# Patient Record
Sex: Male | Born: 1965 | Race: Black or African American | Hispanic: No | Marital: Married | Smoking: Never smoker
Health system: Southern US, Community
[De-identification: ages and names within clinical notes are randomized; demographics above are authoritative.]

## PROBLEM LIST (undated history)

## (undated) VITALS — BP 123/91 | HR 86 | Temp 98.5°F | Resp 14 | Ht 67.0 in | Wt 218.0 lb

## (undated) DIAGNOSIS — L0390 Cellulitis, unspecified: Secondary | ICD-10-CM

## (undated) DIAGNOSIS — F1020 Alcohol dependence, uncomplicated: Secondary | ICD-10-CM

## (undated) DIAGNOSIS — F32A Depression, unspecified: Secondary | ICD-10-CM

## (undated) DIAGNOSIS — R000 Tachycardia, unspecified: Secondary | ICD-10-CM

## (undated) DIAGNOSIS — Z973 Presence of spectacles and contact lenses: Secondary | ICD-10-CM

## (undated) DIAGNOSIS — G4730 Sleep apnea, unspecified: Secondary | ICD-10-CM

## (undated) DIAGNOSIS — F1410 Cocaine abuse, uncomplicated: Secondary | ICD-10-CM

## (undated) DIAGNOSIS — E785 Hyperlipidemia, unspecified: Secondary | ICD-10-CM

## (undated) DIAGNOSIS — E119 Type 2 diabetes mellitus without complications: Secondary | ICD-10-CM

## (undated) DIAGNOSIS — R45851 Suicidal ideations: Secondary | ICD-10-CM

## (undated) DIAGNOSIS — I10 Essential (primary) hypertension: Secondary | ICD-10-CM

## (undated) DIAGNOSIS — N471 Phimosis: Secondary | ICD-10-CM

## (undated) DIAGNOSIS — G4733 Obstructive sleep apnea (adult) (pediatric): Secondary | ICD-10-CM

## (undated) DIAGNOSIS — N289 Disorder of kidney and ureter, unspecified: Secondary | ICD-10-CM

## (undated) DIAGNOSIS — Z8659 Personal history of other mental and behavioral disorders: Secondary | ICD-10-CM

## (undated) DIAGNOSIS — H6993 Unspecified Eustachian tube disorder, bilateral: Secondary | ICD-10-CM

## (undated) DIAGNOSIS — K219 Gastro-esophageal reflux disease without esophagitis: Secondary | ICD-10-CM

## (undated) DIAGNOSIS — I700 Atherosclerosis of aorta: Secondary | ICD-10-CM

## (undated) DIAGNOSIS — N401 Enlarged prostate with lower urinary tract symptoms: Secondary | ICD-10-CM

## (undated) DIAGNOSIS — C61 Malignant neoplasm of prostate: Secondary | ICD-10-CM

## (undated) DIAGNOSIS — N492 Inflammatory disorders of scrotum: Secondary | ICD-10-CM

## (undated) DIAGNOSIS — N3946 Mixed incontinence: Secondary | ICD-10-CM

## (undated) DIAGNOSIS — F1414 Cocaine abuse with cocaine-induced mood disorder: Secondary | ICD-10-CM

## (undated) DIAGNOSIS — E7800 Pure hypercholesterolemia, unspecified: Secondary | ICD-10-CM

## (undated) DIAGNOSIS — F329 Major depressive disorder, single episode, unspecified: Secondary | ICD-10-CM

## (undated) HISTORY — PX: TONSILLECTOMY: SUR1361

## (undated) HISTORY — PX: PROSTATE BIOPSY: SHX241

---

## 2011-08-19 ENCOUNTER — Encounter (HOSPITAL_COMMUNITY): Payer: Self-pay | Admitting: *Deleted

## 2011-08-19 ENCOUNTER — Observation Stay (HOSPITAL_COMMUNITY)
Admission: EM | Admit: 2011-08-19 | Discharge: 2011-08-20 | Disposition: A | Discharge status: Home Health | Payer: Self-pay | Attending: Emergency Medicine | Admitting: Emergency Medicine

## 2011-08-19 DIAGNOSIS — G4730 Sleep apnea, unspecified: Secondary | ICD-10-CM | POA: Insufficient documentation

## 2011-08-19 DIAGNOSIS — Z8669 Personal history of other diseases of the nervous system and sense organs: Secondary | ICD-10-CM

## 2011-08-19 DIAGNOSIS — E119 Type 2 diabetes mellitus without complications: Principal | ICD-10-CM | POA: Insufficient documentation

## 2011-08-19 DIAGNOSIS — R739 Hyperglycemia, unspecified: Secondary | ICD-10-CM

## 2011-08-19 HISTORY — DX: Sleep apnea, unspecified: G47.30

## 2011-08-19 LAB — POCT I-STAT, CHEM 8
BUN: 10 mg/dL (ref 6–23)
Calcium, Ion: 1.12 mmol/L (ref 1.12–1.23)
Chloride: 94 mEq/L — ABNORMAL LOW (ref 96–112)
Glucose, Bld: 355 mg/dL — ABNORMAL HIGH (ref 70–99)
Potassium: 4.3 mEq/L (ref 3.5–5.1)

## 2011-08-19 LAB — CBC WITH DIFFERENTIAL/PLATELET
Basophils Relative: 0 % (ref 0–1)
HCT: 43.6 % (ref 39.0–52.0)
Hemoglobin: 15.3 g/dL (ref 13.0–17.0)
Lymphocytes Relative: 34 % (ref 12–46)
Lymphs Abs: 2.6 10*3/uL (ref 0.7–4.0)
MCHC: 35.1 g/dL (ref 30.0–36.0)
Monocytes Absolute: 0.8 10*3/uL (ref 0.1–1.0)
Monocytes Relative: 10 % (ref 3–12)
Neutro Abs: 4.1 10*3/uL (ref 1.7–7.7)
RBC: 5.1 MIL/uL (ref 4.22–5.81)

## 2011-08-19 LAB — BASIC METABOLIC PANEL
BUN: 9 mg/dL (ref 6–23)
CO2: 29 mEq/L (ref 19–32)
Chloride: 92 mEq/L — ABNORMAL LOW (ref 96–112)
Creatinine, Ser: 1.02 mg/dL (ref 0.50–1.35)
Glucose, Bld: 378 mg/dL — ABNORMAL HIGH (ref 70–99)

## 2011-08-19 LAB — GLUCOSE, CAPILLARY

## 2011-08-19 NOTE — ED Notes (Signed)
Patient updated on status.  Apology for wait.

## 2011-08-19 NOTE — ED Notes (Addendum)
Pt states he has been out of his Novolog medications for the past 2 and a half weeks. Pt states that he was fighting a cold since about that long too. Pt states that he was unable to check CBG's at home and does not have a PCP to refill meds.

## 2011-08-20 LAB — URINALYSIS, ROUTINE W REFLEX MICROSCOPIC
Bilirubin Urine: NEGATIVE
Glucose, UA: >1000 mg/dL — AB
Ketones, ur: 40 mg/dL — AB
Specific Gravity, Urine: 1.041 — ABNORMAL HIGH (ref 1.005–1.030)
pH: 6 (ref 5.0–8.0)

## 2011-08-20 LAB — GLUCOSE, CAPILLARY
Glucose-Capillary: 337 mg/dL — ABNORMAL HIGH (ref 70–99)
Glucose-Capillary: 185 mg/dL — ABNORMAL HIGH (ref 70–99)

## 2011-08-20 LAB — URINE MICROSCOPIC-ADD ON

## 2011-08-20 MED ORDER — SODIUM CHLORIDE 0.9 % IV SOLN
1000.0000 mL | Freq: Once | INTRAVENOUS | Status: AC
  Administered 2011-08-20: 1000 mL via INTRAVENOUS

## 2011-08-20 MED ORDER — DEXTROSE-NACL 5-0.45 % IV SOLN: INTRAVENOUS | Status: DC

## 2011-08-20 MED ORDER — SODIUM CHLORIDE 0.9 % IV SOLN
1000.0000 mL | INTRAVENOUS | Status: DC
  Administered 2011-08-20: 1000 mL via INTRAVENOUS

## 2011-08-20 MED ORDER — SODIUM CHLORIDE 0.9 % IV SOLN
INTRAVENOUS | Status: DC
  Administered 2011-08-20: 6 [IU]/h via INTRAVENOUS
  Filled 2011-08-20: qty 1

## 2011-08-20 MED ORDER — SODIUM CHLORIDE 0.9 % IV BOLUS (SEPSIS)
1000.0000 mL | Freq: Once | INTRAVENOUS | Status: AC
  Administered 2011-08-20: 1000 mL via INTRAVENOUS

## 2011-08-20 MED ORDER — INSULIN REGULAR BOLUS VIA INFUSION
0.0000 [IU] | Freq: Three times a day (TID) | INTRAVENOUS | Status: DC
  Filled 2011-08-20: qty 10

## 2011-08-20 MED ORDER — DEXTROSE 50 % IV SOLN: 25.0000 mL | INTRAVENOUS | Status: DC | PRN

## 2011-08-20 MED ORDER — INSULIN ASPART 100 UNIT/ML ~~LOC~~ SOLN: SUBCUTANEOUS | Status: DC

## 2011-08-20 MED ORDER — POTASSIUM CHLORIDE CRYS ER 20 MEQ PO TBCR
20.0000 meq | EXTENDED_RELEASE_TABLET | Freq: Two times a day (BID) | ORAL | Status: DC
  Administered 2011-08-20 (×2): 20 meq via ORAL
  Filled 2011-08-20 (×2): qty 1

## 2011-08-20 MED ORDER — SODIUM CHLORIDE 0.9 % IV SOLN: INTRAVENOUS | Status: DC

## 2011-08-20 MED ORDER — SODIUM CHLORIDE 0.9 % IV SOLN
INTRAVENOUS | Status: DC
  Filled 2011-08-20: qty 1

## 2011-08-20 MED ORDER — IBUPROFEN 800 MG PO TABS
800.0000 mg | ORAL_TABLET | Freq: Once | ORAL | Status: AC
  Administered 2011-08-20: 800 mg via ORAL

## 2011-08-20 MED ORDER — IBUPROFEN 800 MG PO TABS
ORAL_TABLET | ORAL | Status: AC
  Filled 2011-08-20: qty 1

## 2011-08-20 NOTE — ED Notes (Signed)
CBG at 204.  Case worker at bedside

## 2011-08-20 NOTE — ED Notes (Signed)
Case worker submitting RX to pharmacy for 3 day supply of insulin for pt

## 2011-08-20 NOTE — ED Notes (Signed)
Pt moved to CDU # 10.  Family member at bedside.  Pt denies any pain. Awaiting case management to assist with RX. Pt drinking water without difficulty

## 2011-08-20 NOTE — ED Notes (Signed)
Pt unable to void 

## 2011-08-20 NOTE — ED Notes (Signed)
Family at bedside. 

## 2011-08-20 NOTE — ED Notes (Signed)
Pt amb to BR to wash face and clean up.  States he feels good. Ate 60% of breakfast

## 2011-08-20 NOTE — ED Notes (Signed)
Patient here for elevated blood sugar.  Patient states that he also has some pain around the head of his penis.  Patient has swelling to area.

## 2011-08-20 NOTE — ED Notes (Signed)
Meal given to patient and family member while awaiting consult

## 2011-08-20 NOTE — Progress Notes (Signed)
   CARE MANAGEMENT NOTE 08/20/2011  Patient:  Jonathan Austin, Jonathan Austin   Account Number:  [id number]  Date Initiated:  08/20/2011  Documentation initiated by:  [facility]  Subjective/Objective Assessment:   DM     Action/Plan:   Anticipated DC Date:  08/20/2011   Anticipated DC Plan:  HOME/SELF CARE      DC Planning Services  CM consult  [facility]  Medication Assistance      Choice offered to / List presented to:             Status of service:  Completed, signed off Medicare Important Message given?   (If response is "NO", the following Medicare IM given date fields will be blank) Date Medicare IM given:   Date Additional Medicare IM given:    Discharge Disposition:  HOME/SELF CARE  Per UR Regulation:    If discussed at Long Length of Stay Meetings, dates discussed:    Comments:  08/20/2011 0930 Spoke to pt and states he is self-employed. Was released from prison in 2011 and currently does not have any insurance coverage. 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 He has glucometer at home that was provided to him while incarcerated. He does not have a primary care physician at this time. Provided pt with community resources such as [facility], [employer], and [employer]. Gave him a community discount card that may assist with meds he pays for out of pocket. Pt states he had CPAP while in prison for OSA, but it belong to the state so he had to leave device. Explained that once he is established with PCP, they will make appropriate referrals to help. Encouraged him to schedule appt with [facility] as soon as possible to prevent in relapses in care and compliance with meds. Pt verbalized understanding and stated he will follow up. Isidoro Donning RN CCM Case Mgmt phone [phone]

## 2011-08-20 NOTE — ED Provider Notes (Signed)
History     CSN: [id number]  Arrival date & time 08/19/11  1900   First MD Initiated Contact with Patient 08/20/11 0009      Chief Complaint  Patient presents with  . Hyperglycemia    (Consider location/radiation/quality/duration/timing/severity/associated sxs/prior treatment) HPI History provided by patient. Has history of insulin-dependent diabetes. Was incarcerated and since that time has not had a primary care physician or access to refills on his medications. He is ran out of NovoLog about 2 weeks ago and presents requesting a prescription. He is unable to afford medications and is asking for any assistance that he can receive. No fevers or chills. No nausea vomiting or diarrhea. Had some cough and cold last week but feels like that is getting better. No difficulty breathing. No chest pain. No abdominal pain. Blood sugars have become elevated. Moderate severity. Has not tried to contact any clinics to establish primary care. Past Medical History  Diagnosis Date  . Diabetes mellitus   . Sleep apnea     History reviewed. No pertinent past surgical history.  History reviewed. No pertinent family history.  History  Substance Use Topics  . Smoking status: Never Smoker   . Smokeless tobacco: Not on file  . Alcohol Use: Yes      Review of Systems  Constitutional: Negative for fever and chills.  HENT: Negative for neck pain and neck stiffness.   Eyes: Negative for pain.  Respiratory: Negative for shortness of breath and wheezing.   Cardiovascular: Negative for chest pain.  Gastrointestinal: Negative for abdominal pain.  Genitourinary: Negative for dysuria.  Musculoskeletal: Negative for back pain.  Skin: Negative for rash.  Neurological: Negative for headaches.  All other systems reviewed and are negative.    Allergies  Review of patient's allergies indicates no known allergies.  Home Medications   Current Outpatient Rx  Name Route Sig Dispense Refill  . INSULIN  ASPART 100 UNIT/ML [place] SOLN  Use sliding scale as directed 1 vial 12    BP 131/75  Pulse 101  Temp 97 F (36.1 C) (Oral)  Resp 18  SpO2 95%  Physical Exam  Constitutional: He is oriented to person, place, and time. He appears well-developed and well-nourished.  HENT:  Head: Normocephalic and atraumatic.  Eyes: Conjunctivae and EOM are normal. Pupils are equal, round, and reactive to light.  Neck: Trachea normal. Neck supple. No thyromegaly present.  Cardiovascular: Normal rate, regular rhythm, S1 normal, S2 normal and normal pulses.     No systolic murmur is present   No diastolic murmur is present  Pulses:      Radial pulses are 2+ on the right side, and 2+ on the left side.  Pulmonary/Chest: Effort normal and breath sounds normal. He has no wheezes. He has no rhonchi. He has no rales. He exhibits no tenderness.  Abdominal: Soft. Normal appearance and bowel sounds are normal. There is no tenderness. There is no CVA tenderness and negative Murphy's sign.  Musculoskeletal:       BLE:s Calves nontender, no cords or erythema, negative Homans sign  Neurological: He is alert and oriented to person, place, and time. He has normal strength. No cranial nerve deficit or sensory deficit. GCS eye subscore is 4. GCS verbal subscore is 5. GCS motor subscore is 6.  Skin: Skin is warm and dry. No rash noted. He is not diaphoretic.  Psychiatric: His speech is normal.       Cooperative and appropriate    ED Course  Procedures (  including critical care time)  Labs Reviewed  GLUCOSE, CAPILLARY - Abnormal; Notable for the following:    Glucose-Capillary 369 (*)     All other components within normal limits  BASIC METABOLIC PANEL - Abnormal; Notable for the following:    Sodium 132 (*)     Chloride 92 (*)     Glucose, Bld 378 (*)     GFR calc non Af Amer 86 (*)     All other components within normal limits  URINALYSIS, ROUTINE W REFLEX MICROSCOPIC - Abnormal; Notable for the following:     Specific Gravity, Urine 1.041 (*)     Glucose, UA >1000 (*)     Ketones, ur 40 (*)     All other components within normal limits  POCT I-STAT, CHEM 8 - Abnormal; Notable for the following:    Sodium 133 (*)     Chloride 94 (*)     Glucose, Bld 355 (*)     All other components within normal limits  GLUCOSE, CAPILLARY - Abnormal; Notable for the following:    Glucose-Capillary 337 (*)     All other components within normal limits  GLUCOSE, CAPILLARY - Abnormal; Notable for the following:    Glucose-Capillary 268 (*)     All other components within normal limits  GLUCOSE, CAPILLARY - Abnormal; Notable for the following:    Glucose-Capillary 219 (*)     All other components within normal limits  GLUCOSE, CAPILLARY - Abnormal; Notable for the following:    Glucose-Capillary 185 (*)     All other components within normal limits  URINE MICROSCOPIC-ADD ON - Abnormal; Notable for the following:    Squamous Epithelial / LPF FEW (*)     All other components within normal limits  CBC WITH DIFFERENTIAL   IV insulin and IV fluids provided to normalize blood sugar. Labs obtained and reviewed as above.  Plan social work consult at 8 AM for referrals and Rx assistance. Patient given resource guide and states understanding that he needs to call clinics to establish primary care. MDM   Hyperglycemia due to ran out of insulin. Rx provided and social work consult for medication assist. No indication for admission or further workup in the emergency department at this time. Nursing notes reviewed. Vital signs reviewed with normalizing heart rate.        Sunnie Nielsen, MD 08/20/11 [phone]

## 2011-08-20 NOTE — ED Notes (Signed)
Pt ride home has arrived and is sitting with patient.  Awaiting RX from [employer]

## 2011-08-20 NOTE — ED Notes (Signed)
Case worker called to say that she is on her way to see patient.

## 2011-08-20 NOTE — ED Notes (Signed)
Report received from Diona Foley, RN

## 2011-08-23 ENCOUNTER — Emergency Department (HOSPITAL_COMMUNITY)
Admission: EM | Admit: 2011-08-23 | Discharge: 2011-08-24 | Disposition: A | Discharge status: Home / Self Care | Payer: Self-pay | Attending: Emergency Medicine | Admitting: Emergency Medicine

## 2011-08-23 ENCOUNTER — Encounter (HOSPITAL_COMMUNITY): Payer: Self-pay | Admitting: Emergency Medicine

## 2011-08-23 DIAGNOSIS — N476 Balanoposthitis: Secondary | ICD-10-CM | POA: Insufficient documentation

## 2011-08-23 DIAGNOSIS — E119 Type 2 diabetes mellitus without complications: Secondary | ICD-10-CM | POA: Insufficient documentation

## 2011-08-23 DIAGNOSIS — G4730 Sleep apnea, unspecified: Secondary | ICD-10-CM | POA: Insufficient documentation

## 2011-08-23 DIAGNOSIS — N390 Urinary tract infection, site not specified: Secondary | ICD-10-CM

## 2011-08-23 DIAGNOSIS — R739 Hyperglycemia, unspecified: Secondary | ICD-10-CM

## 2011-08-23 DIAGNOSIS — Z794 Long term (current) use of insulin: Secondary | ICD-10-CM | POA: Insufficient documentation

## 2011-08-23 LAB — CBC WITH DIFFERENTIAL/PLATELET
Basophils Absolute: 0.1 10*3/uL (ref 0.0–0.1)
Basophils Relative: 1 % (ref 0–1)
Eosinophils Absolute: 0.1 10*3/uL (ref 0.0–0.7)
HCT: 41.9 % (ref 39.0–52.0)
Hemoglobin: 14.4 g/dL (ref 13.0–17.0)
Lymphocytes Relative: 25 % (ref 12–46)
MCHC: 34.4 g/dL (ref 30.0–36.0)
Monocytes Relative: 9 % (ref 3–12)
Neutro Abs: 5.1 10*3/uL (ref 1.7–7.7)
Neutrophils Relative %: 64 % (ref 43–77)
RDW: 13.6 % (ref 11.5–15.5)
WBC: 8 10*3/uL (ref 4.0–10.5)

## 2011-08-23 LAB — POCT I-STAT, CHEM 8
Chloride: 101 mEq/L (ref 96–112)
Glucose, Bld: 340 mg/dL — ABNORMAL HIGH (ref 70–99)
HCT: 45 % (ref 39.0–52.0)
Hemoglobin: 15.3 g/dL (ref 13.0–17.0)
Potassium: 4.5 mEq/L (ref 3.5–5.1)
Sodium: 138 mEq/L (ref 135–145)

## 2011-08-23 LAB — URINE MICROSCOPIC-ADD ON

## 2011-08-23 LAB — URINALYSIS, ROUTINE W REFLEX MICROSCOPIC
Glucose, UA: >1000 mg/dL — AB
Nitrite: NEGATIVE
pH: 6 (ref 5.0–8.0)

## 2011-08-23 LAB — GLUCOSE, CAPILLARY: Glucose-Capillary: 285 mg/dL — ABNORMAL HIGH (ref 70–99)

## 2011-08-23 MED ORDER — INSULIN ASPART 100 UNIT/ML ~~LOC~~ SOLN: 5.0000 [IU] | Freq: Once | SUBCUTANEOUS | Status: DC

## 2011-08-23 MED ORDER — INSULIN ASPART 100 UNIT/ML ~~LOC~~ SOLN
4.0000 [IU] | Freq: Once | SUBCUTANEOUS | Status: AC
  Administered 2011-08-24: 4 [IU] via INTRAVENOUS

## 2011-08-23 MED ORDER — SODIUM CHLORIDE 0.9 % IV BOLUS (SEPSIS)
1000.0000 mL | Freq: Once | INTRAVENOUS | Status: AC
  Administered 2011-08-24: 1000 mL via INTRAVENOUS

## 2011-08-23 MED ORDER — DEXTROSE 5 % IV SOLN
1.0000 g | Freq: Once | INTRAVENOUS | Status: AC
  Administered 2011-08-24: 1 g via INTRAVENOUS
  Filled 2011-08-23: qty 10

## 2011-08-23 MED ORDER — ACYCLOVIR 200 MG PO CAPS
800.0000 mg | ORAL_CAPSULE | Freq: Once | ORAL | Status: AC
  Administered 2011-08-24: 800 mg via ORAL
  Filled 2011-08-23: qty 4

## 2011-08-23 NOTE — ED Provider Notes (Addendum)
History     CSN: [id number]  Arrival date & time 08/23/11  2156   First MD Initiated Contact with Patient 08/23/11 2317      Chief Complaint  Patient presents with  . Dysuria    (Consider location/radiation/quality/duration/timing/severity/associated sxs/prior treatment) Patient is a 46 y.o. male presenting with dysuria. The history is provided by the patient.  Dysuria  This is a new problem. The current episode started yesterday. The problem occurs every urination. The problem has not changed since onset.The quality of the pain is described as burning. The pain is at a severity of 6/10. The pain is mild. There has been no fever. Past medical history comments: diabetes.    Past Medical History  Diagnosis Date  . Diabetes mellitus   . Sleep apnea     Past Surgical History  Procedure Date  . Tonsillectomy     History reviewed. No pertinent family history.  History  Substance Use Topics  . Smoking status: Never Smoker   . Smokeless tobacco: Not on file  . Alcohol Use: Yes      Review of Systems  Genitourinary: Positive for dysuria.  All other systems reviewed and are negative.    Allergies  Review of patient's allergies indicates no known allergies.  Home Medications   Current Outpatient Rx  Name Route Sig Dispense Refill  . IBUPROFEN 200 MG PO TABS Oral Take 400 mg by mouth every 8 (eight) hours as needed. For pain.    . INSULIN ASPART 100 UNIT/ML [place] SOLN Subcutaneous Inject 4-12 Units into the skin 2 (two) times daily before a meal.      BP 153/88  Pulse 115  Temp 97.8 F (36.6 C) (Oral)  Resp 20  SpO2 96%  Physical Exam  Constitutional: He is oriented to person, place, and time. He appears well-developed and well-nourished.  HENT:  Head: Normocephalic and atraumatic.  Eyes: Conjunctivae are normal. Pupils are equal, round, and reactive to light.  Neck: Normal range of motion. Neck supple.  Cardiovascular: Normal rate, regular rhythm, normal  heart sounds and intact distal pulses.   Pulmonary/Chest: Effort normal and breath sounds normal.  Abdominal: Soft. Bowel sounds are normal.  Genitourinary:          Pt with mild balanitis,  Able to retract foreskin. Also ulcerations,  vesicular in nature noted.  No swelling,  No crepitus,  No scrotal involvement  Neurological: He is alert and oriented to person, place, and time.  Skin: Skin is warm and dry.  Psychiatric: He has a normal mood and affect. His behavior is normal. Judgment and thought content normal.    ED Course  Procedures (including critical care time)  Labs Reviewed  GLUCOSE, CAPILLARY - Abnormal; Notable for the following:    Glucose-Capillary 285 (*)     All other components within normal limits  URINALYSIS, ROUTINE W REFLEX MICROSCOPIC - Abnormal; Notable for the following:    APPearance CLOUDY (*)     Glucose, UA >1000 (*)     Hgb urine dipstick SMALL (*)     Leukocytes, UA MODERATE (*)     All other components within normal limits  POCT I-STAT, CHEM 8 - Abnormal; Notable for the following:    Glucose, Bld 340 (*)     All other components within normal limits  CBC WITH DIFFERENTIAL  URINE MICROSCOPIC-ADD ON   No results found.   No diagnosis found.    MDM  PT with balanitis.  Also ulcerations.  Not  fourniers.  Possible uti with erosion vs herpes outbreak.  No dka.  Will treat sugar.  Empiric abx, urine culture, antiviral.  Established with case management outpt fu.  Urology for possible circumcision as outpt        Stepen Prins Lytle Michaels, MD 08/23/11 2354  Chanele Douglas Lytle Michaels, MD 08/24/11 1610

## 2011-08-23 NOTE — ED Notes (Addendum)
Reports that when blood sugar, he gets real dehydrated and when that happens- he is not circumcised and the top of his penis gets tight and blisters and has pus under foreskin; pt reports normally goes away , but this time has not; reports pain with urination as well

## 2011-08-23 NOTE — ED Notes (Signed)
CBG checked 285

## 2011-08-24 LAB — GLUCOSE, CAPILLARY: Glucose-Capillary: 264 mg/dL — ABNORMAL HIGH (ref 70–99)

## 2011-08-24 MED ORDER — INSULIN ASPART 100 UNIT/ML ~~LOC~~ SOLN
SUBCUTANEOUS | Status: AC
  Filled 2011-08-24: qty 1

## 2011-08-24 MED ORDER — MORPHINE SULFATE 4 MG/ML IJ SOLN
INTRAMUSCULAR | Status: AC
  Filled 2011-08-24: qty 1

## 2011-08-24 MED ORDER — ONDANSETRON HCL 4 MG/2ML IJ SOLN
INTRAMUSCULAR | Status: AC
  Filled 2011-08-24: qty 2

## 2011-08-24 MED ORDER — MORPHINE SULFATE 4 MG/ML IJ SOLN
4.0000 mg | Freq: Once | INTRAMUSCULAR | Status: AC
  Administered 2011-08-24: 4 mg via INTRAVENOUS

## 2011-08-24 MED ORDER — ONDANSETRON HCL 4 MG/2ML IJ SOLN
4.0000 mg | Freq: Once | INTRAMUSCULAR | Status: AC
  Administered 2011-08-24: 4 mg via INTRAVENOUS

## 2011-08-24 MED ORDER — CIPROFLOXACIN HCL 500 MG PO TABS: 500.0000 mg | ORAL_TABLET | Freq: Two times a day (BID) | ORAL | Status: AC

## 2011-08-24 MED ORDER — HYDROCODONE-ACETAMINOPHEN 5-500 MG PO TABS: 1.0000 | ORAL_TABLET | Freq: Four times a day (QID) | ORAL | Status: AC | PRN

## 2011-08-24 MED ORDER — ACYCLOVIR 400 MG PO TABS: 400.0000 mg | ORAL_TABLET | Freq: Four times a day (QID) | ORAL | Status: AC

## 2011-08-24 MED FILL — Insulin Aspart Inj 100 Unit/ML: SUBCUTANEOUS | Qty: 0.04 | Status: AC

## 2011-08-24 NOTE — ED Notes (Signed)
CBG completed 

## 2012-08-02 ENCOUNTER — Emergency Department (HOSPITAL_COMMUNITY): Payer: 59

## 2012-08-02 ENCOUNTER — Encounter (HOSPITAL_COMMUNITY): Payer: Self-pay | Admitting: *Deleted

## 2012-08-02 ENCOUNTER — Emergency Department (HOSPITAL_COMMUNITY)
Admission: EM | Admit: 2012-08-02 | Discharge: 2012-08-02 | Disposition: A | Discharge status: Home / Self Care | Payer: 59 | Attending: Emergency Medicine | Admitting: Emergency Medicine

## 2012-08-02 DIAGNOSIS — R5383 Other fatigue: Secondary | ICD-10-CM | POA: Insufficient documentation

## 2012-08-02 DIAGNOSIS — R3589 Other polyuria: Secondary | ICD-10-CM | POA: Insufficient documentation

## 2012-08-02 DIAGNOSIS — E1169 Type 2 diabetes mellitus with other specified complication: Secondary | ICD-10-CM | POA: Insufficient documentation

## 2012-08-02 DIAGNOSIS — R5381 Other malaise: Secondary | ICD-10-CM | POA: Insufficient documentation

## 2012-08-02 DIAGNOSIS — R358 Other polyuria: Secondary | ICD-10-CM | POA: Insufficient documentation

## 2012-08-02 DIAGNOSIS — Z79899 Other long term (current) drug therapy: Secondary | ICD-10-CM | POA: Insufficient documentation

## 2012-08-02 DIAGNOSIS — F1410 Cocaine abuse, uncomplicated: Secondary | ICD-10-CM

## 2012-08-02 DIAGNOSIS — J40 Bronchitis, not specified as acute or chronic: Secondary | ICD-10-CM

## 2012-08-02 DIAGNOSIS — R05 Cough: Secondary | ICD-10-CM | POA: Insufficient documentation

## 2012-08-02 DIAGNOSIS — R739 Hyperglycemia, unspecified: Secondary | ICD-10-CM

## 2012-08-02 DIAGNOSIS — R631 Polydipsia: Secondary | ICD-10-CM | POA: Insufficient documentation

## 2012-08-02 DIAGNOSIS — Z794 Long term (current) use of insulin: Secondary | ICD-10-CM | POA: Insufficient documentation

## 2012-08-02 DIAGNOSIS — R059 Cough, unspecified: Secondary | ICD-10-CM | POA: Insufficient documentation

## 2012-08-02 LAB — URINE MICROSCOPIC-ADD ON

## 2012-08-02 LAB — COMPREHENSIVE METABOLIC PANEL
ALT: 19 U/L (ref 0–53)
Albumin: 3.6 g/dL (ref 3.5–5.2)
Alkaline Phosphatase: 65 U/L (ref 39–117)
Potassium: 4.8 mEq/L (ref 3.5–5.1)
Sodium: 135 mEq/L (ref 135–145)
Total Protein: 7.6 g/dL (ref 6.0–8.3)

## 2012-08-02 LAB — CBC
MCHC: 35.5 g/dL (ref 30.0–36.0)
Platelets: 328 10*3/uL (ref 150–400)
RDW: 12.8 % (ref 11.5–15.5)

## 2012-08-02 LAB — URINALYSIS, ROUTINE W REFLEX MICROSCOPIC
Bilirubin Urine: NEGATIVE
Glucose, UA: >1000 mg/dL — AB
Ketones, ur: 15 mg/dL — AB
Leukocytes, UA: NEGATIVE
pH: 6 (ref 5.0–8.0)

## 2012-08-02 LAB — GLUCOSE, CAPILLARY: Glucose-Capillary: 251 mg/dL — ABNORMAL HIGH (ref 70–99)

## 2012-08-02 MED ORDER — METFORMIN HCL 500 MG PO TABS: 500.0000 mg | ORAL_TABLET | Freq: Two times a day (BID) | ORAL | Status: DC

## 2012-08-02 MED ORDER — SODIUM CHLORIDE 0.9 % IV BOLUS (SEPSIS)
1000.0000 mL | Freq: Once | INTRAVENOUS | Status: AC
  Administered 2012-08-02: 1000 mL via INTRAVENOUS

## 2012-08-02 MED ORDER — ALBUTEROL SULFATE HFA 108 (90 BASE) MCG/ACT IN AERS: 2.0000 | INHALATION_SPRAY | RESPIRATORY_TRACT | Status: DC | PRN

## 2012-08-02 MED ORDER — AZITHROMYCIN 250 MG PO TABS: ORAL_TABLET | ORAL | Status: DC

## 2012-08-02 MED ORDER — INSULIN ASPART 100 UNIT/ML ~~LOC~~ SOLN
5.0000 [IU] | Freq: Once | SUBCUTANEOUS | Status: AC
  Administered 2012-08-02: 5 [IU] via SUBCUTANEOUS

## 2012-08-02 MED ORDER — GLIPIZIDE 10 MG PO TABS: 10.0000 mg | ORAL_TABLET | Freq: Two times a day (BID) | ORAL | Status: DC

## 2012-08-02 NOTE — ED Provider Notes (Signed)
History    CSN: [id number] Arrival date & time 08/02/12  1713  First MD Initiated Contact with Patient 08/02/12 1810     Chief Complaint  Patient presents with  . Hyperglycemia   (Consider location/radiation/quality/duration/timing/severity/associated sxs/prior Treatment) HPI Comments: Patient presents to ER for evaluation of elevated blood sugar. Patient reports that his sugar has been running high for a while. He has been weak, experiencing increased urination and thirst. Patient has not had any recent illness other than a cough that was productive of dark sputum at times. He has not had fever. No vomiting or diarrhea. Patient reports that he has not had any recent medication changes or missed any doses. He has, however, been smoking crack regularly. He would like help with this.  Patient is a 47 y.o. male presenting with hyperglycemia.  Hyperglycemia Associated symptoms: fatigue, increased thirst and polyuria   Associated symptoms: no chest pain    Past Medical History  Diagnosis Date  . Diabetes mellitus   . Sleep apnea    Past Surgical History  Procedure Laterality Date  . Tonsillectomy     History reviewed. No pertinent family history. History  Substance Use Topics  . Smoking status: Never Smoker   . Smokeless tobacco: Not on file  . Alcohol Use: Yes    Review of Systems  Constitutional: Positive for fatigue.  Respiratory: Positive for cough.   Cardiovascular: Negative for chest pain.  Gastrointestinal: Negative.   Endocrine: Positive for polydipsia and polyuria.  All other systems reviewed and are negative.    Allergies  Review of patient's allergies indicates no known allergies.  Home Medications   Current Outpatient Rx  Name  Route  Sig  Dispense  Refill  . glipiZIDE (GLUCOTROL) 10 MG tablet   Oral   Take 10 mg by mouth 2 (two) times daily before a meal.         . insulin aspart (NOVOLOG) 100 UNIT/ML injection   Subcutaneous   Inject 4-12 Units  into the skin 2 (two) times daily before a meal. Sliding scale         . metFORMIN (GLUCOPHAGE) 500 MG tablet   Oral   Take 500 mg by mouth 2 (two) times daily with a meal.          BP 156/111  Pulse 122  Temp(Src) 98.3 F (36.8 C) (Oral)  Resp 20  SpO2 95% Physical Exam  Constitutional: He is oriented to person, place, and time. He appears well-developed and well-nourished. No distress.  HENT:  Head: Normocephalic and atraumatic.  Right Ear: Hearing normal.  Left Ear: Hearing normal.  Nose: Nose normal.  Mouth/Throat: Oropharynx is clear and moist and mucous membranes are normal.  Eyes: Conjunctivae and EOM are normal. Pupils are equal, round, and reactive to light.  Neck: Normal range of motion. Neck supple.  Cardiovascular: Regular rhythm, S1 normal and S2 normal.  Exam reveals no gallop and no friction rub.   No murmur heard. Pulmonary/Chest: Effort normal and breath sounds normal. No respiratory distress. He exhibits no tenderness.  Abdominal: Soft. Normal appearance and bowel sounds are normal. There is no hepatosplenomegaly. There is no tenderness. There is no rebound, no guarding, no tenderness at McBurney's point and negative Murphy's sign. No hernia.  Musculoskeletal: Normal range of motion.  Neurological: He is alert and oriented to person, place, and time. He has normal strength. No cranial nerve deficit or sensory deficit. Coordination normal. GCS eye subscore is 4. GCS verbal subscore is  5. GCS motor subscore is 6.  Skin: Skin is warm, dry and intact. No rash noted. No cyanosis.  Psychiatric: He has a normal mood and affect. His speech is normal and behavior is normal. Thought content normal.    ED Course  Procedures (including critical care time) Labs Reviewed  COMPREHENSIVE METABOLIC PANEL - Abnormal; Notable for the following:    Glucose, Bld 281 (*)    All other components within normal limits  URINALYSIS, ROUTINE W REFLEX MICROSCOPIC - Abnormal; Notable  for the following:    Specific Gravity, Urine 1.039 (*)    Glucose, UA >1000 (*)    Ketones, ur 15 (*)    All other components within normal limits  GLUCOSE, CAPILLARY - Abnormal; Notable for the following:    Glucose-Capillary 314 (*)    All other components within normal limits  URINE MICROSCOPIC-ADD ON - Abnormal; Notable for the following:    Squamous Epithelial / LPF FEW (*)    All other components within normal limits  GLUCOSE, CAPILLARY - Abnormal; Notable for the following:    Glucose-Capillary 251 (*)    All other components within normal limits  CBC   Dg Chest 2 View  08/02/2012   *RADIOLOGY REPORT*  Clinical Data: Cough and congestion for 1 week, history diabetes, smoking  CHEST - 2 VIEW  Comparison: None  Findings: Normal heart size, mediastinal contours, and pulmonary vascularity. Minimal peribronchial thickening and slight hyperinflation. No pulmonary infiltrate, pleural effusion or pneumothorax. Bones unremarkable.  IMPRESSION: Minimal bronchitic changes. No acute infiltrate.   Original Report Authenticated By: Ulyses Southward, M.D.   Diagnosis: 1. Hyperglycemia 2. Bronchitis 3. Cocaine abuse  MDM  Patient presents to the ER for evaluation of elevated pressure. Patient reports that his sugars have been running high for a while. Patient admits that he has been using crack cocaine and this is likely because of his uncontrolled diabetes. He has, however, been experiencing cough with dark sputum production. Chest x-ray does not show any evidence of pneumonia. Patient will require treatment for bronchitis, continued vigilant in sugar checking and treatment with his medications. He is requesting help with his cocaine abuse. Patient seen by ACT team and given options.  Gilda Crease, MD 08/02/12 [phone]

## 2012-08-02 NOTE — BH Assessment (Signed)
Assessment Note   Patient is a 47 year old AA male requesting referrals for outpatient substance abuse therapy.  Patient reports that he is addicted to crack cocaine. Patient reports that his last use was today at  3:00 p.m. when he used 2 grams of cocaine.  Patient reports that he smokes the drug on a daily basis.  Patient reports a past history of inpatient substance abuse detox and treatment in 1991 at [facility] and in 2013 at [facility].  Patient reports sobriety from 2005 to 2011.  Patient denies SI/HI.  Patient reports a past history of SI in 2011.  Patient denies psychosis.  Patient denies any withdrawal symptoms.    Axis I: Cocaine Dependence and Major Depressive Disorder  Axis II: Deferred Axis III:  Past Medical History  Diagnosis Date  . Diabetes mellitus   . Sleep apnea    Axis IV: other psychosocial or environmental problems, problems related to legal system/crime, problems related to social environment and problems with access to health care services Axis V: 41-50 serious symptoms  Past Medical History:  Past Medical History  Diagnosis Date  . Diabetes mellitus   . Sleep apnea     Past Surgical History  Procedure Laterality Date  . Tonsillectomy      Family History: History reviewed. No pertinent family history.  Social History:  reports that he has never smoked. He does not have any smokeless tobacco history on file. He reports that  drinks alcohol. He reports that he uses illicit drugs (Marijuana).  Additional Social History:  Alcohol / Drug Use History of alcohol / drug use?: Yes Longest period of sobriety (when/how long): 2005 to 2011 Negative Consequences of Use: Financial;Legal;Personal relationships Withdrawal Symptoms:  (None Reported ) Substance #1 Name of Substance 1: Crack/Cocaine  1 - Age of First Use: Patient reports that he began using in 1991 1 - Amount (size/oz): 1-2 grams  1 - Frequency: Every other day  1 - Duration: Since 2011 1 - Last  Use / Amount: Today at 3pm  CIWA: CIWA-Ar BP: 141/95 mmHg Pulse Rate: 94 COWS:    Allergies: No Known Allergies  Home Medications:  (Not in a hospital admission)  OB/GYN Status:  No LMP for male patient.  General Assessment Data Location of Assessment: [facility] ED ACT Assessment: Yes Living Arrangements: Alone Can pt return to current living arrangement?: Yes Admission Status: Voluntary Is patient capable of signing voluntary admission?: Yes Transfer from: Acute Hospital Referral Source: Self/Family/Friend  Education Status Is patient currently in school?: No  Risk to self Suicidal Ideation: No Suicidal Intent: No Is patient at risk for suicide?: No Suicidal Plan?: No Access to Means: No What has been your use of drugs/alcohol within the last 12 months?: Crack Cocaine Previous Attempts/Gestures: Yes How many times?: 1 Other Self Harm Risks: None  Triggers for Past Attempts: Family contact;Spouse contact;Unpredictable Intentional Self Injurious Behavior: None Family Suicide History: No Recent stressful life event(s): Conflict (Comment);Financial Problems;Other (Comment) Persecutory voices/beliefs?: No Depression: Yes Depression Symptoms: Tearfulness;Isolating;Fatigue;Loss of interest in usual pleasures;Feeling worthless/self pity Substance abuse history and/or treatment for substance abuse?: Yes Suicide prevention information given to non-admitted patients: Not applicable  Risk to Others Homicidal Ideation: No Thoughts of Harm to Others: No Current Homicidal Intent: No Current Homicidal Plan: No Access to Homicidal Means: No Identified Victim: None History of harm to others?: No Assessment of Violence: None Noted Violent Behavior Description: calm  Does patient have access to weapons?: No Criminal Charges Pending?: Yes Describe  Pending Criminal Charges: conspiracy with intent to deliver drugs. Does patient have a court date: No  Psychosis Hallucinations: None  noted Delusions: None noted  Mental Status Report Appear/Hygiene: Disheveled Eye Contact: Poor Motor Activity: Freedom of movement Speech: Logical/coherent Level of Consciousness: Alert Mood: Depressed Affect: Blunted;Sad;Sullen Anxiety Level: None Thought Processes: Coherent;Relevant Judgement: Unimpaired Orientation: Person;Place;Time;Situation Obsessive Compulsive Thoughts/Behaviors: None  Cognitive Functioning Concentration: Decreased Memory: Recent Impaired;Remote Impaired IQ: Average Insight: Poor Impulse Control: Poor Appetite: Fair Weight Loss: 0 Weight Gain: 0 Sleep: Decreased Total Hours of Sleep: 3 Vegetative Symptoms: None  ADLScreening [facility]) Patient's cognitive ability adequate to safely complete daily activities?: Yes Patient able to express need for assistance with ADLs?: Yes Independently performs ADLs?: Yes (appropriate for developmental age)  Abuse/Neglect [facility]) Physical Abuse: Denies Verbal Abuse: Denies Sexual Abuse: Denies  Prior Inpatient Therapy Prior Inpatient Therapy: No Prior Therapy Dates: na Prior Therapy Facilty/Provider(s): na Reason for Treatment: na  Prior Outpatient Therapy Prior Outpatient Therapy: Yes Prior Therapy Dates: 1991 , 2012, 2013 Prior Therapy Facilty/Provider(s): [facility]  Reason for Treatment: substance abuse and SI  ADL Screening (condition at time of admission) Patient's cognitive ability adequate to safely complete daily activities?: Yes Patient able to express need for assistance with ADLs?: Yes Independently performs ADLs?: Yes (appropriate for developmental age)       Abuse/Neglect Assessment (Assessment to be complete while patient is alone) Physical Abuse: Denies Verbal Abuse: Denies Sexual Abuse: Denies Values / Beliefs Cultural Requests During Hospitalization: None Spiritual Requests During Hospitalization: None        Additional  Information 1:1 In Past 12 Months?: No CIRT Risk: No Elopement Risk: No Does patient have medical clearance?: Yes     Disposition: Discharge with outpatient referrals  Disposition Initial Assessment Completed for this Encounter: Yes Disposition of Patient: Referred to Patient referred to: Other (Comment)  On Site Evaluation by:   Reviewed with Physician:     Phillip Heal LaVerne 08/02/2012 10:35 PM

## 2012-08-02 NOTE — ED Notes (Signed)
Pt reports cbg being >300, pt feels like he is dehydrated. Denies n/v, but reports fatigue and frequent urination. Reports smoking crack and thinks this is also causing productive cough with black sputum. HR 122 at triage.

## 2012-10-02 ENCOUNTER — Ambulatory Visit (HOSPITAL_BASED_OUTPATIENT_CLINIC_OR_DEPARTMENT_OTHER): Payer: 59 | Attending: Family Medicine | Admitting: Radiology

## 2012-10-02 VITALS — Ht 67.0 in | Wt 218.0 lb

## 2012-10-02 DIAGNOSIS — G4733 Obstructive sleep apnea (adult) (pediatric): Secondary | ICD-10-CM | POA: Insufficient documentation

## 2012-10-06 DIAGNOSIS — G4730 Sleep apnea, unspecified: Secondary | ICD-10-CM

## 2012-10-06 DIAGNOSIS — G4710 Hypersomnia, unspecified: Secondary | ICD-10-CM

## 2012-10-07 NOTE — Procedures (Signed)
NAMECRISPIN, Jonathan Austin NO.:  [id number]  MEDICAL RECORD NO.:  [id number]          PATIENT TYPE:  OUT  LOCATION:  SLEEP CENTER                 FACILITY:  [facility]  PHYSICIAN:  Geraldine Tesar D. Maple Hudson, MD, FCCP, FACPDATE OF BIRTH:  Jan 15, 1966  DATE OF STUDY:  10/02/2012                           NOCTURNAL POLYSOMNOGRAM  REFERRING PHYSICIAN:  Joycelyn Austin  INDICATION FOR STUDY:  Hypersomnia with sleep apnea.  EPWORTH SLEEPINESS SCORE:  22/24.  BMI 34.1, weight 218 pounds, height 67 inches, neck 15.5 inches.  MEDICATIONS:  Home medications are charted for review.  SLEEP ARCHITECTURE:  Split-study protocol.  During the diagnostic phase, total sleep time 128.5 minutes with sleep efficiency 89.2%.  Stage I was 16%, stage II 58%, stage III absent.  REM 26.1% of total sleep time. Sleep latency 1 minute, REM latency 73.5 minutes, awake after sleep onset 12.5 minutes.  Arousal index of 68.2.  Bedtime medication:  None.  RESPIRATORY DATA:  Split-study protocol.  Apnea/hypopnea index (AHI) 67.7 per hour.  A total of 145 events was scored including 80 obstructive apneas, 1 central apnea, 62 mixed apneas, 2 hypopneas. Events were mainly associated with supine sleep position.  REM AHI 62.7 per hour.  CPAP was titrated to 15 CWP with residually events and an AHI of 68.6 per hour.  The technician then changed to bilevel within a final inspiratory pressure of 20, and expiratory pressure of 16.  This left a residual AHI of 44.7 per hour, all events being central apneas.  He wore a medium Barista P10 nasal pillows mask with heated humidifier and a chin strap.  OXYGEN DATA:  Severe snoring before CPAP with oxygen desaturation to a nadir of 56% on room air.  With bilevel control, snoring was prevented and mean oxygen saturation held 95.1% on room air.  CARDIAC DATA:  Sinus rhythm with PACs and PVCs and an average heart rate of 98 per minute.  MOVEMENT-PARASOMNIA:  No significant  movement disturbance.  Bathroom x1.  IMPRESSION-RECOMMENDATION: 1. Severe obstructive sleep apnea/hypopnea syndrome, AHI at 67.7 per     hour with mainly supine events.  Loud snoring with oxygen     desaturation to a nadir of 56% on room air. 2. Inadequate control with CPAP titrated to 15 CWP and residual AHI of     17.6 per hour reflecting central apneas.  Bilevel titration to a     final inspiratory pressure of 20, and expiratory pressure of 16,     left residual central apneas, and an AHI of 44.7 per hour, but all     obstructive events and snoring were prevented.  He wore a medium     ResMed AirFit P10 nasal pillows mask with heated humidifier and     chin strap.  Snoring was prevented and mean oxygen saturation held     95.1% on room air.  The clinical significance of untreated central apneas is not always clear.  Often they are thought to reflect some impairment of cerebrovascular perfusion and a delayed feedback response loop.  A clinical trial of bilevel PAP at an inspiratory pressure of 20 and expiratory pressure of 16 can be tried.  If  there is remaining clinical concern then, the patient could return for a dedicated assisted ventilation PAP trial (ASV) study to see if assisted nasal ventilation can suppress the central apneas.     Jonathan Austin D. Maple Hudson, MD, [name], FACP Diplomate, American Board of Sleep Medicine    CDY/MEDQ  D:  10/06/2012 18:48:29  T:  10/07/2012 05:06:17  Job:  [id number]

## 2012-11-26 ENCOUNTER — Telehealth: Payer: Self-pay | Admitting: Radiology

## 2012-11-26 NOTE — Telephone Encounter (Signed)
Phone call from [facility], they need Dr Milus Glazier note and labs, have provided number to his other clinic, patient has not been seen here.

## 2012-11-29 ENCOUNTER — Encounter (INDEPENDENT_AMBULATORY_CARE_PROVIDER_SITE_OTHER): Payer: Self-pay | Admitting: Ophthalmology

## 2012-12-09 ENCOUNTER — Inpatient Hospital Stay (HOSPITAL_COMMUNITY)
Admission: EM | Admit: 2012-12-09 | Discharge: 2012-12-11 | DRG: 195 | Disposition: A | Discharge status: Home / Self Care | Payer: 59 | Attending: Internal Medicine | Admitting: Internal Medicine

## 2012-12-09 ENCOUNTER — Other Ambulatory Visit: Payer: Self-pay

## 2012-12-09 ENCOUNTER — Encounter (HOSPITAL_COMMUNITY): Payer: Self-pay | Admitting: Emergency Medicine

## 2012-12-09 ENCOUNTER — Emergency Department (HOSPITAL_COMMUNITY): Payer: 59

## 2012-12-09 DIAGNOSIS — J189 Pneumonia, unspecified organism: Principal | ICD-10-CM

## 2012-12-09 DIAGNOSIS — E1142 Type 2 diabetes mellitus with diabetic polyneuropathy: Secondary | ICD-10-CM | POA: Diagnosis present

## 2012-12-09 DIAGNOSIS — Z79899 Other long term (current) drug therapy: Secondary | ICD-10-CM

## 2012-12-09 DIAGNOSIS — E119 Type 2 diabetes mellitus without complications: Secondary | ICD-10-CM

## 2012-12-09 DIAGNOSIS — Z23 Encounter for immunization: Secondary | ICD-10-CM

## 2012-12-09 DIAGNOSIS — E1165 Type 2 diabetes mellitus with hyperglycemia: Secondary | ICD-10-CM | POA: Diagnosis present

## 2012-12-09 DIAGNOSIS — Z794 Long term (current) use of insulin: Secondary | ICD-10-CM

## 2012-12-09 DIAGNOSIS — G4730 Sleep apnea, unspecified: Secondary | ICD-10-CM

## 2012-12-09 DIAGNOSIS — F17200 Nicotine dependence, unspecified, uncomplicated: Secondary | ICD-10-CM | POA: Diagnosis present

## 2012-12-09 DIAGNOSIS — G4733 Obstructive sleep apnea (adult) (pediatric): Secondary | ICD-10-CM | POA: Diagnosis present

## 2012-12-09 DIAGNOSIS — E860 Dehydration: Secondary | ICD-10-CM | POA: Diagnosis present

## 2012-12-09 LAB — CBC WITH DIFFERENTIAL/PLATELET
Basophils Absolute: 0 10*3/uL (ref 0.0–0.1)
Basophils Relative: 0 % (ref 0–1)
Eosinophils Absolute: 0.1 10*3/uL (ref 0.0–0.7)
Eosinophils Relative: 1 % (ref 0–5)
HCT: 42.6 % (ref 39.0–52.0)
Lymphocytes Relative: 16 % (ref 12–46)
Lymphs Abs: 1.6 10*3/uL (ref 0.7–4.0)
MCH: 31.1 pg (ref 26.0–34.0)
MCHC: 35.4 g/dL (ref 30.0–36.0)
Monocytes Absolute: 1.2 10*3/uL — ABNORMAL HIGH (ref 0.1–1.0)
Neutro Abs: 6.8 10*3/uL (ref 1.7–7.7)
Neutrophils Relative %: 71 % (ref 43–77)
RBC: 4.85 MIL/uL (ref 4.22–5.81)
RDW: 12.6 % (ref 11.5–15.5)

## 2012-12-09 LAB — CBC
Hemoglobin: 15.4 g/dL (ref 13.0–17.0)
MCH: 30.6 pg (ref 26.0–34.0)
MCH: 30.7 pg (ref 26.0–34.0)
MCHC: 34.5 g/dL (ref 30.0–36.0)
MCHC: 35 g/dL (ref 30.0–36.0)
MCV: 87.7 fL (ref 78.0–100.0)
Platelets: 345 10*3/uL (ref 150–400)
Platelets: 352 10*3/uL (ref 150–400)
RBC: 4.89 MIL/uL (ref 4.22–5.81)
RDW: 12.6 % (ref 11.5–15.5)

## 2012-12-09 LAB — GLUCOSE, CAPILLARY
Glucose-Capillary: 329 mg/dL — ABNORMAL HIGH (ref 70–99)
Glucose-Capillary: 347 mg/dL — ABNORMAL HIGH (ref 70–99)
Glucose-Capillary: 383 mg/dL — ABNORMAL HIGH (ref 70–99)
Glucose-Capillary: 274 mg/dL — ABNORMAL HIGH (ref 70–99)

## 2012-12-09 LAB — COMPREHENSIVE METABOLIC PANEL
ALT: 15 U/L (ref 0–53)
AST: 13 U/L (ref 0–37)
Albumin: 3.5 g/dL (ref 3.5–5.2)
Alkaline Phosphatase: 90 U/L (ref 39–117)
Calcium: 9.5 mg/dL (ref 8.4–10.5)
GFR calc Af Amer: 84 mL/min — ABNORMAL LOW (ref >90)
GFR calc non Af Amer: 73 mL/min — ABNORMAL LOW (ref >90)
Glucose, Bld: 379 mg/dL — ABNORMAL HIGH (ref 70–99)
Potassium: 4.1 mEq/L (ref 3.5–5.1)
Sodium: 130 mEq/L — ABNORMAL LOW (ref 135–145)
Total Protein: 7.7 g/dL (ref 6.0–8.3)

## 2012-12-09 LAB — POCT I-STAT TROPONIN I: Troponin i, poc: 0 ng/mL (ref 0.00–0.08)

## 2012-12-09 LAB — CREATININE, SERUM
Creatinine, Ser: 1.1 mg/dL (ref 0.50–1.35)
GFR calc non Af Amer: 78 mL/min — ABNORMAL LOW (ref >90)

## 2012-12-09 LAB — TSH: TSH: 1.566 u[IU]/mL (ref 0.350–4.500)

## 2012-12-09 LAB — D-DIMER, QUANTITATIVE: D-Dimer, Quant: 0.62 ug/mL-FEU — ABNORMAL HIGH (ref 0.00–0.48)

## 2012-12-09 LAB — BASIC METABOLIC PANEL
BUN: 13 mg/dL (ref 6–23)
CO2: 26 mEq/L (ref 19–32)
Calcium: 9.2 mg/dL (ref 8.4–10.5)
Chloride: 94 mEq/L — ABNORMAL LOW (ref 96–112)
Creatinine, Ser: 1.08 mg/dL (ref 0.50–1.35)
GFR calc Af Amer: >90 mL/min (ref >90)
GFR calc non Af Amer: 80 mL/min — ABNORMAL LOW (ref >90)

## 2012-12-09 MED ORDER — ONDANSETRON HCL 4 MG PO TABS: 4.0000 mg | ORAL_TABLET | Freq: Four times a day (QID) | ORAL | Status: DC | PRN

## 2012-12-09 MED ORDER — HYDROCODONE-ACETAMINOPHEN 5-325 MG PO TABS: 1.0000 | ORAL_TABLET | ORAL | Status: DC | PRN

## 2012-12-09 MED ORDER — PNEUMOCOCCAL VAC POLYVALENT 25 MCG/0.5ML IJ INJ
0.5000 mL | INJECTION | INTRAMUSCULAR | Status: AC
  Administered 2012-12-10: 0.5 mL via INTRAMUSCULAR
  Filled 2012-12-09: qty 0.5

## 2012-12-09 MED ORDER — DEXTROSE 5 % IV SOLN
500.0000 mg | INTRAVENOUS | Status: DC
  Administered 2012-12-10: 500 mg via INTRAVENOUS
  Filled 2012-12-09: qty 500

## 2012-12-09 MED ORDER — ACETAMINOPHEN 650 MG RE SUPP: 650.0000 mg | Freq: Four times a day (QID) | RECTAL | Status: DC | PRN

## 2012-12-09 MED ORDER — ASPIRIN 81 MG PO CHEW
324.0000 mg | CHEWABLE_TABLET | Freq: Once | ORAL | Status: AC
  Administered 2012-12-09: 324 mg via ORAL
  Filled 2012-12-09: qty 4

## 2012-12-09 MED ORDER — SODIUM CHLORIDE 0.9 % IV SOLN
INTRAVENOUS | Status: DC
  Administered 2012-12-09 – 2012-12-10 (×3): via INTRAVENOUS

## 2012-12-09 MED ORDER — DEXTROSE 5 % IV SOLN
1.0000 g | INTRAVENOUS | Status: DC
  Administered 2012-12-10: 1 g via INTRAVENOUS
  Filled 2012-12-09: qty 10

## 2012-12-09 MED ORDER — INSULIN ASPART 100 UNIT/ML ~~LOC~~ SOLN
0.0000 [IU] | Freq: Three times a day (TID) | SUBCUTANEOUS | Status: DC
  Administered 2012-12-09: 15 [IU] via SUBCUTANEOUS
  Administered 2012-12-09: 8 [IU] via SUBCUTANEOUS
  Administered 2012-12-10: 2 [IU] via SUBCUTANEOUS
  Administered 2012-12-10: 8 [IU] via SUBCUTANEOUS
  Administered 2012-12-10: 5 [IU] via SUBCUTANEOUS
  Administered 2012-12-11: 8 [IU] via SUBCUTANEOUS
  Administered 2012-12-11: 3 [IU] via SUBCUTANEOUS

## 2012-12-09 MED ORDER — DEXTROSE 5 % IV SOLN
1.0000 g | Freq: Once | INTRAVENOUS | Status: AC
  Administered 2012-12-09: 1 g via INTRAVENOUS
  Filled 2012-12-09: qty 10

## 2012-12-09 MED ORDER — DEXTROSE 5 % IV SOLN
500.0000 mg | Freq: Once | INTRAVENOUS | Status: DC
  Administered 2012-12-09: 500 mg via INTRAVENOUS

## 2012-12-09 MED ORDER — INSULIN ASPART 100 UNIT/ML ~~LOC~~ SOLN
0.0000 [IU] | Freq: Every day | SUBCUTANEOUS | Status: DC
  Administered 2012-12-09: 4 [IU] via SUBCUTANEOUS
  Administered 2012-12-10: 5 [IU] via SUBCUTANEOUS

## 2012-12-09 MED ORDER — DEXTROSE 5 % IV SOLN
1.0000 g | INTRAVENOUS | Status: DC
  Filled 2012-12-09: qty 10

## 2012-12-09 MED ORDER — INSULIN DETEMIR 100 UNIT/ML ~~LOC~~ SOLN
5.0000 [IU] | Freq: Every day | SUBCUTANEOUS | Status: DC
  Administered 2012-12-09: 5 [IU] via SUBCUTANEOUS
  Filled 2012-12-09 (×2): qty 0.05

## 2012-12-09 MED ORDER — SODIUM CHLORIDE 0.9 % IV BOLUS (SEPSIS)
500.0000 mL | Freq: Once | INTRAVENOUS | Status: AC
  Administered 2012-12-09: 500 mL via INTRAVENOUS

## 2012-12-09 MED ORDER — POLYETHYLENE GLYCOL 3350 17 G PO PACK
17.0000 g | PACK | Freq: Every day | ORAL | Status: DC | PRN
  Filled 2012-12-09: qty 1

## 2012-12-09 MED ORDER — INSULIN ASPART 100 UNIT/ML ~~LOC~~ SOLN
4.0000 [IU] | Freq: Three times a day (TID) | SUBCUTANEOUS | Status: DC
  Administered 2012-12-09 – 2012-12-11 (×7): 4 [IU] via SUBCUTANEOUS

## 2012-12-09 MED ORDER — IOHEXOL 350 MG/ML SOLN
100.0000 mL | Freq: Once | INTRAVENOUS | Status: AC | PRN
  Administered 2012-12-09: 100 mL via INTRAVENOUS

## 2012-12-09 MED ORDER — HEPARIN SODIUM (PORCINE) 5000 UNIT/ML IJ SOLN
5000.0000 [IU] | Freq: Three times a day (TID) | INTRAMUSCULAR | Status: DC
  Administered 2012-12-09 – 2012-12-11 (×7): 5000 [IU] via SUBCUTANEOUS
  Filled 2012-12-09 (×10): qty 1

## 2012-12-09 MED ORDER — ACETAMINOPHEN 325 MG PO TABS
650.0000 mg | ORAL_TABLET | Freq: Four times a day (QID) | ORAL | Status: DC | PRN
  Administered 2012-12-09 – 2012-12-11 (×3): 650 mg via ORAL
  Filled 2012-12-09 (×3): qty 2

## 2012-12-09 MED ORDER — ONDANSETRON HCL 4 MG/2ML IJ SOLN: 4.0000 mg | Freq: Four times a day (QID) | INTRAMUSCULAR | Status: DC | PRN

## 2012-12-09 NOTE — ED Provider Notes (Signed)
CSN: [id number]     Arrival date & time 12/09/12  0131 History   First MD Initiated Contact with Patient 12/09/12 0131     Chief complaint: Chest pain  (Consider location/radiation/quality/duration/timing/severity/associated sxs/prior Treatment) Patient is a 47 y.o. male presenting with chest pain. The history is provided by the patient and the spouse.  Chest Pain He has been complaining of pain in his chest for the last 24 hours. Pain started in the right side in his mid to left side. It is sharp with some and worse with lying down. Is no associated dyspnea, nausea, diaphoresis. He has not had fever or chills. He does have a cough productive of some whitish sputum. He took aspirin at about 5 PM with no relief. Past history is significant for diabetes and sleep apnea. There is no history of hypertension or hyperlipidemia he is a nonsmoker.  Past Medical History  Diagnosis Date  . Diabetes mellitus   . Sleep apnea    Past Surgical History  Procedure Laterality Date  . Tonsillectomy     No family history on file. History  Substance Use Topics  . Smoking status: Never Smoker   . Smokeless tobacco: Not on file  . Alcohol Use: Yes    Review of Systems  Cardiovascular: Positive for chest pain.  All other systems reviewed and are negative.    Allergies  Review of patient's allergies indicates no known allergies.  Home Medications   Current Outpatient Rx  Name  Route  Sig  Dispense  Refill  . albuterol (PROVENTIL HFA;VENTOLIN HFA) 108 (90 BASE) MCG/ACT inhaler   Inhalation   Inhale 2 puffs into the lungs every 4 (four) hours as needed for wheezing.   1 Inhaler   0   . azithromycin (ZITHROMAX Z-PAK) 250 MG tablet      2 po day one, then 1 daily x 4 days   6 tablet   0   . glipiZIDE (GLUCOTROL) 10 MG tablet   Oral   Take 10 mg by mouth 2 (two) times daily before a meal.         . glipiZIDE (GLUCOTROL) 10 MG tablet   Oral   Take 1 tablet (10 mg total) by mouth 2 (two)  times daily before a meal.   60 tablet   0   . insulin aspart (NOVOLOG) 100 UNIT/ML injection   Subcutaneous   Inject 4-12 Units into the skin 2 (two) times daily before a meal. Sliding scale         . metFORMIN (GLUCOPHAGE) 500 MG tablet   Oral   Take 500 mg by mouth 2 (two) times daily with a meal.         . metFORMIN (GLUCOPHAGE) 500 MG tablet   Oral   Take 1 tablet (500 mg total) by mouth 2 (two) times daily with a meal.   60 tablet   0    BP 147/101  Pulse 116  Temp(Src) 97.8 F (36.6 C) (Oral)  Resp 21  Wt 204 lb 1.6 oz (92.579 kg)  SpO2 96% Physical Exam  Nursing note and vitals reviewed.  47 year old male, resting comfortably and in no acute distress. Vital signs are significant for hypertension with blood pressure 147/101, tachypnea with respiratory rate of 21, and tachycardia heart rate 116. Oxygen saturation is 96%, which is normal. Head is normocephalic and atraumatic. PERRLA, EOMI. Oropharynx is clear. Neck is nontender and supple without adenopathy or JVD. Back is nontender and there  is no CVA tenderness. Lungs are clear without rales, wheezes, or rhonchi. Chest is mildly tender in the left anterior chest wall. Heart has regular rate and rhythm without murmur. Abdomen is soft, flat, nontender without masses or hepatosplenomegaly and peristalsis is normoactive. Extremities have no cyanosis or edema, full range of motion is present. Skin is warm and dry without rash. Neurologic: Mental status is normal, cranial nerves are intact, there are no motor or sensory deficits.  ED Course  Procedures (including critical care time) Labs Review Results for orders placed during the hospital encounter of 12/09/12  CBC WITH DIFFERENTIAL      Result Value Range   WBC 9.7  4.0 - 10.5 K/uL   RBC 4.85  4.22 - 5.81 MIL/uL   Hemoglobin 15.1  13.0 - 17.0 g/dL   HCT 16.1  09.6 - 04.5 %   MCV 87.8  78.0 - 100.0 fL   MCH 31.1  26.0 - 34.0 pg   MCHC 35.4  30.0 - 36.0 g/dL    RDW 40.9  81.1 - 91.4 %   Platelets 337  150 - 400 K/uL   Neutrophils Relative % 71  43 - 77 %   Neutro Abs 6.8  1.7 - 7.7 K/uL   Lymphocytes Relative 16  12 - 46 %   Lymphs Abs 1.6  0.7 - 4.0 K/uL   Monocytes Relative 12  3 - 12 %   Monocytes Absolute 1.2 (*) 0.1 - 1.0 K/uL   Eosinophils Relative 1  0 - 5 %   Eosinophils Absolute 0.1  0.0 - 0.7 K/uL   Basophils Relative 0  0 - 1 %   Basophils Absolute 0.0  0.0 - 0.1 K/uL  BASIC METABOLIC PANEL      Result Value Range   Sodium 131 (*) 135 - 145 mEq/L   Potassium 4.1  3.5 - 5.1 mEq/L   Chloride 94 (*) 96 - 112 mEq/L   CO2 26  19 - 32 mEq/L   Glucose, Bld 346 (*) 70 - 99 mg/dL   BUN 13  6 - 23 mg/dL   Creatinine, Ser 7.82  0.50 - 1.35 mg/dL   Calcium 9.2  8.4 - 95.6 mg/dL   GFR calc non Af Amer 80 (*) >90 mL/min   GFR calc Af Amer >90  >90 mL/min  D-DIMER, QUANTITATIVE      Result Value Range   D-Dimer, Quant 0.62 (*) 0.00 - 0.48 ug/mL-FEU  CBC      Result Value Range   WBC 8.3  4.0 - 10.5 K/uL   RBC 5.03  4.22 - 5.81 MIL/uL   Hemoglobin 15.4  13.0 - 17.0 g/dL   HCT 21.3  08.6 - 57.8 %   MCV 88.7  78.0 - 100.0 fL   MCH 30.6  26.0 - 34.0 pg   MCHC 34.5  30.0 - 36.0 g/dL   RDW 46.9  62.9 - 52.8 %   Platelets 345  150 - 400 K/uL  COMPREHENSIVE METABOLIC PANEL      Result Value Range   Sodium 130 (*) 135 - 145 mEq/L   Potassium 4.1  3.5 - 5.1 mEq/L   Chloride 91 (*) 96 - 112 mEq/L   CO2 30  19 - 32 mEq/L   Glucose, Bld 379 (*) 70 - 99 mg/dL   BUN 15  6 - 23 mg/dL   Creatinine, Ser 4.13  0.50 - 1.35 mg/dL   Calcium 9.5  8.4 - 24.4 mg/dL  Total Protein 7.7  6.0 - 8.3 g/dL   Albumin 3.5  3.5 - 5.2 g/dL   AST 13  0 - 37 U/L   ALT 15  0 - 53 U/L   Alkaline Phosphatase 90  39 - 117 U/L   Total Bilirubin 0.4  0.3 - 1.2 mg/dL   GFR calc non Af Amer 73 (*) >90 mL/min   GFR calc Af Amer 84 (*) >90 mL/min   Imaging Review Dg Chest 2 View  12/09/2012   CLINICAL DATA:  Chest pain and cough.  EXAM: CHEST  2 VIEW   COMPARISON:  08/02/2012  FINDINGS: The left apex is relatively lucent, but no pleural line is seen to suggest pneumothorax. [place]ark sign would be unusual in the apical lung. No effusion or pneumothorax. Normal heart size.  IMPRESSION: No edema or consolidation.   Electronically Signed   By: Tiburcio Pea M.D.   On: 12/09/2012 02:24   Ct Angio Chest Pe W/cm &/or Wo Cm  12/09/2012   CLINICAL DATA:  Shortness of breath.  EXAM: CT ANGIOGRAPHY CHEST WITH CONTRAST  TECHNIQUE: Multidetector CT imaging of the chest was performed using the standard protocol during bolus administration of intravenous contrast. Multiplanar CT image reconstructions including MIPs were obtained to evaluate the vascular anatomy.  CONTRAST:  OMNIPAQUE IOHEXOL 350 MG/ML SOLN  COMPARISON:  None.  FINDINGS: THORACIC INLET/BODY WALL:  No acute abnormality.  MEDIASTINUM:  Normal heart size. No pericardial effusion. Coronary artery atherosclerosis. No acute vascular abnormality. No adenopathy.  LUNG WINDOWS:  There is a relatively small (5 cm diameter) area of subpleural opacification/consolidation in the superior segment right lower lobe. The portions of the nearest pulmonary artery that are large enough to evaluate are patent. Calcified pulmonary nodule in the right lower lobe, compatible with previous granulomatous infection. No pleural effusion or cavitation.  UPPER ABDOMEN:  2 cm low dense lesion that appears to arise from the adrenal gland, density measurements compatible with adenoma. A hepatic cyst, contacting the adrenal gland, could also have this appearance, but is not favored due to the epicenter of the mass.  OSSEOUS:  No acute fracture.  No suspicious lytic or blastic lesions.  Review of the MIP images confirms the above findings.  IMPRESSION: 1. Consolidation/pneumonia in the right lower lobe, pleural based. 2. No evidence of pulmonary embolism. 3. 2 cm right adrenal adenoma.   Electronically Signed   By: Tiburcio Pea  M.D.   On: 12/09/2012 06:08    Date: 12/09/2012  Rate: 120  Rhythm: sinus tachycardia  QRS Axis: normal  Intervals: normal  ST/T Wave abnormalities: normal  Conduction Disutrbances:none  Narrative Interpretation: Atrial hypertrophy, sinus tachycardia. When compared with ECG of 08/02/2012, no significant changes are seen.  Old EKG Reviewed: none available   MDM   1. Community acquired pneumonia   2. Observed sleep apnea    Chest pain which seems to be related to his cough. Chest x-ray or be obtained to rule out pneumonia. Because of pleuritic nature pain, he will be screened with d-dimer for possible pulmonary embolism. During exam, he is noted to be falling asleep and has periods of apnea in a pattern that seems fairly typical for obstructive sleep apnea. Old records are reviewed and he actually had a sleep study in August showing severe sleep apnea. He is supposed to followup with [facility] to get a machine to help him with his sleep apnea.  Chest x-ray did not show evidence of pneumonia but d-dimer  is elevated. He is sent for CT angiogram which did show evidence of pneumonia and he is started on antibiotics. He is given ceftriaxone and azithromycin in the ED. he has persistent tachycardia and episodes of oxygen desaturation so plans will be made to admit him. Case is discussed with Dr. Lovell Sheehan of [facility] who agrees to admit the patient.  Dione Booze, MD 12/09/12 [phone]

## 2012-12-09 NOTE — Progress Notes (Addendum)
Pt admitted to the unit at 0945. Pt mental status is alert and oriented x 4. Pt oriented to room, staff, and call bell. Skin is intact. Full assessment charted in [facility]. Call bell within reach. Visitor guidelines reviewed w/ pt and/or family.  Peri Maris, MBA, BS, RN

## 2012-12-09 NOTE — ED Notes (Signed)
Pt complaining of right sided chest pain that radiates down the right ribcage, and across to the left chest. Pt reports SOB, nausea and vomiting. Pt reports taking Tylenol yesterday evening, but has not taken anything within the last 4 hours for pain.

## 2012-12-09 NOTE — H&P (Signed)
[facility] History and Physical  Jonathan Austin GNF:[id number] DOB: April 12, 1965 DOA: 12/09/2012  Referring physician: Dr. Preston Fleeting PCP: Pcp Not In System  Specialists: none  Chief Complaint: cough and SOB  HPI: Jonathan Austin is a 47 y.o. male  Past medical history of diabetes mellitus on metformin and sleep apnea that comes in for cough, shortness of breath and fever that started one day prior to admission. As per patient yesterday started feeling weak and he couldn't get warm, his cough has gotten progressively worse to the point where it bothers him to cough.    In the ED: CT angiography chest to rule out PE it was negative but showed a right sided posterior pneumonia A CBC was done that showed no increase in white count, a basic metabolic panel was done that was unremarkable. Review of Systems: The patient denies anorexia, weight loss,, vision loss, decreased hearing, hoarseness,  syncope,  peripheral edema, balance deficits, hemoptysis, abdominal pain, melena, hematochezia, severe indigestion/heartburn, hematuria, incontinence, genital sores, muscle weakness, suspicious skin lesions, transient blindness, difficulty walking, depression, unusual weight change, abnormal bleeding, enlarged lymph nodes, angioedema, and breast masses.    Past Medical History  Diagnosis Date  . Diabetes mellitus   . Sleep apnea    Past Surgical History  Procedure Laterality Date  . Tonsillectomy     Social History:  reports that he has been smoking Cigars.  He does not have any smokeless tobacco history on file. He reports that he drinks about 1.8 ounces of alcohol per week. He reports that he uses illicit drugs (Marijuana and Cocaine).  lives at home with wife   No Known Allergies  History reviewed. No pertinent family history.  mother and father are alive they have any pertinent past medical history   Prior to Admission medications   Medication Sig Start Date End Date Taking? Authorizing  Provider  glipiZIDE (GLUCOTROL) 10 MG tablet Take 10 mg by mouth daily.    Yes Historical Provider, MD  insulin aspart (NOVOLOG) 100 UNIT/ML injection Inject 4-12 Units into the skin 2 (two) times daily before a meal. Sliding scale   Yes Historical Provider, MD  lisinopril (PRINIVIL,ZESTRIL) 10 MG tablet Take 10 mg by mouth daily.   Yes Historical Provider, MD  metFORMIN (GLUCOPHAGE) 500 MG tablet Take 1 tablet (500 mg total) by mouth 2 (two) times daily with a meal. 08/02/12  Yes Gilda Crease, MD  terbinafine (LAMISIL) 250 MG tablet  11/22/12  Yes Historical Provider, MD   Physical Exam: Filed Vitals:   12/09/12 0700  BP: 140/94  Pulse: 91  Temp:   Resp: 16    BP 140/94  Pulse 91  Temp(Src) 97.8 F (36.6 C) (Oral)  Resp 16  Wt 92.579 kg (204 lb 1.6 oz)  SpO2 89%  General Appearance:    Alert, cooperative, no distress, appears stated age, sleeping   Head:    Normocephalic, without obvious abnormality, atraumatic           Throat:   Lips, mucosa, and tongue dry  Neck:   Supple, symmetrical, trachea midline, no adenopathy;       thyroid:  No JVD  Back:     Symmetric, no curvature, ROM normal, no CVA tenderness  Lungs:     Clear to auscultation bilaterally, respirations unlabored     Heart:    Regular rate and rhythm, S1 and S2 normal, no murmur, rub   or gallop  Abdomen:     Soft, non-tender, bowel sounds  active all four quadrants,    no masses, no organomegaly           Pulses:   2+ and symmetric all extremities  Skin:   Skin color, texture, turgor normal, no rashes or lesions  Lymph nodes:   Cervical, supraclavicular, and axillary nodes normal  Neurologic:   CNII-XII intact. Normal strength, sensation and reflexes      throughout    Labs on Admission:  Basic Metabolic Panel:  Recent Labs Lab 12/09/12 0145 12/09/12 0203  NA 130* 131*  K 4.1 4.1  CL 91* 94*  CO2 30 26  GLUCOSE 379* 346*  BUN 15 13  CREATININE 1.17 1.08  CALCIUM 9.5 9.2   Liver  Function Tests:  Recent Labs Lab 12/09/12 0145  AST 13  ALT 15  ALKPHOS 90  BILITOT 0.4  PROT 7.7  ALBUMIN 3.5   No results found for this basename: LIPASE, AMYLASE,  in the last 168 hours No results found for this basename: AMMONIA,  in the last 168 hours CBC:  Recent Labs Lab 12/09/12 0145 12/09/12 0203  WBC 8.3 9.7  NEUTROABS  --  6.8  HGB 15.4 15.1  HCT 44.6 42.6  MCV 88.7 87.8  PLT 345 337   Cardiac Enzymes: No results found for this basename: CKTOTAL, CKMB, CKMBINDEX, TROPONINI,  in the last 168 hours  BNP (last 3 results) No results found for this basename: PROBNP,  in the last 8760 hours CBG: No results found for this basename: GLUCAP,  in the last 168 hours  Radiological Exams on Admission: Dg Chest 2 View  12/09/2012   CLINICAL DATA:  Chest pain and cough.  EXAM: CHEST  2 VIEW  COMPARISON:  08/02/2012  FINDINGS: The left apex is relatively lucent, but no pleural line is seen to suggest pneumothorax. Westermark sign would be unusual in the apical lung. No effusion or pneumothorax. Normal heart size.  IMPRESSION: No edema or consolidation.   Electronically Signed   By: Tiburcio Pea M.D.   On: 12/09/2012 02:24   Ct Angio Chest Pe W/cm &/or Wo Cm  12/09/2012   ADDENDUM REPORT: 12/09/2012 06:24  ADDENDUM: Impression #4.  Coronary artery atherosclerosis.   Electronically Signed   By: Tiburcio Pea M.D.   On: 12/09/2012 06:24   12/09/2012   CLINICAL DATA:  Shortness of breath.  EXAM: CT ANGIOGRAPHY CHEST WITH CONTRAST  TECHNIQUE: Multidetector CT imaging of the chest was performed using the standard protocol during bolus administration of intravenous contrast. Multiplanar CT image reconstructions including MIPs were obtained to evaluate the vascular anatomy.  CONTRAST:  OMNIPAQUE IOHEXOL 350 MG/ML SOLN  COMPARISON:  None.  FINDINGS: THORACIC INLET/BODY WALL:  No acute abnormality.  MEDIASTINUM:  Normal heart size. No pericardial effusion. Coronary artery  atherosclerosis. No acute vascular abnormality. No adenopathy.  LUNG WINDOWS:  There is a relatively small (5 cm diameter) area of subpleural opacification/consolidation in the superior segment right lower lobe. The portions of the nearest pulmonary artery that are large enough to evaluate are patent. Calcified pulmonary nodule in the right lower lobe, compatible with previous granulomatous infection. No pleural effusion or cavitation.  UPPER ABDOMEN:  2 cm low dense lesion that appears to arise from the adrenal gland, density measurements compatible with adenoma. A hepatic cyst, contacting the adrenal gland, could also have this appearance, but is not favored due to the epicenter of the mass.  OSSEOUS:  No acute fracture.  No suspicious lytic or blastic lesions.  Review of the MIP images confirms the above findings.  IMPRESSION: 1. Consolidation/pneumonia in the right lower lobe, pleural based. 2. No evidence of pulmonary embolism. 3. 2 cm right adrenal adenoma.  Electronically Signed: By: Tiburcio Pea M.D. On: 12/09/2012 06:08    EKG: Independently reviewed. Sinus tachycardia possible right atrial enlargement  Assessment/Plan CAP (community acquired pneumonia): - I agree with starting Rocephin and azithromycin, get sputum cultures, use Tylenol for fever mild narcotics for when necessary in case Tylenol is not working for pain. - He seems to have severe sleep apnea question of this right lower posterior lobe pneumonia as a consequence of aspiration. During our interview he fell asleep multiple times.  Controlled diabetes mellitus type II without complication - DC metformin and glipizide start him on low dose Levemir and sliding scale insulin. Started on IV fluids as he seems to be dehydrated by physical exam. He just had a CT and she'll followup metformin continue to monitor creatinine.    Code Status: full Family Communication: wife Disposition Plan: inpatient  Time spent: 30  Marinda Elk [facility] Pager [phone]  If 7PM-7AM, please contact night-coverage [email or web address] Password [facility] 12/09/2012, 7:49 AM

## 2012-12-09 NOTE — Progress Notes (Signed)
RT set patient up using an Auto CPAP and nasal mask.  Patient was unsure of settings but stated that he used nasal pillows outside of hospital.  Patient tolerating well at this time.  RT will continue to monitor.

## 2012-12-09 NOTE — ED Notes (Signed)
Pt transported to radiology.

## 2012-12-09 NOTE — Progress Notes (Signed)
Discussed patient's substance abuse history.  Patient stated that his last drug use was on 12-07-2012, which he smoked marijuana laced with crack cocaine.  Provided emotional support to patient.  Provided list of Narcotics Anonymous meeting that are at most 10 miles from patient's home.  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  Peri Maris, MBA, BS, RN

## 2012-12-10 LAB — GLUCOSE, CAPILLARY
Glucose-Capillary: 246 mg/dL — ABNORMAL HIGH (ref 70–99)
Glucose-Capillary: 264 mg/dL — ABNORMAL HIGH (ref 70–99)
Glucose-Capillary: 132 mg/dL — ABNORMAL HIGH (ref 70–99)
Glucose-Capillary: 313 mg/dL — ABNORMAL HIGH (ref 70–99)

## 2012-12-10 LAB — COMPREHENSIVE METABOLIC PANEL
ALT: 13 U/L (ref 0–53)
AST: 13 U/L (ref 0–37)
Albumin: 2.9 g/dL — ABNORMAL LOW (ref 3.5–5.2)
CO2: 28 mEq/L (ref 19–32)
Calcium: 8.9 mg/dL (ref 8.4–10.5)
Sodium: 135 mEq/L (ref 135–145)
Total Protein: 6.8 g/dL (ref 6.0–8.3)

## 2012-12-10 MED ORDER — SODIUM CHLORIDE 0.9 % IV SOLN
INTRAVENOUS | Status: AC
  Administered 2012-12-10 – 2012-12-11 (×2): via INTRAVENOUS

## 2012-12-10 MED ORDER — LEVOFLOXACIN 750 MG PO TABS
750.0000 mg | ORAL_TABLET | Freq: Every day | ORAL | Status: DC
  Administered 2012-12-10 – 2012-12-11 (×2): 750 mg via ORAL
  Filled 2012-12-10 (×2): qty 1

## 2012-12-10 MED ORDER — INSULIN DETEMIR 100 UNIT/ML ~~LOC~~ SOLN
20.0000 [IU] | Freq: Two times a day (BID) | SUBCUTANEOUS | Status: DC
  Administered 2012-12-10 – 2012-12-11 (×3): 20 [IU] via SUBCUTANEOUS
  Filled 2012-12-10 (×4): qty 0.2

## 2012-12-10 MED ORDER — INSULIN DETEMIR 100 UNIT/ML ~~LOC~~ SOLN
10.0000 [IU] | Freq: Two times a day (BID) | SUBCUTANEOUS | Status: DC
  Administered 2012-12-10: 10 [IU] via SUBCUTANEOUS
  Filled 2012-12-10 (×2): qty 0.1

## 2012-12-10 NOTE — Progress Notes (Signed)
Pt. Called [facility], to get appointment setup for 2nd sleep study.  Appointment was set for 12-14-2012.  Patient eager to get CPAP.  Peri Maris, MBA, BS, RN

## 2012-12-10 NOTE — Progress Notes (Signed)
[facility] PROGRESS NOTE Assessment/Plan: CAP (community acquired pneumonia) - change antibiotics to levaquin for 5 days.  Controlled diabetes mellitus type II without complication - cont to hold metformin, cont IV fluids, check a b-met in am. - cr stable.  - increase lantus.    Code Status: full Family Communication: none  Disposition Plan: inpatient   Consultants:  none  Procedures:  CT chest  Antibiotics:  levaquin  HPI/Subjective: Feels better no complains  Objective: Filed Vitals:   12/09/12 1814 12/09/12 2128 12/10/12 0529 12/10/12 1103  BP: 126/93 121/77 118/72 133/84  Pulse: 116 108 107 103  Temp: 100.1 F (37.8 C) 98.3 F (36.8 C) 98.5 F (36.9 C) 98.2 F (36.8 C)  TempSrc:  Oral Oral Oral  Resp: 20 20 20 18   Height:  5\' 7"  (1.702 m)    Weight:  94.711 kg (208 lb 12.8 oz)    SpO2: 98% 99% 94% 96%    Intake/Output Summary (Last 24 hours) at 12/10/12 1121 Last data filed at 12/10/12 1100  Gross per 24 hour  Intake 2731.67 ml  Output   1750 ml  Net 981.67 ml   Filed Weights   12/09/12 0131 12/09/12 0943 12/09/12 2128  Weight: 92.579 kg (204 lb 1.6 oz) 92.4 kg (203 lb 11.3 oz) 94.711 kg (208 lb 12.8 oz)    Exam:  General: Alert, awake, oriented x3, in no acute distress.  HEENT: No bruits, no goiter.  Heart: Regular rate and rhythm, without murmurs, rubs, gallops.  Lungs: Good air movement, clear to auscultation Abdomen: Soft, nontender, nondistended, positive bowel sounds.  Neuro: Grossly intact, nonfocal.   Data Reviewed: Basic Metabolic Panel:  Recent Labs Lab 12/09/12 0145 12/09/12 0203 12/09/12 1000 12/10/12 0619  NA 130* 131*  --  135  K 4.1 4.1  --  4.1  CL 91* 94*  --  98  CO2 30 26  --  28  GLUCOSE 379* 346*  --  255*  BUN 15 13  --  9  CREATININE 1.17 1.08 1.10 1.04  CALCIUM 9.5 9.2  --  8.9   Liver Function Tests:  Recent Labs Lab 12/09/12 0145 12/10/12 0619  AST 13 13  ALT 15 13  ALKPHOS 90 63   BILITOT 0.4 0.3  PROT 7.7 6.8  ALBUMIN 3.5 2.9*   No results found for this basename: LIPASE, AMYLASE,  in the last 168 hours No results found for this basename: AMMONIA,  in the last 168 hours CBC:  Recent Labs Lab 12/09/12 0145 12/09/12 0203 12/09/12 1000  WBC 8.3 9.7 8.0  NEUTROABS  --  6.8  --   HGB 15.4 15.1 15.0  HCT 44.6 42.6 42.9  MCV 88.7 87.8 87.7  PLT 345 337 352   Cardiac Enzymes: No results found for this basename: CKTOTAL, CKMB, CKMBINDEX, TROPONINI,  in the last 168 hours BNP (last 3 results) No results found for this basename: PROBNP,  in the last 8760 hours CBG:  Recent Labs Lab 12/09/12 0854 12/09/12 1146 12/09/12 1632 12/09/12 2124 12/10/12 0740  GLUCAP 347* 383* 274* 310* 246*    No results found for this or any previous visit (from the past 240 hour(s)).   Studies: Dg Chest 2 View  12/09/2012   CLINICAL DATA:  Chest pain and cough.  EXAM: CHEST  2 VIEW  COMPARISON:  08/02/2012  FINDINGS: The left apex is relatively lucent, but no pleural line is seen to suggest pneumothorax. Westermark sign would be unusual in the apical  lung. No effusion or pneumothorax. Normal heart size.  IMPRESSION: No edema or consolidation.   Electronically Signed   By: Tiburcio Pea M.D.   On: 12/09/2012 02:24   Ct Angio Chest Pe W/cm &/or Wo Cm  12/09/2012   ADDENDUM REPORT: 12/09/2012 06:24  ADDENDUM: Impression #4.  Coronary artery atherosclerosis.   Electronically Signed   By: Tiburcio Pea M.D.   On: 12/09/2012 06:24   12/09/2012   CLINICAL DATA:  Shortness of breath.  EXAM: CT ANGIOGRAPHY CHEST WITH CONTRAST  TECHNIQUE: Multidetector CT imaging of the chest was performed using the standard protocol during bolus administration of intravenous contrast. Multiplanar CT image reconstructions including MIPs were obtained to evaluate the vascular anatomy.  CONTRAST:  OMNIPAQUE IOHEXOL 350 MG/ML SOLN  COMPARISON:  None.  FINDINGS: THORACIC INLET/BODY WALL:  No acute  abnormality.  MEDIASTINUM:  Normal heart size. No pericardial effusion. Coronary artery atherosclerosis. No acute vascular abnormality. No adenopathy.  LUNG WINDOWS:  There is a relatively small (5 cm diameter) area of subpleural opacification/consolidation in the superior segment right lower lobe. The portions of the nearest pulmonary artery that are large enough to evaluate are patent. Calcified pulmonary nodule in the right lower lobe, compatible with previous granulomatous infection. No pleural effusion or cavitation.  UPPER ABDOMEN:  2 cm low dense lesion that appears to arise from the adrenal gland, density measurements compatible with adenoma. A hepatic cyst, contacting the adrenal gland, could also have this appearance, but is not favored due to the epicenter of the mass.  OSSEOUS:  No acute fracture.  No suspicious lytic or blastic lesions.  Review of the MIP images confirms the above findings.  IMPRESSION: 1. Consolidation/pneumonia in the right lower lobe, pleural based. 2. No evidence of pulmonary embolism. 3. 2 cm right adrenal adenoma.  Electronically Signed: By: Tiburcio Pea M.D. On: 12/09/2012 06:08    Scheduled Meds: . heparin  5,000 Units Subcutaneous Q8H  . insulin aspart  0-15 Units Subcutaneous TID WC  . insulin aspart  0-5 Units Subcutaneous QHS  . insulin aspart  4 Units Subcutaneous TID WC  . insulin detemir  20 Units Subcutaneous BID  . levofloxacin  750 mg Oral Daily   Continuous Infusions: . sodium chloride 100 mL/hr at 12/10/12 0704     Marinda Elk  [facility] Pager [phone]. If 8PM-8AM, please contact night-coverage at [email or web address], password [facility] 12/10/2012, 11:21 AM  LOS: 1 day

## 2012-12-10 NOTE — Progress Notes (Signed)
Inpatient Diabetes Program Recommendations  AACE/ADA: New Consensus Statement on Inpatient Glycemic Control (2013)  Target Ranges:  Prepandial:   less than 140 mg/dL      Peak postprandial:   less than 180 mg/dL (1-2 hours)      Critically ill patients:  140 - 180 mg/dL   Reason for Visit: Results for KAINE, MCQUILLEN (MRN [id number]) as of 12/10/2012 15:24  Ref. Range 12/09/2012 01:45 12/09/2012 02:03 12/10/2012 06:19  Glucose Latest Range: 70-99 mg/dL 865 (H) 784 (H) 696 (H)  Results for JAELIN, FACKLER (MRN [id number]) as of 12/10/2012 15:24  Ref. Range 12/09/2012 10:00  Hemoglobin A1C Latest Range: <5.7 % 11.2 (H)   Agree with the addition of basal insulin.  Spoke to patient regarding his home diabetes regimen.  He currently only takes Novolog sliding scale plus Metformin/Glipizide at home. He does have a glucose meter and states that CBG's run 140-240's at home.  States he see's MD at the [facility]. Discussed goal A1C and the importance of glycemic control.   He is agreeable to the addition of basal insulin after discharge.  Also would likely benefit from follow-up with CDE for diabetes education as an outpatient.  Will order per protocol.  Beryl Meager, RN, BC-ADM Inpatient Diabetes Coordinator Pager [phone]

## 2012-12-10 NOTE — Progress Notes (Signed)
Patient on CPAP at this time and tolerating well. RT will monitor.

## 2012-12-11 LAB — BASIC METABOLIC PANEL
Calcium: 8.7 mg/dL (ref 8.4–10.5)
Chloride: 99 mEq/L (ref 96–112)
GFR calc non Af Amer: >90 mL/min (ref >90)
Glucose, Bld: 239 mg/dL — ABNORMAL HIGH (ref 70–99)
Potassium: 4 mEq/L (ref 3.5–5.1)
Sodium: 134 mEq/L — ABNORMAL LOW (ref 135–145)

## 2012-12-11 LAB — GLUCOSE, CAPILLARY
Glucose-Capillary: 195 mg/dL — ABNORMAL HIGH (ref 70–99)
Glucose-Capillary: 253 mg/dL — ABNORMAL HIGH (ref 70–99)

## 2012-12-11 MED ORDER — LEVOFLOXACIN 750 MG PO TABS: 750.0000 mg | ORAL_TABLET | Freq: Every day | ORAL | Status: DC

## 2012-12-11 MED ORDER — INSULIN DETEMIR 100 UNIT/ML FLEXPEN: 20.0000 [IU] | PEN_INJECTOR | Freq: Every day | SUBCUTANEOUS | Status: DC

## 2012-12-11 MED ORDER — INSULIN DETEMIR 100 UNIT/ML ~~LOC~~ SOLN: 20.0000 [IU] | Freq: Two times a day (BID) | SUBCUTANEOUS | Status: DC

## 2012-12-11 NOTE — Discharge Summary (Signed)
Physician Discharge Summary  Jonathan Austin ZOX:[id number] DOB: May 16, 1965 DOA: 12/09/2012  PCP: Pcp Not In System  Admit date: 12/09/2012 Discharge date: 12/11/2012  Time spent: 40 minutes  Recommendations for Outpatient Follow-up:  1. Follow up with PCP  Discharge Diagnoses:  Active Problems:   CAP (community acquired pneumonia)   Controlled diabetes mellitus type II without complication   Discharge Condition: stable  Diet recommendation: heart healthy diet  Filed Weights   12/09/12 0131 12/09/12 0943 12/09/12 2128  Weight: 92.579 kg (204 lb 1.6 oz) 92.4 kg (203 lb 11.3 oz) 94.711 kg (208 lb 12.8 oz)    History of present illness:  47 y.o. male  Past medical history of diabetes mellitus on metformin and sleep apnea that comes in for cough, shortness of breath and fever that started one day prior to admission. As per patient yesterday started feeling weak and he couldn't get warm, his cough has gotten progressively worse to the point where it bothers him to cough.    Hospital Course:  CAP (community acquired pneumonia) : - started empirically on azithro and rocephin. - change antibiotics to levaquin for 5 days.   Controlled diabetes mellitus type II without complication  - Held metformin on admission. - cr stable. Resume metformin - Added levemir will cont glipizide and metformin.   Procedures:  CT chest  Consultations:  none  Discharge Exam: Filed Vitals:   12/11/12 0512  BP: 130/89  Pulse: 99  Temp: 98.1 F (36.7 C)  Resp: 18    General: A&o x3 Cardiovascular: RRR Respiratory: good air movement CTA B/L  Discharge Instructions      Discharge Orders   Future Appointments Provider Department Dept Phone   01/08/2013 11:30 AM Oretha Milch, MD [facility] [phone]   Future Orders Complete By Expires   Ambulatory referral to Nutrition and Diabetic Education  As directed    Scheduling Instructions:     A1C=11.2%.  New to basal insulin.    Diet - low sodium heart healthy  As directed    Increase activity slowly  As directed        Medication List         glipiZIDE 10 MG tablet  Commonly known as:  GLUCOTROL  Take 10 mg by mouth daily.     insulin aspart 100 UNIT/ML injection  Commonly known as:  novoLOG  Inject 4-12 Units into the skin 2 (two) times daily before a meal. Sliding scale     insulin detemir 100 UNIT/ML injection  Commonly known as:  LEVEMIR  Inject 0.2 mLs (20 Units total) into the skin 2 (two) times daily.     Insulin Detemir 100 UNIT/ML Sopn  Commonly known as:  LEVEMIR FLEXPEN  Inject 20 Units into the skin daily.     levofloxacin 750 MG tablet  Commonly known as:  LEVAQUIN  Take 1 tablet (750 mg total) by mouth daily.     lisinopril 10 MG tablet  Commonly known as:  PRINIVIL,ZESTRIL  Take 10 mg by mouth daily.     metFORMIN 500 MG tablet  Commonly known as:  GLUCOPHAGE  Take 1 tablet (500 mg total) by mouth 2 (two) times daily with a meal.     terbinafine 250 MG tablet  Commonly known as:  LAMISIL       No Known Allergies Follow-up Information   Follow up with Pcp Not In System.       The results of significant diagnostics from this hospitalization (including  imaging, microbiology, ancillary and laboratory) are listed below for reference.    Significant Diagnostic Studies: Dg Chest 2 View  12/09/2012   CLINICAL DATA:  Chest pain and cough.  EXAM: CHEST  2 VIEW  COMPARISON:  08/02/2012  FINDINGS: The left apex is relatively lucent, but no pleural line is seen to suggest pneumothorax. Westermark sign would be unusual in the apical lung. No effusion or pneumothorax. Normal heart size.  IMPRESSION: No edema or consolidation.   Electronically Signed   By: Tiburcio Pea M.D.   On: 12/09/2012 02:24   Ct Angio Chest Pe W/cm &/or Wo Cm  12/09/2012   ADDENDUM REPORT: 12/09/2012 06:24  ADDENDUM: Impression #4.  Coronary artery atherosclerosis.   Electronically Signed   By: Tiburcio Pea M.D.   On: 12/09/2012 06:24   12/09/2012   CLINICAL DATA:  Shortness of breath.  EXAM: CT ANGIOGRAPHY CHEST WITH CONTRAST  TECHNIQUE: Multidetector CT imaging of the chest was performed using the standard protocol during bolus administration of intravenous contrast. Multiplanar CT image reconstructions including MIPs were obtained to evaluate the vascular anatomy.  CONTRAST:  OMNIPAQUE IOHEXOL 350 MG/ML SOLN  COMPARISON:  None.  FINDINGS: THORACIC INLET/BODY WALL:  No acute abnormality.  MEDIASTINUM:  Normal heart size. No pericardial effusion. Coronary artery atherosclerosis. No acute vascular abnormality. No adenopathy.  LUNG WINDOWS:  There is a relatively small (5 cm diameter) area of subpleural opacification/consolidation in the superior segment right lower lobe. The portions of the nearest pulmonary artery that are large enough to evaluate are patent. Calcified pulmonary nodule in the right lower lobe, compatible with previous granulomatous infection. No pleural effusion or cavitation.  UPPER ABDOMEN:  2 cm low dense lesion that appears to arise from the adrenal gland, density measurements compatible with adenoma. A hepatic cyst, contacting the adrenal gland, could also have this appearance, but is not favored due to the epicenter of the mass.  OSSEOUS:  No acute fracture.  No suspicious lytic or blastic lesions.  Review of the MIP images confirms the above findings.  IMPRESSION: 1. Consolidation/pneumonia in the right lower lobe, pleural based. 2. No evidence of pulmonary embolism. 3. 2 cm right adrenal adenoma.  Electronically Signed: By: Tiburcio Pea M.D. On: 12/09/2012 06:08    Microbiology: No results found for this or any previous visit (from the past 240 hour(s)).   Labs: Basic Metabolic Panel:  Recent Labs Lab 12/09/12 0145 12/09/12 0203 12/09/12 1000 12/10/12 0619 12/11/12 0450  NA 130* 131*  --  135 134*  K 4.1 4.1  --  4.1 4.0  CL 91* 94*  --  98 99  CO2 30 26  --   28 26  GLUCOSE 379* 346*  --  255* 239*  BUN 15 13  --  9 14  CREATININE 1.17 1.08 1.10 1.04 0.96  CALCIUM 9.5 9.2  --  8.9 8.7   Liver Function Tests:  Recent Labs Lab 12/09/12 0145 12/10/12 0619  AST 13 13  ALT 15 13  ALKPHOS 90 63  BILITOT 0.4 0.3  PROT 7.7 6.8  ALBUMIN 3.5 2.9*   No results found for this basename: LIPASE, AMYLASE,  in the last 168 hours No results found for this basename: AMMONIA,  in the last 168 hours CBC:  Recent Labs Lab 12/09/12 0145 12/09/12 0203 12/09/12 1000  WBC 8.3 9.7 8.0  NEUTROABS  --  6.8  --   HGB 15.4 15.1 15.0  HCT 44.6 42.6 42.9  MCV  88.7 87.8 87.7  PLT 345 337 352   Cardiac Enzymes: No results found for this basename: CKTOTAL, CKMB, CKMBINDEX, TROPONINI,  in the last 168 hours BNP: BNP (last 3 results) No results found for this basename: PROBNP,  in the last 8760 hours CBG:  Recent Labs Lab 12/10/12 0740 12/10/12 1155 12/10/12 1628 12/10/12 2108 12/11/12 0807  GLUCAP 246* 264* 132* 313* 195*       Signed:  FELIZ ORTIZ, ABRAHAM  [facility] 12/11/2012, 10:03 AM

## 2013-01-08 ENCOUNTER — Institutional Professional Consult (permissible substitution): Payer: 59 | Admitting: Pulmonary Disease

## 2013-02-05 ENCOUNTER — Encounter: Payer: 59 | Attending: Internal Medicine

## 2013-02-08 ENCOUNTER — Ambulatory Visit: Payer: 59 | Admitting: *Deleted

## 2013-02-12 ENCOUNTER — Ambulatory Visit: Payer: 59

## 2013-02-19 ENCOUNTER — Ambulatory Visit: Payer: 59

## 2013-06-28 ENCOUNTER — Emergency Department (HOSPITAL_COMMUNITY): Payer: 59

## 2013-06-28 ENCOUNTER — Encounter (HOSPITAL_COMMUNITY): Payer: Self-pay | Admitting: Emergency Medicine

## 2013-06-28 ENCOUNTER — Inpatient Hospital Stay (HOSPITAL_COMMUNITY)
Admission: EM | Admit: 2013-06-28 | Discharge: 2013-07-01 | DRG: 728 | Disposition: A | Discharge status: Home / Self Care | Payer: 59 | Attending: Internal Medicine | Admitting: Internal Medicine

## 2013-06-28 ENCOUNTER — Emergency Department (HOSPITAL_COMMUNITY)
Admission: EM | Admit: 2013-06-28 | Discharge: 2013-06-28 | Disposition: A | Discharge status: Home / Self Care | Payer: 59 | Source: Home / Self Care | Attending: Family Medicine | Admitting: Family Medicine

## 2013-06-28 DIAGNOSIS — N509 Disorder of male genital organs, unspecified: Secondary | ICD-10-CM

## 2013-06-28 DIAGNOSIS — Z833 Family history of diabetes mellitus: Secondary | ICD-10-CM

## 2013-06-28 DIAGNOSIS — R6510 Systemic inflammatory response syndrome (SIRS) of non-infectious origin without acute organ dysfunction: Secondary | ICD-10-CM

## 2013-06-28 DIAGNOSIS — N433 Hydrocele, unspecified: Secondary | ICD-10-CM | POA: Diagnosis present

## 2013-06-28 DIAGNOSIS — J189 Pneumonia, unspecified organism: Secondary | ICD-10-CM

## 2013-06-28 DIAGNOSIS — F17200 Nicotine dependence, unspecified, uncomplicated: Secondary | ICD-10-CM | POA: Diagnosis present

## 2013-06-28 DIAGNOSIS — E871 Hypo-osmolality and hyponatremia: Secondary | ICD-10-CM | POA: Diagnosis present

## 2013-06-28 DIAGNOSIS — L0390 Cellulitis, unspecified: Secondary | ICD-10-CM

## 2013-06-28 DIAGNOSIS — N492 Inflammatory disorders of scrotum: Secondary | ICD-10-CM | POA: Diagnosis present

## 2013-06-28 DIAGNOSIS — N5089 Other specified disorders of the male genital organs: Secondary | ICD-10-CM

## 2013-06-28 DIAGNOSIS — N498 Inflammatory disorders of other specified male genital organs: Principal | ICD-10-CM | POA: Diagnosis present

## 2013-06-28 DIAGNOSIS — F1410 Cocaine abuse, uncomplicated: Secondary | ICD-10-CM

## 2013-06-28 DIAGNOSIS — E1165 Type 2 diabetes mellitus with hyperglycemia: Secondary | ICD-10-CM | POA: Diagnosis present

## 2013-06-28 DIAGNOSIS — R599 Enlarged lymph nodes, unspecified: Secondary | ICD-10-CM | POA: Diagnosis present

## 2013-06-28 DIAGNOSIS — I10 Essential (primary) hypertension: Secondary | ICD-10-CM | POA: Diagnosis present

## 2013-06-28 DIAGNOSIS — N5082 Scrotal pain: Secondary | ICD-10-CM

## 2013-06-28 DIAGNOSIS — F1210 Cannabis abuse, uncomplicated: Secondary | ICD-10-CM | POA: Diagnosis present

## 2013-06-28 DIAGNOSIS — Z8249 Family history of ischemic heart disease and other diseases of the circulatory system: Secondary | ICD-10-CM

## 2013-06-28 DIAGNOSIS — D72829 Elevated white blood cell count, unspecified: Secondary | ICD-10-CM | POA: Diagnosis present

## 2013-06-28 DIAGNOSIS — L0291 Cutaneous abscess, unspecified: Secondary | ICD-10-CM

## 2013-06-28 DIAGNOSIS — E119 Type 2 diabetes mellitus without complications: Secondary | ICD-10-CM | POA: Diagnosis present

## 2013-06-28 HISTORY — DX: Inflammatory disorders of scrotum: N49.2

## 2013-06-28 HISTORY — DX: Cellulitis, unspecified: L03.90

## 2013-06-28 HISTORY — DX: Cocaine abuse, uncomplicated: F14.10

## 2013-06-28 LAB — CBC WITH DIFFERENTIAL/PLATELET
Basophils Absolute: 0 10*3/uL (ref 0.0–0.1)
Basophils Relative: 0 % (ref 0–1)
Eosinophils Absolute: 0 10*3/uL (ref 0.0–0.7)
Eosinophils Relative: 0 % (ref 0–5)
HEMATOCRIT: 43.5 % (ref 39.0–52.0)
Hemoglobin: 14.9 g/dL (ref 13.0–17.0)
LYMPHS PCT: 11 % — AB (ref 12–46)
Lymphs Abs: 1.6 10*3/uL (ref 0.7–4.0)
MCH: 30.8 pg (ref 26.0–34.0)
MCHC: 34.3 g/dL (ref 30.0–36.0)
MCV: 90.1 fL (ref 78.0–100.0)
MONO ABS: 1 10*3/uL (ref 0.1–1.0)
Monocytes Relative: 6 % (ref 3–12)
Neutro Abs: 12.7 10*3/uL — ABNORMAL HIGH (ref 1.7–7.7)
Neutrophils Relative %: 83 % — ABNORMAL HIGH (ref 43–77)
Platelets: 324 10*3/uL (ref 150–400)
RBC: 4.83 MIL/uL (ref 4.22–5.81)
RDW: 12.8 % (ref 11.5–15.5)
WBC: 15.3 10*3/uL — AB (ref 4.0–10.5)

## 2013-06-28 LAB — BASIC METABOLIC PANEL
BUN: 15 mg/dL (ref 6–23)
CALCIUM: 9.6 mg/dL (ref 8.4–10.5)
CO2: 25 mEq/L (ref 19–32)
Chloride: 91 mEq/L — ABNORMAL LOW (ref 96–112)
Creatinine, Ser: 1.07 mg/dL (ref 0.50–1.35)
GFR calc Af Amer: >90 mL/min (ref >90)
GFR calc non Af Amer: 81 mL/min — ABNORMAL LOW (ref >90)
Glucose, Bld: 314 mg/dL — ABNORMAL HIGH (ref 70–99)
Potassium: 4.5 mEq/L (ref 3.7–5.3)
Sodium: 129 mEq/L — ABNORMAL LOW (ref 137–147)

## 2013-06-28 LAB — GLUCOSE, CAPILLARY: Glucose-Capillary: 288 mg/dL — ABNORMAL HIGH (ref 70–99)

## 2013-06-28 LAB — URINE MICROSCOPIC-ADD ON

## 2013-06-28 LAB — URINALYSIS, ROUTINE W REFLEX MICROSCOPIC
Bilirubin Urine: NEGATIVE
Glucose, UA: >1000 mg/dL — AB
HGB URINE DIPSTICK: NEGATIVE
Ketones, ur: 15 mg/dL — AB
Leukocytes, UA: NEGATIVE
Nitrite: NEGATIVE
PROTEIN: 30 mg/dL — AB
Specific Gravity, Urine: 1.042 — ABNORMAL HIGH (ref 1.005–1.030)
Urobilinogen, UA: 0.2 mg/dL (ref 0.0–1.0)
pH: 6.5 (ref 5.0–8.0)

## 2013-06-28 LAB — CBG MONITORING, ED
GLUCOSE-CAPILLARY: 302 mg/dL — AB (ref 70–99)
Glucose-Capillary: 217 mg/dL — ABNORMAL HIGH (ref 70–99)

## 2013-06-28 MED ORDER — HYDROMORPHONE HCL PF 1 MG/ML IJ SOLN
0.5000 mg | INTRAMUSCULAR | Status: DC | PRN
  Administered 2013-06-28 – 2013-07-01 (×8): 1 mg via INTRAVENOUS
  Filled 2013-06-28 (×8): qty 1

## 2013-06-28 MED ORDER — INSULIN DETEMIR 100 UNIT/ML ~~LOC~~ SOLN
20.0000 [IU] | Freq: Two times a day (BID) | SUBCUTANEOUS | Status: DC
  Administered 2013-06-28 – 2013-06-30 (×4): 20 [IU] via SUBCUTANEOUS
  Filled 2013-06-28 (×5): qty 0.2

## 2013-06-28 MED ORDER — IBUPROFEN 800 MG PO TABS
ORAL_TABLET | ORAL | Status: AC
  Filled 2013-06-28: qty 1

## 2013-06-28 MED ORDER — VANCOMYCIN HCL 10 G IV SOLR
1250.0000 mg | Freq: Two times a day (BID) | INTRAVENOUS | Status: DC
  Administered 2013-06-29 – 2013-07-01 (×5): 1250 mg via INTRAVENOUS
  Filled 2013-06-28 (×7): qty 1250

## 2013-06-28 MED ORDER — ONDANSETRON HCL 4 MG/2ML IJ SOLN: 4.0000 mg | Freq: Four times a day (QID) | INTRAMUSCULAR | Status: DC | PRN

## 2013-06-28 MED ORDER — ONDANSETRON HCL 4 MG/2ML IJ SOLN
4.0000 mg | Freq: Once | INTRAMUSCULAR | Status: AC
  Administered 2013-06-28: 4 mg via INTRAVENOUS
  Filled 2013-06-28: qty 2

## 2013-06-28 MED ORDER — OXYCODONE HCL 5 MG PO TABS
5.0000 mg | ORAL_TABLET | ORAL | Status: DC | PRN
  Administered 2013-06-29 – 2013-07-01 (×4): 5 mg via ORAL
  Filled 2013-06-28 (×4): qty 1

## 2013-06-28 MED ORDER — IOHEXOL 300 MG/ML  SOLN
75.0000 mL | Freq: Once | INTRAMUSCULAR | Status: AC | PRN
  Administered 2013-06-28: 75 mL via INTRAVENOUS

## 2013-06-28 MED ORDER — SODIUM CHLORIDE 0.9 % IV BOLUS (SEPSIS)
1000.0000 mL | Freq: Once | INTRAVENOUS | Status: AC
  Administered 2013-06-28: 1000 mL via INTRAVENOUS

## 2013-06-28 MED ORDER — VANCOMYCIN HCL IN DEXTROSE 1-5 GM/200ML-% IV SOLN
1000.0000 mg | Freq: Once | INTRAVENOUS | Status: AC
  Administered 2013-06-28: 1000 mg via INTRAVENOUS
  Filled 2013-06-28: qty 200

## 2013-06-28 MED ORDER — ACETAMINOPHEN 325 MG PO TABS: 650.0000 mg | ORAL_TABLET | Freq: Four times a day (QID) | ORAL | Status: DC | PRN

## 2013-06-28 MED ORDER — HYDROCODONE-ACETAMINOPHEN 5-325 MG PO TABS
ORAL_TABLET | ORAL | Status: AC
  Filled 2013-06-28: qty 2

## 2013-06-28 MED ORDER — HYDROCODONE-ACETAMINOPHEN 5-325 MG PO TABS
2.0000 | ORAL_TABLET | Freq: Once | ORAL | Status: AC
  Administered 2013-06-28: 2 via ORAL

## 2013-06-28 MED ORDER — SODIUM CHLORIDE 0.9 % IV SOLN
INTRAVENOUS | Status: DC
  Administered 2013-06-28 – 2013-06-30 (×3): via INTRAVENOUS

## 2013-06-28 MED ORDER — ONDANSETRON HCL 4 MG PO TABS: 4.0000 mg | ORAL_TABLET | Freq: Four times a day (QID) | ORAL | Status: DC | PRN

## 2013-06-28 MED ORDER — ACETAMINOPHEN 650 MG RE SUPP
650.0000 mg | Freq: Four times a day (QID) | RECTAL | Status: DC | PRN
Start: 2013-06-28 — End: 2013-07-01

## 2013-06-28 MED ORDER — IOHEXOL 300 MG/ML  SOLN
100.0000 mL | Freq: Once | INTRAMUSCULAR | Status: AC | PRN
  Administered 2013-06-28: 100 mL via INTRAVENOUS

## 2013-06-28 MED ORDER — ALUM & MAG HYDROXIDE-SIMETH 200-200-20 MG/5ML PO SUSP: 30.0000 mL | Freq: Four times a day (QID) | ORAL | Status: DC | PRN

## 2013-06-28 MED ORDER — IBUPROFEN 800 MG PO TABS
800.0000 mg | ORAL_TABLET | Freq: Once | ORAL | Status: AC
  Administered 2013-06-28: 800 mg via ORAL

## 2013-06-28 MED ORDER — IOHEXOL 300 MG/ML  SOLN
25.0000 mL | INTRAMUSCULAR | Status: AC
  Administered 2013-06-28: 25 mL via ORAL

## 2013-06-28 MED ORDER — PIPERACILLIN-TAZOBACTAM 3.375 G IVPB
3.3750 g | Freq: Three times a day (TID) | INTRAVENOUS | Status: DC
  Administered 2013-06-28 – 2013-07-01 (×8): 3.375 g via INTRAVENOUS
  Filled 2013-06-28 (×11): qty 50

## 2013-06-28 MED ORDER — PIPERACILLIN-TAZOBACTAM 3.375 G IVPB
3.3750 g | Freq: Once | INTRAVENOUS | Status: AC
  Administered 2013-06-28: 3.375 g via INTRAVENOUS
  Filled 2013-06-28: qty 50

## 2013-06-28 MED ORDER — LISINOPRIL 10 MG PO TABS
10.0000 mg | ORAL_TABLET | Freq: Every day | ORAL | Status: DC
  Administered 2013-06-29 – 2013-07-01 (×3): 10 mg via ORAL
  Filled 2013-06-28 (×3): qty 1

## 2013-06-28 MED ORDER — INSULIN ASPART 100 UNIT/ML ~~LOC~~ SOLN
0.0000 [IU] | SUBCUTANEOUS | Status: DC
  Administered 2013-06-28: 5 [IU] via SUBCUTANEOUS
  Administered 2013-06-29: 3 [IU] via SUBCUTANEOUS
  Administered 2013-06-29: 5 [IU] via SUBCUTANEOUS
  Administered 2013-06-29: 3 [IU] via SUBCUTANEOUS
  Administered 2013-06-29: 1 [IU] via SUBCUTANEOUS
  Administered 2013-06-30 (×2): 2 [IU] via SUBCUTANEOUS
  Administered 2013-06-30: 3 [IU] via SUBCUTANEOUS
  Administered 2013-06-30: 1 [IU] via SUBCUTANEOUS
  Administered 2013-06-30 – 2013-07-01 (×2): 2 [IU] via SUBCUTANEOUS
  Administered 2013-07-01: 3 [IU] via SUBCUTANEOUS
  Administered 2013-07-01: 1 [IU] via SUBCUTANEOUS

## 2013-06-28 MED ORDER — LORAZEPAM 2 MG/ML IJ SOLN: 0.5000 mg | INTRAMUSCULAR | Status: DC | PRN

## 2013-06-28 MED ORDER — HYDROMORPHONE HCL PF 1 MG/ML IJ SOLN
0.5000 mg | Freq: Once | INTRAMUSCULAR | Status: AC
  Administered 2013-06-28: 0.5 mg via INTRAVENOUS
  Filled 2013-06-28: qty 1

## 2013-06-28 NOTE — ED Notes (Signed)
Pt sent here from [facility] with mass in testicle and has been getting more painful and swollen over the past few days.

## 2013-06-28 NOTE — ED Notes (Signed)
Patient reports pain and a knot in groin area.  Noticed this 2 days ago.  Denies any history of the same.  Patient has great difficulty walking.  Knot is described to be in scrotum and getting larger and more painful.

## 2013-06-28 NOTE — ED Notes (Signed)
CT aware patient is finished with constrast

## 2013-06-28 NOTE — ED Notes (Signed)
Hooked pt back up to phillips monitor after returning from radiology. Pt currently being monitored on blood pressure and pulse ox.

## 2013-06-28 NOTE — ED Notes (Signed)
Provided pt with turkey sandwich and sprite 

## 2013-06-28 NOTE — ED Provider Notes (Signed)
CSN: [id number]     Arrival date & time 06/28/13  1221 History   First MD Initiated Contact with Patient 06/28/13 1228     Chief Complaint  Patient presents with  . Groin Pain     (Consider location/radiation/quality/duration/timing/severity/associated sxs/prior Treatment) HPI  Patient presents to the ED with complaints of left inguinal pain that started a few days ago and has significantly been getting worse. He feels like it is quickly getting worse and hurts to his left testicle.  The patient is a diabetic and diaphoretic and appears ill. He has not had any dysuria, hematuria, normal bowel movements, no penile discharge.  Patient is tachycardic at 125 and has as BP of 132/80.    Past Medical History  Diagnosis Date  . Diabetes mellitus   . Sleep apnea    Past Surgical History  Procedure Laterality Date  . Tonsillectomy     History reviewed. No pertinent family history. History  Substance Use Topics  . Smoking status: Current Some Day Smoker    Types: Cigars  . Smokeless tobacco: Not on file  . Alcohol Use: 1.8 oz/week    1 Glasses of wine, 1 Cans of beer, 1 Shots of liquor per week     Comment: Occasionally    Review of Systems   Review of Systems  Gen: no weight loss, fevers, chills, night sweats  Eyes: no discharge or drainage, no occular pain or visual changes  Nose: no epistaxis or rhinorrhea  Mouth: no dental pain, no sore throat  Neck: no neck pain  Lungs:No wheezing, coughing or hemoptysis CV: no chest pain, palpitations, dependent edema or orthopnea  Abd: no abdominal pain, nausea, vomiting, diarrhea GU: + left inguinal pain MSK:  No muscle weakness or pain Neuro: no headache, no focal neurologic deficits  Skin: no rash or wounds Psyche: no complaints    Allergies  Review of patient's allergies indicates no known allergies.  Home Medications   Prior to Admission medications   Medication Sig Start Date End Date Taking? Authorizing Provider   acetaminophen (TYLENOL) 500 MG tablet Take 1,000 mg by mouth daily as needed for mild pain.   Yes Historical Provider, MD  glipiZIDE (GLUCOTROL XL) 5 MG 24 hr tablet Take 5 mg by mouth daily with breakfast.   Yes Historical Provider, MD  insulin aspart (NOVOLOG) 100 UNIT/ML injection Inject 4-12 Units into the skin 2 (two) times daily before a meal. Sliding scale   Yes Historical Provider, MD  insulin detemir (LEVEMIR) 100 UNIT/ML injection Inject 20 Units into the skin 2 (two) times daily.   Yes Historical Provider, MD  lisinopril (PRINIVIL,ZESTRIL) 10 MG tablet Take 10 mg by mouth daily.   Yes Historical Provider, MD  metFORMIN (GLUCOPHAGE) 500 MG tablet Take 500 mg by mouth 2 (two) times daily with a meal.   Yes Historical Provider, MD  Multiple Vitamins-Minerals (MULTIVITAMIN PO) Take 1 tablet by mouth daily.   Yes Historical Provider, MD   BP 104/72  Pulse 106  Temp(Src) 98.4 F (36.9 C) (Oral)  Resp 18  Ht 5\' 7"  (1.702 m)  Wt 200 lb (90.719 kg)  BMI 31.32 kg/m2  SpO2 94% Physical Exam Nursing note and vitals reviewed.   Constitutional: He is oriented to person, place, and time. He appears well-developed and well-nourished. No distress.  HENT:  Head: Normocephalic.  Pulmonary/Chest: Effort normal. No respiratory distress.  Genitourinary: Left testis shows mass (extremely tender mass, likely protruding from inguinal ring, with tenderness superior to this  up to the mons pubis along left inguinal canal ) and swelling.  Neurological: He is alert and oriented to person, place, and time. Coordination normal.  Skin: Skin is warm and dry. No rash noted. He is not diaphoretic.  Psychiatric: He has a normal mood and affect. Judgment normal.   ED Course  Procedures (including critical care time) Labs Review Labs Reviewed  CBC WITH DIFFERENTIAL - Abnormal; Notable for the following:    WBC 15.3 (*)    Neutrophils Relative % 83 (*)    Neutro Abs 12.7 (*)    Lymphocytes Relative 11 (*)     All other components within normal limits  BASIC METABOLIC PANEL - Abnormal; Notable for the following:    Sodium 129 (*)    Chloride 91 (*)    Glucose, Bld 314 (*)    GFR calc non Af Amer 81 (*)    All other components within normal limits  URINALYSIS, ROUTINE W REFLEX MICROSCOPIC - Abnormal; Notable for the following:    Specific Gravity, Urine 1.042 (*)    Glucose, UA >1000 (*)    Ketones, ur 15 (*)    Protein, ur 30 (*)    All other components within normal limits  CBG MONITORING, ED - Abnormal; Notable for the following:    Glucose-Capillary 302 (*)    All other components within normal limits  CBG MONITORING, ED - Abnormal; Notable for the following:    Glucose-Capillary 217 (*)    All other components within normal limits  CULTURE, BLOOD (ROUTINE X 2)  CULTURE, BLOOD (ROUTINE X 2)  URINE MICROSCOPIC-ADD ON    Imaging Review US Scrotum  06/28/2013   CLINICAL DATA:  Pain and swelling in scrotum on LEFT, began 3 days ago, progressively more swollen and painful  EXAM: SCROTAL ULTRASOUND  DOPPLER ULTRASOUND OF THE TESTICLES  TECHNIQUE: Complete ultrasound examination of the testicles, epididymis, and other scrotal structures was performed. Color and spectral Doppler ultrasound were also utilized to evaluate blood flow to the testicles.  COMPARISON:  None.  None  FINDINGS: Right testicle  Measurements: 3.7 x 2.1 x 3.1 cm. Normal morphology without mass or calcification. Internal blood flow present on color Doppler imaging.  Left testicle  Measurements: 3.0 x 2.1 x 2.8 cm. Normal morphology without mass or calcification. Internal blood flow present on color Doppler imaging.  Right epididymis:  Normal in size and appearance.  Left epididymis:  Normal in size and appearance.  Hydrocele:  Small RIGHT hydrocele.  Varicocele:  Absent bilaterally  Pulsed Doppler interrogation of both testes demonstrates low resistance arterial and venous waveforms bilaterally.  Incidentally noted are multiple  normal-sized lymph nodes at LEFT inguinal canal at general region of pain. In addition, marked wall thickening of the wall of the upper LEFT scrotum is identified, up to 1.7 cm 6 over an area of 7.4 cm greatest diameter suggesting focal inflammatory process.  IMPRESSION: Unremarkable testes.  Small RIGHT hydrocele.  No evidence of testicular torsion or mass.  Significant wall thickening of the upper LEFT scrotum suggesting an inflammatory process/cellulitis with few likely reactive LEFT inguinal lymph nodes noted.   Electronically Signed   By: Lavonia Dana M.D.   On: 06/28/2013 13:52   [place] Art/ven Flow Abd Pelv Doppler  06/28/2013   CLINICAL DATA:  Pain and swelling in scrotum on LEFT, began 3 days ago, progressively more swollen and painful  EXAM: SCROTAL ULTRASOUND  DOPPLER ULTRASOUND OF THE TESTICLES  TECHNIQUE: Complete ultrasound examination of the  testicles, epididymis, and other scrotal structures was performed. Color and spectral Doppler ultrasound were also utilized to evaluate blood flow to the testicles.  COMPARISON:  None.  None  FINDINGS: Right testicle  Measurements: 3.7 x 2.1 x 3.1 cm. Normal morphology without mass or calcification. Internal blood flow present on color Doppler imaging.  Left testicle  Measurements: 3.0 x 2.1 x 2.8 cm. Normal morphology without mass or calcification. Internal blood flow present on color Doppler imaging.  Right epididymis:  Normal in size and appearance.  Left epididymis:  Normal in size and appearance.  Hydrocele:  Small RIGHT hydrocele.  Varicocele:  Absent bilaterally  Pulsed Doppler interrogation of both testes demonstrates low resistance arterial and venous waveforms bilaterally.  Incidentally noted are multiple normal-sized lymph nodes at LEFT inguinal canal at general region of pain. In addition, marked wall thickening of the wall of the upper LEFT scrotum is identified, up to 1.7 cm 6 over an area of 7.4 cm greatest diameter suggesting focal inflammatory  process.  IMPRESSION: Unremarkable testes.  Small RIGHT hydrocele.  No evidence of testicular torsion or mass.  Significant wall thickening of the upper LEFT scrotum suggesting an inflammatory process/cellulitis with few likely reactive LEFT inguinal lymph nodes noted.   Electronically Signed   By: Lavonia Dana M.D.   On: 06/28/2013 13:52     EKG Interpretation None      MDM   Final diagnoses:  None   3:08 pm Patients imaging shows that he has possible abscess vs cellulitis to left inguinal region. Dr. Betsey Holiday has seen patient as well. Concerned because he looks ill, will take blood cultures and give IV Vanc and Zosyn. Abd/pelv CT w contrast  Pending for further evaluation. 3:15pm At end of shift, patient has been handed off to oncoming ER resident, Dr. [name].    Linus Mako, PA-C 06/28/13 1540

## 2013-06-28 NOTE — H&P (Signed)
[facility] History and Physical  Kase Mueth [id number] DOB: Sep 23, 1965 DOA: 06/28/2013  Referring physician:  EDP PCP: Pcp Not In System  Specialists:   Chief Complaint:   HPI: Malak Tremel is a 48 y.o. male with a history of DM2 who presents to the ED with complaints of increased pain and swelling of his scrotum worsening over the past 3 days.   He denies dysuria, and fevers and chills.   He has  An Ultrasound done as outpatient and a Ct scan performed in the ED and was diagnosed with  Left Scrotal cellulitis and reactive lymph nodes; of  Note he was also found to have and small right hydrocele.   He was placed on IV Vancomycin and Zosyn and referred for medical admission.       Review of Systems:  Constitutional: No Weight Loss, No Weight Gain, Night Sweats, Fevers, Chills, Fatigue, or Generalized Weakness HEENT: No Headaches, Difficulty Swallowing,Tooth/Dental Problems,Sore Throat,  No Sneezing, Rhinitis, Ear Ache, Nasal Congestion, or Post Nasal Drip,  Cardio-vascular:  No Chest pain, Orthopnea, PND, Edema in lower extremities, Anasarca, Dizziness, Palpitations  Resp: No Dyspnea, No DOE, No Cough, No Hemoptysis, No Wheezing.    GI: No Heartburn, Indigestion, Abdominal Pain, Nausea, Vomiting, Diarrhea, Change in Bowel Habits,  Loss of Appetite  GU: +Pain and Swelling Left Scrotum, No Dysuria, Change in Color of Urine, No Urgency or Frequency.  No Flank pain.  Musculoskeletal: No Joint Pain or Swelling.  No Decreased Range of Motion. No Back Pain.  Neurologic: No Syncope, No Seizures, Muscle Weakness, Paresthesia, Vision Disturbance or Loss, No Diplopia, No Vertigo, No Difficulty Walking,  Skin: No Rash or Lesions. Psych: No Change in Mood or Affect. No Depression or Anxiety. No Memory loss. No Confusion or Hallucinations   Past Medical History  Diagnosis Date  . Diabetes mellitus   . Sleep apnea       Past Surgical History  Procedure Laterality Date  .  Tonsillectomy         Prior to Admission medications   Medication Sig Start Date End Date Taking? Authorizing Provider  acetaminophen (TYLENOL) 500 MG tablet Take 1,000 mg by mouth daily as needed for mild pain.   Yes Historical Provider, MD  glipiZIDE (GLUCOTROL XL) 5 MG 24 hr tablet Take 5 mg by mouth daily with breakfast.   Yes Historical Provider, MD  insulin aspart (NOVOLOG) 100 UNIT/ML injection Inject 4-12 Units into the skin 2 (two) times daily before a meal. Sliding scale   Yes Historical Provider, MD  insulin detemir (LEVEMIR) 100 UNIT/ML injection Inject 20 Units into the skin 2 (two) times daily.   Yes Historical Provider, MD  lisinopril (PRINIVIL,ZESTRIL) 10 MG tablet Take 10 mg by mouth daily.   Yes Historical Provider, MD  metFORMIN (GLUCOPHAGE) 500 MG tablet Take 500 mg by mouth 2 (two) times daily with a meal.   Yes Historical Provider, MD  Multiple Vitamins-Minerals (MULTIVITAMIN PO) Take 1 tablet by mouth daily.   Yes Historical Provider, MD      No Known Allergies   Social History:  reports that he has been smoking Cigars.  He does not have any smokeless tobacco history on file. He reports that he drinks about 1.8 ounces of alcohol per week. He reports that he uses illicit drugs (Marijuana and Cocaine).     Family History  Problem Relation Age of Onset  . Hypertension Mother   . Hypertension Father   . Diabetes Father  Physical Exam:  GEN:  Pleasant Obese 48 y.o. African American male examined and in no acute distress; cooperative with exam Filed Vitals:   06/28/13 1500 06/28/13 1654 06/28/13 1745 06/28/13 1800  BP: 104/72 110/72 110/59 118/65  Pulse: 106 95 96 93  Temp:      TempSrc:      Resp:  16 18   Height:      Weight:      SpO2: 94% 99% 96% 99%   Blood pressure 118/65, pulse 93, temperature 98.4 F (36.9 C), temperature source Oral, resp. rate 18, height 5\' 7"  (1.702 m), weight 90.719 kg (200 lb), SpO2 99.00%. PSYCH: He is alert and  oriented x4; does not appear anxious does not appear depressed; affect is normal HEENT: Normocephalic and Atraumatic, Mucous membranes pink; PERRLA; EOM intact; Fundi:  Benign;  No scleral icterus, Nares: Patent, Oropharynx: Clear, Fair Dentition, Neck:  FROM, no cervical lymphadenopathy nor thyromegaly or carotid bruit; no JVD; Breasts:: Not examined CHEST WALL: No tenderness CHEST: Normal respiration, clear to auscultation bilaterally HEART: Regular rate and rhythm; no murmurs rubs or gallops BACK: No kyphosis or scoliosis; no CVA tenderness ABDOMEN: Positive Bowel Sounds, Obese, soft non-tender; no masses, no organomegaly, no pannus; no intertriginous candida. Rectal Exam: Not done EXTREMITIES: No cyanosis, clubbing or edema; no ulcerations. Genitalia: not examined PULSES: 2+ and symmetric SKIN: Normal hydration no rash or ulceration CNS:  Alert and Oriented X 4,  No Focal Deficits.    Vascular: pulses palpable throughout    Labs on Admission:  Basic Metabolic Panel:  Recent Labs Lab 06/28/13 1236  NA 129*  K 4.5  CL 91*  CO2 25  GLUCOSE 314*  BUN 15  CREATININE 1.07  CALCIUM 9.6   Liver Function Tests: No results found for this basename: AST, ALT, ALKPHOS, BILITOT, PROT, ALBUMIN,  in the last 168 hours No results found for this basename: LIPASE, AMYLASE,  in the last 168 hours No results found for this basename: AMMONIA,  in the last 168 hours CBC:  Recent Labs Lab 06/28/13 1236  WBC 15.3*  NEUTROABS 12.7*  HGB 14.9  HCT 43.5  MCV 90.1  PLT 324   Cardiac Enzymes: No results found for this basename: CKTOTAL, CKMB, CKMBINDEX, TROPONINI,  in the last 168 hours  BNP (last 3 results) No results found for this basename: PROBNP,  in the last 8760 hours CBG:  Recent Labs Lab 06/28/13 1243 06/28/13 1449  GLUCAP 302* 217*    Radiological Exams on Admission: Ct Pelvis W Contrast  06/28/2013   CLINICAL DATA:  Pain and swelling in the left side of the scrotum.   EXAM: CT PELVIS AND UPPER THIGHS INCLUDING THE SCROTUM WITH CONTRAST  TECHNIQUE: Multidetector CT imaging of the pelvis and upper thighs including the scrotum was performed using the standard protocol following the bolus administration of intravenous contrast.  CONTRAST:  75mL OMNIPAQUE IOHEXOL 300 MG/ML  SOLN  COMPARISON:  CT SCAN OF THE PELVIS DATED 06/28/2013  FINDINGS: There is marked thickening of the wall of the scrotum. There is no inguinal hernia. Testicles are not enlarged. There is soft tissue stranding in the subcutaneous fat extending asymmetric to the left. There is no inguinal hernia or mass. There is reactive adenopathy in both inguinal regions. Penis appears normal. The soft tissue stranding, consistent with cellulitis, extends toward the left inguinal region.  The structures in the pelvis appear normal including the bladder. No osseous abnormality.  IMPRESSION: Cellulitis of the scrotum extending toward  the left inguinal region. The patient is at risk for developing Fournier's gangrene. However, at this time there is no involvement of the perineum.   Electronically Signed   By: Rozetta Nunnery M.D.   On: 06/28/2013 19:55   US Scrotum  06/28/2013   CLINICAL DATA:  Pain and swelling in scrotum on LEFT, began 3 days ago, progressively more swollen and painful  EXAM: SCROTAL ULTRASOUND  DOPPLER ULTRASOUND OF THE TESTICLES  TECHNIQUE: Complete ultrasound examination of the testicles, epididymis, and other scrotal structures was performed. Color and spectral Doppler ultrasound were also utilized to evaluate blood flow to the testicles.  COMPARISON:  None.  None  FINDINGS: Right testicle  Measurements: 3.7 x 2.1 x 3.1 cm. Normal morphology without mass or calcification. Internal blood flow present on color Doppler imaging.  Left testicle  Measurements: 3.0 x 2.1 x 2.8 cm. Normal morphology without mass or calcification. Internal blood flow present on color Doppler imaging.  Right epididymis:  Normal in  size and appearance.  Left epididymis:  Normal in size and appearance.  Hydrocele:  Small RIGHT hydrocele.  Varicocele:  Absent bilaterally  Pulsed Doppler interrogation of both testes demonstrates low resistance arterial and venous waveforms bilaterally.  Incidentally noted are multiple normal-sized lymph nodes at LEFT inguinal canal at general region of pain. In addition, marked wall thickening of the wall of the upper LEFT scrotum is identified, up to 1.7 cm 6 over an area of 7.4 cm greatest diameter suggesting focal inflammatory process.  IMPRESSION: Unremarkable testes.  Small RIGHT hydrocele.  No evidence of testicular torsion or mass.  Significant wall thickening of the upper LEFT scrotum suggesting an inflammatory process/cellulitis with few likely reactive LEFT inguinal lymph nodes noted.   Electronically Signed   By: Lavonia Dana M.D.   On: 06/28/2013 13:52   Ct Abdomen Pelvis W Contrast  06/28/2013   CLINICAL DATA:  Pain and swelling left scrotum  EXAM: CT ABDOMEN AND PELVIS WITH CONTRAST  TECHNIQUE: Multidetector CT imaging of the abdomen and pelvis was performed using the standard protocol following bolus administration of intravenous contrast.  CONTRAST:  164mL OMNIPAQUE IOHEXOL 300 MG/ML  SOLN  COMPARISON:  Ultrasound of the scrotum same day and CT of the chest 12/09/2012  FINDINGS: Lung bases are unremarkable. Sagittal images of the spine shows mild disc space flattening at L5-S1 level. There is disc bulge at L5-S1 level.  Liver shows no focal mass.  Mild hepatic fatty infiltration.  The pancreas, spleen and left adrenal is unremarkable. A right adrenal nodule measures 2 cm stable in size in appearance from prior exam. A right adrenal nodule measures 2 cm stable in size in appearance from prior exam. No calcified gallstones are noted within gallbladder.  No small bowel obstruction. No aortic aneurysm. No ascites or free air. Normal appendix. No pericecal inflammation. The terminal ileum is  unremarkable. The urinary bladder is unremarkable. Prostate gland and seminal vesicles are unremarkable. Kidneys are symmetrical in size and enhancement. No hydronephrosis or hydroureter.  Delayed renal images shows bilateral renal symmetrical excretion. Bilateral visualized proximal ureter is unremarkable.  No pelvic ascites or adenopathy. A right inguinal lymph node measures 1.6 by 0.9 cm. A left inguinal lymph node measures 1.7 x 1.4 cm. Second left inguinal lymph node measures 1.4 x 1 cm. A third left inguinal lymph node measures 1.7 by 1.2 cm. These are probable reactive. No evidence of inguinal hernia or inguinal mass.  IMPRESSION: 1. Mild hepatic fatty infiltration.  No focal  hepatic mass. 2. Stable nodular lesion right adrenal gland probable benign in nature. 3. Normal appendix.  No pericecal inflammation. 4. There are mild enlarged left inguinal lymph nodes probable reactive in nature. 5. Unremarkable urinary bladder. No hydronephrosis or hydroureter. Bilateral renal symmetrical excretion.   Electronically Signed   By: Lahoma Crocker M.D.   On: 06/28/2013 17:55   [place] Art/ven Flow Abd Pelv Doppler  06/28/2013   CLINICAL DATA:  Pain and swelling in scrotum on LEFT, began 3 days ago, progressively more swollen and painful  EXAM: SCROTAL ULTRASOUND  DOPPLER ULTRASOUND OF THE TESTICLES  TECHNIQUE: Complete ultrasound examination of the testicles, epididymis, and other scrotal structures was performed. Color and spectral Doppler ultrasound were also utilized to evaluate blood flow to the testicles.  COMPARISON:  None.  None  FINDINGS: Right testicle  Measurements: 3.7 x 2.1 x 3.1 cm. Normal morphology without mass or calcification. Internal blood flow present on color Doppler imaging.  Left testicle  Measurements: 3.0 x 2.1 x 2.8 cm. Normal morphology without mass or calcification. Internal blood flow present on color Doppler imaging.  Right epididymis:  Normal in size and appearance.  Left epididymis:  Normal in  size and appearance.  Hydrocele:  Small RIGHT hydrocele.  Varicocele:  Absent bilaterally  Pulsed Doppler interrogation of both testes demonstrates low resistance arterial and venous waveforms bilaterally.  Incidentally noted are multiple normal-sized lymph nodes at LEFT inguinal canal at general region of pain. In addition, marked wall thickening of the wall of the upper LEFT scrotum is identified, up to 1.7 cm 6 over an area of 7.4 cm greatest diameter suggesting focal inflammatory process.  IMPRESSION: Unremarkable testes.  Small RIGHT hydrocele.  No evidence of testicular torsion or mass.  Significant wall thickening of the upper LEFT scrotum suggesting an inflammatory process/cellulitis with few likely reactive LEFT inguinal lymph nodes noted.   Electronically Signed   By: Lavonia Dana M.D.   On: 06/28/2013 13:52       Assessment/Plan:   48 y.o. male with  Active Problems:   Scrotal infection   Cellulitis   Diabetes mellitus, type 2   Cocaine abuse   Marijuana abuse   Leukocytosis   Hyponatremia     1.    Scrotal Infection/Cellulitis-   IV Vancomycin and Zosyn, and  Pain Control PRN.   May need Urology Consult.        2.    DM2-  Continue Lantus Insulin, and SSI coverage PRN,  Check HbA1c in AM.     3.    Cocaine and Marijuana Abuse-   Counseled Re Cessation;  PRN IV Ativan  Ordered.    4.     Leukocytosis-  On IV Antibiotics,  Monitor Trend.     5.     Hyponatremia- IVFs with NSS, Monitor Na+ level.   Send Urine Na+.   6.    DVT prophylaxis with SCDs.        Code Status: FULL CODE Family Communication:  Family at Bedside Disposition Plan:   Inpatient  Time spent:  60 minutes  [facility] Pager [phone]  If 7PM-7AM, please contact night-coverage [email or web address] Password [facility] 06/28/2013, 9:29 PM

## 2013-06-28 NOTE — ED Provider Notes (Signed)
CSN: [id number]     Arrival date & time 06/28/13  1113 History   First MD Initiated Contact with Patient 06/28/13 1134     No chief complaint on file.  (Consider location/radiation/quality/duration/timing/severity/associated sxs/prior Treatment) HPI Comments: 48 year old male presents complaining pain and swelling in his scrotum on the left side. This started 3 days ago and has gotten progressively more swollen and painful. As of last night the pain is severe and is worse with any movement.  No fever, chills, NVD, abdominal pain.  No dysuria, hematuria.  Moving bowels normally.  No testicle pain.  No penile discharge.  No Hx of similar problems    Past Medical History  Diagnosis Date  . Diabetes mellitus   . Sleep apnea    Past Surgical History  Procedure Laterality Date  . Tonsillectomy     No family history on file. History  Substance Use Topics  . Smoking status: Current Some Day Smoker    Types: Cigars  . Smokeless tobacco: Not on file  . Alcohol Use: 1.8 oz/week    1 Glasses of wine, 1 Cans of beer, 1 Shots of liquor per week     Comment: Occasionally    Review of Systems  Gastrointestinal: Negative for nausea, vomiting and abdominal pain.  Genitourinary: Positive for scrotal swelling. Negative for testicular pain.  All other systems reviewed and are negative.   Allergies  Review of patient's allergies indicates no known allergies.  Home Medications   Prior to Admission medications   Medication Sig Start Date End Date Taking? Authorizing Provider  glipiZIDE (GLUCOTROL) 10 MG tablet Take 10 mg by mouth daily.     Historical Provider, MD  insulin aspart (NOVOLOG) 100 UNIT/ML injection Inject 4-12 Units into the skin 2 (two) times daily before a meal. Sliding scale    Historical Provider, MD  Insulin Detemir (LEVEMIR FLEXPEN) 100 UNIT/ML SOPN Inject 20 Units into the skin daily. 12/11/12   Charlynne Cousins, MD  insulin detemir (LEVEMIR) 100 UNIT/ML injection Inject  0.2 mLs (20 Units total) into the skin 2 (two) times daily. 12/11/12   Charlynne Cousins, MD  levofloxacin (LEVAQUIN) 750 MG tablet Take 1 tablet (750 mg total) by mouth daily. 12/11/12   Charlynne Cousins, MD  lisinopril (PRINIVIL,ZESTRIL) 10 MG tablet Take 10 mg by mouth daily.    Historical Provider, MD  metFORMIN (GLUCOPHAGE) 500 MG tablet Take 1 tablet (500 mg total) by mouth 2 (two) times daily with a meal. 08/02/12   Orpah Greek, MD  terbinafine (LAMISIL) 250 MG tablet  11/22/12   Historical Provider, MD   BP 132/80  Pulse 86  Temp(Src) 98.5 F (36.9 C) (Oral)  Resp 16  SpO2 98% Physical Exam  Nursing note and vitals reviewed. Constitutional: He is oriented to person, place, and time. He appears well-developed and well-nourished. No distress.  HENT:  Head: Normocephalic.  Pulmonary/Chest: Effort normal. No respiratory distress.  Genitourinary: Left testis shows mass (extremely tender mass, likely protruding from inguinal ring, with tenderness superior to this up to the mons pubis along left inguinal canal ) and swelling.  Neurological: He is alert and oriented to person, place, and time. Coordination normal.  Skin: Skin is warm and dry. No rash noted. He is not diaphoretic.  Psychiatric: He has a normal mood and affect. Judgment normal.    ED Course  Procedures (including critical care time) Labs Review Labs Reviewed - No data to display  Imaging Review No results found.  MDM   1. Scrotal pain   2. Scrotal swelling    48 year old male with swollen tender mass in scrotum, incarcerated indirect inguinal hernia versus epididymitis, needs imaging to further delineate, transferred via [place], PA-C 06/28/13 1202

## 2013-06-28 NOTE — ED Notes (Signed)
Pt transported to CT ?

## 2013-06-28 NOTE — ED Provider Notes (Signed)
Medical screening examination/treatment/procedure(s) were conducted as a shared visit with non-physician practitioner(s) and myself.  I personally evaluated the patient during the encounter.  Please see separate associated note for evaluation and plan.    EKG Interpretation None       Orpah Greek, MD 06/28/13 1544

## 2013-06-28 NOTE — ED Provider Notes (Signed)
Patient presented to the ER with pain and swelling in the left groin. Symptoms started 3 days ago and have progressively worsened. Patient referred to the ER from urgent care for further evaluation.  Face to face Exam: HEENT - PERRLA Lungs - CTAB Heart - RRR, no M/R/G Abd - S/NT/ND Neuro - alert, oriented x3 GU - tender mass in the left inguinal region. Mild overlying erythema and induration. Left testicle not swollen, no mass. Normal testicle on the right  Plan: Patient presents to the ER for evaluation of pain and swelling in the left groin. Patient is a diabetic, is hyperglycemic. There is no fever. Lab work revealed leukocytosis. Ultrasound performed to further evaluate for testicular mass, consistent with occlusion. No testicular abnormality noted, patient does have some swelling of the scrotum consistent with cellulitis. Examination reveals significant fullness and mass in the inguinal region above the testicle. CT scan to evaluate for possible abscess versus incarcerated hernia.   Orpah Greek, MD 06/28/13 [phone]

## 2013-06-28 NOTE — Progress Notes (Signed)
ANTIBIOTIC CONSULT NOTE - INITIAL  Pharmacy Consult for vancomycin Indication: cellulitis of scrotum  No Known Allergies  Patient Measurements: Height: 5\' 7"  (170.2 cm) Weight: 200 lb (90.719 kg) IBW/kg (Calculated) : 66.1 Adjusted Body Weight:   Vital Signs: Temp: 98.4 F (36.9 C) (05/22 1226) Temp src: Oral (05/22 1226) BP: 109/77 mmHg (05/22 2158) Pulse Rate: 116 (05/22 2158) Intake/Output from previous day:   Intake/Output from this shift:    Labs:  Recent Labs  06/28/13 1236  WBC 15.3*  HGB 14.9  PLT 324  CREATININE 1.07   Estimated Creatinine Clearance: 91.6 ml/min (by C-G formula based on Cr of 1.07). No results found for this basename: VANCOTROUGH, VANCOPEAK, VANCORANDOM, GENTTROUGH, GENTPEAK, GENTRANDOM, TOBRATROUGH, TOBRAPEAK, TOBRARND, AMIKACINPEAK, AMIKACINTROU, AMIKACIN,  in the last 72 hours   Microbiology: No results found for this or any previous visit (from the past 720 hour(s)).  Medical History: Past Medical History  Diagnosis Date  . Diabetes mellitus   . Sleep apnea     Medications:  Scheduled:  . [START ON 06/29/2013] insulin aspart  0-9 Units Subcutaneous 6 times per day  . insulin detemir  20 Units Subcutaneous BID  . [START ON 06/29/2013] lisinopril  10 mg Oral Daily  . piperacillin-tazobactam (ZOSYN)  IV  3.375 g Intravenous 3 times per day  . [START ON 06/29/2013] vancomycin  1,250 mg Intravenous Q12H   Assessment: 48 yr old male presented to the ED with pain and swelling in his scrotum that was worsening. He was admitted to receive IV antibiotics Zosyn and pharmacy to dose vancomycin.  Goal of Therapy:  Vancomycin trough level 10-15 mcg/ml  Plan:  Pt had one gram of vancomycin in the ED. Will follow with vancomycin 1250 mg IV q12 hrs. Levels when appropriate.   Ardith Dark Sophiah Rolin 06/28/2013,10:43 PM

## 2013-06-29 DIAGNOSIS — R6510 Systemic inflammatory response syndrome (SIRS) of non-infectious origin without acute organ dysfunction: Secondary | ICD-10-CM

## 2013-06-29 LAB — BASIC METABOLIC PANEL
BUN: 14 mg/dL (ref 6–23)
CO2: 27 meq/L (ref 19–32)
Calcium: 8.6 mg/dL (ref 8.4–10.5)
Chloride: 96 mEq/L (ref 96–112)
Creatinine, Ser: 1.22 mg/dL (ref 0.50–1.35)
GFR calc Af Amer: 80 mL/min — ABNORMAL LOW (ref >90)
GFR calc non Af Amer: 69 mL/min — ABNORMAL LOW (ref >90)
GLUCOSE: 222 mg/dL — AB (ref 70–99)
POTASSIUM: 4.2 meq/L (ref 3.7–5.3)
SODIUM: 135 meq/L — AB (ref 137–147)

## 2013-06-29 LAB — CBC
HCT: 40.2 % (ref 39.0–52.0)
HEMOGLOBIN: 13.1 g/dL (ref 13.0–17.0)
MCH: 30.2 pg (ref 26.0–34.0)
MCHC: 32.6 g/dL (ref 30.0–36.0)
MCV: 92.6 fL (ref 78.0–100.0)
Platelets: 320 10*3/uL (ref 150–400)
RBC: 4.34 MIL/uL (ref 4.22–5.81)
RDW: 13 % (ref 11.5–15.5)
WBC: 14.4 10*3/uL — ABNORMAL HIGH (ref 4.0–10.5)

## 2013-06-29 LAB — GLUCOSE, CAPILLARY
GLUCOSE-CAPILLARY: 117 mg/dL — AB (ref 70–99)
GLUCOSE-CAPILLARY: 143 mg/dL — AB (ref 70–99)
GLUCOSE-CAPILLARY: 268 mg/dL — AB (ref 70–99)
GLUCOSE-CAPILLARY: 161 mg/dL — AB (ref 70–99)
Glucose-Capillary: 219 mg/dL — ABNORMAL HIGH (ref 70–99)
Glucose-Capillary: 214 mg/dL — ABNORMAL HIGH (ref 70–99)

## 2013-06-29 LAB — HEMOGLOBIN A1C
Hgb A1c MFr Bld: 10.6 % — ABNORMAL HIGH (ref <5.7)
Mean Plasma Glucose: 258 mg/dL — ABNORMAL HIGH (ref <117)

## 2013-06-29 MED ORDER — HEPARIN SODIUM (PORCINE) 5000 UNIT/ML IJ SOLN
5000.0000 [IU] | Freq: Three times a day (TID) | INTRAMUSCULAR | Status: DC
  Administered 2013-06-29 – 2013-07-01 (×6): 5000 [IU] via SUBCUTANEOUS
  Filled 2013-06-29 (×11): qty 1

## 2013-06-29 NOTE — Progress Notes (Signed)
PATIENT DETAILS Name: Jonathan Austin Age: 48 y.o. Sex: male Date of Birth: 02-Jun-1965 Admit Date: 06/28/2013 Admitting Physician Theressa Millard, MD PCP:Pcp Not In System  Subjective: Still with significant left scrotal area pain. Afebrile overnight.  Assessment/Plan: Active Problems:   Left Scrotal Cellulitis -admitted and started on empiric Vanco/Zosyn-currently day 2.  -Urology consulted, no signs of needing surgery at this time -CT Pelvis confirms cellulitis, Ultrasound neg for torsion -continue with antibiotics and follow clinical course  SIR's -secondary to above -treat with IVF and Abx  Hyponatremia -likely secondary to Dehydration -resolved with IVF    Diabetes mellitus, type 2 -c/w Levemir and SSI-follow CBG's and adjust accordingly. Metformin on hold-resume on discharge  HTN -controlled with Lisinopril   Disposition: Remain inpatient  DVT Prophylaxis: Prophylactic Heparin   Code Status: Full code   Family Communication Girlfriend at bedside  Procedures:  None  CONSULTS:  urology  Time spent 40 minutes-which includes 50% of the time with face-to-face with patient/ family and coordinating care related to the above assessment and plan.    MEDICATIONS: Scheduled Meds: . insulin aspart  0-9 Units Subcutaneous 6 times per day  . insulin detemir  20 Units Subcutaneous BID  . lisinopril  10 mg Oral Daily  . piperacillin-tazobactam (ZOSYN)  IV  3.375 g Intravenous 3 times per day  . vancomycin  1,250 mg Intravenous Q12H   Continuous Infusions: . sodium chloride 100 mL/hr at 06/28/13 2255   PRN Meds:.acetaminophen, acetaminophen, alum & mag hydroxide-simeth, HYDROmorphone (DILAUDID) injection, LORazepam, ondansetron (ZOFRAN) IV, ondansetron, oxyCODONE  Antibiotics: Anti-infectives   Start     Dose/Rate Route Frequency Ordered Stop   06/29/13 0600  vancomycin (VANCOCIN) 1,250 mg in sodium chloride 0.9 % 250 mL IVPB     1,250  mg 166.7 mL/hr over 90 Minutes Intravenous Every 12 hours 06/28/13 2243     06/28/13 2230  piperacillin-tazobactam (ZOSYN) IVPB 3.375 g     3.375 g 12.5 mL/hr over 240 Minutes Intravenous 3 times per day 06/28/13 2229     06/28/13 1515  piperacillin-tazobactam (ZOSYN) IVPB 3.375 g     3.375 g 12.5 mL/hr over 240 Minutes Intravenous  Once 06/28/13 1506 06/28/13 1604   06/28/13 1515  vancomycin (VANCOCIN) IVPB 1000 mg/200 mL premix     1,000 mg 200 mL/hr over 60 Minutes Intravenous  Once 06/28/13 1506 06/28/13 1714       PHYSICAL EXAM: Vital signs in last 24 hours: Filed Vitals:   06/28/13 2308 06/29/13 0044 06/29/13 0300 06/29/13 0806  BP: 138/84  118/62 118/71  Pulse: 122 118 106 109  Temp: 99.1 F (37.3 C)  98.4 F (36.9 C) 98.3 F (36.8 C)  TempSrc: Oral  Oral Oral  Resp: 16 14 16 16   Height: 5\' 7"  (1.702 m)     Weight: 96.4 kg (212 lb 8.4 oz)  96.4 kg (212 lb 8.4 oz)   SpO2: 93% 94% 96% 97%    Weight change:  Filed Weights   06/28/13 1226 06/28/13 2308 06/29/13 0300  Weight: 90.719 kg (200 lb) 96.4 kg (212 lb 8.4 oz) 96.4 kg (212 lb 8.4 oz)   Body mass index is 33.28 kg/(m^2).   Gen Exam: Awake and alert with clear speech.   Neck: Supple, No JVD.   Chest: B/L Clear.   CVS: S1 S2 Regular, no murmurs.  Abdomen: soft, BS +, non tender, non distended. Left scrotal area indurated-mild tenderness in the groin. Extremities: no  edema, lower extremities warm to touch. Neurologic: Non Focal.   Skin: No Rash.   Wounds: N/A.    Intake/Output from previous day: No intake or output data in the 24 hours ending 06/29/13 1003   LAB RESULTS: CBC  Recent Labs Lab 06/28/13 1236 06/29/13 0420  WBC 15.3* 14.4*  HGB 14.9 13.1  HCT 43.5 40.2  PLT 324 320  MCV 90.1 92.6  MCH 30.8 30.2  MCHC 34.3 32.6  RDW 12.8 13.0  LYMPHSABS 1.6  --   MONOABS 1.0  --   EOSABS 0.0  --   BASOSABS 0.0  --     Chemistries   Recent Labs Lab 06/28/13 1236 06/29/13 0420  NA 129*  135*  K 4.5 4.2  CL 91* 96  CO2 25 27  GLUCOSE 314* 222*  BUN 15 14  CREATININE 1.07 1.22  CALCIUM 9.6 8.6    CBG:  Recent Labs Lab 06/28/13 1243 06/28/13 1449 06/28/13 2322 06/29/13 0440 06/29/13 0801  GLUCAP 302* 217* 288* 219* 214*    GFR Estimated Creatinine Clearance: 82.8 ml/min (by C-G formula based on Cr of 1.22).  Coagulation profile No results found for this basename: INR, PROTIME,  in the last 168 hours  Cardiac Enzymes No results found for this basename: CK, CKMB, TROPONINI, MYOGLOBIN,  in the last 168 hours  No components found with this basename: POCBNP,  No results found for this basename: DDIMER,  in the last 72 hours No results found for this basename: HGBA1C,  in the last 72 hours No results found for this basename: CHOL, HDL, LDLCALC, TRIG, CHOLHDL, LDLDIRECT,  in the last 72 hours No results found for this basename: TSH, T4TOTAL, FREET3, T3FREE, THYROIDAB,  in the last 72 hours No results found for this basename: VITAMINB12, FOLATE, FERRITIN, TIBC, IRON, RETICCTPCT,  in the last 72 hours No results found for this basename: LIPASE, AMYLASE,  in the last 72 hours  Urine Studies No results found for this basename: UACOL, UAPR, USPG, UPH, UTP, UGL, UKET, UBIL, UHGB, UNIT, UROB, ULEU, UEPI, UWBC, URBC, UBAC, CAST, CRYS, UCOM, BILUA,  in the last 72 hours  MICROBIOLOGY: No results found for this or any previous visit (from the past 240 hour(s)).  RADIOLOGY STUDIES/RESULTS: Ct Pelvis W Contrast  06/28/2013   CLINICAL DATA:  Pain and swelling in the left side of the scrotum.  EXAM: CT PELVIS AND UPPER THIGHS INCLUDING THE SCROTUM WITH CONTRAST  TECHNIQUE: Multidetector CT imaging of the pelvis and upper thighs including the scrotum was performed using the standard protocol following the bolus administration of intravenous contrast.  CONTRAST:  88mL OMNIPAQUE IOHEXOL 300 MG/ML  SOLN  COMPARISON:  CT SCAN OF THE PELVIS DATED 06/28/2013  FINDINGS: There is  marked thickening of the wall of the scrotum. There is no inguinal hernia. Testicles are not enlarged. There is soft tissue stranding in the subcutaneous fat extending asymmetric to the left. There is no inguinal hernia or mass. There is reactive adenopathy in both inguinal regions. Penis appears normal. The soft tissue stranding, consistent with cellulitis, extends toward the left inguinal region.  The structures in the pelvis appear normal including the bladder. No osseous abnormality.  IMPRESSION: Cellulitis of the scrotum extending toward the left inguinal region. The patient is at risk for developing Fournier's gangrene. However, at this time there is no involvement of the perineum.   Electronically Signed   By: Rozetta Nunnery M.D.   On: 06/28/2013 19:55   US Scrotum  06/28/2013   CLINICAL DATA:  Pain and swelling in scrotum on LEFT, began 3 days ago, progressively more swollen and painful  EXAM: SCROTAL ULTRASOUND  DOPPLER ULTRASOUND OF THE TESTICLES  TECHNIQUE: Complete ultrasound examination of the testicles, epididymis, and other scrotal structures was performed. Color and spectral Doppler ultrasound were also utilized to evaluate blood flow to the testicles.  COMPARISON:  None.  None  FINDINGS: Right testicle  Measurements: 3.7 x 2.1 x 3.1 cm. Normal morphology without mass or calcification. Internal blood flow present on color Doppler imaging.  Left testicle  Measurements: 3.0 x 2.1 x 2.8 cm. Normal morphology without mass or calcification. Internal blood flow present on color Doppler imaging.  Right epididymis:  Normal in size and appearance.  Left epididymis:  Normal in size and appearance.  Hydrocele:  Small RIGHT hydrocele.  Varicocele:  Absent bilaterally  Pulsed Doppler interrogation of both testes demonstrates low resistance arterial and venous waveforms bilaterally.  Incidentally noted are multiple normal-sized lymph nodes at LEFT inguinal canal at general region of pain. In addition, marked wall  thickening of the wall of the upper LEFT scrotum is identified, up to 1.7 cm 6 over an area of 7.4 cm greatest diameter suggesting focal inflammatory process.  IMPRESSION: Unremarkable testes.  Small RIGHT hydrocele.  No evidence of testicular torsion or mass.  Significant wall thickening of the upper LEFT scrotum suggesting an inflammatory process/cellulitis with few likely reactive LEFT inguinal lymph nodes noted.   Electronically Signed   By: Lavonia Dana M.D.   On: 06/28/2013 13:52   Ct Abdomen Pelvis W Contrast  06/28/2013   CLINICAL DATA:  Pain and swelling left scrotum  EXAM: CT ABDOMEN AND PELVIS WITH CONTRAST  TECHNIQUE: Multidetector CT imaging of the abdomen and pelvis was performed using the standard protocol following bolus administration of intravenous contrast.  CONTRAST:  135mL OMNIPAQUE IOHEXOL 300 MG/ML  SOLN  COMPARISON:  Ultrasound of the scrotum same day and CT of the chest 12/09/2012  FINDINGS: Lung bases are unremarkable. Sagittal images of the spine shows mild disc space flattening at L5-S1 level. There is disc bulge at L5-S1 level.  Liver shows no focal mass.  Mild hepatic fatty infiltration.  The pancreas, spleen and left adrenal is unremarkable. A right adrenal nodule measures 2 cm stable in size in appearance from prior exam. A right adrenal nodule measures 2 cm stable in size in appearance from prior exam. No calcified gallstones are noted within gallbladder.  No small bowel obstruction. No aortic aneurysm. No ascites or free air. Normal appendix. No pericecal inflammation. The terminal ileum is unremarkable. The urinary bladder is unremarkable. Prostate gland and seminal vesicles are unremarkable. Kidneys are symmetrical in size and enhancement. No hydronephrosis or hydroureter.  Delayed renal images shows bilateral renal symmetrical excretion. Bilateral visualized proximal ureter is unremarkable.  No pelvic ascites or adenopathy. A right inguinal lymph node measures 1.6 by 0.9 cm. A  left inguinal lymph node measures 1.7 x 1.4 cm. Second left inguinal lymph node measures 1.4 x 1 cm. A third left inguinal lymph node measures 1.7 by 1.2 cm. These are probable reactive. No evidence of inguinal hernia or inguinal mass.  IMPRESSION: 1. Mild hepatic fatty infiltration.  No focal hepatic mass. 2. Stable nodular lesion right adrenal gland probable benign in nature. 3. Normal appendix.  No pericecal inflammation. 4. There are mild enlarged left inguinal lymph nodes probable reactive in nature. 5. Unremarkable urinary bladder. No hydronephrosis or hydroureter. Bilateral renal symmetrical excretion.  Electronically Signed   By: Lahoma Crocker M.D.   On: 06/28/2013 17:55   [place] Art/ven Flow Abd Pelv Doppler  06/28/2013   CLINICAL DATA:  Pain and swelling in scrotum on LEFT, began 3 days ago, progressively more swollen and painful  EXAM: SCROTAL ULTRASOUND  DOPPLER ULTRASOUND OF THE TESTICLES  TECHNIQUE: Complete ultrasound examination of the testicles, epididymis, and other scrotal structures was performed. Color and spectral Doppler ultrasound were also utilized to evaluate blood flow to the testicles.  COMPARISON:  None.  None  FINDINGS: Right testicle  Measurements: 3.7 x 2.1 x 3.1 cm. Normal morphology without mass or calcification. Internal blood flow present on color Doppler imaging.  Left testicle  Measurements: 3.0 x 2.1 x 2.8 cm. Normal morphology without mass or calcification. Internal blood flow present on color Doppler imaging.  Right epididymis:  Normal in size and appearance.  Left epididymis:  Normal in size and appearance.  Hydrocele:  Small RIGHT hydrocele.  Varicocele:  Absent bilaterally  Pulsed Doppler interrogation of both testes demonstrates low resistance arterial and venous waveforms bilaterally.  Incidentally noted are multiple normal-sized lymph nodes at LEFT inguinal canal at general region of pain. In addition, marked wall thickening of the wall of the upper LEFT scrotum is  identified, up to 1.7 cm 6 over an area of 7.4 cm greatest diameter suggesting focal inflammatory process.  IMPRESSION: Unremarkable testes.  Small RIGHT hydrocele.  No evidence of testicular torsion or mass.  Significant wall thickening of the upper LEFT scrotum suggesting an inflammatory process/cellulitis with few likely reactive LEFT inguinal lymph nodes noted.   Electronically Signed   By: Lavonia Dana M.D.   On: 06/28/2013 13:52    Shanker Kristeen Mans, MD  [facility] Pager:[phone]  If 7PM-7AM, please contact night-coverage [email or web address] Password [email or web address] 06/29/2013, 10:03 AM   LOS: 1 day   **Disclaimer: This note may have been dictated with voice recognition software. Similar sounding words can inadvertently be transcribed and this note may contain transcription errors which may not have been corrected upon publication of note.**

## 2013-06-29 NOTE — Progress Notes (Signed)
Pt admitted to unit from ED. Pt A&O & VS stable. Pt has blister to right inner thigh & L scrotal cellulitis. Pt oriented to unit & call bell within reach. Pt currently resting comfortably in bed with wife at bedside. Will continue to monitor.

## 2013-06-29 NOTE — ED Provider Notes (Signed)
Medical screening examination/treatment/procedure(s) were performed by resident physician or non-physician practitioner and as supervising physician I was immediately available for consultation/collaboration.   Pauline Good MD.   Billy Fischer, MD 06/29/13 1126

## 2013-06-29 NOTE — Progress Notes (Signed)
Marked the area with marking pen Hot moist compresses will be ordered

## 2013-06-29 NOTE — Consult Note (Signed)
Urology Consult  Referring physician: Ghimire s; Laverda Sorenson Reason for referral: scrotal infection  Chief Complaint: Scrotal infection  History of Present Illness: 48 year old male admitted with scrotal infection; asked to assess today; on vancomycin and zosyn; not felt to be surgical but wanted second opinion; on metformin; WBC 15.3; now 14.4; afebrile; elevated glucose and normal serum Cr; CT pelvis demonstrated findings typical of cellulits and extends towards inguinal area; u/sound of scrotum demonstrated similar nodes as CT scan and thickening of scrotal wall- testes and epididymis normal;  Pain and know started three days ago and enlarged  No past Hx of infections/MRSA/GU surgery/UTI/stones and minimal LUTS Modifying factors: There are no other modifying factors  Associated signs and symptoms: There are no other associated signs and symptoms Aggravating and relieving factors: There are no other aggravating or relieving factors Severity: Moderate Duration: Persistent  Past Medical History  Diagnosis Date  . Diabetes mellitus   . Sleep apnea    Past Surgical History  Procedure Laterality Date  . Tonsillectomy      Medications: I have reviewed the patient's current medications. Allergies: No Known Allergies  Family History  Problem Relation Age of Onset  . Hypertension Mother   . Hypertension Father   . Diabetes Father    Social History:  reports that he has been smoking Cigars.  He does not have any smokeless tobacco history on file. He reports that he drinks about 1.8 ounces of alcohol per week. He reports that he uses illicit drugs (Marijuana and Cocaine).  ROS: All systems are reviewed and negative except as noted. Rest negative  Physical Exam:  Vital signs in last 24 hours: Temp:  [98.3 F (36.8 C)-99.1 F (37.3 C)] 98.3 F (36.8 C) (05/23 0806) Pulse Rate:  [93-122] 109 (05/23 0806) Resp:  [14-18] 16 (05/23 0806) BP: (104-138)/(59-84) 123/79 mmHg (05/23  1052) SpO2:  [82 %-99 %] 97 % (05/23 0806) Weight:  [96.4 kg (212 lb 8.4 oz)] 96.4 kg (212 lb 8.4 oz) (05/23 0300)  Cardiovascular: Skin warm; not flushed Respiratory: Breaths quiet; no shortness of breath Abdomen: No masses Neurological: Normal sensation to touch Musculoskeletal: Normal motor function arms and legs Lymphatics: No inguinal adenopathy Skin: No rashes Genitourinary:approximately a 6 x 2 cm warm somewhat firm swelling in high left hemiscrotum and near external ring on left; testes x 2 soft and palpable and mobile; no visual cellulitis and no perineal involvement; no crepitus; non-toxix  Laboratory Data:  Results for orders placed during the hospital encounter of 06/28/13 (from the past 72 hour(s))  CBC WITH DIFFERENTIAL     Status: Abnormal   Collection Time    06/28/13 12:36 PM      Result Value Ref Range   WBC 15.3 (*) 4.0 - 10.5 K/uL   RBC 4.83  4.22 - 5.81 MIL/uL   Hemoglobin 14.9  13.0 - 17.0 g/dL   HCT 43.5  39.0 - 52.0 %   MCV 90.1  78.0 - 100.0 fL   MCH 30.8  26.0 - 34.0 pg   MCHC 34.3  30.0 - 36.0 g/dL   RDW 12.8  11.5 - 15.5 %   Platelets 324  150 - 400 K/uL   Neutrophils Relative % 83 (*) 43 - 77 %   Neutro Abs 12.7 (*) 1.7 - 7.7 K/uL   Lymphocytes Relative 11 (*) 12 - 46 %   Lymphs Abs 1.6  0.7 - 4.0 K/uL   Monocytes Relative 6  3 - 12 %  Monocytes Absolute 1.0  0.1 - 1.0 K/uL   Eosinophils Relative 0  0 - 5 %   Eosinophils Absolute 0.0  0.0 - 0.7 K/uL   Basophils Relative 0  0 - 1 %   Basophils Absolute 0.0  0.0 - 0.1 K/uL  BASIC METABOLIC PANEL     Status: Abnormal   Collection Time    06/28/13 12:36 PM      Result Value Ref Range   Sodium 129 (*) 137 - 147 mEq/L   Potassium 4.5  3.7 - 5.3 mEq/L   Chloride 91 (*) 96 - 112 mEq/L   CO2 25  19 - 32 mEq/L   Glucose, Bld 314 (*) 70 - 99 mg/dL   BUN 15  6 - 23 mg/dL   Creatinine, Ser 1.07  0.50 - 1.35 mg/dL   Calcium 9.6  8.4 - 10.5 mg/dL   GFR calc non Af Amer 81 (*) >90 mL/min   GFR calc Af  Amer >90  >90 mL/min   Comment: (NOTE)     The eGFR has been calculated using the CKD EPI equation.     This calculation has not been validated in all clinical situations.     eGFR's persistently <90 mL/min signify possible Chronic Kidney     Disease.  CBG MONITORING, ED     Status: Abnormal   Collection Time    06/28/13 12:43 PM      Result Value Ref Range   Glucose-Capillary 302 (*) 70 - 99 mg/dL  URINALYSIS, ROUTINE W REFLEX MICROSCOPIC     Status: Abnormal   Collection Time    06/28/13  1:55 PM      Result Value Ref Range   Color, Urine YELLOW  YELLOW   APPearance CLEAR  CLEAR   Specific Gravity, Urine 1.042 (*) 1.005 - 1.030   pH 6.5  5.0 - 8.0   Glucose, UA >1000 (*) NEGATIVE mg/dL   Hgb urine dipstick NEGATIVE  NEGATIVE   Bilirubin Urine NEGATIVE  NEGATIVE   Ketones, ur 15 (*) NEGATIVE mg/dL   Protein, ur 30 (*) NEGATIVE mg/dL   Urobilinogen, UA 0.2  0.0 - 1.0 mg/dL   Nitrite NEGATIVE  NEGATIVE   Leukocytes, UA NEGATIVE  NEGATIVE  URINE MICROSCOPIC-ADD ON     Status: None   Collection Time    06/28/13  1:55 PM      Result Value Ref Range   Squamous Epithelial / LPF RARE  RARE   WBC, UA 0-2  <3 WBC/hpf   RBC / HPF 0-2  <3 RBC/hpf   Bacteria, UA RARE  RARE  CBG MONITORING, ED     Status: Abnormal   Collection Time    06/28/13  2:49 PM      Result Value Ref Range   Glucose-Capillary 217 (*) 70 - 99 mg/dL  GLUCOSE, CAPILLARY     Status: Abnormal   Collection Time    06/28/13 11:22 PM      Result Value Ref Range   Glucose-Capillary 288 (*) 70 - 99 mg/dL   Comment 1 Documented in Chart     Comment 2 Notify RN    BASIC METABOLIC PANEL     Status: Abnormal   Collection Time    06/29/13  4:20 AM      Result Value Ref Range   Sodium 135 (*) 137 - 147 mEq/L   Potassium 4.2  3.7 - 5.3 mEq/L   Chloride 96  96 - 112 mEq/L  CO2 27  19 - 32 mEq/L   Glucose, Bld 222 (*) 70 - 99 mg/dL   BUN 14  6 - 23 mg/dL   Creatinine, Ser 1.22  0.50 - 1.35 mg/dL   Calcium 8.6  8.4 -  10.5 mg/dL   GFR calc non Af Amer 69 (*) >90 mL/min   GFR calc Af Amer 80 (*) >90 mL/min   Comment: (NOTE)     The eGFR has been calculated using the CKD EPI equation.     This calculation has not been validated in all clinical situations.     eGFR's persistently <90 mL/min signify possible Chronic Kidney     Disease.  CBC     Status: Abnormal   Collection Time    06/29/13  4:20 AM      Result Value Ref Range   WBC 14.4 (*) 4.0 - 10.5 K/uL   RBC 4.34  4.22 - 5.81 MIL/uL   Hemoglobin 13.1  13.0 - 17.0 g/dL   HCT 40.2  39.0 - 52.0 %   MCV 92.6  78.0 - 100.0 fL   MCH 30.2  26.0 - 34.0 pg   MCHC 32.6  30.0 - 36.0 g/dL   RDW 13.0  11.5 - 15.5 %   Platelets 320  150 - 400 K/uL  GLUCOSE, CAPILLARY     Status: Abnormal   Collection Time    06/29/13  4:40 AM      Result Value Ref Range   Glucose-Capillary 219 (*) 70 - 99 mg/dL   Comment 1 Documented in Chart     Comment 2 Notify RN    GLUCOSE, CAPILLARY     Status: Abnormal   Collection Time    06/29/13  8:01 AM      Result Value Ref Range   Glucose-Capillary 214 (*) 70 - 99 mg/dL  GLUCOSE, CAPILLARY     Status: Abnormal   Collection Time    06/29/13 12:22 PM      Result Value Ref Range   Glucose-Capillary 117 (*) 70 - 99 mg/dL   No results found for this or any previous visit (from the past 240 hour(s)). Creatinine:  Recent Labs  06/28/13 1236 06/29/13 0420  CREATININE 1.07 1.22    Xrays: See report/chart As aboe  Impression/Assessment:  Left scrotal cellulitis tx with iv antibiotics i agree with medical therapy and close observation Chance of need for surgical drainage discussed   Plan:  Will mark area and follow  [place] 06/29/2013, 12:32 PM

## 2013-06-30 DIAGNOSIS — J189 Pneumonia, unspecified organism: Secondary | ICD-10-CM

## 2013-06-30 LAB — GLUCOSE, CAPILLARY
GLUCOSE-CAPILLARY: 139 mg/dL — AB (ref 70–99)
GLUCOSE-CAPILLARY: 172 mg/dL — AB (ref 70–99)
GLUCOSE-CAPILLARY: 187 mg/dL — AB (ref 70–99)
GLUCOSE-CAPILLARY: 201 mg/dL — AB (ref 70–99)
Glucose-Capillary: 127 mg/dL — ABNORMAL HIGH (ref 70–99)
Glucose-Capillary: 199 mg/dL — ABNORMAL HIGH (ref 70–99)
Glucose-Capillary: 105 mg/dL — ABNORMAL HIGH (ref 70–99)

## 2013-06-30 MED ORDER — INSULIN DETEMIR 100 UNIT/ML ~~LOC~~ SOLN
22.0000 [IU] | Freq: Two times a day (BID) | SUBCUTANEOUS | Status: DC
  Administered 2013-06-30 – 2013-07-01 (×2): 22 [IU] via SUBCUTANEOUS
  Filled 2013-06-30 (×3): qty 0.22

## 2013-06-30 MED ORDER — INSULIN ASPART 100 UNIT/ML ~~LOC~~ SOLN
6.0000 [IU] | Freq: Three times a day (TID) | SUBCUTANEOUS | Status: DC
  Administered 2013-06-30 – 2013-07-01 (×4): 6 [IU] via SUBCUTANEOUS

## 2013-06-30 NOTE — Progress Notes (Signed)
PATIENT DETAILS Name: Jonathan Austin Age: 48 y.o. Sex: male Date of Birth: 05-03-1965 Admit Date: 06/28/2013 Admitting Physician Reece Packer, MD PCP:Pcp Not In System  Subjective: Significant pain and swelling in the scrotal area. Minimal drainage, per urology I&D to be done today with local infiltration.  Assessment/Plan: Active Problems:   Left Scrotal Cellulitis -admitted and started on empiric Vanco/Zosyn-currently day 2.  -Urology consulted, no signs of needing surgery at this time -CT Pelvis confirms cellulitis, no evidence of compartment or gangrenous infection, Ultrasound neg for torsion. -Continue with antibiotics and follow clinical course  SIR's -secondary to above -treat with IVF and Abx  Hyponatremia -likely secondary to Dehydration -resolved with IVF    Diabetes mellitus, type 2 -c/w Levemir and SSI-follow CBG's and adjust accordingly. Metformin on hold-resume on discharge  HTN -controlled with Lisinopril   Disposition: Remain inpatient  DVT Prophylaxis: Prophylactic Heparin   Code Status: Full code   Family Communication Girlfriend at bedside  Procedures:  None  CONSULTS:  urology  Time spent 40 minutes-which includes 50% of the time with face-to-face with patient/ family and coordinating care related to the above assessment and plan.    MEDICATIONS: Scheduled Meds: . heparin subcutaneous  5,000 Units Subcutaneous 3 times per day  . insulin aspart  0-9 Units Subcutaneous 6 times per day  . insulin detemir  20 Units Subcutaneous BID  . lisinopril  10 mg Oral Daily  . piperacillin-tazobactam (ZOSYN)  IV  3.375 g Intravenous 3 times per day  . vancomycin  1,250 mg Intravenous Q12H   Continuous Infusions: . sodium chloride 100 mL/hr at 06/30/13 0143   PRN Meds:.acetaminophen, acetaminophen, alum & mag hydroxide-simeth, HYDROmorphone (DILAUDID) injection, LORazepam, ondansetron (ZOFRAN) IV, ondansetron,  oxyCODONE  Antibiotics: Anti-infectives   Start     Dose/Rate Route Frequency Ordered Stop   06/29/13 0600  vancomycin (VANCOCIN) 1,250 mg in sodium chloride 0.9 % 250 mL IVPB     1,250 mg 166.7 mL/hr over 90 Minutes Intravenous Every 12 hours 06/28/13 2243     06/28/13 2230  piperacillin-tazobactam (ZOSYN) IVPB 3.375 g     3.375 g 12.5 mL/hr over 240 Minutes Intravenous 3 times per day 06/28/13 2229     06/28/13 1515  piperacillin-tazobactam (ZOSYN) IVPB 3.375 g     3.375 g 12.5 mL/hr over 240 Minutes Intravenous  Once 06/28/13 1506 06/28/13 1604   06/28/13 1515  vancomycin (VANCOCIN) IVPB 1000 mg/200 mL premix     1,000 mg 200 mL/hr over 60 Minutes Intravenous  Once 06/28/13 1506 06/28/13 1714       PHYSICAL EXAM: Vital signs in last 24 hours: Filed Vitals:   06/30/13 0018 06/30/13 0429 06/30/13 0935 06/30/13 1003  BP:  131/84 142/88 119/76  Pulse: 104 97  104  Temp:  98.1 F (36.7 C)  97.4 F (36.3 C)  TempSrc:  Oral  Oral  Resp: 18 18  18   Height:      Weight:      SpO2:  97%  97%    Weight change:  Filed Weights   06/28/13 1226 06/28/13 2308 06/29/13 0300  Weight: 90.719 kg (200 lb) 96.4 kg (212 lb 8.4 oz) 96.4 kg (212 lb 8.4 oz)   Body mass index is 33.28 kg/(m^2).   Gen Exam: Awake and alert with clear speech.   Neck: Supple, No JVD.   Chest: B/L Clear.   CVS: S1 S2 Regular, no murmurs.  Abdomen: soft, BS +,  non tender, non distended. Left scrotal area indurated-mild tenderness in the groin. Extremities: no edema, lower extremities warm to touch. Neurologic: Non Focal.   Skin: No Rash.   Wounds: N/A.    Intake/Output from previous day: No intake or output data in the 24 hours ending 06/30/13 1102   LAB RESULTS: CBC  Recent Labs Lab 06/28/13 1236 06/29/13 0420  WBC 15.3* 14.4*  HGB 14.9 13.1  HCT 43.5 40.2  PLT 324 320  MCV 90.1 92.6  MCH 30.8 30.2  MCHC 34.3 32.6  RDW 12.8 13.0  LYMPHSABS 1.6  --   MONOABS 1.0  --   EOSABS 0.0  --    BASOSABS 0.0  --     Chemistries   Recent Labs Lab 06/28/13 1236 06/29/13 0420  NA 129* 135*  K 4.5 4.2  CL 91* 96  CO2 25 27  GLUCOSE 314* 222*  BUN 15 14  CREATININE 1.07 1.22  CALCIUM 9.6 8.6    CBG:  Recent Labs Lab 06/29/13 1936 06/29/13 2155 06/30/13 0016 06/30/13 0426 06/30/13 0837  GLUCAP 268* 161* 172* 127* 199*    GFR Estimated Creatinine Clearance: 82.8 ml/min (by C-G formula based on Cr of 1.22).  Coagulation profile No results found for this basename: INR, PROTIME,  in the last 168 hours  Cardiac Enzymes No results found for this basename: CK, CKMB, TROPONINI, MYOGLOBIN,  in the last 168 hours  No components found with this basename: POCBNP,  No results found for this basename: DDIMER,  in the last 72 hours  Recent Labs  06/29/13 0420  HGBA1C 10.6*   No results found for this basename: CHOL, HDL, LDLCALC, TRIG, CHOLHDL, LDLDIRECT,  in the last 72 hours No results found for this basename: TSH, T4TOTAL, FREET3, T3FREE, THYROIDAB,  in the last 72 hours No results found for this basename: VITAMINB12, FOLATE, FERRITIN, TIBC, IRON, RETICCTPCT,  in the last 72 hours No results found for this basename: LIPASE, AMYLASE,  in the last 72 hours  Urine Studies No results found for this basename: UACOL, UAPR, USPG, UPH, UTP, UGL, UKET, UBIL, UHGB, UNIT, UROB, ULEU, UEPI, UWBC, URBC, UBAC, CAST, CRYS, UCOM, BILUA,  in the last 72 hours  MICROBIOLOGY: No results found for this or any previous visit (from the past 240 hour(s)).  RADIOLOGY STUDIES/RESULTS: Ct Pelvis W Contrast  06/28/2013   CLINICAL DATA:  Pain and swelling in the left side of the scrotum.  EXAM: CT PELVIS AND UPPER THIGHS INCLUDING THE SCROTUM WITH CONTRAST  TECHNIQUE: Multidetector CT imaging of the pelvis and upper thighs including the scrotum was performed using the standard protocol following the bolus administration of intravenous contrast.  CONTRAST:  31mL OMNIPAQUE IOHEXOL 300 MG/ML   SOLN  COMPARISON:  CT SCAN OF THE PELVIS DATED 06/28/2013  FINDINGS: There is marked thickening of the wall of the scrotum. There is no inguinal hernia. Testicles are not enlarged. There is soft tissue stranding in the subcutaneous fat extending asymmetric to the left. There is no inguinal hernia or mass. There is reactive adenopathy in both inguinal regions. Penis appears normal. The soft tissue stranding, consistent with cellulitis, extends toward the left inguinal region.  The structures in the pelvis appear normal including the bladder. No osseous abnormality.  IMPRESSION: Cellulitis of the scrotum extending toward the left inguinal region. The patient is at risk for developing Fournier's gangrene. However, at this time there is no involvement of the perineum.   Electronically Signed   By: Clair Gulling  Maxwell M.D.   On: 06/28/2013 19:55   US Scrotum  06/28/2013   CLINICAL DATA:  Pain and swelling in scrotum on LEFT, began 3 days ago, progressively more swollen and painful  EXAM: SCROTAL ULTRASOUND  DOPPLER ULTRASOUND OF THE TESTICLES  TECHNIQUE: Complete ultrasound examination of the testicles, epididymis, and other scrotal structures was performed. Color and spectral Doppler ultrasound were also utilized to evaluate blood flow to the testicles.  COMPARISON:  None.  None  FINDINGS: Right testicle  Measurements: 3.7 x 2.1 x 3.1 cm. Normal morphology without mass or calcification. Internal blood flow present on color Doppler imaging.  Left testicle  Measurements: 3.0 x 2.1 x 2.8 cm. Normal morphology without mass or calcification. Internal blood flow present on color Doppler imaging.  Right epididymis:  Normal in size and appearance.  Left epididymis:  Normal in size and appearance.  Hydrocele:  Small RIGHT hydrocele.  Varicocele:  Absent bilaterally  Pulsed Doppler interrogation of both testes demonstrates low resistance arterial and venous waveforms bilaterally.  Incidentally noted are multiple normal-sized lymph  nodes at LEFT inguinal canal at general region of pain. In addition, marked wall thickening of the wall of the upper LEFT scrotum is identified, up to 1.7 cm 6 over an area of 7.4 cm greatest diameter suggesting focal inflammatory process.  IMPRESSION: Unremarkable testes.  Small RIGHT hydrocele.  No evidence of testicular torsion or mass.  Significant wall thickening of the upper LEFT scrotum suggesting an inflammatory process/cellulitis with few likely reactive LEFT inguinal lymph nodes noted.   Electronically Signed   By: Lavonia Dana M.D.   On: 06/28/2013 13:52   Ct Abdomen Pelvis W Contrast  06/28/2013   CLINICAL DATA:  Pain and swelling left scrotum  EXAM: CT ABDOMEN AND PELVIS WITH CONTRAST  TECHNIQUE: Multidetector CT imaging of the abdomen and pelvis was performed using the standard protocol following bolus administration of intravenous contrast.  CONTRAST:  121mL OMNIPAQUE IOHEXOL 300 MG/ML  SOLN  COMPARISON:  Ultrasound of the scrotum same day and CT of the chest 12/09/2012  FINDINGS: Lung bases are unremarkable. Sagittal images of the spine shows mild disc space flattening at L5-S1 level. There is disc bulge at L5-S1 level.  Liver shows no focal mass.  Mild hepatic fatty infiltration.  The pancreas, spleen and left adrenal is unremarkable. A right adrenal nodule measures 2 cm stable in size in appearance from prior exam. A right adrenal nodule measures 2 cm stable in size in appearance from prior exam. No calcified gallstones are noted within gallbladder.  No small bowel obstruction. No aortic aneurysm. No ascites or free air. Normal appendix. No pericecal inflammation. The terminal ileum is unremarkable. The urinary bladder is unremarkable. Prostate gland and seminal vesicles are unremarkable. Kidneys are symmetrical in size and enhancement. No hydronephrosis or hydroureter.  Delayed renal images shows bilateral renal symmetrical excretion. Bilateral visualized proximal ureter is unremarkable.  No  pelvic ascites or adenopathy. A right inguinal lymph node measures 1.6 by 0.9 cm. A left inguinal lymph node measures 1.7 x 1.4 cm. Second left inguinal lymph node measures 1.4 x 1 cm. A third left inguinal lymph node measures 1.7 by 1.2 cm. These are probable reactive. No evidence of inguinal hernia or inguinal mass.  IMPRESSION: 1. Mild hepatic fatty infiltration.  No focal hepatic mass. 2. Stable nodular lesion right adrenal gland probable benign in nature. 3. Normal appendix.  No pericecal inflammation. 4. There are mild enlarged left inguinal lymph nodes probable reactive in nature.  5. Unremarkable urinary bladder. No hydronephrosis or hydroureter. Bilateral renal symmetrical excretion.   Electronically Signed   By: Lahoma Crocker M.D.   On: 06/28/2013 17:55   [place] Art/ven Flow Abd Pelv Doppler  06/28/2013   CLINICAL DATA:  Pain and swelling in scrotum on LEFT, began 3 days ago, progressively more swollen and painful  EXAM: SCROTAL ULTRASOUND  DOPPLER ULTRASOUND OF THE TESTICLES  TECHNIQUE: Complete ultrasound examination of the testicles, epididymis, and other scrotal structures was performed. Color and spectral Doppler ultrasound were also utilized to evaluate blood flow to the testicles.  COMPARISON:  None.  None  FINDINGS: Right testicle  Measurements: 3.7 x 2.1 x 3.1 cm. Normal morphology without mass or calcification. Internal blood flow present on color Doppler imaging.  Left testicle  Measurements: 3.0 x 2.1 x 2.8 cm. Normal morphology without mass or calcification. Internal blood flow present on color Doppler imaging.  Right epididymis:  Normal in size and appearance.  Left epididymis:  Normal in size and appearance.  Hydrocele:  Small RIGHT hydrocele.  Varicocele:  Absent bilaterally  Pulsed Doppler interrogation of both testes demonstrates low resistance arterial and venous waveforms bilaterally.  Incidentally noted are multiple normal-sized lymph nodes at LEFT inguinal canal at general region of pain.  In addition, marked wall thickening of the wall of the upper LEFT scrotum is identified, up to 1.7 cm 6 over an area of 7.4 cm greatest diameter suggesting focal inflammatory process.  IMPRESSION: Unremarkable testes.  Small RIGHT hydrocele.  No evidence of testicular torsion or mass.  Significant wall thickening of the upper LEFT scrotum suggesting an inflammatory process/cellulitis with few likely reactive LEFT inguinal lymph nodes noted.   Electronically Signed   By: Lavonia Dana M.D.   On: 06/28/2013 13:52    Verlee Monte, MD  [facility] Pager:[phone]  If 7PM-7AM, please contact night-coverage [email or web address] Password [email or web address] 06/30/2013, 11:02 AM   LOS: 2 days   **Disclaimer: This note may have been dictated with voice recognition software. Similar sounding words can inadvertently be transcribed and this note may contain transcription errors which may not have been corrected upon publication of note.**

## 2013-06-30 NOTE — Op Note (Signed)
Jonathan Austin is a 48 y.o.   06/30/2013  Pre-op diagnosis: Scrotal abscess left side  Postop diagnosis: Same  Procedure done: I and D. scrotal abscess, culture abscess.  Surgeon: Charlene Brooke. Yasmyn Bellisario  Anesthesia: Local  Indication: Patient is a 48 years old male diabetic who was admitted yesterday for swelling left scrotum. He was seen by Dr. Matilde Sprang who did not notice any fluctuance. Patient is on IV Zosyn and vancomycin. When I saw him this morning he had he had started to drain some purulent material. There is a fluctuant area over the left upper scrotum. I explained to him that he needs and drainage of the abscess. And he agrees to have it done under local anesthesia.  Procedure: Patient was identified by his wrist band.  The scrotum was infiltrated with 2% lidocaine over the fluctuant area. A 3 cm longitudinal incision was made over the fluctuant area. About 10 cc of purulent material were drained out of the abscess cavity. Wound culture was done. The wound was then packed with iodoform gauze.  The patient tolerated the procedure well  EBL: Minimal  Needles, sponges count: Correct

## 2013-06-30 NOTE — Progress Notes (Signed)
Placed patient on CPAP for the night via auto-mode with minimum pressure set at 5cm and maximum pressure set at 20cm. Will continue to monitor patient 

## 2013-06-30 NOTE — Progress Notes (Signed)
Subjective: Patient reports: Pain and swelling left scrotum  Objective: Vital signs in last 24 hours: Temp:  [97.4 F (36.3 C)-98.3 F (36.8 C)] 97.4 F (36.3 C) (05/24 1003) Pulse Rate:  [97-119] 104 (05/24 1003) Resp:  [14-18] 18 (05/24 1003) BP: (106-142)/(70-88) 119/76 mmHg (05/24 1003) SpO2:  [90 %-97 %] 97 % (05/24 1003)  Intake/Output from previous day:   Intake/Output this shift:    Physical Exam:  General: Left scrotum fluctuant, tender.  Has started to drain some purulent material.  Lab Results:  Recent Labs  06/28/13 1236 06/29/13 0420  HGB 14.9 13.1  HCT 43.5 40.2   BMET  Recent Labs  06/28/13 1236 06/29/13 0420  NA 129* 135*  K 4.5 4.2  CL 91* 96  CO2 25 27  GLUCOSE 314* 222*  BUN 15 14  CREATININE 1.07 1.22  CALCIUM 9.6 8.6   No results found for this basename: LABPT, INR,  in the last 72 hours No results found for this basename: LABURIN,  in the last 72 hours No results found for this or any previous visit.  Studies/Results: Ct Pelvis W Contrast  06/28/2013   CLINICAL DATA:  Pain and swelling in the left side of the scrotum.  EXAM: CT PELVIS AND UPPER THIGHS INCLUDING THE SCROTUM WITH CONTRAST  TECHNIQUE: Multidetector CT imaging of the pelvis and upper thighs including the scrotum was performed using the standard protocol following the bolus administration of intravenous contrast.  CONTRAST:  87mL OMNIPAQUE IOHEXOL 300 MG/ML  SOLN  COMPARISON:  CT SCAN OF THE PELVIS DATED 06/28/2013  FINDINGS: There is marked thickening of the wall of the scrotum. There is no inguinal hernia. Testicles are not enlarged. There is soft tissue stranding in the subcutaneous fat extending asymmetric to the left. There is no inguinal hernia or mass. There is reactive adenopathy in both inguinal regions. Penis appears normal. The soft tissue stranding, consistent with cellulitis, extends toward the left inguinal region.  The structures in the pelvis appear normal  including the bladder. No osseous abnormality.  IMPRESSION: Cellulitis of the scrotum extending toward the left inguinal region. The patient is at risk for developing Fournier's gangrene. However, at this time there is no involvement of the perineum.   Electronically Signed   By: Rozetta Nunnery M.D.   On: 06/28/2013 19:55   US Scrotum  06/28/2013   CLINICAL DATA:  Pain and swelling in scrotum on LEFT, began 3 days ago, progressively more swollen and painful  EXAM: SCROTAL ULTRASOUND  DOPPLER ULTRASOUND OF THE TESTICLES  TECHNIQUE: Complete ultrasound examination of the testicles, epididymis, and other scrotal structures was performed. Color and spectral Doppler ultrasound were also utilized to evaluate blood flow to the testicles.  COMPARISON:  None.  None  FINDINGS: Right testicle  Measurements: 3.7 x 2.1 x 3.1 cm. Normal morphology without mass or calcification. Internal blood flow present on color Doppler imaging.  Left testicle  Measurements: 3.0 x 2.1 x 2.8 cm. Normal morphology without mass or calcification. Internal blood flow present on color Doppler imaging.  Right epididymis:  Normal in size and appearance.  Left epididymis:  Normal in size and appearance.  Hydrocele:  Small RIGHT hydrocele.  Varicocele:  Absent bilaterally  Pulsed Doppler interrogation of both testes demonstrates low resistance arterial and venous waveforms bilaterally.  Incidentally noted are multiple normal-sized lymph nodes at LEFT inguinal canal at general region of pain. In addition, marked wall thickening of the wall of the upper LEFT scrotum is identified, up  to 1.7 cm 6 over an area of 7.4 cm greatest diameter suggesting focal inflammatory process.  IMPRESSION: Unremarkable testes.  Small RIGHT hydrocele.  No evidence of testicular torsion or mass.  Significant wall thickening of the upper LEFT scrotum suggesting an inflammatory process/cellulitis with few likely reactive LEFT inguinal lymph nodes noted.   Electronically Signed    By: Lavonia Dana M.D.   On: 06/28/2013 13:52   Ct Abdomen Pelvis W Contrast  06/28/2013   CLINICAL DATA:  Pain and swelling left scrotum  EXAM: CT ABDOMEN AND PELVIS WITH CONTRAST  TECHNIQUE: Multidetector CT imaging of the abdomen and pelvis was performed using the standard protocol following bolus administration of intravenous contrast.  CONTRAST:  188mL OMNIPAQUE IOHEXOL 300 MG/ML  SOLN  COMPARISON:  Ultrasound of the scrotum same day and CT of the chest 12/09/2012  FINDINGS: Lung bases are unremarkable. Sagittal images of the spine shows mild disc space flattening at L5-S1 level. There is disc bulge at L5-S1 level.  Liver shows no focal mass.  Mild hepatic fatty infiltration.  The pancreas, spleen and left adrenal is unremarkable. A right adrenal nodule measures 2 cm stable in size in appearance from prior exam. A right adrenal nodule measures 2 cm stable in size in appearance from prior exam. No calcified gallstones are noted within gallbladder.  No small bowel obstruction. No aortic aneurysm. No ascites or free air. Normal appendix. No pericecal inflammation. The terminal ileum is unremarkable. The urinary bladder is unremarkable. Prostate gland and seminal vesicles are unremarkable. Kidneys are symmetrical in size and enhancement. No hydronephrosis or hydroureter.  Delayed renal images shows bilateral renal symmetrical excretion. Bilateral visualized proximal ureter is unremarkable.  No pelvic ascites or adenopathy. A right inguinal lymph node measures 1.6 by 0.9 cm. A left inguinal lymph node measures 1.7 x 1.4 cm. Second left inguinal lymph node measures 1.4 x 1 cm. A third left inguinal lymph node measures 1.7 by 1.2 cm. These are probable reactive. No evidence of inguinal hernia or inguinal mass.  IMPRESSION: 1. Mild hepatic fatty infiltration.  No focal hepatic mass. 2. Stable nodular lesion right adrenal gland probable benign in nature. 3. Normal appendix.  No pericecal inflammation. 4. There are mild  enlarged left inguinal lymph nodes probable reactive in nature. 5. Unremarkable urinary bladder. No hydronephrosis or hydroureter. Bilateral renal symmetrical excretion.   Electronically Signed   By: Lahoma Crocker M.D.   On: 06/28/2013 17:55   [place] Art/ven Flow Abd Pelv Doppler  06/28/2013   CLINICAL DATA:  Pain and swelling in scrotum on LEFT, began 3 days ago, progressively more swollen and painful  EXAM: SCROTAL ULTRASOUND  DOPPLER ULTRASOUND OF THE TESTICLES  TECHNIQUE: Complete ultrasound examination of the testicles, epididymis, and other scrotal structures was performed. Color and spectral Doppler ultrasound were also utilized to evaluate blood flow to the testicles.  COMPARISON:  None.  None  FINDINGS: Right testicle  Measurements: 3.7 x 2.1 x 3.1 cm. Normal morphology without mass or calcification. Internal blood flow present on color Doppler imaging.  Left testicle  Measurements: 3.0 x 2.1 x 2.8 cm. Normal morphology without mass or calcification. Internal blood flow present on color Doppler imaging.  Right epididymis:  Normal in size and appearance.  Left epididymis:  Normal in size and appearance.  Hydrocele:  Small RIGHT hydrocele.  Varicocele:  Absent bilaterally  Pulsed Doppler interrogation of both testes demonstrates low resistance arterial and venous waveforms bilaterally.  Incidentally noted are multiple normal-sized lymph nodes  at LEFT inguinal canal at general region of pain. In addition, marked wall thickening of the wall of the upper LEFT scrotum is identified, up to 1.7 cm 6 over an area of 7.4 cm greatest diameter suggesting focal inflammatory process.  IMPRESSION: Unremarkable testes.  Small RIGHT hydrocele.  No evidence of testicular torsion or mass.  Significant wall thickening of the upper LEFT scrotum suggesting an inflammatory process/cellulitis with few likely reactive LEFT inguinal lymph nodes noted.   Electronically Signed   By: Lavonia Dana M.D.   On: 06/28/2013 13:52     Assessment/Plan:  Scrotal abscess  Incision and drainage  Procedure, risks, benefits were explained to the patient.  The risks include but are not limited to hemorrhage, sepsis, recurrence of abscess.  He understands and agrees to have it done under local anesthesia   LOS: 2 days   Arvil Persons 06/30/2013, 10:33 AM

## 2013-07-01 LAB — BASIC METABOLIC PANEL
BUN: 12 mg/dL (ref 6–23)
CHLORIDE: 97 meq/L (ref 96–112)
CO2: 28 mEq/L (ref 19–32)
CREATININE: 1.36 mg/dL — AB (ref 0.50–1.35)
Calcium: 9 mg/dL (ref 8.4–10.5)
GFR, EST AFRICAN AMERICAN: 70 mL/min — AB (ref >90)
GFR, EST NON AFRICAN AMERICAN: 61 mL/min — AB (ref >90)
GLUCOSE: 146 mg/dL — AB (ref 70–99)
POTASSIUM: 3.9 meq/L (ref 3.7–5.3)
Sodium: 135 mEq/L — ABNORMAL LOW (ref 137–147)

## 2013-07-01 LAB — GLUCOSE, CAPILLARY
GLUCOSE-CAPILLARY: 157 mg/dL — AB (ref 70–99)
Glucose-Capillary: 119 mg/dL — ABNORMAL HIGH (ref 70–99)
Glucose-Capillary: 205 mg/dL — ABNORMAL HIGH (ref 70–99)

## 2013-07-01 LAB — CBC
HEMATOCRIT: 36.7 % — AB (ref 39.0–52.0)
HEMOGLOBIN: 12.4 g/dL — AB (ref 13.0–17.0)
MCH: 30.5 pg (ref 26.0–34.0)
MCHC: 33.8 g/dL (ref 30.0–36.0)
MCV: 90.4 fL (ref 78.0–100.0)
Platelets: 352 10*3/uL (ref 150–400)
RBC: 4.06 MIL/uL — ABNORMAL LOW (ref 4.22–5.81)
RDW: 12.6 % (ref 11.5–15.5)
WBC: 9.6 10*3/uL (ref 4.0–10.5)

## 2013-07-01 MED ORDER — OXYCODONE HCL 5 MG PO TABS: 5.0000 mg | ORAL_TABLET | Freq: Four times a day (QID) | ORAL | Status: DC | PRN

## 2013-07-01 MED ORDER — SODIUM CHLORIDE 0.9 % IV SOLN
INTRAVENOUS | Status: DC
  Administered 2013-07-01: 13:00:00 via INTRAVENOUS

## 2013-07-01 MED ORDER — DOXYCYCLINE HYCLATE 100 MG PO CAPS: 100.0000 mg | ORAL_CAPSULE | Freq: Two times a day (BID) | ORAL | Status: DC

## 2013-07-01 NOTE — Discharge Summary (Signed)
PATIENT DETAILS Name: Jonathan Austin Age: 48 y.o. Sex: male Date of Birth: 11/22/1965 MRN: [id number]. Admit Date: 06/28/2013 Admitting Physician: Reece Packer, MD PCP:Pcp Not In System  Recommendations for Outpatient Follow-up:  Follow wound culture results -pending at the time of discharge.  PRIMARY DISCHARGE DIAGNOSIS:  Active Problems:   Cellulitis   Scrotal infection   Diabetes mellitus, type 2   Cocaine abuse   Marijuana abuse   Leukocytosis   Hyponatremia      PAST MEDICAL HISTORY: Past Medical History  Diagnosis Date  . Diabetes mellitus   . Sleep apnea     DISCHARGE MEDICATIONS:   Medication List         acetaminophen 500 MG tablet  Commonly known as:  TYLENOL  Take 1,000 mg by mouth daily as needed for mild pain.     doxycycline 100 MG capsule  Commonly known as:  VIBRAMYCIN  Take 1 capsule (100 mg total) by mouth 2 (two) times daily.     glipiZIDE 5 MG 24 hr tablet  Commonly known as:  GLUCOTROL XL  Take 5 mg by mouth daily with breakfast.     insulin aspart 100 UNIT/ML injection  Commonly known as:  novoLOG  Inject 4-12 Units into the skin 2 (two) times daily before a meal. Sliding scale     insulin detemir 100 UNIT/ML injection  Commonly known as:  LEVEMIR  Inject 20 Units into the skin 2 (two) times daily.     lisinopril 10 MG tablet  Commonly known as:  PRINIVIL,ZESTRIL  Take 10 mg by mouth daily.     metFORMIN 500 MG tablet  Commonly known as:  GLUCOPHAGE  Take 500 mg by mouth 2 (two) times daily with a meal.     MULTIVITAMIN PO  Take 1 tablet by mouth daily.     oxyCODONE 5 MG immediate release tablet  Commonly known as:  Oxy IR/ROXICODONE  Take 1 tablet (5 mg total) by mouth every 6 (six) hours as needed for moderate pain.        ALLERGIES:  No Known Allergies  BRIEF HPI:  See H&P, Labs, Consult and Test reports for all details in brief,Jonathan Austin is a 48 y.o. male with a history of DM2 who presents to the ED  with complaints of increased pain and swelling of his scrotum worsening 3 days prior to his admit date. CONSULTATIONS:   urology  PERTINENT RADIOLOGIC STUDIES: Ct Pelvis W Contrast  06/28/2013   CLINICAL DATA:  Pain and swelling in the left side of the scrotum.  EXAM: CT PELVIS AND UPPER THIGHS INCLUDING THE SCROTUM WITH CONTRAST  TECHNIQUE: Multidetector CT imaging of the pelvis and upper thighs including the scrotum was performed using the standard protocol following the bolus administration of intravenous contrast.  CONTRAST:  34mL OMNIPAQUE IOHEXOL 300 MG/ML  SOLN  COMPARISON:  CT SCAN OF THE PELVIS DATED 06/28/2013  FINDINGS: There is marked thickening of the wall of the scrotum. There is no inguinal hernia. Testicles are not enlarged. There is soft tissue stranding in the subcutaneous fat extending asymmetric to the left. There is no inguinal hernia or mass. There is reactive adenopathy in both inguinal regions. Penis appears normal. The soft tissue stranding, consistent with cellulitis, extends toward the left inguinal region.  The structures in the pelvis appear normal including the bladder. No osseous abnormality.  IMPRESSION: Cellulitis of the scrotum extending toward the left inguinal region. The patient is at risk for developing Fournier's  gangrene. However, at this time there is no involvement of the perineum.   Electronically Signed   By: Rozetta Nunnery M.D.   On: 06/28/2013 19:55   US Scrotum  06/28/2013   CLINICAL DATA:  Pain and swelling in scrotum on LEFT, began 3 days ago, progressively more swollen and painful  EXAM: SCROTAL ULTRASOUND  DOPPLER ULTRASOUND OF THE TESTICLES  TECHNIQUE: Complete ultrasound examination of the testicles, epididymis, and other scrotal structures was performed. Color and spectral Doppler ultrasound were also utilized to evaluate blood flow to the testicles.  COMPARISON:  None.  None  FINDINGS: Right testicle  Measurements: 3.7 x 2.1 x 3.1 cm. Normal morphology  without mass or calcification. Internal blood flow present on color Doppler imaging.  Left testicle  Measurements: 3.0 x 2.1 x 2.8 cm. Normal morphology without mass or calcification. Internal blood flow present on color Doppler imaging.  Right epididymis:  Normal in size and appearance.  Left epididymis:  Normal in size and appearance.  Hydrocele:  Small RIGHT hydrocele.  Varicocele:  Absent bilaterally  Pulsed Doppler interrogation of both testes demonstrates low resistance arterial and venous waveforms bilaterally.  Incidentally noted are multiple normal-sized lymph nodes at LEFT inguinal canal at general region of pain. In addition, marked wall thickening of the wall of the upper LEFT scrotum is identified, up to 1.7 cm 6 over an area of 7.4 cm greatest diameter suggesting focal inflammatory process.  IMPRESSION: Unremarkable testes.  Small RIGHT hydrocele.  No evidence of testicular torsion or mass.  Significant wall thickening of the upper LEFT scrotum suggesting an inflammatory process/cellulitis with few likely reactive LEFT inguinal lymph nodes noted.   Electronically Signed   By: Lavonia Dana M.D.   On: 06/28/2013 13:52   Ct Abdomen Pelvis W Contrast  06/28/2013   CLINICAL DATA:  Pain and swelling left scrotum  EXAM: CT ABDOMEN AND PELVIS WITH CONTRAST  TECHNIQUE: Multidetector CT imaging of the abdomen and pelvis was performed using the standard protocol following bolus administration of intravenous contrast.  CONTRAST:  128mL OMNIPAQUE IOHEXOL 300 MG/ML  SOLN  COMPARISON:  Ultrasound of the scrotum same day and CT of the chest 12/09/2012  FINDINGS: Lung bases are unremarkable. Sagittal images of the spine shows mild disc space flattening at L5-S1 level. There is disc bulge at L5-S1 level.  Liver shows no focal mass.  Mild hepatic fatty infiltration.  The pancreas, spleen and left adrenal is unremarkable. A right adrenal nodule measures 2 cm stable in size in appearance from prior exam. A right adrenal  nodule measures 2 cm stable in size in appearance from prior exam. No calcified gallstones are noted within gallbladder.  No small bowel obstruction. No aortic aneurysm. No ascites or free air. Normal appendix. No pericecal inflammation. The terminal ileum is unremarkable. The urinary bladder is unremarkable. Prostate gland and seminal vesicles are unremarkable. Kidneys are symmetrical in size and enhancement. No hydronephrosis or hydroureter.  Delayed renal images shows bilateral renal symmetrical excretion. Bilateral visualized proximal ureter is unremarkable.  No pelvic ascites or adenopathy. A right inguinal lymph node measures 1.6 by 0.9 cm. A left inguinal lymph node measures 1.7 x 1.4 cm. Second left inguinal lymph node measures 1.4 x 1 cm. A third left inguinal lymph node measures 1.7 by 1.2 cm. These are probable reactive. No evidence of inguinal hernia or inguinal mass.  IMPRESSION: 1. Mild hepatic fatty infiltration.  No focal hepatic mass. 2. Stable nodular lesion right adrenal gland probable benign in  nature. 3. Normal appendix.  No pericecal inflammation. 4. There are mild enlarged left inguinal lymph nodes probable reactive in nature. 5. Unremarkable urinary bladder. No hydronephrosis or hydroureter. Bilateral renal symmetrical excretion.   Electronically Signed   By: Lahoma Crocker M.D.   On: 06/28/2013 17:55   [place] Art/ven Flow Abd Pelv Doppler  06/28/2013   CLINICAL DATA:  Pain and swelling in scrotum on LEFT, began 3 days ago, progressively more swollen and painful  EXAM: SCROTAL ULTRASOUND  DOPPLER ULTRASOUND OF THE TESTICLES  TECHNIQUE: Complete ultrasound examination of the testicles, epididymis, and other scrotal structures was performed. Color and spectral Doppler ultrasound were also utilized to evaluate blood flow to the testicles.  COMPARISON:  None.  None  FINDINGS: Right testicle  Measurements: 3.7 x 2.1 x 3.1 cm. Normal morphology without mass or calcification. Internal blood flow present  on color Doppler imaging.  Left testicle  Measurements: 3.0 x 2.1 x 2.8 cm. Normal morphology without mass or calcification. Internal blood flow present on color Doppler imaging.  Right epididymis:  Normal in size and appearance.  Left epididymis:  Normal in size and appearance.  Hydrocele:  Small RIGHT hydrocele.  Varicocele:  Absent bilaterally  Pulsed Doppler interrogation of both testes demonstrates low resistance arterial and venous waveforms bilaterally.  Incidentally noted are multiple normal-sized lymph nodes at LEFT inguinal canal at general region of pain. In addition, marked wall thickening of the wall of the upper LEFT scrotum is identified, up to 1.7 cm 6 over an area of 7.4 cm greatest diameter suggesting focal inflammatory process.  IMPRESSION: Unremarkable testes.  Small RIGHT hydrocele.  No evidence of testicular torsion or mass.  Significant wall thickening of the upper LEFT scrotum suggesting an inflammatory process/cellulitis with few likely reactive LEFT inguinal lymph nodes noted.   Electronically Signed   By: Lavonia Dana M.D.   On: 06/28/2013 13:52     PERTINENT LAB RESULTS: CBC:  Recent Labs  06/29/13 0420 07/01/13 0450  WBC 14.4* 9.6  HGB 13.1 12.4*  HCT 40.2 36.7*  PLT 320 352   CMET CMP     Component Value Date/Time   NA 135* 07/01/2013 0450   K 3.9 07/01/2013 0450   CL 97 07/01/2013 0450   CO2 28 07/01/2013 0450   GLUCOSE 146* 07/01/2013 0450   BUN 12 07/01/2013 0450   CREATININE 1.36* 07/01/2013 0450   CALCIUM 9.0 07/01/2013 0450   PROT 6.8 12/10/2012 0619   ALBUMIN 2.9* 12/10/2012 0619   AST 13 12/10/2012 0619   ALT 13 12/10/2012 0619   ALKPHOS 63 12/10/2012 0619   BILITOT 0.3 12/10/2012 0619   GFRNONAA 61* 07/01/2013 0450   GFRAA 70* 07/01/2013 0450    GFR Estimated Creatinine Clearance: 74.3 ml/min (by C-G formula based on Cr of 1.36). No results found for this basename: LIPASE, AMYLASE,  in the last 72 hours No results found for this basename: CKTOTAL, CKMB,  CKMBINDEX, TROPONINI,  in the last 72 hours No components found with this basename: POCBNP,  No results found for this basename: DDIMER,  in the last 72 hours  Recent Labs  06/29/13 0420  HGBA1C 10.6*   No results found for this basename: CHOL, HDL, LDLCALC, TRIG, CHOLHDL, LDLDIRECT,  in the last 72 hours No results found for this basename: TSH, T4TOTAL, FREET3, T3FREE, THYROIDAB,  in the last 72 hours No results found for this basename: VITAMINB12, FOLATE, FERRITIN, TIBC, IRON, RETICCTPCT,  in the last 72 hours Coags: No results  found for this basename: PT, INR,  in the last 72 hours Microbiology: Recent Results (from the past 240 hour(s))  CULTURE, BLOOD (ROUTINE X 2)     Status: None   Collection Time    06/28/13  6:05 PM      Result Value Ref Range Status   Specimen Description BLOOD LEFT ARM   Final   Special Requests BOTTLES DRAWN AEROBIC AND ANAEROBIC 10 CC   Final   Culture  Setup Time     Final   Value: 06/29/2013 00:37     Performed at [facility]   Culture     Final   Value:        BLOOD CULTURE RECEIVED NO GROWTH TO DATE CULTURE WILL BE HELD FOR 5 DAYS BEFORE ISSUING A FINAL NEGATIVE REPORT     Performed at [facility]   Report Status PENDING   Incomplete  CULTURE, BLOOD (ROUTINE X 2)     Status: None   Collection Time    06/28/13  6:10 PM      Result Value Ref Range Status   Specimen Description BLOOD RIGHT ARM   Final   Special Requests BOTTLES DRAWN AEROBIC AND ANAEROBIC 10 CC   Final   Culture  Setup Time     Final   Value: 06/29/2013 00:37     Performed at [facility]   Culture     Final   Value:        BLOOD CULTURE RECEIVED NO GROWTH TO DATE CULTURE WILL BE HELD FOR 5 DAYS BEFORE ISSUING A FINAL NEGATIVE REPORT     Performed at [facility]   Report Status PENDING   Incomplete  CULTURE, ROUTINE-ABSCESS     Status: None   Collection Time    06/30/13  2:20 PM      Result Value Ref Range Status   Specimen Description  ABSCESS SCROTUM   Final   Special Requests NONE   Final   Gram Stain     Final   Value: FEW WBC PRESENT,BOTH PMN AND MONONUCLEAR     RARE SQUAMOUS EPITHELIAL CELLS PRESENT     RARE GRAM POSITIVE COCCI     IN CLUSTERS     Performed at [facility]   Culture PENDING   Incomplete   Report Status PENDING   Incomplete     [place]:  Left Scrotal Cellulitis  -admitted and started on empiric Vanco/Zosyn-currently day 4  -Urology consulted, underwent I&D on 5/24- clinically improved with resolution of leukocytosis and significantly less pain.Wound culture 5/24 pending. Spoke with Dr Nessi-recommended discharge, he will follow up patient in his office in a few days and follow cultures. He recommended transitioning to Doxycycline on discharge. -CT Pelvis on admissionconfirms cellulitis, no evidence of compartment or gangrenous infection, Ultrasound neg for torsion.   SIR's  -secondary to above, resolved with IV Abx and I&D  -treat with IVF and Abx   Hyponatremia  -likely secondary to Dehydration  -resolved with IVF   ARF  -mild elevation in creatinine, recheck at PCP's office.  Diabetes mellitus, type 2  -c/w Levemir and SSI-follow CBG's and adjust accordingly. Metformin on hold-resume on discharge   TODAY-DAY OF DISCHARGE:  Subjective:   Jonathan Austin today has no headache,no chest abdominal pain,no new weakness tingling or numbness, feels much better wants to go home today.   Objective:   Blood pressure 143/91, pulse 95, temperature 98.5 F (36.9 C), temperature source  Oral, resp. rate 20, height 5\' 7"  (1.702 m), weight 96.4 kg (212 lb 8.4 oz), SpO2 97.00%.  Intake/Output Summary (Last 24 hours) at 07/01/13 1415 Last data filed at 06/30/13 1830  Gross per 24 hour  Intake    222 ml  Output      0 ml  Net    222 ml   Filed Weights   06/28/13 1226 06/28/13 2308 06/29/13 0300  Weight: 90.719 kg (200 lb) 96.4 kg (212 lb 8.4 oz) 96.4 kg (212 lb 8.4 oz)     Exam Awake Alert, Oriented *3, No new F.N deficits, Normal affect [place].AT,PERRAL Supple Neck,No JVD, No cervical lymphadenopathy appriciated.  Symmetrical Chest wall movement, Good air movement bilaterally, CTAB RRR,No Gallops,Rubs or new Murmurs, No Parasternal Heave +ve B.Sounds, Abd Soft, Non tender, No organomegaly appriciated, No rebound -guarding or rigidity. No Cyanosis, Clubbing or edema, No new Rash or bruise  DISCHARGE CONDITION: Stable  DISPOSITION: Home  DISCHARGE INSTRUCTIONS:    Activity:  As tolerated   Diet recommendation: Diabetic Diet Heart Healthy diet  Discharge Instructions   Call MD for:  redness, tenderness, or signs of infection (pain, swelling, redness, odor or green/yellow discharge around incision site)    Complete by:  As directed      Call MD for:  severe uncontrolled pain    Complete by:  As directed      Call MD for:  temperature >100.4    Complete by:  As directed      Diet - low sodium heart healthy    Complete by:  As directed      Diet Carb Modified    Complete by:  As directed      Increase activity slowly    Complete by:  As directed            Follow-up Information   Follow up with Arvil Persons, MD. Schedule an appointment as soon as possible for a visit in 3 days.   Specialty:  Urology   Contact information:   [place] [address] [phone]       Follow up with Elizabeth Palau, MD. Schedule an appointment as soon as possible for a visit in 1 week.   Specialty:  Family Medicine   Contact information:   [place] [address] [phone]      Total Time spent on discharge equals 45 minutes.  Signed: Henreitta Leber Ghimire 07/01/2013 2:15 PM  **Disclaimer: This note may have been dictated with voice recognition software. Similar sounding words can inadvertently be transcribed and this note may contain transcription errors which may not have been corrected upon  publication of note.**

## 2013-07-01 NOTE — Progress Notes (Signed)
Subjective: Patient reports: Less scrotal pain.  Feels better..  Voids well.  Objective: Vital signs in last 24 hours: Temp:  [98 F (36.7 C)-98.5 F (36.9 C)] 98.5 F (36.9 C) (05/25 1041) Pulse Rate:  [95-107] 95 (05/25 1041) Resp:  [18-20] 20 (05/25 1041) BP: (115-143)/(79-91) 143/91 mmHg (05/25 1041) SpO2:  [96 %-99 %] 97 % (05/25 1041)  Intake/Output from previous day: 05/24 0701 - 05/25 0700 In: 666 [P.O.:666] Out: -  Intake/Output this shift:    Physical Exam:  General: Scrotum less swollen and tender. Minimal scrotal drainage. Wound culture: pending.  Lab Results:  Recent Labs  06/29/13 0420 07/01/13 0450  HGB 13.1 12.4*  HCT 40.2 36.7*   BMET  Recent Labs  06/29/13 0420 07/01/13 0450  NA 135* 135*  K 4.2 3.9  CL 96 97  CO2 27 28  GLUCOSE 222* 146*  BUN 14 12  CREATININE 1.22 1.36*  CALCIUM 8.6 9.0   No results found for this basename: LABPT, INR,  in the last 72 hours No results found for this basename: LABURIN,  in the last 72 hours Results for orders placed during the hospital encounter of 06/28/13  CULTURE, BLOOD (ROUTINE X 2)     Status: None   Collection Time    06/28/13  6:05 PM      Result Value Ref Range Status   Specimen Description BLOOD LEFT ARM   Final   Special Requests BOTTLES DRAWN AEROBIC AND ANAEROBIC 10 CC   Final   Culture  Setup Time     Final   Value: 06/29/2013 00:37     Performed at [facility]   Culture     Final   Value:        BLOOD CULTURE RECEIVED NO GROWTH TO DATE CULTURE WILL BE HELD FOR 5 DAYS BEFORE ISSUING A FINAL NEGATIVE REPORT     Performed at [facility]   Report Status PENDING   Incomplete  CULTURE, BLOOD (ROUTINE X 2)     Status: None   Collection Time    06/28/13  6:10 PM      Result Value Ref Range Status   Specimen Description BLOOD RIGHT ARM   Final   Special Requests BOTTLES DRAWN AEROBIC AND ANAEROBIC 10 CC   Final   Culture  Setup Time     Final   Value: 06/29/2013  00:37     Performed at [facility]   Culture     Final   Value:        BLOOD CULTURE RECEIVED NO GROWTH TO DATE CULTURE WILL BE HELD FOR 5 DAYS BEFORE ISSUING A FINAL NEGATIVE REPORT     Performed at [facility]   Report Status PENDING   Incomplete  CULTURE, ROUTINE-ABSCESS     Status: None   Collection Time    06/30/13  2:20 PM      Result Value Ref Range Status   Specimen Description ABSCESS SCROTUM   Final   Special Requests NONE   Final   Gram Stain     Final   Value: FEW WBC PRESENT,BOTH PMN AND MONONUCLEAR     RARE SQUAMOUS EPITHELIAL CELLS PRESENT     RARE GRAM POSITIVE COCCI     IN CLUSTERS     Performed at [facility]   Culture PENDING   Incomplete   Report Status PENDING   Incomplete    Studies/Results: No results found.  Assessment/Plan:  Scrotal abscess  S/P incision and drainage scrotal abscess  Can be discharged urologically on doxycycline .    To be followed as outpatient.   LOS: 3 days   Arvil Persons 07/01/2013, 2:20 PM

## 2013-07-01 NOTE — Progress Notes (Signed)
Patient discharge teaching given, including activity, diet, follow-up appoints, and medications. Patient verbalized understanding of all discharge instructions. IV access was d/c'd. Vitals are stable. Skin is intact except as charted in most recent assessments. Pt to be escorted out by NT, to be driven home by family. 

## 2013-07-01 NOTE — Discharge Instructions (Signed)
Follow with Primary MD  Pcp Not In System  and other consultant as instructed your Hospitalist MD  Please get a complete blood count and chemistry panel checked by your Primary MD at your next visit, and again as instructed by your Primary MD.  Get Medicines reviewed and adjusted. Please take all your medications with you for your next visit with your Primary MD  Please request your Primary MD to go over all hospital tests and procedure/radiological results at the follow up, please ask your Primary MD to get all Hospital records sent to his/her office.  If you experience worsening of your admission symptoms, develop shortness of breath, life threatening emergency, suicidal or homicidal thoughts you must seek medical attention immediately by calling 911 or calling your MD immediately  if symptoms less severe.  You must read complete instructions/literature along with all the possible adverse reactions/side effects for all the Medicines you take and that have been prescribed to you. Take any new Medicines after you have completely understood and accpet all the possible adverse reactions/side effects.   Do not drive when taking Pain medications.   Do not take more than prescribed Pain, Sleep and Anxiety Medications  Special Instructions: If you have smoked or chewed Tobacco  in the last 2 yrs please stop smoking, stop any regular Alcohol  and or any Recreational drug use.  Wear Seat belts while driving.  Please note  You were cared for by a hospitalist during your hospital stay. Once you are discharged, your primary care physician will handle any further medical issues. Please note that NO REFILLS for any discharge medications will be authorized once you are discharged, as it is imperative that you return to your primary care physician (or establish a relationship with a primary care physician if you do not have one) for your aftercare needs so that they can reassess your need for medications and  monitor your lab values.

## 2013-07-01 NOTE — Progress Notes (Signed)
PATIENT DETAILS Name: Jonathan Austin Age: 48 y.o. Sex: male Date of Birth: [birth year] Admit Date: 06/28/2013 Admitting Physician Reece Packer, MD PCP:Pcp Not In System  Subjective: Significantly less pain, still some mild drainage from I&D site  Assessment/Plan: Left Scrotal Cellulitis  -admitted and started on empiric Vanco/Zosyn-currently day 4 -Urology consulted, underwent I&D on 5/24- clinically improved with resolution of leukocytosis and significantly less pain.Wound culture 5/24 pending -CT Pelvis on admissionconfirms cellulitis, no evidence of compartment or gangrenous infection, Ultrasound neg for torsion.  -Continue with antibiotics and follow clinical course   SIR's  -secondary to above, resolved with IV Abx and I&D -treat with IVF and Abx   Hyponatremia  -likely secondary to Dehydration  -resolved with IVF   ARF -mild elevation in creatinine, recheck in am  Diabetes mellitus, type 2  -c/w Levemir and SSI-follow CBG's and adjust accordingly. Metformin on hold-resume on discharge  HTN  -controlled with Lisinopril-cautiously continue for now-if creatinine worsens then will stop  Disposition: Remain inpatient  DVT Prophylaxis: Prophylactic Heparin   Code Status: Full code   Family Communication Spouse at bedside  Procedures:  None  CONSULTS:  urology  Time spent 40 minutes-which includes 50% of the time with face-to-face with patient/ family and coordinating care related to the above assessment and plan.    MEDICATIONS: Scheduled Meds: . heparin subcutaneous  5,000 Units Subcutaneous 3 times per day  . insulin aspart  0-9 Units Subcutaneous 6 times per day  . insulin aspart  6 Units Subcutaneous TID WC  . insulin detemir  22 Units Subcutaneous BID  . lisinopril  10 mg Oral Daily  . piperacillin-tazobactam (ZOSYN)  IV  3.375 g Intravenous 3 times per day  . vancomycin  1,250 mg Intravenous Q12H   Continuous Infusions:  PRN  Meds:.acetaminophen, acetaminophen, alum & mag hydroxide-simeth, HYDROmorphone (DILAUDID) injection, LORazepam, ondansetron (ZOFRAN) IV, ondansetron, oxyCODONE  Antibiotics: Anti-infectives   Start     Dose/Rate Route Frequency Ordered Stop   06/29/13 0600  vancomycin (VANCOCIN) 1,250 mg in sodium chloride 0.9 % 250 mL IVPB     1,250 mg 166.7 mL/hr over 90 Minutes Intravenous Every 12 hours 06/28/13 2243     06/28/13 2230  piperacillin-tazobactam (ZOSYN) IVPB 3.375 g     3.375 g 12.5 mL/hr over 240 Minutes Intravenous 3 times per day 06/28/13 2229     06/28/13 1515  piperacillin-tazobactam (ZOSYN) IVPB 3.375 g     3.375 g 12.5 mL/hr over 240 Minutes Intravenous  Once 06/28/13 1506 06/28/13 1604   06/28/13 1515  vancomycin (VANCOCIN) IVPB 1000 mg/200 mL premix     1,000 mg 200 mL/hr over 60 Minutes Intravenous  Once 06/28/13 1506 06/28/13 1714       PHYSICAL EXAM: Vital signs in last 24 hours: Filed Vitals:   06/30/13 2321 06/30/13 2349 07/01/13 0419 07/01/13 1041  BP:  115/79 141/80 143/91  Pulse: 99 100 100 95  Temp:  98 F (36.7 C) 98 F (36.7 C) 98.5 F (36.9 C)  TempSrc:  Oral Oral Oral  Resp: 20 20 20 20   Height:      Weight:      SpO2:  96% 98% 97%    Weight change:  Filed Weights   06/28/13 1226 06/28/13 2308 06/29/13 0300  Weight: 90.719 kg (200 lb) 96.4 kg (212 lb 8.4 oz) 96.4 kg (212 lb 8.4 oz)   Body mass index is 33.28 kg/(m^2).   Gen Exam: Awake  and alert with clear speech.   Neck: Supple, No JVD.   Chest: B/L Clear.   CVS: S1 S2 Regular, no murmurs. Abdomen: soft, BS +, non tender, non distended. Left scrotal area-I&D-packing in place Extremities: no edema, lower extremities warm to touch. Neurologic: Non Focal.   Skin: No Rash.   Wounds: N/A.    Intake/Output from previous day:  Intake/Output Summary (Last 24 hours) at 07/01/13 1229 Last data filed at 06/30/13 1830  Gross per 24 hour  Intake    444 ml  Output      0 ml  Net    444 ml      LAB RESULTS: CBC  Recent Labs Lab 06/28/13 1236 06/29/13 0420 07/01/13 0450  WBC 15.3* 14.4* 9.6  HGB 14.9 13.1 12.4*  HCT 43.5 40.2 36.7*  PLT 324 320 352  MCV 90.1 92.6 90.4  MCH 30.8 30.2 30.5  MCHC 34.3 32.6 33.8  RDW 12.8 13.0 12.6  LYMPHSABS 1.6  --   --   MONOABS 1.0  --   --   EOSABS 0.0  --   --   BASOSABS 0.0  --   --     Chemistries   Recent Labs Lab 06/28/13 1236 06/29/13 0420 07/01/13 0450  NA 129* 135* 135*  K 4.5 4.2 3.9  CL 91* 96 97  CO2 25 27 28   GLUCOSE 314* 222* 146*  BUN 15 14 12   CREATININE 1.07 1.22 1.36*  CALCIUM 9.6 8.6 9.0    CBG:  Recent Labs Lab 06/30/13 2041 06/30/13 2345 07/01/13 0422 07/01/13 0755 07/01/13 1203  GLUCAP 187* 139* 157* 119* 205*    GFR Estimated Creatinine Clearance: 74.3 ml/min (by C-G formula based on Cr of 1.36).  Coagulation profile No results found for this basename: INR, PROTIME,  in the last 168 hours  Cardiac Enzymes No results found for this basename: CK, CKMB, TROPONINI, MYOGLOBIN,  in the last 168 hours  No components found with this basename: POCBNP,  No results found for this basename: DDIMER,  in the last 72 hours  Recent Labs  06/29/13 0420  HGBA1C 10.6*   No results found for this basename: CHOL, HDL, LDLCALC, TRIG, CHOLHDL, LDLDIRECT,  in the last 72 hours No results found for this basename: TSH, T4TOTAL, FREET3, T3FREE, THYROIDAB,  in the last 72 hours No results found for this basename: VITAMINB12, FOLATE, FERRITIN, TIBC, IRON, RETICCTPCT,  in the last 72 hours No results found for this basename: LIPASE, AMYLASE,  in the last 72 hours  Urine Studies No results found for this basename: UACOL, UAPR, USPG, UPH, UTP, UGL, UKET, UBIL, UHGB, UNIT, UROB, ULEU, UEPI, UWBC, URBC, UBAC, CAST, CRYS, UCOM, BILUA,  in the last 72 hours  MICROBIOLOGY: Recent Results (from the past 240 hour(s))  CULTURE, BLOOD (ROUTINE X 2)     Status: None   Collection Time    06/28/13  6:05 PM       Result Value Ref Range Status   Specimen Description BLOOD LEFT ARM   Final   Special Requests BOTTLES DRAWN AEROBIC AND ANAEROBIC 10 CC   Final   Culture  Setup Time     Final   Value: 06/29/2013 00:37     Performed at [facility]   Culture     Final   Value:        BLOOD CULTURE RECEIVED NO GROWTH TO DATE CULTURE WILL BE HELD FOR 5 DAYS BEFORE ISSUING A FINAL NEGATIVE REPORT  Performed at [facility]   Report Status PENDING   Incomplete  CULTURE, BLOOD (ROUTINE X 2)     Status: None   Collection Time    06/28/13  6:10 PM      Result Value Ref Range Status   Specimen Description BLOOD RIGHT ARM   Final   Special Requests BOTTLES DRAWN AEROBIC AND ANAEROBIC 10 CC   Final   Culture  Setup Time     Final   Value: 06/29/2013 00:37     Performed at [facility]   Culture     Final   Value:        BLOOD CULTURE RECEIVED NO GROWTH TO DATE CULTURE WILL BE HELD FOR 5 DAYS BEFORE ISSUING A FINAL NEGATIVE REPORT     Performed at [facility]   Report Status PENDING   Incomplete    RADIOLOGY STUDIES/RESULTS: Ct Pelvis W Contrast  06/28/2013   CLINICAL DATA:  Pain and swelling in the left side of the scrotum.  EXAM: CT PELVIS AND UPPER THIGHS INCLUDING THE SCROTUM WITH CONTRAST  TECHNIQUE: Multidetector CT imaging of the pelvis and upper thighs including the scrotum was performed using the standard protocol following the bolus administration of intravenous contrast.  CONTRAST:  46mL OMNIPAQUE IOHEXOL 300 MG/ML  SOLN  COMPARISON:  CT SCAN OF THE PELVIS DATED 06/28/2013  FINDINGS: There is marked thickening of the wall of the scrotum. There is no inguinal hernia. Testicles are not enlarged. There is soft tissue stranding in the subcutaneous fat extending asymmetric to the left. There is no inguinal hernia or mass. There is reactive adenopathy in both inguinal regions. Penis appears normal. The soft tissue stranding, consistent with cellulitis, extends toward  the left inguinal region.  The structures in the pelvis appear normal including the bladder. No osseous abnormality.  IMPRESSION: Cellulitis of the scrotum extending toward the left inguinal region. The patient is at risk for developing Fournier's gangrene. However, at this time there is no involvement of the perineum.   Electronically Signed   By: Rozetta Nunnery M.D.   On: 06/28/2013 19:55   US Scrotum  06/28/2013   CLINICAL DATA:  Pain and swelling in scrotum on LEFT, began 3 days ago, progressively more swollen and painful  EXAM: SCROTAL ULTRASOUND  DOPPLER ULTRASOUND OF THE TESTICLES  TECHNIQUE: Complete ultrasound examination of the testicles, epididymis, and other scrotal structures was performed. Color and spectral Doppler ultrasound were also utilized to evaluate blood flow to the testicles.  COMPARISON:  None.  None  FINDINGS: Right testicle  Measurements: 3.7 x 2.1 x 3.1 cm. Normal morphology without mass or calcification. Internal blood flow present on color Doppler imaging.  Left testicle  Measurements: 3.0 x 2.1 x 2.8 cm. Normal morphology without mass or calcification. Internal blood flow present on color Doppler imaging.  Right epididymis:  Normal in size and appearance.  Left epididymis:  Normal in size and appearance.  Hydrocele:  Small RIGHT hydrocele.  Varicocele:  Absent bilaterally  Pulsed Doppler interrogation of both testes demonstrates low resistance arterial and venous waveforms bilaterally.  Incidentally noted are multiple normal-sized lymph nodes at LEFT inguinal canal at general region of pain. In addition, marked wall thickening of the wall of the upper LEFT scrotum is identified, up to 1.7 cm 6 over an area of 7.4 cm greatest diameter suggesting focal inflammatory process.  IMPRESSION: Unremarkable testes.  Small RIGHT hydrocele.  No evidence of testicular torsion or mass.  Significant wall  thickening of the upper LEFT scrotum suggesting an inflammatory process/cellulitis with few  likely reactive LEFT inguinal lymph nodes noted.   Electronically Signed   By: Lavonia Dana M.D.   On: 06/28/2013 13:52   Ct Abdomen Pelvis W Contrast  06/28/2013   CLINICAL DATA:  Pain and swelling left scrotum  EXAM: CT ABDOMEN AND PELVIS WITH CONTRAST  TECHNIQUE: Multidetector CT imaging of the abdomen and pelvis was performed using the standard protocol following bolus administration of intravenous contrast.  CONTRAST:  121mL OMNIPAQUE IOHEXOL 300 MG/ML  SOLN  COMPARISON:  Ultrasound of the scrotum same day and CT of the chest 12/09/2012  FINDINGS: Lung bases are unremarkable. Sagittal images of the spine shows mild disc space flattening at L5-S1 level. There is disc bulge at L5-S1 level.  Liver shows no focal mass.  Mild hepatic fatty infiltration.  The pancreas, spleen and left adrenal is unremarkable. A right adrenal nodule measures 2 cm stable in size in appearance from prior exam. A right adrenal nodule measures 2 cm stable in size in appearance from prior exam. No calcified gallstones are noted within gallbladder.  No small bowel obstruction. No aortic aneurysm. No ascites or free air. Normal appendix. No pericecal inflammation. The terminal ileum is unremarkable. The urinary bladder is unremarkable. Prostate gland and seminal vesicles are unremarkable. Kidneys are symmetrical in size and enhancement. No hydronephrosis or hydroureter.  Delayed renal images shows bilateral renal symmetrical excretion. Bilateral visualized proximal ureter is unremarkable.  No pelvic ascites or adenopathy. A right inguinal lymph node measures 1.6 by 0.9 cm. A left inguinal lymph node measures 1.7 x 1.4 cm. Second left inguinal lymph node measures 1.4 x 1 cm. A third left inguinal lymph node measures 1.7 by 1.2 cm. These are probable reactive. No evidence of inguinal hernia or inguinal mass.  IMPRESSION: 1. Mild hepatic fatty infiltration.  No focal hepatic mass. 2. Stable nodular lesion right adrenal gland probable benign  in nature. 3. Normal appendix.  No pericecal inflammation. 4. There are mild enlarged left inguinal lymph nodes probable reactive in nature. 5. Unremarkable urinary bladder. No hydronephrosis or hydroureter. Bilateral renal symmetrical excretion.   Electronically Signed   By: Lahoma Crocker M.D.   On: 06/28/2013 17:55   [place] Art/ven Flow Abd Pelv Doppler  06/28/2013   CLINICAL DATA:  Pain and swelling in scrotum on LEFT, began 3 days ago, progressively more swollen and painful  EXAM: SCROTAL ULTRASOUND  DOPPLER ULTRASOUND OF THE TESTICLES  TECHNIQUE: Complete ultrasound examination of the testicles, epididymis, and other scrotal structures was performed. Color and spectral Doppler ultrasound were also utilized to evaluate blood flow to the testicles.  COMPARISON:  None.  None  FINDINGS: Right testicle  Measurements: 3.7 x 2.1 x 3.1 cm. Normal morphology without mass or calcification. Internal blood flow present on color Doppler imaging.  Left testicle  Measurements: 3.0 x 2.1 x 2.8 cm. Normal morphology without mass or calcification. Internal blood flow present on color Doppler imaging.  Right epididymis:  Normal in size and appearance.  Left epididymis:  Normal in size and appearance.  Hydrocele:  Small RIGHT hydrocele.  Varicocele:  Absent bilaterally  Pulsed Doppler interrogation of both testes demonstrates low resistance arterial and venous waveforms bilaterally.  Incidentally noted are multiple normal-sized lymph nodes at LEFT inguinal canal at general region of pain. In addition, marked wall thickening of the wall of the upper LEFT scrotum is identified, up to 1.7 cm 6 over an area of 7.4 cm  greatest diameter suggesting focal inflammatory process.  IMPRESSION: Unremarkable testes.  Small RIGHT hydrocele.  No evidence of testicular torsion or mass.  Significant wall thickening of the upper LEFT scrotum suggesting an inflammatory process/cellulitis with few likely reactive LEFT inguinal lymph nodes noted.    Electronically Signed   By: Lavonia Dana M.D.   On: 06/28/2013 13:52    Henrietta Cieslewicz Kristeen Mans, MD  [facility] Pager:[phone]  If 7PM-7AM, please contact night-coverage [email or web address] Password [email or web address] 07/01/2013, 12:29 PM   LOS: 3 days   **Disclaimer: This note may have been dictated with voice recognition software. Similar sounding words can inadvertently be transcribed and this note may contain transcription errors which may not have been corrected upon publication of note.**

## 2013-07-03 LAB — CULTURE, ROUTINE-ABSCESS

## 2013-07-03 NOTE — Progress Notes (Signed)
Utilization review completed.  

## 2013-07-05 LAB — CULTURE, BLOOD (ROUTINE X 2)
CULTURE: NO GROWTH
Culture: NO GROWTH

## 2014-02-12 ENCOUNTER — Emergency Department (HOSPITAL_COMMUNITY): Payer: Self-pay

## 2014-02-12 ENCOUNTER — Inpatient Hospital Stay (HOSPITAL_COMMUNITY)
Admission: EM | Admit: 2014-02-12 | Discharge: 2014-02-14 | DRG: 189 | Disposition: A | Discharge status: Home / Self Care | Payer: Self-pay | Attending: Internal Medicine | Admitting: Internal Medicine

## 2014-02-12 ENCOUNTER — Ambulatory Visit (HOSPITAL_COMMUNITY): Payer: Self-pay

## 2014-02-12 ENCOUNTER — Encounter (HOSPITAL_COMMUNITY): Payer: Self-pay | Admitting: *Deleted

## 2014-02-12 DIAGNOSIS — R000 Tachycardia, unspecified: Secondary | ICD-10-CM

## 2014-02-12 DIAGNOSIS — E1165 Type 2 diabetes mellitus with hyperglycemia: Secondary | ICD-10-CM

## 2014-02-12 DIAGNOSIS — J9601 Acute respiratory failure with hypoxia: Principal | ICD-10-CM | POA: Diagnosis present

## 2014-02-12 DIAGNOSIS — B349 Viral infection, unspecified: Secondary | ICD-10-CM | POA: Diagnosis present

## 2014-02-12 DIAGNOSIS — R509 Fever, unspecified: Secondary | ICD-10-CM

## 2014-02-12 DIAGNOSIS — E1169 Type 2 diabetes mellitus with other specified complication: Secondary | ICD-10-CM

## 2014-02-12 DIAGNOSIS — Z794 Long term (current) use of insulin: Secondary | ICD-10-CM

## 2014-02-12 DIAGNOSIS — F1290 Cannabis use, unspecified, uncomplicated: Secondary | ICD-10-CM | POA: Diagnosis present

## 2014-02-12 DIAGNOSIS — J988 Other specified respiratory disorders: Secondary | ICD-10-CM | POA: Diagnosis present

## 2014-02-12 DIAGNOSIS — G4730 Sleep apnea, unspecified: Secondary | ICD-10-CM | POA: Diagnosis present

## 2014-02-12 DIAGNOSIS — J9600 Acute respiratory failure, unspecified whether with hypoxia or hypercapnia: Secondary | ICD-10-CM | POA: Diagnosis present

## 2014-02-12 DIAGNOSIS — F17210 Nicotine dependence, cigarettes, uncomplicated: Secondary | ICD-10-CM | POA: Diagnosis present

## 2014-02-12 DIAGNOSIS — J40 Bronchitis, not specified as acute or chronic: Secondary | ICD-10-CM | POA: Diagnosis present

## 2014-02-12 DIAGNOSIS — E669 Obesity, unspecified: Secondary | ICD-10-CM | POA: Diagnosis present

## 2014-02-12 DIAGNOSIS — I471 Supraventricular tachycardia: Secondary | ICD-10-CM

## 2014-02-12 DIAGNOSIS — I10 Essential (primary) hypertension: Secondary | ICD-10-CM | POA: Diagnosis present

## 2014-02-12 DIAGNOSIS — N289 Disorder of kidney and ureter, unspecified: Secondary | ICD-10-CM | POA: Diagnosis present

## 2014-02-12 DIAGNOSIS — R6889 Other general symptoms and signs: Secondary | ICD-10-CM

## 2014-02-12 DIAGNOSIS — Z6835 Body mass index (BMI) 35.0-35.9, adult: Secondary | ICD-10-CM

## 2014-02-12 DIAGNOSIS — E1129 Type 2 diabetes mellitus with other diabetic kidney complication: Secondary | ICD-10-CM

## 2014-02-12 DIAGNOSIS — E119 Type 2 diabetes mellitus without complications: Secondary | ICD-10-CM | POA: Diagnosis present

## 2014-02-12 LAB — COMPREHENSIVE METABOLIC PANEL
ALT: 22 U/L (ref 0–53)
ANION GAP: 11 (ref 5–15)
AST: 18 U/L (ref 0–37)
Albumin: 3.6 g/dL (ref 3.5–5.2)
Alkaline Phosphatase: 57 U/L (ref 39–117)
BUN: 8 mg/dL (ref 6–23)
CO2: 27 mmol/L (ref 19–32)
CREATININE: 1.26 mg/dL (ref 0.50–1.35)
Calcium: 9 mg/dL (ref 8.4–10.5)
Chloride: 97 mEq/L (ref 96–112)
GFR calc Af Amer: 76 mL/min — ABNORMAL LOW (ref >90)
GFR calc non Af Amer: 66 mL/min — ABNORMAL LOW (ref >90)
Glucose, Bld: 302 mg/dL — ABNORMAL HIGH (ref 70–99)
POTASSIUM: 4.2 mmol/L (ref 3.5–5.1)
SODIUM: 135 mmol/L (ref 135–145)
Total Bilirubin: 0.8 mg/dL (ref 0.3–1.2)
Total Protein: 7 g/dL (ref 6.0–8.3)

## 2014-02-12 LAB — CBC WITH DIFFERENTIAL/PLATELET
Basophils Absolute: 0 10*3/uL (ref 0.0–0.1)
Basophils Relative: 0 % (ref 0–1)
EOS ABS: 0.1 10*3/uL (ref 0.0–0.7)
EOS PCT: 1 % (ref 0–5)
HCT: 46.9 % (ref 39.0–52.0)
HEMOGLOBIN: 16 g/dL (ref 13.0–17.0)
LYMPHS PCT: 19 % (ref 12–46)
Lymphs Abs: 1.9 10*3/uL (ref 0.7–4.0)
MCH: 30 pg (ref 26.0–34.0)
MCHC: 34.1 g/dL (ref 30.0–36.0)
MCV: 87.8 fL (ref 78.0–100.0)
Monocytes Absolute: 1.1 10*3/uL — ABNORMAL HIGH (ref 0.1–1.0)
Monocytes Relative: 12 % (ref 3–12)
Neutro Abs: 6.5 10*3/uL (ref 1.7–7.7)
Neutrophils Relative %: 68 % (ref 43–77)
PLATELETS: 313 10*3/uL (ref 150–400)
RBC: 5.34 MIL/uL (ref 4.22–5.81)
RDW: 13 % (ref 11.5–15.5)
WBC: 9.6 10*3/uL (ref 4.0–10.5)

## 2014-02-12 LAB — INFLUENZA PANEL BY PCR (TYPE A & B)
H1N1 flu by pcr: NOT DETECTED
Influenza A By PCR: NEGATIVE
Influenza B By PCR: NEGATIVE

## 2014-02-12 LAB — GLUCOSE, CAPILLARY: GLUCOSE-CAPILLARY: 398 mg/dL — AB (ref 70–99)

## 2014-02-12 LAB — CBG MONITORING, ED
Glucose-Capillary: 299 mg/dL — ABNORMAL HIGH (ref 70–99)
Glucose-Capillary: 306 mg/dL — ABNORMAL HIGH (ref 70–99)
Glucose-Capillary: 296 mg/dL — ABNORMAL HIGH (ref 70–99)
Glucose-Capillary: 229 mg/dL — ABNORMAL HIGH (ref 70–99)

## 2014-02-12 LAB — I-STAT CG4 LACTIC ACID, ED: Lactic Acid, Venous: 1.15 mmol/L (ref 0.5–2.2)

## 2014-02-12 MED ORDER — ACETAMINOPHEN 325 MG PO TABS
650.0000 mg | ORAL_TABLET | Freq: Once | ORAL | Status: AC
  Administered 2014-02-12: 650 mg via ORAL
  Filled 2014-02-12: qty 2

## 2014-02-12 MED ORDER — SODIUM CHLORIDE 0.9 % IJ SOLN
3.0000 mL | Freq: Two times a day (BID) | INTRAMUSCULAR | Status: DC
  Administered 2014-02-13: 3 mL via INTRAVENOUS

## 2014-02-12 MED ORDER — ONDANSETRON HCL 4 MG PO TABS: 4.0000 mg | ORAL_TABLET | Freq: Four times a day (QID) | ORAL | Status: DC | PRN

## 2014-02-12 MED ORDER — KETOROLAC TROMETHAMINE 30 MG/ML IJ SOLN
30.0000 mg | Freq: Once | INTRAMUSCULAR | Status: AC
  Administered 2014-02-12: 30 mg via INTRAVENOUS
  Filled 2014-02-12: qty 1

## 2014-02-12 MED ORDER — ACETAMINOPHEN 650 MG RE SUPP: 650.0000 mg | Freq: Four times a day (QID) | RECTAL | Status: DC | PRN

## 2014-02-12 MED ORDER — SODIUM CHLORIDE 0.9 % IV BOLUS (SEPSIS)
1000.0000 mL | Freq: Once | INTRAVENOUS | Status: AC
  Administered 2014-02-12: 1000 mL via INTRAVENOUS

## 2014-02-12 MED ORDER — METOCLOPRAMIDE HCL 5 MG/ML IJ SOLN
10.0000 mg | Freq: Once | INTRAMUSCULAR | Status: AC
  Administered 2014-02-12: 10 mg via INTRAVENOUS
  Filled 2014-02-12: qty 2

## 2014-02-12 MED ORDER — ONDANSETRON HCL 4 MG/2ML IJ SOLN: 4.0000 mg | Freq: Four times a day (QID) | INTRAMUSCULAR | Status: DC | PRN

## 2014-02-12 MED ORDER — LISINOPRIL 10 MG PO TABS
10.0000 mg | ORAL_TABLET | Freq: Every day | ORAL | Status: DC
  Administered 2014-02-12: 10 mg via ORAL
  Filled 2014-02-12 (×2): qty 1

## 2014-02-12 MED ORDER — DIPHENHYDRAMINE HCL 50 MG/ML IJ SOLN
25.0000 mg | Freq: Once | INTRAMUSCULAR | Status: AC
  Administered 2014-02-12: 25 mg via INTRAVENOUS
  Filled 2014-02-12: qty 1

## 2014-02-12 MED ORDER — IOHEXOL 350 MG/ML SOLN
100.0000 mL | Freq: Once | INTRAVENOUS | Status: AC | PRN
  Administered 2014-02-12: 80 mL via INTRAVENOUS

## 2014-02-12 MED ORDER — SODIUM CHLORIDE 0.9 % IV SOLN
INTRAVENOUS | Status: DC
  Administered 2014-02-12 – 2014-02-13 (×2): via INTRAVENOUS

## 2014-02-12 MED ORDER — INSULIN DETEMIR 100 UNIT/ML ~~LOC~~ SOLN
20.0000 [IU] | Freq: Every day | SUBCUTANEOUS | Status: DC
Start: 2014-02-12 — End: 2014-02-14
  Administered 2014-02-12 – 2014-02-13 (×2): 20 [IU] via SUBCUTANEOUS
  Filled 2014-02-12 (×3): qty 0.2

## 2014-02-12 MED ORDER — SODIUM CHLORIDE 0.9 % IV BOLUS (SEPSIS)
1000.0000 mL | Freq: Once | INTRAVENOUS | Status: AC
Start: 2014-02-12 — End: 2014-02-12
  Administered 2014-02-12: 1000 mL via INTRAVENOUS

## 2014-02-12 MED ORDER — INSULIN ASPART 100 UNIT/ML ~~LOC~~ SOLN
0.0000 [IU] | Freq: Three times a day (TID) | SUBCUTANEOUS | Status: DC
  Administered 2014-02-13 (×2): 5 [IU] via SUBCUTANEOUS
  Administered 2014-02-13: 3 [IU] via SUBCUTANEOUS
  Administered 2014-02-14: 5 [IU] via SUBCUTANEOUS
  Administered 2014-02-14: 1 [IU] via SUBCUTANEOUS

## 2014-02-12 MED ORDER — ACETAMINOPHEN 325 MG PO TABS
650.0000 mg | ORAL_TABLET | Freq: Four times a day (QID) | ORAL | Status: DC | PRN
  Administered 2014-02-12 – 2014-02-14 (×4): 650 mg via ORAL
  Filled 2014-02-12 (×3): qty 2

## 2014-02-12 MED ORDER — ENOXAPARIN SODIUM 40 MG/0.4ML ~~LOC~~ SOLN
40.0000 mg | Freq: Every day | SUBCUTANEOUS | Status: DC
  Administered 2014-02-12 – 2014-02-13 (×2): 40 mg via SUBCUTANEOUS
  Filled 2014-02-12 (×3): qty 0.4

## 2014-02-12 MED ORDER — INSULIN ASPART 100 UNIT/ML ~~LOC~~ SOLN
0.0000 [IU] | Freq: Every day | SUBCUTANEOUS | Status: DC
  Administered 2014-02-12: 5 [IU] via SUBCUTANEOUS

## 2014-02-12 NOTE — ED Notes (Signed)
Attempted to call report to floor 

## 2014-02-12 NOTE — ED Notes (Signed)
Pulse ox 88-92% while ambulating on room air-- pt becomes short of breath when ambulating also--

## 2014-02-12 NOTE — ED Notes (Signed)
Pt in c/o cough, congestion, body aches, headache, states this feels like pneumonia when he had it in the past, no distress noted

## 2014-02-12 NOTE — ED Provider Notes (Signed)
CSN: [id number]     Arrival date & time 02/12/14  0825 History   First MD Initiated Contact with Patient 02/12/14 0840     Chief Complaint  Patient presents with  . Cough     (Consider location/radiation/quality/duration/timing/severity/associated sxs/prior Treatment) HPI  Jonathan Austin is a 49 y.o. male with PMH of DM and sleep apnea presenting with 2-3 days of cough productive of thick yellow sputum, congestion, generalized body aches, headaches, low-grade fevers. Patient states last night he took NyQuil which improved his symptoms. Patient reports taking his insulin as prescribed. He reports his blood sugars have been 120s 130s. Patient has generalized body aches but denies any chest pain, shortness of breath, nausea, vomiting, abdominal pain, back pain. Patient states his mother and not hypotensive with flulike symptoms. Patient concerned this could be pneumonia because it feels like it has in the past.   Past Medical History  Diagnosis Date  . Diabetes mellitus   . Sleep apnea    Past Surgical History  Procedure Laterality Date  . Tonsillectomy     Family History  Problem Relation Age of Onset  . Hypertension Mother   . Hypertension Father   . Diabetes Father    History  Substance Use Topics  . Smoking status: Current Some Day Smoker    Types: Cigars  . Smokeless tobacco: Not on file  . Alcohol Use: 1.8 oz/week    1 Glasses of wine, 1 Cans of beer, 1 Shots of liquor per week     Comment: Occasionally    Review of Systems  Constitutional: Positive for fever and chills.  HENT: Positive for congestion, rhinorrhea, sinus pressure and sore throat.   Eyes: Negative for visual disturbance.  Respiratory: Positive for cough. Negative for shortness of breath.   Cardiovascular: Negative for chest pain and palpitations.  Gastrointestinal: Negative for nausea, vomiting and diarrhea.  Musculoskeletal: Negative for back pain and gait problem.  Skin: Negative for rash.   Neurological: Positive for headaches. Negative for weakness.      Allergies  Review of patient's allergies indicates no known allergies.  Home Medications   Prior to Admission medications   Medication Sig Start Date End Date Taking? Authorizing Provider  acetaminophen (TYLENOL) 500 MG tablet Take 1,000 mg by mouth every 6 (six) hours as needed for mild pain.    Yes Historical Provider, MD  glipiZIDE (GLUCOTROL XL) 5 MG 24 hr tablet Take 5 mg by mouth daily with breakfast.   Yes Historical Provider, MD  insulin aspart (NOVOLOG) 100 UNIT/ML injection Inject 4-12 Units into the skin 2 (two) times daily before a meal. Sliding scale   Yes Historical Provider, MD  insulin detemir (LEVEMIR) 100 UNIT/ML injection Inject 20 Units into the skin 2 (two) times daily.   Yes Historical Provider, MD  lisinopril (PRINIVIL,ZESTRIL) 10 MG tablet Take 10 mg by mouth daily.   Yes Historical Provider, MD  metFORMIN (GLUCOPHAGE) 500 MG tablet Take 500 mg by mouth daily.    Yes Historical Provider, MD  doxycycline (VIBRAMYCIN) 100 MG capsule Take 1 capsule (100 mg total) by mouth 2 (two) times daily. Patient not taking: Reported on 02/12/2014 07/01/13   Jonetta Osgood, MD  Multiple Vitamins-Minerals (MULTIVITAMIN PO) Take 1 tablet by mouth daily.    Historical Provider, MD  oxyCODONE (OXY IR/ROXICODONE) 5 MG immediate release tablet Take 1 tablet (5 mg total) by mouth every 6 (six) hours as needed for moderate pain. Patient not taking: Reported on 02/12/2014 07/01/13  Shanker Kristeen Mans, MD   BP 142/103 mmHg  Pulse 117  Temp(Src) 99.7 F (37.6 C) (Oral)  Resp 13  SpO2 94% Physical Exam  Constitutional: He appears well-developed and well-nourished. No distress.  HENT:  Head: Normocephalic and atraumatic.  Nose: Right sinus exhibits no maxillary sinus tenderness and no frontal sinus tenderness. Left sinus exhibits no maxillary sinus tenderness and no frontal sinus tenderness.  Mouth/Throat: Mucous membranes  are normal. Posterior oropharyngeal erythema present. No oropharyngeal exudate or posterior oropharyngeal edema.  Eyes: Conjunctivae and EOM are normal. Right eye exhibits discharge. Left eye exhibits discharge.  Clear thin drainage.  Neck: Normal range of motion. Neck supple.  Cardiovascular: Normal rate, regular rhythm and normal heart sounds.   Pulmonary/Chest: Effort normal and breath sounds normal. No respiratory distress. He has no wheezes. He has no rales.  Abdominal: Soft. Bowel sounds are normal. He exhibits no distension. There is no tenderness.  Lymphadenopathy:    He has cervical adenopathy.  Neurological: He is alert.  Skin: Skin is warm and dry. He is not diaphoretic.  Nursing note and vitals reviewed.   ED Course  Procedures (including critical care time) Labs Review Labs Reviewed  COMPREHENSIVE METABOLIC PANEL - Abnormal; Notable for the following:    Glucose, Bld 302 (*)    GFR calc non Af Amer 66 (*)    GFR calc Af Amer 76 (*)    All other components within normal limits  CBC WITH DIFFERENTIAL - Abnormal; Notable for the following:    Monocytes Absolute 1.1 (*)    All other components within normal limits  CBG MONITORING, ED - Abnormal; Notable for the following:    Glucose-Capillary 299 (*)    All other components within normal limits  CBG MONITORING, ED - Abnormal; Notable for the following:    Glucose-Capillary 306 (*)    All other components within normal limits  CBG MONITORING, ED - Abnormal; Notable for the following:    Glucose-Capillary 296 (*)    All other components within normal limits  CBG MONITORING, ED - Abnormal; Notable for the following:    Glucose-Capillary 229 (*)    All other components within normal limits  CULTURE, BLOOD (ROUTINE X 2)  CULTURE, BLOOD (ROUTINE X 2)  INFLUENZA PANEL BY PCR (TYPE A & B, H1N1)  I-STAT CG4 LACTIC ACID, ED    Imaging Review Dg Chest 2 View  02/12/2014   CLINICAL DATA:  Cough and fever.  EXAM: CHEST  2  VIEW  COMPARISON:  CT 12/09/2012.  Chest x-ray 12/09/2012 peer  FINDINGS: Mediastinum and hilar structures are normal. Previously identified right base infiltrate has partially cleared. Heart size normal. No pleural effusion or pneumothorax. No acute bony abnormality .  IMPRESSION: Previously identified right base pulmonary infiltrate has partially cleared.   Electronically Signed   By: Marcello Moores  Register   On: 02/12/2014 10:22     EKG Interpretation   Date/Time:  Wednesday February 12 2014 08:44:17 EST Ventricular Rate:  131 PR Interval:  145 QRS Duration: 74 QT Interval:  294 QTC Calculation: 434 R Axis:   66 Text Interpretation:  Sinus tachycardia LAE, consider biatrial enlargement  No significant change since last tracing Confirmed by ZACKOWSKI  MD, SCOTT  ([id number]) on 02/12/2014 8:48:44 AM      MDM   Final diagnoses:  Acute respiratory failure  Diabetes mellitus type 2 in obese  Flu-like symptoms   Patient with history of diabetes presenting with flulike symptoms. Patient found to be  febrile in ED with tachycardia. His CBG was 300. He's been given fluids without significant improvement of his pulse. Patient was sleeping and his oxygen saturations went down to 85% he was placed on 3 L oxygen with improvement of his oxygen stats. Pt without leukocytosis. CXR without acute infiltrate. Concern with pt persistent tachycardia, desaturations with ambulation and while sleeping and acute respiratory distress. Symptoms likely due to viral syndrome. PCR for full intensive pending. Consult internal medicine unassigned. Spoke with Dr. Shanon Brow Tat who agrees to evaluate patient and for admission.    Discussed return precautions with patient. Discussed all results and patient verbalizes understanding and agrees with plan.  This is a shared patient. This patient was discussed with the physician, Dr. Rogene Houston who saw and evaluated the patient and agrees with the plan.   Pura Spice, PA-C 02/12/14  1636  Fredia Sorrow, MD 02/13/14 [phone]

## 2014-02-12 NOTE — ED Notes (Signed)
Attempt to call report to floor.

## 2014-02-12 NOTE — H&P (Signed)
History and Physical  Jonathan Austin WUJ:[id number] DOB: 1965/12/21 DOA: 02/12/2014   PCP: Pcp Not In System   Chief Complaint: Fever, myalgias, dyspnea on exertion  HPI:  49 year old male with a history of diabetes mellitus, hypertension, and sleep apnea presents with 3 day history of whole body myalgias and arthralgias, sore throat, some dyspnea on exertion, productive cough with yellow sputum, and generalized weakness. The patient states that his mother and aunt also had a same illness around Christmas time. The patient did not take his temperature at home, but had subjective fevers, chills, and rigors. The patient denies any chest discomfort, hemoptysis, nausea, vomiting, diarrhea, dysuria, hematuria, hematochezia, melena. The patient does have some abdominal pain particularly when he coughs that wraps around his upper abdomen. The patient had a bowel movement this morning and is passing flatus. The patient has not been on antibiotics in the outpatient setting. He denies any rashes or synovitis.  The patient denies any history of tobacco use, and he has not had any exposure to secondhand smoke.  In the emergency department, the patient was noted to have tachycardia up to 134 which improves slightly into the upper 110s and low 120s after 2 L of fluid. He was noted to have a temperature 100.74F. The patient was noted to be hypoxemic. When he was ambulated, he desaturated to 88%. While he was sleeping, he desaturated to 86%. He was placed on 3 L nasal cannula with oxygen saturation of 95-96 percent. Influenza PCR was obtained in the emergency department. Chest x-ray was negative for any consolidations. EKG shows sinus tachycardia without any ST changes. Assessment/Plan: Acute respiratory failure -Suspect due to viral respiratory illness -Viral respiratory panel including influenza PCR -Supplemental oxygen -CT angiogram of the chest given the patient's dyspnea on exertion, hypoxemia, and sinus  tachycardia -Echocardiogram -HIV antibody Fever with generalized weakness -Influenza PCR -Blood cultures 2 sets -UA and urine culture -Continue IV fluids -remain off abx for now as pt is hemodynamically stable Diabetes mellitus type 2 -Hemoglobin A1c -Start half home dose of Levemir -NovoLog sliding scale Sinus tachycardia -Likely due to the patient's infectious process, but cannot rule out underlying PE Given the patient's hypoxemia and negative chest x-ray -CT angiogram chest as discussed -Continue IV fluids -TSH -Echocardiogram Hypertension -Continue lisinopril History of cocaine use -Urine drug screen      Past Medical History  Diagnosis Date  . Diabetes mellitus   . Sleep apnea    Past Surgical History  Procedure Laterality Date  . Tonsillectomy     Social History:  reports that he has been smoking Cigars.  He does not have any smokeless tobacco history on file. He reports that he drinks about 1.8 oz of alcohol per week. He reports that he uses illicit drugs (Marijuana and Cocaine).   Family History  Problem Relation Age of Onset  . Hypertension Mother   . Hypertension Father   . Diabetes Father      No Known Allergies    Prior to Admission medications   Medication Sig Start Date End Date Taking? Authorizing Provider  acetaminophen (TYLENOL) 500 MG tablet Take 1,000 mg by mouth every 6 (six) hours as needed for mild pain.    Yes Historical Provider, MD  glipiZIDE (GLUCOTROL XL) 5 MG 24 hr tablet Take 5 mg by mouth daily with breakfast.   Yes Historical Provider, MD  insulin aspart (NOVOLOG) 100 UNIT/ML injection Inject 4-12 Units into the skin 2 (two) times daily before  a meal. Sliding scale   Yes Historical Provider, MD  insulin detemir (LEVEMIR) 100 UNIT/ML injection Inject 20 Units into the skin 2 (two) times daily.   Yes Historical Provider, MD  lisinopril (PRINIVIL,ZESTRIL) 10 MG tablet Take 10 mg by mouth daily.   Yes Historical Provider, MD    metFORMIN (GLUCOPHAGE) 500 MG tablet Take 500 mg by mouth daily.    Yes Historical Provider, MD  doxycycline (VIBRAMYCIN) 100 MG capsule Take 1 capsule (100 mg total) by mouth 2 (two) times daily. Patient not taking: Reported on 02/12/2014 07/01/13   Jonetta Osgood, MD  Multiple Vitamins-Minerals (MULTIVITAMIN PO) Take 1 tablet by mouth daily.    Historical Provider, MD  oxyCODONE (OXY IR/ROXICODONE) 5 MG immediate release tablet Take 1 tablet (5 mg total) by mouth every 6 (six) hours as needed for moderate pain. Patient not taking: Reported on 02/12/2014 07/01/13   Jonetta Osgood, MD    Review of Systems:  Constitutional:  No weight loss, night sweats Head&Eyes: No headache.  No vision loss.  No eye pain or scotoma ENT:  No Difficulty swallowing,Tooth/dental problems,Sore throat,   Cardio-vascular:  No chest pain, Orthopnea, PND, swelling in lower extremities,  dizziness, palpitations  GI:  No  abdominal pain, nausea, vomiting, diarrhea, loss of appetite, hematochezia, melena, heartburn, indigestion, Resp:  .No wheezing.No chest wall deformity  Skin:  no rash or lesions.  GU:  no dysuria, change in color of urine, no urgency or frequency. No flank pain.  Musculoskeletal:  No joint pain or swelling. No decreased range of motion. No back pain.  Psych:  No change in mood or affect. No depression or anxiety. Neurologic: No headache, no dysesthesia, no focal weakness, no vision loss. No syncope  Physical Exam: Filed Vitals:   02/12/14 1119 02/12/14 1130 02/12/14 1145 02/12/14 1230  BP: 136/102 140/96 141/97 128/87  Pulse: 119 115 115 122  Temp:    98.1 F (36.7 C)  TempSrc:    Oral  Resp:  17 19 22   SpO2: 93% 97% 93% 95%   General:  A&O x 3, NAD, nontoxic, pleasant/cooperative Head/Eye: No conjunctival hemorrhage, no icterus, [place]/AT, No nystagmus ENT:  No icterus,  No thrush, good dentition, no pharyngeal exudate; shotty anterior cervical adenopathy Neck:  No masses, no  bruits CV:  RRR, no rub, no gallop, no S3 Lung:  CTAB, good air movement, no wheeze, no rhonchi Abdomen: soft/NT, +BS, nondistended, no peritoneal signs Ext: No cyanosis, No rashes, No petechiae, No lymphangitis, No edema   Labs on Admission:  Basic Metabolic Panel:  Recent Labs Lab 02/12/14 0844  NA 135  K 4.2  CL 97  CO2 27  GLUCOSE 302*  BUN 8  CREATININE 1.26  CALCIUM 9.0   Liver Function Tests:  Recent Labs Lab 02/12/14 0844  AST 18  ALT 22  ALKPHOS 57  BILITOT 0.8  PROT 7.0  ALBUMIN 3.6   No results for input(s): LIPASE, AMYLASE in the last 168 hours. No results for input(s): AMMONIA in the last 168 hours. CBC:  Recent Labs Lab 02/12/14 0844  WBC 9.6  NEUTROABS 6.5  HGB 16.0  HCT 46.9  MCV 87.8  PLT 313   Cardiac Enzymes: No results for input(s): CKTOTAL, CKMB, CKMBINDEX, TROPONINI in the last 168 hours. BNP: Invalid input(s): POCBNP CBG:  Recent Labs Lab 02/12/14 0851 02/12/14 1032 02/12/14 1333  GLUCAP 299* 306* 296*    Radiological Exams on Admission: Dg Chest 2 View  02/12/2014  CLINICAL DATA:  Cough and fever.  EXAM: CHEST  2 VIEW  COMPARISON:  CT 12/09/2012.  Chest x-ray 12/09/2012 peer  FINDINGS: Mediastinum and hilar structures are normal. Previously identified right base infiltrate has partially cleared. Heart size normal. No pleural effusion or pneumothorax. No acute bony abnormality .  IMPRESSION: Previously identified right base pulmonary infiltrate has partially cleared.   Electronically Signed   By: Marcello Moores  Register   On: 02/12/2014 10:22    EKG: Independently reviewed. Sinus rhythm, no ST-T wave changes    Time spent:60 minutes Code Status:   FULL Family Communication:   Wife updated at bedside   Hallie Ishida, DO  [facility] Pager [phone]  If 7PM-7AM, please contact night-coverage [email or web address] Password [email or web address] 02/12/2014, 1:54 PM

## 2014-02-12 NOTE — ED Notes (Signed)
Diet tray ordered 

## 2014-02-12 NOTE — ED Notes (Addendum)
Pt has been exposed to mother and aunt who have been sick with flu like symptoms. Has been taking reg meds, and nyquil last night.

## 2014-02-12 NOTE — ED Notes (Signed)
Attempted report X3 

## 2014-02-12 NOTE — ED Notes (Signed)
Pt O2 saturation drops to 85% when sleeping, Pt placed on 3L O2 nasal cannula, O2 saturation now 94%.

## 2014-02-12 NOTE — Discharge Instructions (Signed)
Metformin and X-ray Contrast Studies °For some X-ray exams, a contrast dye is used. Contrast dye is a type of medicine used to make the X-ray image clearer. The contrast dye is given to the patient through a vein (intravenously). If you need to have this type of X-ray exam and you take a medication called metformin, your caregiver may have you stop taking metformin before the exam.  °LACTIC ACIDOSIS °In rare cases, a serious medical condition called lactic acidosis can develop in people who take metformin and receive contrast dye. The following conditions can increase the risk of this complication:  °· Kidney failure. °· Liver problems. °· Certain types of heart problems such as: °¨ Heart failure. °¨ Heart attack. °¨ Heart infection. °¨ Heart valve problems. °· Alcohol abuse. °If left untreated, lactic acidosis can lead to coma.  °SYMPTOMS OF LACTIC ACIDOSIS °Symptoms of lactic acidosis can include: °· Rapid breathing (hyperventilation). °· Neurologic symptoms such as: °¨ Headaches. °¨ Confusion. °¨ Dizziness. °· Excessive sweating. °· Feeling sick to your stomach (nauseous) or throwing up (vomiting). °AFTER THE X-RAY EXAM °· Stay well-hydrated. Drink fluids as instructed by your caregiver. °· If you have a risk of developing lactic acidosis, blood tests may be done to make sure your kidney function is okay. °· Metformin is usually stopped for 48 hours after the X-ray exam. Ask your caregiver when you can start taking metformin again. °SEEK MEDICAL CARE IF:  °· You have shortness of breath or difficulty breathing. °· You develop a headache that does not go away. °· You have nausea or vomiting. °· You urinate more than normal. °· You develop a skin rash and have: °¨ Redness. °¨ Swelling. °¨ Itching. °Document Released: 01/12/2009 Document Revised: 04/18/2011 Document Reviewed: 01/12/2009 °ExitCare® Patient Information ©2015 [employer]. This information is not intended to replace advice given to you by your health  care provider. Make sure you discuss any questions you have with your health care provider. ° °

## 2014-02-13 DIAGNOSIS — E119 Type 2 diabetes mellitus without complications: Secondary | ICD-10-CM

## 2014-02-13 DIAGNOSIS — I517 Cardiomegaly: Secondary | ICD-10-CM

## 2014-02-13 LAB — URINE MICROSCOPIC-ADD ON

## 2014-02-13 LAB — RAPID URINE DRUG SCREEN, HOSP PERFORMED
AMPHETAMINES: NOT DETECTED
Barbiturates: NOT DETECTED
Benzodiazepines: NOT DETECTED
Cocaine: NOT DETECTED
Opiates: NOT DETECTED
Tetrahydrocannabinol: NOT DETECTED

## 2014-02-13 LAB — URINALYSIS, ROUTINE W REFLEX MICROSCOPIC
Bilirubin Urine: NEGATIVE
Glucose, UA: >1000 mg/dL — AB
HGB URINE DIPSTICK: NEGATIVE
Ketones, ur: 15 mg/dL — AB
LEUKOCYTES UA: NEGATIVE
Nitrite: NEGATIVE
Protein, ur: 30 mg/dL — AB
Specific Gravity, Urine: 1.04 — ABNORMAL HIGH (ref 1.005–1.030)
UROBILINOGEN UA: 0.2 mg/dL (ref 0.0–1.0)
pH: 6 (ref 5.0–8.0)

## 2014-02-13 LAB — BASIC METABOLIC PANEL
ANION GAP: 8 (ref 5–15)
BUN: 13 mg/dL (ref 6–23)
CO2: 28 mmol/L (ref 19–32)
Calcium: 8.6 mg/dL (ref 8.4–10.5)
Chloride: 100 mEq/L (ref 96–112)
Creatinine, Ser: 1.47 mg/dL — ABNORMAL HIGH (ref 0.50–1.35)
GFR calc Af Amer: 63 mL/min — ABNORMAL LOW (ref >90)
GFR calc non Af Amer: 55 mL/min — ABNORMAL LOW (ref >90)
Glucose, Bld: 276 mg/dL — ABNORMAL HIGH (ref 70–99)
Potassium: 4.6 mmol/L (ref 3.5–5.1)
SODIUM: 136 mmol/L (ref 135–145)

## 2014-02-13 LAB — HEMOGLOBIN A1C
Hgb A1c MFr Bld: 10.4 % — ABNORMAL HIGH (ref <5.7)
Mean Plasma Glucose: 252 mg/dL — ABNORMAL HIGH (ref <117)

## 2014-02-13 LAB — CBC
HEMATOCRIT: 41.9 % (ref 39.0–52.0)
HEMOGLOBIN: 13.8 g/dL (ref 13.0–17.0)
MCH: 29.5 pg (ref 26.0–34.0)
MCHC: 32.9 g/dL (ref 30.0–36.0)
MCV: 89.5 fL (ref 78.0–100.0)
Platelets: 275 10*3/uL (ref 150–400)
RBC: 4.68 MIL/uL (ref 4.22–5.81)
RDW: 13.3 % (ref 11.5–15.5)
WBC: 6.2 10*3/uL (ref 4.0–10.5)

## 2014-02-13 LAB — GLUCOSE, CAPILLARY
GLUCOSE-CAPILLARY: 247 mg/dL — AB (ref 70–99)
Glucose-Capillary: 289 mg/dL — ABNORMAL HIGH (ref 70–99)
Glucose-Capillary: 285 mg/dL — ABNORMAL HIGH (ref 70–99)
Glucose-Capillary: 183 mg/dL — ABNORMAL HIGH (ref 70–99)

## 2014-02-13 LAB — MRSA PCR SCREENING: MRSA by PCR: NEGATIVE

## 2014-02-13 LAB — TSH: TSH: 3.238 u[IU]/mL (ref 0.350–4.500)

## 2014-02-13 NOTE — Progress Notes (Signed)
  Echocardiogram 2D Echocardiogram has been performed.  Diamond Nickel 02/13/2014, 9:41 AM

## 2014-02-13 NOTE — Progress Notes (Signed)
Inpatient Diabetes Program Recommendations  AACE/ADA: New Consensus Statement on Inpatient Glycemic Control (2013)  Target Ranges:  Prepandial:   less than 140 mg/dL      Peak postprandial:   less than 180 mg/dL (1-2 hours)      Critically ill patients:  140 - 180 mg/dL   Results for Jonathan Austin, Jonathan Austin (MRN [id number]) as of 02/13/2014 09:25  Ref. Range 02/12/2014 08:51 02/12/2014 10:32 02/12/2014 13:33 02/12/2014 16:17 02/12/2014 21:49 02/13/2014 07:36  Glucose-Capillary Latest Range: 70-99 mg/dL 299 (H) 306 (H) 296 (H) 229 (H) 398 (H) 289 (H)   Diabetes history: DM2 Outpatient Diabetes medications: Levemir 20 units BID, Novolog 4-12 units BID with meals, Glipizide 5 mg QAM, Metformin 500 mg daily Current orders for Inpatient glycemic control: Levemir 20 units QHS, Novolog 0-9 units AC, Novolog 0-5 units QHS  Inpatient Diabetes Program Recommendations Insulin - Basal: Fasting glucose 289 mg/dl this morning. Patient takes Levemir 20 units BID as an outpatient. Please consider increasing Levemir closer to outpatient dose. A1C: Last A1C was 10.6% on 06/18/2013. Please consider ordering an A1C to evaluate glycemic control over the past 2-3 months.  Thanks, Barnie Alderman, RN, MSN, CCRN, CDE Diabetes Coordinator Inpatient Diabetes Program [phone] (Team Pager) [phone] ([facility] office) [phone] [facility] office)

## 2014-02-13 NOTE — Progress Notes (Signed)
Patient stated that he would like to place himself on CPAP.

## 2014-02-13 NOTE — Progress Notes (Addendum)
[facility] PROGRESS NOTE  Jonathan Austin NOI:[id number] DOB: 1965/05/09 DOA: 02/12/2014 PCP: Pcp Not In System  Assessment/Plan: 1-Acute Hypoxic respiratory failure:  -viral panel pending, influenza: negative.  -CT angio; negative for PE, showed pleural nodule. Patient aware need follow up.  -ECHO;normal..  -HIV pending. -check oxygen on ambulation.   2-Fever; Likely related to viral illness.  Follow blood culture, urine culture.   Diabetes:  HB-A1c pending. Hold metformin.   Sinus tachycardia;resolved.   HTN;hold ACE.  Acute renal insufficiency; continue with IV fluids. Repeat labs in am.   History of cocaine use: UDS negative  Code Status: Full Code.  Family Communication: care discussed with patient.  Disposition Plan: home 1-08   Consultants:  none  Procedures:  none  Antibiotics:  none  HPI/Subjective: He is feeling better today, breathing better.   Objective: Filed Vitals:   02/13/14 0429  BP: 104/61  Pulse: 87  Temp: 97.8 F (36.6 C)  Resp: 18    Intake/Output Summary (Last 24 hours) at 02/13/14 0849 Last data filed at 02/12/14 1235  Gross per 24 hour  Intake   2000 ml  Output      0 ml  Net   2000 ml   Filed Weights   02/12/14 1851  Weight: 103.057 kg (227 lb 3.2 oz)    Exam:   General:  Alert in no distress.   Cardiovascular: S 1, S 2 RRR  Respiratory: CTA  Abdomen: BS present, soft, NT  Musculoskeletal: no edema.   Data Reviewed: Basic Metabolic Panel:  Recent Labs Lab 02/12/14 0844 02/13/14 0618  NA 135 136  K 4.2 4.6  CL 97 100  CO2 27 28  GLUCOSE 302* 276*  BUN 8 13  CREATININE 1.26 1.47*  CALCIUM 9.0 8.6   Liver Function Tests:  Recent Labs Lab 02/12/14 0844  AST 18  ALT 22  ALKPHOS 57  BILITOT 0.8  PROT 7.0  ALBUMIN 3.6   No results for input(s): LIPASE, AMYLASE in the last 168 hours. No results for input(s): AMMONIA in the last 168 hours. CBC:  Recent Labs Lab 02/12/14 0844  02/13/14 0618  WBC 9.6 6.2  NEUTROABS 6.5  --   HGB 16.0 13.8  HCT 46.9 41.9  MCV 87.8 89.5  PLT 313 275   Cardiac Enzymes: No results for input(s): CKTOTAL, CKMB, CKMBINDEX, TROPONINI in the last 168 hours. BNP (last 3 results) No results for input(s): PROBNP in the last 8760 hours. CBG:  Recent Labs Lab 02/12/14 1032 02/12/14 1333 02/12/14 1617 02/12/14 2149 02/13/14 0736  GLUCAP 306* 296* 229* 398* 289*    Recent Results (from the past 240 hour(s))  Culture, blood (routine x 2)     Status: None (Preliminary result)   Collection Time: 02/12/14  9:25 AM  Result Value Ref Range Status   Specimen Description BLOOD ARM LEFT  Final   Special Requests BOTTLES DRAWN AEROBIC AND ANAEROBIC 5CC  Final   Culture   Final           BLOOD CULTURE RECEIVED NO GROWTH TO DATE CULTURE WILL BE HELD FOR 5 DAYS BEFORE ISSUING A FINAL NEGATIVE REPORT Performed at [facility]    Report Status PENDING  Incomplete     Studies: Dg Chest 2 View  02/12/2014   CLINICAL DATA:  Cough and fever.  EXAM: CHEST  2 VIEW  COMPARISON:  CT 12/09/2012.  Chest x-ray 12/09/2012 peer  FINDINGS: Mediastinum and hilar structures are normal. Previously identified right base  infiltrate has partially cleared. Heart size normal. No pleural effusion or pneumothorax. No acute bony abnormality .  IMPRESSION: Previously identified right base pulmonary infiltrate has partially cleared.   Electronically Signed   By: Marcello Moores  Register   On: 02/12/2014 10:22   Ct Angio Chest Pe W/cm &/or Wo Cm  02/12/2014   CLINICAL DATA:  Cough and congestion  EXAM: CT ANGIOGRAPHY CHEST WITH CONTRAST  TECHNIQUE: Multidetector CT imaging of the chest was performed using the standard protocol during bolus administration of intravenous contrast. Multiplanar CT image reconstructions and MIPs were obtained to evaluate the vascular anatomy.  CONTRAST:  54mL OMNIPAQUE IOHEXOL 350 MG/ML SOLN  COMPARISON:  Recent plain film examination from the  same day.  FINDINGS: The lungs are well aerated bilaterally. No focal parenchymal infiltrate is seen. A few small subpleural nodules are identified. The largest of these is on the left in the lower lobe laterally. It measures approximately 3 mm in greatest dimension.  The thoracic inlet is within normal limits. The thoracic aorta is within normal limits. Pulmonary artery is well visualized bilaterally without evidence of filling defect to suggest pulmonary embolism. No significant hilar or mediastinal adenopathy is seen. A few small scattered mediastinal lymph nodes are noted. Mild coronary calcifications are seen.  The upper abdomen reveals hypodense lesion involving the right adrenal gland measuring approximately 3.1 cm. This likely represents an adenoma or myelolipoma. It is stable in appearance but approximately 1 cm larger than that seen on the prior exam from 12/09/2012. The bony structures are within normal limits.  Review of the MIP images confirms the above findings.  IMPRESSION: No evidence of pulmonary embolism.  Subpleural nodule measuring 3 mm in greatest dimension. It is stable from 2014 in considered to be benign.  Hypodense lesion involving the right adrenal gland. The appearance of the lesion is stable although slightly enlarged from the prior exam (2014).   Electronically Signed   By: Inez Catalina M.D.   On: 02/12/2014 16:45    Scheduled Meds: . enoxaparin (LOVENOX) injection  40 mg Subcutaneous QHS  . insulin aspart  0-5 Units Subcutaneous QHS  . insulin aspart  0-9 Units Subcutaneous TID WC  . insulin detemir  20 Units Subcutaneous QHS  . sodium chloride  3 mL Intravenous Q12H   Continuous Infusions: . sodium chloride 100 mL/hr at 02/12/14 2049    Active Problems:   Diabetes mellitus, type 2   Acute respiratory failure   Fever   Sinus tachycardia    Time spent: 35 minutes.     Niel Hummer A  [facility] Pager [phone]. If 7PM-7AM, please contact  night-coverage at [email or web address], password [facility] 02/13/2014, 8:49 AM  LOS: 1 day

## 2014-02-14 LAB — RESPIRATORY VIRUS PANEL
ADENOVIRUS: NOT DETECTED
INFLUENZA A H3: NOT DETECTED
Influenza A H1: NOT DETECTED
Influenza A: NOT DETECTED
Influenza B: NOT DETECTED
METAPNEUMOVIRUS: NOT DETECTED
PARAINFLUENZA 2 A: NOT DETECTED
PARAINFLUENZA 3 A: NOT DETECTED
Parainfluenza 1: NOT DETECTED
RESPIRATORY SYNCYTIAL VIRUS B: NOT DETECTED
Respiratory Syncytial Virus A: DETECTED — AB
Rhinovirus: NOT DETECTED

## 2014-02-14 LAB — BASIC METABOLIC PANEL
Anion gap: 3 — ABNORMAL LOW (ref 5–15)
BUN: 11 mg/dL (ref 6–23)
CO2: 30 mmol/L (ref 19–32)
Calcium: 8.4 mg/dL (ref 8.4–10.5)
Chloride: 106 mEq/L (ref 96–112)
Creatinine, Ser: 1.2 mg/dL (ref 0.50–1.35)
GFR calc non Af Amer: 70 mL/min — ABNORMAL LOW (ref >90)
GFR, EST AFRICAN AMERICAN: 81 mL/min — AB (ref >90)
Glucose, Bld: 145 mg/dL — ABNORMAL HIGH (ref 70–99)
Potassium: 3.8 mmol/L (ref 3.5–5.1)
Sodium: 139 mmol/L (ref 135–145)

## 2014-02-14 MED ORDER — INSULIN DETEMIR 100 UNIT/ML ~~LOC~~ SOLN: 20.0000 [IU] | Freq: Every day | SUBCUTANEOUS | Status: DC

## 2014-02-14 NOTE — Discharge Summary (Signed)
Physician Discharge Summary  Jonathan Austin WNU:[id number] DOB: Jul 27, 1965 DOA: 02/12/2014  PCP: Pcp Not In System  Admit date: 02/12/2014 Discharge date: 02/14/2014  Time spent: 35 minutes  Recommendations for Outpatient Follow-up:  1. Needs further adjustment on diabetes medications.  2. Needs repeat B-met to follow renal function.  3. Please follow up result HIV test, pending at time of discharge. Urine culture.   Discharge Diagnoses:     Diabetes mellitus, type 2   Acute respiratory failure   Fever   Sinus tachycardia   Discharge Condition: Stable.   Diet recommendation: Carb modified.   Filed Weights   02/12/14 1851 02/13/14 1958  Weight: 103.057 kg (227 lb 3.2 oz) 106.595 kg (235 lb)    History of present illness:  49 year old male with a history of diabetes mellitus, hypertension, and sleep apnea presents with 3 day history of whole body myalgias and arthralgias, sore throat, some dyspnea on exertion, productive cough with yellow sputum, and generalized weakness. The patient states that his mother and aunt also had a same illness around Christmas time. The patient did not take his temperature at home, but had subjective fevers, chills, and rigors. The patient denies any chest discomfort, hemoptysis, nausea, vomiting, diarrhea, dysuria, hematuria, hematochezia, melena. The patient does have some abdominal pain particularly when he coughs that wraps around his upper abdomen. The patient had a bowel movement this morning and is passing flatus. The patient has not been on antibiotics in the outpatient setting. He denies any rashes or synovitis. The patient denies any history of tobacco use, and he has not had any exposure to secondhand smoke.  In the emergency department, the patient was noted to have tachycardia up to 134 which improves slightly into the upper 110s and low 120s after 2 L of fluid. He was noted to have a temperature 100.95F. The patient was noted to be hypoxemic. When  he was ambulated, he desaturated to 88%. While he was sleeping, he desaturated to 86%. He was placed on 3 L nasal cannula with oxygen saturation of 95-96 percent. Influenza PCR was obtained in the emergency department. Chest x-ray was negative for any consolidations. EKG shows sinus tachycardia without any ST changes.  Hospital Course:  1-Acute Hypoxic respiratory failure: in setting likely viral respiratory infection, bronchitis. Resolved.  -viral panel pending, influenza: negative.  -CT angio; negative for PE, showed pleural nodule. Patient aware need follow up.  -ECHO;normal..  -HIV screening test pending. -oxygen on ambulation 96 %  2-Fever; Likely related to viral illness.  blood culture no growth to date, urine culture pending  Patient has remain afebrile.   Diabetes:  HB-A1c 10.  Advised patient to resume metformin 1-09. He was not taking levemir twice a day. Will continue with levemir 20 units daily. He will continue with glipizide and SSI. Advised to work on diet. He need close follow up with PCP>    Sinus tachycardia;resolved.   HTN;hold ACE. SBP in the 100 range.   Acute renal insufficiency; continue with IV fluids. Repeat labs in am.   History of cocaine use: UDS negative  Procedures:  none  Consultations:  none  Discharge Exam: Filed Vitals:   02/14/14 0517  BP: 116/77  Pulse: 88  Temp: 97.5 F (36.4 C)  Resp: 19    General: no distress.  Cardiovascular: S 1, S 2 RRR Respiratory: CTA  Discharge Instructions   Discharge Instructions    Diet Carb Modified    Complete by:  As directed  Increase activity slowly    Complete by:  As directed           Current Discharge Medication List    CONTINUE these medications which have CHANGED   Details  insulin detemir (LEVEMIR) 100 UNIT/ML injection Inject 0.2 mLs (20 Units total) into the skin daily. Qty: 10 mL, Refills: 0      CONTINUE these medications which have NOT CHANGED   Details   acetaminophen (TYLENOL) 500 MG tablet Take 1,000 mg by mouth every 6 (six) hours as needed for mild pain.     glipiZIDE (GLUCOTROL XL) 5 MG 24 hr tablet Take 5 mg by mouth daily with breakfast.    insulin aspart (NOVOLOG) 100 UNIT/ML injection Inject 4-12 Units into the skin 2 (two) times daily before a meal. Sliding scale    metFORMIN (GLUCOPHAGE) 500 MG tablet Take 500 mg by mouth daily.     oxyCODONE (OXY IR/ROXICODONE) 5 MG immediate release tablet Take 1 tablet (5 mg total) by mouth every 6 (six) hours as needed for moderate pain. Qty: 30 tablet, Refills: 0      STOP taking these medications     lisinopril (PRINIVIL,ZESTRIL) 10 MG tablet      doxycycline (VIBRAMYCIN) 100 MG capsule        No Known Allergies Follow-up Information    Follow up with Pcp Not In System.   Why:  Please follow with PCP in 1 week.        The results of significant diagnostics from this hospitalization (including imaging, microbiology, ancillary and laboratory) are listed below for reference.    Significant Diagnostic Studies: Dg Chest 2 View  02/12/2014   CLINICAL DATA:  Cough and fever.  EXAM: CHEST  2 VIEW  COMPARISON:  CT 12/09/2012.  Chest x-ray 12/09/2012 peer  FINDINGS: Mediastinum and hilar structures are normal. Previously identified right base infiltrate has partially cleared. Heart size normal. No pleural effusion or pneumothorax. No acute bony abnormality .  IMPRESSION: Previously identified right base pulmonary infiltrate has partially cleared.   Electronically Signed   By: Marcello Moores  Register   On: 02/12/2014 10:22   Ct Angio Chest Pe W/cm &/or Wo Cm  02/12/2014   CLINICAL DATA:  Cough and congestion  EXAM: CT ANGIOGRAPHY CHEST WITH CONTRAST  TECHNIQUE: Multidetector CT imaging of the chest was performed using the standard protocol during bolus administration of intravenous contrast. Multiplanar CT image reconstructions and MIPs were obtained to evaluate the vascular anatomy.  CONTRAST:   19m OMNIPAQUE IOHEXOL 350 MG/ML SOLN  COMPARISON:  Recent plain film examination from the same day.  FINDINGS: The lungs are well aerated bilaterally. No focal parenchymal infiltrate is seen. A few small subpleural nodules are identified. The largest of these is on the left in the lower lobe laterally. It measures approximately 3 mm in greatest dimension.  The thoracic inlet is within normal limits. The thoracic aorta is within normal limits. Pulmonary artery is well visualized bilaterally without evidence of filling defect to suggest pulmonary embolism. No significant hilar or mediastinal adenopathy is seen. A few small scattered mediastinal lymph nodes are noted. Mild coronary calcifications are seen.  The upper abdomen reveals hypodense lesion involving the right adrenal gland measuring approximately 3.1 cm. This likely represents an adenoma or myelolipoma. It is stable in appearance but approximately 1 cm larger than that seen on the prior exam from 12/09/2012. The bony structures are within normal limits.  Review of the MIP images confirms the above findings.  IMPRESSION: No evidence of pulmonary embolism.  Subpleural nodule measuring 3 mm in greatest dimension. It is stable from 2014 in considered to be benign.  Hypodense lesion involving the right adrenal gland. The appearance of the lesion is stable although slightly enlarged from the prior exam (2014).   Electronically Signed   By: Inez Catalina M.D.   On: 02/12/2014 16:45    Microbiology: Recent Results (from the past 240 hour(s))  Culture, blood (routine x 2)     Status: None (Preliminary result)   Collection Time: 02/12/14  9:25 AM  Result Value Ref Range Status   Specimen Description BLOOD ARM LEFT  Final   Special Requests BOTTLES DRAWN AEROBIC AND ANAEROBIC 5CC  Final   Culture   Final           BLOOD CULTURE RECEIVED NO GROWTH TO DATE CULTURE WILL BE HELD FOR 5 DAYS BEFORE ISSUING A FINAL NEGATIVE REPORT Performed at [facility]    Report Status PENDING  Incomplete  Culture, blood (routine x 2)     Status: None (Preliminary result)   Collection Time: 02/12/14  5:11 PM  Result Value Ref Range Status   Specimen Description BLOOD RIGHT ARM  Final   Special Requests BOTTLES DRAWN AEROBIC AND ANAEROBIC 5CC  Final   Culture   Final           BLOOD CULTURE RECEIVED NO GROWTH TO DATE CULTURE WILL BE HELD FOR 5 DAYS BEFORE ISSUING A FINAL NEGATIVE REPORT Performed at [facility]    Report Status PENDING  Incomplete  MRSA PCR Screening     Status: None   Collection Time: 02/13/14 12:45 PM  Result Value Ref Range Status   MRSA by PCR NEGATIVE NEGATIVE Final    Comment:        The GeneXpert MRSA Assay (FDA approved for NASAL specimens only), is one component of a comprehensive MRSA colonization surveillance program. It is not intended to diagnose MRSA infection nor to guide or monitor treatment for MRSA infections.      Labs: Basic Metabolic Panel:  Recent Labs Lab 02/12/14 0844 02/13/14 0618 02/14/14 0604  NA 135 136 139  K 4.2 4.6 3.8  CL 97 100 106  CO2 _0 GLUCOSE 302* 276* 145*  BUN _1 CREATININE 1.26 1.47* 1.20  CALCIUM 9.0 8.6 8.4   Liver Function Tests:  Recent Labs Lab 02/12/14 0844  AST 18  ALT 22  ALKPHOS 57  BILITOT 0.8  PROT 7.0  ALBUMIN 3.6   No results for input(s): LIPASE, AMYLASE in the last 168 hours. No results for input(s): AMMONIA in the last 168 hours. CBC:  Recent Labs Lab 02/12/14 0844 02/13/14 0618  WBC 9.6 6.2  NEUTROABS 6.5  --   HGB 16.0 13.8  HCT 46.9 41.9  MCV 87.8 89.5  PLT 313 275   Cardiac Enzymes: No results for input(s): CKTOTAL, CKMB, CKMBINDEX, TROPONINI in the last 168 hours. BNP: BNP (last 3 results) No results for input(s): PROBNP in the last 8760 hours. CBG:  Recent Labs Lab 02/12/14 2149 02/13/14 0736 02/13/14 1141 02/13/14 1653 02/13/14 2324  GLUCAP 398* 289* 285* 247* 183*        Signed:  Davell Beckstead A  [facility] 02/14/2014, 8:40 AM

## 2014-02-15 LAB — URINE CULTURE: Colony Count: 70000

## 2014-02-17 LAB — GLUCOSE, CAPILLARY
GLUCOSE-CAPILLARY: 147 mg/dL — AB (ref 70–99)
Glucose-Capillary: 247 mg/dL — ABNORMAL HIGH (ref 70–99)
Glucose-Capillary: 276 mg/dL — ABNORMAL HIGH (ref 70–99)

## 2014-02-18 LAB — CULTURE, BLOOD (ROUTINE X 2): Culture: NO GROWTH

## 2014-02-18 LAB — HIV ANTIBODY (ROUTINE TESTING W REFLEX)
HIV 1/HIV 2 AB: NONREACTIVE
HIV 1/O/2 Abs-Index Value: <1 (ref <1.00)

## 2014-02-19 LAB — CULTURE, BLOOD (ROUTINE X 2): Culture: NO GROWTH

## 2014-04-13 ENCOUNTER — Encounter (HOSPITAL_COMMUNITY): Payer: Self-pay | Admitting: Family Medicine

## 2014-04-13 ENCOUNTER — Emergency Department (HOSPITAL_COMMUNITY): Payer: 59

## 2014-04-13 ENCOUNTER — Emergency Department (HOSPITAL_COMMUNITY)
Admission: EM | Admit: 2014-04-13 | Discharge: 2014-04-15 | Disposition: A | Discharge status: Other Acute Inpatient Hospital | Payer: 59 | Attending: Emergency Medicine | Admitting: Emergency Medicine

## 2014-04-13 DIAGNOSIS — Z8669 Personal history of other diseases of the nervous system and sense organs: Secondary | ICD-10-CM | POA: Insufficient documentation

## 2014-04-13 DIAGNOSIS — R45851 Suicidal ideations: Secondary | ICD-10-CM

## 2014-04-13 DIAGNOSIS — F329 Major depressive disorder, single episode, unspecified: Secondary | ICD-10-CM | POA: Diagnosis not present

## 2014-04-13 DIAGNOSIS — R05 Cough: Secondary | ICD-10-CM

## 2014-04-13 DIAGNOSIS — Z794 Long term (current) use of insulin: Secondary | ICD-10-CM | POA: Insufficient documentation

## 2014-04-13 DIAGNOSIS — Z7951 Long term (current) use of inhaled steroids: Secondary | ICD-10-CM | POA: Diagnosis not present

## 2014-04-13 DIAGNOSIS — Z720 Tobacco use: Secondary | ICD-10-CM | POA: Diagnosis not present

## 2014-04-13 DIAGNOSIS — E119 Type 2 diabetes mellitus without complications: Secondary | ICD-10-CM | POA: Insufficient documentation

## 2014-04-13 DIAGNOSIS — F1010 Alcohol abuse, uncomplicated: Secondary | ICD-10-CM

## 2014-04-13 DIAGNOSIS — F1410 Cocaine abuse, uncomplicated: Secondary | ICD-10-CM | POA: Diagnosis not present

## 2014-04-13 DIAGNOSIS — F32A Depression, unspecified: Secondary | ICD-10-CM

## 2014-04-13 DIAGNOSIS — R059 Cough, unspecified: Secondary | ICD-10-CM

## 2014-04-13 DIAGNOSIS — Z79899 Other long term (current) drug therapy: Secondary | ICD-10-CM | POA: Diagnosis not present

## 2014-04-13 LAB — CBC
HCT: 44.5 % (ref 39.0–52.0)
Hemoglobin: 15.3 g/dL (ref 13.0–17.0)
MCH: 30.5 pg (ref 26.0–34.0)
MCHC: 34.4 g/dL (ref 30.0–36.0)
MCV: 88.8 fL (ref 78.0–100.0)
PLATELETS: 324 10*3/uL (ref 150–400)
RBC: 5.01 MIL/uL (ref 4.22–5.81)
RDW: 13.2 % (ref 11.5–15.5)
WBC: 7.2 10*3/uL (ref 4.0–10.5)

## 2014-04-13 LAB — SALICYLATE LEVEL

## 2014-04-13 LAB — COMPREHENSIVE METABOLIC PANEL
ALK PHOS: 64 U/L (ref 39–117)
ALT: 28 U/L (ref 0–53)
AST: 26 U/L (ref 0–37)
Albumin: 3.8 g/dL (ref 3.5–5.2)
Anion gap: 11 (ref 5–15)
BUN: 9 mg/dL (ref 6–23)
CALCIUM: 9 mg/dL (ref 8.4–10.5)
CO2: 28 mmol/L (ref 19–32)
Chloride: 93 mmol/L — ABNORMAL LOW (ref 96–112)
Creatinine, Ser: 1.27 mg/dL (ref 0.50–1.35)
GFR calc Af Amer: 76 mL/min — ABNORMAL LOW (ref >90)
GFR, EST NON AFRICAN AMERICAN: 65 mL/min — AB (ref >90)
GLUCOSE: 347 mg/dL — AB (ref 70–99)
POTASSIUM: 4.1 mmol/L (ref 3.5–5.1)
SODIUM: 132 mmol/L — AB (ref 135–145)
Total Bilirubin: 0.9 mg/dL (ref 0.3–1.2)
Total Protein: 7.2 g/dL (ref 6.0–8.3)

## 2014-04-13 LAB — CBG MONITORING, ED: Glucose-Capillary: 246 mg/dL — ABNORMAL HIGH (ref 70–99)

## 2014-04-13 LAB — RAPID URINE DRUG SCREEN, HOSP PERFORMED
Amphetamines: NOT DETECTED
Barbiturates: NOT DETECTED
Benzodiazepines: NOT DETECTED
Cocaine: POSITIVE — AB
Opiates: NOT DETECTED
Tetrahydrocannabinol: NOT DETECTED

## 2014-04-13 LAB — ACETAMINOPHEN LEVEL

## 2014-04-13 LAB — ETHANOL: Alcohol, Ethyl (B): <5 mg/dL (ref 0–9)

## 2014-04-13 MED ORDER — GLIPIZIDE 10 MG PO TABS
10.0000 mg | ORAL_TABLET | Freq: Every day | ORAL | Status: DC
  Administered 2014-04-14: 10 mg via ORAL
  Filled 2014-04-13 (×3): qty 1

## 2014-04-13 MED ORDER — FLUTICASONE PROPIONATE 50 MCG/ACT NA SUSP
1.0000 | Freq: Every day | NASAL | Status: DC
  Administered 2014-04-14: 1 via NASAL
  Filled 2014-04-13 (×2): qty 16

## 2014-04-13 MED ORDER — ONDANSETRON HCL 4 MG PO TABS: 4.0000 mg | ORAL_TABLET | Freq: Three times a day (TID) | ORAL | Status: DC | PRN

## 2014-04-13 MED ORDER — ACETAMINOPHEN 500 MG PO TABS: 1000.0000 mg | ORAL_TABLET | Freq: Four times a day (QID) | ORAL | Status: DC | PRN

## 2014-04-13 MED ORDER — LORATADINE 10 MG PO TABS
10.0000 mg | ORAL_TABLET | Freq: Every day | ORAL | Status: DC
  Administered 2014-04-13 – 2014-04-14 (×2): 10 mg via ORAL
  Filled 2014-04-13 (×5): qty 1

## 2014-04-13 MED ORDER — IBUPROFEN 400 MG PO TABS: 600.0000 mg | ORAL_TABLET | Freq: Three times a day (TID) | ORAL | Status: DC | PRN

## 2014-04-13 MED ORDER — LISINOPRIL 10 MG PO TABS
10.0000 mg | ORAL_TABLET | Freq: Every day | ORAL | Status: DC
  Administered 2014-04-13: 10 mg via ORAL
  Filled 2014-04-13 (×2): qty 1

## 2014-04-13 MED ORDER — ALUM & MAG HYDROXIDE-SIMETH 200-200-20 MG/5ML PO SUSP: 30.0000 mL | ORAL | Status: DC | PRN

## 2014-04-13 MED ORDER — LORAZEPAM 1 MG PO TABS: 0.0000 mg | ORAL_TABLET | Freq: Two times a day (BID) | ORAL | Status: DC

## 2014-04-13 MED ORDER — METFORMIN HCL 500 MG PO TABS
500.0000 mg | ORAL_TABLET | Freq: Every day | ORAL | Status: DC
  Administered 2014-04-13 – 2014-04-14 (×2): 500 mg via ORAL
  Filled 2014-04-13 (×2): qty 1

## 2014-04-13 MED ORDER — ALBUTEROL SULFATE HFA 108 (90 BASE) MCG/ACT IN AERS
1.0000 | INHALATION_SPRAY | Freq: Four times a day (QID) | RESPIRATORY_TRACT | Status: DC | PRN
  Administered 2014-04-14: 2 via RESPIRATORY_TRACT
  Filled 2014-04-13: qty 6.7

## 2014-04-13 MED ORDER — INSULIN DETEMIR 100 UNIT/ML ~~LOC~~ SOLN
20.0000 [IU] | Freq: Every day | SUBCUTANEOUS | Status: DC
  Administered 2014-04-13 – 2014-04-14 (×2): 20 [IU] via SUBCUTANEOUS
  Filled 2014-04-13 (×3): qty 0.2

## 2014-04-13 MED ORDER — OXYCODONE HCL 5 MG PO TABS: 5.0000 mg | ORAL_TABLET | Freq: Four times a day (QID) | ORAL | Status: DC | PRN

## 2014-04-13 MED ORDER — NICOTINE 21 MG/24HR TD PT24
21.0000 mg | MEDICATED_PATCH | Freq: Every day | TRANSDERMAL | Status: DC
  Administered 2014-04-14: 21 mg via TRANSDERMAL
  Filled 2014-04-13: qty 1

## 2014-04-13 MED ORDER — INSULIN ASPART 100 UNIT/ML ~~LOC~~ SOLN
4.0000 [IU] | Freq: Two times a day (BID) | SUBCUTANEOUS | Status: DC
  Administered 2014-04-13: 6 [IU] via SUBCUTANEOUS
  Administered 2014-04-14: 4 [IU] via SUBCUTANEOUS
  Administered 2014-04-14: 6 [IU] via SUBCUTANEOUS
  Filled 2014-04-13 (×3): qty 1

## 2014-04-13 MED ORDER — ACETAMINOPHEN 325 MG PO TABS
650.0000 mg | ORAL_TABLET | ORAL | Status: DC | PRN
  Administered 2014-04-13: 650 mg via ORAL
  Filled 2014-04-13: qty 2

## 2014-04-13 MED ORDER — THIAMINE HCL 100 MG/ML IJ SOLN
100.0000 mg | Freq: Every day | INTRAMUSCULAR | Status: DC
  Administered 2014-04-13: 100 mg via INTRAVENOUS
  Filled 2014-04-13: qty 2

## 2014-04-13 MED ORDER — ZOLPIDEM TARTRATE 5 MG PO TABS: 5.0000 mg | ORAL_TABLET | Freq: Every evening | ORAL | Status: DC | PRN

## 2014-04-13 MED ORDER — VITAMIN B-1 100 MG PO TABS
100.0000 mg | ORAL_TABLET | Freq: Every day | ORAL | Status: DC
  Administered 2014-04-13 – 2014-04-14 (×2): 100 mg via ORAL
  Filled 2014-04-13 (×3): qty 1

## 2014-04-13 MED ORDER — LORAZEPAM 1 MG PO TABS: 0.0000 mg | ORAL_TABLET | Freq: Four times a day (QID) | ORAL | Status: DC

## 2014-04-13 NOTE — ED Notes (Signed)
Pt knows that urine is needed.

## 2014-04-13 NOTE — ED Notes (Signed)
CBG=278 

## 2014-04-13 NOTE — ED Notes (Signed)
Pt requesting CPAP so he can sleep, now. Respiratory notified.

## 2014-04-13 NOTE — ED Notes (Signed)
Pt up to phone

## 2014-04-13 NOTE — ED Notes (Signed)
Provider at the bedside.  

## 2014-04-13 NOTE — Progress Notes (Signed)
CSW continued inpatient placement for patient.  CSW contacted multiple facilities who reported they are at capacity.  Referrals was Faxed For Consideration: [place]: [facility] [place],  Disposition Social Worker [phone]

## 2014-04-13 NOTE — ED Provider Notes (Signed)
CSN: [id number]     Arrival date & time 04/13/14  6440 History   First MD Initiated Contact with Patient 04/13/14 [phone]     Chief Complaint  Patient presents with  . Suicidal     (Consider location/radiation/quality/duration/timing/severity/associated sxs/prior Treatment) HPI    PCP: Pcp Not In System Blood pressure 142/93, pulse 101, temperature 97.6 F (36.4 C), temperature source Oral, resp. rate 18, height 5\' 8"  (1.727 m), weight 235 lb (106.595 kg), SpO2 97 %.  Jonathan Austin is a 49 y.o.male with a significant PMH of diabetes mellitis and sleep apnea presents to the ER with complaints of suicidal ideation, depression, alcohol abuse and crack cocaine abuse requesting detox. He reports having substance abuse since he was 49 years old, he was clean for 15 years while in prison. When he was released 5 years ago he started to drink and use drugs again. He now is having suicidal ideation and yesterday attempting to harm himself by overdosing on alcohol and cocaine. He typically uses 1 gram a day and two x 40 oz of alcohol a day. Last night he used 3 grams and drank two x 40 oz as well as 2 bottles of wine. He denies having any chest pain or syncope. He does report cough and has been using decongestant without much improvement today. He currently is not in active withdrawal. He denies having withdrawal seizures but does have withdrawal symptoms.  Negative Review of Symptoms:  Denies HI, hallucinations, confusion, chest pain, SOB, back pain, fevers, rash, myalgias, denies any other substance abuse.  Past Medical History  Diagnosis Date  . Diabetes mellitus   . Sleep apnea    Past Surgical History  Procedure Laterality Date  . Tonsillectomy     Family History  Problem Relation Age of Onset  . Hypertension Mother   . Hypertension Father   . Diabetes Father    History  Substance Use Topics  . Smoking status: Current Some Day Smoker    Types: Cigars  . Smokeless tobacco: Not on file   . Alcohol Use: 1.8 oz/week    1 Glasses of wine, 1 Cans of beer, 1 Shots of liquor per week     Comment: Occasionally    Review of Systems  10 Systems reviewed and are negative for acute change except as noted in the HPI.     Allergies  Review of patient's allergies indicates no known allergies.  Home Medications   Prior to Admission medications   Medication Sig Start Date End Date Taking? Authorizing Provider  acetaminophen (TYLENOL) 500 MG tablet Take 1,000 mg by mouth every 6 (six) hours as needed for mild pain.    Yes Historical Provider, MD  albuterol (PROVENTIL HFA;VENTOLIN HFA) 108 (90 BASE) MCG/ACT inhaler Inhale 1-2 puffs into the lungs every 6 (six) hours as needed for wheezing or shortness of breath.   Yes Historical Provider, MD  fluticasone (FLONASE) 50 MCG/ACT nasal spray Place 1 spray into both nostrils daily.   Yes Historical Provider, MD  glipiZIDE (GLUCOTROL) 10 MG tablet Take 10 mg by mouth daily before breakfast.   Yes Historical Provider, MD  insulin aspart (NOVOLOG) 100 UNIT/ML injection Inject 4-12 Units into the skin 2 (two) times daily before a meal. Sliding scale   Yes Historical Provider, MD  insulin detemir (LEVEMIR) 100 UNIT/ML injection Inject 0.2 mLs (20 Units total) into the skin daily. Patient taking differently: Inject 40 Units into the skin daily.  02/14/14  Yes Elmarie Shiley, MD  lisinopril (PRINIVIL,ZESTRIL) 10 MG tablet Take 10 mg by mouth daily.   Yes Historical Provider, MD  loratadine (CLARITIN) 10 MG tablet Take 10 mg by mouth daily.   Yes Historical Provider, MD  metFORMIN (GLUCOPHAGE) 500 MG tablet Take 500 mg by mouth daily.    Yes Historical Provider, MD  oxyCODONE (OXY IR/ROXICODONE) 5 MG immediate release tablet Take 1 tablet (5 mg total) by mouth every 6 (six) hours as needed for moderate pain. Patient not taking: Reported on 02/12/2014 07/01/13   Jonetta Osgood, MD   BP 139/101 mmHg  Pulse 97  Temp(Src) 97.6 F (36.4 C) (Oral)   Resp 18  Ht 5\' 8"  (1.727 m)  Wt 235 lb (106.595 kg)  BMI 35.74 kg/m2  SpO2 98% Physical Exam  Constitutional: He appears well-developed and well-nourished. No distress.  HENT:  Head: Normocephalic and atraumatic.  Eyes: Pupils are equal, round, and reactive to light.  Neck: Normal range of motion. Neck supple.  Cardiovascular: Normal rate and regular rhythm.   Pulmonary/Chest: Effort normal.  Abdominal: Soft.  Neurological: He is alert.  Skin: Skin is warm and dry.  Psychiatric: His speech is normal and behavior is normal. Judgment normal. Cognition and memory are normal. He exhibits a depressed mood. He expresses suicidal ideation. He expresses no homicidal ideation. He expresses suicidal plans. He expresses no homicidal plans.  +tearful  Nursing note and vitals reviewed.   ED Course  Procedures (including critical care time) Labs Review Labs Reviewed  COMPREHENSIVE METABOLIC PANEL - Abnormal; Notable for the following:    Sodium 132 (*)    Chloride 93 (*)    Glucose, Bld 347 (*)    GFR calc non Af Amer 65 (*)    GFR calc Af Amer 76 (*)    All other components within normal limits  ACETAMINOPHEN LEVEL - Abnormal; Notable for the following:    Acetaminophen (Tylenol), Serum <10.0 (*)    All other components within normal limits  URINE RAPID DRUG SCREEN (HOSP PERFORMED) - Abnormal; Notable for the following:    Cocaine POSITIVE (*)    All other components within normal limits  CBG MONITORING, ED - Abnormal; Notable for the following:    Glucose-Capillary 246 (*)    All other components within normal limits  CBC  ETHANOL  SALICYLATE LEVEL    Imaging Review Dg Chest 2 View  04/13/2014   CLINICAL DATA:  49 year old male with a history of drug abuse, suicidal ideation. Depression.  EXAM: CHEST - 2 VIEW  COMPARISON:  Chest CT 02/12/2014, chest x-ray 02/12/2014  FINDINGS: Cardiomediastinal silhouette projects within normal limits in size and contour. No confluent airspace  disease, pneumothorax, or pleural effusion.  No displaced fracture.  Unremarkable appearance of the upper abdomen.  IMPRESSION: No radiographic evidence of acute cardiopulmonary disease.  Signed,  Dulcy Fanny. Earleen Newport, DO  [facility]  [facility]   Electronically Signed   By: Corrie Mckusick D.O.   On: 04/13/2014 10:05     EKG Interpretation None      MDM   Final diagnoses:  Cough  Suicidal ideations  Depression  Alcohol abuse  Cocaine abuse    Patient is not having any chest pain today after ingesting a large amount of cocaine last night. He is tearful and suicidal. He overdose on cocaine in attempt to harm himself.  9:00am TTS consulted Diabetic protocol ordered Home meds reviewed Screening labs and chest xray are pending Psych hold orders- will hold  off for now until patient is medically cleared.  9:37am Patient meets inpatient psychiatric criteria. He is a registered sex offender for rape and spent 15 years in prison and therefore can not be placed at [facility]. They will try to find placement elsewhere.  10: 54 am Patient medically cleared at this time. Has been fed, had home meds and glucose of 254. He is resting quality- will move to Pod C, psych holding orders placed.  Filed Vitals:   04/13/14 0918  BP: 139/101  Pulse: 97  Temp:   Resp: [address], PA-C 04/13/14 Gladstone Ray, MD 04/13/14 1601

## 2014-04-13 NOTE — ED Notes (Signed)
CBG 309 

## 2014-04-13 NOTE — Progress Notes (Signed)
CSW noticed patient has 4 pending felony charges in [place] on 21 March.

## 2014-04-13 NOTE — Progress Notes (Signed)
PT placed on a cpap auto titrate

## 2014-04-13 NOTE — ED Notes (Signed)
Carb modified diet ordered for pt

## 2014-04-13 NOTE — ED Notes (Signed)
Sitter at bedside.
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
## 2014-04-13 NOTE — ED Notes (Signed)
Respiratory at bedside to place pt on CPAP

## 2014-04-13 NOTE — ED Notes (Signed)
Pt here for stress and suicidal thoughts. sts also needs help with alcohol. sts last drink last night.

## 2014-04-13 NOTE — BH Assessment (Signed)
[place] Assessment Progress Note   Called and scheduled pt's tele assessment and gathered clinical information on the pt from Delos Haring, [place] at [phone].  Pt to be seen by this clinician.  Shaune Pascal, MS, [facility] Licensed Professional Counselor Therapeutic Triage Specialist [facility] Phone: [phone] Fax: [phone]

## 2014-04-14 DIAGNOSIS — F1410 Cocaine abuse, uncomplicated: Secondary | ICD-10-CM

## 2014-04-14 DIAGNOSIS — R45851 Suicidal ideations: Secondary | ICD-10-CM

## 2014-04-14 LAB — CBG MONITORING, ED
GLUCOSE-CAPILLARY: 309 mg/dL — AB (ref 70–99)
GLUCOSE-CAPILLARY: 278 mg/dL — AB (ref 70–99)
GLUCOSE-CAPILLARY: 254 mg/dL — AB (ref 70–99)
Glucose-Capillary: 260 mg/dL — ABNORMAL HIGH (ref 70–99)
Glucose-Capillary: 247 mg/dL — ABNORMAL HIGH (ref 70–99)

## 2014-04-14 NOTE — ED Notes (Signed)
Spoke with [facility] assessment. States patient is up for discharge. States that the initial note was an earlier assessment and that the final disposition is to discharge

## 2014-04-14 NOTE — ED Notes (Signed)
Notified RN of CBG 260

## 2014-04-14 NOTE — ED Notes (Signed)
[place] called to check on pt. Pt denies SI at this time. [place] to call back for reassessment by counselor.

## 2014-04-14 NOTE — ED Notes (Signed)
Patient on the phone with spouse.

## 2014-04-14 NOTE — Progress Notes (Addendum)
Per FNP [facility] patient is for discharge as of 4pm.  Writer spoke with pt's RN Aaron Edelman at 6pm and with RN Jody at 10:20pm and asked if pt was being discharged.  Per RN Jody, there is a confusion in the notes about pt's discharge. Writer informed RN Jeral Fruit that patient is for discharge.  Writer faxed OPT Resources for [place] for patient to [place] at [phone].  Verlon Setting, [place] Disposition staff 04/14/2014 10:34 PM
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
## 2014-04-14 NOTE — ED Notes (Signed)
TTS at bedside. 

## 2014-04-14 NOTE — ED Notes (Signed)
Patient using the shower.

## 2014-04-14 NOTE — ED Notes (Signed)
Spoke with pharmacy to resend a new Loratadine tablet.

## 2014-04-14 NOTE — ED Notes (Signed)
TTS called stating since pt states he is no longer SI, NP would like to do a telepysch consult with patient at 1615. Tele pysch machine to be placed at bedside then.

## 2014-04-14 NOTE — Progress Notes (Signed)
Pt off cpap on room air at this time.

## 2014-05-06 ENCOUNTER — Encounter (HOSPITAL_COMMUNITY): Payer: Self-pay | Admitting: *Deleted

## 2014-05-06 ENCOUNTER — Emergency Department (HOSPITAL_COMMUNITY)
Admission: EM | Admit: 2014-05-06 | Discharge: 2014-05-07 | Disposition: A | Discharge status: Other Acute Inpatient Hospital | Payer: 59 | Attending: Emergency Medicine | Admitting: Emergency Medicine

## 2014-05-06 DIAGNOSIS — Z794 Long term (current) use of insulin: Secondary | ICD-10-CM | POA: Insufficient documentation

## 2014-05-06 DIAGNOSIS — Z720 Tobacco use: Secondary | ICD-10-CM | POA: Diagnosis not present

## 2014-05-06 DIAGNOSIS — Z8669 Personal history of other diseases of the nervous system and sense organs: Secondary | ICD-10-CM | POA: Insufficient documentation

## 2014-05-06 DIAGNOSIS — Z7951 Long term (current) use of inhaled steroids: Secondary | ICD-10-CM | POA: Insufficient documentation

## 2014-05-06 DIAGNOSIS — F329 Major depressive disorder, single episode, unspecified: Secondary | ICD-10-CM | POA: Diagnosis not present

## 2014-05-06 DIAGNOSIS — F1410 Cocaine abuse, uncomplicated: Secondary | ICD-10-CM | POA: Diagnosis not present

## 2014-05-06 DIAGNOSIS — E119 Type 2 diabetes mellitus without complications: Secondary | ICD-10-CM | POA: Insufficient documentation

## 2014-05-06 DIAGNOSIS — R45851 Suicidal ideations: Secondary | ICD-10-CM

## 2014-05-06 DIAGNOSIS — Z008 Encounter for other general examination: Secondary | ICD-10-CM | POA: Diagnosis present

## 2014-05-06 DIAGNOSIS — Z79899 Other long term (current) drug therapy: Secondary | ICD-10-CM | POA: Diagnosis not present

## 2014-05-06 DIAGNOSIS — F32A Depression, unspecified: Secondary | ICD-10-CM

## 2014-05-06 HISTORY — DX: Major depressive disorder, single episode, unspecified: F32.9

## 2014-05-06 HISTORY — DX: Depression, unspecified: F32.A

## 2014-05-06 LAB — CBC
HEMATOCRIT: 45.8 % (ref 39.0–52.0)
HEMOGLOBIN: 16.1 g/dL (ref 13.0–17.0)
MCH: 30.5 pg (ref 26.0–34.0)
MCHC: 35.2 g/dL (ref 30.0–36.0)
MCV: 86.7 fL (ref 78.0–100.0)
Platelets: 340 10*3/uL (ref 150–400)
RBC: 5.28 MIL/uL (ref 4.22–5.81)
RDW: 12.7 % (ref 11.5–15.5)
WBC: 8.4 10*3/uL (ref 4.0–10.5)

## 2014-05-06 LAB — RAPID URINE DRUG SCREEN, HOSP PERFORMED
Amphetamines: NOT DETECTED
Barbiturates: NOT DETECTED
Benzodiazepines: NOT DETECTED
COCAINE: POSITIVE — AB
OPIATES: NOT DETECTED
Tetrahydrocannabinol: NOT DETECTED

## 2014-05-06 LAB — COMPREHENSIVE METABOLIC PANEL
ALT: 29 U/L (ref 0–53)
AST: 24 U/L (ref 0–37)
Albumin: 4.1 g/dL (ref 3.5–5.2)
Alkaline Phosphatase: 68 U/L (ref 39–117)
Anion gap: 15 (ref 5–15)
BILIRUBIN TOTAL: 0.5 mg/dL (ref 0.3–1.2)
BUN: 17 mg/dL (ref 6–23)
CO2: 24 mmol/L (ref 19–32)
CREATININE: 1.47 mg/dL — AB (ref 0.50–1.35)
Calcium: 9.6 mg/dL (ref 8.4–10.5)
Chloride: 95 mmol/L — ABNORMAL LOW (ref 96–112)
GFR calc Af Amer: 63 mL/min — ABNORMAL LOW (ref >90)
GFR, EST NON AFRICAN AMERICAN: 55 mL/min — AB (ref >90)
Glucose, Bld: 335 mg/dL — ABNORMAL HIGH (ref 70–99)
Potassium: 4.5 mmol/L (ref 3.5–5.1)
Sodium: 134 mmol/L — ABNORMAL LOW (ref 135–145)
TOTAL PROTEIN: 7.3 g/dL (ref 6.0–8.3)

## 2014-05-06 LAB — CBG MONITORING, ED
GLUCOSE-CAPILLARY: 251 mg/dL — AB (ref 70–99)
Glucose-Capillary: 342 mg/dL — ABNORMAL HIGH (ref 70–99)
Glucose-Capillary: 268 mg/dL — ABNORMAL HIGH (ref 70–99)

## 2014-05-06 LAB — SALICYLATE LEVEL: Salicylate Lvl: <4 mg/dL (ref 2.8–20.0)

## 2014-05-06 LAB — TROPONIN I: Troponin I: <0.03 ng/mL (ref <0.031)

## 2014-05-06 LAB — I-STAT TROPONIN, ED: Troponin i, poc: 0 ng/mL (ref 0.00–0.08)

## 2014-05-06 LAB — ACETAMINOPHEN LEVEL

## 2014-05-06 LAB — ETHANOL: Alcohol, Ethyl (B): <5 mg/dL (ref 0–9)

## 2014-05-06 MED ORDER — ACETAMINOPHEN 325 MG PO TABS: 650.0000 mg | ORAL_TABLET | ORAL | Status: DC | PRN

## 2014-05-06 MED ORDER — ONDANSETRON HCL 4 MG PO TABS: 4.0000 mg | ORAL_TABLET | Freq: Three times a day (TID) | ORAL | Status: DC | PRN

## 2014-05-06 MED ORDER — SODIUM CHLORIDE 0.9 % IV BOLUS (SEPSIS)
1000.0000 mL | Freq: Once | INTRAVENOUS | Status: AC
  Administered 2014-05-06: 1000 mL via INTRAVENOUS

## 2014-05-06 MED ORDER — LISINOPRIL 10 MG PO TABS
10.0000 mg | ORAL_TABLET | Freq: Every day | ORAL | Status: DC
  Administered 2014-05-06: 10 mg via ORAL
  Filled 2014-05-06: qty 1

## 2014-05-06 MED ORDER — INSULIN ASPART 100 UNIT/ML ~~LOC~~ SOLN
0.0000 [IU] | SUBCUTANEOUS | Status: DC
  Administered 2014-05-06: 12 [IU] via SUBCUTANEOUS
  Filled 2014-05-06: qty 1

## 2014-05-06 MED ORDER — IBUPROFEN 400 MG PO TABS: 600.0000 mg | ORAL_TABLET | Freq: Three times a day (TID) | ORAL | Status: DC | PRN

## 2014-05-06 MED ORDER — LORAZEPAM 1 MG PO TABS: 1.0000 mg | ORAL_TABLET | Freq: Three times a day (TID) | ORAL | Status: DC | PRN

## 2014-05-06 MED ORDER — INSULIN DETEMIR 100 UNIT/ML ~~LOC~~ SOLN
40.0000 [IU] | Freq: Every day | SUBCUTANEOUS | Status: DC
  Filled 2014-05-06: qty 0.4

## 2014-05-06 MED ORDER — ALUM & MAG HYDROXIDE-SIMETH 200-200-20 MG/5ML PO SUSP: 30.0000 mL | ORAL | Status: DC | PRN

## 2014-05-06 MED ORDER — ALBUTEROL SULFATE HFA 108 (90 BASE) MCG/ACT IN AERS: 1.0000 | INHALATION_SPRAY | Freq: Four times a day (QID) | RESPIRATORY_TRACT | Status: DC | PRN

## 2014-05-06 NOTE — ED Notes (Signed)
Security reports patient has already been wanded.

## 2014-05-06 NOTE — ED Notes (Signed)
Pt in gown, requested a sitter, called security to wand pt.

## 2014-05-06 NOTE — ED Notes (Signed)
Patient states multiple stress factors, death of kids with one son who had a recent birthday, financial issues. Reports being here recently about 3 weeks ago, denies seeing a psychiatrist.  Current plan to cut wrists, or "run into something".

## 2014-05-06 NOTE — ED Notes (Signed)
CBG 268

## 2014-05-06 NOTE — ED Notes (Signed)
Reported to dr. Doy Mince that patient wears cpap at night. He acknowledges, no new orders at this time.

## 2014-05-06 NOTE — ED Provider Notes (Signed)
CSN: [id number]     Arrival date & time 05/06/14  1723 History   First MD Initiated Contact with Patient 05/06/14 2018     Chief Complaint  Patient presents with  . Suicidal  . Medical Clearance     (Consider location/radiation/quality/duration/timing/severity/associated sxs/prior Treatment) Patient is a 49 y.o. male presenting with mental health disorder.  Mental Health Problem Presenting symptoms: depression and suicidal thoughts   Degree of incapacity (severity):  Severe Onset quality:  Gradual Duration: A few weeks. Timing:  Constant Progression:  Worsening Chronicity:  Recurrent Context: alcohol use, drug abuse and stressful life event   Relieved by:  Nothing Associated symptoms: anhedonia and feelings of worthlessness   Associated symptoms: no chest pain     Past Medical History  Diagnosis Date  . Diabetes mellitus   . Sleep apnea    Past Surgical History  Procedure Laterality Date  . Tonsillectomy     Family History  Problem Relation Age of Onset  . Hypertension Mother   . Hypertension Father   . Diabetes Father    History  Substance Use Topics  . Smoking status: Current Some Day Smoker    Types: Cigars  . Smokeless tobacco: Not on file  . Alcohol Use: 1.8 oz/week    1 Glasses of wine, 1 Cans of beer, 1 Shots of liquor per week     Comment: Occasionally    Review of Systems  Cardiovascular: Negative for chest pain.  Psychiatric/Behavioral: Positive for suicidal ideas.  All other systems reviewed and are negative.     Allergies  Review of patient's allergies indicates no known allergies.  Home Medications   Prior to Admission medications   Medication Sig Start Date End Date Taking? Authorizing Provider  acetaminophen (TYLENOL) 500 MG tablet Take 1,000 mg by mouth every 6 (six) hours as needed for mild pain.    Yes Historical Provider, MD  albuterol (PROVENTIL HFA;VENTOLIN HFA) 108 (90 BASE) MCG/ACT inhaler Inhale 1-2 puffs into the lungs every  6 (six) hours as needed for wheezing or shortness of breath.   Yes Historical Provider, MD  fluticasone (FLONASE) 50 MCG/ACT nasal spray Place 1 spray into both nostrils daily.   Yes Historical Provider, MD  glipiZIDE (GLUCOTROL) 10 MG tablet Take 10 mg by mouth daily before breakfast.   Yes Historical Provider, MD  insulin aspart (NOVOLOG) 100 UNIT/ML injection Inject 4-12 Units into the skin 2 (two) times daily before a meal. Sliding scale   Yes Historical Provider, MD  insulin detemir (LEVEMIR) 100 UNIT/ML injection Inject 0.2 mLs (20 Units total) into the skin daily. Patient taking differently: Inject 40 Units into the skin daily.  02/14/14  Yes Belkys A Regalado, MD  lisinopril (PRINIVIL,ZESTRIL) 10 MG tablet Take 10 mg by mouth daily.   Yes Historical Provider, MD  loratadine (CLARITIN) 10 MG tablet Take 10 mg by mouth daily.   Yes Historical Provider, MD  metFORMIN (GLUCOPHAGE) 500 MG tablet Take 500 mg by mouth daily.    Yes Historical Provider, MD  oxyCODONE (OXY IR/ROXICODONE) 5 MG immediate release tablet Take 1 tablet (5 mg total) by mouth every 6 (six) hours as needed for moderate pain. 07/01/13  Yes Shanker Kristeen Mans, MD   BP 130/90 mmHg  Pulse 111  Temp(Src) 97.5 F (36.4 C) (Oral)  Resp 18  Ht 5\' 7"  (1.702 m)  Wt 230 lb (104.327 kg)  BMI 36.01 kg/m2  SpO2 98% Physical Exam  Constitutional: He is oriented to person, place, and  time. He appears well-developed and well-nourished. No distress.  HENT:  Head: Normocephalic and atraumatic.  Eyes: Conjunctivae are normal. No scleral icterus.  Neck: Neck supple.  Cardiovascular: Normal rate and intact distal pulses.   Pulmonary/Chest: Effort normal. No stridor. No respiratory distress.  Abdominal: Normal appearance. He exhibits no distension.  Neurological: He is alert and oriented to person, place, and time.  Skin: Skin is warm and dry. No rash noted.  Psychiatric: He is withdrawn. He exhibits a depressed mood.  Nursing note and  vitals reviewed.   ED Course  Procedures (including critical care time) Labs Review Labs Reviewed  ACETAMINOPHEN LEVEL - Abnormal; Notable for the following:    Acetaminophen (Tylenol), Serum <10.0 (*)    All other components within normal limits  COMPREHENSIVE METABOLIC PANEL - Abnormal; Notable for the following:    Sodium 134 (*)    Chloride 95 (*)    Glucose, Bld 335 (*)    Creatinine, Ser 1.47 (*)    GFR calc non Af Amer 55 (*)    GFR calc Af Amer 63 (*)    All other components within normal limits  URINE RAPID DRUG SCREEN (HOSP PERFORMED) - Abnormal; Notable for the following:    Cocaine POSITIVE (*)    All other components within normal limits  CBG MONITORING, ED - Abnormal; Notable for the following:    Glucose-Capillary 342 (*)    All other components within normal limits  CBG MONITORING, ED - Abnormal; Notable for the following:    Glucose-Capillary 251 (*)    All other components within normal limits  CBC  ETHANOL  SALICYLATE LEVEL  TROPONIN I  I-STAT TROPOININ, ED    Imaging Review No results found.   EKG Interpretation   Date/Time:  Tuesday May 06 2014 17:42:22 EDT Ventricular Rate:  133 PR Interval:  136 QRS Duration: 76 QT Interval:  312 QTC Calculation: 464 R Axis:   62 Text Interpretation:  Sinus tachycardia Otherwise normal ECG No  significant change was found Confirmed by [facility]  MD, TREY ([id number]) on  05/06/2014 9:36:05 PM      MDM   Final diagnoses:  Depression  Suicidal ideation    49 year old male with history of diabetes who presents with suicidal ideations with a plan to drive his car in traffic or cut his wrists. He also reports doing cocaine shortly before arrival. This likely explains his severe tachycardia, which resolved at time that he was bedded from the waiting room. Regarding this, will check belt to troponin, but no signs or symptoms of cardiac compromise.  He will also need to be treated for his diabetes. Otherwise, he  appears stable for psychiatric evaluation.  11:02 PM Delta trop negative, blood sugars responding.  Psych Hold.    Serita Grit, MD 05/06/14 [phone]

## 2014-05-06 NOTE — ED Notes (Signed)
Pt reports being suicidal, denies making any attempts. Was recently here for same. Last used crack approx 30 mins ago. Pt is diaphoretic and HR 140 at triage.

## 2014-05-06 NOTE — ED Notes (Signed)
Explained use of no cellphones and visitation hours to patient.

## 2014-05-06 NOTE — ED Notes (Signed)
cbg is 251

## 2014-05-07 ENCOUNTER — Encounter (HOSPITAL_COMMUNITY): Payer: Self-pay | Admitting: *Deleted

## 2014-05-07 ENCOUNTER — Inpatient Hospital Stay (HOSPITAL_COMMUNITY)
Admission: EM | Admit: 2014-05-07 | Discharge: 2014-05-08 | DRG: 897 | Disposition: A | Discharge status: Home / Self Care | Payer: 59 | Source: Intra-hospital | Attending: Psychiatry | Admitting: Psychiatry

## 2014-05-07 DIAGNOSIS — F1010 Alcohol abuse, uncomplicated: Secondary | ICD-10-CM | POA: Insufficient documentation

## 2014-05-07 DIAGNOSIS — F17210 Nicotine dependence, cigarettes, uncomplicated: Secondary | ICD-10-CM | POA: Diagnosis present

## 2014-05-07 DIAGNOSIS — F1414 Cocaine abuse with cocaine-induced mood disorder: Secondary | ICD-10-CM | POA: Diagnosis present

## 2014-05-07 DIAGNOSIS — R45851 Suicidal ideations: Secondary | ICD-10-CM | POA: Diagnosis present

## 2014-05-07 DIAGNOSIS — F1994 Other psychoactive substance use, unspecified with psychoactive substance-induced mood disorder: Secondary | ICD-10-CM | POA: Diagnosis present

## 2014-05-07 DIAGNOSIS — E119 Type 2 diabetes mellitus without complications: Secondary | ICD-10-CM | POA: Diagnosis present

## 2014-05-07 DIAGNOSIS — F329 Major depressive disorder, single episode, unspecified: Secondary | ICD-10-CM | POA: Diagnosis present

## 2014-05-07 LAB — LIPID PANEL
CHOL/HDL RATIO: 5.2 ratio
Cholesterol: 261 mg/dL — ABNORMAL HIGH (ref 0–200)
HDL: 50 mg/dL (ref >39)
LDL Cholesterol: 163 mg/dL — ABNORMAL HIGH (ref 0–99)
TRIGLYCERIDES: 242 mg/dL — AB (ref <150)
VLDL: 48 mg/dL — ABNORMAL HIGH (ref 0–40)

## 2014-05-07 LAB — TSH: TSH: 2.561 u[IU]/mL (ref 0.350–4.500)

## 2014-05-07 LAB — CBG MONITORING, ED: Glucose-Capillary: 202 mg/dL — ABNORMAL HIGH (ref 70–99)

## 2014-05-07 LAB — GLUCOSE, CAPILLARY
GLUCOSE-CAPILLARY: 302 mg/dL — AB (ref 70–99)
Glucose-Capillary: 277 mg/dL — ABNORMAL HIGH (ref 70–99)
Glucose-Capillary: 273 mg/dL — ABNORMAL HIGH (ref 70–99)
Glucose-Capillary: 334 mg/dL — ABNORMAL HIGH (ref 70–99)

## 2014-05-07 MED ORDER — TRAZODONE HCL 50 MG PO TABS
50.0000 mg | ORAL_TABLET | Freq: Every evening | ORAL | Status: DC | PRN
  Administered 2014-05-07: 50 mg via ORAL
  Filled 2014-05-07 (×4): qty 1

## 2014-05-07 MED ORDER — INSULIN ASPART 100 UNIT/ML ~~LOC~~ SOLN
0.0000 [IU] | Freq: Every day | SUBCUTANEOUS | Status: DC
Start: 2014-05-07 — End: 2014-05-08
  Administered 2014-05-07: 4 [IU] via SUBCUTANEOUS

## 2014-05-07 MED ORDER — INSULIN ASPART 100 UNIT/ML ~~LOC~~ SOLN: 0.0000 [IU] | Freq: Three times a day (TID) | SUBCUTANEOUS | Status: DC

## 2014-05-07 MED ORDER — ACETAMINOPHEN 500 MG PO TABS: 1000.0000 mg | ORAL_TABLET | Freq: Four times a day (QID) | ORAL | Status: DC | PRN

## 2014-05-07 MED ORDER — METFORMIN HCL 500 MG PO TABS
500.0000 mg | ORAL_TABLET | Freq: Every day | ORAL | Status: DC
  Administered 2014-05-07 – 2014-05-08 (×2): 500 mg via ORAL
  Filled 2014-05-07 (×4): qty 1

## 2014-05-07 MED ORDER — METHOCARBAMOL 500 MG PO TABS: 500.0000 mg | ORAL_TABLET | Freq: Three times a day (TID) | ORAL | Status: DC | PRN

## 2014-05-07 MED ORDER — LOPERAMIDE HCL 2 MG PO CAPS: 2.0000 mg | ORAL_CAPSULE | ORAL | Status: DC | PRN

## 2014-05-07 MED ORDER — DICYCLOMINE HCL 20 MG PO TABS: 20.0000 mg | ORAL_TABLET | Freq: Four times a day (QID) | ORAL | Status: DC | PRN

## 2014-05-07 MED ORDER — ALUM & MAG HYDROXIDE-SIMETH 200-200-20 MG/5ML PO SUSP: 30.0000 mL | ORAL | Status: DC | PRN

## 2014-05-07 MED ORDER — FLUTICASONE PROPIONATE 50 MCG/ACT NA SUSP
1.0000 | Freq: Every day | NASAL | Status: DC
  Administered 2014-05-07 – 2014-05-08 (×2): 1 via NASAL
  Filled 2014-05-07: qty 16

## 2014-05-07 MED ORDER — SERTRALINE HCL 50 MG PO TABS
50.0000 mg | ORAL_TABLET | Freq: Every day | ORAL | Status: DC
  Administered 2014-05-08: 50 mg via ORAL
  Filled 2014-05-07 (×2): qty 1
  Filled 2014-05-07: qty 4

## 2014-05-07 MED ORDER — ONDANSETRON 4 MG PO TBDP: 4.0000 mg | ORAL_TABLET | Freq: Four times a day (QID) | ORAL | Status: DC | PRN

## 2014-05-07 MED ORDER — INSULIN DETEMIR 100 UNIT/ML ~~LOC~~ SOLN
40.0000 [IU] | Freq: Every day | SUBCUTANEOUS | Status: DC
  Administered 2014-05-07 – 2014-05-08 (×2): 40 [IU] via SUBCUTANEOUS

## 2014-05-07 MED ORDER — HYDROXYZINE HCL 25 MG PO TABS
25.0000 mg | ORAL_TABLET | Freq: Four times a day (QID) | ORAL | Status: DC | PRN
  Filled 2014-05-07: qty 8

## 2014-05-07 MED ORDER — NAPROXEN 500 MG PO TABS
500.0000 mg | ORAL_TABLET | Freq: Two times a day (BID) | ORAL | Status: DC | PRN
  Administered 2014-05-07 (×2): 500 mg via ORAL
  Filled 2014-05-07 (×2): qty 1

## 2014-05-07 MED ORDER — GLIPIZIDE 10 MG PO TABS
10.0000 mg | ORAL_TABLET | Freq: Every day | ORAL | Status: DC
  Administered 2014-05-07 – 2014-05-08 (×2): 10 mg via ORAL
  Filled 2014-05-07 (×4): qty 1
  Filled 2014-05-07: qty 2

## 2014-05-07 MED ORDER — MAGNESIUM HYDROXIDE 400 MG/5ML PO SUSP: 30.0000 mL | Freq: Every day | ORAL | Status: DC | PRN

## 2014-05-07 MED ORDER — ALBUTEROL SULFATE HFA 108 (90 BASE) MCG/ACT IN AERS: 1.0000 | INHALATION_SPRAY | Freq: Four times a day (QID) | RESPIRATORY_TRACT | Status: DC | PRN

## 2014-05-07 MED ORDER — SIMVASTATIN 10 MG PO TABS
10.0000 mg | ORAL_TABLET | Freq: Every day | ORAL | Status: DC
  Administered 2014-05-07: 10 mg via ORAL
  Filled 2014-05-07: qty 1
  Filled 2014-05-07: qty 4
  Filled 2014-05-07: qty 1

## 2014-05-07 MED ORDER — LISINOPRIL 10 MG PO TABS
10.0000 mg | ORAL_TABLET | Freq: Every day | ORAL | Status: DC
  Administered 2014-05-07 – 2014-05-08 (×2): 10 mg via ORAL
  Filled 2014-05-07 (×4): qty 1

## 2014-05-07 MED ORDER — INSULIN ASPART 100 UNIT/ML ~~LOC~~ SOLN
4.0000 [IU] | Freq: Three times a day (TID) | SUBCUTANEOUS | Status: DC
  Administered 2014-05-07 – 2014-05-08 (×5): 4 [IU] via SUBCUTANEOUS

## 2014-05-07 MED ORDER — LORATADINE 10 MG PO TABS
10.0000 mg | ORAL_TABLET | Freq: Every day | ORAL | Status: DC
  Administered 2014-05-07 – 2014-05-08 (×2): 10 mg via ORAL
  Filled 2014-05-07 (×4): qty 1

## 2014-05-07 NOTE — [facility] Suicide Risk Assessment (Signed)
[facility] Admission Suicide Risk Assessment   Nursing information obtained from:    Demographic factors:   49 year old man, lives with wife, employed  Current Mental Status:   see below Loss Factors:   son  And step son died two years ago  Historical Factors:    Risk Reduction Factors:    Total Time spent with patient: 45 minutes Principal Problem: <principal problem not specified> Diagnosis:   Patient Active Problem List   Diagnosis Date Noted  . Substance induced mood disorder [F19.94] 05/07/2014  . Suicidal ideations [R45.851]   . Acute respiratory failure [J96.00] 02/12/2014  . Fever [R50.9] 02/12/2014  . Sinus tachycardia [I47.1] 02/12/2014  . Cellulitis [L03.90] 06/28/2013  . Scrotal infection [N49.2] 06/28/2013  . Diabetes mellitus, type 2 [E11.9] 06/28/2013  . Cocaine abuse [F14.10] 06/28/2013  . Marijuana abuse [F12.10] 06/28/2013  . Leukocytosis [D72.829] 06/28/2013  . Hyponatremia [E87.1] 06/28/2013  . CAP (community acquired pneumonia) [J18.9] 12/09/2012  . Controlled diabetes mellitus type II without complication [V78.4] 69/62/9528     Continued Clinical Symptoms:  Alcohol Use Disorder Identification Test Final Score (AUDIT): 21 The "Alcohol Use Disorders Identification Test", Guidelines for Use in Primary Care, Second Edition.  World Pharmacologist [facility]). Score between 0-7:  no or low risk or alcohol related problems. Score between 8-15:  moderate risk of alcohol related problems. Score between 16-19:  high risk of alcohol related problems. Score 20 or above:  warrants further diagnostic evaluation for alcohol dependence and treatment.   CLINICAL FACTORS:  49 year old man, reports that he has had a lot of stressors in his life over the last couple of years. His son passed away from a heart condition in 2014, his stepson was murdered in 2015, his business truck broke down, and he has been facing financial difficulties. Over the last year he had started drinking  and using cocaine , which he states was to deal with the stress. Last used alcohol 4-5 days ago and is not currently presenting with any WDL, last used cocaine 2 days ago.  He has been drinking about 6-8 beers 3-4 times a week, and has been using cocaine a few times a week as well. Recently started feeling more depressed and had some suicidal ideations, so that he decided to come to hospital. Dx- Depression NOS, Cocaine Abuse, Alcohol Abuse . Plan - continue inpatient treatment . Agrees to start Zoloft at 50 mgrs QDAY , side effects reviewed     Musculoskeletal: Strength & Muscle Tone: within normal limits Gait & Station: normal Patient leans: N/A  Psychiatric Specialty Exam: Physical Exam  ROS  Blood pressure 109/77, pulse 101, temperature 97.4 F (36.3 C), temperature source Oral, resp. rate 18, height 5\' 7"  (1.702 m), weight 228 lb (103.42 kg).Body mass index is 35.7 kg/(m^2).  General Appearance: Fairly Groomed  Engineer, water::  Good  Speech:  Normal Rate  Volume:  Normal  Mood:  Depressed  Affect:  Appropriate and mildly constricted   Thought Process:  Goal Directed and Linear  Orientation:  Full (Time, Place, and Person)  Thought Content:  denies hallucinations, no delusions  Suicidal Thoughts:  No at this time denies any thoughts of hurting self or anyone else   Homicidal Thoughts:  No  Memory: recent and remote grossly intact   Judgement:  Fair  Insight:  Present  Psychomotor Activity:  Decreased  Concentration:  Good  Recall:  Good  Fund of Knowledge:Good  Language: Good  Akathisia:  No  Handed:  Right  AIMS (if indicated):     Assets:  Desire for Improvement Resilience Vocational/Educational  Sleep:  Number of Hours: 2.25  Cognition: WNL  ADL's:  Impaired     COGNITIVE FEATURES THAT CONTRIBUTE TO RISK:  Closed-mindedness    SUICIDE RISK:   Moderate:  Frequent suicidal ideation with limited intensity, and duration, some specificity in terms of plans, no  associated intent, good self-control, limited dysphoria/symptomatology, some risk factors present, and identifiable protective factors, including available and accessible social support.  PLAN OF CARE:Patient will be admitted to inpatient psychiatric unit for stabilization and safety. Will provide and encourage milieu participation. Provide medication management and maked adjustments as needed.  Will follow daily.    Medical Decision Making:  Review of Psycho-Social Stressors (1), Review or order clinical lab tests (1), Established Problem, Worsening (2) and Review of Medication Regimen & Side Effects (2)  I certify that inpatient services furnished can reasonably be expected to improve the patient's condition.   Shaquille Janes, [place] 05/07/2014, 6:06 PM

## 2014-05-07 NOTE — H&P (Signed)
Psychiatric Admission Assessment Adult  Patient Identification: Jonathan Austin MRN:  [id number] Date of Evaluation:  05/07/2014 Chief Complaint:  MDD Cocaine Use Disorder Principal Diagnosis: <principal problem not specified> Diagnosis:   Patient Active Problem List   Diagnosis Date Noted  . Substance induced mood disorder [F19.94] 05/07/2014  . Suicidal ideations [R45.851]   . Acute respiratory failure [J96.00] 02/12/2014  . Fever [R50.9] 02/12/2014  . Sinus tachycardia [I47.1] 02/12/2014  . Cellulitis [L03.90] 06/28/2013  . Scrotal infection [N49.2] 06/28/2013  . Diabetes mellitus, type 2 [E11.9] 06/28/2013  . Cocaine abuse [F14.10] 06/28/2013  . Marijuana abuse [F12.10] 06/28/2013  . Leukocytosis [D72.829] 06/28/2013  . Hyponatremia [E87.1] 06/28/2013  . CAP (community acquired pneumonia) [J18.9] 12/09/2012  . 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  Use most savings to bury sons; now having to buy car parts; could find tools to fix car.  Everything just came down on me so I went to a friend to get high, weed, beer, cocaine.  Then I was saying I couldn't do this to my wife; we've been together for two years and saying I can't do this to her.  I started having the thoughts that I would just kill my self by cutting my wrist; that is why I came to the hospital to get some help.  While in the hospital coming down off my high I started feeling better and thoughts started changing.  States that he has since spoke to his wife while in the hospital and is now feeling better.  States "Even thought things are little bad with the business we still got  other things going on and it's not as bad as I'm making it." Patient states "I always have had a problem with failure and when it happens I just go off the limb; 4-5 years ago the same thing happened.  I did try to cut my wrist but I couldn't do it." States that he has only been in hospital for substance abuse.  "My problem Was I didn't to my follow ups."  Patient denies history of psychotropic medications.  Patient states that he is usually stable person and keeps busy; States that he doesn't have extreme anxiety of depression.   Patient state that he use crack cocaine and mariajuana, "alcohol I can do 2-3 times a week."   Denies withdrawal symptoms when not drinking alcohol; also denies history of seizures/DT's. At this time patient denies suicidal/homicidal ideation, psychosis, and paranoia.   Patient states that his biggest problem is his drug use and then feeling overwhelmed when everything seemed to fall apart.  After getting intoxicated started having the suicidal thoughts.  States that he feels better since talking to his wife and he feels that he would do better with outpatient services that he can continue to follow up with.   Patient states that he would like to be discharge and just set up with out patient services.    Elements:  Location:  Worsening anxiety. Quality:  Polysubstance abuse. Severity:  Substance induce mood disorder. Duration:  1 day. Associated Signs/Symptoms: Depression Symptoms:  Feeling overwhelmed with mutiple stressors  that occured all at once (Hypo) Manic Symptoms:  Denies Anxiety Symptoms:  Excessive Worry, Psychotic Symptoms:  Denies PTSD Symptoms: Denies Total Time spent with patient: 1 hour  Past Medical History:  Past Medical History  Diagnosis Date  . Diabetes mellitus   . Sleep apnea   . Depression     Past Surgical History  Procedure Laterality Date  . Tonsillectomy     Family History:  Family History  Problem Relation Age of Onset  .  Hypertension Mother   . Hypertension Father   . Diabetes Father    Social History:  History  Alcohol Use  . 1.8 oz/week  . 1 Glasses of wine, 1 Cans of beer, 1 Shots of liquor per week    Comment: Occasionally     History  Drug Use  . Yes  . Special: Cocaine, Marijuana    History   Social History  . Marital Status: Married    Spouse Name: N/A  . Number of Children: N/A  . Years of Education: N/A   Social History Main Topics  . Smoking status: Current Some Day Smoker    Types: Cigars  . Smokeless tobacco: Not on file  . Alcohol Use: 1.8 oz/week    1 Glasses of wine, 1 Cans of beer, 1 Shots of liquor per week     Comment: Occasionally  . Drug Use: Yes    Special: Cocaine, Marijuana  . Sexual Activity: Yes   Other Topics Concern  . None   Social History Narrative   Additional Social History:    Pain Medications: denies Prescriptions: see PTA list Over the Counter: tylenol History of alcohol / drug use?: Yes Longest period of sobriety (when/how long): Only when in detox  Negative Consequences of Use: Work / Youth worker, Charity fundraiser relationships, Scientist, research (physical sciences), Museum/gallery curator Withdrawal Symptoms: Irritability Name of Substance 1: Crack Cocaine  1 - Age of First Use: 24 YOM  1 - Amount (size/oz): 2 Grams  1 - Frequency: Daily  1 - Duration: On-going  1 - Last Use / Amount: 05/06/14 Name of Substance 2: Alcohol  2 - Age of First Use: 16 YOM  2 - Amount (size/oz): 3-40's  2 - Frequency: Daily  2 - Duration: On-going  2 - Last Use / Amount: 05/06/14 Name of Substance 3: THC  3 - Age of First Use: 16 YOM  3 - Amount (size/oz): 2 Joints  3 - Frequency: Daily  3 - Duration: On-going  3 - Last Use / Amount: 05/06/14    Musculoskeletal: Strength & Muscle Tone: within normal limits Gait & Station: normal Patient leans: N/A  Psychiatric Specialty Exam: Physical Exam  Constitutional: He is oriented to person, place, and time.  Neck: Normal range of motion.  Respiratory:  Effort normal.  Musculoskeletal: Normal range of motion.  Neurological: He is alert and oriented to person, place, and time.    Review of Systems  Cardiovascular:       History of HTN  Musculoskeletal: Positive for back pain and joint pain.  Endo/Heme/Allergies:       History of Type 2 diabetes  Psychiatric/Behavioral: Positive for depression (Rates 9/10 but has decrased to 2-3/10).    Blood pressure 109/77, pulse 101, temperature 97.4 F (36.3 C), temperature source Oral, resp. rate 18, height 5' 7"  (1.702 m), weight 103.42 kg (228 lb).Body mass index is 35.7 kg/(m^2).  General Appearance: Fairly Groomed  Engineer, water::  Good  Speech:  Clear and Coherent and Normal Rate  Volume:  Normal  Mood:  Anxious and Depressed  Affect:  Congruent  Thought Process:  Circumstantial and Goal Directed  Orientation:  Full (Time, Place, and Person)  Thought Content:  Rumination  Suicidal Thoughts:  No Denies at this time  Homicidal Thoughts:  No  Memory:  Immediate;   Good Recent;   Good Remote;   Good  Judgement:  Intact  Insight:  Fair  Psychomotor Activity:  Normal  Concentration:  Fair  Recall:  Good  Fund of Knowledge:Good  Language: Good  Akathisia:  No  Handed:  Right  AIMS (if indicated):     Assets:  Communication Skills Desire for Improvement Housing Social Support  ADL's:  Intact  Cognition: WNL  Sleep:  Number of Hours: 2.25   Risk to Self: Is patient at risk for suicide?: Yes What has been your use of drugs/alcohol within the last 12 months?: alcohol: 2 40oz beers daily. on weekends: 2 40oz beers and 6 pk daily on average. crack cocaine: 1-2 grams "every few days." pt reports no period of sobriety in past 3-4 years. occassional marijuana use.  Risk to Others:   Prior Inpatient Therapy:   Prior Outpatient Therapy:    Alcohol Screening: 1. How often do you have a drink containing alcohol?: 4 or more times a week 2. How many drinks containing alcohol do you have on a  typical day when you are drinking?: 7, 8, or 9 3. How often do you have six or more drinks on one occasion?: Daily or almost daily Preliminary Score: 7 4. How often during the last year have you found that you were not able to stop drinking once you had started?: Never 5. How often during the last year have you failed to do what was normally expected from you becasue of drinking?: Monthly 6. How often during the last year have you needed a first drink in the morning to get yourself going after a heavy drinking session?: Never 7. How often during the last year have you had a feeling of guilt of remorse after drinking?: Daily or almost daily 8. How often during the last year have you been unable to remember what happened the night before because you had been drinking?: Never 9. Have you or someone else been injured as a result of your drinking?: No 10. Has a relative or friend or a doctor or another health worker been concerned about your drinking or suggested you cut down?: Yes, during the last year Alcohol Use Disorder Identification Test Final Score (AUDIT): 21 Brief Intervention: MD notified of score 20 or above  Allergies:  No Known Allergies Lab Results:  Results for orders placed or performed during the hospital encounter of 05/07/14 (from the past 48 hour(s))  Glucose, capillary     Status: Abnormal   Collection Time: 05/07/14  6:13 AM  Result Value Ref Range   Glucose-Capillary 277 (H) 70 - 99 mg/dL  TSH     Status: None   Collection Time: 05/07/14  6:45 AM  Result Value Ref Range   TSH 2.561 0.350 - 4.500 uIU/mL    Comment: Performed at [facility]  Lipid panel, fasting     Status: Abnormal   Collection Time: 05/07/14  6:45 AM  Result Value Ref Range   Cholesterol 261 (H) 0 - 200 mg/dL   Triglycerides 242 (H) <150 mg/dL   HDL 50 >39 mg/dL   Total CHOL/HDL Ratio 5.2 RATIO   VLDL 48 (H) 0 - 40  mg/dL   LDL Cholesterol 163 (H) 0 - 99 mg/dL    Comment:         Total Cholesterol/HDL:CHD Risk Coronary Heart Disease Risk Table                     Men   Women  1/2 Average Risk   3.4   3.3  Average Risk       5.0   4.4  2 X Average Risk   9.6   7.1  3 X Average Risk  23.4   11.0        Use the calculated Patient Ratio above and the CHD Risk Table to determine the patient's CHD Risk.        ATP III CLASSIFICATION (LDL):  <100     mg/dL   Optimal  100-129  mg/dL   Near or Above                    Optimal  130-159  mg/dL   Borderline  160-189  mg/dL   High  >190     mg/dL   Very High Performed at [facility]    Current Medications: Current Facility-Administered Medications  Medication Dose Route Frequency Provider Last Rate Last Dose  . acetaminophen (TYLENOL) tablet 1,000 mg  1,000 mg Oral Q6H PRN Laverle Hobby, PA-C      . albuterol (PROVENTIL HFA;VENTOLIN HFA) 108 (90 BASE) MCG/ACT inhaler 1-2 puff  1-2 puff Inhalation Q6H PRN Laverle Hobby, PA-C      . alum & mag hydroxide-simeth (MAALOX/MYLANTA) 200-200-20 MG/5ML suspension 30 mL  30 mL Oral Q4H PRN Laverle Hobby, PA-C      . dicyclomine (BENTYL) tablet 20 mg  20 mg Oral Q6H PRN Laverle Hobby, PA-C      . fluticasone (FLONASE) 50 MCG/ACT nasal spray 1 spray  1 spray Each Nare Daily Laverle Hobby, PA-C   1 spray at 05/07/14 [phone]  . glipiZIDE (GLUCOTROL) tablet 10 mg  10 mg Oral QAC breakfast Laverle Hobby, PA-C   10 mg at 05/07/14 2440  . hydrOXYzine (ATARAX/VISTARIL) tablet 25 mg  25 mg Oral Q6H PRN Laverle Hobby, PA-C      . insulin aspart (novoLOG) injection 0-5 Units  0-5 Units Subcutaneous QHS Laverle Hobby, PA-C      . insulin aspart (novoLOG) injection 4 Units  4 Units Subcutaneous TID WC Laverle Hobby, PA-C   4 Units at 05/07/14 [phone]  . insulin detemir (LEVEMIR) injection 40 Units  40 Units Subcutaneous Daily Laverle Hobby, PA-C   40 Units at 05/07/14 2536  . lisinopril (PRINIVIL,ZESTRIL) tablet 10 mg  10 mg Oral Daily Laverle Hobby, PA-C   10 mg at  05/07/14 6440  . loperamide (IMODIUM) capsule 2-4 mg  2-4 mg Oral PRN Laverle Hobby, PA-C      . loratadine (CLARITIN) tablet 10 mg  10 mg Oral Daily Laverle Hobby, PA-C   10 mg at 05/07/14 3474  . magnesium hydroxide (MILK OF MAGNESIA) suspension 30 mL  30 mL Oral Daily PRN Laverle Hobby, PA-C      . metFORMIN (GLUCOPHAGE) tablet 500 mg  500 mg Oral Daily Laverle Hobby, PA-C   500 mg at 05/07/14 2595  . methocarbamol (ROBAXIN) tablet 500 mg  500 mg Oral Q8H PRN Laverle Hobby, PA-C      . naproxen (NAPROSYN) tablet 500 mg  500 mg Oral BID PRN Laverle Hobby, PA-C   500 mg at 05/07/14 3086  . ondansetron (ZOFRAN-ODT) disintegrating tablet 4 mg  4 mg Oral Q6H PRN Laverle Hobby, PA-C      . traZODone (DESYREL) tablet 50 mg  50 mg Oral QHS,MR X 1 Spencer E Simon, PA-C       PTA Medications: Prescriptions prior to admission  Medication Sig Dispense Refill Last Dose  . acetaminophen (TYLENOL) 500 MG tablet Take 1,000 mg by mouth every 6 (six) hours as needed for mild pain.    05/06/2014 at Unknown time  . albuterol (PROVENTIL HFA;VENTOLIN HFA) 108 (90 BASE) MCG/ACT inhaler Inhale 1-2 puffs into the lungs every 6 (six) hours as needed for wheezing or shortness of breath.   05/06/2014 at Unknown time  . fluticasone (FLONASE) 50 MCG/ACT nasal spray Place 1 spray into both nostrils daily.   05/06/2014 at Unknown time  . glipiZIDE (GLUCOTROL) 10 MG tablet Take 10 mg by mouth daily before breakfast.   05/06/2014 at Unknown time  . insulin aspart (NOVOLOG) 100 UNIT/ML injection Inject 4-12 Units into the skin 2 (two) times daily before a meal. Sliding scale   05/06/2014 at Unknown time  . insulin detemir (LEVEMIR) 100 UNIT/ML injection Inject 0.2 mLs (20 Units total) into the skin daily. (Patient taking differently: Inject 40 Units into the skin daily. ) 10 mL 0 05/06/2014 at Unknown time  . lisinopril (PRINIVIL,ZESTRIL) 10 MG tablet Take 10 mg by mouth daily.   05/06/2014 at Unknown time  . loratadine  (CLARITIN) 10 MG tablet Take 10 mg by mouth daily.   05/06/2014 at Unknown time  . metFORMIN (GLUCOPHAGE) 500 MG tablet Take 500 mg by mouth daily.    05/06/2014 at Unknown time  . oxyCODONE (OXY IR/ROXICODONE) 5 MG immediate release tablet Take 1 tablet (5 mg total) by mouth every 6 (six) hours as needed for moderate pain. 30 tablet 0 05/06/2014 at Unknown time    Previous Psychotropic Medications: No   Substance Abuse History in the last 12 months:  Yes.      Consequences of Substance Abuse: Legal Consequences:  "Court now selling crack cocaine"; and other charges. Family Consequences:  Family discord Withdrawal Symptoms:   Tremors worsening depression, anxiety  Results for orders placed or performed during the hospital encounter of 05/07/14 (from the past 72 hour(s))  Glucose, capillary     Status: Abnormal   Collection Time: 05/07/14  6:13 AM  Result Value Ref Range   Glucose-Capillary 277 (H) 70 - 99 mg/dL  TSH     Status: None   Collection Time: 05/07/14  6:45 AM  Result Value Ref Range   TSH 2.561 0.350 - 4.500 uIU/mL    Comment: Performed at [facility]  Lipid panel, fasting     Status: Abnormal   Collection Time: 05/07/14  6:45 AM  Result Value Ref Range   Cholesterol 261 (H) 0 - 200 mg/dL   Triglycerides 242 (H) <150 mg/dL   HDL 50 >39 mg/dL   Total CHOL/HDL Ratio 5.2 RATIO   VLDL 48 (H) 0 - 40 mg/dL   LDL Cholesterol 163 (H) 0 - 99 mg/dL    Comment:        Total Cholesterol/HDL:CHD Risk Coronary Heart Disease Risk Table                     Men   Women  1/2 Average Risk   3.4  3.3  Average Risk       5.0   4.4  2 X Average Risk   9.6   7.1  3 X Average Risk  23.4   11.0        Use the calculated Patient Ratio above and the CHD Risk Table to determine the patient's CHD Risk.        ATP III CLASSIFICATION (LDL):  <100     mg/dL   Optimal  100-129  mg/dL   Near or Above                    Optimal  130-159  mg/dL   Borderline  160-189   mg/dL   High  >190     mg/dL   Very High Performed at [facility]     Observation Level/Precautions:  15 minute checks  Laboratory:  CBC Chemistry Profile UDS UA  Psychotherapy:  Individual and group sessions  Medications:  Will start medications as need for patient stabilization  Consultations:  Psychiatry  Discharge Concerns:  Safety, stabilization, and risk of access to medication and medication stabilization   Estimated LOS:  3-4 days  Other:     Psychological Evaluations: Yes   Treatment Plan Summary: Daily contact with patient to assess and evaluate symptoms and progress in treatment and Medication management   1. Admit for crisis management and stabilization.  2. Medication management to reduce current symptoms to base line and improve the patient's overall level of functioning: Started Zocor 10 mg for hyperlipidemia/Type and DM, HTN 3. Treat health problems as indicated.  4. Develop treatment plan to decrease risk of relapse upon discharge and the need for    readmission.  5. Psycho-social education regarding relapse prevention and self- care.  6. Health care follow up as needed for medical problems.  7. Restart home medications where appropriate.  Medical Decision Making:  Established Problem, Stable/Improving (1), Review of Psycho-Social Stressors (1), Review or order clinical lab tests (1) and Review of New Medication or Change in Dosage (2)  I certify that inpatient services furnished can reasonably be expected to improve the patient's condition.   Earleen Newport, FNP-BC 3/30/201611:09 AM   Have reviewed patient case with NP and have met with patient. Agree with NP's Note and Assessment. 49 year old man, reports that he has had a lot of stressors in his life over the last couple of years. His son passed away from a heart condition in 2014, his stepson was murdered in 2015, his business truck broke down, and he has been facing financial difficulties. Over  the last year he had started drinking and using cocaine , which he states was to deal with the stress. Last used alcohol 4-5 days ago and is not currently presenting with any WDL, last used cocaine 2 days ago. He has been drinking about 6-8 beers 3-4 times a week, and has been using cocaine a few times a week as well. Recently started feeling more depressed and had some suicidal ideations, so that he decided to come to hospital. Dx- Depression NOS, Cocaine Abuse, Alcohol Abuse . Plan - continue inpatient treatment . Agrees to start Zoloft at 50 mgrs QDAY , side effects reviewed

## 2014-05-07 NOTE — ED Notes (Signed)
CBG 202 

## 2014-05-07 NOTE — Progress Notes (Signed)
Pt presents to [facility] alert and cooperative. Pt presented to ED reporting +SI w/plan to cut wrists or crash car and +A/hall telling him to kill himself.  c/o depression, feeling hopeless/helpless and worrying. Report stressors being legal charges with court date 05/26/14 for drug selling, larceny , felony and conspiracy; unemployed, recent failed business; car broke down, son had heart attack and died 2013-03-06; Joslyn Hy was shot and died 04-Jan-2014 "It keeps getting worse". Currently denies SI/HI, -A/[place], verbally contracts for safety. Pt reports to writer hallucinations "only when using drugs".  Pt admits to daily alcohol, cocaine and marijuana use.  Hx HTN, IDDM, sleep apnea and right ear HOH "they said maybe pinched nerve".  c/o headache, receiving RN made aware. Pt admitted for evaluation, stabilization and reduction of baseline. Will monitor closely.

## 2014-05-07 NOTE — ED Provider Notes (Signed)
Accepted to [facility] by dr. Sabra Heck.   Jonathan Pert, MD 05/07/14 [phone]

## 2014-05-07 NOTE — Progress Notes (Signed)
D: Patient denies SI/HI and A/V hallucinations; patient reports sleep is poor due to the fact that he did not have his CPAP machine; reports appetite is good; reports energy level is low ; reports ability to concentrate is good; rates depression as 8/10; rates hopelessness 8/10; rates anxiety as 8/10; patient reports generalized pain  A: Monitored q 15 minutes; patient encouraged to attend groups; patient educated about medications; patient given medications per physician orders; patient encouraged to express feelings and/or concerns  R: Patient is cooperative and pleasant; patient is flat and sad; patient's interaction with staff and peers is minimal; patient was able to set goal to talk with staff 1:1 when having feelings of SI; patient is taking medications as prescribed and tolerating medications; patient is attending some groups

## 2014-05-07 NOTE — Tx Team (Signed)
Interdisciplinary Treatment Plan Update (Adult)   Date: 05/07/2014   Time Reviewed: 8:27 AM  Progress in Treatment:  Attending groups: Yes  Participating in groups:  Yes  Taking medication as prescribed: Yes  Tolerating medication: Yes  Family/Significant othe contact made: Not yet. SPE required for this pt.   Patient understands diagnosis: Yes, AEB seeking treatment for SI, depression/mood instability, polysubstance abuse (alcohol, crack cocaine, and marijuana), and for medication stabilization.  Discussing patient identified problems/goals with staff: Yes  Medical problems stabilized or resolved: Yes  Denies suicidal/homicidal ideation: Yes, during group/self report.  Patient has not harmed self or Others: Yes  New problem(s) identified:  Discharge Plan or Barriers: Pt plans to return home and is agreeable to follow-up at [facility] for mental health/substance abuse outpatient needs. He wants to d/c by Thursday and pt was encouraged to speak with MD about this. Pt anxious to return to work. Reporting minimal withdrawal symptoms.  Additional comments:Jonathan Austin is a 49 y.o. male who voluntarily presents to [facility] with Depression/SI/SA. Pt states he's been SI x 1 month with a plan to cut wrists or crash car. Pt currently denies a plan or intent to harm self. Pt says his current mental state is triggered by (1) financial; (2) death of 2 sons in 54; (3) family stressors; (4) chronic SA; (5) unemployment. Pt admits 2 previous SI attempts by cutting writs and overdose on illegal drugs. Pt says he hears voices(intermittent) w/command to harm himself--"go ahead and get this over with". Pt reports to this writer that he uses drugs, daily: (1) he uses 2 grams of crack/cocaine, daily; his last use was 05/03/14 prior to arrival to [facility], he used 1 gram; (2) he drinks 3-40's, daily, his last drink was 05/05/14 and drank 4-40oz beers and (3) he smokes 2 joints, daily, his last use was 05/05/14, he smoked 2  joints. He says due to his chronic drug use, he has an upcoming court date for drug charges on 05/26/14. Pt denies w/d sxs and has no issues with seizures/blackouts. Pt is not prescribed any psych meds.    Reason for Continuation of Hospitalization: Depression/mood instability Medication stabilization Estimated length of stay: 1-3 days  For review of initial/current patient goals, please see plan of care.  Attendees:  Patient:    Family:    Physician: Dr. Parke Poisson MD 05/07/2014 8:27 AM   Nursing: Dagoberto Reef MD 05/07/2014 8:27 AM   Clinical Social Worker [place]  05/07/2014 8:27 AM   Other: Caryn Bee. LCSWLeana Gamer 05/07/2014 8:27 AM   Other: Gerline Legacy Nurse CM 05/07/2014 8:27 AM   Other: Hilda Lias, Community Care Coordinator  05/07/2014 8:27 AM   Other:  05/07/2014 8:27 AM   Scribe for Treatment Team:  Nira Conn Smart LCSWA 05/07/2014 8:27 AM

## 2014-05-07 NOTE — Tx Team (Signed)
Initial Interdisciplinary Treatment Plan   PATIENT STRESSORS: Financial difficulties Health problems Legal issue Loss of sons Marital or family conflict Occupational concerns Substance abuse   PATIENT STRENGTHS: Ability for insight Capable of independent living Communication skills Motivation for treatment/growth Supportive family/friends   PROBLEM LIST: Problem List/Patient Goals Date to be addressed Date deferred Reason deferred Estimated date of resolution  Substance abuse 05/07/14   At d/c  Depression "to get better" 05/07/14   At d/c  Suicidal ideation  05/07/14   At d/c                                       DISCHARGE CRITERIA:  Improved stabilization in mood, thinking, and/or behavior Motivation to continue treatment in a less acute level of care Need for constant or close observation no longer present Reduction of life-threatening or endangering symptoms to within safe limits Verbal commitment to aftercare and medication compliance Withdrawal symptoms are absent or subacute and managed without 24-hour nursing intervention  PRELIMINARY DISCHARGE PLAN: Outpatient therapy Return to previous living arrangement  PATIENT/FAMIILY INVOLVEMENT: This treatment plan has been presented to and reviewed with the patient, [name].  The patient and family have been given the opportunity to ask questions and make suggestions.  [name] 05/07/2014, 3:27 AM

## 2014-05-07 NOTE — BHH Group Notes (Signed)
[place] LCSW Group Therapy  05/07/2014 3:14 PM  Type of Therapy:  Group Therapy  Participation Level:  Minimal   Participation Quality:  Drowsy  Affect:  Lethargic  Cognitive:  Lacking  Insight:  Limited  Engagement in Therapy:  Limited  Modes of Intervention:  Confrontation, Discussion, Education, Exploration, Problem-solving, Rapport Building, Socialization and Support  Summary of Progress/Problems: Emotion Regulation: This group focused on both positive and negative emotion identification and allowed group members to process ways to identify feelings, regulate negative emotions, and find healthy ways to manage internal/external emotions. Group members were asked to reflect on a time when their reaction to an emotion led to a negative outcome and explored how alternative responses using emotion regulation would have benefited them. Group members were also asked to discuss a time when emotion regulation was utilized when a negative emotion was experienced. Erian was inattentive and slept during today's processing group. Pt reports that medication has made him sleepy today. He does not show progress in the group setting at this time and apologized for his inability to remain awake.   Smart, Meryn Sarracino LCSWA 05/07/2014, 3:14 PM

## 2014-05-07 NOTE — Progress Notes (Signed)
Recreation Therapy Notes  Date: 03.30.2016 Time: 9:30am Location: [address] Group Room   Group Topic: Stress Management  Goal Area(s) Addresses:  Patient will actively participate in stress management techniques presented during session.   Behavioral Response: Did not attend.   Laureen Ochs Aurel Nguyen, LRT/CTRS  Addeline Calarco L 05/07/2014 3:00 PM

## 2014-05-07 NOTE — BH Assessment (Signed)
Tele Assessment Note   Jonathan Austin is a 49 y.o. male who voluntarily presents to [facility] with Depression/SI/SA.  Pt states he's been SI x 1 month with a plan to cut wrists or crash car. Pt currently denies a plan or intent to harm self.  Pt says his current mental state is triggered by (1) financial; (2) death of 2 sons in 78; (3) family stressors; (4) chronic SA; (5) unemployment.  Pt admits 2 previous SI attempts by cutting writs and overdose on illegal drugs. Pt says he hears voices(intermittent) w/command to harm himself--"go ahead and get this over with".    Pt reports to this writer that he uses drugs, daily: (1) he uses 2 grams of crack/cocaine, daily; his last use was 05/03/14 prior to arrival to [facility], he used 1 gram; (2) he drinks 3-40's, daily, his last drink was 05/05/14 and drank 4-40oz beers and (3) he smokes 2 joints, daily, his last use was 05/05/14, he smoked 2 joints. He says due to his chronic drug use, he has an upcoming court date for drug charges on 05/26/14.  Pt denies w/d sxs and has no issues with seizures/blackouts.  Pt told this writer that he is not prescribed any psych meds.     Axis I: Major depressive disorder, Recurrent episode, With psychotic features;Cocaine use disorder, Severe; Alcohol use disorder, Severe; Cannabis use disorder, Mild  Axis II: Deferred Axis III:  Past Medical History  Diagnosis Date  . Diabetes mellitus   . Sleep apnea   . Depression    Axis IV: other psychosocial or environmental problems, problems related to social environment and problems with primary support group Axis V: 31-40 impairment in reality testing  Past Medical History:  Past Medical History  Diagnosis Date  . Diabetes mellitus   . Sleep apnea   . Depression     Past Surgical History  Procedure Laterality Date  . Tonsillectomy      Family History:  Family History  Problem Relation Age of Onset  . Hypertension Mother   . Hypertension Father   . Diabetes  Father     Social History:  reports that he has been smoking Cigars.  He does not have any smokeless tobacco history on file. He reports that he drinks about 1.8 oz of alcohol per week. He reports that he uses illicit drugs (Cocaine and Marijuana).  Additional Social History:  Alcohol / Drug Use Pain Medications: See MAR  Prescriptions: See MAR  Over the Counter: See MAR  History of alcohol / drug use?: Yes Longest period of sobriety (when/how long): Only when in detox  Negative Consequences of Use: Work / Youth worker, Charity fundraiser relationships, Scientist, research (physical sciences), Museum/gallery curator Withdrawal Symptoms: Other (Comment) (No current w/d sxs ) Substance #1 Name of Substance 1: Crack Cocaine  1 - Age of First Use: 24 YOM  1 - Amount (size/oz): 2 Grams  1 - Frequency: Daily  1 - Duration: On-going  1 - Last Use / Amount: 05/06/14 Substance #2 Name of Substance 2: Alcohol  2 - Age of First Use: 16 YOM  2 - Amount (size/oz): 3-40's  2 - Frequency: Daily  2 - Duration: On-going  2 - Last Use / Amount: 05/06/14 Substance #3 Name of Substance 3: THC  3 - Age of First Use: 16 YOM  3 - Amount (size/oz): 2 Joints  3 - Frequency: Daily  3 - Duration: On-going  3 - Last Use / Amount: 05/06/14  CIWA: CIWA-Ar BP: 127/95 mmHg Pulse  Rate: 94 COWS:    PATIENT STRENGTHS: (choose at least two) Motivation for treatment/growth Supportive family/friends  Allergies: No Known Allergies  Home Medications:  (Not in a hospital admission)  OB/GYN Status:  No LMP for male patient.  General Assessment Data [facility] of Assessment[facility] ED Is this a Tele or Face-to-Face Assessment?: Tele Assessment Is this an Initial Assessment or a Re-assessment for this encounter?: Initial Assessment Living Arrangements: Spouse/significant other (Lives with Spouse ) Can pt return to current living arrangement?: Yes Admission Status: Voluntary Is patient capable of signing voluntary admission?: Yes Transfer from: Home Referral Source:  Self/Family/Friend  Medical Screening Exam ([place]) Medical Exam completed: No Reason for MSE not completed: Other:  [place] Living Arrangements: Spouse/significant other (Lives with Spouse ) Name of Psychiatrist: None  Name of Therapist: none   Education Status Is patient currently in school?: No Current Grade: None  Highest grade of school patient has completed: None  Name of school: [employer]  Contact person: None   Risk to self with the past 6 months Suicidal Ideation: Yes-Currently Present Suicidal Intent: No-Not Currently/Within Last 6 Months Is patient at risk for suicide?: Yes Suicidal Plan?: Yes-Currently Present Specify Current Suicidal Plan: Cut wrists or crash vehicle  Access to Means: Yes Specify Access to Suicidal Means: [place], Car What has been your use of drugs/alcohol within the last 12 months?: Abusing: alcohol, crack, thc  Previous Attempts/Gestures: Yes How many times?: 2 Other Self Harm Risks: None  Triggers for Past Attempts: Family contact, Unpredictable (Depression ) Intentional Self Injurious Behavior: None Family Suicide History:  (Uncle completed SI and Aunt attempted SI ) Recent stressful life event(s): Financial Problems, Job Loss, Loss (Comment), Trauma (Comment) (2 sons died in 82, [place]) Depression: Yes Depression Symptoms: Loss of interest in usual pleasures, Feeling worthless/self pity, Insomnia, Isolating Substance abuse history and/or treatment for substance abuse?: Yes Suicide prevention information given to non-admitted patients: Not applicable  Risk to Others within the past 6 months Homicidal Ideation: No Thoughts of Harm to Others: No Current Homicidal Intent: No Current Homicidal Plan: No Access to Homicidal Means: No Identified Victim: None  History of harm to others?: No Assessment of Violence: None Noted Violent Behavior Description: None  Does patient have access to weapons?: No Criminal Charges  Pending?: Yes Describe Pending Criminal Charges: Drugs Charges  Does patient have a court date: Yes Court Date: 05/26/14  Psychosis Hallucinations: Auditory, With command Delusions: None noted  Mental Status Report Appearance/Hygiene: In hospital gown Eye Contact: Good Motor Activity: Unremarkable Speech: Logical/coherent, Soft Level of Consciousness: Alert, Quiet/awake Mood: Depressed, Helpless Affect: Depressed, Sad, Flat Anxiety Level: None Thought Processes: Coherent, Relevant Judgement: Impaired Orientation: Person, Place, Time, Situation Obsessive Compulsive Thoughts/Behaviors: None  Cognitive Functioning Concentration: Decreased Memory: Recent Intact, Remote Intact IQ: Average Insight: Poor Impulse Control: Poor Appetite: Good Weight Loss: 0 Weight Gain: 0 Sleep: Decreased Total Hours of Sleep: 4 Vegetative Symptoms: None  ADLScreening [facility] Assessment Services) Patient's cognitive ability adequate to safely complete daily activities?: Yes Patient able to express need for assistance with ADLs?: Yes Independently performs ADLs?: Yes (appropriate for developmental age)  Prior Inpatient Therapy Prior Inpatient Therapy: Yes Prior Therapy Dates: 1994, 2012 Prior Therapy Facilty/Provider(s): [place], [place] facility, [facility] Reason for Treatment: SI/Attempt  Prior Outpatient Therapy Prior Outpatient Therapy: Yes Prior Therapy Dates: 1991, 2012, 2013 Prior Therapy Facilty/Provider(s): [facility], [facility], [facility] Reason for Treatment: Med mgnt  ADL Screening (condition at time of  admission) Patient's cognitive ability adequate to safely complete daily activities?: Yes Is the patient deaf or have difficulty hearing?: No Does the patient have difficulty seeing, even when wearing glasses/contacts?: No Does the patient have difficulty concentrating, remembering, or making decisions?: Yes Patient able to express need for assistance with  ADLs?: Yes Does the patient have difficulty dressing or bathing?: No Independently performs ADLs?: Yes (appropriate for developmental age) Does the patient have difficulty walking or climbing stairs?: No Weakness of Legs: None Weakness of Arms/Hands: None  Home Assistive Devices/Equipment Home Assistive Devices/Equipment: None  Therapy Consults (therapy consults require a physician order) PT Evaluation Needed: No OT Evalulation Needed: No SLP Evaluation Needed: No Abuse/Neglect Assessment (Assessment to be complete while patient is alone) Physical Abuse: Denies Verbal Abuse: Denies Sexual Abuse: Denies Exploitation of patient/patient's resources: Denies Self-Neglect: Denies Values / Beliefs Cultural Requests During Hospitalization: None Spiritual Requests During Hospitalization: None Consults Spiritual Care Consult Needed: No Social Work Consult Needed: No Regulatory affairs officer (For Healthcare) Does patient have an advance directive?: No Would patient like information on creating an advanced directive?: No - patient declined information    Additional Information 1:1 In Past 12 Months?: No CIRT Risk: No Elopement Risk: No Does patient have medical clearance?: Yes     Disposition:  Disposition Initial Assessment Completed for this Encounter: Yes Disposition of Patient: Referred to, Inpatient treatment program (Per Patriciaann Clan, PA meets criteria for inpt admission ) Type of inpatient treatment program: Adult Patient referred to: Other (Comment) (Per Patriciaann Clan, PA meets criteria for inpt admission )  [place]a Rm 05/07/2014 12:08 AM

## 2014-05-07 NOTE — BHH Group Notes (Signed)
[facility] LCSW Aftercare Discharge Planning Group Note   05/07/2014 10:01 AM  Participation Quality:  Appropriate   Mood/Affect:  Appropriate  Depression Rating:  0  Anxiety Rating:  4-5  Thoughts of Suicide:  No Will you contract for safety?   NA  Current AVH:  No  Plan for Discharge/Comments:  Pt reports that he decided to come to the hospital after getting intoxicated and experiencing SI. Pt reports multiple stressors at home and increased depression/anxiety. Pt hoping to be put on psych medication and to be detoxed. Pt agreeable to [facility] referral and plans to return home. Pt hoping to d/c Thursday--CSW told pt that typical stay is 3-5 days but to speak with MD about his concerns regarding d/c.   Transportation Means: wife or bus   Supports: wife and family supports   Proofreader, [place]
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
## 2014-05-08 DIAGNOSIS — F1994 Other psychoactive substance use, unspecified with psychoactive substance-induced mood disorder: Secondary | ICD-10-CM

## 2014-05-08 DIAGNOSIS — F1414 Cocaine abuse with cocaine-induced mood disorder: Principal | ICD-10-CM

## 2014-05-08 DIAGNOSIS — F1010 Alcohol abuse, uncomplicated: Secondary | ICD-10-CM

## 2014-05-08 LAB — HEMOGLOBIN A1C
HEMOGLOBIN A1C: 11.9 % — AB (ref 4.8–5.6)
MEAN PLASMA GLUCOSE: 295 mg/dL

## 2014-05-08 LAB — GLUCOSE, CAPILLARY
GLUCOSE-CAPILLARY: 332 mg/dL — AB (ref 70–99)
Glucose-Capillary: 249 mg/dL — ABNORMAL HIGH (ref 70–99)

## 2014-05-08 MED ORDER — HYDROXYZINE HCL 25 MG PO TABS: 25.0000 mg | ORAL_TABLET | Freq: Four times a day (QID) | ORAL | Status: DC | PRN

## 2014-05-08 MED ORDER — SERTRALINE HCL 50 MG PO TABS: 50.0000 mg | ORAL_TABLET | Freq: Every day | ORAL | Status: DC

## 2014-05-08 MED ORDER — TRAZODONE HCL 100 MG PO TABS
100.0000 mg | ORAL_TABLET | Freq: Every evening | ORAL | Status: DC | PRN
  Filled 2014-05-08: qty 8
  Filled 2014-05-08: qty 1
  Filled 2014-05-08: qty 8
  Filled 2014-05-08: qty 1

## 2014-05-08 MED ORDER — SIMVASTATIN 10 MG PO TABS: 10.0000 mg | ORAL_TABLET | Freq: Every day | ORAL | Status: DC

## 2014-05-08 MED ORDER — TRAZODONE HCL 100 MG PO TABS: 100.0000 mg | ORAL_TABLET | Freq: Every evening | ORAL | Status: DC | PRN

## 2014-05-08 NOTE — Progress Notes (Signed)
Discharge note: Pt received both written and verbal discharge instructions. Pt verbalized understanding of discharge instructions. Pt agreed to f/u appt and med regimen. Pt received clothing and envelope that was brought in by wife. Pt received belongings from room and locker. Pt received sample meds and prescriptions. Pt denies suicidal thoughts at time of discharge. Pt safely left [facility] to ride public transportation. Pt safely left.

## 2014-05-08 NOTE — Progress Notes (Signed)
Pt complained of body aches rating them a 9/10.  Prn Naprosyn given with some relief.  Pt shared his wife would not be able to bring his CPAP from home as she has to work.  Pt appropriate and cooperative.  Pt denies SI/HI/AVH and contracts for safety.  Pt remains safe on the unit.

## 2014-05-08 NOTE — [facility] Suicide Risk Assessment (Signed)
[facility] INPATIENT:  Family/Significant Other Suicide Prevention Education  Suicide Prevention Education:  Education Completed; Jonathan Austin (pt's wife) [phone] has been identified by the patient as the family member/significant other with whom the patient will be residing, and identified as the person(s) who will aid the patient in the event of a mental health crisis (suicidal ideations/suicide attempt).  With written consent from the patient, the family member/significant other has been provided the following suicide prevention education, prior to the and/or following the discharge of the patient.  The suicide prevention education provided includes the following:  Suicide risk factors  Suicide prevention and interventions  National Suicide Hotline telephone number  [facility] assessment telephone number  [facility] Emergency Assistance [name] and/or Residential Mobile Crisis Unit telephone number  Request made of family/significant other to:  Remove weapons (e.g., guns, rifles, knives), all items previously/currently identified as safety concern.    Remove drugs/medications (over-the-counter, prescriptions, illicit drugs), all items previously/currently identified as a safety concern.  The family member/significant other verbalizes understanding of the suicide prevention education information provided.  The family member/significant other agrees to remove the items of safety concern listed above.  Smart, Rowena Moilanen LCSWA 05/08/2014, 10:33 AM

## 2014-05-08 NOTE — Discharge Summary (Signed)
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 Use most savings to bury sons; now having to buy car parts; could find tools to fix car. Everything just came down on me so I went to a friend to get high, weed, beer, cocaine. Then I was saying I couldn't do this to my wife; we've been together for two years and saying I can't do this to her. I started having the thoughts that I would just kill my self by cutting my wrist; that is why I came to the hospital to get some help. While in the hospital coming down off my high I started feeling better and thoughts started changing.  States that he has since spoke to his wife while in the hospital and is now feeling better. States "Even thought things are little bad with the business we still got other things going on and it's not as bad as I'm making it." Patient states "I always have had a problem with failure and when it happens I just go off the limb; 4-5 years ago the same thing happened. I did try to cut my wrist but I couldn't do it." States that he has only been in hospital for substance abuse. "My problem Was I didn't to my follow ups." Patient denies history of psychotropic medications. Patient states that he is usually stable person and keeps busy; States that he doesn't have extreme anxiety of depression.  Patient state that he use crack  cocaine and mariajuana, "alcohol I can do 2-3 times a week." Denies withdrawal symptoms when not drinking alcohol; also denies history of seizures/DT's. At this time patient denies suicidal/homicidal ideation, psychosis, and paranoia.  Patient states that his biggest problem is his drug use and then feeling overwhelmed when everything seemed to fall apart. After getting intoxicated started having the suicidal thoughts. States that he feels better since talking to his wife and he feels that he would do better with outpatient services that he can continue to follow up with.  Patient states that he would like to be discharge and just set up with out patient services.    Principal Problem: Cocaine abuse with cocaine-induced mood disorder Discharge Diagnoses: Patient Active Problem List   Diagnosis Date Noted  . Substance induced mood disorder [F19.94] 05/07/2014  . Cocaine abuse with cocaine-induced mood disorder [F14.14]   . Alcohol abuse [F10.10]   . Suicidal ideations [R45.851]   . Acute respiratory failure [J96.00] 02/12/2014  . Fever [R50.9] 02/12/2014  . Sinus tachycardia [I47.1] 02/12/2014  . Cellulitis [L03.90] 06/28/2013  . Scrotal infection [N49.2] 06/28/2013  . Diabetes mellitus, type 2 [E11.9] 06/28/2013  . Cocaine abuse [F14.10] 06/28/2013  . Marijuana abuse [F12.10] 06/28/2013  . Leukocytosis [D72.829] 06/28/2013  . Hyponatremia [E87.1] 06/28/2013  . CAP (community acquired pneumonia) [J18.9] 12/09/2012  . Controlled diabetes mellitus type  II without complication [S85.4] 62/70/3500    Musculoskeletal: Strength & Muscle Tone: within normal limits Gait & Station: normal Patient leans: N/A  Psychiatric Specialty Exam: Physical Exam  Constitutional: He is oriented to person, place, and time.  Neck: Normal range of motion.  Respiratory: Effort normal.  Musculoskeletal: Normal range of motion.  Neurological: He is alert and oriented to person, place, and time.     Review of Systems  Psychiatric/Behavioral: Negative for suicidal ideas, hallucinations and memory loss. Depression: Stable. Substance abuse: Cocaine. Nervous/anxious: Stable. Insomnia: Stable.     Blood pressure 126/79, pulse 95, temperature 97.6 F (36.4 C), temperature source Oral, resp. rate 20, height 5\' 7"  (1.702 m), weight 103.42 kg (228 lb).Body mass index is 35.7 kg/(m^2).   Past Medical History:  Past Medical History  Diagnosis Date  . Diabetes mellitus   . Sleep apnea   . Depression     Past Surgical History  Procedure Laterality Date  . Tonsillectomy     Family History:  Family History  Problem Relation Age of Onset  . Hypertension Mother   . Hypertension Father   . Diabetes Father    Social History:  History  Alcohol Use  . 1.8 oz/week  . 1 Glasses of wine, 1 Cans of beer, 1 Shots of liquor per week    Comment: Occasionally     History  Drug Use  . Yes  . Special: Cocaine, Marijuana    History   Social History  . Marital Status: Married    Spouse Name: N/A  . Number of Children: N/A  . Years of Education: N/A   Social History Main Topics  . Smoking status: Current Some Day Smoker    Types: Cigars  . Smokeless tobacco: Not on file  . Alcohol Use: 1.8 oz/week    1 Glasses of wine, 1 Cans of beer, 1 Shots of liquor per week     Comment: Occasionally  . Drug Use: Yes    Special: Cocaine, Marijuana  . Sexual Activity: Yes   Other Topics Concern  . None   Social History Narrative    Risk to Self: Is patient at risk for suicide?: Yes What has been your use of drugs/alcohol within the last 12 months?: alcohol: 2 40oz beers daily. on weekends: 2 40oz beers and 6 pk daily on average. crack cocaine: 1-2 grams "every few days." pt reports no period of sobriety in past 3-4 years. occassional marijuana use.  Risk to Others:   Prior Inpatient Therapy:   Prior Outpatient Therapy:    Level of Care:  OP  Hospital Course:  [name] was admitted  for Cocaine abuse with cocaine-induced mood disorder and crisis management.  He was treated discharged with the medications listed below under Medication List.  Medical problems were identified and treated as needed.  Home medications were restarted as appropriate.  Improvement was monitored by observation and [name] daily report of symptom reduction.  Emotional and mental status was monitored by daily self-inventory reports completed by [name] and clinical staff.         [name] was evaluated by the treatment team for stability and plans for continued recovery upon discharge.  [name] motivation was an integral factor for scheduling further treatment.  Employment, transportation, bed availability, health status, family support, and any pending legal issues were also considered during his hospital stay.  He was offered further treatment options upon discharge including but not limited to Residential, Intensive Outpatient,  and Outpatient treatment.  [name] will follow up with the services as listed below under Follow Up Information.     Upon completion of this admission the patient was both mentally and medically stable for discharge denying suicidal/homicidal ideation, auditory/visual/tactile hallucinations, delusional thoughts and paranoia.      Consults:  psychiatry  Significant Diagnostic Studies:  labs: CMET, ETOH, UDS, HgbA1c, TSH, lipid panel, I-Stat Troponin  Discharge Vitals:   Blood pressure 126/79, pulse 95, temperature 97.6 F (36.4 C), temperature source Oral, resp. rate 20, height 5\' 7"  (1.702 m), weight 103.42 kg (228 lb). Body mass index is 35.7 kg/(m^2). Lab Results:   Results for orders placed or performed during the hospital encounter of 05/07/14 (from the past 72 hour(s))  Glucose, capillary     Status: Abnormal   Collection Time: 05/07/14  6:13 AM  Result Value Ref Range   Glucose-Capillary 277 (H) 70 - 99 mg/dL  Hemoglobin A1c      Status: Abnormal   Collection Time: 05/07/14  6:45 AM  Result Value Ref Range   Hgb A1c MFr Bld 11.9 (H) 4.8 - 5.6 %    Comment: (NOTE)         Pre-diabetes: 5.7 - 6.4         Diabetes: >6.4         Glycemic control for adults with diabetes: <7.0    Mean Plasma Glucose 295 mg/dL    Comment: (NOTE) Performed At: [facility] [place], [place] [id number] Lindon Romp MD IR:[id number] Performed at [facility]   TSH     Status: None   Collection Time: 05/07/14  6:45 AM  Result Value Ref Range   TSH 2.561 0.350 - 4.500 uIU/mL    Comment: Performed at [facility]  Lipid panel, fasting     Status: Abnormal   Collection Time: 05/07/14  6:45 AM  Result Value Ref Range   Cholesterol 261 (H) 0 - 200 mg/dL   Triglycerides 242 (H) <150 mg/dL   HDL 50 >39 mg/dL   Total CHOL/HDL Ratio 5.2 RATIO   VLDL 48 (H) 0 - 40 mg/dL   LDL Cholesterol 163 (H) 0 - 99 mg/dL    Comment:        Total Cholesterol/HDL:CHD Risk Coronary Heart Disease Risk Table                     Men   Women  1/2 Average Risk   3.4   3.3  Average Risk       5.0   4.4  2 X Average Risk   9.6   7.1  3 X Average Risk  23.4   11.0        Use the calculated Patient Ratio above and the CHD Risk Table to determine the patient's CHD Risk.        ATP III CLASSIFICATION (LDL):  <100     mg/dL   Optimal  100-129  mg/dL   Near or Above                    Optimal  130-159  mg/dL   Borderline  160-189  mg/dL   High  >190     mg/dL   Very High Performed at [facility]   Glucose, capillary     Status: Abnormal   Collection Time: 05/07/14 11:51 AM  Result Value Ref Range   Glucose-Capillary 273 (H)  70 - 99 mg/dL   Comment 1 Notify RN   Glucose, capillary     Status: Abnormal   Collection Time: 05/07/14  4:39 PM  Result Value Ref Range   Glucose-Capillary 302 (H) 70 - 99 mg/dL  Glucose, capillary     Status: Abnormal   Collection Time: 05/07/14  9:11  PM  Result Value Ref Range   Glucose-Capillary 334 (H) 70 - 99 mg/dL  Glucose, capillary     Status: Abnormal   Collection Time: 05/08/14  6:09 AM  Result Value Ref Range   Glucose-Capillary 249 (H) 70 - 99 mg/dL  Glucose, capillary     Status: Abnormal   Collection Time: 05/08/14 11:53 AM  Result Value Ref Range   Glucose-Capillary 332 (H) 70 - 99 mg/dL    Physical Findings: AIMS: Facial and Oral Movements Muscles of Facial Expression: None, normal Lips and Perioral Area: None, normal Jaw: None, normal Tongue: None, normal,Extremity Movements Upper (arms, wrists, hands, fingers): None, normal Lower (legs, knees, ankles, toes): None, normal, Trunk Movements Neck, shoulders, hips: None, normal, Overall Severity Severity of abnormal movements (highest score from questions above): None, normal Incapacitation due to abnormal movements: None, normal Patient's awareness of abnormal movements (rate only patient's report): No Awareness, Dental Status Current problems with teeth and/or dentures?: No Does patient usually wear dentures?: No  CIWA:  CIWA-Ar Total: 5 COWS:  COWS Total Score: 1   See Psychiatric Specialty Exam and Suicide Risk Assessment completed by Attending Physician prior to discharge.  Discharge destination:  Home  Is patient on multiple antipsychotic therapies at discharge:  No   Has Patient had three or more failed trials of antipsychotic monotherapy by history:  No    Recommended Plan for Multiple Antipsychotic Therapies: NA      Discharge Instructions    Activity as tolerated - No restrictions    Complete by:  As directed      Diet general    Complete by:  As directed      Discharge instructions    Complete by:  As directed   Take all of you medications as prescribed by your mental healthcare provider.  Report any adverse effects and reactions from your medications to your outpatient provider promptly. Do not engage in alcohol and or illegal drug use  while on prescription medicines. In the event of worsening symptoms call the crisis hotline, 911, and or go to the nearest emergency department for appropriate evaluation and treatment of symptoms. Follow-up with your primary care provider for your medical issues, concerns and or health care needs.   Keep all scheduled appointments.  If you are unable to keep an appointment call to reschedule.  Let the nurse know if you will need medications before next scheduled appointment.            Medication List    STOP taking these medications        acetaminophen 500 MG tablet  Commonly known as:  TYLENOL     oxyCODONE 5 MG immediate release tablet  Commonly known as:  Oxy IR/ROXICODONE      TAKE these medications      Indication   albuterol 108 (90 BASE) MCG/ACT inhaler  Commonly known as:  PROVENTIL HFA;VENTOLIN HFA  Inhale 1-2 puffs into the lungs every 6 (six) hours as needed for wheezing or shortness of breath.      fluticasone 50 MCG/ACT nasal spray  Commonly known as:  FLONASE  Place 1 spray into both  nostrils daily.      glipiZIDE 10 MG tablet  Commonly known as:  GLUCOTROL  Take 10 mg by mouth daily before breakfast.      hydrOXYzine 25 MG tablet  Commonly known as:  ATARAX/VISTARIL  Take 1 tablet (25 mg total) by mouth every 6 (six) hours as needed for anxiety.   Indication:  anxiety     insulin aspart 100 UNIT/ML injection  Commonly known as:  novoLOG  Inject 4-12 Units into the skin 2 (two) times daily before a meal. Sliding scale      insulin detemir 100 UNIT/ML injection  Commonly known as:  LEVEMIR  Inject 0.2 mLs (20 Units total) into the skin daily.      lisinopril 10 MG tablet  Commonly known as:  PRINIVIL,ZESTRIL  Take 10 mg by mouth daily.      loratadine 10 MG tablet  Commonly known as:  CLARITIN  Take 10 mg by mouth daily.      metFORMIN 500 MG tablet  Commonly known as:  GLUCOPHAGE  Take 500 mg by mouth daily.      sertraline 50 MG tablet   Commonly known as:  ZOLOFT  Take 1 tablet (50 mg total) by mouth daily. For depression   Indication:  Depression     simvastatin 10 MG tablet  Commonly known as:  ZOCOR  Take 1 tablet (10 mg total) by mouth daily at 6 PM. For Hyperlipidemia   Indication:  Type II A Hyperlipidemia, Hyperlipidema     traZODone 100 MG tablet  Commonly known as:  DESYREL  Take 1 tablet (100 mg total) by mouth at bedtime as needed for sleep.   Indication:  Trouble Sleeping       Follow-up Information    [facility] On 05/12/2014.   Why:  Appt with Roselyn Reef on this date at [place] on this date to get set up for medication management/therapy/SAIOP. Please bring photo ID with you to this appt.    Contact information:   [address] [place], [place] [address] Phone: [phone] Fax: [phone]      Follow-up recommendations:  Activity:  As tolerated Diet:  As tolerated  Comments:   Patient has been instructed to take medications as prescribed; and report adverse effects to outpatient provider.  Follow up with primary doctor for any medical issues and If symptoms recur report to nearest emergency or crisis hot line.    Total Discharge Time: Greater than 30 minutes  Signed: Earleen Newport, FNP-BC 05/08/2014, 1:52 PM   Patient seen face-to-face for psychiatric evaluation, chart reviewed and case discussed with the physician extender and developed treatment plan. Reviewed the information documented and agree with the treatment plan. Corena Pilgrim, MD

## 2014-05-08 NOTE — Progress Notes (Signed)
  [facility] Adult Case Management Discharge Plan :  Will you be returning to the same living situation after discharge:  Yes,  home with his wife At discharge, do you have transportation home?: Yes,  bus pass in chart. Do you have the ability to pay for your medications: Yes,  mental health  Release of information consent forms completed and submitted to Medical Records by CSW.  Patient to Follow up at: Follow-up Information    [facility] with [facility] On 05/12/2014.   Why:  Appt with Roselyn Reef on this date at [place] on this date to get set up for medication management/therapy/SAIOP. Please bring photo ID with you to this appt.    Contact information:   [address] [place], [place] [address] Phone: [phone] Fax: [phone]      Patient denies SI/HI: Yes,  during group/self report.     Safety Planning and Suicide Prevention discussed: Yes,  SPE completed with pt's wife. SPI pamphlet provided to pt and he was encouraged to share information with support network, ask questions, and talk about any concerns relating to SPE.  Have you used any form of tobacco in the last 30 days? (Cigarettes, Smokeless Tobacco, Cigars, and/or Pipes): No  Has patient been referred to the Quitline?: N/A patient is not a smoker  Smart, [place]  05/08/2014, 10:34 AM

## 2014-05-08 NOTE — [facility] Suicide Risk Assessment (Signed)
[facility] Discharge Suicide Risk Assessment   Demographic Factors:  Male and SELF EMPLOYED  Total Time spent with patient: 30 minutes  Musculoskeletal: Strength & Muscle Tone: within normal limits Gait & Station: normal Patient leans: N/A  Psychiatric Specialty Exam: Physical Exam  Psychiatric: He has a normal mood and affect. His speech is normal and behavior is normal. Judgment and thought content normal. Cognition and memory are normal.    Review of Systems  Constitutional: Negative.   HENT: Negative.   Eyes: Negative.   Respiratory: Negative.   Cardiovascular: Negative.   Gastrointestinal: Negative.   Genitourinary: Negative.   Musculoskeletal: Negative.   Skin: Negative.   Neurological: Negative.   Endo/Heme/Allergies: Negative.   Psychiatric/Behavioral: Negative.     Blood pressure 126/79, pulse 95, temperature 97.6 F (36.4 C), temperature source Oral, resp. rate 20, height 5\' 7"  (1.702 m), weight 103.42 kg (228 lb).Body mass index is 35.7 kg/(m^2).  General Appearance: Casual  Eye Contact::  Good  Speech:  Normal Rate409  Volume:  Normal  Mood:  Euthymic  Affect:  Appropriate  Thought Process:  Goal Directed  Orientation:  Full (Time, Place, and Person)  Thought Content:  Negative  Suicidal Thoughts:  No  Homicidal Thoughts:  No  Memory:  Immediate;   Good Recent;   Good Remote;   Good  Judgement:  Fair  Insight:  Fair  Psychomotor Activity:  Normal  Concentration:  Good  Recall:  Good  Fund of Knowledge:Good  Language: Good  Akathisia:  No  Handed:  Right  AIMS (if indicated):     Assets:  Communication Skills  Sleep:  Number of Hours: 6.5  Cognition: WNL  ADL's:  Intact   Have you used any form of tobacco in the last 30 days? (Cigarettes, Smokeless Tobacco, Cigars, and/or Pipes): No  Has this patient used any form of tobacco in the last 30 days? (Cigarettes, Smokeless Tobacco, Cigars, and/or Pipes) N/A  Mental Status Per Nursing Assessment::   On  Admission:     Current Mental Status by Physician: patient denies suicidal ideation, intent or plan  Loss Factors: NA  Historical Factors: NA  Risk Reduction Factors:   Religious beliefs about death, Employed, Living with another person, especially a relative and Positive social support  Continued Clinical Symptoms:  Alcohol/Substance Abuse/Dependencies  Cognitive Features That Contribute To Risk:  Closed-mindedness    Suicide Risk:  Minimal: No identifiable suicidal ideation.  Patients presenting with no risk factors but with morbid ruminations; may be classified as minimal risk based on the severity of the depressive symptoms  Principal Problem: Cocaine abuse with cocaine-induced mood disorder Discharge Diagnoses:  Patient Active Problem List   Diagnosis Date Noted  . Substance induced mood disorder [F19.94] 05/07/2014  . Cocaine abuse with cocaine-induced mood disorder [F14.14]   . Alcohol abuse [F10.10]   . Suicidal ideations [R45.851]   . Acute respiratory failure [J96.00] 02/12/2014  . Fever [R50.9] 02/12/2014  . Sinus tachycardia [I47.1] 02/12/2014  . Cellulitis [L03.90] 06/28/2013  . Scrotal infection [N49.2] 06/28/2013  . Diabetes mellitus, type 2 [E11.9] 06/28/2013  . Cocaine abuse [F14.10] 06/28/2013  . Marijuana abuse [F12.10] 06/28/2013  . Leukocytosis [D72.829] 06/28/2013  . Hyponatremia [E87.1] 06/28/2013  . CAP (community acquired pneumonia) [J18.9] 12/09/2012  . Controlled diabetes mellitus type II without complication [K24.0] 97/35/3299    Follow-up Information    [facility] On 05/12/2014.   Why:  Appt with Roselyn Reef on this date at [facility]  on this date to get set up for medication management/therapy/SAIOP. Please bring photo ID with you to this appt.    Contact information:   [address] [place], [place] [address] Phone: [phone] Fax: [phone]      Plan Of Care/Follow-up recommendations:   Activity:  as tolerated Diet:  healthy  Is patient on multiple antipsychotic therapies at discharge:  No   Has Patient had three or more failed trials of antipsychotic monotherapy by history:  No  Recommended Plan for Multiple Antipsychotic Therapies: NA    Corena Pilgrim, MD 05/08/2014, 10:06 AM

## 2014-05-12 NOTE — Progress Notes (Signed)
Patient Discharge Instructions:  After Visit Summary (AVS):   Faxed to:  05/12/14 Discharge Summary Note:   Faxed to:  05/12/14 Psychiatric Admission Assessment Note:   Faxed to:  05/12/14 Suicide Risk Assessment - Discharge Assessment:   Faxed to:  05/12/14 Faxed/Sent to the Next Level Care provider:  05/12/14 Faxed to [facility] @ [place], 05/12/2014, 3:59 PM

## 2014-08-29 ENCOUNTER — Emergency Department (HOSPITAL_COMMUNITY)
Admission: EM | Admit: 2014-08-29 | Discharge: 2014-08-30 | Disposition: A | Discharge status: Other Acute Inpatient Hospital | Payer: 59 | Attending: Emergency Medicine | Admitting: Emergency Medicine

## 2014-08-29 ENCOUNTER — Encounter (HOSPITAL_COMMUNITY): Payer: Self-pay | Admitting: Emergency Medicine

## 2014-08-29 DIAGNOSIS — Z794 Long term (current) use of insulin: Secondary | ICD-10-CM | POA: Insufficient documentation

## 2014-08-29 DIAGNOSIS — R000 Tachycardia, unspecified: Secondary | ICD-10-CM | POA: Diagnosis not present

## 2014-08-29 DIAGNOSIS — Z79899 Other long term (current) drug therapy: Secondary | ICD-10-CM | POA: Diagnosis not present

## 2014-08-29 DIAGNOSIS — G4730 Sleep apnea, unspecified: Secondary | ICD-10-CM | POA: Diagnosis not present

## 2014-08-29 DIAGNOSIS — F419 Anxiety disorder, unspecified: Secondary | ICD-10-CM | POA: Diagnosis not present

## 2014-08-29 DIAGNOSIS — Z9981 Dependence on supplemental oxygen: Secondary | ICD-10-CM | POA: Insufficient documentation

## 2014-08-29 DIAGNOSIS — Z7951 Long term (current) use of inhaled steroids: Secondary | ICD-10-CM | POA: Insufficient documentation

## 2014-08-29 DIAGNOSIS — F1010 Alcohol abuse, uncomplicated: Secondary | ICD-10-CM | POA: Diagnosis not present

## 2014-08-29 DIAGNOSIS — F1210 Cannabis abuse, uncomplicated: Secondary | ICD-10-CM | POA: Diagnosis not present

## 2014-08-29 DIAGNOSIS — Z720 Tobacco use: Secondary | ICD-10-CM | POA: Insufficient documentation

## 2014-08-29 DIAGNOSIS — F1410 Cocaine abuse, uncomplicated: Secondary | ICD-10-CM | POA: Diagnosis present

## 2014-08-29 DIAGNOSIS — F329 Major depressive disorder, single episode, unspecified: Secondary | ICD-10-CM | POA: Diagnosis not present

## 2014-08-29 DIAGNOSIS — E119 Type 2 diabetes mellitus without complications: Secondary | ICD-10-CM | POA: Diagnosis not present

## 2014-08-29 DIAGNOSIS — R45851 Suicidal ideations: Secondary | ICD-10-CM

## 2014-08-29 DIAGNOSIS — G479 Sleep disorder, unspecified: Secondary | ICD-10-CM | POA: Insufficient documentation

## 2014-08-29 LAB — COMPREHENSIVE METABOLIC PANEL
ALBUMIN: 4 g/dL (ref 3.5–5.0)
ALK PHOS: 66 U/L (ref 38–126)
ALT: 23 U/L (ref 17–63)
ANION GAP: 15 (ref 5–15)
AST: 22 U/L (ref 15–41)
BUN: 11 mg/dL (ref 6–20)
CO2: 24 mmol/L (ref 22–32)
Calcium: 9.2 mg/dL (ref 8.9–10.3)
Chloride: 95 mmol/L — ABNORMAL LOW (ref 101–111)
Creatinine, Ser: 1.25 mg/dL — ABNORMAL HIGH (ref 0.61–1.24)
Glucose, Bld: 312 mg/dL — ABNORMAL HIGH (ref 65–99)
Potassium: 4.3 mmol/L (ref 3.5–5.1)
Sodium: 134 mmol/L — ABNORMAL LOW (ref 135–145)
Total Bilirubin: 0.8 mg/dL (ref 0.3–1.2)
Total Protein: 7.4 g/dL (ref 6.5–8.1)

## 2014-08-29 LAB — CBC
HEMATOCRIT: 46.8 % (ref 39.0–52.0)
HEMOGLOBIN: 16.1 g/dL (ref 13.0–17.0)
MCH: 30.3 pg (ref 26.0–34.0)
MCHC: 34.4 g/dL (ref 30.0–36.0)
MCV: 88.1 fL (ref 78.0–100.0)
Platelets: 310 10*3/uL (ref 150–400)
RBC: 5.31 MIL/uL (ref 4.22–5.81)
RDW: 12.7 % (ref 11.5–15.5)
WBC: 5.1 10*3/uL (ref 4.0–10.5)

## 2014-08-29 LAB — RAPID URINE DRUG SCREEN, HOSP PERFORMED
Amphetamines: NOT DETECTED
BARBITURATES: NOT DETECTED
BENZODIAZEPINES: NOT DETECTED
COCAINE: POSITIVE — AB
OPIATES: NOT DETECTED
Tetrahydrocannabinol: POSITIVE — AB

## 2014-08-29 LAB — ACETAMINOPHEN LEVEL: Acetaminophen (Tylenol), Serum: <10 ug/mL — ABNORMAL LOW (ref 10–30)

## 2014-08-29 LAB — I-STAT TROPONIN, ED: TROPONIN I, POC: 0 ng/mL (ref 0.00–0.08)

## 2014-08-29 LAB — CBG MONITORING, ED: Glucose-Capillary: 299 mg/dL — ABNORMAL HIGH (ref 65–99)

## 2014-08-29 LAB — SALICYLATE LEVEL

## 2014-08-29 LAB — ETHANOL: Alcohol, Ethyl (B): 90 mg/dL — ABNORMAL HIGH (ref <5)

## 2014-08-29 MED ORDER — SERTRALINE HCL 50 MG PO TABS
50.0000 mg | ORAL_TABLET | Freq: Every day | ORAL | Status: DC
  Administered 2014-08-30: 50 mg via ORAL
  Filled 2014-08-29: qty 1

## 2014-08-29 MED ORDER — LISINOPRIL 10 MG PO TABS
10.0000 mg | ORAL_TABLET | Freq: Every day | ORAL | Status: DC
  Administered 2014-08-30: 10 mg via ORAL
  Filled 2014-08-29: qty 1

## 2014-08-29 MED ORDER — LORAZEPAM 1 MG PO TABS: 1.0000 mg | ORAL_TABLET | Freq: Three times a day (TID) | ORAL | Status: DC | PRN

## 2014-08-29 MED ORDER — HYDROXYZINE HCL 25 MG PO TABS: 25.0000 mg | ORAL_TABLET | Freq: Four times a day (QID) | ORAL | Status: DC | PRN

## 2014-08-29 MED ORDER — ZOLPIDEM TARTRATE 5 MG PO TABS: 5.0000 mg | ORAL_TABLET | Freq: Every evening | ORAL | Status: DC | PRN

## 2014-08-29 MED ORDER — GLIPIZIDE 10 MG PO TABS
10.0000 mg | ORAL_TABLET | Freq: Every day | ORAL | Status: DC
  Administered 2014-08-30: 10 mg via ORAL
  Filled 2014-08-29 (×2): qty 1

## 2014-08-29 MED ORDER — SIMVASTATIN 10 MG PO TABS
10.0000 mg | ORAL_TABLET | Freq: Every day | ORAL | Status: DC
  Filled 2014-08-29: qty 1

## 2014-08-29 MED ORDER — INSULIN ASPART 100 UNIT/ML ~~LOC~~ SOLN
4.0000 [IU] | Freq: Two times a day (BID) | SUBCUTANEOUS | Status: DC
  Administered 2014-08-30: 4 [IU] via SUBCUTANEOUS
  Filled 2014-08-29: qty 1

## 2014-08-29 MED ORDER — ONDANSETRON HCL 4 MG PO TABS: 4.0000 mg | ORAL_TABLET | Freq: Three times a day (TID) | ORAL | Status: DC | PRN

## 2014-08-29 MED ORDER — IBUPROFEN 400 MG PO TABS: 600.0000 mg | ORAL_TABLET | Freq: Three times a day (TID) | ORAL | Status: DC | PRN

## 2014-08-29 MED ORDER — ALUM & MAG HYDROXIDE-SIMETH 200-200-20 MG/5ML PO SUSP
30.0000 mL | ORAL | Status: DC | PRN
Start: 2014-08-29 — End: 2014-08-30

## 2014-08-29 MED ORDER — NICOTINE 21 MG/24HR TD PT24
21.0000 mg | MEDICATED_PATCH | Freq: Every day | TRANSDERMAL | Status: DC
  Filled 2014-08-29: qty 1

## 2014-08-29 MED ORDER — METFORMIN HCL 500 MG PO TABS
500.0000 mg | ORAL_TABLET | Freq: Every day | ORAL | Status: DC
  Administered 2014-08-30: 500 mg via ORAL
  Filled 2014-08-29: qty 1

## 2014-08-29 NOTE — ED Notes (Signed)
Patient here with suicidal ideation and wanting detox from crack cocaine and alcohol.  Patient states that he last used before he got here.  Patient states that he does have a plan to cut his wrists.  Patient is diaphoretic and tachycardic in triage.  Patient is CAOx4.  Patient is calm and quiet in triage.

## 2014-08-29 NOTE — ED Notes (Signed)
CBG checked 299 RN informed

## 2014-08-29 NOTE — BH Assessment (Signed)
Received notification of TTS consult request. Spoke to Lucien Mons, PA-C who said Pt has suicidal ideation with plan to cut wrist. Pt is abusing alcohol and crack. Tele-assessment will be initiated.  Orpah Greek Anson Fret, [place], [facility], [facility] Triage Specialist [phone]

## 2014-08-29 NOTE — BH Assessment (Signed)
Contacted [facility] to arrange tele-assessment. Spoke to [place] who said she was attending to a Pt and would call TTS when available.  Orpah Greek Anson Fret, [place], [facility], [facility] Triage Specialist [phone]

## 2014-08-29 NOTE — ED Notes (Signed)
Charge, staffing and security notified of patient in triage.

## 2014-08-29 NOTE — ED Provider Notes (Signed)
CSN: [id number]     Arrival date & time 08/29/14  2020 History   First MD Initiated Contact with Patient 08/29/14 2224     Chief Complaint  Patient presents with  . Suicidal     (Consider location/radiation/quality/duration/timing/severity/associated sxs/prior Treatment) HPI Comments: 49 year old male presenting with suicidal ideation, worsening depression and requesting detox for crack cocaine and alcohol. Over the past year, patient has still with the death of his son and Joslyn Hy, losing his job in car difficulties. States it is all too much to handle and has a plan to cut his wrists. States he drinks "a lot" of alcohol and uses "a lot" of crack cocaine, last used prior to arrival. Denies homicidal ideations. Reports generalized joint pains which he believes is from "all of the drugs". Denies chest pain, shortness of breath, nausea, vomiting, fever or chills.  The history is provided by the patient.    Past Medical History  Diagnosis Date  . Diabetes mellitus   . Sleep apnea   . Depression    Past Surgical History  Procedure Laterality Date  . Tonsillectomy     Family History  Problem Relation Age of Onset  . Hypertension Mother   . Hypertension Father   . Diabetes Father    History  Substance Use Topics  . Smoking status: Current Some Day Smoker    Types: Cigars  . Smokeless tobacco: Not on file  . Alcohol Use: 1.8 oz/week    1 Glasses of wine, 1 Cans of beer, 1 Shots of liquor per week     Comment: Occasionally    Review of Systems  Musculoskeletal: Positive for arthralgias.  Psychiatric/Behavioral: Positive for suicidal ideas, behavioral problems (substance abuse), sleep disturbance, dysphoric mood and decreased concentration. The patient is nervous/anxious.   All other systems reviewed and are negative.     Allergies  Review of patient's allergies indicates no known allergies.  Home Medications   Prior to Admission medications   Medication Sig Start Date  End Date Taking? Authorizing Provider  albuterol (PROVENTIL HFA;VENTOLIN HFA) 108 (90 BASE) MCG/ACT inhaler Inhale 1-2 puffs into the lungs every 6 (six) hours as needed for wheezing or shortness of breath.   Yes Historical Provider, MD  fluticasone (FLONASE) 50 MCG/ACT nasal spray Place 1 spray into both nostrils daily.   Yes Historical Provider, MD  glipiZIDE (GLUCOTROL) 10 MG tablet Take 10 mg by mouth daily before breakfast.   Yes Historical Provider, MD  hydrOXYzine (ATARAX/VISTARIL) 25 MG tablet Take 1 tablet (25 mg total) by mouth every 6 (six) hours as needed for anxiety. 05/08/14  Yes Shuvon B Rankin, NP  insulin aspart (NOVOLOG) 100 UNIT/ML injection Inject 4-12 Units into the skin 2 (two) times daily before a meal. Sliding scale   Yes Historical Provider, MD  insulin detemir (LEVEMIR) 100 UNIT/ML injection Inject 0.2 mLs (20 Units total) into the skin daily. Patient taking differently: Inject 40 Units into the skin daily.  02/14/14  Yes Belkys A Regalado, MD  lisinopril (PRINIVIL,ZESTRIL) 10 MG tablet Take 10 mg by mouth daily.   Yes Historical Provider, MD  loratadine (CLARITIN) 10 MG tablet Take 10 mg by mouth daily.   Yes Historical Provider, MD  metFORMIN (GLUCOPHAGE) 500 MG tablet Take 500 mg by mouth daily.    Yes Historical Provider, MD  sertraline (ZOLOFT) 50 MG tablet Take 1 tablet (50 mg total) by mouth daily. For depression 05/08/14  Yes Shuvon B Rankin, NP  simvastatin (ZOCOR) 10 MG tablet Take  1 tablet (10 mg total) by mouth daily at 6 PM. For Hyperlipidemia 05/08/14  Yes Shuvon B Rankin, NP  traZODone (DESYREL) 100 MG tablet Take 1 tablet (100 mg total) by mouth at bedtime as needed for sleep. 05/08/14  Yes Shuvon B Rankin, NP   BP 133/83 mmHg  Pulse 101  Temp(Src) 98.3 F (36.8 C) (Oral)  Resp 11  Wt 221 lb 1.6 oz (100.29 kg)  SpO2 92% Physical Exam  Constitutional: He is oriented to person, place, and time. He appears well-developed and well-nourished. No distress.  HENT:   Head: Normocephalic and atraumatic.  Eyes: Conjunctivae and EOM are normal.  Neck: Normal range of motion. Neck supple.  Cardiovascular: Regular rhythm and normal heart sounds.  Tachycardia present.   Pulmonary/Chest: Effort normal and breath sounds normal.  Musculoskeletal: Normal range of motion. He exhibits no edema.  Neurological: He is alert and oriented to person, place, and time. GCS eye subscore is 4. GCS verbal subscore is 5. GCS motor subscore is 6.  Skin: Skin is warm and dry.  Psychiatric: His behavior is normal. His mood appears anxious. He exhibits a depressed mood. He expresses suicidal ideation. He expresses no homicidal ideation. He expresses suicidal plans.  Nursing note and vitals reviewed.   ED Course  Procedures (including critical care time) Labs Review Labs Reviewed  COMPREHENSIVE METABOLIC PANEL - Abnormal; Notable for the following:    Sodium 134 (*)    Chloride 95 (*)    Glucose, Bld 312 (*)    Creatinine, Ser 1.25 (*)    All other components within normal limits  ETHANOL - Abnormal; Notable for the following:    Alcohol, Ethyl (B) 90 (*)    All other components within normal limits  ACETAMINOPHEN LEVEL - Abnormal; Notable for the following:    Acetaminophen (Tylenol), Serum <10 (*)    All other components within normal limits  URINE RAPID DRUG SCREEN, HOSP PERFORMED - Abnormal; Notable for the following:    Cocaine POSITIVE (*)    Tetrahydrocannabinol POSITIVE (*)    All other components within normal limits  CBG MONITORING, ED - Abnormal; Notable for the following:    Glucose-Capillary 299 (*)    All other components within normal limits  SALICYLATE LEVEL  CBC  I-STAT TROPOININ, ED    Imaging Review No results found.   EKG Interpretation   Date/Time:  Friday August 29 2014 22:53:44 EDT Ventricular Rate:  103 PR Interval:  151 QRS Duration: 72 QT Interval:  339 QTC Calculation: 444 R Axis:   67 Text Interpretation:  Sinus tachycardia  Biatrial enlargement ST elev,  probable normal early repol pattern since last tracing no significant  change Confirmed by MILLER  MD, BRIAN ([id number]) on 08/29/2014 11:04:59 PM      MDM   Final diagnoses:  Suicidal ideation  Cocaine abuse  Alcohol abuse   Non-toxic appearing, NAD. Mildly tachycardic. Vitals otherwise stable. SI with plan. Labs at baseline. Home meds ordered including Cpap for at night. Awaiting TTS consult. Move to Pod C. Suspect psychiatric admission.  Carman Ching, PA-C 08/30/14 0050  Noemi Chapel, MD 08/30/14 [phone]

## 2014-08-30 ENCOUNTER — Inpatient Hospital Stay (HOSPITAL_COMMUNITY)
Admission: AD | Admit: 2014-08-30 | Discharge: 2014-09-03 | DRG: 897 | Disposition: A | Discharge status: Home / Self Care | Payer: 59 | Source: Intra-hospital | Attending: Psychiatry | Admitting: Psychiatry

## 2014-08-30 ENCOUNTER — Encounter (HOSPITAL_COMMUNITY): Payer: Self-pay | Admitting: Emergency Medicine

## 2014-08-30 DIAGNOSIS — F1910 Other psychoactive substance abuse, uncomplicated: Secondary | ICD-10-CM | POA: Diagnosis present

## 2014-08-30 DIAGNOSIS — Z8249 Family history of ischemic heart disease and other diseases of the circulatory system: Secondary | ICD-10-CM | POA: Diagnosis not present

## 2014-08-30 DIAGNOSIS — E119 Type 2 diabetes mellitus without complications: Secondary | ICD-10-CM | POA: Diagnosis present

## 2014-08-30 DIAGNOSIS — F1414 Cocaine abuse with cocaine-induced mood disorder: Principal | ICD-10-CM | POA: Diagnosis present

## 2014-08-30 DIAGNOSIS — G4700 Insomnia, unspecified: Secondary | ICD-10-CM | POA: Diagnosis present

## 2014-08-30 DIAGNOSIS — F410 Panic disorder [episodic paroxysmal anxiety] without agoraphobia: Secondary | ICD-10-CM | POA: Diagnosis present

## 2014-08-30 DIAGNOSIS — F1410 Cocaine abuse, uncomplicated: Secondary | ICD-10-CM | POA: Diagnosis not present

## 2014-08-30 DIAGNOSIS — F329 Major depressive disorder, single episode, unspecified: Secondary | ICD-10-CM | POA: Diagnosis present

## 2014-08-30 DIAGNOSIS — Z833 Family history of diabetes mellitus: Secondary | ICD-10-CM | POA: Diagnosis not present

## 2014-08-30 DIAGNOSIS — F1020 Alcohol dependence, uncomplicated: Secondary | ICD-10-CM | POA: Diagnosis present

## 2014-08-30 DIAGNOSIS — F1994 Other psychoactive substance use, unspecified with psychoactive substance-induced mood disorder: Secondary | ICD-10-CM | POA: Diagnosis not present

## 2014-08-30 DIAGNOSIS — R45851 Suicidal ideations: Secondary | ICD-10-CM | POA: Diagnosis present

## 2014-08-30 DIAGNOSIS — F1010 Alcohol abuse, uncomplicated: Secondary | ICD-10-CM | POA: Diagnosis present

## 2014-08-30 DIAGNOSIS — F1920 Other psychoactive substance dependence, uncomplicated: Secondary | ICD-10-CM | POA: Diagnosis not present

## 2014-08-30 LAB — GLUCOSE, CAPILLARY
GLUCOSE-CAPILLARY: 291 mg/dL — AB (ref 65–99)
Glucose-Capillary: 348 mg/dL — ABNORMAL HIGH (ref 65–99)

## 2014-08-30 MED ORDER — SIMVASTATIN 10 MG PO TABS
10.0000 mg | ORAL_TABLET | Freq: Every day | ORAL | Status: DC
  Administered 2014-08-31 – 2014-09-02 (×3): 10 mg via ORAL
  Filled 2014-08-30 (×7): qty 1

## 2014-08-30 MED ORDER — VITAMIN B-1 100 MG PO TABS
100.0000 mg | ORAL_TABLET | Freq: Every day | ORAL | Status: DC
  Administered 2014-08-31 – 2014-09-03 (×4): 100 mg via ORAL
  Filled 2014-08-30 (×8): qty 1

## 2014-08-30 MED ORDER — CHLORDIAZEPOXIDE HCL 25 MG PO CAPS
25.0000 mg | ORAL_CAPSULE | Freq: Four times a day (QID) | ORAL | Status: AC | PRN
  Filled 2014-08-30: qty 1

## 2014-08-30 MED ORDER — MAGNESIUM HYDROXIDE 400 MG/5ML PO SUSP: 30.0000 mL | Freq: Every day | ORAL | Status: DC | PRN

## 2014-08-30 MED ORDER — LISINOPRIL 10 MG PO TABS
10.0000 mg | ORAL_TABLET | Freq: Every day | ORAL | Status: DC
  Administered 2014-08-30 – 2014-09-03 (×5): 10 mg via ORAL
  Filled 2014-08-30 (×5): qty 1
  Filled 2014-08-30 (×2): qty 2
  Filled 2014-08-30: qty 1
  Filled 2014-08-30 (×2): qty 2
  Filled 2014-08-30: qty 1

## 2014-08-30 MED ORDER — SERTRALINE HCL 50 MG PO TABS
50.0000 mg | ORAL_TABLET | Freq: Every day | ORAL | Status: DC
  Administered 2014-08-30 – 2014-09-03 (×5): 50 mg via ORAL
  Filled 2014-08-30 (×8): qty 1
  Filled 2014-08-30: qty 3
  Filled 2014-08-30: qty 1

## 2014-08-30 MED ORDER — ACETAMINOPHEN 325 MG PO TABS
650.0000 mg | ORAL_TABLET | Freq: Four times a day (QID) | ORAL | Status: DC | PRN
  Administered 2014-08-31 – 2014-09-02 (×2): 650 mg via ORAL
  Filled 2014-08-30 (×2): qty 2

## 2014-08-30 MED ORDER — SIMVASTATIN 5 MG PO TABS
10.0000 mg | ORAL_TABLET | Freq: Once | ORAL | Status: AC
  Administered 2014-08-30: 10 mg via ORAL
  Filled 2014-08-30 (×2): qty 2

## 2014-08-30 MED ORDER — GLIPIZIDE 10 MG PO TABS
10.0000 mg | ORAL_TABLET | Freq: Every day | ORAL | Status: DC
  Administered 2014-08-31 – 2014-09-03 (×4): 10 mg via ORAL
  Filled 2014-08-30 (×4): qty 1
  Filled 2014-08-30: qty 2
  Filled 2014-08-30 (×2): qty 1
  Filled 2014-08-30: qty 2

## 2014-08-30 MED ORDER — INSULIN ASPART 100 UNIT/ML ~~LOC~~ SOLN
0.0000 [IU] | Freq: Every day | SUBCUTANEOUS | Status: DC
  Administered 2014-08-30: 4 [IU] via SUBCUTANEOUS
  Administered 2014-08-31 – 2014-09-01 (×2): 5 [IU] via SUBCUTANEOUS

## 2014-08-30 MED ORDER — LOPERAMIDE HCL 2 MG PO CAPS: 2.0000 mg | ORAL_CAPSULE | ORAL | Status: AC | PRN

## 2014-08-30 MED ORDER — CHLORDIAZEPOXIDE HCL 25 MG PO CAPS
25.0000 mg | ORAL_CAPSULE | ORAL | Status: AC
  Administered 2014-09-02 (×2): 25 mg via ORAL
  Filled 2014-08-30 (×2): qty 1

## 2014-08-30 MED ORDER — INSULIN ASPART 100 UNIT/ML ~~LOC~~ SOLN
0.0000 [IU] | Freq: Three times a day (TID) | SUBCUTANEOUS | Status: DC
  Administered 2014-08-30: 8 [IU] via SUBCUTANEOUS
  Administered 2014-08-31: 11 [IU] via SUBCUTANEOUS
  Administered 2014-08-31: 15 [IU] via SUBCUTANEOUS
  Administered 2014-09-01 (×2): 11 [IU] via SUBCUTANEOUS
  Administered 2014-09-02: 8 [IU] via SUBCUTANEOUS
  Administered 2014-09-02 (×2): 15 [IU] via SUBCUTANEOUS

## 2014-08-30 MED ORDER — CHLORDIAZEPOXIDE HCL 25 MG PO CAPS
25.0000 mg | ORAL_CAPSULE | Freq: Four times a day (QID) | ORAL | Status: AC
  Administered 2014-08-30 – 2014-08-31 (×6): 25 mg via ORAL
  Filled 2014-08-30 (×6): qty 1

## 2014-08-30 MED ORDER — HYDROXYZINE HCL 25 MG PO TABS
25.0000 mg | ORAL_TABLET | Freq: Four times a day (QID) | ORAL | Status: AC | PRN
  Administered 2014-08-30 – 2014-08-31 (×2): 25 mg via ORAL
  Filled 2014-08-30 (×2): qty 1

## 2014-08-30 MED ORDER — CHLORDIAZEPOXIDE HCL 25 MG PO CAPS
25.0000 mg | ORAL_CAPSULE | Freq: Three times a day (TID) | ORAL | Status: AC
  Administered 2014-09-01 (×3): 25 mg via ORAL
  Filled 2014-08-30 (×2): qty 1

## 2014-08-30 MED ORDER — ONDANSETRON 4 MG PO TBDP: 4.0000 mg | ORAL_TABLET | Freq: Four times a day (QID) | ORAL | Status: AC | PRN

## 2014-08-30 MED ORDER — CHLORDIAZEPOXIDE HCL 25 MG PO CAPS
25.0000 mg | ORAL_CAPSULE | Freq: Every day | ORAL | Status: AC
  Administered 2014-09-03: 25 mg via ORAL
  Filled 2014-08-30: qty 1

## 2014-08-30 MED ORDER — THIAMINE HCL 100 MG/ML IJ SOLN
100.0000 mg | Freq: Once | INTRAMUSCULAR | Status: AC
  Administered 2014-08-30: 100 mg via INTRAMUSCULAR
  Filled 2014-08-30: qty 2

## 2014-08-30 MED ORDER — ALUM & MAG HYDROXIDE-SIMETH 200-200-20 MG/5ML PO SUSP: 30.0000 mL | ORAL | Status: DC | PRN

## 2014-08-30 MED ORDER — METFORMIN HCL 500 MG PO TABS
500.0000 mg | ORAL_TABLET | Freq: Every day | ORAL | Status: DC
  Administered 2014-08-31 – 2014-09-03 (×4): 500 mg via ORAL
  Filled 2014-08-30 (×8): qty 1

## 2014-08-30 MED ORDER — ADULT MULTIVITAMIN W/MINERALS CH
1.0000 | ORAL_TABLET | Freq: Every day | ORAL | Status: DC
  Administered 2014-08-30 – 2014-09-03 (×5): 1 via ORAL
  Filled 2014-08-30 (×10): qty 1

## 2014-08-30 MED ORDER — INSULIN ASPART 100 UNIT/ML ~~LOC~~ SOLN: 0.0000 [IU] | Freq: Three times a day (TID) | SUBCUTANEOUS | Status: DC

## 2014-08-30 MED ORDER — TRAZODONE HCL 50 MG PO TABS
50.0000 mg | ORAL_TABLET | Freq: Every evening | ORAL | Status: DC | PRN
  Administered 2014-08-30 – 2014-09-02 (×4): 50 mg via ORAL
  Filled 2014-08-30 (×3): qty 1
  Filled 2014-08-30: qty 3
  Filled 2014-08-30: qty 1

## 2014-08-30 NOTE — ED Notes (Signed)
Pt given cup of coffee.

## 2014-08-30 NOTE — ED Notes (Signed)
Pt cbg 280, pt breakfast arrived.

## 2014-08-30 NOTE — ED Notes (Signed)
STATES ONLY TAKES NOVOLOG TWICE DAILY W/MEALS - SLIDING SCALE AND LEVEMIR ONCE DAILY. PER PHARMACIST RECOMMENDATIONS FOR EDP, D/C PT'S NOVOLOG AND ADD HOSPITAL SLIDING SCALE TID AND ADD PT'S LEVEMIR IF WANTS TO DO SO.

## 2014-08-30 NOTE — ED Notes (Signed)
Pt took a shower 
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
## 2014-08-30 NOTE — ED Provider Notes (Signed)
  Physical Exam  BP 142/90 mmHg  Pulse 90  Temp(Src) 97.9 F (36.6 C) (Oral)  Resp 18  Wt 221 lb 1.6 oz (100.29 kg)  SpO2 99%  Physical Exam  Constitutional: No distress.  HENT:  Head: Normocephalic and atraumatic.  Eyes: Conjunctivae and EOM are normal.  Pulmonary/Chest: No respiratory distress.  Skin: He is not diaphoretic.    ED Course  Procedures  MDM Received care of the patient at 9 AM. Please see previous notes for history and prior care. Briefly this is a 49 year old male with a history of diabetes and depression who presents with concern of suicidal ideation and request for detox from crack cocaine and alcohol.  Patient currently qualifies for inpatient voluntary treatment. Pt glucose in 200s, no sign DKA.  Dr. Sabra Heck accepting patient in transfer for psychiatric inpatient admission. Pt stable and evaluated prior to time of transfer and transferred in stable condition.      Gareth Morgan, MD 08/30/14 2056

## 2014-08-30 NOTE — ED Notes (Signed)
Pt signed consent forms - faxed to [facility].
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
## 2014-08-30 NOTE — ED Notes (Signed)
Pt cbg 283

## 2014-08-30 NOTE — Progress Notes (Signed)
D.  Pt pleasant on approach, no complaints voiced.  Positive for evening AA group, interacting appropriately with peers on the unit.  Denies SI/HI/hallucinatons at this time . A.  Support and encouragement offered  R.  Pt remains safe on the unit, will continue to monitor.

## 2014-08-30 NOTE — BH Assessment (Signed)
Faxed clinical information to the following facilities for review:  [facility]   [place], [place], [facility], [facility] Triage Specialist [phone]

## 2014-08-30 NOTE — ED Notes (Signed)
Notified pt's spouse per pt's request of being accepted to [facility] and will need CPAP machine.

## 2014-08-30 NOTE — ED Provider Notes (Signed)
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 And has tried to use cocaine and alcohol to cover up his depression. He reports it is getting worse, he is started to have suicidal thoughts including cutting his wrists or driving his car off the road. He denies ever trying to do that in the past. He feels physically exhausted, reports not eating in the last 2 days. On exam he has a soft abdomen, clear heart and lung sounds without tachycardia, no peripheral edema, appears sad tearful and depressed. He will need further psychiatric evaluation and likely inpatient admission.  Medical screening examination/treatment/procedure(s) were conducted as a shared visit with non-physician practitioner(s) and myself.  I personally evaluated the patient during the encounter.  Clinical Impression:   Final diagnoses:  Suicidal ideation  Cocaine abuse  Alcohol abuse         Noemi Chapel, MD 08/30/14 1041

## 2014-08-30 NOTE — ED Notes (Signed)
Pt lunch tray delivered.

## 2014-08-30 NOTE — ED Notes (Signed)
Pt cbg 228

## 2014-08-30 NOTE — ED Notes (Signed)
Pt offered shower, pt will shower when he is done with his lunch tray.

## 2014-08-30 NOTE — BH Assessment (Signed)
Jeanne Ivan, RN to arrange tele-assessment. She said to check back in 20 minutes.  Orpah Greek Anson Fret, [place], [facility], [facility] Triage Specialist [phone]

## 2014-08-30 NOTE — ED Notes (Signed)
Pt lunch tray ordered.

## 2014-08-31 ENCOUNTER — Encounter (HOSPITAL_COMMUNITY): Payer: Self-pay | Admitting: Registered Nurse

## 2014-08-31 DIAGNOSIS — F1994 Other psychoactive substance use, unspecified with psychoactive substance-induced mood disorder: Secondary | ICD-10-CM

## 2014-08-31 DIAGNOSIS — F329 Major depressive disorder, single episode, unspecified: Secondary | ICD-10-CM

## 2014-08-31 LAB — GLUCOSE, CAPILLARY
GLUCOSE-CAPILLARY: 425 mg/dL — AB (ref 65–99)
Glucose-Capillary: 315 mg/dL — ABNORMAL HIGH (ref 65–99)
Glucose-Capillary: 351 mg/dL — ABNORMAL HIGH (ref 65–99)
Glucose-Capillary: 362 mg/dL — ABNORMAL HIGH (ref 65–99)

## 2014-08-31 LAB — CBG MONITORING, ED
GLUCOSE-CAPILLARY: 283 mg/dL — AB (ref 65–99)
Glucose-Capillary: 280 mg/dL — ABNORMAL HIGH (ref 65–99)
Glucose-Capillary: 228 mg/dL — ABNORMAL HIGH (ref 65–99)

## 2014-08-31 MED ORDER — INSULIN ASPART 100 UNIT/ML ~~LOC~~ SOLN
17.0000 [IU] | Freq: Once | SUBCUTANEOUS | Status: AC
  Administered 2014-08-31: 17 [IU] via SUBCUTANEOUS

## 2014-08-31 NOTE — BHH Group Notes (Signed)
[place] Group Notes:  (Nursing/MHT/Case Management/Adjunct)  Date:  08/31/2014  Time:  2:15 PM  Type of Therapy:  Nurse Education  Participation Level:  Did Not Attend   Summary of Progress/Problems:  Jonathan Austin 08/31/2014, 5:09 PM

## 2014-08-31 NOTE — Progress Notes (Addendum)
CBG 425 mg/dl NP notified. New orders : Novolog 17 units SQ x 1 dose ordered. Will continue to monitor.

## 2014-08-31 NOTE — BHH Group Notes (Signed)
[place] Group Notes:  (Clinical Social Work)  08/31/2014  10:00-11:00AM  Summary of Progress/Problems:   The main focus of today's process group was to   1)  discuss the importance of adding supports  2)  define health supports versus unhealthy supports  3)  identify the patient's current unhealthy supports and plan how to handle them  4)  Identify the patient's current healthy supports and plan what to add.  An emphasis was placed on using counselor, doctor, therapy groups, 12-step groups, and problem-specific support groups to expand supports.    The patient said his wife is a healthy support.  He was drowsy, kept falling asleep during group and was apologetic about it.  Type of Therapy:  Process Group with Motivational Interviewing  Participation Level:  Minimal  Participation Quality:  Drowsy  Affect:  Blunted  Cognitive:  Appropriate  Insight:  Limited  Engagement in Therapy:  Limited  Modes of Intervention:   Education, Support and Processing, Activity  Selmer Dominion, LCSW 08/31/2014

## 2014-08-31 NOTE — Progress Notes (Signed)
D: Per patient self inventory form patient reports he slept good last night with the use of sleep medication. He reports a good appetite, low energy level, good concentration. He rates depression 7/10, hopelessness 5/10, anxiety 7/10, all on 1-10 scale, 10 being the worse. He c/o withdrawal symptoms including palm sweating. He denies SI/HI. He denies AVH. "I want to meet with my wife when she comes to visit, I want to get this right." He reports his goal is "getting my recovery started to be sober." He reports to meet his goal  He will "go to meeting and work hard toward my goal."  A: Special checks q 15 mins in place for safety. Medication administered per MD order (see eMAR). Encouragement and support provided.   R: Safety maintained. Compliant with medication regimen. Will continue to monitor.

## 2014-08-31 NOTE — Progress Notes (Signed)
D.  Pt pleasant on approach, denies complaints at this time.  Wife brought C-Pap from home and Pt pleased by this.  Positive for evening AA group, interacting appropriately with peers on the unit.  Denies SI/HI/hallucinations at this time.A. Support and encouragement offered, called AC to get distilled water for C-Pap.  R.  Pt remains safe on unit, will continue to monitor.

## 2014-08-31 NOTE — [facility] Suicide Risk Assessment (Signed)
[facility] Admission Suicide Risk Assessment   Nursing information obtained from:  Patient Demographic factors:  Male, Low socioeconomic status Current Mental Status:  NA Loss Factors:  Loss of significant relationship, Legal issues, Financial problems / change in socioeconomic status Historical Factors:  Prior suicide attempts, Family history of suicide, Family history of mental illness or substance abuse Risk Reduction Factors:  Sense of responsibility to family, Religious beliefs about death, Living with another person, especially a relative, Positive social support Total Time spent with patient: 30 minutes Principal Problem: Substance induced mood disorder Diagnosis:   Patient Active Problem List   Diagnosis Date Noted  . Substance induced mood disorder [F19.94] 05/07/2014    Priority: High  . Substance abuse [F19.10] 08/30/2014  . Cocaine abuse with cocaine-induced mood disorder [F14.14]   . Alcohol abuse [F10.10]   . Suicidal ideations [R45.851]   . Acute respiratory failure [J96.00] 02/12/2014  . Fever [R50.9] 02/12/2014  . Sinus tachycardia [I47.1] 02/12/2014  . Cellulitis [L03.90] 06/28/2013  . Scrotal infection [N49.2] 06/28/2013  . Diabetes mellitus, type 2 [E11.9] 06/28/2013  . Cocaine abuse [F14.10] 06/28/2013  . Marijuana abuse [F12.10] 06/28/2013  . Leukocytosis [D72.829] 06/28/2013  . Hyponatremia [E87.1] 06/28/2013  . CAP (community acquired pneumonia) [J18.9] 12/09/2012  . Controlled diabetes mellitus type II without complication [V61.6] 07/37/1062     Continued Clinical Symptoms:  Alcohol Use Disorder Identification Test Final Score (AUDIT): 30 The "Alcohol Use Disorders Identification Test", Guidelines for Use in Primary Care, Second Edition.  World Pharmacologist [facility] Score between 0-7:  no or low risk or alcohol related problems. Score between 8-15:  moderate risk of alcohol related problems. Score between 16-19:  high risk of alcohol related  problems. Score 20 or above:  warrants further diagnostic evaluation for alcohol dependence and treatment.   CLINICAL FACTORS:   Severe Anxiety and/or Agitation Depression:   Comorbid alcohol abuse/dependence Hopelessness Impulsivity Insomnia Severe Alcohol/Substance Abuse/Dependencies Previous Psychiatric Diagnoses and Treatments   Musculoskeletal: Strength & Muscle Tone: within normal limits Gait & Station: normal Patient leans: N/A  Psychiatric Specialty Exam: Physical Exam  Psychiatric: Thought content normal. His mood appears anxious. His speech is slurred. He is slowed and withdrawn. Cognition and memory are normal. He expresses impulsivity. He exhibits a depressed mood.    Review of Systems  Constitutional: Positive for malaise/fatigue.  Eyes: Negative.   Respiratory: Negative.   Cardiovascular: Negative.   Gastrointestinal: Positive for nausea.  Genitourinary: Negative.   Musculoskeletal: Positive for myalgias.  Skin: Negative.   Neurological: Positive for weakness and headaches.  Endo/Heme/Allergies: Negative.   Psychiatric/Behavioral: Positive for depression and substance abuse. The patient is nervous/anxious and has insomnia.     Blood pressure 122/94, pulse 104, temperature 97.8 F (36.6 C), temperature source Oral, resp. rate 16, height 5\' 7"  (1.702 m), weight 100.245 kg (221 lb).Body mass index is 34.61 kg/(m^2).  General Appearance: Casual  Eye Contact::  Minimal  Speech:  Pressured  Volume:  Normal  Mood:  Anxious, Depressed and Dysphoric  Affect:  Constricted  Thought Process:  Goal Directed  Orientation:  Full (Time, Place, and Person)  Thought Content:  Negative  Suicidal Thoughts:  No  Homicidal Thoughts:  No  Memory:  Immediate;   Fair Recent;   Fair Remote;   Fair  Judgement:  Impaired  Insight:  Shallow  Psychomotor Activity:  Decreased  Concentration:  Good  Recall:  [place] of Knowledge:Good  Language: Good  Akathisia:  No   Handed:  Right  AIMS (if indicated):     Assets:  Communication Skills Desire for Improvement Social Support  Sleep:   poor  Cognition: WNL  ADL's:  Intact     COGNITIVE FEATURES THAT CONTRIBUTE TO RISK:  Closed-mindedness    SUICIDE RISK:   Minimal: No identifiable suicidal ideation.  Patients presenting with no risk factors but with morbid ruminations; may be classified as minimal risk based on the severity of the depressive symptoms  PLAN OF CARE: 1. Admit for crisis management and stabilization. 2. Medication management to reduce current symptoms to base line and improve the patient's overall level of functioning 3. Treat health problems as indicated. 4. Develop treatment plan to decrease risk of relapse upon discharge and the need for  readmission. 5. Psycho-social education regarding relapse prevention and self care. 6. Health care follow up as needed for medical problems. 7. Restart home medications where appropriate.   Medical Decision Making:  Review or order clinical lab tests (1), Established Problem, Worsening (2), Review of Medication Regimen & Side Effects (2) and Review of New Medication or Change in Dosage (2)  I certify that inpatient services furnished can reasonably be expected to improve the patient's condition.   Corena Pilgrim, MD 08/31/2014, 11:05 AM
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
## 2014-09-01 DIAGNOSIS — F1920 Other psychoactive substance dependence, uncomplicated: Secondary | ICD-10-CM

## 2014-09-01 LAB — LIPID PANEL
CHOL/HDL RATIO: 5.4 ratio
CHOLESTEROL: 265 mg/dL — AB (ref 0–200)
HDL: 49 mg/dL (ref >40)
LDL CALC: 146 mg/dL — AB (ref 0–99)
TRIGLYCERIDES: 351 mg/dL — AB (ref <150)
VLDL: 70 mg/dL — AB (ref 0–40)

## 2014-09-01 LAB — GLUCOSE, CAPILLARY
GLUCOSE-CAPILLARY: 334 mg/dL — AB (ref 65–99)
GLUCOSE-CAPILLARY: 340 mg/dL — AB (ref 65–99)
Glucose-Capillary: 535 mg/dL — ABNORMAL HIGH (ref 65–99)
Glucose-Capillary: 380 mg/dL — ABNORMAL HIGH (ref 65–99)

## 2014-09-01 MED ORDER — INSULIN ASPART 100 UNIT/ML ~~LOC~~ SOLN
20.0000 [IU] | Freq: Once | SUBCUTANEOUS | Status: AC
  Administered 2014-09-01: 20 [IU] via SUBCUTANEOUS

## 2014-09-01 NOTE — Progress Notes (Signed)
D: Patient in bed on approach.  Patient states he had a good day.  Patient states he feels drowsy today.  Patient states he has been eating whatever he wants in the cafeteria.  Patient states he knows his blood sugars are high and states he will do better tomorrow.  Patient denies SI/HI and AVH.   A: Staff to monitor Q 15 mins for safety.  Encouragement and support offered.  Scheduled medications administered per orders.  Trazodone administered prn for sleep.   R: Patient remains safe on the unit.  Patient attended group tonight.  Patient visible on the unit for group.  Patient not interacting with peers tonight.  Patient taking administered medications.

## 2014-09-01 NOTE — BHH Group Notes (Signed)
[facility] LCSW Aftercare Discharge Planning Group Note  09/01/2014  8:45 AM  Participation Quality: Did Not Attend. Patient invited to participate but declined.  Tilden Fossa, MSW, [place] Worker [facility] [phone]

## 2014-09-01 NOTE — Plan of Care (Signed)
Problem: Alteration in mood & ability to function due to Goal: STG-Patient will attend groups Outcome: Progressing Pt has attended AA groups with appropriate participation all weekend

## 2014-09-01 NOTE — Plan of Care (Signed)
Problem: Alteration in mood Goal: LTG-Patient reports reduction in suicidal thoughts (Patient reports reduction in suicidal thoughts and is able to verbalize a safety plan for whenever patient is feeling suicidal)  Outcome: Progressing Pt has denied suicidal ideation and has stated that he is forming a plan to get his life back on track with his wife who is a Theme park manager

## 2014-09-01 NOTE — Progress Notes (Signed)
Patient did attend the evening speaker AA meeting.  

## 2014-09-01 NOTE — BHH Group Notes (Signed)
[facility] LCSW Group Therapy 09/01/2014  1:15 pm  Type of Therapy: Group Therapy Participation Level: Active  Participation Quality: Attentive, Sharing and Supportive  Affect: Depressed and Flat; Lethargic  Cognitive: Alert and Oriented  Insight: Developing/Improving and Engaged  Engagement in Therapy: Developing/Improving and Engaged  Modes of Intervention: Clarification, Confrontation, Discussion, Education, Exploration,  Limit-setting, Orientation, Problem-solving, Rapport Building, Art therapist, Socialization and Support  Summary of Progress/Problems: Pt identified obstacles faced currently and processed barriers involved in overcoming these obstacles. Pt identified steps necessary for overcoming these obstacles and explored motivation (internal and external) for facing these difficulties head on. Pt further identified one area of concern in their lives and chose a goal to focus on for today. 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 Patient shared that his faith is important to him in his recovery but that he also holds his self accountable. He reports that he plans to "fully commit" to AA and start attending regularly. He also identified his wife a strong support. CSW and other group members provided patient with emotional support and encouragement.  Tilden Fossa, MSW, [place] Worker [facility] [phone]

## 2014-09-01 NOTE — Progress Notes (Signed)
[facility] MD Progress Note  09/01/2014 5:31 PM Jonathan Austin  MRN:  [id number] Subjective:  Jonathan Austin states he really needs to get his life back together. He in the past was able to maintain abstinence by working the program but then he got too confident and started missing meetings here and there until he quit going and shortly after that he had relapsed. States he is very upset with himself. States that his wife is supportive but she has been under a lot of stress too. He lost his biological son to a heart attack in his last 20's and she lost her son when he was shot and killed. He was in his early 17's. States his using is causing undue stress and pain to her Principal Problem: Substance induced mood disorder Diagnosis:   Patient Active Problem List   Diagnosis Date Noted  . Substance abuse [F19.10] 08/30/2014  . Substance induced mood disorder [F19.94] 05/07/2014  . Cocaine abuse with cocaine-induced mood disorder [F14.14]   . Alcohol abuse [F10.10]   . Suicidal ideations [R45.851]   . Acute respiratory failure [J96.00] 02/12/2014  . Fever [R50.9] 02/12/2014  . Sinus tachycardia [I47.1] 02/12/2014  . Cellulitis [L03.90] 06/28/2013  . Scrotal infection [N49.2] 06/28/2013  . Diabetes mellitus, type 2 [E11.9] 06/28/2013  . Cocaine abuse [F14.10] 06/28/2013  . Marijuana abuse [F12.10] 06/28/2013  . Leukocytosis [D72.829] 06/28/2013  . Hyponatremia [E87.1] 06/28/2013  . CAP (community acquired pneumonia) [J18.9] 12/09/2012  . Controlled diabetes mellitus type II without complication [S97.0] 26/37/8588   Total Time spent with patient: 30 minutes   Past Medical History:  Past Medical History  Diagnosis Date  . Diabetes mellitus   . Sleep apnea   . Depression     Past Surgical History  Procedure Laterality Date  . Tonsillectomy     Family History:  Family History  Problem Relation Age of Onset  . Hypertension Mother   . Hypertension Father   . Diabetes Father    Social History:   History  Alcohol Use  . 1.8 oz/week  . 1 Glasses of wine, 1 Cans of beer, 1 Shots of liquor per week    Comment: Occasionally     History  Drug Use  . Yes  . Special: Cocaine, Marijuana    History   Social History  . Marital Status: Married    Spouse Name: N/A  . Number of Children: N/A  . Years of Education: N/A   Social History Main Topics  . Smoking status: Never Smoker   . Smokeless tobacco: Never Used  . Alcohol Use: 1.8 oz/week    1 Glasses of wine, 1 Cans of beer, 1 Shots of liquor per week     Comment: Occasionally  . Drug Use: Yes    Special: Cocaine, Marijuana  . Sexual Activity: Yes   Other Topics Concern  . None   Social History Narrative   Additional History:    Sleep: Fair  Appetite:  Fair   Assessment:   Musculoskeletal: Strength & Muscle Tone: within normal limits Gait & Station: normal Patient leans: normal   Psychiatric Specialty Exam: Physical Exam  Review of Systems  Constitutional: Positive for malaise/fatigue.  HENT: Negative.   Eyes: Negative.   Respiratory: Negative.   Cardiovascular: Negative.   Gastrointestinal: Negative.   Genitourinary: Negative.   Musculoskeletal: Negative.   Skin: Negative.   Neurological: Positive for weakness.  Endo/Heme/Allergies: Negative.   Psychiatric/Behavioral: Positive for depression and substance abuse. The patient is nervous/anxious.  Blood pressure 123/74, pulse 109, temperature 97.7 F (36.5 C), temperature source Oral, resp. rate 16, height 5\' 7"  (1.702 m), weight 100.245 kg (221 lb).Body mass index is 34.61 kg/(m^2).  General Appearance: Fairly Groomed  Engineer, water::  Fair  Speech:  Clear and Coherent  Volume:  Decreased  Mood:  Anxious and Depressed  Affect:  Restricted  Thought Process:  Coherent and Goal Directed  Orientation:  Full (Time, Place, and Person)  Thought Content:  symptoms events worries concerns  Suicidal Thoughts:  No  Homicidal Thoughts:  No  Memory:   Immediate;   Fair Recent;   Fair Remote;   Fair  Judgement:  Fair  Insight:  Present and Shallow  Psychomotor Activity:  Restlessness  Concentration:  Fair  Recall:  [employer] of Knowledge:Fair  Language: Fair  Akathisia:  No  Handed:  Right  AIMS (if indicated):     Assets:  Desire for Improvement Housing Social Support  ADL's:  Intact  Cognition: WNL  Sleep:        Current Medications: Current Facility-Administered Medications  Medication Dose Route Frequency Provider Last Rate Last Dose  . acetaminophen (TYLENOL) tablet 650 mg  650 mg Oral Q6H PRN Kerrie Buffalo, NP   650 mg at 08/31/14 0752  . alum & mag hydroxide-simeth (MAALOX/MYLANTA) 200-200-20 MG/5ML suspension 30 mL  30 mL Oral Q4H PRN Kerrie Buffalo, NP      . chlordiazePOXIDE (LIBRIUM) capsule 25 mg  25 mg Oral Q6H PRN Kerrie Buffalo, NP      . Derrill Memo ON 09/02/2014] chlordiazePOXIDE (LIBRIUM) capsule 25 mg  25 mg Oral BH-qamhs Kerrie Buffalo, NP       Followed by  . [START ON 09/03/2014] chlordiazePOXIDE (LIBRIUM) capsule 25 mg  25 mg Oral Daily Kerrie Buffalo, NP      . glipiZIDE (GLUCOTROL) tablet 10 mg  10 mg Oral QAC breakfast Kerrie Buffalo, NP   10 mg at 09/01/14 6659  . hydrOXYzine (ATARAX/VISTARIL) tablet 25 mg  25 mg Oral Q6H PRN Kerrie Buffalo, NP   25 mg at 08/31/14 2120  . insulin aspart (novoLOG) injection 0-15 Units  0-15 Units Subcutaneous TID WC Kerrie Buffalo, NP   11 Units at 09/01/14 1213  . insulin aspart (novoLOG) injection 0-5 Units  0-5 Units Subcutaneous QHS Kerrie Buffalo, NP   5 Units at 08/31/14 2117  . lisinopril (PRINIVIL,ZESTRIL) tablet 10 mg  10 mg Oral Daily Kerrie Buffalo, NP   10 mg at 09/01/14 0851  . loperamide (IMODIUM) capsule 2-4 mg  2-4 mg Oral PRN Kerrie Buffalo, NP      . magnesium hydroxide (MILK OF MAGNESIA) suspension 30 mL  30 mL Oral Daily PRN Kerrie Buffalo, NP      . metFORMIN (GLUCOPHAGE) tablet 500 mg  500 mg Oral Q breakfast Kerrie Buffalo, NP   500 mg at 09/01/14  0851  . multivitamin with minerals tablet 1 tablet  1 tablet Oral Daily Kerrie Buffalo, NP   1 tablet at 09/01/14 [phone]  . ondansetron (ZOFRAN-ODT) disintegrating tablet 4 mg  4 mg Oral Q6H PRN Kerrie Buffalo, NP      . sertraline (ZOLOFT) tablet 50 mg  50 mg Oral Daily Kerrie Buffalo, NP   50 mg at 09/01/14 0851  . simvastatin (ZOCOR) tablet 10 mg  10 mg Oral q1800 Nicholaus Bloom, MD   10 mg at 08/31/14 1809  . thiamine (VITAMIN B-1) tablet 100 mg  100 mg Oral Daily Kerrie Buffalo, NP  100 mg at 09/01/14 0851  . traZODone (DESYREL) tablet 50 mg  50 mg Oral QHS PRN Kerrie Buffalo, NP   50 mg at 08/31/14 2120    Lab Results:  Results for orders placed or performed during the hospital encounter of 08/30/14 (from the past 48 hour(s))  Glucose, capillary     Status: Abnormal   Collection Time: 08/30/14  9:25 PM  Result Value Ref Range   Glucose-Capillary 348 (H) 65 - 99 mg/dL   Comment 1 Notify RN   Glucose, capillary     Status: Abnormal   Collection Time: 08/31/14  6:02 AM  Result Value Ref Range   Glucose-Capillary 315 (H) 65 - 99 mg/dL   Comment 1 Notify RN   Glucose, capillary     Status: Abnormal   Collection Time: 08/31/14 12:17 PM  Result Value Ref Range   Glucose-Capillary 351 (H) 65 - 99 mg/dL  Glucose, capillary     Status: Abnormal   Collection Time: 08/31/14  5:22 PM  Result Value Ref Range   Glucose-Capillary 425 (H) 65 - 99 mg/dL  Lipid panel     Status: Abnormal   Collection Time: 08/31/14  7:31 PM  Result Value Ref Range   Cholesterol 265 (H) 0 - 200 mg/dL   Triglycerides 351 (H) <150 mg/dL   HDL 49 >40 mg/dL   Total CHOL/HDL Ratio 5.4 RATIO   VLDL 70 (H) 0 - 40 mg/dL   LDL Cholesterol 146 (H) 0 - 99 mg/dL    Comment:        Total Cholesterol/HDL:CHD Risk Coronary Heart Disease Risk Table                     Men   Women  1/2 Average Risk   3.4   3.3  Average Risk       5.0   4.4  2 X Average Risk   9.6   7.1  3 X Average Risk  23.4   11.0        Use the  calculated Patient Ratio above and the CHD Risk Table to determine the patient's CHD Risk.        ATP III CLASSIFICATION (LDL):  <100     mg/dL   Optimal  100-129  mg/dL   Near or Above                    Optimal  130-159  mg/dL   Borderline  160-189  mg/dL   High  >190     mg/dL   Very High Performed at [facility]   Glucose, capillary     Status: Abnormal   Collection Time: 08/31/14  9:11 PM  Result Value Ref Range   Glucose-Capillary 362 (H) 65 - 99 mg/dL   Comment 1 Notify RN    Comment 2 Document in Chart   Glucose, capillary     Status: Abnormal   Collection Time: 09/01/14  6:24 AM  Result Value Ref Range   Glucose-Capillary 334 (H) 65 - 99 mg/dL   Comment 1 Notify RN    Comment 2 Document in Chart   Glucose, capillary     Status: Abnormal   Collection Time: 09/01/14 12:10 PM  Result Value Ref Range   Glucose-Capillary 340 (H) 65 - 99 mg/dL  Glucose, capillary     Status: Abnormal   Collection Time: 09/01/14  5:05 PM  Result Value Ref Range   Glucose-Capillary 535 (H)  65 - 99 mg/dL    Physical Findings: AIMS: Facial and Oral Movements Muscles of Facial Expression: None, normal Lips and Perioral Area: None, normal Jaw: None, normal Tongue: None, normal,Extremity Movements Upper (arms, wrists, hands, fingers): None, normal Lower (legs, knees, ankles, toes): None, normal, Trunk Movements Neck, shoulders, hips: None, normal, Overall Severity Severity of abnormal movements (highest score from questions above): None, normal Incapacitation due to abnormal movements: None, normal Patient's awareness of abnormal movements (rate only patient's report): No Awareness, Dental Status Current problems with teeth and/or dentures?: No Does patient usually wear dentures?: No  CIWA:  CIWA-Ar Total: 5 COWS:     Treatment Plan Summary: Daily contact with patient to assess and evaluate symptoms and progress in treatment and Medication management Supportive  approach/coping skills Polysubstance dependence; continue Librium detox/work a relapse prevention plan Depression; continue the Zoloft 50 mg optimize dose response Work with CBT/mindfulness Medical Decision Making:  Review of Psycho-Social Stressors (1) and Review of Medication Regimen & Side Effects (2)     Mackie Holness A 09/01/2014, 5:31 PM

## 2014-09-01 NOTE — Progress Notes (Signed)
Recreation Therapy Notes  Date: 07.25.16 Time: 9:30 am Location: [address] Group Room  Group Topic: Stress Management  Goal Area(s) Addresses:  Patient will verbalize importance of using healthy stress management.  Patient will identify positive emotions associated with healthy stress management.   Intervention: Stress Management  Activity :  Progressive Muscle Relaxation.  LRT introduced and educated patients on stress management technique of progressive muscle relaxation.  A script was used to deliver the technique to patients.  Patients were asked to follow script read allowed by LRT to engage in practicing the stress management technique.  Education:  Stress Management, Discharge Planning.   Education Outcome: Acknowledges edcuation/In group clarification offered/Needs additional education  Clinical Observations/Feedback: Patient did not attend group.   Victorino Sparrow, LRT/CTRS         Ria Comment, Liv Rallis A 09/01/2014 1:18 PM
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
## 2014-09-01 NOTE — Progress Notes (Signed)
Pt attended the evening AA speaker meeting.

## 2014-09-01 NOTE — Progress Notes (Signed)
Patient ID: Jonathan Austin, male   DOB: Dec 03, 1965, 49 y.o.   MRN: [id number]  Pt currently presents with a flat affect and pelasant behavior. Pt encouraged to use and has been using a CPAP machine today. Pt CBG's have been labile, mid 300's this morning and reaching into mid 500's tonight. Pt has been cooperative and has attended groups. Pt speaks with his wife on the phone today.   Pt provided with medications per providers orders. Pt's labs and vitals were monitored throughout the day. Pt supported emotionally and encouraged to express concerns and questions. Pt educated on medications. Md and NP consulted about treatment. 20 units of insulin given as a one time order tonight at 1715.   Pt's safety ensured with 15 minute and environmental checks. Pt currently denies SI/HI and A/V hallucinations. Pt verbally agrees to seek staff if SI/HI or A/VH occurs and to consult with staff before acting on these thoughts. Will continue POC.

## 2014-09-01 NOTE — BHH Counselor (Signed)
CSW attempted to complete PSA with patient but he was asleep and would not awaken despite CSW prompting. CSW to complete PSA at a later time.  Tilden Fossa, MSW, [place] Worker [facility] [phone]

## 2014-09-02 DIAGNOSIS — F1020 Alcohol dependence, uncomplicated: Secondary | ICD-10-CM

## 2014-09-02 DIAGNOSIS — F1410 Cocaine abuse, uncomplicated: Secondary | ICD-10-CM

## 2014-09-02 LAB — GLUCOSE, CAPILLARY
GLUCOSE-CAPILLARY: 382 mg/dL — AB (ref 65–99)
Glucose-Capillary: 271 mg/dL — ABNORMAL HIGH (ref 65–99)
Glucose-Capillary: 379 mg/dL — ABNORMAL HIGH (ref 65–99)
Glucose-Capillary: 262 mg/dL — ABNORMAL HIGH (ref 65–99)
Glucose-Capillary: 410 mg/dL — ABNORMAL HIGH (ref 65–99)
Glucose-Capillary: 436 mg/dL — ABNORMAL HIGH (ref 65–99)

## 2014-09-02 LAB — HEMOGLOBIN A1C
Hgb A1c MFr Bld: 12.2 % — ABNORMAL HIGH (ref 4.8–5.6)
MEAN PLASMA GLUCOSE: 303 mg/dL

## 2014-09-02 MED ORDER — INSULIN ASPART 100 UNIT/ML ~~LOC~~ SOLN
0.0000 [IU] | Freq: Three times a day (TID) | SUBCUTANEOUS | Status: DC
  Administered 2014-09-03: 4 [IU] via SUBCUTANEOUS
  Administered 2014-09-03: 7 [IU] via SUBCUTANEOUS

## 2014-09-02 MED ORDER — INSULIN ASPART 100 UNIT/ML ~~LOC~~ SOLN
24.0000 [IU] | Freq: Once | SUBCUTANEOUS | Status: AC
  Administered 2014-09-02: 24 [IU] via SUBCUTANEOUS

## 2014-09-02 MED ORDER — AMLODIPINE BESYLATE 5 MG PO TABS
5.0000 mg | ORAL_TABLET | Freq: Every day | ORAL | Status: DC
  Administered 2014-09-02 – 2014-09-03 (×2): 5 mg via ORAL
  Filled 2014-09-02 (×2): qty 1
  Filled 2014-09-02: qty 3
  Filled 2014-09-02 (×2): qty 1

## 2014-09-02 MED ORDER — INSULIN DETEMIR 100 UNIT/ML ~~LOC~~ SOLN
32.0000 [IU] | Freq: Every day | SUBCUTANEOUS | Status: DC
  Administered 2014-09-02 – 2014-09-03 (×2): 32 [IU] via SUBCUTANEOUS

## 2014-09-02 NOTE — Progress Notes (Signed)
Recreation Therapy Notes  Animal-Assisted Activity (AAA) Program Checklist/Progress Notes Patient Eligibility Criteria Checklist & Daily Group note for Rec Tx Intervention  Date: 07.26.16 Time: 2:45 pm Location: [address]  AAA/T Program Assumption of Risk Form signed by Patient/ or Parent Legal Guardian yes  Patient is free of allergies or sever asthma yes  Patient reports no fear of animals yes  Patient reports no history of cruelty to animalsyes  Patient understands his/her participation is voluntary yes  Patient washes hands before animal contact yes  Patient washes hands after animal contact yes  Education: Hand Washing, Appropriate Animal Interaction   Education Outcome: Acknowledges understanding/In group clarification offered/Needs additional education.   Clinical Observations/Feedback:  Patient did not attend group.   Victorino Sparrow, LRT/CTRS         Ria Comment, Bart Ashford A 09/02/2014 4:10 PM

## 2014-09-02 NOTE — Progress Notes (Signed)
Pt attended the evening AA speaker meeting.

## 2014-09-02 NOTE — BHH Group Notes (Signed)
[place] LCSW Group Therapy 09/02/2014 1:15 PM Type of Therapy: Group Therapy Participation Level: Minimal  Participation Quality: Minimal  Affect: Depressed and Flat, Lethargic  Cognitive: Lethargic, Asleep  Insight: Developing/Improving and Engaged  Engagement in Therapy: Developing/Improving and Engaged  Modes of Intervention: Activity, Clarification, Confrontation, Discussion, Education, Exploration, Limit-setting, Orientation, Problem-solving, Rapport Building, Art therapist, Socialization and Support  Summary of Progress/Problems: Patient was attentive and engaged with speaker from [facility]. Patient was attentive to speaker while they shared their story of dealing with mental health and overcoming it. Patient expressed interest in their programs and services and received information on their agency. Patient processed ways they can relate to the speaker. Patient observed sleeping throughout majority of presentation.  Tilden Fossa, MSW, [place] Worker [facility] [phone]

## 2014-09-02 NOTE — Progress Notes (Signed)
D: Patient in the dayroom on approach.  Patient animated and states he had a good day.  Patient states he has multiple stressors but states he needs to find better ways to cope with his problems.  Patient states his wife is supportive.  Patient and Probation officer talked about diet and his elevate blood sugars.  Patient states he knows he needs to do better.  Patient uses his CPAP machine at night. Patient denies SI/HI and denies AVH. A: Staff to monitor Q 15 mins for safety.  Encouragement and support offered.  Scheduled medications administered per orders. R: Patient remains safe on the unit.  Patient attended group tonight.  Patient visible on the unit and interacting with peers.  Patient taking administered medications.

## 2014-09-02 NOTE — BHH Group Notes (Signed)
[place] Group Notes:  (Nursing/MHT/Case Management/Adjunct)  Date:  09/02/2014  Time:  0915  Type of Therapy:  Nurse Education  Participation Level:  Minimal  Participation Quality:  Drowsy  Affect:  Blunted  Cognitive:  Oriented  Insight:  Lacking  Engagement in Group:  Lacking  Modes of Intervention:  Education and Support  Summary of Progress/Problems: Jonathan Austin was quiet during group and nodded off at one point.    [name] 09/02/2014, 10:11 AM

## 2014-09-02 NOTE — Progress Notes (Signed)
[facility] MD Progress Note  09/02/2014 6:05 PM Jonathan Austin  MRN:  [id number] Subjective:  Jonathan Austin continues to be detox. States that once he completes the detox he can go home and follow up outpatient basis. States he just needs to go back to meetings. His wife does not drink and he does not drink at the house. States he has to go to the other part of town to get to the people he drinks with and he does not plan to do that. States he is committed. He does not live far from the [employer]. States he can walk to the meetings  Principal Problem: Substance induced mood disorder Diagnosis:   Patient Active Problem List   Diagnosis Date Noted  . Substance abuse [F19.10] 08/30/2014  . Substance induced mood disorder [F19.94] 05/07/2014  . Cocaine abuse with cocaine-induced mood disorder [F14.14]   . Alcohol abuse [F10.10]   . Suicidal ideations [R45.851]   . Acute respiratory failure [J96.00] 02/12/2014  . Fever [R50.9] 02/12/2014  . Sinus tachycardia [I47.1] 02/12/2014  . Cellulitis [L03.90] 06/28/2013  . Scrotal infection [N49.2] 06/28/2013  . Diabetes mellitus, type 2 [E11.9] 06/28/2013  . Cocaine abuse [F14.10] 06/28/2013  . Marijuana abuse [F12.10] 06/28/2013  . Leukocytosis [D72.829] 06/28/2013  . Hyponatremia [E87.1] 06/28/2013  . CAP (community acquired pneumonia) [J18.9] 12/09/2012  . Controlled diabetes mellitus type II without complication [E56.3] 14/97/0263   Total Time spent with patient: 30 minutes   Past Medical History:  Past Medical History  Diagnosis Date  . Diabetes mellitus   . Sleep apnea   . Depression     Past Surgical History  Procedure Laterality Date  . Tonsillectomy     Family History:  Family History  Problem Relation Age of Onset  . Hypertension Mother   . Hypertension Father   . Diabetes Father    Social History:  History  Alcohol Use  . 1.8 oz/week  . 1 Glasses of wine, 1 Cans of beer, 1 Shots of liquor per week    Comment: Occasionally      History  Drug Use  . Yes  . Special: Cocaine, Marijuana    History   Social History  . Marital Status: Married    Spouse Name: N/A  . Number of Children: N/A  . Years of Education: N/A   Social History Main Topics  . Smoking status: Never Smoker   . Smokeless tobacco: Never Used  . Alcohol Use: 1.8 oz/week    1 Glasses of wine, 1 Cans of beer, 1 Shots of liquor per week     Comment: Occasionally  . Drug Use: Yes    Special: Cocaine, Marijuana  . Sexual Activity: Yes   Other Topics Concern  . None   Social History Narrative   Additional History:    Sleep: Fair  Appetite:  Fair   Assessment:   Musculoskeletal: Strength & Muscle Tone: within normal limits Gait & Station: normal Patient leans: normal   Psychiatric Specialty Exam: Physical Exam  Review of Systems  Constitutional: Positive for malaise/fatigue.  HENT: Negative.   Eyes: Negative.   Respiratory: Negative.   Cardiovascular: Negative.   Gastrointestinal: Negative.   Genitourinary: Negative.   Musculoskeletal: Negative.   Skin: Negative.   Neurological: Negative.   Endo/Heme/Allergies: Negative.   Psychiatric/Behavioral: Positive for depression and substance abuse.    Blood pressure 108/74, pulse 94, temperature 97.6 F (36.4 C), temperature source Oral, resp. rate 18, height 5\' 7"  (1.702 m), weight 100.245 kg (  221 lb).Body mass index is 34.61 kg/(m^2).  General Appearance: Fairly Groomed  Engineer, water::  Fair  Speech:  Clear and Coherent  Volume:  Decreased  Mood:  Anxious and Depressed  Affect:  Restricted  Thought Process:  Coherent and Goal Directed  Orientation:  Full (Time, Place, and Person)  Thought Content:  symptoms events worries concerns  Suicidal Thoughts:  No  Homicidal Thoughts:  No  Memory:  Immediate;   Fair Recent;   Fair Remote;   Fair  Judgement:  Fair  Insight:  Present  Psychomotor Activity:  Decreased  Concentration:  Fair  Recall:  [employer] of  Knowledge:Fair  Language: Fair  Akathisia:  No  Handed:  Right  AIMS (if indicated):     Assets:  Desire for Improvement Housing Social Support  ADL's:  Intact  Cognition: WNL  Sleep:  Number of Hours: 6.75     Current Medications: Current Facility-Administered Medications  Medication Dose Route Frequency Provider Last Rate Last Dose  . acetaminophen (TYLENOL) tablet 650 mg  650 mg Oral Q6H PRN Kerrie Buffalo, NP   650 mg at 09/02/14 0900  . alum & mag hydroxide-simeth (MAALOX/MYLANTA) 200-200-20 MG/5ML suspension 30 mL  30 mL Oral Q4H PRN Kerrie Buffalo, NP      . amLODipine (NORVASC) tablet 5 mg  5 mg Oral Daily Encarnacion Slates, NP   5 mg at 09/02/14 1212  . chlordiazePOXIDE (LIBRIUM) capsule 25 mg  25 mg Oral BH-qamhs Kerrie Buffalo, NP   25 mg at 09/02/14 0855   Followed by  . [START ON 09/03/2014] chlordiazePOXIDE (LIBRIUM) capsule 25 mg  25 mg Oral Daily Kerrie Buffalo, NP      . glipiZIDE (GLUCOTROL) tablet 10 mg  10 mg Oral QAC breakfast Kerrie Buffalo, NP   10 mg at 09/02/14 5456  . insulin aspart (novoLOG) injection 0-15 Units  0-15 Units Subcutaneous TID WC Kerrie Buffalo, NP   15 Units at 09/02/14 1721  . insulin aspart (novoLOG) injection 0-5 Units  0-5 Units Subcutaneous QHS Kerrie Buffalo, NP   5 Units at 09/01/14 2205  . insulin detemir (LEVEMIR) injection 32 Units  32 Units Subcutaneous Daily Encarnacion Slates, NP   32 Units at 09/02/14 1209  . lisinopril (PRINIVIL,ZESTRIL) tablet 10 mg  10 mg Oral Daily Kerrie Buffalo, NP   10 mg at 09/02/14 0855  . magnesium hydroxide (MILK OF MAGNESIA) suspension 30 mL  30 mL Oral Daily PRN Kerrie Buffalo, NP      . metFORMIN (GLUCOPHAGE) tablet 500 mg  500 mg Oral Q breakfast Kerrie Buffalo, NP   500 mg at 09/02/14 0855  . multivitamin with minerals tablet 1 tablet  1 tablet Oral Daily Kerrie Buffalo, NP   1 tablet at 09/02/14 0855  . sertraline (ZOLOFT) tablet 50 mg  50 mg Oral Daily Kerrie Buffalo, NP   50 mg at 09/02/14 0855  .  simvastatin (ZOCOR) tablet 10 mg  10 mg Oral q1800 Nicholaus Bloom, MD   10 mg at 09/02/14 1708  . thiamine (VITAMIN B-1) tablet 100 mg  100 mg Oral Daily Kerrie Buffalo, NP   100 mg at 09/02/14 0855  . traZODone (DESYREL) tablet 50 mg  50 mg Oral QHS PRN Kerrie Buffalo, NP   50 mg at 09/01/14 2205    Lab Results:  Results for orders placed or performed during the hospital encounter of 08/30/14 (from the past 48 hour(s))  Lipid panel     Status: Abnormal  Collection Time: 08/31/14  7:31 PM  Result Value Ref Range   Cholesterol 265 (H) 0 - 200 mg/dL   Triglycerides 351 (H) <150 mg/dL   HDL 49 >40 mg/dL   Total CHOL/HDL Ratio 5.4 RATIO   VLDL 70 (H) 0 - 40 mg/dL   LDL Cholesterol 146 (H) 0 - 99 mg/dL    Comment:        Total Cholesterol/HDL:CHD Risk Coronary Heart Disease Risk Table                     Men   Women  1/2 Average Risk   3.4   3.3  Average Risk       5.0   4.4  2 X Average Risk   9.6   7.1  3 X Average Risk  23.4   11.0        Use the calculated Patient Ratio above and the CHD Risk Table to determine the patient's CHD Risk.        ATP III CLASSIFICATION (LDL):  <100     mg/dL   Optimal  100-129  mg/dL   Near or Above                    Optimal  130-159  mg/dL   Borderline  160-189  mg/dL   High  >190     mg/dL   Very High Performed at [facility]   Hemoglobin A1c     Status: Abnormal   Collection Time: 08/31/14  7:31 PM  Result Value Ref Range   Hgb A1c MFr Bld 12.2 (H) 4.8 - 5.6 %    Comment: (NOTE)         Pre-diabetes: 5.7 - 6.4         Diabetes: >6.4         Glycemic control for adults with diabetes: <7.0    Mean Plasma Glucose 303 mg/dL    Comment: (NOTE) Performed At: [facility] [address] [place], [place] [id number] Lindon Romp MD XN:[id number] Performed at [facility]   Glucose, capillary     Status: Abnormal   Collection Time: 08/31/14  9:11 PM  Result Value Ref Range   Glucose-Capillary 362 (H)  65 - 99 mg/dL   Comment 1 Notify RN    Comment 2 Document in Chart   Glucose, capillary     Status: Abnormal   Collection Time: 09/01/14  6:24 AM  Result Value Ref Range   Glucose-Capillary 334 (H) 65 - 99 mg/dL   Comment 1 Notify RN    Comment 2 Document in Chart   Glucose, capillary     Status: Abnormal   Collection Time: 09/01/14 12:10 PM  Result Value Ref Range   Glucose-Capillary 340 (H) 65 - 99 mg/dL  Glucose, capillary     Status: Abnormal   Collection Time: 09/01/14  5:05 PM  Result Value Ref Range   Glucose-Capillary 535 (H) 65 - 99 mg/dL  Glucose, capillary     Status: Abnormal   Collection Time: 09/01/14  9:08 PM  Result Value Ref Range   Glucose-Capillary 380 (H) 65 - 99 mg/dL   Comment 1 Notify RN   Glucose, capillary     Status: Abnormal   Collection Time: 09/02/14  1:05 AM  Result Value Ref Range   Glucose-Capillary 271 (H) 65 - 99 mg/dL  Glucose, capillary     Status: Abnormal   Collection Time: 09/02/14  6:21 AM  Result Value Ref Range   Glucose-Capillary 379 (H) 65 - 99 mg/dL  Glucose, capillary     Status: Abnormal   Collection Time: 09/02/14 12:05 PM  Result Value Ref Range   Glucose-Capillary 262 (H) 65 - 99 mg/dL   Comment 1 Notify RN   Glucose, capillary     Status: Abnormal   Collection Time: 09/02/14  5:11 PM  Result Value Ref Range   Glucose-Capillary 410 (H) 65 - 99 mg/dL   Comment 1 Notify RN   Glucose, capillary     Status: Abnormal   Collection Time: 09/02/14  5:18 PM  Result Value Ref Range   Glucose-Capillary 382 (H) 65 - 99 mg/dL   Comment 1 Notify RN     Physical Findings: AIMS: Facial and Oral Movements Muscles of Facial Expression: None, normal Lips and Perioral Area: None, normal Jaw: None, normal Tongue: None, normal,Extremity Movements Upper (arms, wrists, hands, fingers): None, normal Lower (legs, knees, ankles, toes): None, normal, Trunk Movements Neck, shoulders, hips: None, normal, Overall Severity Severity of abnormal  movements (highest score from questions above): None, normal Incapacitation due to abnormal movements: None, normal Patient's awareness of abnormal movements (rate only patient's report): No Awareness, Dental Status Current problems with teeth and/or dentures?: No Does patient usually wear dentures?: No  CIWA:  CIWA-Ar Total: 0 COWS:     Treatment Plan Summary: Daily contact with patient to assess and evaluate symptoms and progress in treatment and Medication management Supportive approach/coping skills Alcohol dependence; continue alcohol detox protocol Cocaine abuse; work a relapse prevention plan Depression; continue to work with the Zoloft Use CBT/mindfulness  Medical Decision Making:  Review of Psycho-Social Stressors (1) and Review of Medication Regimen & Side Effects (2)     Jonathan Austin A 09/02/2014, 6:05 PM

## 2014-09-02 NOTE — Progress Notes (Signed)
D: Jonathan Austin has been calm, cooperative, and compliant with medications. He has been observed sleeping off and on in the dayroom throughout the day, including during groups. He denied SI/HI and AVH. He rated his sleep good, appetite good, energy low, and concentration good. He rated his depression a 5, hopelessness a 4, and anxiety a 5. He has indicated he wants to work on his recovery and read his AA book. He is worrying about his wife. Noon CIWA was zero. A: Meds given as ordered. Q15 safety checks maintained. Support/encouragement offered. R: Pt remains free from harm and continues with treatment. Will continue to monitor for needs/safety.

## 2014-09-02 NOTE — Tx Team (Addendum)
Interdisciplinary Treatment Plan Update (Adult) Date: 09/02/2014    Time Reviewed: 9:30 AM  Progress in Treatment: Attending groups: Yes Participating in groups: Minimally- patient often lethargic and observed sleeping during groups Taking medication as prescribed: Yes Tolerating medication: Yes Family/Significant other contact made: Yes, CSW has spoken with patient's wife Patient understands diagnosis: Yes Discussing patient identified problems/goals with staff: Yes Medical problems stabilized or resolved: Yes Denies suicidal/homicidal ideation: Yes Issues/concerns per patient self-inventory: Yes Other:  New problem(s) identified: N/A  Discharge Plan or Barriers: 09/02/2014:  Patient plans to return home to follow up with outpatient services at the [facility].  Reason for Continuation of Hospitalization:  Depression Anxiety Medication Stabilization   Comments: N/A  Estimated length of stay: 2-3 days   Pt is 49 year old male who presents to [facility] due to SI, ETOH/crack cocaine/marijuana abuse, increased depression and anxiety, and for medication stabilization. Pt reports daily alcohol and crack cocaine abuse. Patient last at [facility] in March 2016 for similar complaints. Pt reports hx at [facility] about 4 years ago "I completed 60 days there and did well for awhile." Patient plans to return home to follow up with [facility] program. Recommendations for pt include: crisis stabilization, therapeutic milieu, encourage group attendance and participation, medication management for mood stabilization, and development of comprehensive mental wellness/sobriety plan.     Review of initial/current patient goals per problem list:  1. Goal(s): Patient will participate in aftercare plan   Met: Yes   Target date: 2-3 days   As evidenced by: Patient will participate within aftercare plan AEB aftercare provider and housing plan at discharge being identified.  7/26: Goal met:   Patient plans to return home to follow up with outpatient services at the [facility].   2. Goal (s): Patient will exhibit decreased depressive symptoms and suicidal ideations.   Met: Yes   Target date: 2-3 days   As evidenced by: Patient will utilize self rating of depression at 3 or below and demonstrate decreased signs of depression or be deemed stable for discharge by MD.  7/26: Goal not met: Pt presents with flat affect and depressed mood.  Pt admitted with depression rating of 10.  Pt to show decreased sign of depression and a rating of 3 or less before d/c.    7/27: Adequate for discharge: Patient denies SI and reports feeling safe for discharge.    Attendees: Patient:    Family:    Physician: Dr. Cobos; Dr. Lugo 09/02/2014 9:30 AM  Nursing: Karen Shugart RN 09/02/2014 9:30 AM  Clinical Social Worker:  ,  LCSWA 09/02/2014 9:30 AM  Other: Lauren Carter, LCSWA 09/02/2014 9:30 AM  Other: Valerie Enoch, [facility] Liaison 09/02/2014 9:30 AM  Other:  Jennifer Clark, RN CM 09/02/2014 9:30 AM  Other: Aggie Nwoko, Laura Davis, NP 09/02/2014 9:30 AM  Other:      Scribe for Treatment Team:   , MSW, LCSWA [phone]     

## 2014-09-02 NOTE — [facility] Suicide Risk Assessment (Signed)
[facility] INPATIENT:  Family/Significant Other Suicide Prevention Education  Suicide Prevention Education:  Education Completed; Wife Ransome Helwig [phone],  (name of family member/significant other) has been identified by the patient as the family member/significant other with whom the patient will be residing, and identified as the person(s) who will aid the patient in the event of a mental health crisis (suicidal ideations/suicide attempt).  With written consent from the patient, the family member/significant other has been provided the following suicide prevention education, prior to the and/or following the discharge of the patient.  The suicide prevention education provided includes the following:  Suicide risk factors  Suicide prevention and interventions  National Suicide Hotline telephone number  [facility] assessment telephone number  [facility] Emergency Assistance [place] and/or Residential Mobile Crisis Unit telephone number  Request made of family/significant other to:  Remove weapons (e.g., guns, rifles, knives), all items previously/currently identified as safety concern.    Remove drugs/medications (over-the-counter, prescriptions, illicit drugs), all items previously/currently identified as a safety concern.  The family member/significant other verbalizes understanding of the suicide prevention education information provided.  The family member/significant other agrees to remove the items of safety concern listed above.  Uno Esau, Casimiro Needle 09/02/2014, 10:17 AM

## 2014-09-02 NOTE — Progress Notes (Signed)
Inpatient Diabetes Program Recommendations  AACE/ADA: New Consensus Statement on Inpatient Glycemic Control (2013)  Target Ranges:  Prepandial:   less than 140 mg/dL      Peak postprandial:   less than 180 mg/dL (1-2 hours)      Critically ill patients:  140 - 180 mg/dL   Results for Jonathan Austin, Jonathan Austin (MRN [id number]) as of 09/02/2014 09:50  Ref. Range 08/31/2014 06:02 08/31/2014 12:17 08/31/2014 17:22 08/31/2014 21:11  Glucose-Capillary Latest Ref Range: 65-99 mg/dL 315 (H) 351 (H) 425 (H) 362 (H)   Results for Jonathan Austin, Jonathan Austin (MRN [id number]) as of 09/02/2014 09:50  Ref. Range 09/01/2014 06:24 09/01/2014 12:10 09/01/2014 17:05 09/01/2014 21:08  Glucose-Capillary Latest Ref Range: 65-99 mg/dL 334 (H) 340 (H) 535 (H) 380 (H)   Results for Jonathan Austin, Jonathan Austin (MRN [id number]) as of 09/02/2014 09:50  Ref. Range 09/02/2014 06:21  Glucose-Capillary Latest Ref Range: 65-99 mg/dL 379 (H)    Admit with: Polysubstance Abuse/ Suicidal Thoughts/ Depression  History: DM  Home DM Meds: Levemir 40 units daily       Novolog 4-12 units bid per SSI       Metformin 500 mg daily       Glipizide 10 mg daily  Current DM Orders: Metformin 500 mg daily            Glipizide 10 mg daily            Novolog Moderate SSI (0-15 units) TID AC + HS     MD- Patient takes Levemir insulin at home.  Please start at least 80% of patient's home dose of Levemir.  Levemir 32 units daily would be 80% of home dose.  Please start today.     Will follow Wyn Quaker RN, MSN, CDE Diabetes Coordinator Inpatient Glycemic Control Team Team Pager: [phone] (8a-5p)

## 2014-09-03 LAB — GLUCOSE, CAPILLARY
GLUCOSE-CAPILLARY: 227 mg/dL — AB (ref 65–99)
Glucose-Capillary: 179 mg/dL — ABNORMAL HIGH (ref 65–99)
Glucose-Capillary: 239 mg/dL — ABNORMAL HIGH (ref 65–99)

## 2014-09-03 MED ORDER — LORATADINE 10 MG PO TABS: 10.0000 mg | ORAL_TABLET | Freq: Every day | ORAL | Status: DC

## 2014-09-03 MED ORDER — METFORMIN HCL 500 MG PO TABS: 500.0000 mg | ORAL_TABLET | Freq: Every day | ORAL | Status: DC

## 2014-09-03 MED ORDER — SIMVASTATIN 10 MG PO TABS: 10.0000 mg | ORAL_TABLET | Freq: Every day | ORAL | Status: DC

## 2014-09-03 MED ORDER — INSULIN DETEMIR 100 UNIT/ML ~~LOC~~ SOLN: 10.0000 [IU] | SUBCUTANEOUS | Status: DC

## 2014-09-03 MED ORDER — AMLODIPINE BESYLATE 5 MG PO TABS: 5.0000 mg | ORAL_TABLET | Freq: Every day | ORAL | Status: DC

## 2014-09-03 MED ORDER — HYDROXYZINE HCL 25 MG PO TABS
25.0000 mg | ORAL_TABLET | Freq: Four times a day (QID) | ORAL | Status: DC | PRN
  Filled 2014-09-03 (×2): qty 12

## 2014-09-03 MED ORDER — SERTRALINE HCL 50 MG PO TABS: 50.0000 mg | ORAL_TABLET | Freq: Every day | ORAL | Status: DC

## 2014-09-03 MED ORDER — TRAZODONE HCL 50 MG PO TABS: 50.0000 mg | ORAL_TABLET | Freq: Every evening | ORAL | Status: DC | PRN

## 2014-09-03 MED ORDER — GLIPIZIDE 10 MG PO TABS: 10.0000 mg | ORAL_TABLET | Freq: Every day | ORAL | Status: DC

## 2014-09-03 MED ORDER — INSULIN ASPART 100 UNIT/ML ~~LOC~~ SOLN: 4.0000 [IU] | Freq: Two times a day (BID) | SUBCUTANEOUS | Status: DC

## 2014-09-03 MED ORDER — LISINOPRIL 10 MG PO TABS: 10.0000 mg | ORAL_TABLET | Freq: Every day | ORAL | Status: DC

## 2014-09-03 NOTE — Progress Notes (Signed)
  [facility] Adult Case Management Discharge Plan :  Will you be returning to the same living situation after discharge:  Yes,  Patient plans to return home At discharge, do you have transportation home?: Yes,  Patient provided with bus pass Do you have the ability to pay for your medications: Yes,  patient will be provided with prescriptions at discharge  Release of information consent forms completed and in the chart;  Patient's signature needed at discharge.  Patient to Follow up at: Follow-up Information    Follow up with [facility] On 09/09/2014.   Why:  Assessment with Dr. Tamera Reason for Intensive Outpatient Program on Tuesday Aug. 2nd at 10:45am. Please bring insurance card and call office if you need to reschedule.   Contact information:   [address],  [place], [place] [address] [phone]      Patient denies SI/HI: Yes,  denies    Safety Planning and Suicide Prevention discussed: Yes,  with patient and wife  Have you used any form of tobacco in the last 30 days? (Cigarettes, Smokeless Tobacco, Cigars, and/or Pipes): No  Has patient been referred to the Quitline?: N/A patient is not a smoker  Thad Osoria, Erasmo Downer L 09/03/2014, 2:49 PM

## 2014-09-03 NOTE — [facility] Group Notes (Signed)
[facility] LCSW Aftercare Discharge Planning Group Note  09/03/2014  8:45 AM  Participation Quality: Did Not Attend. Patient invited to participate but declined.  Tilden Fossa, MSW, [place] Worker [facility] [phone]

## 2014-09-03 NOTE — Progress Notes (Signed)
Patient cbg rechecked tonight and it was 239.

## 2014-09-03 NOTE — Discharge Summary (Signed)
Physician Discharge Summary Note  Patient:  Jonathan Austin is an 49 y.o., male MRN:  [id number] DOB:  August 04, 1965 Patient phone:  [phone] (home)  Patient address:   [place] [address],  Total Time spent with patient: Greater than 30 minutes  Date of Admission:  08/30/2014 Date of Discharge: 09/03/2014  Reason for Admission:  Per H&P admission:    Patient presents with complaints of suicidal thoughts and plan to cut wrist or drive car off of a bridge. Patient also has complaints of substance abuse stating he drinks three 40 oz. beers daily, smokes marijuana 2-3 times a week and uses cocaine daily. His main stressor is that he is unemployed and having financial problems along with needing work done to his car. Patient expressed feelings of worthlessness, helplessness, irritability, depression, social withdrawal, and loss of interest. Patient lives with his wife who is his primary support and his son and recently lost his son and a step son in 20 which he states he is still grieving the loss. Patient has a prior history inpatient hospitalization at [facility] 04/[facility]. Patient states that he is not taking his medications as he should and denies outpatient services at this time.  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 States other than the suicide attempt there is no self injurious behavior. States that he has no guns in his home or access to guns. Denies homicidal  ideation or violent history. Patient states when he is getting high he has some paranoia and my hear voice like someone calling his name; but when he is not high he doesn't hear voices and does not have any paranoia.  Patient states that he has a history of psych hospital "same thing; I came here (suicide/depression) 04/2014." Patient states "I wasn't taking my medicine like I should. Cause of drinking"  Patient states that the last time when he cut his wrist it did bleed "The last time I cut across my wrist it didn't bleed; I believe that God is keeping me here for a reason. My sons that died they have sons; and I need to be here for my grand kids; just had one to celebrate a birthday. But I have been to 6 funerals in the last year in half. 2 sons; 2 cousins, great aunt, and brother in law. Patient states that his wife is a good support system "she is a Theme park manager."  Principal Problem: Substance induced mood disorder Discharge Diagnoses: Patient Active Problem List   Diagnosis Date Noted  . Substance abuse [F19.10] 08/30/2014  . Substance induced mood disorder [F19.94] 05/07/2014  . Cocaine abuse with cocaine-induced mood disorder [F14.14]   . Alcohol abuse [F10.10]   . Suicidal ideations [R45.851]   . Acute respiratory failure [J96.00] 02/12/2014  . Fever [R50.9] 02/12/2014  . Sinus tachycardia [I47.1] 02/12/2014  . Cellulitis [L03.90] 06/28/2013  . Scrotal infection [N49.2] 06/28/2013  . Diabetes mellitus, type 2 [E11.9]  06/28/2013  . Cocaine abuse [F14.10] 06/28/2013  . Marijuana abuse [F12.10] 06/28/2013  . Leukocytosis [D72.829] 06/28/2013  . Hyponatremia [E87.1] 06/28/2013  . CAP (community acquired pneumonia) [J18.9] 12/09/2012  . Controlled diabetes mellitus type II without complication [J18.8] 41/66/0630    Musculoskeletal: Strength & Muscle Tone: within normal limits Gait & Station: normal Patient leans: N/A  Psychiatric Specialty Exam: Physical Exam  Constitutional:  He is oriented to person, place, and time.  Neck: Normal range of motion.  Respiratory: Effort normal.  Musculoskeletal: Normal range of motion.  Neurological: He is alert and oriented to person, place, and time.    Review of Systems  Psychiatric/Behavioral: Positive for depression (Stable) and substance abuse (Cocaine). Negative for suicidal ideas, hallucinations and memory loss. The patient is nervous/anxious (Stable). Insomnia: Stable.   All other systems reviewed and are negative.   Blood pressure 128/83, pulse 106, temperature 97.9 F (36.6 C), temperature source Oral, resp. rate 18, height 5\' 7"  (1.702 m), weight 100.245 kg (221 lb).Body mass index is 34.61 kg/(m^2).   SEE MD PSE within the [facility]  Past Medical History:  Past Medical History  Diagnosis Date  . Diabetes mellitus   . Sleep apnea   . Depression     Past Surgical History  Procedure Laterality Date  . Tonsillectomy     Family History:  Family History  Problem Relation Age of Onset  . Hypertension Mother   . Hypertension Father   . Diabetes Father    Social History:  History  Alcohol Use  . 1.8 oz/week  . 1 Glasses of wine, 1 Cans of beer, 1 Shots of liquor per week    Comment: Occasionally     History  Drug Use  . Yes  . Special: Cocaine, Marijuana    History   Social History  . Marital Status: Married    Spouse Name: N/A  . Number of Children: N/A  . Years of Education: N/A   Social History Main Topics  . Smoking status: Never Smoker   . Smokeless tobacco: Never Used  . Alcohol Use: 1.8 oz/week    1 Glasses of wine, 1 Cans of beer, 1 Shots of liquor per week     Comment: Occasionally  . Drug Use: Yes    Special: Cocaine, Marijuana  . Sexual Activity: Yes   Other Topics Concern  . None   Social History Narrative    Risk to Self: Is patient at risk for suicide?: No Risk to Others:   Prior Inpatient Therapy:   Prior Outpatient Therapy:    Level of Care:  OP  Hospital Course:    [name] was admitted for Substance induced mood disorder, and crisis management.  Pt was treated discharged with the medications listed below under Medication List.  Medical problems were identified and treated as needed.  Home medications were restarted as appropriate.  Improvement was monitored by observation and [name] 's daily report of symptom reduction.  Emotional and mental status was monitored by daily self-inventory reports completed by [name] and clinical staff.         [name] was evaluated by the treatment team for stability and plans for continued recovery upon discharge. [name] 's motivation was an integral factor for scheduling further treatment. Employment, transportation, bed availability, health status, family support, and any pending legal issues were also considered during hospital stay. Pt was offered further treatment options upon discharge including but not limited to Residential, Intensive Outpatient, and Outpatient  treatment.  [name] will follow up with the services as listed below under Follow Up Information.     Upon completion of this admission the patient was both mentally and medically stable for discharge denying suicidal/homicidal ideation, auditory/visual/tactile hallucinations, delusional thoughts and paranoia.     Consults:  psychiatry  Significant Diagnostic Studies:  UDS + for cocaine and THC, BAL 90, Glucose consistently high but diabetes coordinator assisted in adjusting long-acting insulins with plan for pt to follow-up at home. Cholesterol 265, Triglycerides 351, LDL 146, VLDL 70, Creatinine 1.25  Discharge Vitals:   Blood pressure 128/83, pulse 106, temperature 97.9 F (36.6 C), temperature source Oral, resp. rate 18, height 5\' 7"  (1.702 m), weight 100.245 kg (221 lb). Body mass index is 34.61 kg/(m^2). Lab Results:   Results for orders placed or performed during the hospital encounter of 08/30/14 (from the  past 72 hour(s))  Glucose, capillary     Status: Abnormal   Collection Time: 08/31/14 12:17 PM  Result Value Ref Range   Glucose-Capillary 351 (H) 65 - 99 mg/dL  Glucose, capillary     Status: Abnormal   Collection Time: 08/31/14  5:22 PM  Result Value Ref Range   Glucose-Capillary 425 (H) 65 - 99 mg/dL  Lipid panel     Status: Abnormal   Collection Time: 08/31/14  7:31 PM  Result Value Ref Range   Cholesterol 265 (H) 0 - 200 mg/dL   Triglycerides 351 (H) <150 mg/dL   HDL 49 >40 mg/dL   Total CHOL/HDL Ratio 5.4 RATIO   VLDL 70 (H) 0 - 40 mg/dL   LDL Cholesterol 146 (H) 0 - 99 mg/dL    Comment:        Total Cholesterol/HDL:CHD Risk Coronary Heart Disease Risk Table                     Men   Women  1/2 Average Risk   3.4   3.3  Average Risk       5.0   4.4  2 X Average Risk   9.6   7.1  3 X Average Risk  23.4   11.0        Use the calculated Patient Ratio above and the CHD Risk Table to determine the patient's CHD Risk.        ATP III CLASSIFICATION (LDL):  <100     mg/dL   Optimal  100-129  mg/dL   Near or Above                    Optimal  130-159  mg/dL   Borderline  160-189  mg/dL   High  >190     mg/dL   Very High Performed at  [facility]   Hemoglobin A1c     Status: Abnormal   Collection Time: 08/31/14  7:31 PM  Result Value Ref Range   Hgb A1c MFr Bld 12.2 (H) 4.8 - 5.6 %    Comment: (NOTE)         Pre-diabetes: 5.7 - 6.4         Diabetes: >6.4         Glycemic control for adults with diabetes: <7.0    Mean Plasma Glucose 303 mg/dL    Comment: (NOTE) Performed At: [facility] [place], [place] [id number] Lindon Romp MD EZ:[id number] Performed at [facility]   Glucose, capillary     Status: Abnormal   Collection  Time: 08/31/14  9:11 PM  Result Value Ref Range   Glucose-Capillary 362 (H) 65 - 99 mg/dL   Comment 1 Notify RN    Comment 2 Document in Chart   Glucose, capillary     Status: Abnormal    Collection Time: 09/01/14  6:24 AM  Result Value Ref Range   Glucose-Capillary 334 (H) 65 - 99 mg/dL   Comment 1 Notify RN    Comment 2 Document in Chart   Glucose, capillary     Status: Abnormal   Collection Time: 09/01/14 12:10 PM  Result Value Ref Range   Glucose-Capillary 340 (H) 65 - 99 mg/dL  Glucose, capillary     Status: Abnormal   Collection Time: 09/01/14  5:05 PM  Result Value Ref Range   Glucose-Capillary 535 (H) 65 - 99 mg/dL  Glucose, capillary     Status: Abnormal   Collection Time: 09/01/14  9:08 PM  Result Value Ref Range   Glucose-Capillary 380 (H) 65 - 99 mg/dL   Comment 1 Notify RN   Glucose, capillary     Status: Abnormal   Collection Time: 09/02/14  1:05 AM  Result Value Ref Range   Glucose-Capillary 271 (H) 65 - 99 mg/dL  Glucose, capillary     Status: Abnormal   Collection Time: 09/02/14  6:21 AM  Result Value Ref Range   Glucose-Capillary 379 (H) 65 - 99 mg/dL  Glucose, capillary     Status: Abnormal   Collection Time: 09/02/14 12:05 PM  Result Value Ref Range   Glucose-Capillary 262 (H) 65 - 99 mg/dL   Comment 1 Notify RN   Glucose, capillary     Status: Abnormal   Collection Time: 09/02/14  5:11 PM  Result Value Ref Range   Glucose-Capillary 410 (H) 65 - 99 mg/dL   Comment 1 Notify RN   Glucose, capillary     Status: Abnormal   Collection Time: 09/02/14  5:18 PM  Result Value Ref Range   Glucose-Capillary 382 (H) 65 - 99 mg/dL   Comment 1 Notify RN   Glucose, capillary     Status: Abnormal   Collection Time: 09/02/14  9:09 PM  Result Value Ref Range   Glucose-Capillary 436 (H) 65 - 99 mg/dL   Comment 1 Notify RN   Glucose, capillary     Status: Abnormal   Collection Time: 09/03/14 12:19 AM  Result Value Ref Range   Glucose-Capillary 239 (H) 65 - 99 mg/dL    Physical Findings: AIMS: Facial and Oral Movements Muscles of Facial Expression: None, normal Lips and Perioral Area: None, normal Jaw: None, normal Tongue: None, normal,Extremity  Movements Upper (arms, wrists, hands, fingers): None, normal Lower (legs, knees, ankles, toes): None, normal, Trunk Movements Neck, shoulders, hips: None, normal, Overall Severity Severity of abnormal movements (highest score from questions above): None, normal Incapacitation due to abnormal movements: None, normal Patient's awareness of abnormal movements (rate only patient's report): No Awareness, Dental Status Current problems with teeth and/or dentures?: No Does patient usually wear dentures?: No  CIWA:  CIWA-Ar Total: 0 COWS:      See Psychiatric Specialty Exam and Suicide Risk Assessment completed by Attending Physician prior to discharge.  Discharge destination:  Home  Is patient on multiple antipsychotic therapies at discharge:  No   Has Patient had three or more failed trials of antipsychotic monotherapy by history:  No    Recommended Plan for Multiple Antipsychotic Therapies: NA     Medication List    STOP  taking these medications        fluticasone 50 MCG/ACT nasal spray  Commonly known as:  FLONASE      TAKE these medications      Indication   albuterol 108 (90 BASE) MCG/ACT inhaler  Commonly known as:  PROVENTIL HFA;VENTOLIN HFA  Inhale 1-2 puffs into the lungs every 6 (six) hours as needed for wheezing or shortness of breath.      amLODipine 5 MG tablet  Commonly known as:  NORVASC  Take 1 tablet (5 mg total) by mouth daily.   Indication:  High Blood Pressure     glipiZIDE 10 MG tablet  Commonly known as:  GLUCOTROL  Take 1 tablet (10 mg total) by mouth daily before breakfast.   Indication:  Type 2 Diabetes     hydrOXYzine 25 MG tablet  Commonly known as:  ATARAX/VISTARIL  Take 1 tablet (25 mg total) by mouth every 6 (six) hours as needed for anxiety.   Indication:  anxiety     insulin aspart 100 UNIT/ML injection  Commonly known as:  novoLOG  Inject 4-12 Units into the skin 2 (two) times daily before a meal. Sliding scale   Indication:  Type 2  Diabetes     insulin detemir 100 UNIT/ML injection  Commonly known as:  LEVEMIR  Inject 0.1-0.4 mLs (10-40 Units total) into the skin as directed. By your family medical provider   Indication:  Type 2 Diabetes     lisinopril 10 MG tablet  Commonly known as:  PRINIVIL,ZESTRIL  Take 1 tablet (10 mg total) by mouth daily.   Indication:  High Blood Pressure     loratadine 10 MG tablet  Commonly known as:  CLARITIN  Take 1 tablet (10 mg total) by mouth daily.   Indication:  Hayfever     metFORMIN 500 MG tablet  Commonly known as:  GLUCOPHAGE  Take 1 tablet (500 mg total) by mouth daily.   Indication:  Type 2 Diabetes     sertraline 50 MG tablet  Commonly known as:  ZOLOFT  Take 1 tablet (50 mg total) by mouth daily. For depression   Indication:  Major Depressive Disorder     simvastatin 10 MG tablet  Commonly known as:  ZOCOR  Take 1 tablet (10 mg total) by mouth daily at 6 PM. For Hyperlipidemia   Indication:  Type II A Hyperlipidemia, Hyperlipidema     traZODone 50 MG tablet  Commonly known as:  DESYREL  Take 1 tablet (50 mg total) by mouth at bedtime as needed for sleep.   Indication:  Trouble Sleeping       Follow-up Information    Follow up with [facility] On 09/09/2014.   Why:  Assessment with Dr. Tamera Reason for Intensive Outpatient Program on Tuesday Aug. 2nd at 10:45am. Please bring insurance card and call office if you need to reschedule.   Contact information:   [address],  [place], [place] [address] [phone]      Follow-up recommendations:  Activity:  As tolerated Diet:  As tolerated  Comments:    Take all medications as prescribed. Keep all follow-up appointments as scheduled.  Do not consume alcohol or use illegal drugs while on prescription medications. Report any adverse effects from your medications to your primary care provider promptly.  In the event of recurrent symptoms or worsening symptoms, call 911, a crisis hotline, or go to the nearest  emergency department for evaluation.   Total Discharge Time: Greater than 30  minutes  Signed: Benjamine Mola, FNP-BC 09/03/2014, 10:44 AM  I personally assessed the patient and formulated the plan Geralyn Flash A. Sabra Heck, M.D.

## 2014-09-03 NOTE — Progress Notes (Signed)
Patient asleep with CPAP in place. Denying physical issues. BP elevated on first check this morning however lower on recheck. Affect appropriate, mood stable. Medicated per orders with education provided. Support given. He denies Si/HI. Awaiting discharge. Jamie Kato

## 2014-09-03 NOTE — Progress Notes (Signed)
Patient packed and ready for discharge. Teaching explained by Reeves Forth, RN. Rx's given along with sample meds. All belongings returned including CPAP. Patient verbalized understanding. Denies SI/HI. Discharged in stable condition with bus pass. Jamie Kato

## 2014-09-03 NOTE — Progress Notes (Signed)
Recreation Therapy Notes  Date: 07.27.16 Time: 9:30 am Location: [address] Group Room  Group Topic: Stress Management  Goal Area(s) Addresses:  Patient will verbalize importance of using healthy stress management.  Patient will identify positive emotions associated with healthy stress management.   Intervention: Stress Management  Activity :  Guided Automotive engineer.  LRT introduced the technique of guided imagery.  A script was used to deliver the technique to the patients.  Patients were asked to follow the script read a loud by LRT to engage in the technique of guided imagery.  Education:  Stress Management, Discharge Planning.   Education Outcome: Acknowledges edcuation/In group clarification offered/Needs additional education  Clinical Observations/Feedback: Patient did not attend group.   Victorino Sparrow, LRT/CTRS         Victorino Sparrow A 09/03/2014 12:17 PM

## 2014-09-03 NOTE — [facility] Suicide Risk Assessment (Signed)
[facility] Discharge Suicide Risk Assessment   Demographic Factors:  Male  Total Time spent with patient: 30 minutes  Musculoskeletal: Strength & Muscle Tone: within normal limits Gait & Station: normal Patient leans: normal  Psychiatric Specialty Exam: Physical Exam  Review of Systems  Constitutional: Negative.   HENT: Negative.   Eyes: Negative.   Respiratory: Negative.   Cardiovascular: Negative.   Gastrointestinal: Negative.   Genitourinary: Negative.   Musculoskeletal: Negative.   Skin: Negative.   Neurological: Negative.   Endo/Heme/Allergies: Negative.   Psychiatric/Behavioral: Positive for substance abuse.    Blood pressure 128/83, pulse 106, temperature 97.9 F (36.6 C), temperature source Oral, resp. rate 18, height 5\' 7"  (1.702 m), weight 100.245 kg (221 lb).Body mass index is 34.61 kg/(m^2).  General Appearance: Fairly Groomed  Engineer, water::  Fair  Speech:  Clear and [id number]  Volume:  Decreased  Mood:  Euthymic  Affect:  Appropriate  Thought Process:  Coherent and Goal Directed  Orientation:  Full (Time, Place, and Person)  Thought Content:  plans as he moves on, relapse prevention plan  Suicidal Thoughts:  No  Homicidal Thoughts:  No  Memory:  Immediate;   Fair Recent;   Fair Remote;   Fair  Judgement:  Fair  Insight:  Present  Psychomotor Activity:  Normal  Concentration:  Fair  Recall:  [employer]  Language: Fair  Akathisia:  No  Handed:  Right  AIMS (if indicated):     Assets:  Desire for Improvement Housing Social Support  Sleep:  Number of Hours: 6.5  Cognition: WNL  ADL's:  Intact   Have you used any form of tobacco in the last 30 days? (Cigarettes, Smokeless Tobacco, Cigars, and/or Pipes): No  Has this patient used any form of tobacco in the last 30 days? (Cigarettes, Smokeless Tobacco, Cigars, and/or Pipes) No  Mental Status Per Nursing Assessment::   On Admission:  NA  Current Mental Status by Physician: In full  contact with reality. There are no active SI plans or intent. He states he is ready to do the work. He plans to go back to AA. States he lives close to the [employer] and can walk to meetings. He is going to pursue a CD IOP trough [facility]   Loss Factors: Decline in physical health  Historical Factors: NA  Risk Reduction Factors:   Sense of responsibility to family, Living with another person, especially a relative and Positive social support  Continued Clinical Symptoms:  Alcohol/Substance Abuse/Dependencies  Cognitive Features That Contribute To Risk:  None    Suicide Risk:  Minimal: No identifiable suicidal ideation.  Patients presenting with no risk factors but with morbid ruminations; may be classified as minimal risk based on the severity of the depressive symptoms  Principal Problem: Substance induced mood disorder Discharge Diagnoses:  Patient Active Problem List   Diagnosis Date Noted  . Substance abuse [F19.10] 08/30/2014  . Substance induced mood disorder [F19.94] 05/07/2014  . Cocaine abuse with cocaine-induced mood disorder [F14.14]   . Alcohol abuse [F10.10]   . Suicidal ideations [R45.851]   . Acute respiratory failure [J96.00] 02/12/2014  . Fever [R50.9] 02/12/2014  . Sinus tachycardia [I47.1] 02/12/2014  . Cellulitis [L03.90] 06/28/2013  . Scrotal infection [N49.2] 06/28/2013  . Diabetes mellitus, type 2 [E11.9] 06/28/2013  . Cocaine abuse [F14.10] 06/28/2013  . Marijuana abuse [F12.10] 06/28/2013  . Leukocytosis [D72.829] 06/28/2013  . Hyponatremia [E87.1] 06/28/2013  . CAP (community acquired pneumonia) [J18.9] 12/09/2012  .  Controlled diabetes mellitus type II without complication [G62.6] 94/85/4627    Follow-up Information    Follow up with [facility] On 09/09/2014.   Why:  Assessment with Dr. Tamera Reason for Intensive Outpatient Program on Tuesday Aug. 2nd at 10:45am. Please bring insurance card and call office if you need to reschedule.    Contact information:   [address],  [place], [place] [address] [phone]      Plan Of Care/Follow-up recommendations:  Activity:  as tolerated Diet:  rgular Follow up [place] Is patient on multiple antipsychotic therapies at discharge:  No   Has Patient had three or more failed trials of antipsychotic monotherapy by history:  No  Recommended Plan for Multiple Antipsychotic Therapies: NA    Daysen Gundrum A 09/03/2014, 11:42 AM

## 2014-09-03 NOTE — Progress Notes (Signed)
Patient blood sugar 436 tonight.  Patriciaann Clan Pa notified and new orders received.  Patient asymptomatic and states he ate country fried steak with gravy and lemonade at dinner.  Patient states he also drank coffee tonight and he is putting regular sugar in it.  Patient and Probation officer talked about better food choices and drinks to help with blood sugar and patient states he understands.

## 2014-12-17 ENCOUNTER — Emergency Department (HOSPITAL_COMMUNITY)
Admission: EM | Admit: 2014-12-17 | Discharge: 2014-12-17 | Disposition: A | Discharge status: Home / Self Care | Payer: 59 | Attending: Emergency Medicine | Admitting: Emergency Medicine

## 2014-12-17 ENCOUNTER — Encounter (HOSPITAL_COMMUNITY): Payer: Self-pay | Admitting: Emergency Medicine

## 2014-12-17 DIAGNOSIS — E1165 Type 2 diabetes mellitus with hyperglycemia: Secondary | ICD-10-CM | POA: Insufficient documentation

## 2014-12-17 DIAGNOSIS — F329 Major depressive disorder, single episode, unspecified: Secondary | ICD-10-CM | POA: Insufficient documentation

## 2014-12-17 DIAGNOSIS — Z79899 Other long term (current) drug therapy: Secondary | ICD-10-CM | POA: Insufficient documentation

## 2014-12-17 DIAGNOSIS — Z794 Long term (current) use of insulin: Secondary | ICD-10-CM | POA: Insufficient documentation

## 2014-12-17 DIAGNOSIS — R739 Hyperglycemia, unspecified: Secondary | ICD-10-CM

## 2014-12-17 DIAGNOSIS — Z8669 Personal history of other diseases of the nervous system and sense organs: Secondary | ICD-10-CM | POA: Insufficient documentation

## 2014-12-17 LAB — BASIC METABOLIC PANEL
Anion gap: 12 (ref 5–15)
BUN: 16 mg/dL (ref 6–20)
CHLORIDE: 94 mmol/L — AB (ref 101–111)
CO2: 24 mmol/L (ref 22–32)
CREATININE: 1.46 mg/dL — AB (ref 0.61–1.24)
Calcium: 9.4 mg/dL (ref 8.9–10.3)
GFR calc Af Amer: >60 mL/min (ref >60)
GFR calc non Af Amer: 55 mL/min — ABNORMAL LOW (ref >60)
Glucose, Bld: 537 mg/dL — ABNORMAL HIGH (ref 65–99)
Potassium: 4.7 mmol/L (ref 3.5–5.1)
SODIUM: 130 mmol/L — AB (ref 135–145)

## 2014-12-17 LAB — CBC
HEMATOCRIT: 46.7 % (ref 39.0–52.0)
Hemoglobin: 16.1 g/dL (ref 13.0–17.0)
MCH: 30.4 pg (ref 26.0–34.0)
MCHC: 34.5 g/dL (ref 30.0–36.0)
MCV: 88.1 fL (ref 78.0–100.0)
PLATELETS: 292 10*3/uL (ref 150–400)
RBC: 5.3 MIL/uL (ref 4.22–5.81)
RDW: 12.7 % (ref 11.5–15.5)
WBC: 5.7 10*3/uL (ref 4.0–10.5)

## 2014-12-17 LAB — URINALYSIS, ROUTINE W REFLEX MICROSCOPIC
BILIRUBIN URINE: NEGATIVE
Glucose, UA: >1000 mg/dL — AB
Hgb urine dipstick: NEGATIVE
KETONES UR: 15 mg/dL — AB
Leukocytes, UA: NEGATIVE
Nitrite: NEGATIVE
Protein, ur: NEGATIVE mg/dL
Specific Gravity, Urine: 1.036 — ABNORMAL HIGH (ref 1.005–1.030)
UROBILINOGEN UA: 0.2 mg/dL (ref 0.0–1.0)
pH: 5.5 (ref 5.0–8.0)

## 2014-12-17 LAB — CBG MONITORING, ED
GLUCOSE-CAPILLARY: 555 mg/dL — AB (ref 65–99)
GLUCOSE-CAPILLARY: 222 mg/dL — AB (ref 65–99)
Glucose-Capillary: 484 mg/dL — ABNORMAL HIGH (ref 65–99)

## 2014-12-17 LAB — URINE MICROSCOPIC-ADD ON

## 2014-12-17 MED ORDER — INSULIN LISPRO 100 UNIT/ML (KWIKPEN): 4.0000 [IU] | PEN_INJECTOR | Freq: Two times a day (BID) | SUBCUTANEOUS | Status: DC

## 2014-12-17 MED ORDER — SODIUM CHLORIDE 0.9 % IV SOLN
1000.0000 mL | INTRAVENOUS | Status: DC
  Administered 2014-12-17: 1000 mL via INTRAVENOUS

## 2014-12-17 MED ORDER — INSULIN ASPART 100 UNIT/ML ~~LOC~~ SOLN
10.0000 [IU] | Freq: Once | SUBCUTANEOUS | Status: AC
  Administered 2014-12-17: 10 [IU] via INTRAVENOUS
  Filled 2014-12-17: qty 1

## 2014-12-17 MED ORDER — SODIUM CHLORIDE 0.9 % IV SOLN
1000.0000 mL | Freq: Once | INTRAVENOUS | Status: AC
  Administered 2014-12-17: 1000 mL via INTRAVENOUS

## 2014-12-17 NOTE — ED Notes (Signed)
MD made aware CBG drop after 1 L. Reports still give IV insulin and additional bolus.

## 2014-12-17 NOTE — Discharge Instructions (Signed)
Use the Humalog in place of the Novalog. Keep your appointment with your primary care provider.  Hyperglycemia Hyperglycemia occurs when the glucose (sugar) in your blood is too high. Hyperglycemia can happen for many reasons, but it most often happens to people who do not know they have diabetes or are not managing their diabetes properly.  CAUSES  Whether you have diabetes or not, there are other causes of hyperglycemia. Hyperglycemia can occur when you have diabetes, but it can also occur in other situations that you might not be as aware of, such as: Diabetes  If you have diabetes and are having problems controlling your blood glucose, hyperglycemia could occur because of some of the following reasons:  Not following your meal plan.  Not taking your diabetes medications or not taking it properly.  Exercising less or doing less activity than you normally do.  Being sick. Pre-diabetes  This cannot be ignored. Before people develop Type 2 diabetes, they almost always have "pre-diabetes." This is when your blood glucose levels are higher than normal, but not yet high enough to be diagnosed as diabetes. Research has shown that some long-term damage to the body, especially the heart and circulatory system, may already be occurring during pre-diabetes. If you take action to manage your blood glucose when you have pre-diabetes, you may delay or prevent Type 2 diabetes from developing. Stress  If you have diabetes, you may be "diet" controlled or on oral medications or insulin to control your diabetes. However, you may find that your blood glucose is higher than usual in the hospital whether you have diabetes or not. This is often referred to as "stress hyperglycemia." Stress can elevate your blood glucose. This happens because of hormones put out by the body during times of stress. If stress has been the cause of your high blood glucose, it can be followed regularly by your caregiver. That way  he/she can make sure your hyperglycemia does not continue to get worse or progress to diabetes. Steroids  Steroids are medications that act on the infection fighting system (immune system) to block inflammation or infection. One side effect can be a rise in blood glucose. Most people can produce enough extra insulin to allow for this rise, but for those who cannot, steroids make blood glucose levels go even higher. It is not unusual for steroid treatments to "uncover" diabetes that is developing. It is not always possible to determine if the hyperglycemia will go away after the steroids are stopped. A special blood test called an A1c is sometimes done to determine if your blood glucose was elevated before the steroids were started. SYMPTOMS  Thirsty.  Frequent urination.  Dry mouth.  Blurred vision.  Tired or fatigue.  Weakness.  Sleepy.  Tingling in feet or leg. DIAGNOSIS  Diagnosis is made by monitoring blood glucose in one or all of the following ways:  A1c test. This is a chemical found in your blood.  Fingerstick blood glucose monitoring.  Laboratory results. TREATMENT  First, knowing the cause of the hyperglycemia is important before the hyperglycemia can be treated. Treatment may include, but is not be limited to:  Education.  Change or adjustment in medications.  Change or adjustment in meal plan.  Treatment for an illness, infection, etc.  More frequent blood glucose monitoring.  Change in exercise plan.  Decreasing or stopping steroids.  Lifestyle changes. HOME CARE INSTRUCTIONS   Test your blood glucose as directed.  Exercise regularly. Your caregiver will give you instructions  about exercise. Pre-diabetes or diabetes which comes on with stress is helped by exercising.  Eat wholesome, balanced meals. Eat often and at regular, fixed times. Your caregiver or nutritionist will give you a meal plan to guide your sugar intake.  Being at an ideal weight is  important. If needed, losing as little as 10 to 15 pounds may help improve blood glucose levels. SEEK MEDICAL CARE IF:   You have questions about medicine, activity, or diet.  You continue to have symptoms (problems such as increased thirst, urination, or weight gain). SEEK IMMEDIATE MEDICAL CARE IF:   You are vomiting or have diarrhea.  Your breath smells fruity.  You are breathing faster or slower.  You are very sleepy or incoherent.  You have numbness, tingling, or pain in your feet or hands.  You have chest pain.  Your symptoms get worse even though you have been following your caregiver's orders.  If you have any other questions or concerns.   This information is not intended to replace advice given to you by your health care provider. Make sure you discuss any questions you have with your health care provider.   Document Released: 07/20/2000 Document Revised: 04/18/2011 Document Reviewed: 09/30/2014 Elsevier Interactive Patient Education 2016 [place].  Insulin Lispro injection What is this medicine? INSULIN LISPRO (IN su lin LYE sproe) is a human-made form of insulin. This drug lowers the amount of sugar in your blood. This medicine is a rapid-acting insulin that starts working faster than regular insulin. It will not work as long as regular insulin. This medicine may be used for other purposes; ask your health care provider or pharmacist if you have questions. What should I tell my health care provider before I take this medicine? They need to know if you have any of these conditions: -episodes of hypoglycemia -kidney disease -liver disease -an unusual or allergic reaction to insulin, metacresol, other medicines, foods, dyes, or preservatives -pregnant or trying to get pregnant -breast-feeding How should I use this medicine? This medicine is for injection under the skin or infusion into a vein. This medicine may be given by health care professional in a hospital  or clinic setting. It is important to follow the directions given to you by your health care professional or doctor. You should inject this medicine within 15 minutes before or after your meal. Have food ready before injection. Do not delay eating. You will be taught how to use this medicine and how to adjust doses for activities and illness. Do not use more insulin than prescribed. Do not use more or less often than prescribed. Always check the appearance of your insulin before using it. This medicine should be clear and colorless like water. Do not use it if it is cloudy, thickened, colored, or has solid particles in it. It is important that you put your used needles and syringes in a special sharps container. Do not put them in a trash can. If you do not have a sharps container, call your pharmacist or healthcare provider to get one. Talk to your pediatrician regarding the use of this medicine in children. Special care may be needed. Overdosage: If you think you have taken too much of this medicine contact a poison control center or emergency room at once. NOTE: This medicine is only for you. Do not share this medicine with others. What if I miss a dose? It is important not to miss a dose. Your health care professional or doctor should discuss a  plan for missed doses with you. If you do miss a dose, follow their plan. Do not take double doses. What may interact with this medicine? -other medicines for diabetes Many medications may cause an increase or decrease in blood sugar, these include: -alcohol containing beverages -aspirin and aspirin-like drugs -chloramphenicol -chromium -diuretics -male hormones, like estrogens or progestins and birth control pills -heart medicines -isoniazid -male hormones or anabolic steroids -medicines for weight loss -medicines for allergies, asthma, cold, or cough -medicines for mental problems -medicines called MAO Inhibitors like Nardil, Parnate, Marplan,  Eldepryl -niacin -NSAIDs, medicines for pain and inflammation, like ibuprofen or naproxen -pentamidine -phenytoin -probenecid -quinolone antibiotics like ciprofloxacin, levofloxacin, ofloxacin -some herbal dietary supplements -steroid medicines like prednisone or cortisone -thyroid medicine Some medications can hide the warning symptoms of low blood sugar. You may need to monitor your blood sugar more closely if you are taking one of these medications. These include: -beta-blockers such as atenolol, metoprolol, propranolol -clonidine -guanethidine -reserpine This list may not describe all possible interactions. Give your health care provider a list of all the medicines, herbs, non-prescription drugs, or dietary supplements you use. Also tell them if you smoke, drink alcohol, or use illegal drugs. Some items may interact with your medicine. What should I watch for while using this medicine? Visit your health care professional or doctor for regular checks on your progress. A test called the HbA1C (A1C) will be monitored. This is a simple blood test. It measures your blood sugar control over the last 2 to 3 months. You will receive this test every 3 to 6 months. Learn how to check your blood sugar. Learn the symptoms of low and high blood sugar and how to manage them. Always carry a quick-source of sugar with you in case you have symptoms of low blood sugar. Examples include hard sugar candy or glucose tablets. Make sure others know that you can choke if you eat or drink when you develop serious symptoms of low blood sugar, such as seizures or unconsciousness. They must get medical help at once. Tell your doctor or health care professional if you have high blood sugar. You might need to change the dose of your medicine. If you are sick or exercising more than usual, you might need to change the dose of your medicine. Do not skip meals. Ask your doctor or health care professional if you should  avoid alcohol. Many nonprescription cough and cold products contain sugar or alcohol. These can affect blood sugar. Make sure that you have the right kind of syringe for the type of insulin you use. Try not to change the brand and type of insulin or syringe unless your health care professional or doctor tells you to. Switching insulin brand or type can cause dangerously high or low blood sugar. Always keep an extra supply of insulin, syringes, and needles on hand. Use a syringe one time only. Throw away syringe and needle in a closed container to prevent accidental needle sticks. Insulin pens and cartridges should never be shared. Even if the needle is changed, sharing may result in passing of viruses like hepatitis or HIV. Wear a medical ID bracelet or chain, and carry a card that describes your disease and details of your medicine and dosage times. What side effects may I notice from receiving this medicine? Side effects that you should report to your health care professional or doctor as soon as possible: -allergic reactions like skin rash, itching or hives, swelling of the face, lips,  or tongue -breathing problems -signs and symptoms of high blood sugar such as dizziness, dry mouth, dry skin, fruity breath, nausea, stomach pain, increased hunger or thirst, increased urination -signs and symptoms of low blood sugar such as feeling anxious, confusion, dizziness, increased hunger, unusually weak or tired, sweating, shakiness, cold, irritable, headache, blurred vision, fast heartbeat, loss of consciousness Side effects that usually do not require medical attention (report to your health care professional or doctor if they continue or are bothersome): -increase or decrease in fatty tissue under the skin due to overuse of a particular injection site -itching, burning, swelling, or rash at site where injected This list may not describe all possible side effects. Call your doctor for medical advice about  side effects. You may report side effects to FDA at 1-800-FDA-1088. Where should I keep my medicine? Keep out of the reach of children. Store unopened insulin vials in a refrigerator between 2 and 8 degrees C (36 and 46 degrees F). Do not freeze or use if the insulin has been frozen. Opened vials (vials currently in use) may be stored in the refrigerator or at room temperature, at approximately 30 degrees C (86 degrees F) or cooler. Keeping your insulin at room temperature decreases the amount of pain during injection. Once opened, your insulin can be used for 28 days. After 28 days, the vial of insulin should be thrown away. Store unopened cartridges or disposable pens in a refrigerator between 2 and 8 degrees C (36 and 46 degrees F.) Do not freeze or use if the insulin has been frozen. Once opened, the disposable pens and cartridges that are inserted into pens should be kept at room temperature, approximately 30 degrees C (80 degrees F) or cooler. Do not store in the refrigerator. Once opened, the insulin can be used for 28 days. After 28 days, the cartridge or disposable pen should be thrown away. Protect from light and excessive heat. Throw away any unused medicine after the expiration date or after the specified time for room temperature storage has passed. NOTE: This sheet is a summary. It may not cover all possible information. If you have questions about this medicine, talk to your doctor, pharmacist, or health care provider.    2016, Elsevier/[employer] Standard. (2013-04-04 10:02:00)

## 2014-12-17 NOTE — ED Provider Notes (Signed)
CSN: [id number]     Arrival date & time 12/17/14  1551 History   None    Chief Complaint  Patient presents with  . Hyperglycemia    The patient said he is trying to coordinate MD appts so that he can get his insulin.  The patient said he took his blood sugar today and it is 450.  He advised he has been out of insulin for about two weeks.     (Consider location/radiation/quality/duration/timing/severity/associated sxs/prior Treatment) Patient is a 49 y.o. male presenting with hyperglycemia. The history is provided by the patient.  Hyperglycemia He is diabetic and takes Levemir at night and NovoLog before meals. He states that he has not been able to get the NovoLog filled for the last 2 months because it is too expensive and not covered by his insurance. He states Humalog as covered. Blood sugar today was over 450. He has noted urinary frequency and thirst. He denies abdominal pain, nausea, vomiting. He denies fever chills.  Past Medical History  Diagnosis Date  . Diabetes mellitus   . Sleep apnea   . Depression    Past Surgical History  Procedure Laterality Date  . Tonsillectomy     Family History  Problem Relation Age of Onset  . Hypertension Mother   . Hypertension Father   . Diabetes Father    Social History  Substance Use Topics  . Smoking status: Never Smoker   . Smokeless tobacco: Never Used  . Alcohol Use: 1.8 oz/week    1 Glasses of wine, 1 Cans of beer, 1 Shots of liquor per week     Comment: Occasionally    Review of Systems  All other systems reviewed and are negative.     Allergies  Review of patient's allergies indicates no known allergies.  Home Medications   Prior to Admission medications   Medication Sig Start Date End Date Taking? Authorizing Provider  albuterol (PROVENTIL HFA;VENTOLIN HFA) 108 (90 BASE) MCG/ACT inhaler Inhale 1-2 puffs into the lungs every 6 (six) hours as needed for wheezing or shortness of breath.    Historical Provider, MD   amLODipine (NORVASC) 5 MG tablet Take 1 tablet (5 mg total) by mouth daily. 09/03/14   Benjamine Mola, FNP  glipiZIDE (GLUCOTROL) 10 MG tablet Take 1 tablet (10 mg total) by mouth daily before breakfast. 09/03/14   Benjamine Mola, FNP  hydrOXYzine (ATARAX/VISTARIL) 25 MG tablet Take 1 tablet (25 mg total) by mouth every 6 (six) hours as needed for anxiety. 05/08/14   Shuvon B Rankin, NP  insulin aspart (NOVOLOG) 100 UNIT/ML injection Inject 4-12 Units into the skin 2 (two) times daily before a meal. Sliding scale 09/03/14   Benjamine Mola, FNP  insulin detemir (LEVEMIR) 100 UNIT/ML injection Inject 0.1-0.4 mLs (10-40 Units total) into the skin as directed. By your family medical provider 09/03/14   Benjamine Mola, FNP  lisinopril (PRINIVIL,ZESTRIL) 10 MG tablet Take 1 tablet (10 mg total) by mouth daily. 09/03/14   Benjamine Mola, FNP  loratadine (CLARITIN) 10 MG tablet Take 1 tablet (10 mg total) by mouth daily. 09/03/14   Benjamine Mola, FNP  metFORMIN (GLUCOPHAGE) 500 MG tablet Take 1 tablet (500 mg total) by mouth daily. 09/03/14   Benjamine Mola, FNP  sertraline (ZOLOFT) 50 MG tablet Take 1 tablet (50 mg total) by mouth daily. For depression 09/03/14   Benjamine Mola, FNP  simvastatin (ZOCOR) 10 MG tablet Take 1 tablet (10 mg total)  by mouth daily at 6 PM. For Hyperlipidemia 09/03/14   Benjamine Mola, FNP  traZODone (DESYREL) 50 MG tablet Take 1 tablet (50 mg total) by mouth at bedtime as needed for sleep. 09/03/14   Elyse Jarvis Withrow, FNP   BP 149/95 mmHg  Pulse 131  Temp(Src) 97.7 F (36.5 C) (Oral)  Resp 20  SpO2 96% Physical Exam  Nursing note and vitals reviewed.  49 year old male, resting comfortably and in no acute distress. Vital signs are significant for tachycardia and hypertension. Oxygen saturation is 96%, which is normal. Head is normocephalic and atraumatic. PERRLA, EOMI. Oropharynx is clear. Neck is nontender and supple without adenopathy or JVD. Back is nontender and there is no CVA  tenderness. Lungs are clear without rales, wheezes, or rhonchi. Chest is nontender. Heart is tachycardic without murmur. Abdomen is soft, flat, nontender without masses or hepatosplenomegaly and peristalsis is normoactive. Extremities have no cyanosis or edema, full range of motion is present. Skin is warm and dry without rash. Neurologic: Mental status is normal, cranial nerves are intact, there are no motor or sensory deficits.  ED Course  Procedures (including critical care time) Labs Review Results for orders placed or performed during the hospital encounter of 96/29/52  Basic metabolic panel  Result Value Ref Range   Sodium 130 (L) 135 - 145 mmol/L   Potassium 4.7 3.5 - 5.1 mmol/L   Chloride 94 (L) 101 - 111 mmol/L   CO2 24 22 - 32 mmol/L   Glucose, Bld 537 (H) 65 - 99 mg/dL   BUN 16 6 - 20 mg/dL   Creatinine, Ser 1.46 (H) 0.61 - 1.24 mg/dL   Calcium 9.4 8.9 - 10.3 mg/dL   GFR calc non Af Amer 55 (L) >60 mL/min   GFR calc Af Amer >60 >60 mL/min   Anion gap 12 5 - 15  CBC  Result Value Ref Range   WBC 5.7 4.0 - 10.5 K/uL   RBC 5.30 4.22 - 5.81 MIL/uL   Hemoglobin 16.1 13.0 - 17.0 g/dL   HCT 46.7 39.0 - 52.0 %   MCV 88.1 78.0 - 100.0 fL   MCH 30.4 26.0 - 34.0 pg   MCHC 34.5 30.0 - 36.0 g/dL   RDW 12.7 11.5 - 15.5 %   Platelets 292 150 - 400 K/uL  Urinalysis, Routine w reflex microscopic (not at [facility])  Result Value Ref Range   Color, Urine YELLOW YELLOW   APPearance CLEAR CLEAR   Specific Gravity, Urine 1.036 (H) 1.005 - 1.030   pH 5.5 5.0 - 8.0   Glucose, UA >1000 (A) NEGATIVE mg/dL   Hgb urine dipstick NEGATIVE NEGATIVE   Bilirubin Urine NEGATIVE NEGATIVE   Ketones, ur 15 (A) NEGATIVE mg/dL   Protein, ur NEGATIVE NEGATIVE mg/dL   Urobilinogen, UA 0.2 0.0 - 1.0 mg/dL   Nitrite NEGATIVE NEGATIVE   Leukocytes, UA NEGATIVE NEGATIVE  Urine microscopic-add on  Result Value Ref Range   Squamous Epithelial / LPF RARE RARE   WBC, UA 0-2 <3 WBC/hpf  CBG monitoring, ED   Result Value Ref Range   Glucose-Capillary 555 (HH) 65 - 99 mg/dL   Comment 1 Notify RN   CBG monitoring, ED  Result Value Ref Range   Glucose-Capillary 484 (H) 65 - 99 mg/dL  POC CBG, ED  Result Value Ref Range   Glucose-Capillary 222 (H) 65 - 99 mg/dL   I have personally reviewed and evaluated these lab results as part of my medical  decision-making.   MDM   Final diagnoses:  Hyperglycemia    Hyperglycemia. He'll be treated with IV fluids and IV insulin. We'll need to assess heart rate with hydration.  Blood sugars come down to 222 with hydration and insulin. He is given a prescription for lispro insulin which his insurance will cover.  Delora Fuel, MD 61/95/09 [id number]

## 2014-12-17 NOTE — ED Notes (Signed)
Checked patient blood sugar it was 555 notfied RN of blood sugar

## 2014-12-17 NOTE — ED Notes (Signed)
The patient said he is trying to coordinate MD appts so that he can get his insulin.  The patient said he took his blood sugar today and it is 450.  He advised he has been out of insulin for about two weeks.  He says he is not scheduled for an MD appt until the 17th of November.  He is here to be evaluated.

## 2014-12-17 NOTE — ED Notes (Signed)
CBG- 222 

## 2014-12-17 NOTE — ED Notes (Signed)
BS=555

## 2015-09-20 ENCOUNTER — Observation Stay (HOSPITAL_COMMUNITY)
Admission: EM | Admit: 2015-09-20 | Discharge: 2015-09-21 | Disposition: A | Discharge status: Home / Self Care | Payer: Self-pay | Attending: Student in an Organized Health Care Education/Training Program | Admitting: Student in an Organized Health Care Education/Training Program

## 2015-09-20 ENCOUNTER — Encounter (HOSPITAL_COMMUNITY): Payer: Self-pay | Admitting: Emergency Medicine

## 2015-09-20 DIAGNOSIS — F1010 Alcohol abuse, uncomplicated: Secondary | ICD-10-CM | POA: Diagnosis present

## 2015-09-20 DIAGNOSIS — E1101 Type 2 diabetes mellitus with hyperosmolarity with coma: Secondary | ICD-10-CM

## 2015-09-20 DIAGNOSIS — I10 Essential (primary) hypertension: Secondary | ICD-10-CM

## 2015-09-20 DIAGNOSIS — R739 Hyperglycemia, unspecified: Secondary | ICD-10-CM

## 2015-09-20 DIAGNOSIS — E1165 Type 2 diabetes mellitus with hyperglycemia: Principal | ICD-10-CM | POA: Insufficient documentation

## 2015-09-20 DIAGNOSIS — E860 Dehydration: Secondary | ICD-10-CM

## 2015-09-20 DIAGNOSIS — G4730 Sleep apnea, unspecified: Secondary | ICD-10-CM

## 2015-09-20 DIAGNOSIS — IMO0002 Reserved for concepts with insufficient information to code with codable children: Secondary | ICD-10-CM | POA: Diagnosis present

## 2015-09-20 DIAGNOSIS — E1100 Type 2 diabetes mellitus with hyperosmolarity without nonketotic hyperglycemic-hyperosmolar coma (NKHHC): Secondary | ICD-10-CM

## 2015-09-20 DIAGNOSIS — E785 Hyperlipidemia, unspecified: Secondary | ICD-10-CM

## 2015-09-20 DIAGNOSIS — E1142 Type 2 diabetes mellitus with diabetic polyneuropathy: Secondary | ICD-10-CM | POA: Diagnosis present

## 2015-09-20 DIAGNOSIS — Z7984 Long term (current) use of oral hypoglycemic drugs: Secondary | ICD-10-CM | POA: Insufficient documentation

## 2015-09-20 DIAGNOSIS — F1020 Alcohol dependence, uncomplicated: Secondary | ICD-10-CM

## 2015-09-20 DIAGNOSIS — Z9989 Dependence on other enabling machines and devices: Secondary | ICD-10-CM

## 2015-09-20 LAB — I-STAT VENOUS BLOOD GAS, ED
ACID-BASE EXCESS: 5 mmol/L — AB (ref 0.0–2.0)
Bicarbonate: 32.2 mEq/L — ABNORMAL HIGH (ref 20.0–24.0)
O2 SAT: 84 %
PCO2 VEN: 54.6 mmHg — AB (ref 45.0–50.0)
PO2 VEN: 51 mmHg — AB (ref 31.0–45.0)
TCO2: 34 mmol/L (ref 0–100)
pH, Ven: 7.378 — ABNORMAL HIGH (ref 7.250–7.300)

## 2015-09-20 LAB — BASIC METABOLIC PANEL
ANION GAP: 12 (ref 5–15)
ANION GAP: 10 (ref 5–15)
BUN: 15 mg/dL (ref 6–20)
BUN: 15 mg/dL (ref 6–20)
CALCIUM: 9.6 mg/dL (ref 8.9–10.3)
CALCIUM: 9.5 mg/dL (ref 8.9–10.3)
CHLORIDE: 93 mmol/L — AB (ref 101–111)
CO2: 26 mmol/L (ref 22–32)
CO2: 26 mmol/L (ref 22–32)
CREATININE: 1.54 mg/dL — AB (ref 0.61–1.24)
Chloride: 86 mmol/L — ABNORMAL LOW (ref 101–111)
Creatinine, Ser: 1.33 mg/dL — ABNORMAL HIGH (ref 0.61–1.24)
GFR calc non Af Amer: >60 mL/min (ref >60)
GFR, EST AFRICAN AMERICAN: 59 mL/min — AB (ref >60)
GFR, EST NON AFRICAN AMERICAN: 51 mL/min — AB (ref >60)
GLUCOSE: 533 mg/dL — AB (ref 65–99)
Glucose, Bld: 900 mg/dL (ref 65–99)
Potassium: 5 mmol/L (ref 3.5–5.1)
Potassium: 4.3 mmol/L (ref 3.5–5.1)
Sodium: 124 mmol/L — ABNORMAL LOW (ref 135–145)
Sodium: 129 mmol/L — ABNORMAL LOW (ref 135–145)

## 2015-09-20 LAB — CBC
HCT: 44.9 % (ref 39.0–52.0)
HEMOGLOBIN: 14.8 g/dL (ref 13.0–17.0)
MCH: 30 pg (ref 26.0–34.0)
MCHC: 33 g/dL (ref 30.0–36.0)
MCV: 91.1 fL (ref 78.0–100.0)
PLATELETS: 337 10*3/uL (ref 150–400)
RBC: 4.93 MIL/uL (ref 4.22–5.81)
RDW: 13.1 % (ref 11.5–15.5)
WBC: 5.8 10*3/uL (ref 4.0–10.5)

## 2015-09-20 LAB — MRSA PCR SCREENING: MRSA by PCR: NEGATIVE

## 2015-09-20 LAB — URINALYSIS, ROUTINE W REFLEX MICROSCOPIC
Bilirubin Urine: NEGATIVE
Glucose, UA: >1000 mg/dL — AB
Hgb urine dipstick: NEGATIVE
Ketones, ur: NEGATIVE mg/dL
Leukocytes, UA: NEGATIVE
NITRITE: NEGATIVE
PROTEIN: NEGATIVE mg/dL
SPECIFIC GRAVITY, URINE: 1.027 (ref 1.005–1.030)
pH: 6 (ref 5.0–8.0)

## 2015-09-20 LAB — URINE MICROSCOPIC-ADD ON
BACTERIA UA: NONE SEEN
RBC / HPF: NONE SEEN RBC/hpf (ref 0–5)

## 2015-09-20 LAB — GLUCOSE, CAPILLARY
GLUCOSE-CAPILLARY: 187 mg/dL — AB (ref 65–99)
GLUCOSE-CAPILLARY: 296 mg/dL — AB (ref 65–99)
GLUCOSE-CAPILLARY: 95 mg/dL (ref 65–99)

## 2015-09-20 LAB — RAPID URINE DRUG SCREEN, HOSP PERFORMED
AMPHETAMINES: NOT DETECTED
Barbiturates: NOT DETECTED
Benzodiazepines: NOT DETECTED
COCAINE: POSITIVE — AB
OPIATES: NOT DETECTED
TETRAHYDROCANNABINOL: NOT DETECTED

## 2015-09-20 LAB — OSMOLALITY: Osmolality: 321 mOsm/kg (ref 275–295)

## 2015-09-20 LAB — CBG MONITORING, ED
Glucose-Capillary: >600 mg/dL (ref 65–99)
Glucose-Capillary: >600 mg/dL (ref 65–99)

## 2015-09-20 MED ORDER — LORATADINE 10 MG PO TABS: 10.0000 mg | ORAL_TABLET | Freq: Every day | ORAL | Status: DC

## 2015-09-20 MED ORDER — INSULIN ASPART 100 UNIT/ML ~~LOC~~ SOLN
10.0000 [IU] | Freq: Once | SUBCUTANEOUS | Status: AC
  Administered 2015-09-20: 10 [IU] via INTRAVENOUS

## 2015-09-20 MED ORDER — AMLODIPINE BESYLATE 5 MG PO TABS
5.0000 mg | ORAL_TABLET | Freq: Every day | ORAL | Status: DC
  Administered 2015-09-21: 5 mg via ORAL
  Filled 2015-09-20: qty 1

## 2015-09-20 MED ORDER — HYDROXYZINE HCL 25 MG PO TABS: 25.0000 mg | ORAL_TABLET | Freq: Four times a day (QID) | ORAL | Status: DC | PRN

## 2015-09-20 MED ORDER — ATORVASTATIN CALCIUM 40 MG PO TABS: 40.0000 mg | ORAL_TABLET | Freq: Every day | ORAL | Status: DC

## 2015-09-20 MED ORDER — ENOXAPARIN SODIUM 40 MG/0.4ML ~~LOC~~ SOLN
40.0000 mg | SUBCUTANEOUS | Status: DC
  Administered 2015-09-20: 40 mg via SUBCUTANEOUS
  Filled 2015-09-20: qty 0.4

## 2015-09-20 MED ORDER — SODIUM CHLORIDE 0.9 % IV BOLUS (SEPSIS)
1000.0000 mL | Freq: Once | INTRAVENOUS | Status: AC
  Administered 2015-09-20: 1000 mL via INTRAVENOUS

## 2015-09-20 MED ORDER — SERTRALINE HCL 50 MG PO TABS: 50.0000 mg | ORAL_TABLET | Freq: Every day | ORAL | Status: DC

## 2015-09-20 MED ORDER — INSULIN ASPART 100 UNIT/ML ~~LOC~~ SOLN
10.0000 [IU] | Freq: Once | SUBCUTANEOUS | Status: DC
  Filled 2015-09-20: qty 1

## 2015-09-20 MED ORDER — TRAZODONE HCL 50 MG PO TABS: 50.0000 mg | ORAL_TABLET | Freq: Every evening | ORAL | Status: DC | PRN

## 2015-09-20 MED ORDER — POTASSIUM CHLORIDE IN NACL 20-0.9 MEQ/L-% IV SOLN
INTRAVENOUS | Status: DC
  Filled 2015-09-20 (×2): qty 1000

## 2015-09-20 MED ORDER — ALBUTEROL SULFATE (2.5 MG/3ML) 0.083% IN NEBU: 2.5000 mg | INHALATION_SOLUTION | Freq: Four times a day (QID) | RESPIRATORY_TRACT | Status: DC | PRN

## 2015-09-20 MED ORDER — INSULIN DETEMIR 100 UNIT/ML ~~LOC~~ SOLN
15.0000 [IU] | Freq: Every day | SUBCUTANEOUS | Status: DC
  Administered 2015-09-20: 15 [IU] via SUBCUTANEOUS
  Filled 2015-09-20 (×2): qty 0.15

## 2015-09-20 MED ORDER — SIMVASTATIN 10 MG PO TABS: 10.0000 mg | ORAL_TABLET | Freq: Every day | ORAL | Status: DC

## 2015-09-20 MED ORDER — SODIUM CHLORIDE 0.9 % IV SOLN
INTRAVENOUS | Status: DC
  Administered 2015-09-20: 5.4 [IU]/h via INTRAVENOUS
  Filled 2015-09-20: qty 2.5

## 2015-09-20 MED ORDER — KCL IN DEXTROSE-NACL 20-5-0.45 MEQ/L-%-% IV SOLN
INTRAVENOUS | Status: DC
  Filled 2015-09-20: qty 1000

## 2015-09-20 NOTE — H&P (Signed)
Date: 09/20/2015               Patient Name:  Jonathan Austin MRN: [id number]  DOB: [birth year] Age / Sex: 50 y.o., male   PCP: Pcp Not In System         Medical Service: Internal Medicine Teaching Service         Attending Physician: Dr. Axel Filler, MD    First Contact: Dr. Ophelia Shoulder, MD Pager: [phone]  Second Contact: Dr. Dellia Nims, MD Pager: [phone]       After Hours (After 5p/  First Contact Pager: [phone]  weekends / holidays): Second Contact Pager: [phone]   Chief Complaint: hyperglycemia, medication noncompliance.   History of Present Illness:  Jonathan Austin is a very pleasant 50 y/o M with MHx significant for IDDM, HTN, HLD, depresssion and sleep apnea who presents for evaluation of hyperglycemia. The patient reports he has a 25 yr hx of diabetes with 5 years of insulin dependence. He states his blood sugar was >450 when he took it earlier today and states he hasn't taken insulin in 3-5 days because he ran out. He also reports he hasn't been able to fill his prescriptions at the pharmacy in 2 months because he couldn't afford it. Pt reports he hasn't been taking glipizide, Zoloft or his blood pressure medications for awhile either.  Per patient he has been experiencing increased thirst and increased urinary frequency for the past several days. He denies any fever, chills, abdominal pain, chest pain, nausea, vomiting, diarrhea, constipation, dysuria, cough or confusion. Patient also denies recent infection or prior history of DKA.   Meds:  Current Meds  Medication Sig  . albuterol (PROVENTIL HFA;VENTOLIN HFA) 108 (90 BASE) MCG/ACT inhaler Inhale 1-2 puffs into the lungs every 6 (six) hours as needed for wheezing or shortness of breath.  Marland Kitchen amLODipine (NORVASC) 5 MG tablet Take 1 tablet (5 mg total) by mouth daily.  Marland Kitchen glipiZIDE (GLUCOTROL) 10 MG tablet Take 1 tablet (10 mg total) by mouth daily before breakfast.  . hydrOXYzine (ATARAX/VISTARIL) 25 MG  tablet Take 1 tablet (25 mg total) by mouth every 6 (six) hours as needed for anxiety.  . insulin detemir (LEVEMIR) 100 UNIT/ML injection Inject 0.1-0.4 mLs (10-40 Units total) into the skin as directed. By your family medical provider  . insulin lispro (HUMALOG KWIKPEN) 100 UNIT/ML KiwkPen Inject 0.04-0.12 mLs (4-12 Units total) into the skin 2 (two) times daily before lunch and supper.  Marland Kitchen lisinopril (PRINIVIL,ZESTRIL) 10 MG tablet Take 1 tablet (10 mg total) by mouth daily.  Marland Kitchen loratadine (CLARITIN) 10 MG tablet Take 1 tablet (10 mg total) by mouth daily.  . metFORMIN (GLUCOPHAGE) 500 MG tablet Take 1 tablet (500 mg total) by mouth daily.  . sertraline (ZOLOFT) 50 MG tablet Take 1 tablet (50 mg total) by mouth daily. For depression  . simvastatin (ZOCOR) 10 MG tablet Take 1 tablet (10 mg total) by mouth daily at 6 PM. For Hyperlipidemia  . traZODone (DESYREL) 50 MG tablet Take 1 tablet (50 mg total) by mouth at bedtime as needed for sleep.   Allergies: Allergies as of 09/20/2015  . (No Known Allergies)   Past Medical History:  Diagnosis Date  . Depression   . Diabetes mellitus   . Sleep apnea     Family History:  Mother: DM Father: DM Endorses family history of DM in extended family members on both sides.   Social History:  Tobacco: never smoked Alcohol:  2-3 6 packs/week  Drugs: admits to cocaine and marijuana use  Review of Systems: A complete ROS was negative except as per HPI.   Physical Exam: Blood pressure 143/97, pulse 107, temperature 98.8 F (37.1 C), temperature source Oral, resp. rate 19, SpO2 98 %.   EKG:   CXR:  Assessment & Plan by Problem: Active Problems:   Diabetes mellitus type II, uncontrolled (HCC)   Hyperglycemia   HTN (hypertension)   HLD (hyperlipidemia)  1. Hyperosmolar Hyperglycemic State (HHS): Labs show most recent blood glucose 239, down from 900 in the ED. Serum osmolality elevated at 321 in ED. Urine without keytones or signs of  infection. No anion gap or acidosis. Bicarb normal at 26. ABG shows pH of 7.37. Corrected sodium 138. Potassium stable at 4.3. Pt received 3L ns + 24mEq potassium IV from ED. He was also given 10 units Novolog then continued on Novolin drip at 10units/hr. On the floor when patients glucose was <300, he was transitioned to D5W + 1/2 normal saline + 106mEq potassium @ 110mL/hr. This was d/c'd when pts blood sugar reached ~200. Patient was then given SQ insulin (15 units Levemir) and allowed to eat. Insulin drip was then continued for approximately 2 hours, which has been d/c'd as of 02:00 am.  Patient tolerated PO intake well and most recent glucose 239. Consult put in for care management as patient will need help with medications upon discharge.  Would suggest increasing Metformin on discharge as patient is only on 500mg  daily and denied any side effects.  2. Hyperlipidemia: Pt was on Simvastatin 10mg  at home however has been non-compliant with meds due to not being able to afford them. Pt started on high-intensity statin therapy. Atorvastatin 40mg    3. HTN: Stable at this time. Home Amlodipine 5mg  started. Will monitor.  4. Sleep Apnea: Patient reports nightly use of his prescribed CPAP. Ordered QHS.   Diet: NPO until sugar ~200 Code Status: Full code IVF: D5w + 1/2 normal saline + 74mEq potassium @125mL /hr DVT Prophylaxis: Lovenox  Dispo: Admit patient to Observation with expected length of stay less than 2 midnights.  SignedEinar Gip, DO 09/20/2015, 8:09 PM  Pager: [phone]

## 2015-09-20 NOTE — ED Provider Notes (Signed)
[facility] DEPT Provider Note   CSN: [id number] Arrival date & time: 09/20/15  1632  First Provider Contact:  None       History   Chief Complaint Chief Complaint  Patient presents with  . Hyperglycemia    HPI Jonathan Austin is a 50 y.o. male.  HPI  Patient with IDDM presents for hyperglycemia.  He ran out of insulin 2 days ago.  Now has excess thirst and urination.  Denies abdominal pain.  Denies alleviating / exacerbating factors.  Denies n/v, cough, SOB, dysuria.  Past Medical History:  Diagnosis Date  . Depression   . Diabetes mellitus   . Sleep apnea     Patient Active Problem List   Diagnosis Date Noted  . Substance abuse 08/30/2014  . Substance induced mood disorder ([place]) 05/07/2014  . Cocaine abuse with cocaine-induced mood disorder ([place])   . Alcohol abuse   . Suicidal ideations   . Acute respiratory failure ([place]) 02/12/2014  . Fever 02/12/2014  . Sinus tachycardia ([place]) 02/12/2014  . Cellulitis 06/28/2013  . Scrotal infection 06/28/2013  . Diabetes mellitus, type 2 ([place]) 06/28/2013  . Cocaine abuse 06/28/2013  . Marijuana abuse 06/28/2013  . Leukocytosis 06/28/2013  . Hyponatremia 06/28/2013  . CAP (community acquired pneumonia) 12/09/2012  . Controlled diabetes mellitus type II without complication ([place]) [id number]    Past Surgical History:  Procedure Laterality Date  . TONSILLECTOMY         Home Medications    Prior to Admission medications   Medication Sig Start Date End Date Taking? Authorizing Provider  albuterol (PROVENTIL HFA;VENTOLIN HFA) 108 (90 BASE) MCG/ACT inhaler Inhale 1-2 puffs into the lungs every 6 (six) hours as needed for wheezing or shortness of breath.   Yes Historical Provider, MD  amLODipine (NORVASC) 5 MG tablet Take 1 tablet (5 mg total) by mouth daily. 09/03/14  Yes Benjamine Mola, FNP  glipiZIDE (GLUCOTROL) 10 MG tablet Take 1 tablet (10 mg total) by mouth daily before breakfast. 09/03/14  Yes Benjamine Mola, FNP    hydrOXYzine (ATARAX/VISTARIL) 25 MG tablet Take 1 tablet (25 mg total) by mouth every 6 (six) hours as needed for anxiety. 05/08/14  Yes Shuvon B Rankin, NP  insulin detemir (LEVEMIR) 100 UNIT/ML injection Inject 0.1-0.4 mLs (10-40 Units total) into the skin as directed. By your family medical provider 09/03/14  Yes Benjamine Mola, FNP  insulin lispro (HUMALOG KWIKPEN) 100 UNIT/ML KiwkPen Inject 0.04-0.12 mLs (4-12 Units total) into the skin 2 (two) times daily before lunch and supper. [id number]  Yes Delora Fuel, MD  lisinopril (PRINIVIL,ZESTRIL) 10 MG tablet Take 1 tablet (10 mg total) by mouth daily. 09/03/14  Yes Benjamine Mola, FNP  loratadine (CLARITIN) 10 MG tablet Take 1 tablet (10 mg total) by mouth daily. 09/03/14  Yes Benjamine Mola, FNP  sertraline (ZOLOFT) 50 MG tablet Take 1 tablet (50 mg total) by mouth daily. For depression 09/03/14  Yes Benjamine Mola, FNP  simvastatin (ZOCOR) 10 MG tablet Take 1 tablet (10 mg total) by mouth daily at 6 PM. For Hyperlipidemia 09/03/14  Yes Benjamine Mola, FNP  traZODone (DESYREL) 50 MG tablet Take 1 tablet (50 mg total) by mouth at bedtime as needed for sleep. 09/03/14  Yes Benjamine Mola, FNP  metFORMIN (GLUCOPHAGE) 500 MG tablet Take 1 tablet (500 mg total) by mouth daily. 09/03/14   Benjamine Mola, FNP    Family History Family History  Problem Relation Age of Onset  .  Hypertension Mother   . Hypertension Father   . Diabetes Father     Social History Social History  Substance Use Topics  . Smoking status: Never Smoker  . Smokeless tobacco: Never Used  . Alcohol use 1.8 oz/week    1 Glasses of wine, 1 Cans of beer, 1 Shots of liquor per week     Comment: Occasionally     Allergies   Review of patient's allergies indicates no known allergies.   Review of Systems Review of Systems  Constitutional: Negative for chills and fever.  HENT: Negative for ear pain and sore throat.   Eyes: Negative for pain and visual disturbance.  Respiratory:  Negative for cough and shortness of breath.   Cardiovascular: Negative for chest pain and palpitations.  Gastrointestinal: Negative for abdominal pain and vomiting.  Endocrine: Positive for polydipsia and polyuria. Negative for polyphagia.  Genitourinary: Negative for dysuria and hematuria.  Musculoskeletal: Negative for arthralgias and back pain.  Skin: Negative for color change and rash.  Neurological: Negative for seizures and syncope.  All other systems reviewed and are negative.    Physical Exam Updated Vital Signs BP 141/88 (BP Location: Right Arm)   Pulse 110   Temp 98.8 F (37.1 C) (Oral)   Resp 18   SpO2 97%   Physical Exam  Constitutional: He appears well-developed and well-nourished.  HENT:  Head: Normocephalic and atraumatic.  Eyes: Conjunctivae are normal.  Neck: Neck supple.  Cardiovascular: Normal rate and regular rhythm.   No murmur heard. Pulmonary/Chest: Effort normal and breath sounds normal. No respiratory distress.  Abdominal: Soft. There is no tenderness.  Musculoskeletal: He exhibits no edema.  Neurological: He is alert.  Skin: Skin is warm and dry.  Psychiatric: He has a normal mood and affect.  Nursing note and vitals reviewed.    ED Treatments / Results  Labs (all labs ordered are listed, but only abnormal results are displayed) Labs Reviewed  URINALYSIS, ROUTINE W REFLEX MICROSCOPIC (NOT AT [facility]) - Abnormal; Notable for the following:       Result Value   Glucose, UA >1000 (*)    All other components within normal limits  URINE MICROSCOPIC-ADD ON - Abnormal; Notable for the following:    Squamous Epithelial / LPF 0-5 (*)    All other components within normal limits  CBG MONITORING, ED - Abnormal; Notable for the following:    Glucose-Capillary >600 (*)    All other components within normal limits  CBC  BASIC METABOLIC PANEL    EKG  EKG Interpretation None       Radiology No results found.  Procedures Procedures (including  critical care time)  Medications Ordered in ED Medications - No data to display   Initial Impression / Assessment and Plan / ED Course  I have reviewed the triage vital signs and the nursing notes.  Pertinent labs & imaging results that were available during my care of the patient were reviewed by me and considered in my medical decision making (see chart for details).  Clinical Course    Patient appears dehydrated, so fluids ordered.  Labs show marked BGL elevation.  No acidosis or anion gap.  No symptoms of underlying infection.  Suspect HHS in this patient.  Insulin ordered.  Patient updated with results, and will be admitted to hospitalist for further care.    Final Clinical Impressions(s) / ED Diagnoses   Final diagnoses:  None    New Prescriptions New Prescriptions   No medications on  file     Levada Schilling, MD 09/20/15 [place], MD 09/20/15 2009

## 2015-09-20 NOTE — ED Notes (Signed)
Reported critical glucose to Dr Augustin Coupe.

## 2015-09-20 NOTE — ED Triage Notes (Signed)
Pt sts out of meds x 1 week and having PU/PD

## 2015-09-21 DIAGNOSIS — Z794 Long term (current) use of insulin: Secondary | ICD-10-CM

## 2015-09-21 DIAGNOSIS — E1100 Type 2 diabetes mellitus with hyperosmolarity without nonketotic hyperglycemic-hyperosmolar coma (NKHHC): Secondary | ICD-10-CM

## 2015-09-21 LAB — BASIC METABOLIC PANEL
ANION GAP: 8 (ref 5–15)
Anion gap: 7 (ref 5–15)
Anion gap: 8 (ref 5–15)
Anion gap: 8 (ref 5–15)
BUN: 11 mg/dL (ref 6–20)
BUN: 10 mg/dL (ref 6–20)
BUN: 10 mg/dL (ref 6–20)
BUN: 12 mg/dL (ref 6–20)
CALCIUM: 8.8 mg/dL — AB (ref 8.9–10.3)
CALCIUM: 9 mg/dL (ref 8.9–10.3)
CALCIUM: 9.1 mg/dL (ref 8.9–10.3)
CHLORIDE: 100 mmol/L — AB (ref 101–111)
CO2: 26 mmol/L (ref 22–32)
CO2: 27 mmol/L (ref 22–32)
CO2: 27 mmol/L (ref 22–32)
CO2: 26 mmol/L (ref 22–32)
CREATININE: 1 mg/dL (ref 0.61–1.24)
CREATININE: 1.02 mg/dL (ref 0.61–1.24)
CREATININE: 1.07 mg/dL (ref 0.61–1.24)
CREATININE: 1.07 mg/dL (ref 0.61–1.24)
Calcium: 8.4 mg/dL — ABNORMAL LOW (ref 8.9–10.3)
Chloride: 100 mmol/L — ABNORMAL LOW (ref 101–111)
Chloride: 98 mmol/L — ABNORMAL LOW (ref 101–111)
Chloride: 100 mmol/L — ABNORMAL LOW (ref 101–111)
GFR calc Af Amer: >60 mL/min (ref >60)
GFR calc Af Amer: >60 mL/min (ref >60)
GFR calc non Af Amer: >60 mL/min (ref >60)
GFR calc non Af Amer: >60 mL/min (ref >60)
GLUCOSE: 368 mg/dL — AB (ref 65–99)
GLUCOSE: 203 mg/dL — AB (ref 65–99)
Glucose, Bld: 295 mg/dL — ABNORMAL HIGH (ref 65–99)
Glucose, Bld: 306 mg/dL — ABNORMAL HIGH (ref 65–99)
Potassium: 3.5 mmol/L (ref 3.5–5.1)
Potassium: 4 mmol/L (ref 3.5–5.1)
Potassium: 4.5 mmol/L (ref 3.5–5.1)
Potassium: 4.2 mmol/L (ref 3.5–5.1)
SODIUM: 133 mmol/L — AB (ref 135–145)
SODIUM: 134 mmol/L — AB (ref 135–145)
SODIUM: 135 mmol/L (ref 135–145)
Sodium: 133 mmol/L — ABNORMAL LOW (ref 135–145)

## 2015-09-21 LAB — GLUCOSE, CAPILLARY
GLUCOSE-CAPILLARY: 239 mg/dL — AB (ref 65–99)
GLUCOSE-CAPILLARY: 239 mg/dL — AB (ref 65–99)
GLUCOSE-CAPILLARY: 291 mg/dL — AB (ref 65–99)
GLUCOSE-CAPILLARY: 557 mg/dL — AB (ref 65–99)
Glucose-Capillary: 255 mg/dL — ABNORMAL HIGH (ref 65–99)
Glucose-Capillary: 306 mg/dL — ABNORMAL HIGH (ref 65–99)

## 2015-09-21 MED ORDER — INSULIN NPH ISOPHANE & REGULAR (70-30) 100 UNIT/ML ~~LOC~~ SUSP: 7.0000 [IU] | Freq: Two times a day (BID) | SUBCUTANEOUS | 11 refills | Status: DC

## 2015-09-21 MED ORDER — INSULIN ASPART 100 UNIT/ML ~~LOC~~ SOLN: 0.0000 [IU] | Freq: Every day | SUBCUTANEOUS | Status: DC

## 2015-09-21 MED ORDER — "INSULIN SYRINGE-NEEDLE U-100 31G X 15/64"" 1 ML MISC": 2 refills | Status: DC

## 2015-09-21 MED ORDER — POTASSIUM CHLORIDE CRYS ER 20 MEQ PO TBCR
40.0000 meq | EXTENDED_RELEASE_TABLET | Freq: Once | ORAL | Status: AC
  Administered 2015-09-21: 40 meq via ORAL
  Filled 2015-09-21: qty 2

## 2015-09-21 MED ORDER — INSULIN ASPART 100 UNIT/ML ~~LOC~~ SOLN
0.0000 [IU] | Freq: Three times a day (TID) | SUBCUTANEOUS | Status: DC
Start: 2015-09-21 — End: 2015-09-21
  Administered 2015-09-21 (×2): 8 [IU] via SUBCUTANEOUS

## 2015-09-21 NOTE — Care Management Note (Signed)
Case Management Note  Patient Details  Name: Jonathan Austin MRN: [id number] Date of Birth: 07-26-1965  Subjective/Objective:     CM following for progression and d/c planning.                Action/Plan: 09/21/2015 Met with pt who is unsure if he can afford his insulin at this time. [place] letter provided for this pt to receive 30 day supply of Insulin for $3.   Expected Discharge Date:    09/21/2015              Expected Discharge Plan:  Home/Self Care  In-House Referral:  NA  Discharge planning Services  CM Consult, [facility]  Post Acute Care Choice:  NA Choice offered to:  NA  DME Arranged:  N/A DME Agency:  NA  HH Arranged:  NA HH Agency:  NA  Status of Service:  Completed, signed off  If discussed at [place] of Stay Meetings, dates discussed:    Additional Comments:  Adron Bene, RN 09/21/2015, 2:21 PM

## 2015-09-21 NOTE — Progress Notes (Addendum)
   Subjective: No acute events overnight. Patient feels well this morning. He denies nausea, vomiting or abdominal pain. He is able to take good by mouth intake. He has no additional acute complaints or concerns this morning. He met and discussed with the pharmacist options to receive insulin. He is stable and ready for discharge.  Objective:  Vital signs in last 24 hours: Vitals:   09/20/15 1919 09/21/15 0126 09/21/15 0552 09/21/15 0937  BP: (!) 141/84  (!) 158/112 (!) 135/91  Pulse: (!) 106 99 90 94  Resp: _0 Temp: 97.6 F (36.4 C)  97.8 F (36.6 C) 98.2 F (36.8 C)  TempSrc: Oral  Oral Oral  SpO2: 98% 97% 98% 98%   Physical Exam  Constitutional: He is oriented to person, place, and time. He appears well-developed and well-nourished.  In no acute distress  HENT:  Head: Normocephalic and atraumatic.  Cardiovascular: Normal rate and regular rhythm.  Exam reveals no gallop and no friction rub.   No murmur heard. Respiratory: Effort normal and breath sounds normal. No respiratory distress. He has no wheezes.  GI: Soft. He exhibits no distension. There is no tenderness.  Musculoskeletal: He exhibits no edema.  Neurological: He is alert and oriented to person, place, and time.     Assessment/Plan: Mr. Fairhurst is a 50 year old male with a medical history of IDDM, HTN, HLD and sleep apnea who presents with hyperglycemia and labs consistent with HHS.   1. HHS, Resolved -- Patient presented to the emergency department with a glucose of 900 and a serum osmolality of 321. The patient received 3 L normal saline +60 mEq potassium and was treated with Novolin drip at 10 units per hour. The drip was discontinued overnight and the patient was transitioned to subcutaneous insulin. His most recent capillary glucose was 306. His hyperglycemia and HHS was secondary to his inability to obtain his insulin. -- Patient will be discharged on 70/30 and will take 7 units twice a day divided  evenly between a.m. and p.m. -- Follow-up with PCP for adjustments to his insulin regimen. -- Continue metformin -- Continue glipizide  2. Hyperlipidemia, Stable  -- Discuss with primary care physician regarding the use of atorvastatin as the patient may not currently be able to afford this medication and ensuring that he takes his insulin will be his top priority.  3. HTN, Stable -- Continue home amlodipine 5 mg  Dispo: Anticipated discharge in approximately Today.   Ophelia Shoulder, MD 09/21/2015, 11:05 AM Pager: [phone]

## 2015-09-21 NOTE — Progress Notes (Signed)
Briefly spoke with patient prior to d/c.  He states that he has been on 70/30 insulin before in the past.  He is familiar with use of vial and syringe and plans to follow-up at the [facility].  Advised him to get meter from [facility] for 9$ for monitoring.  Discussed importance of taking 70/30 with meal and calling his PCP for adjustment in medications as needed.   Patient verbalized understanding.   Thanks, Adah Perl, RN, BC-ADM Inpatient Diabetes Coordinator Pager [phone] (8a-5p)

## 2015-09-21 NOTE — Discharge Summary (Signed)
Name: Jonathan Austin MRN: [id number] DOB: 1965-08-13 50 y.o. PCP: Pcp Not In System  Date of Admission: 09/20/2015  5:19 PM Date of Discharge: 09/21/2015 Attending Physician: Axel Filler, MD  Discharge Diagnosis: HHS   Discharge Medications:   Medication List    STOP taking these medications   insulin detemir 100 UNIT/ML injection Commonly known as:  LEVEMIR   insulin lispro 100 UNIT/ML KiwkPen Commonly known as:  HUMALOG KWIKPEN     TAKE these medications   albuterol 108 (90 Base) MCG/ACT inhaler Commonly known as:  PROVENTIL HFA;VENTOLIN HFA Inhale 1-2 puffs into the lungs every 6 (six) hours as needed for wheezing or shortness of breath.   amLODipine 5 MG tablet Commonly known as:  NORVASC Take 1 tablet (5 mg total) by mouth daily.   glipiZIDE 10 MG tablet Commonly known as:  GLUCOTROL Take 1 tablet (10 mg total) by mouth daily before breakfast.   hydrOXYzine 25 MG tablet Commonly known as:  ATARAX/VISTARIL Take 1 tablet (25 mg total) by mouth every 6 (six) hours as needed for anxiety.   insulin NPH-regular Human (70-30) 100 UNIT/ML injection Commonly known as:  NOVOLIN 70/30 Inject 7 Units into the skin 2 (two) times daily with a meal.   Insulin Syringe-Needle U-100 31G X 15/64" 1 ML Misc To inject insulin daily   lisinopril 10 MG tablet Commonly known as:  PRINIVIL,ZESTRIL Take 1 tablet (10 mg total) by mouth daily.   loratadine 10 MG tablet Commonly known as:  CLARITIN Take 1 tablet (10 mg total) by mouth daily.   metFORMIN 500 MG tablet Commonly known as:  GLUCOPHAGE Take 1 tablet (500 mg total) by mouth daily.   sertraline 50 MG tablet Commonly known as:  ZOLOFT Take 1 tablet (50 mg total) by mouth daily. For depression   simvastatin 10 MG tablet Commonly known as:  ZOCOR Take 1 tablet (10 mg total) by mouth daily at 6 PM. For Hyperlipidemia   traZODone 50 MG tablet Commonly known as:  DESYREL Take 1 tablet (50 mg total) by mouth  at bedtime as needed for sleep.       Disposition and follow-up:   [name] was discharged from [facility] in Good condition.  At the hospital follow up visit please address:  1.  Please ensure the patient is able to get his insulin. Additionally, make adjustments to the patient's insulin regimen as necessary.  2.  Labs / imaging needed at time of follow-up: None  3.  Pending labs/ test needing follow-up: None  Follow-up Appointments:  The patient stated he will make an appointment today to follow up with his primary care physician as soon as possible.  Hospital Course by problem list:   1. HHS Patient presented to the [facility] emergency department on 09/20/2015 with polydipsia and polyuria. Patient stated that he had been out of his insulin as he recently switched employers and did not have insurance. At time of arrival in the emergency department the patient's glucose was 900 with a serum osmolality of 321. The patient had a VBG which showed a pH of 7.37 and had a corrected sodium of 138. He received  3 L of normal saline as well as 60 mEq potassium in the emergency department. Additionally, he was given 10 units of NovoLog and started on Novolin drip at 10 units per hour. The  patient was then admitted to the floor (his glucose was less than 300 at this time). He was  transitioned to D5 half-normal saline +20 mEq potassium. The patient's blood sugar reached 200 and the drip was discontinued. The patient was switched to subcutaneous insulin at that time. The patient symptomatically improved and was not complaining of changes in vision, nausea, vomiting or abdominal pain. He was able to tolerate by mouth intake this morning and his glucose had returned to 255. He'll be discharged on a new insulin regimen which should be easier for the patient to adhere to. He will take insulin 70/30 7 units twice a day (14 total units). He will continue his metformin and glipizide. The  patient was at the hospital for less than 24 hours in total. At time of discharge the patient was feeling well, with good by mouth intake, afebrile and hemodynamically stable.   Discharge Vitals:   BP (!) 135/91 (BP Location: Right Arm)   Pulse 94   Temp 98.2 F (36.8 C) (Oral)   Resp 16   SpO2 98%   Pertinent Labs, Studies, and Procedures:  None  Discharge Instructions: Discharge Instructions    Discharge instructions    Complete by:  As directed   Today we will start you on a new insulin regimen. You will only need to take this insulin twice daily. Please take 7 units in the morning and 7 units in the evening. Continue to check you blood sugars at home and if they are low withhold a dose. Also continue to take glipizide as prescribed and metformin.  Additionally, please make an appointment with your primary care physician for a hospital follow-up visit. At this appointment please discuss with your primary care physician your current insulin regimen and see if he/she wants to make adjustments to further manage your diabetes. Additionally, at this visit please discuss with your primary care physician about taking atorvastatin for hyperlipidemia if this medication is affordable.      Signed: Ophelia Shoulder, MD 09/21/2015, 11:32 AM   Pager: [phone]

## 2015-09-21 NOTE — Discharge Instructions (Signed)
Please take your insulin regimen as prescribed. To recap, take 7 units twice daily. Continued to monitor your glucose and if your blood sugar is low skip a dose. Additionally, continue to take glipizide and metformin as previously prescribed.  Please make sure to schedule an appointment with your primary care physician for hospital follow-up. At this appointment discussed with him/her your current insulin regimen and discuss starting atorvastatin for hyperlipidemia if the medication is affordable.

## 2015-09-21 NOTE — Progress Notes (Signed)
Discharge instructions given. Pt verbalized understanding and all questions were given.

## 2015-09-22 LAB — HEMOGLOBIN A1C
Hgb A1c MFr Bld: 13.1 % — ABNORMAL HIGH (ref 4.8–5.6)
Mean Plasma Glucose: 329 mg/dL

## 2015-12-10 ENCOUNTER — Emergency Department (HOSPITAL_COMMUNITY)
Admission: EM | Admit: 2015-12-10 | Discharge: 2015-12-10 | Disposition: A | Discharge status: Home / Self Care | Payer: 59 | Attending: Emergency Medicine | Admitting: Emergency Medicine

## 2015-12-10 ENCOUNTER — Encounter (HOSPITAL_COMMUNITY): Payer: Self-pay | Admitting: Emergency Medicine

## 2015-12-10 DIAGNOSIS — I10 Essential (primary) hypertension: Secondary | ICD-10-CM | POA: Insufficient documentation

## 2015-12-10 DIAGNOSIS — E1165 Type 2 diabetes mellitus with hyperglycemia: Secondary | ICD-10-CM | POA: Insufficient documentation

## 2015-12-10 DIAGNOSIS — Z794 Long term (current) use of insulin: Secondary | ICD-10-CM | POA: Insufficient documentation

## 2015-12-10 DIAGNOSIS — R739 Hyperglycemia, unspecified: Secondary | ICD-10-CM

## 2015-12-10 LAB — I-STAT VENOUS BLOOD GAS, ED
BICARBONATE: 27.6 mmol/L (ref 20.0–28.0)
O2 Saturation: 62 %
PH VEN: 7.326 (ref 7.250–7.430)
PO2 VEN: 35 mmHg (ref 32.0–45.0)
TCO2: 29 mmol/L (ref 0–100)
pCO2, Ven: 52.8 mmHg (ref 44.0–60.0)

## 2015-12-10 LAB — URINE MICROSCOPIC-ADD ON: BACTERIA UA: NONE SEEN

## 2015-12-10 LAB — BASIC METABOLIC PANEL
ANION GAP: 10 (ref 5–15)
BUN: 17 mg/dL (ref 6–20)
CALCIUM: 8.8 mg/dL — AB (ref 8.9–10.3)
CO2: 25 mmol/L (ref 22–32)
Chloride: 90 mmol/L — ABNORMAL LOW (ref 101–111)
Creatinine, Ser: 1.41 mg/dL — ABNORMAL HIGH (ref 0.61–1.24)
GFR, EST NON AFRICAN AMERICAN: 57 mL/min — AB (ref >60)
Glucose, Bld: 751 mg/dL (ref 65–99)
Potassium: 5.1 mmol/L (ref 3.5–5.1)
Sodium: 125 mmol/L — ABNORMAL LOW (ref 135–145)

## 2015-12-10 LAB — URINALYSIS, ROUTINE W REFLEX MICROSCOPIC
Bilirubin Urine: NEGATIVE
Glucose, UA: >1000 mg/dL — AB
Hgb urine dipstick: NEGATIVE
Ketones, ur: NEGATIVE mg/dL
Leukocytes, UA: NEGATIVE
NITRITE: NEGATIVE
Protein, ur: NEGATIVE mg/dL
SPECIFIC GRAVITY, URINE: 1.025 (ref 1.005–1.030)
pH: 6 (ref 5.0–8.0)

## 2015-12-10 LAB — CBC
HCT: 44.8 % (ref 39.0–52.0)
HEMOGLOBIN: 15 g/dL (ref 13.0–17.0)
MCH: 29.9 pg (ref 26.0–34.0)
MCHC: 33.5 g/dL (ref 30.0–36.0)
MCV: 89.2 fL (ref 78.0–100.0)
Platelets: 334 10*3/uL (ref 150–400)
RBC: 5.02 MIL/uL (ref 4.22–5.81)
RDW: 12.7 % (ref 11.5–15.5)
WBC: 4.5 10*3/uL (ref 4.0–10.5)

## 2015-12-10 LAB — CBG MONITORING, ED
GLUCOSE-CAPILLARY: 153 mg/dL — AB (ref 65–99)
Glucose-Capillary: 596 mg/dL (ref 65–99)
Glucose-Capillary: 524 mg/dL (ref 65–99)
Glucose-Capillary: 265 mg/dL — ABNORMAL HIGH (ref 65–99)

## 2015-12-10 MED ORDER — SODIUM CHLORIDE 0.9 % IV SOLN
INTRAVENOUS | Status: DC
  Administered 2015-12-10: 4.6 [IU]/h via INTRAVENOUS
  Filled 2015-12-10: qty 2.5

## 2015-12-10 MED ORDER — SODIUM CHLORIDE 0.9 % IV BOLUS (SEPSIS)
1000.0000 mL | Freq: Once | INTRAVENOUS | Status: AC
  Administered 2015-12-10: 1000 mL via INTRAVENOUS

## 2015-12-10 MED ORDER — SODIUM CHLORIDE 0.9 % IV SOLN
INTRAVENOUS | Status: DC
  Administered 2015-12-10: 17:00:00 via INTRAVENOUS

## 2015-12-10 MED ORDER — DEXTROSE-NACL 5-0.45 % IV SOLN: INTRAVENOUS | Status: DC

## 2015-12-10 NOTE — ED Notes (Addendum)
Insulin gtt stopped per [facility]

## 2015-12-10 NOTE — ED Notes (Signed)
Lab called with results of glucose at 751. Pearlie Oyster, PA notified.

## 2015-12-10 NOTE — ED Triage Notes (Signed)
Pt states his meter wouldn't read sugar today; been lethargic. Admits to eating lots of "things I shouldn't" but has been taking 70/30 insulin. Nauseated.

## 2015-12-10 NOTE — Discharge Instructions (Signed)
Please follow-up with your primary care provider for discussion of today's diagnosis. Please keep track of your blood sugars in a journal as we discussed to bring to your follow up appointment. Please let them know at this ER visit, your blood sugar was 751. There were no ketones in your urine and your anion gap was normal. We did a venous blood gas which showed a normal pH. You were treated with fluids and insulin in the ER and your blood sugars greatly improved.   Please return to the ER for new or worsening symptoms, any additional concerns.

## 2015-12-10 NOTE — ED Provider Notes (Signed)
[facility] DEPT Provider Note   CSN: [id number] Arrival date & time: 12/10/15  1435     History   Chief Complaint Chief Complaint  Patient presents with  . Hyperglycemia    HPI Arthor Stelma is a 50 y.o. male.  The history is provided by the patient and medical records. No language interpreter was used.  Hyperglycemia  Associated symptoms: fatigue and nausea   Associated symptoms: no abdominal pain, no dysuria, no fever, no shortness of breath and no vomiting    Raiden Hammann is a 50 y.o. male  with a PMH of DM who presents to the Emergency Department concerned for blood sugar reading "high" on glucometer this morning. Patient states he was feeling slightly nauseous and little more tired than usual beginning this morning as well. He states his blood sugars typically run in the 200s-250's. On 12 units Novolin 70/30 twice a day and has been compliant with this medication regimen. He states that he has been eating a lot more foods that he knows he shouldn't be eating though. He denies fever, abdominal pain, vomiting, back pain, chest pain, shortness of breath. It cc fluid given in triage prior to my evaluation and patient states nausea has improved. He is now asymptomatic with no complaints.  Past Medical History:  Diagnosis Date  . Depression   . Diabetes mellitus   . Sleep apnea     Patient Active Problem List   Diagnosis Date Noted  . Hyperglycemia 09/20/2015  . HTN (hypertension) 09/20/2015  . HLD (hyperlipidemia) 09/20/2015  . Type 2 diabetes mellitus with hyperosmolar nonketotic hyperglycemia ([place]) 09/20/2015  . Substance abuse 08/30/2014  . Substance induced mood disorder ([place]) 05/07/2014  . Cocaine abuse with cocaine-induced mood disorder ([place])   . Alcohol abuse   . Suicidal ideations   . Fever 02/12/2014  . Sinus tachycardia 02/12/2014  . Cellulitis 06/28/2013  . Scrotal infection 06/28/2013  . Cocaine abuse 06/28/2013  . Marijuana abuse 06/28/2013  .  Leukocytosis 06/28/2013  . Hyponatremia 06/28/2013  . Diabetes mellitus type II, uncontrolled ([place]) 12/09/2012    Past Surgical History:  Procedure Laterality Date  . TONSILLECTOMY         Home Medications    Prior to Admission medications   Medication Sig Start Date End Date Taking? Authorizing Provider  Carboxymethylcellul-Glycerin (CLEAR EYES FOR DRY EYES OP) Apply 1-2 drops to eye 3 (three) times daily as needed (for dryness).   Yes Historical Provider, MD  glipiZIDE (GLUCOTROL) 10 MG tablet Take 1 tablet (10 mg total) by mouth daily before breakfast. 09/03/14  Yes Benjamine Mola, FNP  ibuprofen (ADVIL,MOTRIN) 200 MG tablet Take 200-400 mg by mouth every 6 (six) hours as needed (for pain).   Yes Historical Provider, MD  insulin NPH-regular Human (NOVOLIN 70/30) (70-30) 100 UNIT/ML injection Inject 7 Units into the skin 2 (two) times daily with a meal. Patient taking differently: Inject 12 Units into the skin 2 (two) times daily with a meal.  09/21/15  Yes Ophelia Shoulder, MD  Insulin Syringe-Needle U-100 31G X 15/64" 1 ML MISC To inject insulin daily 09/21/15  Yes Ophelia Shoulder, MD  metFORMIN (GLUCOPHAGE) 500 MG tablet Take 1 tablet (500 mg total) by mouth daily. 09/03/14  Yes Benjamine Mola, FNP  amLODipine (NORVASC) 5 MG tablet Take 1 tablet (5 mg total) by mouth daily. Patient not taking: Reported on 12/10/2015 09/03/14   Benjamine Mola, FNP  hydrOXYzine (ATARAX/VISTARIL) 25 MG tablet Take 1 tablet (25 mg total)  by mouth every 6 (six) hours as needed for anxiety. Patient not taking: Reported on 12/10/2015 05/08/14   Shuvon B Rankin, NP  lisinopril (PRINIVIL,ZESTRIL) 10 MG tablet Take 1 tablet (10 mg total) by mouth daily. Patient not taking: Reported on 12/10/2015 09/03/14   Benjamine Mola, FNP  loratadine (CLARITIN) 10 MG tablet Take 1 tablet (10 mg total) by mouth daily. Patient not taking: Reported on 12/10/2015 09/03/14   Benjamine Mola, FNP  sertraline (ZOLOFT) 50 MG tablet Take 1 tablet  (50 mg total) by mouth daily. For depression Patient not taking: Reported on 12/10/2015 09/03/14   Benjamine Mola, FNP  simvastatin (ZOCOR) 10 MG tablet Take 1 tablet (10 mg total) by mouth daily at 6 PM. For Hyperlipidemia Patient not taking: Reported on 12/10/2015 09/03/14   Benjamine Mola, FNP  traZODone (DESYREL) 50 MG tablet Take 1 tablet (50 mg total) by mouth at bedtime as needed for sleep. Patient not taking: Reported on 12/10/2015 09/03/14   Benjamine Mola, FNP    Family History Family History  Problem Relation Age of Onset  . Hypertension Mother   . Hypertension Father   . Diabetes Father     Social History Social History  Substance Use Topics  . Smoking status: Never Smoker  . Smokeless tobacco: Never Used  . Alcohol use 1.8 oz/week    1 Glasses of wine, 1 Cans of beer, 1 Shots of liquor per week     Comment: Occasionally     Allergies   Review of patient's allergies indicates no known allergies.   Review of Systems Review of Systems  Constitutional: Positive for fatigue. Negative for chills and fever.  HENT: Negative for congestion.   Eyes: Negative for visual disturbance.  Respiratory: Negative for cough and shortness of breath.   Cardiovascular: Negative.   Gastrointestinal: Positive for nausea. Negative for abdominal pain and vomiting.  Genitourinary: Negative for dysuria.  Musculoskeletal: Negative for back pain and neck pain.  Skin: Negative for rash.  Neurological: Negative for headaches.     Physical Exam Updated Vital Signs BP 137/89   Pulse 92   Temp 98 F (36.7 C) (Oral)   Resp 16   Ht 5\' 7"  (1.702 m)   Wt 104.3 kg   SpO2 98%   BMI 36.02 kg/m   Physical Exam  Constitutional: He is oriented to person, place, and time. He appears well-developed and well-nourished. No distress.  HENT:  Head: Normocephalic and atraumatic.  Cardiovascular: Normal rate, regular rhythm and normal heart sounds.   No murmur heard. Pulmonary/Chest: Effort normal  and breath sounds normal. No respiratory distress. He has no wheezes. He has no rales.  Abdominal: Soft. Bowel sounds are normal. He exhibits no distension. There is no tenderness.  Musculoskeletal: He exhibits no edema.  Neurological: He is alert and oriented to person, place, and time.  Skin: Skin is warm and dry.  Nursing note and vitals reviewed.    ED Treatments / Results  Labs (all labs ordered are listed, but only abnormal results are displayed) Labs Reviewed  BASIC METABOLIC PANEL - Abnormal; Notable for the following:       Result Value   Sodium 125 (*)    Chloride 90 (*)    Glucose, Bld 751 (*)    Creatinine, Ser 1.41 (*)    Calcium 8.8 (*)    GFR calc non Af Amer 57 (*)    All other components within normal limits  URINALYSIS, ROUTINE W  REFLEX MICROSCOPIC (NOT AT [facility]) - Abnormal; Notable for the following:    Glucose, UA >1000 (*)    All other components within normal limits  URINE MICROSCOPIC-ADD ON - Abnormal; Notable for the following:    Squamous Epithelial / LPF 0-5 (*)    All other components within normal limits  CBG MONITORING, ED - Abnormal; Notable for the following:    Glucose-Capillary >600 (*)    All other components within normal limits  CBG MONITORING, ED - Abnormal; Notable for the following:    Glucose-Capillary 596 (*)    All other components within normal limits  CBG MONITORING, ED - Abnormal; Notable for the following:    Glucose-Capillary 524 (*)    All other components within normal limits  CBG MONITORING, ED - Abnormal; Notable for the following:    Glucose-Capillary 265 (*)    All other components within normal limits  CBG MONITORING, ED - Abnormal; Notable for the following:    Glucose-Capillary 153 (*)    All other components within normal limits  CBC  HEPATIC FUNCTION PANEL  BLOOD GAS, VENOUS  I-STAT VENOUS BLOOD GAS, ED    EKG  EKG Interpretation None       Radiology No results found.  Procedures Procedures (including  critical care time)  Medications Ordered in ED Medications  dextrose 5 %-0.45 % sodium chloride infusion (not administered)  sodium chloride 0.9 % bolus 1,000 mL (0 mLs Intravenous Stopped 12/10/15 1857)    And  0.9 %  sodium chloride infusion ( Intravenous Stopped 12/10/15 1843)  sodium chloride 0.9 % bolus 1,000 mL (0 mLs Intravenous Stopped 12/10/15 1619)  sodium chloride 0.9 % bolus 1,000 mL (0 mLs Intravenous Stopped 12/10/15 1649)     Initial Impression / Assessment and Plan / ED Course  I have reviewed the triage vital signs and the nursing notes.  Pertinent labs & imaging results that were available during my care of the patient were reviewed by me and considered in my medical decision making (see chart for details).  Clinical Course   Dana Bestor is a 50 y.o. male who presents to ED for hyperglycemia. On exam, patient is afebrile, nontoxic appearing with reassuring vital signs. He has no abdominal tenderness and is mentating appropriately. BMP with glucose of 751-no ketones in urine and anion gap of 10. Does have sodium of 125 and chloride of 90 as well. VBG with normal pH. No clinical signs of DKA. Will treat with fluids and insulin and continue to monitor in ED. Patient states that he would not like to be admitted if at all possible.   Patient reevaluated, sugars improving. Patient with no complaints at this time.  Patient reevaluated, last CBG 265. Again, asymptomatic at this time. PCP follow-up strongly encouraged. Discussed dietary modifications and journal of blood sugars to bring to PCP follow-up appointment. Reasons to return to ER were discussed. Patient expresses understanding and agreement with plan. Safe for discharge.  Final Clinical Impressions(s) / ED Diagnoses   Final diagnoses:  Hyperglycemia    New Prescriptions New Prescriptions   No medications on file     [place], PA-C [id number]    Delora Fuel, MD [id number]

## 2016-01-18 ENCOUNTER — Encounter (HOSPITAL_COMMUNITY): Payer: Self-pay | Admitting: Emergency Medicine

## 2016-01-18 ENCOUNTER — Emergency Department (HOSPITAL_COMMUNITY): Payer: Self-pay

## 2016-01-18 ENCOUNTER — Inpatient Hospital Stay (HOSPITAL_COMMUNITY)
Admission: EM | Admit: 2016-01-18 | Discharge: 2016-01-20 | DRG: 871 | Disposition: A | Discharge status: Home / Self Care | Payer: 59 | Attending: Internal Medicine | Admitting: Internal Medicine

## 2016-01-18 DIAGNOSIS — E875 Hyperkalemia: Secondary | ICD-10-CM | POA: Diagnosis present

## 2016-01-18 DIAGNOSIS — E871 Hypo-osmolality and hyponatremia: Secondary | ICD-10-CM | POA: Diagnosis present

## 2016-01-18 DIAGNOSIS — J189 Pneumonia, unspecified organism: Secondary | ICD-10-CM | POA: Diagnosis present

## 2016-01-18 DIAGNOSIS — N183 Chronic kidney disease, stage 3 (moderate): Secondary | ICD-10-CM

## 2016-01-18 DIAGNOSIS — R509 Fever, unspecified: Secondary | ICD-10-CM | POA: Diagnosis present

## 2016-01-18 DIAGNOSIS — E1121 Type 2 diabetes mellitus with diabetic nephropathy: Secondary | ICD-10-CM | POA: Diagnosis present

## 2016-01-18 DIAGNOSIS — N289 Disorder of kidney and ureter, unspecified: Secondary | ICD-10-CM | POA: Diagnosis present

## 2016-01-18 DIAGNOSIS — E860 Dehydration: Secondary | ICD-10-CM

## 2016-01-18 DIAGNOSIS — F10239 Alcohol dependence with withdrawal, unspecified: Secondary | ICD-10-CM | POA: Diagnosis present

## 2016-01-18 DIAGNOSIS — I10 Essential (primary) hypertension: Secondary | ICD-10-CM | POA: Diagnosis present

## 2016-01-18 DIAGNOSIS — Z794 Long term (current) use of insulin: Secondary | ICD-10-CM

## 2016-01-18 DIAGNOSIS — J101 Influenza due to other identified influenza virus with other respiratory manifestations: Secondary | ICD-10-CM | POA: Diagnosis present

## 2016-01-18 DIAGNOSIS — E1142 Type 2 diabetes mellitus with diabetic polyneuropathy: Secondary | ICD-10-CM | POA: Diagnosis present

## 2016-01-18 DIAGNOSIS — R000 Tachycardia, unspecified: Secondary | ICD-10-CM | POA: Diagnosis present

## 2016-01-18 DIAGNOSIS — E1165 Type 2 diabetes mellitus with hyperglycemia: Secondary | ICD-10-CM | POA: Diagnosis present

## 2016-01-18 DIAGNOSIS — J1001 Influenza due to other identified influenza virus with the same other identified influenza virus pneumonia: Secondary | ICD-10-CM | POA: Diagnosis present

## 2016-01-18 DIAGNOSIS — T383X6A Underdosing of insulin and oral hypoglycemic [antidiabetic] drugs, initial encounter: Secondary | ICD-10-CM | POA: Diagnosis present

## 2016-01-18 DIAGNOSIS — E1122 Type 2 diabetes mellitus with diabetic chronic kidney disease: Secondary | ICD-10-CM

## 2016-01-18 DIAGNOSIS — G4730 Sleep apnea, unspecified: Secondary | ICD-10-CM | POA: Diagnosis present

## 2016-01-18 DIAGNOSIS — IMO0002 Reserved for concepts with insufficient information to code with codable children: Secondary | ICD-10-CM

## 2016-01-18 DIAGNOSIS — N179 Acute kidney failure, unspecified: Secondary | ICD-10-CM | POA: Diagnosis present

## 2016-01-18 DIAGNOSIS — Y92009 Unspecified place in unspecified non-institutional (private) residence as the place of occurrence of the external cause: Secondary | ICD-10-CM

## 2016-01-18 DIAGNOSIS — N182 Chronic kidney disease, stage 2 (mild): Secondary | ICD-10-CM | POA: Diagnosis present

## 2016-01-18 DIAGNOSIS — Z91120 Patient's intentional underdosing of medication regimen due to financial hardship: Secondary | ICD-10-CM

## 2016-01-18 DIAGNOSIS — I129 Hypertensive chronic kidney disease with stage 1 through stage 4 chronic kidney disease, or unspecified chronic kidney disease: Secondary | ICD-10-CM | POA: Diagnosis present

## 2016-01-18 DIAGNOSIS — I959 Hypotension, unspecified: Secondary | ICD-10-CM | POA: Diagnosis not present

## 2016-01-18 DIAGNOSIS — F1910 Other psychoactive substance abuse, uncomplicated: Secondary | ICD-10-CM | POA: Diagnosis present

## 2016-01-18 DIAGNOSIS — R739 Hyperglycemia, unspecified: Secondary | ICD-10-CM

## 2016-01-18 DIAGNOSIS — Z8249 Family history of ischemic heart disease and other diseases of the circulatory system: Secondary | ICD-10-CM

## 2016-01-18 DIAGNOSIS — Z833 Family history of diabetes mellitus: Secondary | ICD-10-CM

## 2016-01-18 DIAGNOSIS — A419 Sepsis, unspecified organism: Secondary | ICD-10-CM

## 2016-01-18 DIAGNOSIS — R6520 Severe sepsis without septic shock: Secondary | ICD-10-CM | POA: Diagnosis present

## 2016-01-18 HISTORY — DX: Suicidal ideations: R45.851

## 2016-01-18 HISTORY — DX: Cellulitis, unspecified: L03.90

## 2016-01-18 HISTORY — DX: Inflammatory disorders of scrotum: N49.2

## 2016-01-18 HISTORY — DX: Cocaine abuse with cocaine-induced mood disorder: F14.14

## 2016-01-18 HISTORY — DX: Cocaine abuse, uncomplicated: F14.10

## 2016-01-18 LAB — CBC
HEMATOCRIT: 47.2 % (ref 39.0–52.0)
Hemoglobin: 16.2 g/dL (ref 13.0–17.0)
MCH: 30.4 pg (ref 26.0–34.0)
MCHC: 34.3 g/dL (ref 30.0–36.0)
MCV: 88.6 fL (ref 78.0–100.0)
PLATELETS: 229 10*3/uL (ref 150–400)
RBC: 5.33 MIL/uL (ref 4.22–5.81)
RDW: 13.1 % (ref 11.5–15.5)
WBC: 7 10*3/uL (ref 4.0–10.5)

## 2016-01-18 LAB — COMPREHENSIVE METABOLIC PANEL
ALK PHOS: 48 U/L (ref 38–126)
ALT: 21 U/L (ref 17–63)
AST: 27 U/L (ref 15–41)
Albumin: 3.4 g/dL — ABNORMAL LOW (ref 3.5–5.0)
Anion gap: 10 (ref 5–15)
BILIRUBIN TOTAL: 0.7 mg/dL (ref 0.3–1.2)
BUN: 14 mg/dL (ref 6–20)
CO2: 28 mmol/L (ref 22–32)
CREATININE: 1.67 mg/dL — AB (ref 0.61–1.24)
Calcium: 9.3 mg/dL (ref 8.9–10.3)
Chloride: 91 mmol/L — ABNORMAL LOW (ref 101–111)
GFR calc Af Amer: 54 mL/min — ABNORMAL LOW (ref >60)
GFR, EST NON AFRICAN AMERICAN: 46 mL/min — AB (ref >60)
Glucose, Bld: 414 mg/dL — ABNORMAL HIGH (ref 65–99)
Potassium: 5.5 mmol/L — ABNORMAL HIGH (ref 3.5–5.1)
Sodium: 129 mmol/L — ABNORMAL LOW (ref 135–145)
TOTAL PROTEIN: 6.8 g/dL (ref 6.5–8.1)

## 2016-01-18 LAB — CBC WITH DIFFERENTIAL/PLATELET
BASOS ABS: 0 10*3/uL (ref 0.0–0.1)
Basophils Relative: 0 %
EOS ABS: 0 10*3/uL (ref 0.0–0.7)
EOS PCT: 0 %
HCT: 46.5 % (ref 39.0–52.0)
Hemoglobin: 16 g/dL (ref 13.0–17.0)
Lymphocytes Relative: 18 %
Lymphs Abs: 1.8 10*3/uL (ref 0.7–4.0)
MCH: 30.3 pg (ref 26.0–34.0)
MCHC: 34.4 g/dL (ref 30.0–36.0)
MCV: 88.1 fL (ref 78.0–100.0)
Monocytes Absolute: 1.2 10*3/uL — ABNORMAL HIGH (ref 0.1–1.0)
Monocytes Relative: 12 %
Neutro Abs: 7 10*3/uL (ref 1.7–7.7)
Neutrophils Relative %: 70 %
PLATELETS: 253 10*3/uL (ref 150–400)
RBC: 5.28 MIL/uL (ref 4.22–5.81)
RDW: 13 % (ref 11.5–15.5)
WBC: 9.9 10*3/uL (ref 4.0–10.5)

## 2016-01-18 LAB — BASIC METABOLIC PANEL
ANION GAP: 10 (ref 5–15)
BUN: 14 mg/dL (ref 6–20)
CO2: 27 mmol/L (ref 22–32)
Calcium: 9.2 mg/dL (ref 8.9–10.3)
Chloride: 98 mmol/L — ABNORMAL LOW (ref 101–111)
Creatinine, Ser: 1.5 mg/dL — ABNORMAL HIGH (ref 0.61–1.24)
GFR, EST NON AFRICAN AMERICAN: 53 mL/min — AB (ref >60)
GLUCOSE: 188 mg/dL — AB (ref 65–99)
POTASSIUM: 4.1 mmol/L (ref 3.5–5.1)
Sodium: 135 mmol/L (ref 135–145)

## 2016-01-18 LAB — URINALYSIS, ROUTINE W REFLEX MICROSCOPIC
Bacteria, UA: NONE SEEN
Bilirubin Urine: NEGATIVE
HGB URINE DIPSTICK: NEGATIVE
Ketones, ur: 5 mg/dL — AB
LEUKOCYTES UA: NEGATIVE
Nitrite: NEGATIVE
PH: 6 (ref 5.0–8.0)
PROTEIN: 100 mg/dL — AB
Specific Gravity, Urine: 1.023 (ref 1.005–1.030)

## 2016-01-18 LAB — RAPID URINE DRUG SCREEN, HOSP PERFORMED
Amphetamines: NOT DETECTED
Barbiturates: NOT DETECTED
Benzodiazepines: NOT DETECTED
COCAINE: POSITIVE — AB
OPIATES: NOT DETECTED
TETRAHYDROCANNABINOL: NOT DETECTED

## 2016-01-18 LAB — CBG MONITORING, ED
Glucose-Capillary: 402 mg/dL — ABNORMAL HIGH (ref 65–99)
Glucose-Capillary: 348 mg/dL — ABNORMAL HIGH (ref 65–99)
Glucose-Capillary: 292 mg/dL — ABNORMAL HIGH (ref 65–99)

## 2016-01-18 LAB — ETHANOL

## 2016-01-18 LAB — GLUCOSE, CAPILLARY: GLUCOSE-CAPILLARY: 273 mg/dL — AB (ref 65–99)

## 2016-01-18 LAB — INFLUENZA PANEL BY PCR (TYPE A & B)
INFLAPCR: POSITIVE — AB
INFLBPCR: NEGATIVE

## 2016-01-18 MED ORDER — LEVOFLOXACIN IN D5W 500 MG/100ML IV SOLN
500.0000 mg | Freq: Once | INTRAVENOUS | Status: AC
  Administered 2016-01-18: 500 mg via INTRAVENOUS
  Filled 2016-01-18 (×2): qty 100

## 2016-01-18 MED ORDER — SODIUM CHLORIDE 0.9 % IV SOLN
INTRAVENOUS | Status: DC
  Administered 2016-01-18 – 2016-01-20 (×3): via INTRAVENOUS

## 2016-01-18 MED ORDER — ACETAMINOPHEN 325 MG PO TABS
650.0000 mg | ORAL_TABLET | ORAL | Status: AC
  Administered 2016-01-18: 650 mg via ORAL
  Filled 2016-01-18: qty 2

## 2016-01-18 MED ORDER — KETOROLAC TROMETHAMINE 30 MG/ML IJ SOLN
30.0000 mg | Freq: Once | INTRAMUSCULAR | Status: AC
  Administered 2016-01-18: 30 mg via INTRAVENOUS
  Filled 2016-01-18: qty 1

## 2016-01-18 MED ORDER — IBUPROFEN 100 MG/5ML PO SUSP
10.0000 mg/kg | Freq: Once | ORAL | Status: DC
Start: 2016-01-18 — End: 2016-01-18

## 2016-01-18 MED ORDER — IBUPROFEN 100 MG/5ML PO SUSP
ORAL | Status: AC
  Filled 2016-01-18: qty 30

## 2016-01-18 MED ORDER — INSULIN ASPART PROT & ASPART (70-30 MIX) 100 UNIT/ML ~~LOC~~ SUSP
7.0000 [IU] | Freq: Two times a day (BID) | SUBCUTANEOUS | Status: DC
  Administered 2016-01-19 – 2016-01-20 (×3): 7 [IU] via SUBCUTANEOUS
  Filled 2016-01-18: qty 10

## 2016-01-18 MED ORDER — INSULIN ASPART 100 UNIT/ML ~~LOC~~ SOLN
0.0000 [IU] | Freq: Three times a day (TID) | SUBCUTANEOUS | Status: DC
  Administered 2016-01-19: 5 [IU] via SUBCUTANEOUS
  Administered 2016-01-19: 11 [IU] via SUBCUTANEOUS
  Administered 2016-01-20: 3 [IU] via SUBCUTANEOUS

## 2016-01-18 MED ORDER — SODIUM CHLORIDE 0.9 % IV SOLN
INTRAVENOUS | Status: AC
  Administered 2016-01-18: 19:00:00 via INTRAVENOUS

## 2016-01-18 MED ORDER — LEVOFLOXACIN IN D5W 750 MG/150ML IV SOLN
750.0000 mg | INTRAVENOUS | Status: DC
  Administered 2016-01-18 – 2016-01-19 (×2): 750 mg via INTRAVENOUS
  Filled 2016-01-18 (×2): qty 150

## 2016-01-18 MED ORDER — ENOXAPARIN SODIUM 40 MG/0.4ML ~~LOC~~ SOLN
40.0000 mg | SUBCUTANEOUS | Status: DC
  Administered 2016-01-18 – 2016-01-19 (×2): 40 mg via SUBCUTANEOUS
  Filled 2016-01-18 (×2): qty 0.4

## 2016-01-18 MED ORDER — IBUPROFEN 200 MG PO TABS: 200.0000 mg | ORAL_TABLET | Freq: Four times a day (QID) | ORAL | Status: DC | PRN

## 2016-01-18 MED ORDER — SODIUM CHLORIDE 0.9 % IV BOLUS (SEPSIS)
2000.0000 mL | Freq: Once | INTRAVENOUS | Status: AC
  Administered 2016-01-18: 2000 mL via INTRAVENOUS

## 2016-01-18 NOTE — ED Provider Notes (Signed)
[facility] DEPT Provider Note   CSN: [id number] Arrival date & time: 01/18/16  1159  By signing my name below, I, Arianna Nassar, attest that this documentation has been prepared under the direction and in the presence of Julianne Rice, MD.  Electronically Signed: Julien Nordmann, ED Scribe. 01/18/16. 12:30 PM.    History   Chief Complaint Chief Complaint  Patient presents with  . Influenza    The history is provided by the patient. No language interpreter was used.   HPI Comments: Jonathan Austin is a 51 y.o. male who has a PMhx of DM, HTN, HLD, presents to the Emergency Department complaining of moderate, gradual worsening, flu like symptoms x 5 days. He reports associated fever (tmax 103), generalized myalgias, productive cough that brings up green/yellow sputum, nasal congestion, sinus pressure, sore throat, nausea, vomiting, and right ear pain. Pt has been taking tylenol cold medication without any relief. Pt reports that his CBG was 349 this morning when he checked this morning which is abnormally high and has been causing him to urinate frequently. Pt denies diarrhea or leg swelling.   Past Medical History:  Diagnosis Date  . Cellulitis 06/28/2013  . Cocaine abuse 06/28/2013  . Cocaine abuse with cocaine-induced mood disorder ([place])   . Depression   . Diabetes mellitus   . Scrotal infection 06/28/2013  . Sleep apnea   . Suicidal ideations     Patient Active Problem List   Diagnosis Date Noted  . Sepsis ([place]) secondary to CAP 01/20/2016  . Influenza A 01/20/2016  . AKI (acute kidney injury) ([place]) 01/20/2016  . CKD (chronic kidney disease), stage II 01/20/2016  . Hyperkalemia 01/20/2016  . CAP (community acquired pneumonia) 01/18/2016  . Hypotension 01/18/2016  . Hyperglycemia 09/20/2015  . HTN (hypertension) 09/20/2015  . HLD (hyperlipidemia) 09/20/2015  . Substance abuse 08/30/2014  . Substance induced mood disorder ([place]) 05/07/2014  . Cocaine abuse with  cocaine-induced mood disorder ([place])   . Alcohol abuse   . Fever 02/12/2014  . Sinus tachycardia 02/12/2014  . Marijuana abuse 06/28/2013  . Leukocytosis 06/28/2013  . Hyponatremia 06/28/2013  . Diabetes mellitus type II, uncontrolled ([place]) 12/09/2012    Past Surgical History:  Procedure Laterality Date  . TONSILLECTOMY         Home Medications    Prior to Admission medications   Medication Sig Start Date End Date Taking? Authorizing Provider  ibuprofen (ADVIL,MOTRIN) 200 MG tablet Take 200-400 mg by mouth every 6 (six) hours as needed (pain).    Yes Historical Provider, MD  amLODipine (NORVASC) 5 MG tablet Take 1 tablet (5 mg total) by mouth daily. Patient not taking: Reported on 01/18/2016 09/03/14   Benjamine Mola, FNP  doxycycline (VIBRAMYCIN) 100 MG capsule Take 1 capsule (100 mg total) by mouth 2 (two) times daily. 01/20/16   Venetia Maxon Rama, MD  insulin NPH-regular Human (NOVOLIN 70/30) (70-30) 100 UNIT/ML injection Inject 7-9 Units into the skin See admin instructions. Inject 9 units subcutaneously with breakfast and 7 units with supper 01/20/16   Venetia Maxon Rama, MD  Insulin Syringe-Needle U-100 31G X 15/64" 1 ML MISC To inject insulin daily 09/21/15   Ophelia Shoulder, MD  oseltamivir (TAMIFLU) 75 MG capsule Take 1 capsule (75 mg total) by mouth 2 (two) times daily. 01/20/16   Venetia Maxon Rama, MD    Family History Family History  Problem Relation Age of Onset  . Hypertension Mother   . Hypertension Father   . Diabetes Father  Social History Social History  Substance Use Topics  . Smoking status: Never Smoker  . Smokeless tobacco: Never Used  . Alcohol use 1.8 oz/week    1 Glasses of wine, 1 Cans of beer, 1 Shots of liquor per week     Comment: Occasionally     Allergies   Patient has no known allergies.   Review of Systems Review of Systems  Constitutional: Positive for diaphoresis, fatigue and fever.  HENT: Positive for congestion, ear pain, sinus  pressure and sore throat. Negative for trouble swallowing and voice change.   Eyes: Negative for photophobia and visual disturbance.  Respiratory: Positive for cough and shortness of breath. Negative for chest tightness and wheezing.   Cardiovascular: Negative for chest pain, palpitations and leg swelling.  Gastrointestinal: Positive for nausea and vomiting. Negative for abdominal pain, blood in stool, constipation and diarrhea.  Genitourinary: Positive for frequency. Negative for difficulty urinating, dysuria, flank pain and hematuria.  Musculoskeletal: Positive for myalgias. Negative for back pain, joint swelling, neck pain and neck stiffness.  Skin: Negative for rash.  Neurological: Negative for dizziness, syncope, weakness, light-headedness, numbness and headaches.  All other systems reviewed and are negative.    Physical Exam Updated Vital Signs BP 128/82 (BP Location: Left Arm)   Pulse (!) 105   Temp 97.9 F (36.6 C) (Oral)   Resp 16   Ht 5\' 7"  (1.702 m)   Wt 225 lb (102.1 kg)   SpO2 96%   BMI 35.24 kg/m   Physical Exam  Constitutional: He is oriented to person, place, and time. He appears well-developed and well-nourished. No distress.  HENT:  Head: Normocephalic and atraumatic.  Mouth/Throat: Oropharynx is clear and moist.  Bilateral nasal mucosal edema. Bulging bilateral TMs. Erythematous oropharynx without tonsillar exudates. Uvula is midline. Patient with diffuse sinus tenderness to percussion especially in the right frontal and maxillary sinuses.  Eyes: EOM are normal. Pupils are equal, round, and reactive to light.  Neck: Normal range of motion. Neck supple. No JVD present.  No meningismus  Cardiovascular: Regular rhythm.  Exam reveals no gallop and no friction rub.   No murmur heard. Tachycardia  Pulmonary/Chest: Effort normal. No respiratory distress. He has no wheezes. He has no rales. He exhibits no tenderness.  Diminished in bilateral bases  Abdominal: Soft.  Bowel sounds are normal. There is no tenderness. There is no rebound and no guarding.  Musculoskeletal: Normal range of motion. He exhibits no edema or tenderness.  No lower extremity swelling, asymmetry or tenderness.  Lymphadenopathy:    He has no cervical adenopathy.  Neurological: He is alert and oriented to person, place, and time.  Moves all extremities without deficit. Sensation intact.  Skin: Skin is warm and dry. No rash noted. No erythema.  Psychiatric: He has a normal mood and affect. His behavior is normal.  Nursing note and vitals reviewed.    ED Treatments / Results  DIAGNOSTIC STUDIES: Oxygen Saturation is 95% on RA, adequate by my interpretation.  COORDINATION OF CARE:  12:28 PM Discussed treatment plan which includes CXR and IV fluids with pt at bedside and pt agreed to plan.  Labs (all labs ordered are listed, but only abnormal results are displayed) Labs Reviewed  COMPREHENSIVE METABOLIC PANEL - Abnormal; Notable for the following:       Result Value   Sodium 129 (*)    Potassium 5.5 (*)    Chloride 91 (*)    Glucose, Bld 414 (*)    Creatinine, Ser  1.67 (*)    Albumin 3.4 (*)    GFR calc non Af Amer 46 (*)    GFR calc Af Amer 54 (*)    All other components within normal limits  CBC WITH DIFFERENTIAL/PLATELET - Abnormal; Notable for the following:    Monocytes Absolute 1.2 (*)    All other components within normal limits  URINALYSIS, ROUTINE W REFLEX MICROSCOPIC - Abnormal; Notable for the following:    Glucose, UA >=500 (*)    Ketones, ur 5 (*)    Protein, ur 100 (*)    Squamous Epithelial / LPF 0-5 (*)    All other components within normal limits  INFLUENZA PANEL BY PCR (TYPE A & B, H1N1) - Abnormal; Notable for the following:    Influenza A By PCR POSITIVE (*)    All other components within normal limits  RAPID URINE DRUG SCREEN, HOSP PERFORMED - Abnormal; Notable for the following:    Cocaine POSITIVE (*)    All other components within normal  limits  COMPREHENSIVE METABOLIC PANEL - Abnormal; Notable for the following:    Sodium 133 (*)    Chloride 98 (*)    Glucose, Bld 224 (*)    Creatinine, Ser 1.32 (*)    Calcium 8.5 (*)    Total Protein 6.2 (*)    Albumin 2.9 (*)    All other components within normal limits  BASIC METABOLIC PANEL - Abnormal; Notable for the following:    Chloride 98 (*)    Glucose, Bld 188 (*)    Creatinine, Ser 1.50 (*)    GFR calc non Af Amer 53 (*)    All other components within normal limits  GLUCOSE, CAPILLARY - Abnormal; Notable for the following:    Glucose-Capillary 273 (*)    All other components within normal limits  GLUCOSE, CAPILLARY - Abnormal; Notable for the following:    Glucose-Capillary 232 (*)    All other components within normal limits  GLUCOSE, CAPILLARY - Abnormal; Notable for the following:    Glucose-Capillary 212 (*)    All other components within normal limits  GLUCOSE, CAPILLARY - Abnormal; Notable for the following:    Glucose-Capillary 202 (*)    All other components within normal limits  GLUCOSE, CAPILLARY - Abnormal; Notable for the following:    Glucose-Capillary 307 (*)    All other components within normal limits  GLUCOSE, CAPILLARY - Abnormal; Notable for the following:    Glucose-Capillary 115 (*)    All other components within normal limits  GLUCOSE, CAPILLARY - Abnormal; Notable for the following:    Glucose-Capillary 171 (*)    All other components within normal limits  GLUCOSE, CAPILLARY - Abnormal; Notable for the following:    Glucose-Capillary 178 (*)    All other components within normal limits  GLUCOSE, CAPILLARY - Abnormal; Notable for the following:    Glucose-Capillary 131 (*)    All other components within normal limits  CBG MONITORING, ED - Abnormal; Notable for the following:    Glucose-Capillary 402 (*)    All other components within normal limits  CBG MONITORING, ED - Abnormal; Notable for the following:    Glucose-Capillary 348 (*)      All other components within normal limits  CBG MONITORING, ED - Abnormal; Notable for the following:    Glucose-Capillary 292 (*)    All other components within normal limits  CULTURE, BLOOD (ROUTINE X 2)  CULTURE, BLOOD (ROUTINE X 2)  CULTURE, EXPECTORATED SPUTUM-ASSESSMENT  GRAM  STAIN  ETHANOL  HIV ANTIBODY (ROUTINE TESTING)  STREP PNEUMONIAE URINARY ANTIGEN  CBC    EKG  EKG Interpretation  Date/Time:  Monday January 18 2016 12:22:14 EST Ventricular Rate:  137 PR Interval:  126 QRS Duration: 70 QT Interval:  276 QTC Calculation: 416 R Axis:   53 Text Interpretation:  Sinus tachycardia Biatrial enlargement Abnormal ECG Confirmed by Lita Mains  MD, Dreon Pineda ([id number]) on 01/18/2016 3:50:15 PM       Radiology No results found.  Procedures Procedures (including critical care time)  Medications Ordered in ED Medications  ketorolac (TORADOL) 30 MG/ML injection 30 mg (30 mg Intravenous Given 01/18/16 1256)  sodium chloride 0.9 % bolus 2,000 mL (0 mLs Intravenous Stopped 01/18/16 1431)  levofloxacin (LEVAQUIN) IVPB 500 mg (0 mg Intravenous Stopped 01/18/16 1538)  acetaminophen (TYLENOL) tablet 650 mg (650 mg Oral Given 01/18/16 1356)  0.9 %  sodium chloride infusion ( Intravenous New Bag/Given 01/18/16 1830)     Initial Impression / Assessment and Plan / ED Course  I have reviewed the triage vital signs and the nursing notes.  Pertinent labs & imaging results that were available during my care of the patient were reviewed by me and considered in my medical decision making (see chart for details).  Clinical Course   Patient with persistent tachycardia despite IV fluids and improvement of low grade fever. Evidence of dehydration on blood work with elevation in creatinine. Questionable early pneumonia on x-ray. Initiated IV antibiotics in the emergency department. Discuss with hospitalist and will admit to telemetry bed.  I personally performed the services described in this  documentation, which was scribed in my presence. The recorded information has been reviewed and is accurate.    Final Clinical Impressions(s) / ED Diagnoses   Final diagnoses:  Community acquired pneumonia, unspecified laterality  Tachycardia  Dehydration  Hyperkalemia  Hyperglycemia    New Prescriptions Discharge Medication List as of 01/20/2016 12:24 PM    START taking these medications   Details  doxycycline (VIBRAMYCIN) 100 MG capsule Take 1 capsule (100 mg total) by mouth 2 (two) times daily., Starting Wed 01/20/2016, Normal    oseltamivir (TAMIFLU) 75 MG capsule Take 1 capsule (75 mg total) by mouth 2 (two) times daily., Starting Wed 01/20/2016, Normal         Julianne Rice, MD 01/23/16 [phone]

## 2016-01-18 NOTE — H&P (Addendum)
History and Physical    Donnelle Mincy [id number] DOB: 15-May-1965 DOA: 01/18/2016  Referring MD/NP/PA: Dr. Lita Mains   PCP: Pcp Not In System   Patient coming from: Home   Chief Complaint: Dyspnea and cough   HPI: Jonathan Austin is a 50 y.o. male with medical history significant of DM, alcohol use, presented to [facility] ED with main concern of several days duration of progressively worsening dyspnea that initially started with exertion and has progressed to dyspnea at rest. This was associated with mixed non productive and productive cough of yellow sputum, subjective fevers, chills, weakness. Pt reports similar symptoms two years ago when he was diagnosed with PNA and was hospitalized for 4 drays. He denies chest pain, no abd or urinary concerns, no recent sick contacts or exposures.   ED Course: Pt hemodynamically stable but HR in 120's. Blood work notable for Cr up to 1.6 and K 5.5. CXR worrisome for development of early PNA. THR asked to admit for further evaluation.   Review of Systems:  Constitutional: Negative for appetite change and fatigue.  HENT: Negative for ear pain, nosebleeds, congestion, facial swelling, rhinorrhea, neck pain, neck stiffness and ear discharge.   Eyes: Negative for pain, discharge, redness, itching and visual disturbance.  Respiratory: Negative for wheezing and stridor.   Cardiovascular: Negative for palpitations and leg swelling.  Gastrointestinal: Negative for abdominal distention.  Genitourinary: Negative for dysuria, urgency, frequency, hematuria, flank pain, decreased urine volume, difficulty urinating and dyspareunia.  Musculoskeletal: Negative for back pain, joint swelling, arthralgias and gait problem.  Neurological: Negative for dizziness, tremors, seizures, syncope, facial asymmetry, speech difficulty, weakness Hematological: Negative for adenopathy. Does not bruise/bleed easily.  Psychiatric/Behavioral: Negative for hallucinations, behavioral  problems, confusion, dysphoric mood  Past Medical History:  Diagnosis Date  . Depression   . Diabetes mellitus   . Sleep apnea     Past Surgical History:  Procedure Laterality Date  . TONSILLECTOMY     Social hx:  reports that he has never smoked. He has never used smokeless tobacco. He reports that he drinks about 1.8 oz of alcohol per week . He reports that he uses drugs, including Cocaine and Marijuana.  No Known Allergies  Family History  Problem Relation Age of Onset  . Hypertension Mother   . Hypertension Father   . Diabetes Father     Prior to Admission medications   Medication Sig Start Date End Date Taking? Authorizing Provider  amLODipine (NORVASC) 5 MG tablet Take 1 tablet (5 mg total) by mouth daily. Patient not taking: Reported on 12/10/2015 09/03/14   Benjamine Mola, FNP  Carboxymethylcellul-Glycerin (CLEAR EYES FOR DRY EYES OP) Apply 1-2 drops to eye 3 (three) times daily as needed (for dryness).    Historical Provider, MD  glipiZIDE (GLUCOTROL) 10 MG tablet Take 1 tablet (10 mg total) by mouth daily before breakfast. 09/03/14   Benjamine Mola, FNP  hydrOXYzine (ATARAX/VISTARIL) 25 MG tablet Take 1 tablet (25 mg total) by mouth every 6 (six) hours as needed for anxiety. Patient not taking: Reported on 12/10/2015 05/08/14   Shuvon B Rankin, NP  ibuprofen (ADVIL,MOTRIN) 200 MG tablet Take 200-400 mg by mouth every 6 (six) hours as needed (for pain).    Historical Provider, MD  insulin NPH-regular Human (NOVOLIN 70/30) (70-30) 100 UNIT/ML injection Inject 7 Units into the skin 2 (two) times daily with a meal. Patient taking differently: Inject 12 Units into the skin 2 (two) times daily with a meal.  09/21/15  Ophelia Shoulder, MD  Insulin Syringe-Needle U-100 31G X 15/64" 1 ML MISC To inject insulin daily 09/21/15   Ophelia Shoulder, MD  lisinopril (PRINIVIL,ZESTRIL) 10 MG tablet Take 1 tablet (10 mg total) by mouth daily. Patient not taking: Reported on 12/10/2015 09/03/14   Benjamine Mola, FNP  loratadine (CLARITIN) 10 MG tablet Take 1 tablet (10 mg total) by mouth daily. Patient not taking: Reported on 12/10/2015 09/03/14   Benjamine Mola, FNP  metFORMIN (GLUCOPHAGE) 500 MG tablet Take 1 tablet (500 mg total) by mouth daily. 09/03/14   Benjamine Mola, FNP  sertraline (ZOLOFT) 50 MG tablet Take 1 tablet (50 mg total) by mouth daily. For depression Patient not taking: Reported on 12/10/2015 09/03/14   Benjamine Mola, FNP  simvastatin (ZOCOR) 10 MG tablet Take 1 tablet (10 mg total) by mouth daily at 6 PM. For Hyperlipidemia Patient not taking: Reported on 12/10/2015 09/03/14   Benjamine Mola, FNP  traZODone (DESYREL) 50 MG tablet Take 1 tablet (50 mg total) by mouth at bedtime as needed for sleep. Patient not taking: Reported on 12/10/2015 09/03/14   Benjamine Mola, FNP    Physical Exam: Vitals:   01/18/16 1218 01/18/16 1350 01/18/16 1457  BP: 123/82 123/75 108/71  Pulse: (!) 140 (!) 125 (!) 121  Resp: 20 16 15   Temp: 100.6 F (38.1 C) 100.7 F (38.2 C) 99.1 F (37.3 C)  TempSrc: Oral Oral Oral  SpO2: 95% 96% 95%    Constitutional: NAD, calm, comfortable Vitals:   01/18/16 1218 01/18/16 1350 01/18/16 1457  BP: 123/82 123/75 108/71  Pulse: (!) 140 (!) 125 (!) 121  Resp: 20 16 15   Temp: 100.6 F (38.1 C) 100.7 F (38.2 C) 99.1 F (37.3 C)  TempSrc: Oral Oral Oral  SpO2: 95% 96% 95%   Eyes: PERRL, lids and conjunctivae normal ENMT: Mucous membranes are moist. Posterior pharynx clear of any exudate or lesions.Normal dentition.  Neck: normal, supple, no masses, no thyromegaly Respiratory:  Normal respiratory effort. No accessory muscle use. Scattered rhonchi at bases.  Cardiovascular: Regular rhythm, tachycardic, no murmurs / rubs / gallops. No extremity edema. 2+ pedal pulses. No carotid bruits.  Abdomen: no tenderness, no masses palpated. No hepatosplenomegaly. Bowel sounds positive.  Musculoskeletal: no clubbing / cyanosis. No joint deformity upper and lower  extremities. Good ROM, no contractures. Normal muscle tone.  Skin: no rashes, lesions, ulcers. No induration Neurologic: CN 2-12 grossly intact. Sensation intact, DTR normal. Strength 5/5 in all 4.  Psychiatric: Normal judgment and insight. Alert and oriented x 3. Normal mood.   Labs on Admission: I have personally reviewed following labs and imaging studies  CBC:  Recent Labs Lab 01/18/16 1258  WBC 9.9  NEUTROABS 7.0  HGB 16.0  HCT 46.5  MCV 88.1  PLT 123456   Basic Metabolic Panel:  Recent Labs Lab 01/18/16 1258  NA 129*  K 5.5*  CL 91*  CO2 28  GLUCOSE 414*  BUN 14  CREATININE 1.67*  CALCIUM 9.3   Liver Function Tests:  Recent Labs Lab 01/18/16 1258  AST 27  ALT 21  ALKPHOS 48  BILITOT 0.7  PROT 6.8  ALBUMIN 3.4*   CBG:  Recent Labs Lab 01/18/16 1239 01/18/16 1347 01/18/16 1454  GLUCAP 402* 348* 292*  \ Urine analysis:    Component Value Date/Time   COLORURINE YELLOW 01/18/2016 [place] 01/18/2016 1309   LABSPEC 1.023 01/18/2016 1309   PHURINE 6.0 01/18/2016 1309  GLUCOSEU >=500 (A) 01/18/2016 1309   HGBUR NEGATIVE 01/18/2016 1309   BILIRUBINUR NEGATIVE 01/18/2016 1309   KETONESUR 5 (A) 01/18/2016 1309   PROTEINUR 100 (A) 01/18/2016 1309   UROBILINOGEN 0.2 12/17/2014 1649   NITRITE NEGATIVE 01/18/2016 1309   LEUKOCYTESUR NEGATIVE 01/18/2016 1309    Radiological Exams on Admission: Dg Chest 2 View  Result Date: 01/18/2016 CLINICAL DATA:  Cough and fever to 103 degrees for the past several days. History of substance abuse. EXAM: CHEST  2 VIEW COMPARISON:  PA and lateral chest x-ray of April 13, 2014 FINDINGS: The lungs are adequately inflated. The interstitial markings are coarse. There is no alveolar infiltrate. There is no pleural effusion. The heart and pulmonary vascularity are normal. The mediastinum is normal in width. There is calcification in the wall of the aortic arch. The bony thorax is unremarkable. IMPRESSION: Mild  interstitial prominence more conspicuous than in the past may reflect acute bronchitis or early interstitial pneumonia. There is no alveolar pneumonia nor CHF. Electronically Signed   By: David  Martinique M.D.   On: 01/18/2016 13:18    EKG: sinus tachy   Assessment/Plan Active Problems:   CAP (community acquired pneumonia) - bilateral interstitial infiltrates suspicious for PNA, unknown pathogen - admit to telemetry unit - ABX Levaquin should be adequate - continue with IVF - obtain sputum culture, urine legionella and strep pneumo    Tachycardia - possible reactive from the above - can not exclude alcohol related, ? Withdrawal - pt is alert on exam and does not appear uncomfortable, no sings of intoxications - EtOH level pending  - continue IVF    Acute kidney injury imposed on CKD stage II - III - suspect pre renal etiology - continue with IVF, hold lisinopril and metformin for now - BMP in AM    DM type II with complications of nephropathy  - continue home medical regimen - check CBG per protocol     Hyperkalemia - will repeat BMP tonight - may need kayexalate but will hold off for now until repeat BMP done  - hold lisinopril for now    Hypertension, essential - hold Norvasc and lisinopril for now until BP stabilizes   DVT prophylaxis: Lovenox SQ Code Status: Full  Family Communication: Pt and wife updated at bedside Disposition Plan: Home in 2-3 days  Consults called: None Admission status: Inpatient   Faye Ramsay MD [facility] Pager [phone]  If 7PM-7AM, please contact night-coverage [email or web address] Password [email or web address]  01/18/2016, 3:59 PM

## 2016-01-18 NOTE — ED Triage Notes (Signed)
Pt arrives via POV from home with cough, congestion, runny. Developed fever at home on Friday. Here with general malaise not feeling well. Lungs CTA.

## 2016-01-18 NOTE — Progress Notes (Signed)
Received report from ED RN .  Pt arrived via stretcher- PT is on droplet precautions, + for influenza.   Patient is alert and orientated x4.   Peripheral IV 20 G in right AC NSL.   Patient denies any pain at the moment. Pt was orientated to room, call light and phone are within reach.    Paulla Fore, RN

## 2016-01-19 DIAGNOSIS — I959 Hypotension, unspecified: Secondary | ICD-10-CM

## 2016-01-19 DIAGNOSIS — J189 Pneumonia, unspecified organism: Secondary | ICD-10-CM | POA: Diagnosis not present

## 2016-01-19 LAB — COMPREHENSIVE METABOLIC PANEL
ALK PHOS: 38 U/L (ref 38–126)
ALT: 18 U/L (ref 17–63)
ANION GAP: 10 (ref 5–15)
AST: 20 U/L (ref 15–41)
Albumin: 2.9 g/dL — ABNORMAL LOW (ref 3.5–5.0)
BILIRUBIN TOTAL: 0.4 mg/dL (ref 0.3–1.2)
BUN: 11 mg/dL (ref 6–20)
CALCIUM: 8.5 mg/dL — AB (ref 8.9–10.3)
CO2: 25 mmol/L (ref 22–32)
Chloride: 98 mmol/L — ABNORMAL LOW (ref 101–111)
Creatinine, Ser: 1.32 mg/dL — ABNORMAL HIGH (ref 0.61–1.24)
GFR calc non Af Amer: >60 mL/min (ref >60)
Glucose, Bld: 224 mg/dL — ABNORMAL HIGH (ref 65–99)
Potassium: 4.8 mmol/L (ref 3.5–5.1)
Sodium: 133 mmol/L — ABNORMAL LOW (ref 135–145)
TOTAL PROTEIN: 6.2 g/dL — AB (ref 6.5–8.1)

## 2016-01-19 LAB — GLUCOSE, CAPILLARY
GLUCOSE-CAPILLARY: 232 mg/dL — AB (ref 65–99)
GLUCOSE-CAPILLARY: 115 mg/dL — AB (ref 65–99)
Glucose-Capillary: 212 mg/dL — ABNORMAL HIGH (ref 65–99)
Glucose-Capillary: 202 mg/dL — ABNORMAL HIGH (ref 65–99)
Glucose-Capillary: 307 mg/dL — ABNORMAL HIGH (ref 65–99)
Glucose-Capillary: 171 mg/dL — ABNORMAL HIGH (ref 65–99)

## 2016-01-19 LAB — STREP PNEUMONIAE URINARY ANTIGEN: Strep Pneumo Urinary Antigen: NEGATIVE

## 2016-01-19 LAB — HIV ANTIBODY (ROUTINE TESTING W REFLEX): HIV Screen 4th Generation wRfx: NONREACTIVE

## 2016-01-19 MED ORDER — OSELTAMIVIR PHOSPHATE 75 MG PO CAPS
75.0000 mg | ORAL_CAPSULE | Freq: Two times a day (BID) | ORAL | Status: DC
  Administered 2016-01-19 – 2016-01-20 (×3): 75 mg via ORAL
  Filled 2016-01-19 (×4): qty 1

## 2016-01-19 NOTE — Progress Notes (Signed)
Patient ID: Jonathan Austin, male   DOB: 1965-02-13, 50 y.o.   MRN: [id number]    PROGRESS NOTE    Jonathan Austin  QXA:[id number] DOB: 1965-02-20 DOA: 01/18/2016  PCP: Pt has no PCP  Brief Narrative:  50 y.o. male with medical history significant of DM, alcohol use, presented to [facility] ED with main concern of several days duration of progressively worsening dyspnea that initially started with exertion and has progressed to dyspnea at rest. This was associated with mixed non productive and productive cough of yellow sputum, subjective fevers, chills, weakness. Pt reports similar symptoms two years ago when he was diagnosed with PNA and was hospitalized for 4 drays. He denies chest pain, no abd or urinary concerns, no recent sick contacts or exposures.   ED Course: Pt hemodynamically stable but HR in 120's. Blood work notable for Cr up to 1.6 and K 5.5. CXR worrisome for development of early PNA. THR asked to admit for further evaluation  Assessment & Plan:   Assessment/Plan Active Problems:   Sepsis secondary to CAP (community acquired pneumonia) - bilateral interstitial infiltrates suspicious for PNA, influenza test positive - pt met criteria for sepsis with T 100.7 F, HR > 90's, source influenza  PNA and possibly bacterial as well  - added tamiflu today 12/12, will continue Levaquin for now until we make sure no fevers at least and once sputum results are back  - continue with IVF - obtain sputum culture, urine legionella and strep pneumo    Tachycardia - possible reactive from the above - can not exclude alcohol related, ? 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 - suspect pre renal etiology - improving with IVF  - continue with IVF, hold lisinopril and metformin for now until renal function stabilizes  - BMP in AM    DM type II with  complications of nephropathy  - continue home medical regimen - check CBG per protocol     Hyperkalemia - hold lisinopril for now - resolved - BMP In AM    Hypertension, essential - hold Norvasc and lisinopril for now until BP stabilizes   DVT prophylaxis: Lovenox SQ Code Status: Full  Family Communication: Patient at bedside  Disposition Plan: Home in 1-2 days   Consultants:   None  Procedures:   None   Antimicrobials:   Levaquin 12/11  Tamiflu 12/12 ->   Subjective: Pt reports feeling better but still feels tired and weak.   Objective: Vitals:   01/18/16 1618 01/18/16 2212 01/19/16 0445 01/19/16 1000  BP: 120/72 126/86 114/86   Pulse: 106 (!) 115 (!) 113   Resp: 16 16 16    Temp:  98.6 F (37 C) 100.3 F (37.9 C)   TempSrc:  Oral Oral   SpO2: 96% 98% 98%   Weight:    104.3 kg (229 lb 15 oz)    Intake/Output Summary (Last 24 hours) at 01/19/16 1142 Last data filed at 01/19/16 0500  Gross per 24 hour  Intake           3367.5 ml  Output              550 ml  Net           2817.5 ml   Filed Weights   01/19/16 1000  Weight: 104.3 kg (229 lb  15 oz)    Examination:  General exam: Appears calm and comfortable  Respiratory system: Clear to auscultation. Respiratory effort normal. Cardiovascular system: S1 & S2 heard, RRR. No JVD, murmurs, rubs, gallops or clicks. No pedal edema. Gastrointestinal system: Abdomen is nondistended, soft and nontender. No organomegaly or masses felt. Normal bowel sounds heard. Central nervous system: Alert and oriented. No focal neurological deficits. Extremities: Symmetric 5 x 5 power. Skin: No rashes, lesions or ulcers Psychiatry: Judgement and insight appear normal. Mood & affect appropriate.    Data Reviewed: I have personally reviewed following labs and imaging studies  CBC:  Recent Labs Lab 01/18/16 1258 01/18/16 1834  WBC 9.9 7.0  NEUTROABS 7.0  --   HGB 16.0 16.2  HCT 46.5 47.2  MCV 88.1 88.6  PLT 253  677   Basic Metabolic Panel:  Recent Labs Lab 01/18/16 1258 01/18/16 1834 01/19/16 0606  NA 129* 135 133*  K 5.5* 4.1 4.8  CL 91* 98* 98*  CO2 28 27 25   GLUCOSE 414* 188* 224*  BUN 14 14 11   CREATININE 1.67* 1.50* 1.32*  CALCIUM 9.3 9.2 8.5*   GFR: Estimated Creatinine Clearance: 77.1 mL/min (by C-G formula based on SCr of 1.32 mg/dL (H)). Liver Function Tests:  Recent Labs Lab 01/18/16 1258 01/19/16 0606  AST 27 20  ALT 21 18  ALKPHOS 48 38  BILITOT 0.7 0.4  PROT 6.8 6.2*  ALBUMIN 3.4* 2.9*   No results for input(s): LIPASE, AMYLASE in the last 168 hours. No results for input(s): AMMONIA in the last 168 hours. Coagulation Profile: No results for input(s): INR, PROTIME in the last 168 hours. Cardiac Enzymes: No results for input(s): CKTOTAL, CKMB, CKMBINDEX, TROPONINI in the last 168 hours. BNP (last 3 results) No results for input(s): PROBNP in the last 8760 hours. HbA1C: No results for input(s): HGBA1C in the last 72 hours. CBG:  Recent Labs Lab 01/18/16 1454 01/18/16 2206 01/19/16 0315 01/19/16 0653 01/19/16 0821  GLUCAP 292* 273* 232* 212* 202*   Lipid Profile: No results for input(s): CHOL, HDL, LDLCALC, TRIG, CHOLHDL, LDLDIRECT in the last 72 hours. Thyroid Function Tests: No results for input(s): TSH, T4TOTAL, FREET4, T3FREE, THYROIDAB in the last 72 hours. Anemia Panel: No results for input(s): VITAMINB12, FOLATE, FERRITIN, TIBC, IRON, RETICCTPCT in the last 72 hours. Urine analysis:    Component Value Date/Time   COLORURINE YELLOW 01/18/2016 1309   APPEARANCEUR CLEAR 01/18/2016 1309   LABSPEC 1.023 01/18/2016 1309   PHURINE 6.0 01/18/2016 1309   GLUCOSEU >=500 (A) 01/18/2016 1309   HGBUR NEGATIVE 01/18/2016 1309   BILIRUBINUR NEGATIVE 01/18/2016 1309   KETONESUR 5 (A) 01/18/2016 1309   PROTEINUR 100 (A) 01/18/2016 1309   UROBILINOGEN 0.2 12/17/2014 1649   NITRITE NEGATIVE 01/18/2016 1309   LEUKOCYTESUR NEGATIVE 01/18/2016 1309    Sepsis Labs: @LABRCNTIP (procalcitonin:4,lacticidven:4)  )No results found for this or any previous visit (from the past 240 hour(s)).    Radiology Studies: Dg Chest 2 View  Result Date: 01/18/2016 CLINICAL DATA:  Cough and fever to 103 degrees for the past several days. History of substance abuse. EXAM: CHEST  2 VIEW COMPARISON:  PA and lateral chest x-ray of April 13, 2014 FINDINGS: The lungs are adequately inflated. The interstitial markings are coarse. There is no alveolar infiltrate. There is no pleural effusion. The heart and pulmonary vascularity are normal. The mediastinum is normal in width. There is calcification in the wall of the aortic arch. The bony thorax is unremarkable. IMPRESSION: Mild  interstitial prominence more conspicuous than in the past may reflect acute bronchitis or early interstitial pneumonia. There is no alveolar pneumonia nor CHF. Electronically Signed   By: David  Martinique M.D.   On: 01/18/2016 13:18      Scheduled Meds: . enoxaparin (LOVENOX) injection  40 mg Subcutaneous Q24H  . insulin aspart  0-15 Units Subcutaneous TID WC  . insulin aspart protamine- aspart  7 Units Subcutaneous BID WC  . levofloxacin (LEVAQUIN) IV  750 mg Intravenous Q24H  . oseltamivir  75 mg Oral BID   Continuous Infusions: . sodium chloride 75 mL/hr at 01/18/16 2216     LOS: 1 day    Time spent: 20 minutes    Faye Ramsay, MD [facility] Pager [phone]  If 7PM-7AM, please contact night-coverage [email or web address] Password [facility] 01/19/2016, 11:42 AM

## 2016-01-20 ENCOUNTER — Encounter (HOSPITAL_COMMUNITY): Payer: Self-pay | Admitting: Internal Medicine

## 2016-01-20 DIAGNOSIS — A419 Sepsis, unspecified organism: Secondary | ICD-10-CM | POA: Diagnosis not present

## 2016-01-20 DIAGNOSIS — J101 Influenza due to other identified influenza virus with other respiratory manifestations: Secondary | ICD-10-CM

## 2016-01-20 DIAGNOSIS — I10 Essential (primary) hypertension: Secondary | ICD-10-CM

## 2016-01-20 DIAGNOSIS — N179 Acute kidney failure, unspecified: Secondary | ICD-10-CM | POA: Diagnosis not present

## 2016-01-20 DIAGNOSIS — R000 Tachycardia, unspecified: Secondary | ICD-10-CM

## 2016-01-20 DIAGNOSIS — J189 Pneumonia, unspecified organism: Secondary | ICD-10-CM | POA: Diagnosis not present

## 2016-01-20 DIAGNOSIS — E1122 Type 2 diabetes mellitus with diabetic chronic kidney disease: Secondary | ICD-10-CM

## 2016-01-20 DIAGNOSIS — N289 Disorder of kidney and ureter, unspecified: Secondary | ICD-10-CM | POA: Diagnosis present

## 2016-01-20 DIAGNOSIS — N182 Chronic kidney disease, stage 2 (mild): Secondary | ICD-10-CM

## 2016-01-20 DIAGNOSIS — F1910 Other psychoactive substance abuse, uncomplicated: Secondary | ICD-10-CM

## 2016-01-20 DIAGNOSIS — Z794 Long term (current) use of insulin: Secondary | ICD-10-CM

## 2016-01-20 DIAGNOSIS — N183 Chronic kidney disease, stage 3 (moderate): Secondary | ICD-10-CM

## 2016-01-20 DIAGNOSIS — E871 Hypo-osmolality and hyponatremia: Secondary | ICD-10-CM

## 2016-01-20 DIAGNOSIS — E1165 Type 2 diabetes mellitus with hyperglycemia: Secondary | ICD-10-CM

## 2016-01-20 DIAGNOSIS — R509 Fever, unspecified: Secondary | ICD-10-CM

## 2016-01-20 DIAGNOSIS — E875 Hyperkalemia: Secondary | ICD-10-CM

## 2016-01-20 LAB — GLUCOSE, CAPILLARY
GLUCOSE-CAPILLARY: 131 mg/dL — AB (ref 65–99)
Glucose-Capillary: 178 mg/dL — ABNORMAL HIGH (ref 65–99)

## 2016-01-20 MED ORDER — OSELTAMIVIR PHOSPHATE 75 MG PO CAPS: 75.0000 mg | ORAL_CAPSULE | Freq: Two times a day (BID) | ORAL | 0 refills | Status: DC

## 2016-01-20 MED ORDER — INSULIN NPH ISOPHANE & REGULAR (70-30) 100 UNIT/ML ~~LOC~~ SUSP: 7.0000 [IU] | SUBCUTANEOUS | Status: DC

## 2016-01-20 MED ORDER — DOXYCYCLINE HYCLATE 100 MG PO CAPS: 100.0000 mg | ORAL_CAPSULE | Freq: Two times a day (BID) | ORAL | 0 refills | Status: DC

## 2016-01-20 MED ORDER — LEVOFLOXACIN 750 MG PO TABS: 750.0000 mg | ORAL_TABLET | Freq: Every evening | ORAL | Status: DC

## 2016-01-20 NOTE — Discharge Summary (Signed)
Physician Discharge Summary  Cyle Hafley [id number] DOB: 06-09-1965 DOA: 01/18/2016  PCP: Pcp Not In System, referred to [facility]  Admit date: 01/18/2016 Discharge date: 01/20/2016  Admitted From: Home Discharge disposition: Home   Recommendations for Outpatient Follow-Up:   1. Recommend close follow-up of glycemic control and blood pressure control. 2. Follow-up final blood culture results.   Discharge Diagnosis:   Principal Problem:    Sepsis ([place]) secondary to CAP/Influenza A Active Problems:    Diabetes mellitus type II, uncontrolled ([place])    Hyponatremia    Fever    Sinus tachycardia    Substance abuse    HTN (hypertension)    CAP (community acquired pneumonia)    Hypotension    Influenza A    AKI (acute kidney injury) ([place])    CKD (chronic kidney disease), stage II    Hyperkalemia  Discharge Condition: Improved.  Diet recommendation: Low sodium, heart healthy.  Carbohydrate-modified.     History of Present Illness:   Jonathan Austin is an 50 y.o. male with a PMH of diabetes and alcohol abuse who was admitted on 01/18/16 with sepsis secondary to pneumonia.  Hospital Course by Problem:   Principal problem:  Sepsis secondary to CAP (community acquired pneumonia) Chest x-ray on admission showed bilateral interstitial infiltrates suspicious for PNA, influenza test positive for influenza A. Patient was empirically treated with Levaquin while in the hospital, and Tamiflu was added after his flu test came back positive. Strep pneumonia antigen testing was negative. We'll discharge on additional 5 days of doxycycline and Tamiflu. Case management assisted with obtaining his medications as compliance has been an issue for him due to cost concerns.  Active problems:  Tachycardia Felt to be multifactorial with possible alcohol withdrawal and sepsis both contributory.  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  Creatinine at discharge was 1.32. Baseline creatinine is 1.02.  DM type II with complications of nephropathy  Discharge on 70/30 insulin, has not been compliant with oral hypoglycemics. Close follow-up recommended. Compliance appears to be an issue.  Hyperkalemia Lisinopril discontinued.  Hypertension, essential Resume Norvasc. Lisinopril discontinued secondary to hyperkalemia.    Hyponatremia Likely secondary to alcoholism. Sodium improved at discharge.   Medical Consultants:    None.   Discharge Exam:   Vitals:   01/20/16 0500 01/20/16 1002  BP: (!) 120/91 128/82  Pulse: (!) 106 (!) 105  Resp: 16 16  Temp: 98.1 F (36.7 C) 97.9 F (36.6 C)   Vitals:   01/19/16 1817 01/19/16 2249 01/20/16 0500 01/20/16 1002  BP: 120/76 121/79 (!) 120/91 128/82  Pulse: (!) 104 (!) 101 (!) 106 (!) 105  Resp: 16 17 16 16   Temp: 98.9 F (37.2 C) 98.1 F (36.7 C) 98.1 F (36.7 C) 97.9 F (36.6 C)  TempSrc: Oral Oral Oral Oral  SpO2: 98% 99% 97% 96%  Weight:  102.1 kg (225 lb)    Height:  5\' 7"  (1.702 m)      General exam: Appears calm and comfortable.  Respiratory system: Clear to auscultation. Respiratory effort normal. Cardiovascular system: S1 & S2 heard, RRR. No JVD,  rubs, gallops or clicks. No murmurs. Gastrointestinal system: Abdomen is nondistended, soft and nontender. No organomegaly or masses felt. Normal bowel sounds heard. Central nervous system: Alert and oriented. No focal neurological deficits. Extremities: No clubbing,  or cyanosis. No edema. Skin: No rashes, lesions or ulcers. Psychiatry: Judgement and insight appear Impaired. Mood & affect appropriate.  The results of significant diagnostics from this hospitalization (including imaging, microbiology, ancillary and laboratory) are listed below for reference.     Procedures and Diagnostic Studies:   Dg Chest 2 View  Result Date: 01/18/2016 CLINICAL DATA:  Cough and fever to 103 degrees for the  past several days. History of substance abuse. EXAM: CHEST  2 VIEW COMPARISON:  PA and lateral chest x-ray of April 13, 2014 FINDINGS: The lungs are adequately inflated. The interstitial markings are coarse. There is no alveolar infiltrate. There is no pleural effusion. The heart and pulmonary vascularity are normal. The mediastinum is normal in width. There is calcification in the wall of the aortic arch. The bony thorax is unremarkable. IMPRESSION: Mild interstitial prominence more conspicuous than in the past may reflect acute bronchitis or early interstitial pneumonia. There is no alveolar pneumonia nor CHF. Electronically Signed   By: David  Martinique M.D.   On: 01/18/2016 13:18     Labs:   Basic Metabolic Panel:  Recent Labs Lab 01/18/16 1258 01/18/16 1834 01/19/16 0606  NA 129* 135 133*  K 5.5* 4.1 4.8  CL 91* 98* 98*  CO2 28 27 25   GLUCOSE 414* 188* 224*  BUN 14 14 11   CREATININE 1.67* 1.50* 1.32*  CALCIUM 9.3 9.2 8.5*   GFR Estimated Creatinine Clearance: 76.2 mL/min (by C-G formula based on SCr of 1.32 mg/dL (H)). Liver Function Tests:  Recent Labs Lab 01/18/16 1258 01/19/16 0606  AST 27 20  ALT 21 18  ALKPHOS 48 38  BILITOT 0.7 0.4  PROT 6.8 6.2*  ALBUMIN 3.4* 2.9*    CBC:  Recent Labs Lab 01/18/16 1258 01/18/16 1834  WBC 9.9 7.0  NEUTROABS 7.0  --   HGB 16.0 16.2  HCT 46.5 47.2  MCV 88.1 88.6  PLT 253 229   CBG:  Recent Labs Lab 01/19/16 1206 01/19/16 1703 01/19/16 2256 01/20/16 0757 01/20/16 1132  GLUCAP 307* 115* 171* 178* 131*   Microbiology Recent Results (from the past 240 hour(s))  Culture, blood (routine x 2) Call MD if unable to obtain prior to antibiotics being given     Status: None (Preliminary result)   Collection Time: 01/18/16  6:45 PM  Result Value Ref Range Status   Specimen Description BLOOD LEFT ANTECUBITAL  Final   Special Requests   Final    BOTTLES DRAWN AEROBIC AND ANAEROBIC 10CC AER,5CC ANA   Culture NO GROWTH < 24  HOURS  Final   Report Status PENDING  Incomplete  Culture, blood (routine x 2) Call MD if unable to obtain prior to antibiotics being given     Status: None (Preliminary result)   Collection Time: 01/18/16  6:45 PM  Result Value Ref Range Status   Specimen Description BLOOD LEFT HAND  Final   Special Requests BOTTLES DRAWN AEROBIC ONLY 10CC  Final   Culture NO GROWTH < 24 HOURS  Final   Report Status PENDING  Incomplete     Discharge Instructions:   Discharge Instructions    Call MD for:  difficulty breathing, headache or visual disturbances    Complete by:  As directed    Call MD for:  extreme fatigue    Complete by:  As directed    Call MD for:  temperature >100.4    Complete by:  As directed    Diet - low sodium heart healthy    Complete by:  As directed    Diet Carb Modified    Complete by:  As  directed    Increase activity slowly    Complete by:  As directed        Medication List    STOP taking these medications   glipiZIDE 10 MG tablet Commonly known as:  GLUCOTROL   hydrOXYzine 25 MG tablet Commonly known as:  ATARAX/VISTARIL   lisinopril 10 MG tablet Commonly known as:  PRINIVIL,ZESTRIL   loratadine 10 MG tablet Commonly known as:  CLARITIN   metFORMIN 500 MG tablet Commonly known as:  GLUCOPHAGE   sertraline 50 MG tablet Commonly known as:  ZOLOFT   simvastatin 10 MG tablet Commonly known as:  ZOCOR   traZODone 50 MG tablet Commonly known as:  DESYREL     TAKE these medications   amLODipine 5 MG tablet Commonly known as:  NORVASC Take 1 tablet (5 mg total) by mouth daily.   doxycycline 100 MG capsule Commonly known as:  VIBRAMYCIN Take 1 capsule (100 mg total) by mouth 2 (two) times daily.   ibuprofen 200 MG tablet Commonly known as:  ADVIL,MOTRIN Take 200-400 mg by mouth every 6 (six) hours as needed (pain).   insulin NPH-regular Human (70-30) 100 UNIT/ML injection Commonly known as:  NOVOLIN 70/30 Inject 7-9 Units into the skin See  admin instructions. Inject 9 units subcutaneously with breakfast and 7 units with supper What changed:  how much to take  when to take this  additional instructions   Insulin Syringe-Needle U-100 31G X 15/64" 1 ML Misc To inject insulin daily   oseltamivir 75 MG capsule Commonly known as:  TAMIFLU Take 1 capsule (75 mg total) by mouth 2 (two) times daily.      Follow-up Information    [place]. Schedule an appointment as soon as possible for a visit in 1 month(s).   Why:  To get established with a primary care doctor. Contact information: [address] [place] [phone]           Time coordinating discharge: 35 minutes.  Signed:  RAMA,CHRISTINA  Pager [phone] [facility] 01/20/2016, 2:59 PM

## 2016-01-20 NOTE — Progress Notes (Signed)
Discharge instructions and medications discussed with patient.  Prescriptions and match letter given to patient.  All questions answered.

## 2016-01-20 NOTE — Progress Notes (Signed)
Inpatient Diabetes Program Recommendations  AACE/ADA: New Consensus Statement on Inpatient Glycemic Control (2015)  Target Ranges:  Prepandial:   less than 140 mg/dL      Peak postprandial:   less than 180 mg/dL (1-2 hours)      Critically ill patients:  140 - 180 mg/dL   Lab Results  Component Value Date   GLUCAP 178 (H) 01/20/2016   HGBA1C 13.1 (H) 09/21/2015    Review of Glycemic Control  Results for JAJUAN, EWALT (MRN [id number]) as of 01/20/2016 09:33  Ref. Range 01/19/2016 08:21 01/19/2016 12:06 01/19/2016 17:03 01/19/2016 22:56 01/20/2016 07:57  Glucose-Capillary Latest Ref Range: 65 - 99 mg/dL 202 (H) 307 (H) 115 (H) 171 (H) 178 (H)    Diabetes history: Type 2 Outpatient Diabetes medications: Novolin 70/30 9 units with breakfast and 7 units with supper, Glipizide 10mg  bid, Glucophage 500mg  qday  Current orders for Inpatient glycemic control: Novolog 70/30 7 units bid, Novolog 0-15 units tid  Inpatient Diabetes Program Recommendations:   Agree with current medications for blood sugar management.   Gentry Fitz, RN, BA, MHA, CDE Diabetes Coordinator Inpatient Diabetes Program  [phone] (Team Pager) [phone] ([place]) 01/20/2016 9:39 AM

## 2016-01-20 NOTE — Care Management Note (Addendum)
Case Management Note  Patient Details  Name: Jonathan Austin MRN: [id number] Date of Birth: 1965-04-21  Subjective/Objective:      CM following for progression and d/c planning.               Action/Plan: 01/20/2016 MATCH letter provided with override approval as pt received MATCH in August 2017, however override approved due to Flu A and Pneumonia.  Pt given info and phone number for [facility].  Pt reports that he has been going to [facility] for health services and that they will assist him in obtaining an [employer]. Pt was informed that he can call [facility] for an appointment .  Expected Discharge Date:    01/20/2016              Expected Discharge Plan:  Home/Self Care  In-House Referral:  NA  Discharge planning Services  CM Consult, [place] Program  Post Acute Care Choice:  NA Choice offered to:  NA  DME Arranged:  N/A DME Agency:  NA  HH Arranged:    [place] Agency:  NA  Status of Service:  Completed, signed off  If discussed at [place] of Stay Meetings, dates discussed:    Additional Comments:  Adron Bene, RN 01/20/2016, 1:06 PM

## 2016-01-23 LAB — CULTURE, BLOOD (ROUTINE X 2)
CULTURE: NO GROWTH
Culture: NO GROWTH

## 2016-02-19 ENCOUNTER — Encounter (HOSPITAL_COMMUNITY): Payer: Self-pay

## 2016-02-19 ENCOUNTER — Emergency Department (HOSPITAL_COMMUNITY)
Admission: EM | Admit: 2016-02-19 | Discharge: 2016-02-21 | Disposition: A | Discharge status: Other Acute Inpatient Hospital | Payer: 59 | Attending: Emergency Medicine | Admitting: Emergency Medicine

## 2016-02-19 DIAGNOSIS — R739 Hyperglycemia, unspecified: Secondary | ICD-10-CM

## 2016-02-19 DIAGNOSIS — N182 Chronic kidney disease, stage 2 (mild): Secondary | ICD-10-CM | POA: Insufficient documentation

## 2016-02-19 DIAGNOSIS — E1165 Type 2 diabetes mellitus with hyperglycemia: Secondary | ICD-10-CM | POA: Insufficient documentation

## 2016-02-19 DIAGNOSIS — Z79899 Other long term (current) drug therapy: Secondary | ICD-10-CM | POA: Insufficient documentation

## 2016-02-19 DIAGNOSIS — Z794 Long term (current) use of insulin: Secondary | ICD-10-CM | POA: Insufficient documentation

## 2016-02-19 DIAGNOSIS — I129 Hypertensive chronic kidney disease with stage 1 through stage 4 chronic kidney disease, or unspecified chronic kidney disease: Secondary | ICD-10-CM | POA: Insufficient documentation

## 2016-02-19 DIAGNOSIS — R45851 Suicidal ideations: Secondary | ICD-10-CM | POA: Insufficient documentation

## 2016-02-19 HISTORY — DX: Pure hypercholesterolemia, unspecified: E78.00

## 2016-02-19 HISTORY — DX: Essential (primary) hypertension: I10

## 2016-02-19 LAB — CBC
HCT: 45.1 % (ref 39.0–52.0)
HEMOGLOBIN: 15.8 g/dL (ref 13.0–17.0)
MCH: 30.4 pg (ref 26.0–34.0)
MCHC: 35 g/dL (ref 30.0–36.0)
MCV: 86.9 fL (ref 78.0–100.0)
Platelets: 333 10*3/uL (ref 150–400)
RBC: 5.19 MIL/uL (ref 4.22–5.81)
RDW: 12.7 % (ref 11.5–15.5)
WBC: 7.3 10*3/uL (ref 4.0–10.5)

## 2016-02-19 LAB — RAPID URINE DRUG SCREEN, HOSP PERFORMED
Amphetamines: NOT DETECTED
Barbiturates: NOT DETECTED
Benzodiazepines: NOT DETECTED
COCAINE: POSITIVE — AB
OPIATES: NOT DETECTED
TETRAHYDROCANNABINOL: NOT DETECTED

## 2016-02-19 LAB — COMPREHENSIVE METABOLIC PANEL
ALK PHOS: 68 U/L (ref 38–126)
ALT: 32 U/L (ref 17–63)
ANION GAP: 13 (ref 5–15)
AST: 25 U/L (ref 15–41)
Albumin: 4.4 g/dL (ref 3.5–5.0)
BILIRUBIN TOTAL: 0.4 mg/dL (ref 0.3–1.2)
BUN: 12 mg/dL (ref 6–20)
CALCIUM: 9.6 mg/dL (ref 8.9–10.3)
CO2: 23 mmol/L (ref 22–32)
CREATININE: 1.36 mg/dL — AB (ref 0.61–1.24)
Chloride: 94 mmol/L — ABNORMAL LOW (ref 101–111)
GFR, EST NON AFRICAN AMERICAN: 59 mL/min — AB (ref >60)
Glucose, Bld: 502 mg/dL (ref 65–99)
Potassium: 4.3 mmol/L (ref 3.5–5.1)
SODIUM: 130 mmol/L — AB (ref 135–145)
TOTAL PROTEIN: 7.8 g/dL (ref 6.5–8.1)

## 2016-02-19 LAB — CBG MONITORING, ED: Glucose-Capillary: 387 mg/dL — ABNORMAL HIGH (ref 65–99)

## 2016-02-19 LAB — ETHANOL: ALCOHOL ETHYL (B): 7 mg/dL — AB (ref <5)

## 2016-02-19 LAB — SALICYLATE LEVEL

## 2016-02-19 LAB — ACETAMINOPHEN LEVEL

## 2016-02-19 MED ORDER — INSULIN ASPART PROT & ASPART (70-30 MIX) 100 UNIT/ML ~~LOC~~ SUSP
7.0000 [IU] | Freq: Once | SUBCUTANEOUS | Status: AC
  Administered 2016-02-20: 7 [IU] via SUBCUTANEOUS
  Filled 2016-02-19: qty 10

## 2016-02-19 MED ORDER — INSULIN ASPART 100 UNIT/ML ~~LOC~~ SOLN: 10.0000 [IU] | Freq: Once | SUBCUTANEOUS | Status: DC

## 2016-02-19 MED ORDER — AMLODIPINE BESYLATE 5 MG PO TABS
5.0000 mg | ORAL_TABLET | Freq: Every day | ORAL | Status: DC
  Administered 2016-02-20: 5 mg via ORAL
  Filled 2016-02-19: qty 1

## 2016-02-19 MED ORDER — SODIUM CHLORIDE 0.9 % IV BOLUS (SEPSIS): 2000.0000 mL | Freq: Once | INTRAVENOUS | Status: DC

## 2016-02-19 MED ORDER — INSULIN ASPART PROT & ASPART (70-30 MIX) 100 UNIT/ML ~~LOC~~ SUSP
7.0000 [IU] | Freq: Every day | SUBCUTANEOUS | Status: DC
  Administered 2016-02-20: 7 [IU] via SUBCUTANEOUS

## 2016-02-19 NOTE — ED Provider Notes (Signed)
[facility] DEPT Provider Note   CSN: [id number] Arrival date & time: 02/19/16  1749     History   Chief Complaint Chief Complaint  Patient presents with  . Suicidal    HPI Jonathan Austin is a 51 y.o. male.  HPI  51 y.o. male with a hx of HTN, DM, Depression, presents to the Emergency Department today complaining of suicidal ideation with plan of cutting his wrists. Notes ETOH intake of two six packs of beer and some cocaine use x 1 hour ago. Pt states this is due to trouble keeping a job, marital issues, as well as recent scare for possible pancreatic cancer. Denies other symptoms at this time. No CP/SOB/ABD pain. No N/V/D. No headaches. No vision changes. No other symptoms noted,    Past Medical History:  Diagnosis Date  . Cellulitis 06/28/2013  . Cocaine abuse 06/28/2013  . Cocaine abuse with cocaine-induced mood disorder ([place])   . Depression   . Diabetes mellitus   . High cholesterol   . Hypertension   . Scrotal infection 06/28/2013  . Sleep apnea   . Suicidal ideations     Patient Active Problem List   Diagnosis Date Noted  . Sepsis ([place]) secondary to CAP 01/20/2016  . Influenza A 01/20/2016  . AKI (acute kidney injury) ([place]) 01/20/2016  . CKD (chronic kidney disease), stage II 01/20/2016  . Hyperkalemia 01/20/2016  . CAP (community acquired pneumonia) 01/18/2016  . Hypotension 01/18/2016  . Hyperglycemia 09/20/2015  . HTN (hypertension) 09/20/2015  . HLD (hyperlipidemia) 09/20/2015  . Substance abuse 08/30/2014  . Substance induced mood disorder ([place]) 05/07/2014  . Cocaine abuse with cocaine-induced mood disorder ([place])   . Alcohol abuse   . Fever 02/12/2014  . Sinus tachycardia 02/12/2014  . Marijuana abuse 06/28/2013  . Leukocytosis 06/28/2013  . Hyponatremia 06/28/2013  . Diabetes mellitus type II, uncontrolled ([place]) 12/09/2012    Past Surgical History:  Procedure Laterality Date  . TONSILLECTOMY         Home Medications    Prior to  Admission medications   Medication Sig Start Date End Date Taking? Authorizing Provider  amLODipine (NORVASC) 5 MG tablet Take 1 tablet (5 mg total) by mouth daily. 09/03/14  Yes Benjamine Mola, FNP  Cholecalciferol (VITAMIN D PO) Take 1 tablet by mouth daily.   Yes Historical Provider, MD  insulin NPH-regular Human (NOVOLIN 70/30) (70-30) 100 UNIT/ML injection Inject 7-9 Units into the skin See admin instructions. Inject 9 units subcutaneously with breakfast and 7 units with supper 01/20/16  Yes Christina P Rama, MD  SIMVASTATIN PO Take 1 tablet by mouth daily.   Yes Historical Provider, MD  doxycycline (VIBRAMYCIN) 100 MG capsule Take 1 capsule (100 mg total) by mouth 2 (two) times daily. Patient not taking: Reported on 02/19/2016 01/20/16   Venetia Maxon Rama, MD  Insulin Syringe-Needle U-100 31G X 15/64" 1 ML MISC To inject insulin daily 09/21/15   Ophelia Shoulder, MD  oseltamivir (TAMIFLU) 75 MG capsule Take 1 capsule (75 mg total) by mouth 2 (two) times daily. Patient not taking: Reported on 02/19/2016 01/20/16   Venetia Maxon Rama, MD    Family History Family History  Problem Relation Age of Onset  . Hypertension Mother   . Hypertension Father   . Diabetes Father     Social History Social History  Substance Use Topics  . Smoking status: Never Smoker  . Smokeless tobacco: Never Used  . Alcohol use 1.8 oz/week    1 Glasses of  wine, 1 Cans of beer, 1 Shots of liquor per week     Comment: Occasionally     Allergies   Patient has no known allergies.   Review of Systems Review of Systems ROS reviewed and all are negative for acute change except as noted in the HPI.  Physical Exam Updated Vital Signs BP 144/92 (BP Location: Right Arm)   Pulse 92   Temp 98.5 F (36.9 C) (Oral)   Resp 18   Ht 5\' 7"  (1.702 m)   Wt 102.1 kg   SpO2 96%   BMI 35.24 kg/m   Physical Exam  Constitutional: He is oriented to person, place, and time. Vital signs are normal. He appears well-developed  and well-nourished.  HENT:  Head: Normocephalic.  Right Ear: Hearing normal.  Left Ear: Hearing normal.  Eyes: Conjunctivae and EOM are normal. Pupils are equal, round, and reactive to light.  Neck: Normal range of motion. Neck supple.  Cardiovascular: Normal rate, regular rhythm, normal heart sounds and intact distal pulses.   Pulmonary/Chest: Effort normal and breath sounds normal.  Abdominal: Soft. There is no tenderness.  Musculoskeletal: Normal range of motion.  Neurological: He is alert and oriented to person, place, and time.  Skin: Skin is warm and dry.  Psychiatric: He has a normal mood and affect. His speech is normal and behavior is normal. Thought content normal.  Nursing note and vitals reviewed.  ED Treatments / Results  Labs (all labs ordered are listed, but only abnormal results are displayed) Labs Reviewed  COMPREHENSIVE METABOLIC PANEL - Abnormal; Notable for the following:       Result Value   Sodium 130 (*)    Chloride 94 (*)    Glucose, Bld 502 (*)    Creatinine, Ser 1.36 (*)    GFR calc non Af Amer 59 (*)    All other components within normal limits  ETHANOL - Abnormal; Notable for the following:    Alcohol, Ethyl (B) 7 (*)    All other components within normal limits  ACETAMINOPHEN LEVEL - Abnormal; Notable for the following:    Acetaminophen (Tylenol), Serum <10 (*)    All other components within normal limits  RAPID URINE DRUG SCREEN, HOSP PERFORMED - Abnormal; Notable for the following:    Cocaine POSITIVE (*)    All other components within normal limits  SALICYLATE LEVEL  CBC    EKG  EKG Interpretation None       Radiology No results found.  Procedures Procedures (including critical care time)  Medications Ordered in ED Medications - No data to display   Initial Impression / Assessment and Plan / ED Course  I have reviewed the triage vital signs and the nursing notes.  Pertinent labs & imaging results that were available during  my care of the patient were reviewed by me and considered in my medical decision making (see chart for details).  Clinical Course    Final Clinical Impressions(s) / ED Diagnoses  {I have reviewed and evaluated the relevant laboratory values.   {I have reviewed the relevant previous healthcare records.  {I obtained HPI from historian.   ED Course:  Assessment: Pt is a 6yM with hx HTN, DM, Depression who presents with SI with plan to cut wrists. Notes stress at home. On exam, pt in NAD. Nontoxic/nonseptic appearing. VSS. Afebrile. Lungs CTA. Heart RRR. Abdomen nontender soft. POC. CBC with Glucose 502. Potassium 4.3. Gap 13. Given SQ 70/30 Insulin. Repeat glucose in 300s. No evidence  of DKA. Pt medically cleared for evaluation. Plan is to Consult TTS for further evaluation.   Disposition/Plan:  TTS consult Pt acknowledges and agrees with plan  Supervising Physician Daleen Bo, MD  Final diagnoses:  Suicidal ideation  Hyperglycemia    New Prescriptions New Prescriptions   No medications on file     Shary Decamp, PA-C 02/19/16 [place], MD 02/20/16 1319

## 2016-02-19 NOTE — ED Triage Notes (Addendum)
Per Pt, Pt reports having "trouuble at home and dealing with a lot." Pt is tearful at triage and reports having suicidal thoughts with plan. Reports drinking two six packs of beer and having some cocaine use approximately one hour ago. Pt is married, but reports they have "been having some problems. I am trying to keep a job. I recently may have been diagnosed with prostate cancer. I just hit rock bottom today." Denies Hx of the same.

## 2016-02-20 LAB — CBG MONITORING, ED
GLUCOSE-CAPILLARY: 290 mg/dL — AB (ref 65–99)
GLUCOSE-CAPILLARY: 285 mg/dL — AB (ref 65–99)
Glucose-Capillary: 295 mg/dL — ABNORMAL HIGH (ref 65–99)
Glucose-Capillary: 252 mg/dL — ABNORMAL HIGH (ref 65–99)
Glucose-Capillary: 339 mg/dL — ABNORMAL HIGH (ref 65–99)

## 2016-02-20 MED ORDER — INSULIN ASPART PROT & ASPART (70-30 MIX) 100 UNIT/ML ~~LOC~~ SUSP
7.0000 [IU] | Freq: Two times a day (BID) | SUBCUTANEOUS | Status: DC
  Administered 2016-02-20: 7 [IU] via SUBCUTANEOUS

## 2016-02-20 MED ORDER — INSULIN ASPART 100 UNIT/ML ~~LOC~~ SOLN
0.0000 [IU] | Freq: Three times a day (TID) | SUBCUTANEOUS | Status: DC
  Administered 2016-02-20: 8 [IU] via SUBCUTANEOUS
  Administered 2016-02-20: 11 [IU] via SUBCUTANEOUS
  Filled 2016-02-20: qty 1

## 2016-02-20 NOTE — ED Notes (Signed)
Pt on phone at nurses' desk letting spouse know about visitation times.

## 2016-02-20 NOTE — ED Provider Notes (Signed)
11:25 PM patient is alert ambulate without difficulty. Not lightheaded on standing pleasant and cooperative. Stable for transfer to [facility] Dr Parke Poisson is accepting physician. Results for orders placed or performed during the hospital encounter of 02/19/16  Comprehensive metabolic panel  Result Value Ref Range   Sodium 130 (L) 135 - 145 mmol/L   Potassium 4.3 3.5 - 5.1 mmol/L   Chloride 94 (L) 101 - 111 mmol/L   CO2 23 22 - 32 mmol/L   Glucose, Bld 502 (HH) 65 - 99 mg/dL   BUN 12 6 - 20 mg/dL   Creatinine, Ser 1.36 (H) 0.61 - 1.24 mg/dL   Calcium 9.6 8.9 - 10.3 mg/dL   Total Protein 7.8 6.5 - 8.1 g/dL   Albumin 4.4 3.5 - 5.0 g/dL   AST 25 15 - 41 U/L   ALT 32 17 - 63 U/L   Alkaline Phosphatase 68 38 - 126 U/L   Total Bilirubin 0.4 0.3 - 1.2 mg/dL   GFR calc non Af Amer 59 (L) >60 mL/min   GFR calc Af Amer >60 >60 mL/min   Anion gap 13 5 - 15  Ethanol  Result Value Ref Range   Alcohol, Ethyl (B) 7 (H) <5 mg/dL  Salicylate level  Result Value Ref Range   Salicylate Lvl [id number] 2.8 - 30.0 mg/dL  Acetaminophen level  Result Value Ref Range   Acetaminophen (Tylenol), Serum <10 (L) 10 - 30 ug/mL  cbc  Result Value Ref Range   WBC 7.3 4.0 - 10.5 K/uL   RBC 5.19 4.22 - 5.81 MIL/uL   Hemoglobin 15.8 13.0 - 17.0 g/dL   HCT 45.1 39.0 - 52.0 %   MCV 86.9 78.0 - 100.0 fL   MCH 30.4 26.0 - 34.0 pg   MCHC 35.0 30.0 - 36.0 g/dL   RDW 12.7 11.5 - 15.5 %   Platelets 333 150 - 400 K/uL  Rapid urine drug screen (hospital performed)  Result Value Ref Range   Opiates NONE DETECTED NONE DETECTED   Cocaine POSITIVE (A) NONE DETECTED   Benzodiazepines NONE DETECTED NONE DETECTED   Amphetamines NONE DETECTED NONE DETECTED   Tetrahydrocannabinol NONE DETECTED NONE DETECTED   Barbiturates NONE DETECTED NONE DETECTED  CBG monitoring, ED  Result Value Ref Range   Glucose-Capillary 387 (H) 65 - 99 mg/dL  CBG monitoring, ED  Result Value Ref Range   Glucose-Capillary 295 (H) 65 - 99 mg/dL  CBG  monitoring, ED  Result Value Ref Range   Glucose-Capillary 252 (H) 65 - 99 mg/dL  CBG monitoring, ED  Result Value Ref Range   Glucose-Capillary 339 (H) 65 - 99 mg/dL   Comment 1 Notify RN    Comment 2 Document in Chart   CBG monitoring, ED  Result Value Ref Range   Glucose-Capillary 290 (H) 65 - 99 mg/dL   Comment 1 Document in Chart   CBG monitoring, ED  Result Value Ref Range   Glucose-Capillary 285 (H) 65 - 99 mg/dL   No results found.   Orlie Dakin, MD 02/20/16 [phone]

## 2016-02-20 NOTE — ED Notes (Signed)
Report called and given to [facility]

## 2016-02-20 NOTE — ED Notes (Signed)
Pt's spouse called and requested to speak w/pt - advised her RN will advise him she called when he wakes up.

## 2016-02-20 NOTE — Progress Notes (Signed)
Patient was referred to the following inpatient treatment facilities: [place], [place], [place], [place], [place]. [facility] reviewing referral.  CSW will continue to follow up with placement efforts.  Jonathan Austin, [place] Disposition staff 02/20/2016 5:00 PM

## 2016-02-20 NOTE — ED Notes (Addendum)
Pt's black flip flops removed from room and placed in his belongings bag. Pt aware. Pt verbalized understanding and signed Medical Clearance Pt policy paperwork. Copy given to pt and copy placed on clipboard.

## 2016-02-20 NOTE — [facility] Counselor (Signed)
Reassessment note TTS:  Pt states that he slept well last night and feels a little better but is still depressed and does not feel safe to go home. He states that he has multiple stressors including loosing his son and step son in the past 3 years. He also has financial issues and has been drinking heavily. Pt affect is blunted and depressed. He denies any AVH or homicidal ideations. Pt still meets inpatient criteria per [employer] NP.  Bedelia Person LPC, LCASA

## 2016-02-20 NOTE — ED Notes (Signed)
Pt on phone at nurses' desk talking w/his spouse. Advised him to ask her to bring his CPAP machine d/t has been accepted to [facility] and will be transported this evening.

## 2016-02-20 NOTE — ED Notes (Signed)
Pt given a diet ginger ale 

## 2016-02-20 NOTE — ED Notes (Signed)
Eating breakfast 

## 2016-02-20 NOTE — ED Notes (Signed)
Dr Oleta Mouse aware of CBG 339 - advised she will order sliding scale.

## 2016-02-20 NOTE — ED Notes (Signed)
BH called and told this RN that pt meets criteria but they cannot take him at this time b/c his CBG is >350.  [place] informed ED-P and pt CBG will be monitored.

## 2016-02-20 NOTE — ED Notes (Signed)
Breakfast ordered 

## 2016-02-20 NOTE — ED Notes (Signed)
Spouse leaving at this time.  

## 2016-02-20 NOTE — BH Assessment (Addendum)
Tele Assessment Note   Jonathan Austin is an 51 y.o. married male who presents unaccompanied to [facility] ED reporting symptoms of depression including suicidal ideation. Pt has a history of depression and substance abuse and says he has not been taking psychiatric medication and has been using crack and alcohol. Pt reports symptoms including crying spells, social withdrawal, loss of interest in usual pleasures, fatigue, irritability, decreased concentration, decreased sleep, decreased appetite and feelings of guilt and hopelessness. He report current suicidal ideation with plan to cut his wrists with a knife. Pt denies any history of previous suicide attempts. He denies current homicidal ideation but says he was arrest for assault at age 89. He denies any history of psychotic symptoms.  Pt reports he is smoking approximately one gram of crack daily. He reports drinking one 40-ounce beer approximately three times per week and drank two six-packs of beer today. Pt denies other substance use. Pt's urine drug screen is positive for cocaine and blood alcohol is seven. Pt reports his longest period of sobriety is eight years.   Pt identifies several stressors. He report he has been unemployed for two months and has bills he cannot pay. He and his wife are having marital problems. Pt reports he had a recent scare for possible pancreatic cancer. He says his son and stepson died a couple of years ago and he has difficulty this time of the year. Pt reports he is currently on probation for obtaining property under false pretenses. Pt identifies his wife and mother as his primary supports.  Pt reports he currently has no mental health providers. He has not taken psychiatric medications for months. He has received inpatient psychiatric treatment at [facility] in the past and his last admission was in July 2016.  Pt is dressed in hospital scrubs, drowsy, oriented x4 with normal speech and normal motor behavior. Eye  contact is good. Pt's mood is depressed and affect is congruent with mood. Thought process is coherent and relevant. There is no indication Pt is currently responding to internal stimuli or experiencing delusional thought content. Pt was calm and cooperative throughout assessment. He is requesting inpatient psychiatric treatment.     Diagnosis: Major Depressive Disorder, Recurrent, Severe Without Psychotic Features; Cocaine Use Disorder, Severe; Alcohol Use Disorder, Mild  Past Medical History:  Past Medical History:  Diagnosis Date  . Cellulitis 06/28/2013  . Cocaine abuse 06/28/2013  . Cocaine abuse with cocaine-induced mood disorder ([place])   . Depression   . Diabetes mellitus   . High cholesterol   . Hypertension   . Scrotal infection 06/28/2013  . Sleep apnea   . Suicidal ideations     Past Surgical History:  Procedure Laterality Date  . TONSILLECTOMY      Family History:  Family History  Problem Relation Age of Onset  . Hypertension Mother   . Hypertension Father   . Diabetes Father     Social History:  reports that he has never smoked. He has never used smokeless tobacco. He reports that he drinks about 1.8 oz of alcohol per week . He reports that he uses drugs, including Cocaine and Marijuana.  Additional Social History:  Alcohol / Drug Use Pain Medications: denies abuse Prescriptions: see MAR Over the Counter: See MAR History of alcohol / drug use?: Yes Longest period of sobriety (when/how long): 8 years Negative Consequences of Use: Financial, Personal relationships, Work / School Withdrawal Symptoms: Sweats, Tremors Substance #1 Name of Substance 1: Cocaine (crack) 1 -  Age of First Use: 30 1 - Amount (size/oz): Approximately one gram 1 - Frequency: Daily 1 - Duration: Ongoing 1 - Last Use / Amount: 02/19/16 Substance #2 Name of Substance 2: Alcohol 2 - Age of First Use: 17 2 - Amount (size/oz): One 40-ounce beer 2 - Frequency: Approximately three times per  week 2 - Duration: Ongoing 2 - Last Use / Amount: 02/19/16  CIWA: CIWA-Ar BP: 144/92 Pulse Rate: 92 COWS:    PATIENT STRENGTHS: (choose at least two) Ability for insight Average or above average intelligence Capable of independent living Communication skills General fund of knowledge Motivation for treatment/growth Physical Health Supportive family/friends Work skills  Allergies: No Known Allergies  Home Medications:  (Not in a hospital admission)  OB/GYN Status:  No LMP for male patient.  General Assessment Data Location of Assessment: [facility] ED TTS Assessment: In system Is this a Tele or Face-to-Face Assessment?: Tele Assessment Is this an Initial Assessment or a Re-assessment for this encounter?: Initial Assessment Marital status: Married [place] name: NA Is patient pregnant?: No Pregnancy Status: No Living Arrangements: Spouse/significant other Can pt return to current living arrangement?: Yes Admission Status: Voluntary Is patient capable of signing voluntary admission?: Yes Referral Source: Self/Family/Friend Insurance type: Self-pay     Crisis Care Plan Living Arrangements: Spouse/significant other Legal Guardian: Other: (Self) Name of Psychiatrist: None Name of Therapist: None  Education Status Is patient currently in school?: No Current Grade: NA Highest grade of school patient has completed: GED Name of school: NA Contact person: NA  Risk to self with the past 6 months Suicidal Ideation: Yes-Currently Present Has patient been a risk to self within the past 6 months prior to admission? : Yes Suicidal Intent: Yes-Currently Present Has patient had any suicidal intent within the past 6 months prior to admission? : Yes Is patient at risk for suicide?: Yes Suicidal Plan?: Yes-Currently Present Has patient had any suicidal plan within the past 6 months prior to admission? : Yes Specify Current Suicidal Plan: Plan to cut wrists with knife Access to  Means: Yes Specify Access to Suicidal Means: Access to knives at home What has been your use of drugs/alcohol within the last 12 months?: Pt is using cocaine daily and alcohol three times per week Previous Attempts/Gestures: No How many times?: 0 Other Self Harm Risks: None Triggers for Past Attempts: None known Intentional Self Injurious Behavior: None Family Suicide History: Yes (Maternal uncle completed suicide) Recent stressful life event(s): Job Loss, Museum/gallery curator Problems, Legal Issues, Loss (Comment) (Son and step-son died) Persecutory voices/beliefs?: No Depression: Yes Depression Symptoms: Despondent, Tearfulness, Fatigue, Guilt, Loss of interest in usual pleasures, Feeling worthless/self pity, Feeling angry/irritable Substance abuse history and/or treatment for substance abuse?: Yes Suicide prevention information given to non-admitted patients: Not applicable  Risk to Others within the past 6 months Homicidal Ideation: No Does patient have any lifetime risk of violence toward others beyond the six months prior to admission? : No Thoughts of Harm to Others: No Current Homicidal Intent: No Current Homicidal Plan: No Access to Homicidal Means: No Identified Victim: None History of harm to others?: No Assessment of Violence: In distant past Violent Behavior Description: Pt reports he was charged with assault at age 66 Does patient have access to weapons?: No Criminal Charges Pending?: No Does patient have a court date: No Is patient on probation?: Yes (Pt on probation for obtaining property under false pretenses)  Psychosis Hallucinations: None noted Delusions: None noted  Mental Status Report Appearance/Hygiene: In scrubs  Eye Contact: Good Motor Activity: Unremarkable Speech: Logical/coherent Level of Consciousness: Drowsy Mood: Depressed Affect: Depressed Anxiety Level: None Thought Processes: Coherent, Relevant Judgement: Unimpaired Orientation: Person, Place,  Time, Situation, Appropriate for developmental age Obsessive Compulsive Thoughts/Behaviors: None  Cognitive Functioning Concentration: Normal Memory: Recent Intact, Remote Intact IQ: Average Insight: Fair Impulse Control: Fair Appetite: Poor Weight Loss: 0 Weight Gain: 0 Sleep: Decreased Total Hours of Sleep: 5 Vegetative Symptoms: None  ADLScreening [facility] Assessment Services) Patient's cognitive ability adequate to safely complete daily activities?: Yes Patient able to express need for assistance with ADLs?: Yes Independently performs ADLs?: Yes (appropriate for developmental age)  Prior Inpatient Therapy Prior Inpatient Therapy: Yes Prior Therapy Dates: 08/2014, multiple admits Prior Therapy Facilty/Provider(s): [facility] Reason for Treatment: Depression, substance abuse  Prior Outpatient Therapy Prior Outpatient Therapy: Yes Prior Therapy Dates: 2016 Prior Therapy Facilty/Provider(s): Unknown Reason for Treatment: Depression Does patient have an ACCT team?: No Does patient have Intensive In-House Services?  : No Does patient have [facility] services? : No Does patient have P4CC services?: No  ADL Screening (condition at time of admission) Patient's cognitive ability adequate to safely complete daily activities?: Yes Is the patient deaf or have difficulty hearing?: No Does the patient have difficulty seeing, even when wearing glasses/contacts?: No Does the patient have difficulty concentrating, remembering, or making decisions?: No Patient able to express need for assistance with ADLs?: Yes Does the patient have difficulty dressing or bathing?: No Independently performs ADLs?: Yes (appropriate for developmental age) Does the patient have difficulty walking or climbing stairs?: No Weakness of Legs: None Weakness of Arms/Hands: None       Abuse/Neglect Assessment (Assessment to be complete while patient is alone) Physical Abuse: Denies Verbal Abuse: Denies Sexual  Abuse: Denies Exploitation of patient/patient's resources: Denies Self-Neglect: Denies     Regulatory affairs officer (For Healthcare) Does Patient Have a Medical Advance Directive?: No Would patient like information on creating a medical advance directive?: No - Patient declined    Additional Information 1:1 In Past 12 Months?: No CIRT Risk: No Elopement Risk: No Does patient have medical clearance?: No     Disposition: Gave clinical report to Lindon Romp, NP who said Pt meets criteria for inpatient psychiatric treatment when Pt is medically cleared. Pt's glucose is currently too elevated for transfer to [facility]. Per Lavell Luster, AC at [facility], Pt's glucose needs to be below 350 for 24 hours. Notified Shary Decamp, PA-C and Myriam Jacobson, RN of recommendation.  Disposition Initial Assessment Completed for this Encounter: Yes Disposition of Patient: Inpatient treatment program Type of inpatient treatment program: Adult   Evelena Peat, [facility], [facility], [facility] Triage Specialist [phone]   Evelena Peat 02/20/2016 12:20 AM

## 2016-02-20 NOTE — ED Notes (Addendum)
Spouse visiting w/pt Pt eating lunch - both aware [facility] may be able to accept pt when have beds available - however CBG needs to be lower. Voiced understanding.

## 2016-02-20 NOTE — ED Notes (Signed)
Pt voiced agreement w/tx plan - accepted to [facility] - signed consent forms - copy faxed to [facility] - copy sent to Medical Records - original placed in folder for [facility].

## 2016-02-20 NOTE — ED Notes (Signed)
Patient was given a snack and drink. A regular diet was ordered for Lunch.

## 2016-02-21 ENCOUNTER — Inpatient Hospital Stay (HOSPITAL_COMMUNITY)
Admission: AD | Admit: 2016-02-21 | Discharge: 2016-02-26 | DRG: 885 | Disposition: A | Discharge status: Home / Self Care | Payer: Federal, State, Local not specified - Other | Source: Intra-hospital | Attending: Psychiatry | Admitting: Psychiatry

## 2016-02-21 ENCOUNTER — Encounter (HOSPITAL_COMMUNITY): Payer: Self-pay | Admitting: Emergency Medicine

## 2016-02-21 DIAGNOSIS — F1010 Alcohol abuse, uncomplicated: Secondary | ICD-10-CM | POA: Diagnosis present

## 2016-02-21 DIAGNOSIS — N182 Chronic kidney disease, stage 2 (mild): Secondary | ICD-10-CM | POA: Diagnosis present

## 2016-02-21 DIAGNOSIS — E7800 Pure hypercholesterolemia, unspecified: Secondary | ICD-10-CM | POA: Diagnosis present

## 2016-02-21 DIAGNOSIS — G4730 Sleep apnea, unspecified: Secondary | ICD-10-CM | POA: Diagnosis present

## 2016-02-21 DIAGNOSIS — E1122 Type 2 diabetes mellitus with diabetic chronic kidney disease: Secondary | ICD-10-CM | POA: Diagnosis present

## 2016-02-21 DIAGNOSIS — F1420 Cocaine dependence, uncomplicated: Secondary | ICD-10-CM | POA: Diagnosis present

## 2016-02-21 DIAGNOSIS — R45851 Suicidal ideations: Secondary | ICD-10-CM | POA: Diagnosis present

## 2016-02-21 DIAGNOSIS — F419 Anxiety disorder, unspecified: Secondary | ICD-10-CM | POA: Diagnosis present

## 2016-02-21 DIAGNOSIS — Z8249 Family history of ischemic heart disease and other diseases of the circulatory system: Secondary | ICD-10-CM

## 2016-02-21 DIAGNOSIS — F1994 Other psychoactive substance use, unspecified with psychoactive substance-induced mood disorder: Secondary | ICD-10-CM | POA: Diagnosis present

## 2016-02-21 DIAGNOSIS — Z794 Long term (current) use of insulin: Secondary | ICD-10-CM | POA: Diagnosis not present

## 2016-02-21 DIAGNOSIS — Z833 Family history of diabetes mellitus: Secondary | ICD-10-CM

## 2016-02-21 DIAGNOSIS — Z79899 Other long term (current) drug therapy: Secondary | ICD-10-CM | POA: Diagnosis not present

## 2016-02-21 DIAGNOSIS — F332 Major depressive disorder, recurrent severe without psychotic features: Principal | ICD-10-CM | POA: Diagnosis present

## 2016-02-21 DIAGNOSIS — R000 Tachycardia, unspecified: Secondary | ICD-10-CM | POA: Diagnosis present

## 2016-02-21 DIAGNOSIS — I129 Hypertensive chronic kidney disease with stage 1 through stage 4 chronic kidney disease, or unspecified chronic kidney disease: Secondary | ICD-10-CM | POA: Diagnosis present

## 2016-02-21 DIAGNOSIS — Z9889 Other specified postprocedural states: Secondary | ICD-10-CM | POA: Diagnosis not present

## 2016-02-21 LAB — GLUCOSE, CAPILLARY
GLUCOSE-CAPILLARY: 354 mg/dL — AB (ref 65–99)
GLUCOSE-CAPILLARY: 325 mg/dL — AB (ref 65–99)
Glucose-Capillary: 286 mg/dL — ABNORMAL HIGH (ref 65–99)
Glucose-Capillary: 352 mg/dL — ABNORMAL HIGH (ref 65–99)

## 2016-02-21 MED ORDER — MAGNESIUM HYDROXIDE 400 MG/5ML PO SUSP: 30.0000 mL | Freq: Every day | ORAL | Status: DC | PRN

## 2016-02-21 MED ORDER — AMLODIPINE BESYLATE 5 MG PO TABS
5.0000 mg | ORAL_TABLET | Freq: Every day | ORAL | Status: DC
  Administered 2016-02-21 – 2016-02-26 (×6): 5 mg via ORAL
  Filled 2016-02-21: qty 7
  Filled 2016-02-21 (×7): qty 1

## 2016-02-21 MED ORDER — LORAZEPAM 1 MG PO TABS: 1.0000 mg | ORAL_TABLET | Freq: Four times a day (QID) | ORAL | Status: AC | PRN

## 2016-02-21 MED ORDER — INSULIN ASPART 100 UNIT/ML ~~LOC~~ SOLN
0.0000 [IU] | Freq: Three times a day (TID) | SUBCUTANEOUS | Status: DC
  Administered 2016-02-21 (×2): 15 [IU] via SUBCUTANEOUS
  Administered 2016-02-21: 8 [IU] via SUBCUTANEOUS
  Administered 2016-02-22: 11 [IU] via SUBCUTANEOUS
  Administered 2016-02-22: 8 [IU] via SUBCUTANEOUS
  Administered 2016-02-22: 5 [IU] via SUBCUTANEOUS

## 2016-02-21 MED ORDER — ALUM & MAG HYDROXIDE-SIMETH 200-200-20 MG/5ML PO SUSP: 30.0000 mL | ORAL | Status: DC | PRN

## 2016-02-21 MED ORDER — TRAZODONE HCL 50 MG PO TABS
50.0000 mg | ORAL_TABLET | Freq: Every evening | ORAL | Status: DC | PRN
  Administered 2016-02-22 – 2016-02-25 (×4): 50 mg via ORAL
  Filled 2016-02-21 (×7): qty 1
  Filled 2016-02-21: qty 14
  Filled 2016-02-21: qty 1
  Filled 2016-02-21: qty 14
  Filled 2016-02-21 (×5): qty 1

## 2016-02-21 MED ORDER — INSULIN ASPART PROT & ASPART (70-30 MIX) 100 UNIT/ML ~~LOC~~ SUSP
9.0000 [IU] | Freq: Every day | SUBCUTANEOUS | Status: DC
  Administered 2016-02-21 – 2016-02-22 (×2): 9 [IU] via SUBCUTANEOUS

## 2016-02-21 MED ORDER — LOPERAMIDE HCL 2 MG PO CAPS: 2.0000 mg | ORAL_CAPSULE | ORAL | Status: AC | PRN

## 2016-02-21 MED ORDER — HYDROXYZINE HCL 25 MG PO TABS
25.0000 mg | ORAL_TABLET | Freq: Four times a day (QID) | ORAL | Status: AC | PRN
  Administered 2016-02-22: 25 mg via ORAL
  Filled 2016-02-21: qty 1

## 2016-02-21 MED ORDER — INSULIN ASPART PROT & ASPART (70-30 MIX) 100 UNIT/ML ~~LOC~~ SUSP
7.0000 [IU] | Freq: Every day | SUBCUTANEOUS | Status: DC
  Administered 2016-02-21 – 2016-02-22 (×2): 7 [IU] via SUBCUTANEOUS

## 2016-02-21 MED ORDER — ONDANSETRON 4 MG PO TBDP: 4.0000 mg | ORAL_TABLET | Freq: Four times a day (QID) | ORAL | Status: AC | PRN

## 2016-02-21 MED ORDER — SERTRALINE HCL 25 MG PO TABS
25.0000 mg | ORAL_TABLET | Freq: Every day | ORAL | Status: DC
  Administered 2016-02-21 – 2016-02-25 (×5): 25 mg via ORAL
  Filled 2016-02-21 (×7): qty 1

## 2016-02-21 MED ORDER — VITAMIN D3 25 MCG (1000 UNIT) PO TABS
1000.0000 [IU] | ORAL_TABLET | Freq: Every day | ORAL | Status: DC
  Administered 2016-02-22 – 2016-02-26 (×5): 1000 [IU] via ORAL
  Filled 2016-02-21 (×7): qty 1

## 2016-02-21 MED ORDER — SIMVASTATIN 20 MG PO TABS
20.0000 mg | ORAL_TABLET | Freq: Every day | ORAL | Status: DC
  Administered 2016-02-21 – 2016-02-26 (×6): 20 mg via ORAL
  Filled 2016-02-21: qty 7
  Filled 2016-02-21 (×7): qty 1

## 2016-02-21 MED ORDER — ACETAMINOPHEN 325 MG PO TABS: 650.0000 mg | ORAL_TABLET | Freq: Four times a day (QID) | ORAL | Status: DC | PRN

## 2016-02-21 NOTE — Progress Notes (Addendum)
DAR NOTE: Pt present with flat affect and depressed mood in the unit. Pt has been in the day room interacting with peers. Pt denies physical pain, took all his meds as scheduled. As per self inventory, pt had a fair night sleep, good appetite, low energy, and good concentration. Pt rate depression at 8, hopeless ness at 8, and anxiety at 8. Pt's safety ensured with 15 minute and environmental checks. Pt currently denies SI/HI and A/V hallucinations. Pt verbally agrees to seek staff if SI/HI or A/VH occurs and to consult with staff before acting on these thoughts. Will continue POC.

## 2016-02-21 NOTE — Progress Notes (Signed)
D.  Pt pleasant on approach, denies complaints at this time.  Positive for evening AA group, observed interacting appropriately with peers on the unit.  Pt denies s/s of withdrawal at this time.  Pt denies SI/HI/hallucinations at this time.  A.  Support and encouragement offered, medication given as ordered.  R.  Pt remains safe on the unit, will continue to monitor.

## 2016-02-21 NOTE — Progress Notes (Signed)
Admission Note:  51 yr male who presents in no acute distress for the treatment of SI and Depression. Pt appears flat and depressed. Pt was calm and cooperative with admission process. Pt denies SI/HI at this time but contracts for safety upon admission. Pt denies AVH .  Patient states his stressors are unemployment, death of son, relationship problems. Patient wife and mother are supportive in his treatment. Patient states he desire treatment for his depression, anxiety, and substance abuse. Patient skin was assessed and found to be clear of any abnormal marks. PT searched and no contraband found, POC and unit policies explained and understanding verbalized. Consents obtained. Food and fluids offered, and fluids accepted. Pt had no additional questions or concerns.
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
## 2016-02-21 NOTE — BHH Group Notes (Signed)
[facility] LCSW Group Therapy Note   02/21/2016  10:00 until 11:00 AM   Type of Therapy and Topic: Group Therapy: Feelings Around Returning Home & Establishing a Supportive Framework and Activity to Identify signs of Improvement or Decompensation   Participation Level: Active   Description of Group:  Patients first processed thoughts and feelings about up coming discharge. These included fears of upcoming changes, lack of change, new living environments, judgements and expectations from others and overall stigma of MH issues. We then discussed what is a supportive framework? What does it look like feel like and how do I discern it from and unhealthy non-supportive network? Learn how to cope when supports are not helpful and don't support you. Discuss what to do when your family/friends are not supportive.   Therapeutic Goals Addressed in Processing Group:  1. Patient will identify one healthy supportive network that they can use at discharge. 2. Patient will identify one factor of a supportive framework and how to tell it from an unhealthy network. 3. Patient able to identify one coping skill to use when they do not have positive supports from others. 4. Patient will demonstrate ability to communicate their needs through discussion and/or role plays.  Summary of Patient Progress:  Pt engaged somewhat easily during group session. As patients processed their anxiety about discharge and described healthy supports patient was attentive and shared importance of respect in a support person.  Patient chose no visuals to represent decompensation as "I know what that looks like" and improvement as a beautiful flower and 'dancing in the rain.' Patient especially attentive to HALT description and other patients.   Sheilah Pigeon, LCSW

## 2016-02-21 NOTE — BHH Suicide Risk Assessment (Signed)
[facility] Admission Suicide Risk Assessment   Nursing information obtained from:  Patient Demographic factors:  Male, Unemployed Current Mental Status:  NA Loss Factors:  NA Historical Factors:  Family history of suicide Risk Reduction Factors:  Sense of responsibility to family, Religious beliefs about death  Total Time spent with patient: 45 minutes Principal Problem: Severe recurrent major depression without psychotic features ([place]) Diagnosis:   Patient Active Problem List   Diagnosis Date Noted  . Severe recurrent major depression without psychotic features ([place]) [F33.2] 02/21/2016  . Sepsis ([place]) secondary to CAP [A41.9] 01/20/2016  . Influenza A [J10.1] 01/20/2016  . AKI (acute kidney injury) ([place]) [N17.9] 01/20/2016  . CKD (chronic kidney disease), stage II [N18.2] 01/20/2016  . Hyperkalemia [E87.5] 01/20/2016  . CAP (community acquired pneumonia) [J18.9] 01/18/2016  . Hypotension [I95.9] 01/18/2016  . Hyperglycemia [R73.9] 09/20/2015  . HTN (hypertension) [I10] 09/20/2015  . HLD (hyperlipidemia) [E78.5] 09/20/2015  . Substance abuse [F19.10] 08/30/2014  . Substance induced mood disorder ([place]) [F19.94] 05/07/2014  . Cocaine abuse with cocaine-induced mood disorder ([place]) [F14.14]   . Alcohol abuse [F10.10]   . Fever [R50.9] 02/12/2014  . Sinus tachycardia [R00.0] 02/12/2014  . Marijuana abuse [F12.10] 06/28/2013  . Leukocytosis [D72.829] 06/28/2013  . Hyponatremia [E87.1] 06/28/2013  . Diabetes mellitus type II, uncontrolled ([place]) [E11.65] 12/09/2012    Continued Clinical Symptoms:  Alcohol Use Disorder Identification Test Final Score (AUDIT): 18 The "Alcohol Use Disorders Identification Test", Guidelines for Use in Primary Care, Second Edition.  World Pharmacologist [facility]). Score between 0-7:  no or low risk or alcohol related problems. Score between 8-15:  moderate risk of alcohol related problems. Score between 16-19:  high risk of alcohol related problems. Score 20  or above:  warrants further diagnostic evaluation for alcohol dependence and treatment.   CLINICAL FACTORS:  51 year old married male . Reports he has been feeling increasingly depressed, sad,  suicidal ideations with thoughts of cutting wrists. Denies any hallucinations, no psychotic symptoms. Reports significant /chronic stressors/losses - both of his sons have passed away three years ago ( one from a cardiac illness, another one was murdered), he lost job 2 weeks ago, and he has been told he may have prostatic cancer based on elevated PSA. States he has been drinking up to 120 ounces of beer several times a week, has also been using cocaine regularly , but not daily.  Dx- MDD, Alcohol Abuse Plan - inpatient admission - start Zoloft 25 mgrs QDAY for depression, start Ativan PRN as per CIWA protocol to manage potential for  alcohol WDL symptoms    Musculoskeletal: Strength & Muscle Tone: within normal limits- no tremors, no diaphoresis, no restlessness or distress  Gait & Station: normal Patient leans: N/A  Psychiatric Specialty Exam: Physical Exam  ROS denies headache, , history of R ear hypoacusia, denies chest pain, no shortness of breath, denies nausea or vomiting, denies hematuria or decreased urinary caliber,denies dysuria, no fever , no chills  Blood pressure 127/83, pulse (!) 129, temperature 98 F (36.7 C), resp. rate 20, height 5\' 7"  (1.702 m), weight 96.2 kg (212 lb).Body mass index is 33.2 kg/m.  General Appearance: Fairly Groomed  Eye Contact:  Good  Speech:  Normal Rate  Volume:  Decreased  Mood:  Depressed  Affect:  constricted, but reactive, smiles briefly at times   Thought Process:  Linear  Orientation:  Full (Time, Place, and Person)  Thought Content:  denies hallucinations, no delusions, not internally preoccupied  Suicidal Thoughts:  No denies any suicidal or self injurious ideations at this time, and contracts for safety on unit, denies any homicidal or  violent ideations   Homicidal Thoughts:  No  Memory:  recent and remote grossly intact   Judgement:  Fair  Insight:  Fair  Psychomotor Activity:  Normal  Concentration:  Concentration: Good and Attention Span: Good  Recall:  Good  Fund of Knowledge:  Good  Language:  Good  Akathisia:  Negative  Handed:  Right  AIMS (if indicated):     Assets:  Desire for Improvement Resilience  ADL's:  Intact  Cognition:  WNL  Sleep:  Number of Hours: 4      COGNITIVE FEATURES THAT CONTRIBUTE TO RISK:  Closed-mindedness and Loss of executive function    SUICIDE RISK:   Moderate:  Frequent suicidal ideation with limited intensity, and duration, some specificity in terms of plans, no associated intent, good self-control, limited dysphoria/symptomatology, some risk factors present, and identifiable protective factors, including available and accessible social support.   PLAN OF CARE: Patient will be admitted to inpatient psychiatric unit for stabilization and safety. Will provide and encourage milieu participation. Provide medication management and maked adjustments as needed.  Will follow daily.    I certify that inpatient services furnished can reasonably be expected to improve the patient's condition.  Neita Garnet, MD 02/21/2016, 12:11 PM

## 2016-02-21 NOTE — Progress Notes (Signed)
Inpatient Diabetes Program Recommendations  AACE/ADA: New Consensus Statement on Inpatient Glycemic Control (2015)  Target Ranges:  Prepandial:   less than 140 mg/dL      Peak postprandial:   less than 180 mg/dL (1-2 hours)      Critically ill patients:  140 - 180 mg/dL   Lab Results  Component Value Date   GLUCAP 352 (H) 02/21/2016   HGBA1C 13.1 (H) 09/21/2015    Review of Glycemic Control  Diabetes history: DM2 Outpatient Diabetes medications: 70/30 9 units in am and 7 units in pm Current orders for Inpatient glycemic control: same as above, Novolog 0-15 units tidwc Last HgbA1C was 6 months ago. Needs updating. Blood sugars 285-352 over last 24H. Needs insulin adjustment.  Inpatient Diabetes Program Recommendations:    Increase 70/30 to 12 units bid Add HS correction.  Will follow. Thank you. Lorenda Peck, RD, LDN, CDE Inpatient Diabetes Coordinator [phone]

## 2016-02-21 NOTE — Tx Team (Addendum)
Initial Treatment Plan 02/21/2016 1:38 AM Jonathan Austin [id number]    PATIENT STRESSORS: Financial difficulties Loss of two sons to death Substance abuse   PATIENT STRENGTHS: Ability for insight Capable of independent living Motivation for treatment/growth Supportive family/friends   PATIENT IDENTIFIED PROBLEMS:  "Anxiety"  "Work on depression"  "Work on substance abuse"  Suicidal Ideation  Depression  Substance abuse           DISCHARGE CRITERIA:  Improved stabilization in mood, thinking, and/or behavior Verbal commitment to aftercare and medication compliance  PRELIMINARY DISCHARGE PLAN: Return to previous living arrangement  PATIENT/FAMILY INVOLVEMENT: This treatment plan has been presented to and reviewed with the patient, Jonathan Austin.  The patient  have been given the opportunity to ask questions and make suggestions.  Jolene Provost, RN 02/21/2016, 1:38 AM

## 2016-02-21 NOTE — Progress Notes (Signed)
Patient did attend the evening speaker AA meeting.  
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
## 2016-02-21 NOTE — [facility] Group Notes (Signed)
[facility] Group Notes:  (Nursing/MHT/Case Management/Adjunct)  Date:  02/21/2016  Time:  5:52 PM Type of Therapy:  Psychoeducational Skills  Participation Level:  Active  Participation Quality:  Appropriate  Affect:  Appropriate  Cognitive:  Appropriate  Insight:  Appropriate  Engagement in Group:  Engaged  Modes of Intervention:  Problem-solving  Summary of Progress/Problems: Topic was on leisure and lifestyle changes. Discussed the important of choosing healthy leisure activities. Group encouraged to surround themselves with positive and healthy group/support system when changing to a healthy life style.    Thurnell Lose 02/21/2016, 5:52 PM

## 2016-02-22 LAB — GLUCOSE, CAPILLARY
GLUCOSE-CAPILLARY: 232 mg/dL — AB (ref 65–99)
GLUCOSE-CAPILLARY: 313 mg/dL — AB (ref 65–99)
Glucose-Capillary: 301 mg/dL — ABNORMAL HIGH (ref 65–99)
Glucose-Capillary: 262 mg/dL — ABNORMAL HIGH (ref 65–99)

## 2016-02-22 MED ORDER — ENSURE ENLIVE PO LIQD: 237.0000 mL | Freq: Every day | ORAL | Status: DC | PRN

## 2016-02-22 MED ORDER — INSULIN ASPART 100 UNIT/ML ~~LOC~~ SOLN
8.0000 [IU] | Freq: Once | SUBCUTANEOUS | Status: AC
  Administered 2016-02-22: 8 [IU] via SUBCUTANEOUS

## 2016-02-22 MED ORDER — INSULIN ASPART PROT & ASPART (70-30 MIX) 100 UNIT/ML ~~LOC~~ SUSP
10.0000 [IU] | Freq: Two times a day (BID) | SUBCUTANEOUS | Status: DC
  Administered 2016-02-22 – 2016-02-26 (×8): 10 [IU] via SUBCUTANEOUS

## 2016-02-22 MED ORDER — METFORMIN HCL 850 MG PO TABS
850.0000 mg | ORAL_TABLET | Freq: Two times a day (BID) | ORAL | Status: DC
  Administered 2016-02-23 – 2016-02-26 (×7): 850 mg via ORAL
  Filled 2016-02-22 (×4): qty 1
  Filled 2016-02-22: qty 14
  Filled 2016-02-22 (×5): qty 1
  Filled 2016-02-22: qty 14

## 2016-02-22 MED ORDER — INSULIN ASPART 100 UNIT/ML ~~LOC~~ SOLN
0.0000 [IU] | Freq: Three times a day (TID) | SUBCUTANEOUS | Status: DC
  Administered 2016-02-23: 11 [IU] via SUBCUTANEOUS
  Administered 2016-02-23: 5 [IU] via SUBCUTANEOUS
  Administered 2016-02-23: 3 [IU] via SUBCUTANEOUS
  Administered 2016-02-23: 8 [IU] via SUBCUTANEOUS
  Administered 2016-02-24 (×2): 5 [IU] via SUBCUTANEOUS
  Administered 2016-02-24: 8 [IU] via SUBCUTANEOUS
  Administered 2016-02-24: 3 [IU] via SUBCUTANEOUS
  Administered 2016-02-25: 8 [IU] via SUBCUTANEOUS
  Administered 2016-02-25: 3 [IU] via SUBCUTANEOUS
  Administered 2016-02-25: 5 [IU] via SUBCUTANEOUS
  Administered 2016-02-25: 8 [IU] via SUBCUTANEOUS
  Administered 2016-02-26: 5 [IU] via SUBCUTANEOUS
  Administered 2016-02-26: 3 [IU] via SUBCUTANEOUS

## 2016-02-22 MED ORDER — INSULIN ASPART 100 UNIT/ML ~~LOC~~ SOLN: 0.0000 [IU] | Freq: Three times a day (TID) | SUBCUTANEOUS | Status: DC

## 2016-02-22 NOTE — Progress Notes (Signed)
NUTRITION ASSESSMENT  Pt identified as at risk on the Malnutrition Screen Tool  INTERVENTION: 1. Educated patient on the importance of nutrition and encouraged intake of food and beverages. 2. Discussed weight goals. 3. Supplements: will order Ensure Enlive once/day PRN, this supplement provides 350 kcal and 20 grams of protein  NUTRITION DIAGNOSIS: Unintentional weight loss related to sub-optimal intake as evidenced by pt report.   Goal: Pt to meet >/= 90% of their estimated nutrition needs.  Monitor:  PO intake  Assessment:  Pt admitted for SI, polysubstance abuse (2 six-packs of beer/day and cocaine use), and depression. Per chart review, pt has lost 13 lbs (5.8% body weight) in the past 1 month which is significant for time frame. Will order Ensure Enlive once/day PRN to assist, if needed. Continue to encourage PO intakes of meals and snacks.   51 y.o. male  Height: Ht Readings from Last 1 Encounters:  02/21/16 5\' 7"  (1.702 m)    Weight: Wt Readings from Last 1 Encounters:  02/21/16 212 lb (96.2 kg)    Weight Hx: Wt Readings from Last 10 Encounters:  02/21/16 212 lb (96.2 kg)  02/19/16 225 lb (102.1 kg)  01/19/16 225 lb (102.1 kg)  12/10/15 230 lb (104.3 kg)  08/30/14 221 lb (100.2 kg)  08/29/14 221 lb 1.6 oz (100.3 kg)  05/07/14 228 lb (103.4 kg)  05/06/14 230 lb (104.3 kg)  04/13/14 235 lb (106.6 kg)  02/13/14 235 lb (106.6 kg)    BMI:  Body mass index is 33.2 kg/m. Pt meets criteria for obesity based on current BMI.  Estimated Nutritional Needs: Kcal: 25-30 kcal/kg Protein: > 1 gram protein/kg Fluid: 1 ml/kcal  Diet Order: Diet Carb Modified Fluid consistency: Thin; Room service appropriate? Yes Pt is also offered choice of unit snacks mid-morning and mid-afternoon.  Pt is eating as desired.   Lab results and medications reviewed.     Jarome Matin, MS, RD, LDN, [facility] Inpatient Clinical Dietitian Pager # [phone] After hours/weekend pager #  [phone]

## 2016-02-22 NOTE — Progress Notes (Signed)
Recreation Therapy Notes  Date: 02/22/16 Time: 0930 Location: 300 Hall Dayroom  Group Topic: Stress Management  Goal Area(s) Addresses:  Patient will verbalize importance of using healthy stress management.  Patient will identify positive emotions associated with healthy stress management.   Intervention: Stress Management  Activity :  Peaceful [place]aves Guided Imagery.  LRT introduced the stress management concept of guided imagery.  LRT read a script to allow patients the opportunity engage and participate in the activity.  Patients were to follow along as LRT read script to participate in the activity.  Education:  Stress Management, Discharge Planning.   Education Outcome: Acknowledges edcuation/In group clarification offered/Needs additional education  Clinical Observations/Feedback: Pt did not attend group.   Victorino Sparrow, LRT/CTRS         Victorino Sparrow A 02/22/2016 12:13 PM

## 2016-02-22 NOTE — Progress Notes (Addendum)
Inpatient Diabetes Program Recommendations  AACE/ADA: New Consensus Statement on Inpatient Glycemic Control (2015)  Target Ranges:  Prepandial:   less than 140 mg/dL      Peak postprandial:   less than 180 mg/dL (1-2 hours)      Critically ill patients:  140 - 180 mg/dL   Results for Jonathan Austin, Jonathan Austin (MRN [id number]) as of 02/22/2016 09:12  Ref. Range 02/21/2016 06:15 02/21/2016 11:32 02/21/2016 17:16 02/21/2016 21:13  Glucose-Capillary Latest Ref Range: 65 - 99 mg/dL 286 (H) 352 (H) 354 (H) 325 (H)   Results for Jonathan Austin, Jonathan Austin (MRN [id number]) as of 02/22/2016 09:12  Ref. Range 02/22/2016 05:59  Glucose-Capillary Latest Ref Range: 65 - 99 mg/dL 301 (H)     Admit with: Major Depressive Disorder, Recurrent, Severe Without Psychotic Features  History: DM, Substance Abuse, CKD  Home DM Meds: 70/30 Insulin- 9 units AM/ 7 units PM  Current Insulin Orders: 70/30 Insulin- 9 units AM/ 7 units PM      Novolog Moderate Correction Scale/ SSI (0-15 units) TID AC       MD- Please consider the following in-hospital insulin adjustments:  1. Increase 70/30 Insulin to 12 units BID with meals  2. Increase Novolog Correction Scale/ SSI to Resistant scale (0-20 units) TID AC + HS     --Will follow patient during hospitalization--  Wyn Quaker RN, MSN, CDE Diabetes Coordinator Inpatient Glycemic Control Team Team Pager: [phone] (8a-5p)

## 2016-02-22 NOTE — Progress Notes (Signed)
Pt present with calm and pleasant mood in the unit. Pt has been present in the day room interacting with peers. As per self inventory, pt had a good night sleep, good appetite, normal energy, and good concentration. Pt rate depression at 4, hopeless ness at 3, and anxiety at 3.  Pt's goal is to "staying clean, focus on my health issues."Pt's safety ensured with 15 minute and environmental checks. Pt currently denies SI/HI and A/V hallucinations. Pt verbally agrees to seek staff if SI/HI or A/VH occurs and to consult with staff before acting on these thoughts. Will continue POC.

## 2016-02-22 NOTE — Tx Team (Signed)
Interdisciplinary Treatment and Diagnostic Plan Update  02/22/2016 Time of Session: 9:30AM Devaunte Zambrana MRN: [id number]  Principal Diagnosis: Severe recurrent major depression without psychotic features [facility])  Secondary Diagnoses: Principal Problem:   Severe recurrent major depression without psychotic features ([place]) Active Problems:   Sinus tachycardia   Substance induced mood disorder (HCC)   Current Medications:  Current Facility-Administered Medications  Medication Dose Route Frequency Provider Last Rate Last Dose  . acetaminophen (TYLENOL) tablet 650 mg  650 mg Oral Q6H PRN Rozetta Nunnery, NP      . alum & mag hydroxide-simeth (MAALOX/MYLANTA) 200-200-20 MG/5ML suspension 30 mL  30 mL Oral Q4H PRN Rozetta Nunnery, NP      . amLODipine (NORVASC) tablet 5 mg  5 mg Oral Daily Rozetta Nunnery, NP   5 mg at 02/22/16 0823  . cholecalciferol (VITAMIN D) tablet 1,000 Units  1,000 Units Oral Daily Rozetta Nunnery, NP   1,000 Units at 02/22/16 [id number]  . hydrOXYzine (ATARAX/VISTARIL) tablet 25 mg  25 mg Oral Q6H PRN Rozetta Nunnery, NP      . insulin aspart (novoLOG) injection 0-15 Units  0-15 Units Subcutaneous TID WC Rozetta Nunnery, NP   11 Units at 02/22/16 0606  . insulin aspart protamine- aspart (NOVOLOG MIX 70/30) injection 7 Units  7 Units Subcutaneous Q supper Rozetta Nunnery, NP   7 Units at 02/21/16 1719  . insulin aspart protamine- aspart (NOVOLOG MIX 70/30) injection 9 Units  9 Units Subcutaneous Q breakfast Rozetta Nunnery, NP   9 Units at 02/22/16 0820  . loperamide (IMODIUM) capsule 2-4 mg  2-4 mg Oral PRN Rozetta Nunnery, NP      . LORazepam (ATIVAN) tablet 1 mg  1 mg Oral Q6H PRN Rozetta Nunnery, NP      . magnesium hydroxide (MILK OF MAGNESIA) suspension 30 mL  30 mL Oral Daily PRN Rozetta Nunnery, NP      . ondansetron (ZOFRAN-ODT) disintegrating tablet 4 mg  4 mg Oral Q6H PRN Rozetta Nunnery, NP      . sertraline (ZOLOFT) tablet 25 mg  25 mg Oral Daily Nanci Pina, FNP   25 mg at 02/22/16  [id number]  . simvastatin (ZOCOR) tablet 20 mg  20 mg Oral Daily Rozetta Nunnery, NP   20 mg at 02/22/16 0823  . traZODone (DESYREL) tablet 50 mg  50 mg Oral QHS,MR X 1 Rozetta Nunnery, NP       PTA Medications: Prescriptions Prior to Admission  Medication Sig Dispense Refill Last Dose  . amLODipine (NORVASC) 5 MG tablet Take 1 tablet (5 mg total) by mouth daily. 30 tablet 0 Past Week at Unknown time  . Cholecalciferol (VITAMIN D PO) Take 1 tablet by mouth daily.   Past Week at Unknown time  . insulin NPH-regular Human (NOVOLIN 70/30) (70-30) 100 UNIT/ML injection Inject 7-9 Units into the skin See admin instructions. Inject 9 units subcutaneously with breakfast and 7 units with supper   02/21/2016 at Unknown time  . simvastatin (ZOCOR) 20 MG tablet Take 1 tablet by mouth daily.   Past Week at Unknown time  . [DISCONTINUED] oseltamivir (TAMIFLU) 75 MG capsule Take 1 capsule (75 mg total) by mouth 2 (two) times daily. 5 capsule 0 Past Week at Unknown time  . [DISCONTINUED] doxycycline (VIBRAMYCIN) 100 MG capsule Take 1 capsule (100 mg total) by mouth 2 (two) times daily. (Patient not taking: Reported on 02/21/2016) 10 capsule 0 Unknown  at Unknown time    Patient Stressors: Financial difficulties Loss of two sons to death Substance abuse  Patient Strengths: Ability for insight Capable of independent living Motivation for treatment/growth Supportive family/friends  Treatment Modalities: Medication Management, Group therapy, Case management,  1 to 1 session with clinician, Psychoeducation, Recreational therapy.   Physician Treatment Plan for Primary Diagnosis: Severe recurrent major depression without psychotic features ([place]) Long Term Goal(s): Improvement in symptoms so as ready for discharge Improvement in symptoms so as ready for discharge   Short Term Goals: Ability to identify changes in lifestyle to reduce recurrence of condition will improve Ability to verbalize feelings will improve Ability  to disclose and discuss suicidal ideas Ability to demonstrate self-control will improve Ability to identify and develop effective coping behaviors will improve Ability to maintain clinical measurements within normal limits will improve Compliance with prescribed medications will improve Ability to identify triggers associated with substance abuse/mental health issues will improve  Medication Management: Evaluate patient's response, side effects, and tolerance of medication regimen.  Therapeutic Interventions: 1 to 1 sessions, Unit Group sessions and Medication administration.  Evaluation of Outcomes: Progressing  Physician Treatment Plan for Secondary Diagnosis: Principal Problem:   Severe recurrent major depression without psychotic features ([place]) Active Problems:   Sinus tachycardia   Substance induced mood disorder ([place])  Long Term Goal(s): Improvement in symptoms so as ready for discharge Improvement in symptoms so as ready for discharge   Short Term Goals: Ability to identify changes in lifestyle to reduce recurrence of condition will improve Ability to verbalize feelings will improve Ability to disclose and discuss suicidal ideas Ability to demonstrate self-control will improve Ability to identify and develop effective coping behaviors will improve Ability to maintain clinical measurements within normal limits will improve Compliance with prescribed medications will improve Ability to identify triggers associated with substance abuse/mental health issues will improve     Medication Management: Evaluate patient's response, side effects, and tolerance of medication regimen.  Therapeutic Interventions: 1 to 1 sessions, Unit Group sessions and Medication administration.  Evaluation of Outcomes: Progressing   RN Treatment Plan for Primary Diagnosis: Severe recurrent major depression without psychotic features ([place]) Long Term Goal(s): Knowledge of disease and therapeutic  regimen to maintain health will improve  Short Term Goals: Ability to remain free from injury will improve, Ability to verbalize feelings will improve and Ability to disclose and discuss suicidal ideas  Medication Management: RN will administer medications as ordered by provider, will assess and evaluate patient's response and provide education to patient for prescribed medication. RN will report any adverse and/or side effects to prescribing provider.  Therapeutic Interventions: 1 on 1 counseling sessions, Psychoeducation, Medication administration, Evaluate responses to treatment, Monitor vital signs and CBGs as ordered, Perform/monitor CIWA, COWS, AIMS and Fall Risk screenings as ordered, Perform wound care treatments as ordered.  Evaluation of Outcomes: Progressing   LCSW Treatment Plan for Primary Diagnosis: Severe recurrent major depression without psychotic features ([place]) Long Term Goal(s): Safe transition to appropriate next level of care at discharge, Engage patient in therapeutic group addressing interpersonal concerns.  Short Term Goals: Engage patient in aftercare planning with referrals and resources, Facilitate patient progression through stages of change regarding substance use diagnoses and concerns and Identify triggers associated with mental health/substance abuse issues  Therapeutic Interventions: Assess for all discharge needs, 1 to 1 time with Social worker, Explore available resources and support systems, Assess for adequacy in community support network, Educate family and significant other(s) on suicide prevention, Complete  Psychosocial Assessment, Interpersonal group therapy.  Evaluation of Outcomes: Progressing   Progress in Treatment: Attending groups: Yes. Participating in groups: Yes. Taking medication as prescribed: Yes. Toleration medication: Yes. Family/Significant other contact made: No, will contact:  family member if patient consents. Patient understands  diagnosis: Yes. Discussing patient identified problems/goals with staff: Yes. Medical problems stabilized or resolved: Yes. Denies suicidal/homicidal ideation: No. Passive SI reported but pt is able to contract for safety on the unit.  Issues/concerns per patient self-inventory: No. Other: n/a  New problem(s) identified: No, Describe:  n/a  New Short Term/Long Term Goal(s): medication management; Detox, elimination of SI thoughts, mood stabilization, and development of comprehensive mental wellness/sobriety plan.   Discharge Plan or Barriers: CSW assessing for appropriate referrals.   Reason for Continuation of Hospitalization: Depression Medication stabilization Suicidal ideation Withdrawal symptoms  Estimated Length of Stay: 3-5 days   Attendees: Patient: 02/22/2016 8:45 AM  Physician: Dr. Parke Poisson MD 02/22/2016 8:45 AM  Nursing: Jonni Sanger RN 02/22/2016 8:45 AM  RN Care Manager: Lars Pinks CM 02/22/2016 8:45 AM  Social Worker: Nira Conn Smart, LCSW; Adriana Reams LCSW 02/22/2016 8:45 AM  Recreational Therapist:  02/22/2016 8:45 AM  Other: Lindell Spar NP 02/22/2016 8:45 AM  Other:  02/22/2016 8:45 AM  Other: 02/22/2016 8:45 AM    Scribe for Treatment Team: [place], LCSW 02/22/2016 8:45 AM

## 2016-02-22 NOTE — Progress Notes (Addendum)
[facility] MD Progress Note  02/22/2016 6:47 PM Jonathan Austin  MRN:  [id number] Subjective:  Patient states " I am feeling better today". At this time does not endorse withdrawal symptoms or cravings. Denies medication side effects. Objective : I have discussed case with treatment team and have met with patient . Patient presents improved compared to admission- states he is feeling better, less depressed, and does present with a fuller range of affect.Currently denies any suicidal ideations. He is not presenting with withdrawal symptoms- no significant distal tremors, no diaphoresis, no psychomotor agitation, denies visual disturbances. Tolerating Zoloft trial well .  Principal Problem: Severe recurrent major depression without psychotic features ([place]) Diagnosis:   Patient Active Problem List   Diagnosis Date Noted  . Severe recurrent major depression without psychotic features ([place]) [F33.2] 02/21/2016  . Sepsis ([place]) secondary to CAP [A41.9] 01/20/2016  . Influenza A [J10.1] 01/20/2016  . AKI (acute kidney injury) ([place]) [N17.9] 01/20/2016  . CKD (chronic kidney disease), stage II [N18.2] 01/20/2016  . Hyperkalemia [E87.5] 01/20/2016  . CAP (community acquired pneumonia) [J18.9] 01/18/2016  . Hypotension [I95.9] 01/18/2016  . Hyperglycemia [R73.9] 09/20/2015  . HTN (hypertension) [I10] 09/20/2015  . HLD (hyperlipidemia) [E78.5] 09/20/2015  . Substance abuse [F19.10] 08/30/2014  . Substance induced mood disorder ([place]) [F19.94] 05/07/2014  . Cocaine abuse with cocaine-induced mood disorder ([place]) [F14.14]   . Alcohol abuse [F10.10]   . Fever [R50.9] 02/12/2014  . Sinus tachycardia [R00.0] 02/12/2014  . Marijuana abuse [F12.10] 06/28/2013  . Leukocytosis [D72.829] 06/28/2013  . Hyponatremia [E87.1] 06/28/2013  . Diabetes mellitus type II, uncontrolled ([place]) [E11.65] 12/09/2012   Total Time spent with patient: 20 minutes  Past Medical History:  Past Medical History:  Diagnosis Date  .  Cellulitis 06/28/2013  . Cocaine abuse 06/28/2013  . Cocaine abuse with cocaine-induced mood disorder ([place])   . Depression   . Diabetes mellitus   . High cholesterol   . Hypertension   . Scrotal infection 06/28/2013  . Sleep apnea   . Suicidal ideations     Past Surgical History:  Procedure Laterality Date  . TONSILLECTOMY     Family History:  Family History  Problem Relation Age of Onset  . Hypertension Mother   . Hypertension Father   . Diabetes Father    Social History:  History  Alcohol Use  . 1.8 oz/week  . 1 Glasses of wine, 1 Cans of beer, 1 Shots of liquor per week    Comment: Occasionally     History  Drug Use  . Types: Cocaine, Marijuana    Social History   Social History  . Marital status: Married    Spouse name: N/A  . Number of children: N/A  . Years of education: N/A   Social History Main Topics  . Smoking status: Never Smoker  . Smokeless tobacco: Never Used  . Alcohol use 1.8 oz/week    1 Glasses of wine, 1 Cans of beer, 1 Shots of liquor per week     Comment: Occasionally  . Drug use:     Types: Cocaine, Marijuana  . Sexual activity: Yes   Other Topics Concern  . None   Social History Narrative  . None   Additional Social History:   Sleep: Good  Appetite:  Good  Current Medications: Current Facility-Administered Medications  Medication Dose Route Frequency Provider Last Rate Last Dose  . acetaminophen (TYLENOL) tablet 650 mg  650 mg Oral Q6H PRN Rozetta Nunnery, NP      .  alum & mag hydroxide-simeth (MAALOX/MYLANTA) 200-200-20 MG/5ML suspension 30 mL  30 mL Oral Q4H PRN Rozetta Nunnery, NP      . amLODipine (NORVASC) tablet 5 mg  5 mg Oral Daily Rozetta Nunnery, NP   5 mg at 02/22/16 0823  . cholecalciferol (VITAMIN D) tablet 1,000 Units  1,000 Units Oral Daily Rozetta Nunnery, NP   1,000 Units at 02/22/16 [id number]  . feeding supplement (ENSURE ENLIVE) (ENSURE ENLIVE) liquid 237 mL  237 mL Oral Daily PRN Jenne Campus, MD      . hydrOXYzine  (ATARAX/VISTARIL) tablet 25 mg  25 mg Oral Q6H PRN Rozetta Nunnery, NP      . insulin aspart (novoLOG) injection 0-15 Units  0-15 Units Subcutaneous TID WC Rozetta Nunnery, NP   5 Units at 02/22/16 1720  . insulin aspart protamine- aspart (NOVOLOG MIX 70/30) injection 7 Units  7 Units Subcutaneous Q supper Rozetta Nunnery, NP   7 Units at 02/22/16 1718  . insulin aspart protamine- aspart (NOVOLOG MIX 70/30) injection 9 Units  9 Units Subcutaneous Q breakfast Rozetta Nunnery, NP   9 Units at 02/22/16 0820  . loperamide (IMODIUM) capsule 2-4 mg  2-4 mg Oral PRN Rozetta Nunnery, NP      . LORazepam (ATIVAN) tablet 1 mg  1 mg Oral Q6H PRN Rozetta Nunnery, NP      . magnesium hydroxide (MILK OF MAGNESIA) suspension 30 mL  30 mL Oral Daily PRN Rozetta Nunnery, NP      . ondansetron (ZOFRAN-ODT) disintegrating tablet 4 mg  4 mg Oral Q6H PRN Rozetta Nunnery, NP      . sertraline (ZOLOFT) tablet 25 mg  25 mg Oral Daily Nanci Pina, FNP   25 mg at 02/22/16 3833  . simvastatin (ZOCOR) tablet 20 mg  20 mg Oral Daily Rozetta Nunnery, NP   20 mg at 02/22/16 0823  . traZODone (DESYREL) tablet 50 mg  50 mg Oral QHS,MR X 1 Rozetta Nunnery, NP        Lab Results:  Results for orders placed or performed during the hospital encounter of 02/21/16 (from the past 48 hour(s))  Glucose, capillary     Status: Abnormal   Collection Time: 02/21/16  6:15 AM  Result Value Ref Range   Glucose-Capillary 286 (H) 65 - 99 mg/dL  Glucose, capillary     Status: Abnormal   Collection Time: 02/21/16 11:32 AM  Result Value Ref Range   Glucose-Capillary 352 (H) 65 - 99 mg/dL   Comment 1 Notify RN    Comment 2 Document in Chart   Glucose, capillary     Status: Abnormal   Collection Time: 02/21/16  5:16 PM  Result Value Ref Range   Glucose-Capillary 354 (H) 65 - 99 mg/dL   Comment 1 Notify RN    Comment 2 Document in Chart   Glucose, capillary     Status: Abnormal   Collection Time: 02/21/16  9:13 PM  Result Value Ref Range    Glucose-Capillary 325 (H) 65 - 99 mg/dL   Comment 1 Notify RN    Comment 2 Document in Chart   Glucose, capillary     Status: Abnormal   Collection Time: 02/22/16  5:59 AM  Result Value Ref Range   Glucose-Capillary 301 (H) 65 - 99 mg/dL  Glucose, capillary     Status: Abnormal   Collection Time: 02/22/16 11:50 AM  Result Value Ref Range  Glucose-Capillary 262 (H) 65 - 99 mg/dL  Glucose, capillary     Status: Abnormal   Collection Time: 02/22/16  5:00 PM  Result Value Ref Range   Glucose-Capillary 232 (H) 65 - 99 mg/dL    Blood Alcohol level:  Lab Results  Component Value Date   ETH 7 (H) 02/19/2016   ETH <5 22/29/7989    Metabolic Disorder Labs: Lab Results  Component Value Date   HGBA1C 13.1 (H) 09/21/2015   MPG 329 09/21/2015   MPG 303 08/31/2014   No results found for: PROLACTIN Lab Results  Component Value Date   CHOL 265 (H) 08/31/2014   TRIG 351 (H) 08/31/2014   HDL 49 08/31/2014   CHOLHDL 5.4 08/31/2014   VLDL 70 (H) 08/31/2014   LDLCALC 146 (H) 08/31/2014   LDLCALC 163 (H) 05/07/2014    Physical Findings: AIMS: Facial and Oral Movements Muscles of Facial Expression: None, normal Lips and Perioral Area: None, normal Jaw: None, normal Tongue: None, normal,Extremity Movements Upper (arms, wrists, hands, fingers): None, normal Lower (legs, knees, ankles, toes): None, normal, Trunk Movements Neck, shoulders, hips: None, normal, Overall Severity Severity of abnormal movements (highest score from questions above): None, normal Incapacitation due to abnormal movements: None, normal Patient's awareness of abnormal movements (rate only patient's report): No Awareness, Dental Status Current problems with teeth and/or dentures?: No Does patient usually wear dentures?: No  CIWA:  CIWA-Ar Total: 1 COWS:     Musculoskeletal: Strength & Muscle Tone: within normal limits- no distal tremors, no diaphoresis, no acute distress or restlessness  Gait & Station:  normal Patient leans: N/A  Psychiatric Specialty Exam: Physical Exam  ROS denies headache, denies visual disturbances, no nausea or vomiting,  Blood pressure (!) 145/97, pulse 91, temperature 97.7 F (36.5 C), temperature source Oral, resp. rate 16, height _0  (1.702 m), weight 96.2 kg (212 lb).Body mass index is 33.2 kg/m.  General Appearance: improved grooming   Eye Contact:  Good  Speech:  Normal Rate  Volume:  Normal  Mood:  improved and currently presents euthymic   Affect:  Appropriate and reactive   Thought Process:  Linear  Orientation:  Other:  fully alert and attentive   Thought Content:  denies hallucinations, no delusions, not internally preoccupied   Suicidal Thoughts:  No- denies any suicidal or self injurious ideations , denies any homicidal or violent ideations  Homicidal Thoughts:  No  Memory:  recent and remote grossly intact   Judgement:  Other:  improving   Insight:  improving   Psychomotor Activity:  Normal  Concentration:  Concentration: Good and Attention Span: Good  Recall:  Good  Fund of Knowledge:  Good  Language:  Good  Akathisia:  Negative  Handed:  Right  AIMS (if indicated):     Assets:  Desire for Improvement Resilience  ADL's:  Intact  Cognition:  WNL  Sleep:  Number of Hours: 5.75   Assessment - patient presents improved compared to admission . At this time reports improved mood and presenting with brighter/ full range of affect. Denies any SI at this time. He is not currently presenting with symptoms of alcohol WDL- no tremors, no diaphoresis, no restlessness, no headache, no visual disturbances, no tachycardia. BP is elevated- patient has history of HTN. Tolerating Zoloft trial well thus far.  Treatment Plan Summary: Daily contact with patient to assess and evaluate symptoms and progress in treatment, Medication management, Plan inpatient admission  and medications as below Encourage ongoing group and milieu  participation to work on Scientific laboratory technician and symptom reduction  Continue to encourage efforts to work on sobriety, relapse prevention Treatment team working on disposition [place] as per CIWA protocol to address potential symptoms of alcohol WDL Continue Zoloft 25 mgrs QDAY for depression , anxiety Continue Simvastatin for hypercholesterolemia, Amlodipine for HTN, and Diabetic management  Recheck BMP in AM to follow up on hyponatremia and renal function Treatment team working on disposition planning options  Neita Garnet, MD 02/22/2016, 6:47 PM

## 2016-02-22 NOTE — [facility] Group Notes (Signed)
[place] LCSW Group Therapy  02/22/2016 4:05 PM  Type of Therapy:  Group Therapy  Participation Level:  Did Not Attend-pt invited. Chose to remain in bed.   Summary of Progress/Problems: Today's Topic: Overcoming Obstacles. Patients identified one short term goal and potential obstacles in reaching this goal. Patients processed barriers involved in overcoming these obstacles. Patients identified steps necessary for overcoming these obstacles and explored motivation (internal and external) for facing these difficulties head on.   Jonathan Austin Jonathan Smart LCSW 02/22/2016, 4:05 PM

## 2016-02-22 NOTE — Plan of Care (Signed)
Problem: Activity: Goal: Interest or engagement in leisure activities will improve Outcome: Progressing Pt did attend evening AA group this weekend

## 2016-02-23 LAB — GLUCOSE, CAPILLARY
GLUCOSE-CAPILLARY: 283 mg/dL — AB (ref 65–99)
GLUCOSE-CAPILLARY: 206 mg/dL — AB (ref 65–99)
Glucose-Capillary: 169 mg/dL — ABNORMAL HIGH (ref 65–99)
Glucose-Capillary: 322 mg/dL — ABNORMAL HIGH (ref 65–99)

## 2016-02-23 LAB — URINALYSIS, ROUTINE W REFLEX MICROSCOPIC
Bilirubin Urine: NEGATIVE
GLUCOSE, UA: NEGATIVE mg/dL
Hgb urine dipstick: NEGATIVE
Ketones, ur: NEGATIVE mg/dL
LEUKOCYTES UA: NEGATIVE
Nitrite: NEGATIVE
PH: 5 (ref 5.0–8.0)
Protein, ur: NEGATIVE mg/dL
SPECIFIC GRAVITY, URINE: 1.011 (ref 1.005–1.030)

## 2016-02-23 LAB — BASIC METABOLIC PANEL
Anion gap: 8 (ref 5–15)
BUN: 14 mg/dL (ref 6–20)
CALCIUM: 8.9 mg/dL (ref 8.9–10.3)
CHLORIDE: 103 mmol/L (ref 101–111)
CO2: 27 mmol/L (ref 22–32)
CREATININE: 1.06 mg/dL (ref 0.61–1.24)
Glucose, Bld: 176 mg/dL — ABNORMAL HIGH (ref 65–99)
Potassium: 3.8 mmol/L (ref 3.5–5.1)
SODIUM: 138 mmol/L (ref 135–145)

## 2016-02-23 NOTE — Progress Notes (Signed)
D: Pt at the time of assessment was alert and oriented x4. Pt denies any form of depression, anxiety, pain, SI, HI or AVH; he states, "I think my medicines are working; been a while since I felt this good." Pt remained calm and cooperative. A: Medications offered as prescribed.  Support, encouragement, and safe environment provided. 15-minute safety checks continue. R: Pt was med compliant.  Pt attended group. Safety checks continue.

## 2016-02-23 NOTE — [facility] Group Notes (Signed)
[place] LCSW Group Therapy  02/23/2016 4:17 PM  Type of Therapy:  Group Therapy  Participation Level:  Active  Participation Quality:  Attentive  Affect:  Appropriate  Cognitive:  Alert and Oriented  Insight:  Improving  Engagement in Therapy:  Improving  Modes of Intervention:  Confrontation, Discussion, Education, Problem-solving, Socialization and Support  Summary of Progress/Problems: [facility] Speaker came to talk about his personal journey with substance abuse and addiction. The pt processed ways by which to relate to the speaker. [place] speaker provided handouts and educational information pertaining to groups and services offered by the [facility].   Letisia Schwalb N Smart LCSW 02/23/2016, 4:17 PM

## 2016-02-23 NOTE — Progress Notes (Signed)
[facility] MD Progress Note  02/23/2016 3:02 PM Jonathan Austin  MRN:  [id number] Subjective: Im doing good. I can concentrating and focusing more better. Im sleeping better with the medicine. I am talking to my support group .   Objective : I have discussed case with treatment team and have met with patient .Patient presents improved compared to admission- states he is feeling better. He currently rates his depression 1/10 and anxiety 1/10 with 0 being the least and 10 being the worse. He reports his goal today is to focus on his diabetes and eat in moderation as he knows his diet greatly effects his diabetes. Currently denies any suicidal ideations. He is not presenting with withdrawal symptoms- no significant distal tremors, no diaphoresis, no psychomotor agitation, denies visual disturbances. Tolerating Zoloft trial well .  Principal Problem: Severe recurrent major depression without psychotic features ([place]) Diagnosis:   Patient Active Problem List   Diagnosis Date Noted  . Severe recurrent major depression without psychotic features ([place]) [F33.2] 02/21/2016  . Sepsis ([place]) secondary to CAP [A41.9] 01/20/2016  . Influenza A [J10.1] 01/20/2016  . AKI (acute kidney injury) ([place]) [N17.9] 01/20/2016  . CKD (chronic kidney disease), stage II [N18.2] 01/20/2016  . Hyperkalemia [E87.5] 01/20/2016  . CAP (community acquired pneumonia) [J18.9] 01/18/2016  . Hypotension [I95.9] 01/18/2016  . Hyperglycemia [R73.9] 09/20/2015  . HTN (hypertension) [I10] 09/20/2015  . HLD (hyperlipidemia) [E78.5] 09/20/2015  . Substance abuse [F19.10] 08/30/2014  . Substance induced mood disorder ([place]) [F19.94] 05/07/2014  . Cocaine abuse with cocaine-induced mood disorder ([place]) [F14.14]   . Alcohol abuse [F10.10]   . Fever [R50.9] 02/12/2014  . Sinus tachycardia [R00.0] 02/12/2014  . Marijuana abuse [F12.10] 06/28/2013  . Leukocytosis [D72.829] 06/28/2013  . Hyponatremia [E87.1] 06/28/2013  . Diabetes mellitus type  II, uncontrolled ([place]) [E11.65] 12/09/2012   Total Time spent with patient: 20 minutes  Past Medical History:  Past Medical History:  Diagnosis Date  . Cellulitis 06/28/2013  . Cocaine abuse 06/28/2013  . Cocaine abuse with cocaine-induced mood disorder ([place])   . Depression   . Diabetes mellitus   . High cholesterol   . Hypertension   . Scrotal infection 06/28/2013  . Sleep apnea   . Suicidal ideations     Past Surgical History:  Procedure Laterality Date  . TONSILLECTOMY     Family History:  Family History  Problem Relation Age of Onset  . Hypertension Mother   . Hypertension Father   . Diabetes Father    Social History:  History  Alcohol Use  . 1.8 oz/week  . 1 Glasses of wine, 1 Cans of beer, 1 Shots of liquor per week    Comment: Occasionally     History  Drug Use  . Types: Cocaine, Marijuana    Social History   Social History  . Marital status: Married    Spouse name: N/A  . Number of children: N/A  . Years of education: N/A   Social History Main Topics  . Smoking status: Never Smoker  . Smokeless tobacco: Never Used  . Alcohol use 1.8 oz/week    1 Glasses of wine, 1 Cans of beer, 1 Shots of liquor per week     Comment: Occasionally  . Drug use:     Types: Cocaine, Marijuana  . Sexual activity: Yes   Other Topics Concern  . None   Social History Narrative  . None   Additional Social History:   Sleep: Good  Appetite:  Good  Current  Medications: Current Facility-Administered Medications  Medication Dose Route Frequency Provider Last Rate Last Dose  . acetaminophen (TYLENOL) tablet 650 mg  650 mg Oral Q6H PRN Rozetta Nunnery, NP      . alum & mag hydroxide-simeth (MAALOX/MYLANTA) 200-200-20 MG/5ML suspension 30 mL  30 mL Oral Q4H PRN Rozetta Nunnery, NP      . amLODipine (NORVASC) tablet 5 mg  5 mg Oral Daily Rozetta Nunnery, NP   5 mg at 02/23/16 0853  . cholecalciferol (VITAMIN D) tablet 1,000 Units  1,000 Units Oral Daily Rozetta Nunnery, NP    1,000 Units at 02/23/16 0853  . feeding supplement (ENSURE ENLIVE) (ENSURE ENLIVE) liquid 237 mL  237 mL Oral Daily PRN Jenne Campus, MD      . hydrOXYzine (ATARAX/VISTARIL) tablet 25 mg  25 mg Oral Q6H PRN Rozetta Nunnery, NP   25 mg at 02/22/16 2235  . insulin aspart (novoLOG) injection 0-15 Units  0-15 Units Subcutaneous TID PC & HS Laverle Hobby, PA-C   11 Units at 02/23/16 1159  . insulin aspart protamine- aspart (NOVOLOG MIX 70/30) injection 10 Units  10 Units Subcutaneous BID WC Laverle Hobby, PA-C   10 Units at 02/23/16 0857  . loperamide (IMODIUM) capsule 2-4 mg  2-4 mg Oral PRN Rozetta Nunnery, NP      . LORazepam (ATIVAN) tablet 1 mg  1 mg Oral Q6H PRN Rozetta Nunnery, NP      . magnesium hydroxide (MILK OF MAGNESIA) suspension 30 mL  30 mL Oral Daily PRN Rozetta Nunnery, NP      . metFORMIN (GLUCOPHAGE) tablet 850 mg  850 mg Oral BID WC Laverle Hobby, PA-C   850 mg at 02/23/16 0854  . ondansetron (ZOFRAN-ODT) disintegrating tablet 4 mg  4 mg Oral Q6H PRN Rozetta Nunnery, NP      . sertraline (ZOLOFT) tablet 25 mg  25 mg Oral Daily Nanci Pina, FNP   25 mg at 02/23/16 0853  . simvastatin (ZOCOR) tablet 20 mg  20 mg Oral Daily Rozetta Nunnery, NP   20 mg at 02/23/16 0853  . traZODone (DESYREL) tablet 50 mg  50 mg Oral QHS,MR X 1 Rozetta Nunnery, NP   50 mg at 02/22/16 2235    Lab Results:  Results for orders placed or performed during the hospital encounter of 02/21/16 (from the past 48 hour(s))  Glucose, capillary     Status: Abnormal   Collection Time: 02/21/16  5:16 PM  Result Value Ref Range   Glucose-Capillary 354 (H) 65 - 99 mg/dL   Comment 1 Notify RN    Comment 2 Document in Chart   Glucose, capillary     Status: Abnormal   Collection Time: 02/21/16  9:13 PM  Result Value Ref Range   Glucose-Capillary 325 (H) 65 - 99 mg/dL   Comment 1 Notify RN    Comment 2 Document in Chart   Glucose, capillary     Status: Abnormal   Collection Time: 02/22/16  5:59 AM  Result Value  Ref Range   Glucose-Capillary 301 (H) 65 - 99 mg/dL  Glucose, capillary     Status: Abnormal   Collection Time: 02/22/16 11:50 AM  Result Value Ref Range   Glucose-Capillary 262 (H) 65 - 99 mg/dL  Glucose, capillary     Status: Abnormal   Collection Time: 02/22/16  5:00 PM  Result Value Ref Range   Glucose-Capillary 232 (H)  65 - 99 mg/dL  Glucose, capillary     Status: Abnormal   Collection Time: 02/22/16  8:16 PM  Result Value Ref Range   Glucose-Capillary 313 (H) 65 - 99 mg/dL  Glucose, capillary     Status: Abnormal   Collection Time: 02/23/16  6:12 AM  Result Value Ref Range   Glucose-Capillary 169 (H) 65 - 99 mg/dL  Basic metabolic panel     Status: Abnormal   Collection Time: 02/23/16  6:17 AM  Result Value Ref Range   Sodium 138 135 - 145 mmol/L   Potassium 3.8 3.5 - 5.1 mmol/L   Chloride 103 101 - 111 mmol/L   CO2 27 22 - 32 mmol/L   Glucose, Bld 176 (H) 65 - 99 mg/dL   BUN 14 6 - 20 mg/dL   Creatinine, Ser 1.06 0.61 - 1.24 mg/dL   Calcium 8.9 8.9 - 10.3 mg/dL   GFR calc non Af Amer >60 >60 mL/min   GFR calc Af Amer >60 >60 mL/min    Comment: (NOTE) The eGFR has been calculated using the CKD EPI equation. This calculation has not been validated in all clinical situations. eGFR's persistently <60 mL/min signify possible Chronic Kidney Disease.    Anion gap 8 5 - 15    Comment: Performed at [facility]  Urinalysis, Routine w reflex microscopic     Status: None   Collection Time: 02/23/16  6:40 AM  Result Value Ref Range   Color, Urine YELLOW YELLOW   APPearance CLEAR CLEAR   Specific Gravity, Urine 1.011 1.005 - 1.030   pH 5.0 5.0 - 8.0   Glucose, UA NEGATIVE NEGATIVE mg/dL   Hgb urine dipstick NEGATIVE NEGATIVE   Bilirubin Urine NEGATIVE NEGATIVE   Ketones, ur NEGATIVE NEGATIVE mg/dL   Protein, ur NEGATIVE NEGATIVE mg/dL   Nitrite NEGATIVE NEGATIVE   Leukocytes, UA NEGATIVE NEGATIVE    Comment: Performed at [facility]   Glucose, capillary     Status: Abnormal   Collection Time: 02/23/16 11:48 AM  Result Value Ref Range   Glucose-Capillary 322 (H) 65 - 99 mg/dL   Comment 1 Notify RN     Blood Alcohol level:  Lab Results  Component Value Date   ETH 7 (H) 02/19/2016   ETH <5 66/59/9357    Metabolic Disorder Labs: Lab Results  Component Value Date   HGBA1C 13.1 (H) 09/21/2015   MPG 329 09/21/2015   MPG 303 08/31/2014   No results found for: PROLACTIN Lab Results  Component Value Date   CHOL 265 (H) 08/31/2014   TRIG 351 (H) 08/31/2014   HDL 49 08/31/2014   CHOLHDL 5.4 08/31/2014   VLDL 70 (H) 08/31/2014   LDLCALC 146 (H) 08/31/2014   LDLCALC 163 (H) 05/07/2014    Physical Findings: AIMS: Facial and Oral Movements Muscles of Facial Expression: None, normal Lips and Perioral Area: None, normal Jaw: None, normal Tongue: None, normal,Extremity Movements Upper (arms, wrists, hands, fingers): None, normal Lower (legs, knees, ankles, toes): None, normal, Trunk Movements Neck, shoulders, hips: None, normal, Overall Severity Severity of abnormal movements (highest score from questions above): None, normal Incapacitation due to abnormal movements: None, normal Patient's awareness of abnormal movements (rate only patient's report): No Awareness, Dental Status Current problems with teeth and/or dentures?: No Does patient usually wear dentures?: No  CIWA:  CIWA-Ar Total: 0 COWS:     Musculoskeletal: Strength & Muscle Tone: within normal limits- no distal tremors, no diaphoresis, no acute  distress or restlessness  Gait & Station: normal Patient leans: N/A  Psychiatric Specialty Exam: Physical Exam   ROS  denies headache, denies visual disturbances, no nausea or vomiting,  Blood pressure 121/87, pulse 96, temperature 97.9 F (36.6 C), temperature source Oral, resp. rate 18, height 5' 7"  (1.702 m), weight 96.2 kg (212 lb).Body mass index is 33.2 kg/m.  General Appearance: improved  grooming   Eye Contact:  Good  Speech:  Normal Rate  Volume:  Normal  Mood:  improved and currently presents euthymic   Affect:  Appropriate and reactive   Thought Process:  Linear  Orientation:  Other:  fully alert and attentive   Thought Content:  denies hallucinations, no delusions, not internally preoccupied   Suicidal Thoughts:  No- denies any suicidal or self injurious ideations , denies any homicidal or violent ideations  Homicidal Thoughts:  No  Memory:  recent and remote grossly intact   Judgement:  Other:  improving   Insight:  improving   Psychomotor Activity:  Normal  Concentration:  Concentration: Good and Attention Span: Good  Recall:  Good  Fund of Knowledge:  Good  Language:  Good  Akathisia:  Negative  Handed:  Right  AIMS (if indicated):     Assets:  Desire for Improvement Resilience  ADL's:  Intact  Cognition:  WNL  Sleep:  Number of Hours: 6.25   Assessment - patient presents improved compared to admission . At this time reports improved mood and presenting with brighter/ full range of affect. Denies any SI at this time. He is not currently presenting with symptoms of alcohol WDL- no tremors, no diaphoresis, no restlessness, no headache, no visual disturbances, no tachycardia. BP is elevated- patient has history of HTN. Tolerating Zoloft trial well thus far.  Treatment Plan Summary: Daily contact with patient to assess and evaluate symptoms and progress in treatment, Medication management, Plan inpatient admission  and medications as below Encourage ongoing group and milieu participation to work on coping skills and symptom reduction  Continue to encourage efforts to work on sobriety, relapse prevention Treatment team working on disposition [place] as per CIWA protocol to address potential symptoms of alcohol WDL Continue Zoloft 25 mgrs QDAY for depression , anxiety Continue Simvastatin for hypercholesterolemia, Amlodipine for HTN, and  Diabetic management  Recheck BMP in AM to follow up on hyponatremia and renal function Treatment team working on disposition planning options  Nanci Pina, [place] 02/23/2016, 3:02 PM   Agree with NP Progress Note

## 2016-02-23 NOTE — Progress Notes (Signed)
Recreation Therapy Notes  Animal-Assisted Activity (AAA) Program Checklist/Progress Notes Patient Eligibility Criteria Checklist & Daily Group note for Rec TxIntervention  Date: 01.16.2018 Time: 2:45pm Location: [address]    AAA/T Program Assumption of Risk Form signed by Patient/ or Parent Legal Guardian Yes  Patient is free of allergies or sever asthma Yes  Patient reports no fear of animals Yes  Patient reports no history of cruelty to animals Yes  Patient understands his/her participation is voluntary Yes  Behavioral Response: Did not attend.   Laureen Ochs Ailee Pates, LRT/CTRS        Keili Hasten L 02/23/2016 3:06 PM

## 2016-02-23 NOTE — Progress Notes (Signed)
D: Pt A & O X4. Presents flat on initial approach but brightens up and forwards on interactions. Denies SI, HI, AVH and pain at this time. Rates his depression, hopelessness and anxiety all 0/10. Reported he's sleeping good at night with good appetite, normal energy and good concentration level on self inventory sheet. Denies active withdrawal symptoms at this time. Pt's goal is "staying clean and focus on my health and home plan". A: Medications administered as prescribed. Emotional support and availability provided to pt. Encouraged pt to attend unit groups, comply with treatment regimen and to voice concerns. Q 15 minutes safety checks maintained without outburst or self harm gestures to note thus far.  R: Pt receptive to care. Took his medications without issues. Denies adverse drug reactions when assessed. Tolerated all PO intake well. Remains safe on and off unit.

## 2016-02-24 DIAGNOSIS — F1290 Cannabis use, unspecified, uncomplicated: Secondary | ICD-10-CM

## 2016-02-24 DIAGNOSIS — F332 Major depressive disorder, recurrent severe without psychotic features: Principal | ICD-10-CM

## 2016-02-24 DIAGNOSIS — F1490 Cocaine use, unspecified, uncomplicated: Secondary | ICD-10-CM

## 2016-02-24 DIAGNOSIS — Z8249 Family history of ischemic heart disease and other diseases of the circulatory system: Secondary | ICD-10-CM

## 2016-02-24 DIAGNOSIS — Z9889 Other specified postprocedural states: Secondary | ICD-10-CM

## 2016-02-24 DIAGNOSIS — Z79899 Other long term (current) drug therapy: Secondary | ICD-10-CM

## 2016-02-24 DIAGNOSIS — Z833 Family history of diabetes mellitus: Secondary | ICD-10-CM

## 2016-02-24 LAB — GLUCOSE, CAPILLARY
Glucose-Capillary: 225 mg/dL — ABNORMAL HIGH (ref 65–99)
Glucose-Capillary: 246 mg/dL — ABNORMAL HIGH (ref 65–99)
Glucose-Capillary: 290 mg/dL — ABNORMAL HIGH (ref 65–99)
Glucose-Capillary: 184 mg/dL — ABNORMAL HIGH (ref 65–99)

## 2016-02-24 LAB — HEMOGLOBIN A1C
Hgb A1c MFr Bld: 12 % — ABNORMAL HIGH (ref 4.8–5.6)
Mean Plasma Glucose: 298 mg/dL

## 2016-02-24 NOTE — Progress Notes (Signed)
Pt reports he is doing well and voices no needs or concerns at this time.  He denies SI/HI/AVH.  He has been in the dayroom most of the evening and attended evening group.  He states he is ready for discharge and plans to go home and follow up with [facility].  His CBG was down to 206 tonight and received 5 units of Novolog at bedtime.  Pt has been pleasant and cooperative.  He makes his needs known to staff.  Support and encouragement offered.  Discharge plans are in process.  Safety maintained with q15 minute checks.

## 2016-02-24 NOTE — Progress Notes (Signed)
The patient shared in Wrap-Up group last evening that he had a "great day" since he was feeling better than the previous day. He states that he will be discharged on Wednesday or Thursday. In terms of the theme for the day, his wellness strategy will be to go to [facility] Following discharge and that he intends to eat in moderation.

## 2016-02-24 NOTE — Progress Notes (Signed)
Inpatient Diabetes Program Recommendations  AACE/ADA: New Consensus Statement on Inpatient Glycemic Control (2015)  Target Ranges:  Prepandial:   less than 140 mg/dL      Peak postprandial:   less than 180 mg/dL (1-2 hours)      Critically ill patients:  140 - 180 mg/dL   Results for Jonathan Austin, Jonathan Austin (MRN [id number]) as of 02/24/2016 07:18  Ref. Range 02/23/2016 06:12 02/23/2016 11:48 02/23/2016 17:23 02/23/2016 20:53  Glucose-Capillary Latest Ref Range: 65 - 99 mg/dL 169 (H) 322 (H) 283 (H) 206 (H)   Results for Jonathan Austin, Jonathan Austin (MRN [id number]) as of 02/24/2016 07:18  Ref. Range 02/24/2016 06:07  Glucose-Capillary Latest Ref Range: 65 - 99 mg/dL 225 (H)    Admit with: Major Depressive Disorder, Recurrent, Severe Without Psychotic Features  History: DM, Substance Abuse, CKD  Home DM Meds: 70/30 Insulin- 9 units AM/ 7 units PM  Current Insulin Orders: 70/30 Insulin- 10 units BID                                       Novolog Moderate Correction Scale/ SSI (0-15 units) TID AC       MD- If patient not discharged home today, Please consider the following in-hospital insulin adjustments:  1. Increase 70/30 Insulin to 15 units BID with meals  2. Increase Novolog Correction Scale/ SSI to Resistant scale (0-20 units) TID AC + HS      --Will follow patient during hospitalization--  Wyn Quaker RN, MSN, CDE Diabetes Coordinator Inpatient Glycemic Control Team Team Pager: [phone] (8a-5p)

## 2016-02-24 NOTE — BHH Suicide Risk Assessment (Signed)
[place] INPATIENT:  Family/Significant Other Suicide Prevention Education  Suicide Prevention Education:  Patient Refusal for Family/Significant Other Suicide Prevention Education: The patient Jonathan Austin has refused to provide written consent for family/significant other to be provided Family/Significant Other Suicide Prevention Education during admission and/or prior to discharge.  Physician notified.  SPE completed with pt, as pt refused to consent to family contact. SPI pamphlet provided to pt and pt was encouraged to share information with support network, ask questions, and talk about any concerns relating to SPE. Pt denies access to guns/firearms and verbalized understanding of information provided. Mobile Crisis information also provided to pt.   Jaziah Goeller N Smart LCSW 02/24/2016, 8:46 AM

## 2016-02-24 NOTE — BHH Group Notes (Signed)
[facility] LCSW Aftercare Discharge Planning Group Note   02/24/2016 11:27 AM  Participation Quality:  Appropriate   Mood/Affect:  Appropriate  Depression Rating:  2  Anxiety Rating:  2  Thoughts of Suicide:  No Will you contract for safety?   NA  Current AVH:  No  Plan for Discharge/Comments:  Pt plans to d/c on Thursday. He plans to return home and follow-up at [facility]-resume services (SAIOP and medication management).   Transportation Means: d/c tomorrow likely  Supports: some family supports;   Elyas Villamor N Smart LCSW

## 2016-02-24 NOTE — Progress Notes (Signed)
DAR NOTE: Pt present with flat affect and depressed mood in the unit. Pt has been isolating himself and has been bed most of the time. Pt denies physical pain, took all his meds as scheduled. As per self inventory, pt had a good night sleep, good appetite, normal energy, and good concentration. Pt rate depression at 0, hopeless ness at 0, and anxiety at 0. Pt's safety ensured with 15 minute and environmental checks. Pt currently denies SI/HI and A/V hallucinations. Pt verbally agrees to seek staff if SI/HI or A/VH occurs and to consult with staff before acting on these thoughts. Will continue POC.

## 2016-02-24 NOTE — Progress Notes (Signed)
[facility] MD Progress Note  02/24/2016 4:49 PM Jonathan Austin  MRN:  [id number]  Subjective: Jonathan Austin states "I'm feeling a lot better, just trying to have a good plan for further treatment after discharge. I will like to go the [facility] here in [place], [place]".  Objective : I have discussed case with treatment team and have met with patient . Patient presents improved mood compared to admission- states he is feeling better, less depressed & does present with a fuller range of affect.Currently denies any suicidal ideations. He is not presenting with withdrawal symptoms- no significant distal tremors, no diaphoresis, no psychomotor agitation, denies visual disturbances.Tolerating Zoloft trial well. He plans to continue treatment at the [facility] after discharge.  Principal Problem: Severe recurrent major depression without psychotic features ([place]) Diagnosis:   Patient Active Problem List   Diagnosis Date Noted  . Severe recurrent major depression without psychotic features ([place]) [F33.2] 02/21/2016  . Sepsis ([place]) secondary to CAP [A41.9] 01/20/2016  . Influenza A [J10.1] 01/20/2016  . AKI (acute kidney injury) ([place]) [N17.9] 01/20/2016  . CKD (chronic kidney disease), stage II [N18.2] 01/20/2016  . Hyperkalemia [E87.5] 01/20/2016  . CAP (community acquired pneumonia) [J18.9] 01/18/2016  . Hypotension [I95.9] 01/18/2016  . Hyperglycemia [R73.9] 09/20/2015  . HTN (hypertension) [I10] 09/20/2015  . HLD (hyperlipidemia) [E78.5] 09/20/2015  . Substance abuse [F19.10] 08/30/2014  . Substance induced mood disorder ([place]) [F19.94] 05/07/2014  . Cocaine abuse with cocaine-induced mood disorder ([place]) [F14.14]   . Alcohol abuse [F10.10]   . Fever [R50.9] 02/12/2014  . Sinus tachycardia [R00.0] 02/12/2014  . Marijuana abuse [F12.10] 06/28/2013  . Leukocytosis [D72.829] 06/28/2013  . Hyponatremia [E87.1] 06/28/2013  . Diabetes mellitus type II, uncontrolled ([place]) [E11.65] 12/09/2012   Total Time spent with patient:  20 minutes  Past Medical History:  Past Medical History:  Diagnosis Date  . Cellulitis 06/28/2013  . Cocaine abuse 06/28/2013  . Cocaine abuse with cocaine-induced mood disorder ([place])   . Depression   . Diabetes mellitus   . High cholesterol   . Hypertension   . Scrotal infection 06/28/2013  . Sleep apnea   . Suicidal ideations     Past Surgical History:  Procedure Laterality Date  . TONSILLECTOMY     Family History:  Family History  Problem Relation Age of Onset  . Hypertension Mother   . Hypertension Father   . Diabetes Father    Social History:  History  Alcohol Use  . 1.8 oz/week  . 1 Glasses of wine, 1 Cans of beer, 1 Shots of liquor per week    Comment: Occasionally     History  Drug Use  . Types: Cocaine, Marijuana    Social History   Social History  . Marital status: Married    Spouse name: N/A  . Number of children: N/A  . Years of education: N/A   Social History Main Topics  . Smoking status: Never Smoker  . Smokeless tobacco: Never Used  . Alcohol use 1.8 oz/week    1 Glasses of wine, 1 Cans of beer, 1 Shots of liquor per week     Comment: Occasionally  . Drug use:     Types: Cocaine, Marijuana  . Sexual activity: Yes   Other Topics Concern  . None   Social History Narrative  . None   Additional Social History:   Sleep: Good  Appetite:  Good  Current Medications: Current Facility-Administered Medications  Medication Dose Route Frequency Provider Last Rate Last Dose  . acetaminophen (  TYLENOL) tablet 650 mg  650 mg Oral Q6H PRN Rozetta Nunnery, NP      . alum & mag hydroxide-simeth (MAALOX/MYLANTA) 200-200-20 MG/5ML suspension 30 mL  30 mL Oral Q4H PRN Rozetta Nunnery, NP      . amLODipine (NORVASC) tablet 5 mg  5 mg Oral Daily Rozetta Nunnery, NP   5 mg at 02/24/16 0814  . cholecalciferol (VITAMIN D) tablet 1,000 Units  1,000 Units Oral Daily Rozetta Nunnery, NP   1,000 Units at 02/24/16 [phone]  . feeding supplement (ENSURE ENLIVE) (ENSURE  ENLIVE) liquid 237 mL  237 mL Oral Daily PRN Myer Peer Chukwuemeka Artola, MD      . insulin aspart (novoLOG) injection 0-15 Units  0-15 Units Subcutaneous TID PC & HS Laverle Hobby, PA-C   5 Units at 02/24/16 1215  . insulin aspart protamine- aspart (NOVOLOG MIX 70/30) injection 10 Units  10 Units Subcutaneous BID WC Laverle Hobby, PA-C   10 Units at 02/24/16 [phone]  . magnesium hydroxide (MILK OF MAGNESIA) suspension 30 mL  30 mL Oral Daily PRN Rozetta Nunnery, NP      . metFORMIN (GLUCOPHAGE) tablet 850 mg  850 mg Oral BID WC Laverle Hobby, PA-C   850 mg at 02/24/16 0814  . sertraline (ZOLOFT) tablet 25 mg  25 mg Oral Daily Nanci Pina, FNP   25 mg at 02/24/16 0814  . simvastatin (ZOCOR) tablet 20 mg  20 mg Oral Daily Rozetta Nunnery, NP   20 mg at 02/24/16 0814  . traZODone (DESYREL) tablet 50 mg  50 mg Oral QHS,MR X 1 Rozetta Nunnery, NP   50 mg at 02/23/16 2133    Lab Results:  Results for orders placed or performed during the hospital encounter of 02/21/16 (from the past 48 hour(s))  Glucose, capillary     Status: Abnormal   Collection Time: 02/22/16  5:00 PM  Result Value Ref Range   Glucose-Capillary 232 (H) 65 - 99 mg/dL  Glucose, capillary     Status: Abnormal   Collection Time: 02/22/16  8:16 PM  Result Value Ref Range   Glucose-Capillary 313 (H) 65 - 99 mg/dL  Glucose, capillary     Status: Abnormal   Collection Time: 02/23/16  6:12 AM  Result Value Ref Range   Glucose-Capillary 169 (H) 65 - 99 mg/dL  Basic metabolic panel     Status: Abnormal   Collection Time: 02/23/16  6:17 AM  Result Value Ref Range   Sodium 138 135 - 145 mmol/L   Potassium 3.8 3.5 - 5.1 mmol/L   Chloride 103 101 - 111 mmol/L   CO2 27 22 - 32 mmol/L   Glucose, Bld 176 (H) 65 - 99 mg/dL   BUN 14 6 - 20 mg/dL   Creatinine, Ser 1.06 0.61 - 1.24 mg/dL   Calcium 8.9 8.9 - 10.3 mg/dL   GFR calc non Af Amer >60 >60 mL/min   GFR calc Af Amer >60 >60 mL/min    Comment: (NOTE) The eGFR has been calculated using the  CKD EPI equation. This calculation has not been validated in all clinical situations. eGFR's persistently <60 mL/min signify possible Chronic Kidney Disease.    Anion gap 8 5 - 15    Comment: Performed at [facility]  Hemoglobin A1c     Status: Abnormal   Collection Time: 02/23/16  6:17 AM  Result Value Ref Range   Hgb A1c MFr Bld 12.0 (  H) 4.8 - 5.6 %    Comment: (NOTE)         Pre-diabetes: 5.7 - 6.4         Diabetes: >6.4         Glycemic control for adults with diabetes: <7.0    Mean Plasma Glucose 298 mg/dL    Comment: (NOTE) Performed At: [facility] [place], [place] [id number] Lindon Romp MD [id number] Performed at [facility], [place] [address] [place], [place] [address]   Urinalysis, Routine w reflex microscopic     Status: None   Collection Time: 02/23/16  6:40 AM  Result Value Ref Range   Color, Urine YELLOW YELLOW   APPearance CLEAR CLEAR   Specific Gravity, Urine 1.011 1.005 - 1.030   pH 5.0 5.0 - 8.0   Glucose, UA NEGATIVE NEGATIVE mg/dL   Hgb urine dipstick NEGATIVE NEGATIVE   Bilirubin Urine NEGATIVE NEGATIVE   Ketones, ur NEGATIVE NEGATIVE mg/dL   Protein, ur NEGATIVE NEGATIVE mg/dL   Nitrite NEGATIVE NEGATIVE   Leukocytes, UA NEGATIVE NEGATIVE    Comment: Performed at [facility]  Glucose, capillary     Status: Abnormal   Collection Time: 02/23/16 11:48 AM  Result Value Ref Range   Glucose-Capillary 322 (H) 65 - 99 mg/dL   Comment 1 Notify RN   Glucose, capillary     Status: Abnormal   Collection Time: 02/23/16  5:23 PM  Result Value Ref Range   Glucose-Capillary 283 (H) 65 - 99 mg/dL  Glucose, capillary     Status: Abnormal   Collection Time: 02/23/16  8:53 PM  Result Value Ref Range   Glucose-Capillary 206 (H) 65 - 99 mg/dL  Glucose, capillary     Status: Abnormal   Collection Time: 02/24/16  6:07 AM  Result Value Ref Range   Glucose-Capillary 225 (H) 65 -  99 mg/dL   Comment 1 Notify RN   Glucose, capillary     Status: Abnormal   Collection Time: 02/24/16 12:13 PM  Result Value Ref Range   Glucose-Capillary 246 (H) 65 - 99 mg/dL    Blood Alcohol level:  Lab Results  Component Value Date   ETH 7 (H) 02/19/2016   ETH <5 24/82/5003    Metabolic Disorder Labs: Lab Results  Component Value Date   HGBA1C 12.0 (H) 02/23/2016   MPG 298 02/23/2016   MPG 329 09/21/2015   No results found for: PROLACTIN Lab Results  Component Value Date   CHOL 265 (H) 08/31/2014   TRIG 351 (H) 08/31/2014   HDL 49 08/31/2014   CHOLHDL 5.4 08/31/2014   VLDL 70 (H) 08/31/2014   LDLCALC 146 (H) 08/31/2014   LDLCALC 163 (H) 05/07/2014    Physical Findings: AIMS: Facial and Oral Movements Muscles of Facial Expression: None, normal Lips and Perioral Area: None, normal Jaw: None, normal Tongue: None, normal,Extremity Movements Upper (arms, wrists, hands, fingers): None, normal Lower (legs, knees, ankles, toes): None, normal, Trunk Movements Neck, shoulders, hips: None, normal, Overall Severity Severity of abnormal movements (highest score from questions above): None, normal Incapacitation due to abnormal movements: None, normal Patient's awareness of abnormal movements (rate only patient's report): No Awareness, Dental Status Current problems with teeth and/or dentures?: No Does patient usually wear dentures?: No  CIWA:  CIWA-Ar Total: 0 COWS:     Musculoskeletal: Strength & Muscle Tone: within normal limits- no distal tremors, no diaphoresis, no acute distress or restlessness  Gait & Station: normal  Patient leans: N/A  Psychiatric Specialty Exam: Physical Exam: Nurses notes & vital signs reviewed.  ROS: Denies headache, denies visual disturbances, no nausea or vomiting,  Blood pressure 131/78, pulse 89, temperature 97.9 F (36.6 C), temperature source Oral, resp. rate 16, height 5' 7"  (1.702 m), weight 96.2 kg (212 lb).Body mass index is 33.2  kg/m.  General Appearance: improved grooming   Eye Contact:  Good  Speech:  Normal Rate  Volume:  Normal  Mood:  improved and currently presents euthymic   Affect:  Appropriate and reactive   Thought Process:  Linear  Orientation:  Other:  fully alert and attentive   Thought Content:  denies hallucinations, no delusions, not internally preoccupied   Suicidal Thoughts:  No- denies any suicidal or self injurious ideations , denies any homicidal or violent ideations  Homicidal Thoughts:  No  Memory:  recent and remote grossly intact   Judgement:  Other:  improving   Insight:  improving   Psychomotor Activity:  Normal  Concentration:  Concentration: Good and Attention Span: Good  Recall:  Good  Fund of Knowledge:  Good  Language:  Good  Akathisia:  Negative  Handed:  Right  AIMS (if indicated):     Assets:  Desire for Improvement Resilience  ADL's:  Intact  Cognition:  WNL  Sleep:  Number of Hours: 6.25   Assessment - Patient presents improved compared to admission. At this time reports improved mood and presenting with brighter/ full range of affect. Denies any SI at this time. He is not currently presenting with symptoms of alcohol WDL- no tremors, no diaphoresis, no restlessness, no headache, no visual disturbances, no tachycardia. BP is elevated- patient has history of HTN. Tolerating Zoloft trial well thus far.  Treatment Plan Summary: Daily contact with patient to assess and evaluate symptoms and progress in treatment, Medication management, Plan inpatient admission  and medications as below Encourage ongoing group and milieu participation to work on coping skills and symptom reduction  Continue to encourage efforts to work on sobriety, relapse prevention Treatment team working on disposition [place] as per CIWA protocol to address potential symptoms of alcohol WDL Continue Zoloft 25 mgrs QDAY for depression , anxiety Continue Simvastatin for  hypercholesterolemia, Amlodipine for HTN, and Diabetic management  Recheck BMP in AM to follow up on hyponatremia and renal function, result reviewed, NA level at 138, improved. Treatment team working on disposition planning options. No changes made on this plan of care, continue with treatment recommendation.   Encarnacion Slates, NP, PMHNP, FNP-BC 02/24/2016, 4:49 PMPatient ID: Jonathan Austin, male   DOB: Jan 10, 1966, 51 y.o.   MRN: [id number] Agree with NP Progress  Note

## 2016-02-25 LAB — GLUCOSE, CAPILLARY
GLUCOSE-CAPILLARY: 188 mg/dL — AB (ref 65–99)
GLUCOSE-CAPILLARY: 263 mg/dL — AB (ref 65–99)
GLUCOSE-CAPILLARY: 262 mg/dL — AB (ref 65–99)
Glucose-Capillary: 249 mg/dL — ABNORMAL HIGH (ref 65–99)

## 2016-02-25 MED ORDER — SERTRALINE HCL 50 MG PO TABS
50.0000 mg | ORAL_TABLET | Freq: Every day | ORAL | Status: DC
  Administered 2016-02-26: 50 mg via ORAL
  Filled 2016-02-25 (×2): qty 1
  Filled 2016-02-25: qty 7

## 2016-02-25 NOTE — Progress Notes (Signed)
[facility] MD Progress Note  02/25/2016 1:48 PM Jonathan Austin  MRN:  [id number]  Subjective: Patient reports he continues to progress, and feels he is doing significantly better than prior to admission . As he improves he is focusing more on discharge planning .  He denies medication side effects.   Objective : I have discussed case with treatment team and have met with patient . Patient is improving compared to admission, and presents with a significantly improved mood and range of affect . Chart RN Notes indicate that he has tended to be isolative, keep to self, and men, but indicate he has been minimizing depression and presenting with improved range of affect. At this time he is not presenting with any significant withdrawal symptoms- no tremors, no diaphoresis, no visual disturbances, no restlessness, vitals are stable . He is pleasant, cooperative on unit, with no disurptive or agitated behaviors . He is tolerating medications well, and denies side effects ( low dose Zoloft, Trazodone  PRNs )   Principal Problem: Severe recurrent major depression without psychotic features ([place]) Diagnosis:   Patient Active Problem List   Diagnosis Date Noted  . Severe recurrent major depression without psychotic features ([place]) [F33.2] 02/21/2016  . Sepsis ([place]) secondary to CAP [A41.9] 01/20/2016  . Influenza A [J10.1] 01/20/2016  . AKI (acute kidney injury) ([place]) [N17.9] 01/20/2016  . CKD (chronic kidney disease), stage II [N18.2] 01/20/2016  . Hyperkalemia [E87.5] 01/20/2016  . CAP (community acquired pneumonia) [J18.9] 01/18/2016  . Hypotension [I95.9] 01/18/2016  . Hyperglycemia [R73.9] 09/20/2015  . HTN (hypertension) [I10] 09/20/2015  . HLD (hyperlipidemia) [E78.5] 09/20/2015  . Substance abuse [F19.10] 08/30/2014  . Substance induced mood disorder ([employer]) [F19.94] 05/07/2014  . Cocaine abuse with cocaine-induced mood disorder ([place]) [F14.14]   . Alcohol abuse [F10.10]   . Fever [R50.9] 02/12/2014   . Sinus tachycardia [R00.0] 02/12/2014  . Marijuana abuse [F12.10] 06/28/2013  . Leukocytosis [D72.829] 06/28/2013  . Hyponatremia [E87.1] 06/28/2013  . Diabetes mellitus type II, uncontrolled ([place]) [E11.65] 12/09/2012   Total Time spent with patient: 20 minutes  Past Medical History:  Past Medical History:  Diagnosis Date  . Cellulitis 06/28/2013  . Cocaine abuse 06/28/2013  . Cocaine abuse with cocaine-induced mood disorder ([place])   . Depression   . Diabetes mellitus   . High cholesterol   . Hypertension   . Scrotal infection 06/28/2013  . Sleep apnea   . Suicidal ideations     Past Surgical History:  Procedure Laterality Date  . TONSILLECTOMY     Family History:  Family History  Problem Relation Age of Onset  . Hypertension Mother   . Hypertension Father   . Diabetes Father    Social History:  History  Alcohol Use  . 1.8 oz/week  . 1 Glasses of wine, 1 Cans of beer, 1 Shots of liquor per week    Comment: Occasionally     History  Drug Use  . Types: Cocaine, Marijuana    Social History   Social History  . Marital status: Married    Spouse name: N/A  . Number of children: N/A  . Years of education: N/A   Social History Main Topics  . Smoking status: Never Smoker  . Smokeless tobacco: Never Used  . Alcohol use 1.8 oz/week    1 Glasses of wine, 1 Cans of beer, 1 Shots of liquor per week     Comment: Occasionally  . Drug use: Yes    Types: Cocaine, Marijuana  .  Sexual activity: Yes   Other Topics Concern  . None   Social History Narrative  . None   Additional Social History:   Sleep: Good  Appetite:  Good  Current Medications: Current Facility-Administered Medications  Medication Dose Route Frequency Provider Last Rate Last Dose  . acetaminophen (TYLENOL) tablet 650 mg  650 mg Oral Q6H PRN Rozetta Nunnery, NP      . alum & mag hydroxide-simeth (MAALOX/MYLANTA) 200-200-20 MG/5ML suspension 30 mL  30 mL Oral Q4H PRN Rozetta Nunnery, NP      .  amLODipine (NORVASC) tablet 5 mg  5 mg Oral Daily Rozetta Nunnery, NP   5 mg at 02/25/16 0803  . cholecalciferol (VITAMIN D) tablet 1,000 Units  1,000 Units Oral Daily Rozetta Nunnery, NP   1,000 Units at 02/25/16 0804  . feeding supplement (ENSURE ENLIVE) (ENSURE ENLIVE) liquid 237 mL  237 mL Oral Daily PRN Myer Peer , MD      . insulin aspart (novoLOG) injection 0-15 Units  0-15 Units Subcutaneous TID PC & HS Laverle Hobby, PA-C   3 Units at 02/25/16 1326  . insulin aspart protamine- aspart (NOVOLOG MIX 70/30) injection 10 Units  10 Units Subcutaneous BID WC Laverle Hobby, PA-C   10 Units at 02/25/16 0805  . magnesium hydroxide (MILK OF MAGNESIA) suspension 30 mL  30 mL Oral Daily PRN Rozetta Nunnery, NP      . metFORMIN (GLUCOPHAGE) tablet 850 mg  850 mg Oral BID WC Laverle Hobby, PA-C   850 mg at 02/25/16 0804  . sertraline (ZOLOFT) tablet 25 mg  25 mg Oral Daily Nanci Pina, FNP   25 mg at 02/25/16 0804  . simvastatin (ZOCOR) tablet 20 mg  20 mg Oral Daily Rozetta Nunnery, NP   20 mg at 02/25/16 0807  . traZODone (DESYREL) tablet 50 mg  50 mg Oral QHS,MR X 1 Rozetta Nunnery, NP   50 mg at 02/24/16 2105    Lab Results:  Results for orders placed or performed during the hospital encounter of 02/21/16 (from the past 48 hour(s))  Glucose, capillary     Status: Abnormal   Collection Time: 02/23/16  5:23 PM  Result Value Ref Range   Glucose-Capillary 283 (H) 65 - 99 mg/dL  Glucose, capillary     Status: Abnormal   Collection Time: 02/23/16  8:53 PM  Result Value Ref Range   Glucose-Capillary 206 (H) 65 - 99 mg/dL  Glucose, capillary     Status: Abnormal   Collection Time: 02/24/16  6:07 AM  Result Value Ref Range   Glucose-Capillary 225 (H) 65 - 99 mg/dL   Comment 1 Notify RN   Glucose, capillary     Status: Abnormal   Collection Time: 02/24/16 12:13 PM  Result Value Ref Range   Glucose-Capillary 246 (H) 65 - 99 mg/dL  Glucose, capillary     Status: Abnormal   Collection Time:  02/24/16  5:15 PM  Result Value Ref Range   Glucose-Capillary 290 (H) 65 - 99 mg/dL   Comment 1 Notify RN    Comment 2 Document in Chart   Glucose, capillary     Status: Abnormal   Collection Time: 02/24/16  8:28 PM  Result Value Ref Range   Glucose-Capillary 184 (H) 65 - 99 mg/dL  Glucose, capillary     Status: Abnormal   Collection Time: 02/25/16  6:33 AM  Result Value Ref Range   Glucose-Capillary 249 (  H) 65 - 99 mg/dL  Glucose, capillary     Status: Abnormal   Collection Time: 02/25/16 12:12 PM  Result Value Ref Range   Glucose-Capillary 188 (H) 65 - 99 mg/dL   Comment 1 Notify RN    Comment 2 Document in Chart     Blood Alcohol level:  Lab Results  Component Value Date   ETH 7 (H) 02/19/2016   ETH <5 29/51/8841    Metabolic Disorder Labs: Lab Results  Component Value Date   HGBA1C 12.0 (H) 02/23/2016   MPG 298 02/23/2016   MPG 329 09/21/2015   No results found for: PROLACTIN Lab Results  Component Value Date   CHOL 265 (H) 08/31/2014   TRIG 351 (H) 08/31/2014   HDL 49 08/31/2014   CHOLHDL 5.4 08/31/2014   VLDL 70 (H) 08/31/2014   LDLCALC 146 (H) 08/31/2014   LDLCALC 163 (H) 05/07/2014    Physical Findings: AIMS: Facial and Oral Movements Muscles of Facial Expression: None, normal Lips and Perioral Area: None, normal Jaw: None, normal Tongue: None, normal,Extremity Movements Upper (arms, wrists, hands, fingers): None, normal Lower (legs, knees, ankles, toes): None, normal, Trunk Movements Neck, shoulders, hips: None, normal, Overall Severity Severity of abnormal movements (highest score from questions above): None, normal Incapacitation due to abnormal movements: None, normal Patient's awareness of abnormal movements (rate only patient's report): No Awareness, Dental Status Current problems with teeth and/or dentures?: No Does patient usually wear dentures?: No  CIWA:  CIWA-Ar Total: 0 COWS:     Musculoskeletal: Strength & Muscle Tone: within  normal limits- no distal tremors, no diaphoresis, no acute distress or restlessness  Gait & Station: normal Patient leans: N/A  Psychiatric Specialty Exam: Physical Exam: Nurses notes & vital signs reviewed.  ROS: Denies headache, denies visual disturbances, no nausea or vomiting,  Blood pressure 123/78, pulse (!) 105, temperature 98 F (36.7 C), temperature source Oral, resp. rate 16, height 5' 7" (1.702 m), weight 96.2 kg (212 lb).Body mass index is 33.2 kg/m.  General Appearance: improved grooming   Eye Contact:  Good  Speech:  Normal Rate  Volume:  Normal  Mood:  Improved, at this time minimizes depression  Affect:  More reactive   Thought Process:  Linear  Orientation:  Other:  fully alert and attentive   Thought Content:  denies hallucinations, no delusions, not internally preoccupied   Suicidal Thoughts:  No- denies any suicidal or self injurious ideations , denies any homicidal or violent ideations  Homicidal Thoughts:  No  Memory:  recent and remote grossly intact   Judgement:  Other:  improving   Insight:  improving   Psychomotor Activity:  Normal  Concentration:  Concentration: Good and Attention Span: Good  Recall:  Good  Fund of Knowledge:  Good  Language:  Good  Akathisia:  Negative  Handed:  Right  AIMS (if indicated):     Assets:  Desire for Improvement Resilience  ADL's:  Intact  Cognition:  WNL  Sleep:  Number of Hours: 6.75   Assessment - patient is improving compared to admission, and presents with an improved mood and range of affect . No current SI and more future oriented, looking forward to discharge soon,states he plans to follow up with [facility] . Denies medication side effects.  Treatment Plan Summary: Daily contact with patient to assess and evaluate symptoms and progress in treatment, Medication management, Plan inpatient admission  and medications as below Continue to encourage group and milieu participation to work  on coping  skills and symptom reduction Continue to encourage efforts to work on relapse prevention and early recovery Treatment team working on disposition planning - patient plans to follow at [facility] Increase Zoloft to 50 mgrs QDAY for depression, anxiety Continue Trazodone 50 mgrs QHS PRN for insomnia as needed   Continue Norvasc for HTN and Glucophage/Insulin for management of DM.  Neita Garnet, MD 02/25/2016, 1:48 PM   Patient ID: [name], male   DOB: 10/25/1965, 51 y.o.   MRN: [id number]

## 2016-02-25 NOTE — Plan of Care (Signed)
Problem: Medication: Goal: Compliance with prescribed medication regimen will improve Outcome: Progressing Pt has been compliant with medications tonight.    

## 2016-02-25 NOTE — Progress Notes (Signed)
D:   Pt presents with appropriate affect and pleasant mood.  He reports he had a "good" day and he "got my discharge plan together and everything."  Pt reports he will return home and go to "[facility] outpatient."  Pt denies SI/HI, denies hallucinations, denies pain.  Pt has been visible in milieu interacting with peers and staff appropriately.   A: Introduced self to pt.  Actively listened to pt and offered support and encouragement. Medications administered per order.  Q15 minutes safety checks completed.  R: Pt is safe on the unit.  Pt is compliant with medications.  Pt verbally contracts for safety.  Will continue to monitor and assess.

## 2016-02-25 NOTE — Progress Notes (Signed)
Patient ID: Jonathan Austin, male   DOB: Dec 08, 1965, 51 y.o.   MRN: [id number]  D) Pt affect and mood have been appropriate and cooperative on approach throughout this shift. Pt is positive for unit activities with minimal prompting. Pt c/o verbalized to this Probation officer. Pt bright and smiling stating that the "Glucophage is doing the trick", referring to CBG 188. Appetite adequate. A) Level 3 obs for safety, support and encouragement provided. Med ed reinforced. Pt reinforcement provided. R) Receptive.

## 2016-02-25 NOTE — BHH Group Notes (Signed)
[place] LCSW Group Therapy  02/25/2016 2:40 PM  Type of Therapy:  Group Therapy  Participation Level:  Active  Participation Quality:  Appropriate and Attentive  Affect:  Appropriate  Cognitive:  Alert and Appropriate  Insight:  Developing/Improving  Engagement in Therapy:  Engaged  Modes of Intervention:  Discussion, Exploration and Support  Summary of Progress/Problems:  Finding Balance in Life. Today's group focused on defining balance in one's own words, identifying things that can knock one off balance, and exploring healthy ways to maintain balance in life. Group members were asked to provide an example of a time when they felt off balance, describe how they handled that situation, and process healthier ways to regain balance in the future. Group members were asked to share the most important tool for maintaining balance that they learned while at [facility] and how they plan to apply this method after discharge. Patient identified continued participating in IOP program at [facility] as integral part of maintaining balance in his life.  Needs to balance work responsibilities/hours in order to attend regularly.   Edwyna Shell LCSW 02/25/16

## 2016-02-26 LAB — GLUCOSE, CAPILLARY
GLUCOSE-CAPILLARY: 174 mg/dL — AB (ref 65–99)
GLUCOSE-CAPILLARY: 229 mg/dL — AB (ref 65–99)

## 2016-02-26 MED ORDER — INSULIN ASPART PROT & ASPART (70-30 MIX) 100 UNIT/ML ~~LOC~~ SUSP: 10.0000 [IU] | Freq: Two times a day (BID) | SUBCUTANEOUS | 0 refills | Status: DC

## 2016-02-26 MED ORDER — AMLODIPINE BESYLATE 5 MG PO TABS: 5.0000 mg | ORAL_TABLET | Freq: Every day | ORAL | 0 refills | Status: DC

## 2016-02-26 MED ORDER — TRAZODONE HCL 50 MG PO TABS: 50.0000 mg | ORAL_TABLET | Freq: Every evening | ORAL | 0 refills | Status: DC | PRN

## 2016-02-26 MED ORDER — METFORMIN HCL 850 MG PO TABS: 850.0000 mg | ORAL_TABLET | Freq: Two times a day (BID) | ORAL | 0 refills | Status: DC

## 2016-02-26 MED ORDER — SIMVASTATIN 20 MG PO TABS: 20.0000 mg | ORAL_TABLET | Freq: Every day | ORAL | 0 refills | Status: DC

## 2016-02-26 MED ORDER — SERTRALINE HCL 50 MG PO TABS: 50.0000 mg | ORAL_TABLET | Freq: Every day | ORAL | 0 refills | Status: DC

## 2016-02-26 NOTE — BHH Suicide Risk Assessment (Signed)
[facility] Discharge Suicide Risk Assessment   Principal Problem: Severe recurrent major depression without psychotic features [facility]) Discharge Diagnoses:  Patient Active Problem List   Diagnosis Date Noted  . Severe recurrent major depression without psychotic features ([place]) [F33.2] 02/21/2016  . Sepsis ([place]) secondary to CAP [A41.9] 01/20/2016  . Influenza A [J10.1] 01/20/2016  . AKI (acute kidney injury) ([place]) [N17.9] 01/20/2016  . CKD (chronic kidney disease), stage II [N18.2] 01/20/2016  . Hyperkalemia [E87.5] 01/20/2016  . CAP (community acquired pneumonia) [J18.9] 01/18/2016  . Hypotension [I95.9] 01/18/2016  . Hyperglycemia [R73.9] 09/20/2015  . HTN (hypertension) [I10] 09/20/2015  . HLD (hyperlipidemia) [E78.5] 09/20/2015  . Substance abuse [F19.10] 08/30/2014  . Substance induced mood disorder ([place]) [F19.94] 05/07/2014  . Cocaine abuse with cocaine-induced mood disorder ([place]) [F14.14]   . Alcohol abuse [F10.10]   . Fever [R50.9] 02/12/2014  . Sinus tachycardia [R00.0] 02/12/2014  . Marijuana abuse [F12.10] 06/28/2013  . Leukocytosis [D72.829] 06/28/2013  . Hyponatremia [E87.1] 06/28/2013  . Diabetes mellitus type II, uncontrolled ([place]) [E11.65] 12/09/2012    Total Time spent with patient: 30 minutes  Musculoskeletal: Strength & Muscle Tone: within normal limits Gait & Station: normal Patient leans: N/A  Psychiatric Specialty Exam: Review of Systems  Constitutional: Negative.   HENT: Negative.   Eyes: Negative.   Respiratory: Negative.   Cardiovascular: Negative.   Gastrointestinal: Negative.   Genitourinary: Negative.   Musculoskeletal: Negative.   Skin: Negative.   Neurological: Negative.   Endo/Heme/Allergies: Negative.   Psychiatric/Behavioral: Negative.     Blood pressure 131/87, pulse 91, temperature 97.7 F (36.5 C), temperature source Oral, resp. rate 18, height 5\' 7"  (1.702 m), weight 96.2 kg (212 lb).Body mass index is 33.2 kg/m.  General Appearance:  Calm and cooperative. good relatedness. appropriate behavior. no evidence of withdrawals. He is not internally stimulated,.  Eye Contact::  Good  Speech:  Spontaneous, normal rate tone and volume. 409  Volume:  Normal  Mood:  Euthymic  Affect:  Appropriate and Full Range  Thought Process:  Goal Directed and Linear  Orientation:  Full (Time, Place, and Person)  Thought Content:  No thoughts of violence. No delusional theme. No negative rumination.   Suicidal Thoughts:  No  Homicidal Thoughts:  No  Memory:  WNL  Judgement:  Good  Insight:  Good  Psychomotor Activity:  Normal  Concentration:  Good  Recall:  Good  Fund of Knowledge:Good  Language: Good  Akathisia:  Negative  Handed:    AIMS (if indicated):     Assets:  Desire for Improvement Housing Intimacy Social Support  Sleep:  Number of Hours: 6  Cognition: WNL  ADL's:  Intact   Clinical Assessment::   51 yo AAM, married, lives with his family. Has grown kids and grand kids. Hospitalized on account of suicidal thoughts. This was in context of alcohol and cocaine intoxication. At presentation he was ruminating on the negative things in his life. He was preoccupied with his physical health, financial difficulties and impact of his legal issues on his ability to gain employment. At interview today, patient states that he is feeling better now that he has come off street drugs and alcohol. Says his mood is good again. He is no longer feeling depressed. He no longer has thoughts of self harm. He is looking forward to being with his family again. He elaborated on his plans to stay sober and maintain a positive path. No evidence of psychosis. No evidence of mania. No evidence of anxiety. No  thoughts of violence.  Nursing staff reports that he has been doing well. He has been interacting well with peers. He has been participating at unit groups. No behavioral issues. He has not voiced any suicidal or violent thoughts. He has been  maintaining normal biological rhythm and functions.  Patient was discussed at team today. Team agrees that he is back to his baseline.   Demographic Factors:  Male and Unemployed  Loss Factors: Legal issues and Financial problems/change in socioeconomic status  Historical Factors: Impulsivity  Risk Reduction Factors:   Responsible for children under 82 years of age, Sense of responsibility to family, Living with another person, especially a relative, Positive social support, Positive therapeutic relationship and Positive coping skills or problem solving skills  Continued Clinical Symptoms:  Anxiety and depression has resolved. He has come off substances smoothly   Cognitive Features That Contribute To Risk:  None    Suicide Risk:  Suicide risk at this time is low. He has been detoxed off psychoactive substances. Mood swings has stabilized. He is willing to engage with addiction treatment upon discharge. He is willing to adhere with current medications upon discharge. He has good support structure. Protective factors outweighs risk at this time.  Follow-up Information    [facility] Follow up on 02/29/2016.   Why:  Please resume your normal SAIOP group schedule (Mon/Weds/Fri)  upon discharge. Thank you.  Contact information: [address] [place], [place] [address] Phone: [phone] Fax: [phone]          Plan Of Care/Follow-up recommendations:   1. Continue current regimen 2. Mental health and addiction treatment.   [place], MD 02/26/2016, 3:27 PM

## 2016-02-26 NOTE — Progress Notes (Signed)
  [facility] Adult Case Management Discharge Plan :  Will you be returning to the same living situation after discharge:  Yes,  home At discharge, do you have transportation home?: Yes,  family member  Do you have the ability to pay for your medications: Yes,  mental health  Release of information consent forms completed and submitted to medical records by CSW.  Patient to Follow up at: Follow-up Information    [facility] Follow up on 02/29/2016.   Why:  Please resume your normal SAIOP group schedule (Mon/Weds/Fri)  upon discharge. Thank you.  Contact information: [address] [place], [place] [address] Phone: [phone] Fax: [phone]          Next level of care provider has access to [address] and Suicide Prevention discussed: Yes,  SPE completed with pt; pt declined to consent to family contact. SPI pamphlet and Mobile Crisis information provided.  Have you used any form of tobacco in the last 30 days? (Cigarettes, Smokeless Tobacco, Cigars, and/or Pipes): No  Has patient been referred to the Quitline?: N/A patient is not a smoker  Patient has been referred for addiction treatment: Yes  Bhavik Cabiness N Smart LCSW 02/26/2016, 11:11 AM

## 2016-02-26 NOTE — Progress Notes (Signed)
Nursing Progress Note: 7p-7a D: Pt currently presents with a pleasant/childlike/anxious affect and behavior. Pt states. Interacting appropriately with milieu. Pt reports good sleep with current medication regimen.   A: Pt provided with medications per providers orders. Pt's labs and vitals were monitored throughout the night. Pt supported emotionally and encouraged to express concerns and questions. Pt educated on medications.  R: Pt's safety ensured with 15 minute and environmental checks. Pt currently denies SI/HI/Self Harm and A/V hallucinations. Pt verbally contracts to seek staff if SI/HI or A/VH occurs and to consult with staff before acting on any harmful thoughts. Will continue to monitor.

## 2016-02-26 NOTE — Progress Notes (Signed)
Discharge Note:  Patient discharged home with family member.  Patient denied SI and HI.  Denied A/V hallucinations.  Suicide prevention information given and discussed with patient who stated he understood and had no questions. Patient stated he received all his belongings, clothing, toiletries, misc items, prescriptions, medications.  Patient stated he appreciated all assistance received from [facility] staff.  All required discharge information given to patient at discharge.

## 2016-02-26 NOTE — Tx Team (Signed)
Interdisciplinary Treatment and Diagnostic Plan Update  02/26/2016 Time of Session: 9:30AM Boden Stucky MRN: [id number]  Principal Diagnosis: Severe recurrent major depression without psychotic features [facility])  Secondary Diagnoses: Principal Problem:   Severe recurrent major depression without psychotic features ([place]) Active Problems:   Sinus tachycardia   Substance induced mood disorder ([facility])   Current Medications:  Current Facility-Administered Medications  Medication Dose Route Frequency Provider Last Rate Last Dose  . acetaminophen (TYLENOL) tablet 650 mg  650 mg Oral Q6H PRN Rozetta Nunnery, NP      . alum & mag hydroxide-simeth (MAALOX/MYLANTA) 200-200-20 MG/5ML suspension 30 mL  30 mL Oral Q4H PRN Rozetta Nunnery, NP      . amLODipine (NORVASC) tablet 5 mg  5 mg Oral Daily Rozetta Nunnery, NP   5 mg at 02/26/16 0821  . cholecalciferol (VITAMIN D) tablet 1,000 Units  1,000 Units Oral Daily Rozetta Nunnery, NP   1,000 Units at 02/26/16 [phone]  . feeding supplement (ENSURE ENLIVE) (ENSURE ENLIVE) liquid 237 mL  237 mL Oral Daily PRN Myer Peer Cobos, MD      . insulin aspart (novoLOG) injection 0-15 Units  0-15 Units Subcutaneous TID PC & HS Laverle Hobby, PA-C   3 Units at 02/26/16 [phone]  . insulin aspart protamine- aspart (NOVOLOG MIX 70/30) injection 10 Units  10 Units Subcutaneous BID WC Laverle Hobby, PA-C   10 Units at 02/26/16 8338  . magnesium hydroxide (MILK OF MAGNESIA) suspension 30 mL  30 mL Oral Daily PRN Rozetta Nunnery, NP      . metFORMIN (GLUCOPHAGE) tablet 850 mg  850 mg Oral BID WC Laverle Hobby, PA-C   850 mg at 02/26/16 2505  . sertraline (ZOLOFT) tablet 50 mg  50 mg Oral Daily Jenne Campus, MD   50 mg at 02/26/16 0821  . simvastatin (ZOCOR) tablet 20 mg  20 mg Oral Daily Rozetta Nunnery, NP   20 mg at 02/26/16 0821  . traZODone (DESYREL) tablet 50 mg  50 mg Oral QHS,MR X 1 Rozetta Nunnery, NP   50 mg at 02/25/16 2226   PTA Medications: Prescriptions Prior to  Admission  Medication Sig Dispense Refill Last Dose  . amLODipine (NORVASC) 5 MG tablet Take 1 tablet (5 mg total) by mouth daily. 30 tablet 0 Past Week at Unknown time  . Cholecalciferol (VITAMIN D PO) Take 1 tablet by mouth daily.   Past Week at Unknown time  . insulin NPH-regular Human (NOVOLIN 70/30) (70-30) 100 UNIT/ML injection Inject 7-9 Units into the skin See admin instructions. Inject 9 units subcutaneously with breakfast and 7 units with supper   02/21/2016 at Unknown time  . simvastatin (ZOCOR) 20 MG tablet Take 1 tablet by mouth daily.   Past Week at Unknown time  . [DISCONTINUED] oseltamivir (TAMIFLU) 75 MG capsule Take 1 capsule (75 mg total) by mouth 2 (two) times daily. 5 capsule 0 Past Week at Unknown time  . [DISCONTINUED] doxycycline (VIBRAMYCIN) 100 MG capsule Take 1 capsule (100 mg total) by mouth 2 (two) times daily. (Patient not taking: Reported on 02/21/2016) 10 capsule 0 Unknown at Unknown time    Patient Stressors: Financial difficulties Loss of two sons to death Substance abuse  Patient Strengths: Ability for insight Capable of independent living Motivation for treatment/growth Supportive family/friends  Treatment Modalities: Medication Management, Group therapy, Case management,  1 to 1 session with clinician, Psychoeducation, Recreational therapy.   Physician Treatment Plan for Primary  Diagnosis: Severe recurrent major depression without psychotic features ([place]) Long Term Goal(s): Improvement in symptoms so as ready for discharge Improvement in symptoms so as ready for discharge   Short Term Goals: Ability to identify changes in lifestyle to reduce recurrence of condition will improve Ability to verbalize feelings will improve Ability to disclose and discuss suicidal ideas Ability to demonstrate self-control will improve Ability to identify and develop effective coping behaviors will improve Ability to maintain clinical measurements within normal limits  will improve Compliance with prescribed medications will improve Ability to identify triggers associated with substance abuse/mental health issues will improve  Medication Management: Evaluate patient's response, side effects, and tolerance of medication regimen.  Therapeutic Interventions: 1 to 1 sessions, Unit Group sessions and Medication administration.  Evaluation of Outcomes: Met  Physician Treatment Plan for Secondary Diagnosis: Principal Problem:   Severe recurrent major depression without psychotic features ([place]) Active Problems:   Sinus tachycardia   Substance induced mood disorder ([place])  Long Term Goal(s): Improvement in symptoms so as ready for discharge Improvement in symptoms so as ready for discharge   Short Term Goals: Ability to identify changes in lifestyle to reduce recurrence of condition will improve Ability to verbalize feelings will improve Ability to disclose and discuss suicidal ideas Ability to demonstrate self-control will improve Ability to identify and develop effective coping behaviors will improve Ability to maintain clinical measurements within normal limits will improve Compliance with prescribed medications will improve Ability to identify triggers associated with substance abuse/mental health issues will improve     Medication Management: Evaluate patient's response, side effects, and tolerance of medication regimen.  Therapeutic Interventions: 1 to 1 sessions, Unit Group sessions and Medication administration.  Evaluation of Outcomes: Met   RN Treatment Plan for Primary Diagnosis: Severe recurrent major depression without psychotic features ([place]) Long Term Goal(s): Knowledge of disease and therapeutic regimen to maintain health will improve  Short Term Goals: Ability to remain free from injury will improve, Ability to verbalize feelings will improve and Ability to disclose and discuss suicidal ideas  Medication Management: RN will administer  medications as ordered by provider, will assess and evaluate patient's response and provide education to patient for prescribed medication. RN will report any adverse and/or side effects to prescribing provider.  Therapeutic Interventions: 1 on 1 counseling sessions, Psychoeducation, Medication administration, Evaluate responses to treatment, Monitor vital signs and CBGs as ordered, Perform/monitor CIWA, COWS, AIMS and Fall Risk screenings as ordered, Perform wound care treatments as ordered.  Evaluation of Outcomes: Met   LCSW Treatment Plan for Primary Diagnosis: Severe recurrent major depression without psychotic features ([place]) Long Term Goal(s): Safe transition to appropriate next level of care at discharge, Engage patient in therapeutic group addressing interpersonal concerns.  Short Term Goals: Engage patient in aftercare planning with referrals and resources, Facilitate patient progression through stages of change regarding substance use diagnoses and concerns and Identify triggers associated with mental health/substance abuse issues  Therapeutic Interventions: Assess for all discharge needs, 1 to 1 time with Social worker, Explore available resources and support systems, Assess for adequacy in community support network, Educate family and significant other(s) on suicide prevention, Complete Psychosocial Assessment, Interpersonal group therapy.  Evaluation of Outcomes: Met   Progress in Treatment: Attending groups: Yes. Participating in groups: Yes. Taking medication as prescribed: Yes. Toleration medication: Yes. Family/Significant other contact made: SPE completed with pt; pt declined to consent to family contact.  Patient understands diagnosis: Yes. Discussing patient identified problems/goals with staff: Yes. Medical problems  stabilized or resolved: Yes. Denies suicidal/homicidal ideation: No. Passive SI reported but pt is able to contract for safety on the unit.   Issues/concerns per patient self-inventory: No. Other: n/a  New problem(s) identified: No, Describe:  n/a  New Short Term/Long Term Goal(s): medication management; Detox, elimination of SI thoughts, mood stabilization, and development of comprehensive mental wellness/sobriety plan.   Discharge Plan or Barriers: Pt plans to resume services at [facility] and medication management). Pt plans to return home today and has been given AA/NA list and Mental health association pamphlet for additional community support and resources.   Reason for Continuation of Hospitalization: none  Estimated Length of Stay: d/c today  Attendees: Patient: 02/26/2016 11:12 AM  Physician: Dr. Sanjuana Letters MD 02/26/2016 11:12 AM  Nursing: Kieth Brightly RN; Rise Paganini RN 02/26/2016 11:12 AM  RN Care Manager: Rhunette Croft 02/26/2016 11:12 AM  Social Worker: Nira Conn Smart, LCSW; Adriana Reams LCSW 02/26/2016 11:12 AM  Recreational Therapist:  02/26/2016 11:12 AM  Other: Lindell Spar NP; May Augustin NP 02/26/2016 11:12 AM  Other:  02/26/2016 11:12 AM  Other: 02/26/2016 11:12 AM    Scribe for Treatment Team: [place], LCSW 02/26/2016 11:12 AM

## 2016-02-26 NOTE — Discharge Summary (Signed)
Physician Discharge Summary Note  Patient:  Jonathan Austin is an 52 y.o., male MRN:  [id number] DOB:  [birth year] Patient phone:  [phone] (home)  Patient address:   [address] [place] [address],  Total Time spent with patient: 30 minutes  Date of Admission:  02/21/2016 Date of Discharge: 02/26/2016  Reason for Admission:  Depression   Principal Problem: Severe recurrent major depression without psychotic features [facility]) Discharge Diagnoses: Patient Active Problem List   Diagnosis Date Noted  . Severe recurrent major depression without psychotic features ([place]) [F33.2] 02/21/2016  . Sepsis ([place]) secondary to CAP [A41.9] 01/20/2016  . Influenza A [J10.1] 01/20/2016  . AKI (acute kidney injury) ([place]) [N17.9] 01/20/2016  . CKD (chronic kidney disease), stage II [N18.2] 01/20/2016  . Hyperkalemia [E87.5] 01/20/2016  . CAP (community acquired pneumonia) [J18.9] 01/18/2016  . Hypotension [I95.9] 01/18/2016  . Hyperglycemia [R73.9] 09/20/2015  . HTN (hypertension) [I10] 09/20/2015  . HLD (hyperlipidemia) [E78.5] 09/20/2015  . Substance abuse [F19.10] 08/30/2014  . Substance induced mood disorder ([place]) [F19.94] 05/07/2014  . Cocaine abuse with cocaine-induced mood disorder ([place]) [F14.14]   . Alcohol abuse [F10.10]   . Fever [R50.9] 02/12/2014  . Sinus tachycardia [R00.0] 02/12/2014  . Marijuana abuse [F12.10] 06/28/2013  . Leukocytosis [D72.829] 06/28/2013  . Hyponatremia [E87.1] 06/28/2013  . Diabetes mellitus type II, uncontrolled ([place]) [E11.65] 12/09/2012    Past Psychiatric History: see HPI  Past Medical History:  Past Medical History:  Diagnosis Date  . Cellulitis 06/28/2013  . Cocaine abuse 06/28/2013  . Cocaine abuse with cocaine-induced mood disorder ([place])   . Depression   . Diabetes mellitus   . High cholesterol   . Hypertension   . Scrotal infection 06/28/2013  . Sleep apnea   . Suicidal ideations     Past Surgical History:  Procedure Laterality  Date  . TONSILLECTOMY     Family History:  Family History  Problem Relation Age of Onset  . Hypertension Mother   . Hypertension Father   . Diabetes Father    Family Psychiatric  History: see HPI Social History:  History  Alcohol Use  . 1.8 oz/week  . 1 Glasses of wine, 1 Cans of beer, 1 Shots of liquor per week    Comment: Occasionally     History  Drug Use  . Types: Cocaine, Marijuana    Social History   Social History  . Marital status: Married    Spouse name: N/A  . Number of children: N/A  . Years of education: N/A   Social History Main Topics  . Smoking status: Never Smoker  . Smokeless tobacco: Never Used  . Alcohol use 1.8 oz/week    1 Glasses of wine, 1 Cans of beer, 1 Shots of liquor per week     Comment: Occasionally  . Drug use: Yes    Types: Cocaine, Marijuana  . Sexual activity: Yes   Other Topics Concern  . None   Social History Narrative  . None    Hospital Course:  Jonathan Austin an 51 y.o.married malewho presented unaccompanied to [facility] reporting symptoms of depression including suicidal ideation. Pt has a history of depression and substance abuse and says he has not been taking psychiatric medication and has been using crack and alcohol.    Jonathan Austin was admitted for Severe recurrent major depression without psychotic features ([place]) and crisis management.  Patient was treated with medications with their indications listed below in detail under Medication List.  Medical problems  were identified and treated as needed.  Home medications were restarted as appropriate.  Improvement was monitored by observation and Jonathan Austin daily report of symptom reduction.  Emotional and mental status was monitored by daily self inventory reports completed by Jonathan Austin and clinical staff.  Patient reported continued improvement, denied any new concerns.  Patient had been compliant on medications and denied side effects.  Support and  encouragement was provided.         Jonathan Austin was evaluated by the treatment team for stability and plans for continued recovery upon discharge.  Patient was offered further treatment options upon discharge including Residential, Intensive Outpatient and Outpatient treatment. Patient will follow up with agency listed below for medication management and counseling.  Encouraged patient to maintain satisfactory support network and home environment.  Advised to adhere to medication compliance and outpatient treatment follow up.  Prescriptions provided.       Jonathan Austin motivation was an integral factor for scheduling further treatment.  Employment, transportation, bed availability, health status, family support, and any pending legal issues were also considered during patient's hospital stay.  Upon completion of this admission the patient was both mentally and medically stable for discharge denying suicidal/homicidal ideation, auditory/visual/tactile hallucinations, delusional thoughts and paranoia.      Physical Findings: AIMS: Facial and Oral Movements Muscles of Facial Expression: None, normal Lips and Perioral Area: None, normal Jaw: None, normal Tongue: None, normal,Extremity Movements Upper (arms, wrists, hands, fingers): None, normal Lower (legs, knees, ankles, toes): None, normal, Trunk Movements Neck, shoulders, hips: None, normal, Overall Severity Severity of abnormal movements (highest score from questions above): None, normal Incapacitation due to abnormal movements: None, normal Patient's awareness of abnormal movements (rate only patient's report): No Awareness, Dental Status Current problems with teeth and/or dentures?: No Does patient usually wear dentures?: No  CIWA:  CIWA-Ar Total: 1 COWS:  COWS Total Score: 2  Musculoskeletal: Strength & Muscle Tone: within normal limits Gait & Station: normal Patient leans: N/A  Psychiatric Specialty Exam:  See MD SRA Physical  Exam  Nursing note and vitals reviewed.   ROS  Blood pressure 131/87, pulse 91, temperature 97.7 F (36.5 C), temperature source Oral, resp. rate 18, height 5\' 7"  (1.702 m), weight 96.2 kg (212 lb).Body mass index is 33.2 kg/m.    Have you used any form of tobacco in the last 30 days? (Cigarettes, Smokeless Tobacco, Cigars, and/or Pipes): No  Has this patient used any form of tobacco in the last 30 days? (Cigarettes, Smokeless Tobacco, Cigars, and/or Pipes) Yes, N/A  Blood Alcohol level:  Lab Results  Component Value Date   ETH 7 (H) 02/19/2016   ETH <5 [id number]    Metabolic Disorder Labs:  Lab Results  Component Value Date   HGBA1C 12.0 (H) 02/23/2016   MPG 298 02/23/2016   MPG 329 09/21/2015   No results found for: PROLACTIN Lab Results  Component Value Date   CHOL 265 (H) 08/31/2014   TRIG 351 (H) 08/31/2014   HDL 49 08/31/2014   CHOLHDL 5.4 08/31/2014   VLDL 70 (H) 08/31/2014   LDLCALC 146 (H) 08/31/2014   LDLCALC 163 (H) 05/07/2014    See Psychiatric Specialty Exam and Suicide Risk Assessment completed by Attending Physician prior to discharge.  Discharge destination:  Home  Is patient on multiple antipsychotic therapies at discharge:  No   Has Patient had three or more failed trials of antipsychotic monotherapy by history:  No  Recommended Plan for Multiple Antipsychotic Therapies:  NA   Allergies as of 02/26/2016   No Known Allergies     Medication List    STOP taking these medications   insulin NPH-regular Human (70-30) 100 UNIT/ML injection Commonly known as:  NOVOLIN 70/30   VITAMIN D PO     TAKE these medications     Indication  amLODipine 5 MG tablet Commonly known as:  NORVASC Take 1 tablet (5 mg total) by mouth daily. Start taking on:  02/27/2016  Indication:  High Blood Pressure Disorder   insulin aspart protamine- aspart (70-30) 100 UNIT/ML injection Commonly known as:  NOVOLOG MIX 70/30 Inject 0.1 mLs (10 Units total) into the  skin 2 (two) times daily with a meal.  Indication:  Type 2 Diabetes   metFORMIN 850 MG tablet Commonly known as:  GLUCOPHAGE Take 1 tablet (850 mg total) by mouth 2 (two) times daily with a meal.  Indication:  Type 2 Diabetes   sertraline 50 MG tablet Commonly known as:  ZOLOFT Take 1 tablet (50 mg total) by mouth daily. Start taking on:  02/27/2016  Indication:  Major Depressive Disorder   simvastatin 20 MG tablet Commonly known as:  ZOCOR Take 1 tablet (20 mg total) by mouth daily. Start taking on:  02/27/2016  Indication:  High Amount of Fats in the Blood   traZODone 50 MG tablet Commonly known as:  DESYREL Take 1 tablet (50 mg total) by mouth at bedtime and may repeat dose one time if needed.  Indication:  Trouble Sleeping      Follow-up Information    [facility] Follow up on 02/29/2016.   Why:  Please resume your normal [facility] group schedule (Mon/Weds/Fri)  upon discharge. Thank you.  Contact information: [address] [place], [place] [address] Phone: [phone] Fax: [phone]          Follow-up recommendations:  Activity:  as tol Diet:  as tol  Comments:  1.  Take all your medications as prescribed.   2.  Report any adverse side effects to outpatient provider. 3.  Patient instructed to not use alcohol or illegal drugs while on prescription medicines. 4.  In the event of worsening symptoms, instructed patient to call 911, the crisis hotline or go to nearest emergency room for evaluation of symptoms.  Signed: Janett Labella, NP [facility] 02/26/2016, 11:33 AM

## 2016-02-26 NOTE — Progress Notes (Addendum)
D:  Patient's self inventory sheet, patient sleeps good, no sleep medication given.  Good appetite, normal energy level, good concentration.  Denied depression, hopeless, anxiety.  Denied SI.  Denied physical problems.  Denied pain.  Goal is stay clean and do follow ups, meetings.  Plans to attend meetings, call sponsor.  Thanks for all the help through my down time of depression.  Does have discharge plans. A:  Medications administered per MD orders.  Emotional support and encouragement given patient. R:  Denied SI and HI, contracts for safety.  Denied A/V hallucinations.  Safety maintained with 15 minute checks.

## 2016-05-09 ENCOUNTER — Observation Stay (HOSPITAL_COMMUNITY)
Admission: EM | Admit: 2016-05-09 | Discharge: 2016-05-10 | Disposition: A | Discharge status: Home / Self Care | Payer: 59 | Attending: Student in an Organized Health Care Education/Training Program | Admitting: Student in an Organized Health Care Education/Training Program

## 2016-05-09 ENCOUNTER — Emergency Department (HOSPITAL_COMMUNITY): Payer: Self-pay

## 2016-05-09 DIAGNOSIS — E785 Hyperlipidemia, unspecified: Secondary | ICD-10-CM | POA: Diagnosis present

## 2016-05-09 DIAGNOSIS — E860 Dehydration: Secondary | ICD-10-CM | POA: Insufficient documentation

## 2016-05-09 DIAGNOSIS — E1122 Type 2 diabetes mellitus with diabetic chronic kidney disease: Secondary | ICD-10-CM | POA: Diagnosis not present

## 2016-05-09 DIAGNOSIS — N179 Acute kidney failure, unspecified: Secondary | ICD-10-CM

## 2016-05-09 DIAGNOSIS — F1210 Cannabis abuse, uncomplicated: Secondary | ICD-10-CM | POA: Diagnosis present

## 2016-05-09 DIAGNOSIS — F329 Major depressive disorder, single episode, unspecified: Secondary | ICD-10-CM | POA: Diagnosis not present

## 2016-05-09 DIAGNOSIS — E1142 Type 2 diabetes mellitus with diabetic polyneuropathy: Secondary | ICD-10-CM | POA: Diagnosis present

## 2016-05-09 DIAGNOSIS — R000 Tachycardia, unspecified: Secondary | ICD-10-CM | POA: Diagnosis present

## 2016-05-09 DIAGNOSIS — N189 Chronic kidney disease, unspecified: Secondary | ICD-10-CM

## 2016-05-09 DIAGNOSIS — E1165 Type 2 diabetes mellitus with hyperglycemia: Secondary | ICD-10-CM | POA: Diagnosis present

## 2016-05-09 DIAGNOSIS — N182 Chronic kidney disease, stage 2 (mild): Secondary | ICD-10-CM | POA: Diagnosis present

## 2016-05-09 DIAGNOSIS — F1410 Cocaine abuse, uncomplicated: Secondary | ICD-10-CM | POA: Diagnosis not present

## 2016-05-09 DIAGNOSIS — F1414 Cocaine abuse with cocaine-induced mood disorder: Secondary | ICD-10-CM | POA: Diagnosis present

## 2016-05-09 DIAGNOSIS — I129 Hypertensive chronic kidney disease with stage 1 through stage 4 chronic kidney disease, or unspecified chronic kidney disease: Secondary | ICD-10-CM | POA: Insufficient documentation

## 2016-05-09 DIAGNOSIS — F1010 Alcohol abuse, uncomplicated: Secondary | ICD-10-CM | POA: Diagnosis present

## 2016-05-09 DIAGNOSIS — R509 Fever, unspecified: Secondary | ICD-10-CM

## 2016-05-09 DIAGNOSIS — Z79899 Other long term (current) drug therapy: Secondary | ICD-10-CM | POA: Insufficient documentation

## 2016-05-09 DIAGNOSIS — J101 Influenza due to other identified influenza virus with other respiratory manifestations: Principal | ICD-10-CM | POA: Diagnosis present

## 2016-05-09 DIAGNOSIS — N289 Disorder of kidney and ureter, unspecified: Secondary | ICD-10-CM | POA: Diagnosis present

## 2016-05-09 DIAGNOSIS — E861 Hypovolemia: Secondary | ICD-10-CM

## 2016-05-09 DIAGNOSIS — E872 Acidosis, unspecified: Secondary | ICD-10-CM

## 2016-05-09 DIAGNOSIS — I10 Essential (primary) hypertension: Secondary | ICD-10-CM | POA: Diagnosis present

## 2016-05-09 DIAGNOSIS — I700 Atherosclerosis of aorta: Secondary | ICD-10-CM | POA: Diagnosis present

## 2016-05-09 DIAGNOSIS — F332 Major depressive disorder, recurrent severe without psychotic features: Secondary | ICD-10-CM | POA: Diagnosis present

## 2016-05-09 DIAGNOSIS — Z794 Long term (current) use of insulin: Secondary | ICD-10-CM | POA: Insufficient documentation

## 2016-05-09 DIAGNOSIS — IMO0002 Reserved for concepts with insufficient information to code with codable children: Secondary | ICD-10-CM | POA: Diagnosis present

## 2016-05-09 DIAGNOSIS — J1089 Influenza due to other identified influenza virus with other manifestations: Secondary | ICD-10-CM

## 2016-05-09 DIAGNOSIS — A419 Sepsis, unspecified organism: Secondary | ICD-10-CM | POA: Diagnosis present

## 2016-05-09 LAB — CBC WITH DIFFERENTIAL/PLATELET
BAND NEUTROPHILS: 0 %
BLASTS: 0 %
Basophils Absolute: 0 10*3/uL (ref 0.0–0.1)
Basophils Relative: 0 %
Eosinophils Absolute: 0.1 10*3/uL (ref 0.0–0.7)
Eosinophils Relative: 2 %
HEMATOCRIT: 47.2 % (ref 39.0–52.0)
HEMOGLOBIN: 16.1 g/dL (ref 13.0–17.0)
LYMPHS PCT: 25 %
Lymphs Abs: 1.6 10*3/uL (ref 0.7–4.0)
MCH: 30.1 pg (ref 26.0–34.0)
MCHC: 34.1 g/dL (ref 30.0–36.0)
MCV: 88.4 fL (ref 78.0–100.0)
MONOS PCT: 7 %
Metamyelocytes Relative: 0 %
Monocytes Absolute: 0.4 10*3/uL (ref 0.1–1.0)
Myelocytes: 0 %
NEUTROS ABS: 4.2 10*3/uL (ref 1.7–7.7)
NEUTROS PCT: 66 %
NRBC: 0 /100{WBCs}
OTHER: 0 %
PROMYELOCYTES ABS: 0 %
Platelets: 282 10*3/uL (ref 150–400)
RBC: 5.34 MIL/uL (ref 4.22–5.81)
RDW: 13.6 % (ref 11.5–15.5)
WBC: 6.3 10*3/uL (ref 4.0–10.5)

## 2016-05-09 LAB — COMPREHENSIVE METABOLIC PANEL
ALBUMIN: 3.4 g/dL — AB (ref 3.5–5.0)
ALT: 18 U/L (ref 17–63)
AST: 23 U/L (ref 15–41)
Alkaline Phosphatase: 51 U/L (ref 38–126)
Anion gap: 11 (ref 5–15)
BUN: 8 mg/dL (ref 6–20)
CHLORIDE: 91 mmol/L — AB (ref 101–111)
CO2: 30 mmol/L (ref 22–32)
CREATININE: 1.4 mg/dL — AB (ref 0.61–1.24)
Calcium: 9.3 mg/dL (ref 8.9–10.3)
GFR calc non Af Amer: 57 mL/min — ABNORMAL LOW (ref >60)
Glucose, Bld: 468 mg/dL — ABNORMAL HIGH (ref 65–99)
POTASSIUM: 5.1 mmol/L (ref 3.5–5.1)
Sodium: 132 mmol/L — ABNORMAL LOW (ref 135–145)
Total Bilirubin: 0.6 mg/dL (ref 0.3–1.2)
Total Protein: 7.1 g/dL (ref 6.5–8.1)

## 2016-05-09 LAB — INFLUENZA PANEL BY PCR (TYPE A & B)
Influenza A By PCR: NEGATIVE
Influenza B By PCR: POSITIVE — AB

## 2016-05-09 LAB — PROTIME-INR
INR: 0.98
Prothrombin Time: 13 seconds (ref 11.4–15.2)

## 2016-05-09 LAB — URINALYSIS, ROUTINE W REFLEX MICROSCOPIC
BILIRUBIN URINE: NEGATIVE
Bacteria, UA: NONE SEEN
HGB URINE DIPSTICK: NEGATIVE
Ketones, ur: 20 mg/dL — AB
LEUKOCYTES UA: NEGATIVE
Nitrite: NEGATIVE
Protein, ur: NEGATIVE mg/dL
Specific Gravity, Urine: 1.039 — ABNORMAL HIGH (ref 1.005–1.030)
Squamous Epithelial / LPF: NONE SEEN
pH: 6 (ref 5.0–8.0)

## 2016-05-09 LAB — I-STAT CG4 LACTIC ACID, ED
LACTIC ACID, VENOUS: 3 mmol/L — AB (ref 0.5–1.9)
LACTIC ACID, VENOUS: 1.69 mmol/L (ref 0.5–1.9)

## 2016-05-09 LAB — RAPID URINE DRUG SCREEN, HOSP PERFORMED
AMPHETAMINES: NOT DETECTED
BARBITURATES: NOT DETECTED
BENZODIAZEPINES: NOT DETECTED
Cocaine: POSITIVE — AB
Opiates: NOT DETECTED
Tetrahydrocannabinol: NOT DETECTED

## 2016-05-09 LAB — CBG MONITORING, ED
Glucose-Capillary: 308 mg/dL — ABNORMAL HIGH (ref 65–99)
Glucose-Capillary: 320 mg/dL — ABNORMAL HIGH (ref 65–99)

## 2016-05-09 LAB — GLUCOSE, CAPILLARY: Glucose-Capillary: 303 mg/dL — ABNORMAL HIGH (ref 65–99)

## 2016-05-09 MED ORDER — ADULT MULTIVITAMIN W/MINERALS CH
1.0000 | ORAL_TABLET | Freq: Every day | ORAL | Status: DC
  Administered 2016-05-10: 1 via ORAL
  Filled 2016-05-09: qty 1

## 2016-05-09 MED ORDER — LORAZEPAM 2 MG/ML IJ SOLN: 1.0000 mg | Freq: Four times a day (QID) | INTRAMUSCULAR | Status: DC | PRN

## 2016-05-09 MED ORDER — VITAMIN B-1 100 MG PO TABS
100.0000 mg | ORAL_TABLET | Freq: Every day | ORAL | Status: DC
  Administered 2016-05-09 – 2016-05-10 (×2): 100 mg via ORAL
  Filled 2016-05-09 (×2): qty 1

## 2016-05-09 MED ORDER — FLUTICASONE PROPIONATE 50 MCG/ACT NA SUSP
2.0000 | Freq: Every day | NASAL | Status: DC
  Administered 2016-05-09 – 2016-05-10 (×2): 2 via NASAL
  Filled 2016-05-09: qty 16

## 2016-05-09 MED ORDER — INSULIN ASPART 100 UNIT/ML ~~LOC~~ SOLN: 0.0000 [IU] | Freq: Every day | SUBCUTANEOUS | Status: DC

## 2016-05-09 MED ORDER — ONDANSETRON HCL 4 MG/2ML IJ SOLN: 4.0000 mg | Freq: Four times a day (QID) | INTRAMUSCULAR | Status: DC | PRN

## 2016-05-09 MED ORDER — INSULIN ASPART 100 UNIT/ML ~~LOC~~ SOLN
0.0000 [IU] | SUBCUTANEOUS | Status: DC
  Administered 2016-05-09: 11 [IU] via SUBCUTANEOUS
  Filled 2016-05-09: qty 1

## 2016-05-09 MED ORDER — GUAIFENESIN ER 600 MG PO TB12
600.0000 mg | ORAL_TABLET | Freq: Two times a day (BID) | ORAL | Status: DC
  Administered 2016-05-09 – 2016-05-10 (×2): 600 mg via ORAL
  Filled 2016-05-09 (×2): qty 1

## 2016-05-09 MED ORDER — INSULIN ASPART PROT & ASPART (70-30 MIX) 100 UNIT/ML ~~LOC~~ SUSP
5.0000 [IU] | Freq: Two times a day (BID) | SUBCUTANEOUS | Status: DC
  Administered 2016-05-10: 5 [IU] via SUBCUTANEOUS
  Filled 2016-05-09: qty 10

## 2016-05-09 MED ORDER — SODIUM CHLORIDE 0.9 % IV BOLUS (SEPSIS)
1000.0000 mL | Freq: Once | INTRAVENOUS | Status: AC
  Administered 2016-05-09: 1000 mL via INTRAVENOUS

## 2016-05-09 MED ORDER — ONDANSETRON HCL 4 MG/2ML IJ SOLN
4.0000 mg | Freq: Once | INTRAMUSCULAR | Status: AC
Start: 2016-05-09 — End: 2016-05-09
  Administered 2016-05-09: 4 mg via INTRAVENOUS
  Filled 2016-05-09: qty 2

## 2016-05-09 MED ORDER — ENOXAPARIN SODIUM 40 MG/0.4ML ~~LOC~~ SOLN
40.0000 mg | SUBCUTANEOUS | Status: DC
  Administered 2016-05-09: 40 mg via SUBCUTANEOUS
  Filled 2016-05-09: qty 0.4

## 2016-05-09 MED ORDER — THIAMINE HCL 100 MG/ML IJ SOLN: 100.0000 mg | Freq: Every day | INTRAMUSCULAR | Status: DC

## 2016-05-09 MED ORDER — INSULIN ASPART 100 UNIT/ML ~~LOC~~ SOLN
0.0000 [IU] | Freq: Three times a day (TID) | SUBCUTANEOUS | Status: DC
  Administered 2016-05-10: 3 [IU] via SUBCUTANEOUS

## 2016-05-09 MED ORDER — PIPERACILLIN-TAZOBACTAM 3.375 G IVPB 30 MIN: 3.3750 g | Freq: Once | INTRAVENOUS | Status: DC

## 2016-05-09 MED ORDER — VANCOMYCIN HCL 10 G IV SOLR
1500.0000 mg | Freq: Once | INTRAVENOUS | Status: DC
  Filled 2016-05-09: qty 1500

## 2016-05-09 MED ORDER — SODIUM CHLORIDE 0.9 % IV SOLN: 1250.0000 mg | Freq: Two times a day (BID) | INTRAVENOUS | Status: DC

## 2016-05-09 MED ORDER — FOLIC ACID 1 MG PO TABS
1.0000 mg | ORAL_TABLET | Freq: Every day | ORAL | Status: DC
  Administered 2016-05-10: 1 mg via ORAL
  Filled 2016-05-09: qty 1

## 2016-05-09 MED ORDER — SODIUM CHLORIDE 0.9 % IV SOLN
INTRAVENOUS | Status: AC
  Administered 2016-05-09: 21:00:00 via INTRAVENOUS

## 2016-05-09 MED ORDER — KETOROLAC TROMETHAMINE 30 MG/ML IJ SOLN
30.0000 mg | Freq: Once | INTRAMUSCULAR | Status: AC
  Administered 2016-05-09: 30 mg via INTRAVENOUS
  Filled 2016-05-09: qty 1

## 2016-05-09 MED ORDER — ONDANSETRON HCL 4 MG PO TABS: 4.0000 mg | ORAL_TABLET | Freq: Four times a day (QID) | ORAL | Status: DC | PRN

## 2016-05-09 MED ORDER — PIPERACILLIN-TAZOBACTAM 3.375 G IVPB: 3.3750 g | Freq: Three times a day (TID) | INTRAVENOUS | Status: DC

## 2016-05-09 MED ORDER — LORAZEPAM 1 MG PO TABS: 1.0000 mg | ORAL_TABLET | Freq: Four times a day (QID) | ORAL | Status: DC | PRN

## 2016-05-09 MED ORDER — ACETAMINOPHEN 325 MG PO TABS
650.0000 mg | ORAL_TABLET | Freq: Four times a day (QID) | ORAL | Status: DC | PRN
Start: 2016-05-09 — End: 2016-05-10

## 2016-05-09 NOTE — ED Notes (Signed)
Pt's CBG result was 308. Informed Erin - RN.

## 2016-05-09 NOTE — ED Notes (Signed)
Notified pharmacy for verification of medications.

## 2016-05-09 NOTE — ED Notes (Signed)
Dr Lita Mains notified of lactic of 3.0

## 2016-05-09 NOTE — ED Provider Notes (Signed)
Jonathan DEPT Provider Note   CSN: [id number] Arrival date & time: 05/09/16  1201     History   Chief Complaint Chief Complaint  Patient presents Jonathan  . Generalized Body Aches    HPI Jonathan Austin is a 51 y.o. Austin.  HPI   Level V caveat due to decreased level of consciousness.  Jonathan Austin is a 51 y.o. Austin, Jonathan Austin, HTN, cocaine, presenting to the ED Jonathan body aches, subjective/tactile fever, fatigue, malaise, productive cough Jonathan clear sputum, and generalized body aches for the last 2-3 days. One episode of post tussive vomiting. Endorses poor fluid intake. Patient's wife at the bedside states patient has been sleepy and "less responsive." Patient is unable to provide much of the history and most information is provided by the patient's wife.     Past Medical History:  Diagnosis Date  . Cellulitis 06/28/2013  . Cocaine abuse 06/28/2013  . Cocaine abuse Jonathan cocaine-induced mood disorder ([place])   . Depression   . Diabetes mellitus   . High cholesterol   . Hypertension   . Scrotal infection 06/28/2013  . Sleep apnea   . Suicidal ideations     Patient Active Problem List   Diagnosis Date Noted  . Sepsis ([place]) 05/09/2016  . Severe recurrent major depression without psychotic features ([place]) 02/21/2016  . CKD (chronic kidney disease), stage II 01/20/2016  . HTN (hypertension) 09/20/2015  . HLD (hyperlipidemia) 09/20/2015  . Substance abuse 08/30/2014  . Substance induced mood disorder ([place]) 05/07/2014  . Cocaine abuse Jonathan cocaine-induced mood disorder ([place])   . Alcohol abuse   . Sinus tachycardia 02/12/2014  . Marijuana abuse 06/28/2013  . Diabetes mellitus type II, uncontrolled ([place]) 12/09/2012    Past Surgical History:  Procedure Laterality Date  . TONSILLECTOMY         Home Medications    Prior to Admission medications   Medication Sig Start Date End Date Taking? Authorizing Provider  acetaminophen (TYLENOL) 325 MG tablet Take  650 mg by mouth every 6 (six) hours as needed for mild pain.   Yes Historical Provider, MD  amLODipine (NORVASC) 5 MG tablet Take 1 tablet (5 mg total) by mouth daily. 02/27/16  Yes Kerrie [place], NP  cholecalciferol (VITAMIN D) 1000 units tablet Take 1,000 Units by mouth daily.   Yes Historical Provider, MD  Hypromellose (ARTIFICIAL TEARS OP) Place 1 drop into both eyes daily as needed (dry eyes).   Yes Historical Provider, MD  insulin aspart protamine- aspart (NOVOLOG MIX 70/30) (70-30) 100 UNIT/ML injection Inject 0.1 mLs (10 Units total) into the skin 2 (two) times daily Jonathan a meal. Patient taking differently: Inject 7-8 Units into the skin See admin instructions. 7 units in am, 8 units in pm 02/26/16  Yes Kerrie [place], NP  Pseudoeph-Doxylamine-Austin-APAP (NYQUIL MULTI-SYMPTOM PO) Take 1 capsule by mouth at bedtime as needed (sleep).   Yes Historical Provider, MD  simvastatin (ZOCOR) 20 MG tablet Take 1 tablet (20 mg total) by mouth daily. 02/27/16  Yes Kerrie [place], NP  metFORMIN (GLUCOPHAGE) 850 MG tablet Take 1 tablet (850 mg total) by mouth 2 (two) times daily Jonathan a meal. Patient not taking: Reported on 05/09/2016 02/26/16   Kerrie [place], NP  sertraline (ZOLOFT) 50 MG tablet Take 1 tablet (50 mg total) by mouth daily. Patient not taking: Reported on 05/09/2016 02/27/16   Kerrie [place], NP  traZODone (DESYREL) 50 MG tablet Take 1 tablet (50 mg total) by mouth at bedtime and may  repeat dose one time if needed. Patient not taking: Reported on 05/09/2016 02/26/16   Kerrie [place], NP    Family History Family History  Problem Relation Age of Onset  . Hypertension Mother   . Hypertension Father   . Diabetes Father     Social History Social History  Substance Use Topics  . Smoking status: Never Smoker  . Smokeless tobacco: Never Used  . Alcohol use 1.8 oz/week    1 Glasses of wine, 1 Cans of beer, 1 Shots of liquor per week     Comment: Occasionally     Allergies   Patient has no  known allergies.   Review of Systems Review of Systems  Unable to perform ROS: Mental status change  Constitutional: Positive for activity change, chills, fatigue and fever.  Respiratory: Positive for cough.   Gastrointestinal: Positive for vomiting (posttussive).     Physical Exam Updated Vital Signs BP (!) 124/95 (BP Location: Right Arm)   Pulse (!) 111   Temp 98 F (36.7 C) (Oral)   Resp 20   SpO2 97%   Physical Exam  Constitutional: He appears well-developed and well-nourished. He appears lethargic. No distress.  HENT:  Head: Normocephalic and atraumatic.  Mouth/Throat: Mucous membranes are dry.  Eyes: Conjunctivae are normal.  Neck: Neck supple.  Cardiovascular: Regular rhythm, normal heart sounds and intact distal pulses.  Tachycardia present.   Pulmonary/Chest: Effort normal and breath sounds normal. No respiratory distress.  Abdominal: Soft. There is no tenderness. There is no guarding.  Musculoskeletal: He exhibits no edema.  Lymphadenopathy:    He has no cervical adenopathy.  Neurological: He appears lethargic.  Somnolent. Patient has difficulty keeping his eyes open and paying attention to the interview.  Skin: Skin is warm and dry. He is not diaphoretic.  Psychiatric: He has a normal mood and affect. His behavior is normal.  Nursing note and vitals reviewed.    ED Treatments / Results  Labs (all labs ordered are listed, but only abnormal results are displayed) Labs Reviewed  COMPREHENSIVE METABOLIC PANEL - Abnormal; Notable for the following:       Result Value   Sodium 132 (*)    Chloride 91 (*)    Glucose, Bld 468 (*)    Creatinine, Ser 1.40 (*)    Albumin 3.4 (*)    GFR calc non Af Amer 57 (*)    All other components within normal limits  I-STAT CG4 LACTIC ACID, ED - Abnormal; Notable for the following:    Lactic Acid, Venous 3.00 (*)    All other components within normal limits  CULTURE, BLOOD (ROUTINE X 2)  CULTURE, BLOOD (ROUTINE X 2)  CBC  Jonathan DIFFERENTIAL/PLATELET  PROTIME-INR  URINALYSIS, ROUTINE W REFLEX MICROSCOPIC  RAPID URINE DRUG SCREEN, HOSP PERFORMED  INFLUENZA PANEL BY PCR (TYPE A & B)   BUN  Date Value Ref Range Status  05/09/2016 8 6 - 20 mg/dL Final  02/23/2016 14 6 - 20 mg/dL Final  02/19/2016 12 6 - 20 mg/dL Final  01/19/2016 11 6 - 20 mg/dL Final   Creatinine, Ser  Date Value Ref Range Status  05/09/2016 1.40 (H) 0.61 - 1.24 mg/dL Final  02/23/2016 1.06 0.61 - 1.24 mg/dL Final  02/19/2016 1.36 (H) 0.61 - 1.24 mg/dL Final  01/19/2016 1.32 (H) 0.61 - 1.24 mg/dL Final     EKG  EKG Interpretation None       Radiology Dg Chest 2 View  Result Date: 05/09/2016 CLINICAL DATA:  Cough, chest congestion, chills, chest pain and nausea for the past week. EXAM: CHEST  2 VIEW COMPARISON:  PA and lateral chest x-ray of January 18, 2016. FINDINGS: The lungs are adequately inflated. There is no focal infiltrate. There is no pleural effusion. The heart and pulmonary vascularity are normal. The mediastinum is normal in width. There is calcification in the wall of the aortic arch. The bony thorax is unremarkable. IMPRESSION: There is no pneumonia nor other acute cardiopulmonary abnormality. Thoracic aortic atherosclerosis. Electronically Signed   By: David  Martinique M.D.   On: 05/09/2016 12:44    Procedures Procedures (including critical care time)  Medications Ordered in ED Medications  sodium chloride 0.9 % bolus 1,000 mL (not administered)  sodium chloride 0.9 % bolus 1,000 mL (1,000 mLs Intravenous New Bag/Given 05/09/16 1432)    Followed by  sodium chloride 0.9 % bolus 1,000 mL (1,000 mLs Intravenous New Bag/Given 05/09/16 1432)  ondansetron (ZOFRAN) injection 4 mg (4 mg Intravenous Given 05/09/16 1432)  ketorolac (TORADOL) 30 MG/ML injection 30 mg (30 mg Intravenous Given 05/09/16 1432)     Initial Impression / Assessment and Plan / ED Course  I have reviewed the triage vital signs and the nursing  notes.  Pertinent labs & imaging results that were available during my care of the patient were reviewed by me and considered in my medical decision making (see chart for details).     Patient presents Jonathan a febrile illness. Lactic acidosis. Increased creatinine. Hyperglycemic. Suspect significant dehydration. Occult pneumonia is a possibility. Dr. Lita Mains spoke Jonathan family medicine resident, who agreed to come assess the patient and admit.   Findings and plan of care discussed Jonathan Julianne Rice, MD. Dr. Lita Mains personally evaluated and examined this patient.  Vitals:   05/09/16 1219 05/09/16 1318 05/09/16 1400 05/09/16 1530  BP: (!) 144/99 (!) 124/95 (!) 137/94 136/85  Pulse: (!) 114 (!) 111 (!) 108 (!) 101  Resp: 19 20 (!) 23 18  Temp: 99 F (37.2 C) 98 F (36.7 C)    TempSrc: Oral Oral    SpO2: 97% 97% 94% 91%     Final Clinical Impressions(s) / ED Diagnoses   Final diagnoses:  Febrile illness  Lactic acidosis  Dehydration    New Prescriptions New Prescriptions   No medications on file     Layla Maw 05/09/16 1545    Julianne Rice, MD 05/11/16 1949

## 2016-05-09 NOTE — ED Notes (Signed)
Pt. States he will try to give urine specimen. Urinal at bedside.

## 2016-05-09 NOTE — ED Triage Notes (Signed)
To ED for eval of generalized body aches, chills, coughing since last week. Getting worse. No meds otc taken. Sounds congestion.

## 2016-05-09 NOTE — ED Notes (Signed)
Pt. Ambulatory to restroom with steady gait.  

## 2016-05-09 NOTE — ED Notes (Signed)
Admitting at bedside 

## 2016-05-09 NOTE — Progress Notes (Signed)
RT applied CPAP of 14 cmH2O at patient's request via [facility].  Patient tolerated the pressure fine and stated that he would be able to apply and take off. RT advised patient if he needed any further assistance to advise RN to call respiratory.      05/09/16 2222  BiPAP/CPAP/SIPAP  BiPAP/CPAP/SIPAP Pt Type Adult  Mask Type Full face mask  Mask Size Large  Respiratory Rate 18 breaths/min  EPAP 14 cmH2O  Oxygen Percent 21 %  BiPAP/CPAP/SIPAP CPAP  Patient Home Equipment No  Auto Titrate No  BiPAP/CPAP /SiPAP Vitals  Bilateral Breath Sounds Clear;Diminished

## 2016-05-09 NOTE — H&P (Signed)
Date: 05/09/2016               Patient Name:  Jonathan Austin MRN: [id number]  DOB: 1965/06/25 Age / Sex: 51 y.o., male   PCP: No Pcp Per Patient         Medical Service: Internal Medicine Teaching Service         Attending Physician: Dr. Axel Filler, MD    First Contact: Dr. Inda Castle Pager: [phone]  Second Contact: Dr. Tiburcio Pea Pager: [phone]       After Hours (After 5p/  First Contact Pager: [phone]  weekends / holidays): Second Contact Pager: [phone]   Chief Complaint: "I've been feeling really sick for the last week."  History of Present Illness: Jonathan Austin is a 51 y.o. male with history of DM, HTN, CKDII, OSA, polysubstance abuse, and severe recurrent depression, who presents with 1 week of cough, fevers, and generalized malaise.  For the past week he has had fevers (up to 101*F), chills, body aches, cough productive of scant greenish sputum, intermittent headache, cramping in his feet, and nausea with vomiting (clear, non-bloody). He states that the symptoms have been waxing and waning over the last one week. Patient has tried acetaminophen and NyQuil without much relief.  He has been trying to maintain adequate fluid intake- he states a gallon of water per day. Patient has been urinating as normal, no dysuria or decreased urination. He thinks he is dehydrated due to dry mouth.    He denies dyspnea, diarrhea, constipation, rashes, numbness, or weakness.   Possible sick contacts with people with respiratory illnesses at church.  He was recently admitted to [facility] 1/14 - 02/26/2016 for severe depression with suicidal ideation.  He was discharged home on sertraline.  In 01/2016 he had a brief hospitalization for sepsis secondary to influenza pneumonia.  Meds:  Current Meds  Medication Sig  . acetaminophen (TYLENOL) 325 MG tablet Take 650 mg by mouth every 6 (six) hours as needed for mild pain.  Marland Kitchen amLODipine (NORVASC) 5 MG tablet Take 1 tablet (5 mg total) by mouth  daily.  . cholecalciferol (VITAMIN D) 1000 units tablet Take 1,000 Units by mouth daily.  . Hypromellose (ARTIFICIAL TEARS OP) Place 1 drop into both eyes daily as needed (dry eyes).  . insulin aspart protamine- aspart (NOVOLOG MIX 70/30) (70-30) 100 UNIT/ML injection Inject 0.1 mLs (10 Units total) into the skin 2 (two) times daily with a meal. (Patient taking differently: Inject 7-8 Units into the skin See admin instructions. 7 units in am, 8 units in pm)  . Pseudoeph-Doxylamine-DM-APAP (NYQUIL MULTI-SYMPTOM PO) Take 1 capsule by mouth at bedtime as needed (sleep).  . simvastatin (ZOCOR) 20 MG tablet Take 1 tablet (20 mg total) by mouth daily.     Allergies: Allergies as of 05/09/2016  . (No Known Allergies)   Past Medical History:  Diagnosis Date  . Cellulitis 06/28/2013  . Cocaine abuse 06/28/2013  . Cocaine abuse with cocaine-induced mood disorder ([place])   . Depression   . Diabetes mellitus   . High cholesterol   . Hypertension   . Scrotal infection 06/28/2013  . Sleep apnea   . Suicidal ideations     Family History:  Father: T2DM, HTN  Social History:  Never smoker 24 beers per week, no history of withdrawals Current cocaine abuse, last about 1 week ago Lives in a house in [place] with his wife  Review of Systems: A complete ROS was negative except as per HPI.  Physical Exam: Blood pressure 136/85, pulse (!) 101, temperature 98 F (36.7 C), temperature source Oral, resp. rate 18, weight 211 lb 10.3 oz (96 kg), SpO2 91 %.  Physical Exam  Constitutional: He is oriented to person, place, and time. He appears well-developed and well-nourished. No distress.  HENT:  Mouth/Throat: Oropharynx is clear and moist.  Nasal turbinates mildly erythematous and edematous  Eyes: Conjunctivae are normal. No scleral icterus.  Neck: Normal range of motion. Neck supple.  Cardiovascular: Normal rate and regular rhythm.   Pulmonary/Chest: Effort normal and breath sounds normal. No  respiratory distress. He has no rales.  Musculoskeletal: He exhibits no edema or tenderness.  Lymphadenopathy:    He has no cervical adenopathy.  Neurological: He is alert and oriented to person, place, and time.  Skin: Skin is warm.  Mildly diaphoretic  Psychiatric: He has a normal mood and affect. His behavior is normal.    CBC Latest Ref Rng & Units 05/09/2016 02/19/2016 01/18/2016  WBC 4.0 - 10.5 K/uL 6.3 7.3 7.0  Hemoglobin 13.0 - 17.0 g/dL 16.1 15.8 16.2  Hematocrit 39.0 - 52.0 % 47.2 45.1 47.2  Platelets 150 - 400 K/uL 282 333 229   CMP Latest Ref Rng & Units 05/09/2016 02/23/2016 02/19/2016  Glucose 65 - 99 mg/dL 468(H) 176(H) 502(HH)  BUN 6 - 20 mg/dL 8 14 12   Creatinine 0.61 - 1.24 mg/dL 1.40(H) 1.06 1.36(H)  Sodium 135 - 145 mmol/L 132(L) 138 130(L)  Potassium 3.5 - 5.1 mmol/L 5.1 3.8 4.3  Chloride 101 - 111 mmol/L 91(L) 103 94(L)  CO2 22 - 32 mmol/L 30 27 23   Calcium 8.9 - 10.3 mg/dL 9.3 8.9 9.6  Total Protein 6.5 - 8.1 g/dL 7.1 - 7.8  Total Bilirubin 0.3 - 1.2 mg/dL 0.6 - 0.4  Alkaline Phos 38 - 126 U/L 51 - 68  AST 15 - 41 U/L 23 - 25  ALT 17 - 63 U/L 18 - 32   Lactic Acid, Venous    Component Value Date/Time   LATICACIDVEN 3.00 (HH) 05/09/2016 1256   Chest Radiographs PA and Lateral 05/09/2016 No consolidation, infiltrate, edema, or PTX.  Assessment & Plan by Problem: Principal Problem:   Viral URI with cough Active Problems:   Diabetes mellitus type II, uncontrolled (HCC)   Marijuana abuse   Sinus tachycardia   Cocaine abuse with cocaine-induced mood disorder (HCC)   Alcohol abuse   HTN (hypertension)   HLD (hyperlipidemia)   Severe recurrent major depression without psychotic features ([place])   Thoracic aortic atherosclerosis ([place])   51 y.o. male with 1 week of cough, fevers, and generalized malaise from a viral URI complicated by AKI and lactic acidosis due to hypovolemia from vomiting and reduced oral intake.  Afebrile and normotensive here without  leukocytosis, dyspnea, or consolidation/infiltrate on CXR exclude pneumonia.  No wheezing, hypoxia, or dyspnea to suggest RAD.  #Viral URI Supportive care with IVF, antiemetics, Tylenol, antitussives, mucolytics.  #Acute on Chronic Renal Insufficiency #Lactic Acidosis Baseline Cr ~1.0, up to 1.4 on admission.  Likely prerenal due to vomiting and reduced oral hydration. -Trend BMP and lactic acid -mIVF NS 125 mL/hr -f/u flu PCR -f/u BCx  #DM #Asymptomatic Hyperglycemia Taking NovoLog 70/30 7U qAM 8U qPM, none today.  Hyperglycemic to 400s, no acidosis, no altered mental status. -NovoLog 70/30 5U BID -SSI  #HTN Mildly hypertensive, not taking any antihypertensives at home. -Monitor BPs -Initiate/resume amlodipine if needed -Outpatient follow-up  #Depression Reports mood is good, not taking any psych  meds.  Does not have a PCP or psychiatrist.  Sees counselors and physicians through [facility] [facility]. -[facility] follow-up  #Alcohol Abuse Drinking about 24 beers per week recently.  No history of withdrawals. -CIWA -Ativan PRN -Thiamine, folate, MV  #Cocaine Abuse Last use 1 week ago. -Follow-up with [facility]  Fluids: NS 125 mL/hr Diet: carb DVT Prophylaxis: lovenox Code Status: full  Dispo: Admit patient to Observation with expected length of stay less than 2 midnights.  Signed: Florinda Marker, MD 05/09/2016, 4:14 PM  Pager: [phone]

## 2016-05-09 NOTE — Progress Notes (Signed)
Pharmacy Antibiotic Note  Jonathan Austin is a 51 y.o. male admitted on 05/09/2016 with sepsis  Pharmacy has been consulted for vancomycin and zosyn dosing. Renal insufficiency noted with sCr 1.4 and CrCl 46ml/min.   Vancomycin trough goal 15-20  Plan: 1) Vancomycin 1500mg  IV x1 then 1250mg  IV q12 2) Zosyn 3.375g IV q8 (4 hour infusion) 3) Follow renal function, cultures, LOT, level as needed  Weight: 211 lb 10.3 oz (96 kg)  Temp (24hrs), Avg:98.5 F (36.9 C), Min:98 F (36.7 C), Max:99 F (37.2 C)   Recent Labs Lab 05/09/16 1235 05/09/16 1256  WBC 6.3  --   CREATININE 1.40*  --   LATICACIDVEN  --  3.00*    Estimated Creatinine Clearance: 69.7 mL/min (A) (by C-G formula based on SCr of 1.4 mg/dL (H)).    No Known Allergies  Antimicrobials this admission: 4/2 Vancomycin >> 4/2 Zosyn >>  Dose adjustments this admission: n/a  Microbiology results: None yet  Thank you for allowing pharmacy to be a part of this patient's care.  Deboraha Sprang 05/09/2016 3:55 PM

## 2016-05-10 DIAGNOSIS — F1010 Alcohol abuse, uncomplicated: Secondary | ICD-10-CM | POA: Diagnosis not present

## 2016-05-10 DIAGNOSIS — R000 Tachycardia, unspecified: Secondary | ICD-10-CM | POA: Diagnosis not present

## 2016-05-10 DIAGNOSIS — F1419 Cocaine abuse with unspecified cocaine-induced disorder: Secondary | ICD-10-CM | POA: Diagnosis not present

## 2016-05-10 DIAGNOSIS — N179 Acute kidney failure, unspecified: Secondary | ICD-10-CM | POA: Diagnosis not present

## 2016-05-10 DIAGNOSIS — F1210 Cannabis abuse, uncomplicated: Secondary | ICD-10-CM | POA: Diagnosis not present

## 2016-05-10 DIAGNOSIS — E118 Type 2 diabetes mellitus with unspecified complications: Secondary | ICD-10-CM

## 2016-05-10 DIAGNOSIS — Z794 Long term (current) use of insulin: Secondary | ICD-10-CM | POA: Diagnosis not present

## 2016-05-10 DIAGNOSIS — F332 Major depressive disorder, recurrent severe without psychotic features: Secondary | ICD-10-CM | POA: Diagnosis not present

## 2016-05-10 DIAGNOSIS — I10 Essential (primary) hypertension: Secondary | ICD-10-CM

## 2016-05-10 DIAGNOSIS — E785 Hyperlipidemia, unspecified: Secondary | ICD-10-CM

## 2016-05-10 DIAGNOSIS — I700 Atherosclerosis of aorta: Secondary | ICD-10-CM

## 2016-05-10 DIAGNOSIS — J1089 Influenza due to other identified influenza virus with other manifestations: Secondary | ICD-10-CM | POA: Diagnosis not present

## 2016-05-10 LAB — BASIC METABOLIC PANEL
ANION GAP: 10 (ref 5–15)
BUN: 5 mg/dL — AB (ref 6–20)
CALCIUM: 7.9 mg/dL — AB (ref 8.9–10.3)
CO2: 25 mmol/L (ref 22–32)
Chloride: 101 mmol/L (ref 101–111)
Creatinine, Ser: 0.91 mg/dL (ref 0.61–1.24)
GFR calc Af Amer: >60 mL/min (ref >60)
GLUCOSE: 181 mg/dL — AB (ref 65–99)
POTASSIUM: 3.8 mmol/L (ref 3.5–5.1)
SODIUM: 136 mmol/L (ref 135–145)

## 2016-05-10 LAB — HIV ANTIBODY (ROUTINE TESTING W REFLEX): HIV SCREEN 4TH GENERATION: NONREACTIVE

## 2016-05-10 LAB — CBC
HEMATOCRIT: 37.4 % — AB (ref 39.0–52.0)
HEMOGLOBIN: 12.7 g/dL — AB (ref 13.0–17.0)
MCH: 30.1 pg (ref 26.0–34.0)
MCHC: 34 g/dL (ref 30.0–36.0)
MCV: 88.6 fL (ref 78.0–100.0)
Platelets: 277 10*3/uL (ref 150–400)
RBC: 4.22 MIL/uL (ref 4.22–5.81)
RDW: 13.8 % (ref 11.5–15.5)
WBC: 8.2 10*3/uL (ref 4.0–10.5)

## 2016-05-10 LAB — GLUCOSE, CAPILLARY: GLUCOSE-CAPILLARY: 184 mg/dL — AB (ref 65–99)

## 2016-05-10 MED ORDER — METFORMIN HCL 500 MG PO TABS: 500.0000 mg | ORAL_TABLET | Freq: Two times a day (BID) | ORAL | 0 refills | Status: DC

## 2016-05-10 MED ORDER — GUAIFENESIN-DM 100-10 MG/5ML PO SYRP: 5.0000 mL | ORAL_SOLUTION | ORAL | Status: DC | PRN

## 2016-05-10 MED ORDER — OSELTAMIVIR PHOSPHATE 75 MG PO CAPS: 75.0000 mg | ORAL_CAPSULE | Freq: Two times a day (BID) | ORAL | 0 refills | Status: AC

## 2016-05-10 MED ORDER — ACETAMINOPHEN 325 MG PO TABS: 650.0000 mg | ORAL_TABLET | Freq: Four times a day (QID) | ORAL | Status: DC | PRN

## 2016-05-10 MED ORDER — OSELTAMIVIR PHOSPHATE 75 MG PO CAPS
75.0000 mg | ORAL_CAPSULE | Freq: Two times a day (BID) | ORAL | Status: DC
  Administered 2016-05-10: 75 mg via ORAL
  Filled 2016-05-10: qty 1

## 2016-05-10 NOTE — Progress Notes (Signed)
Jonathan Austin to be D/C'd Home per MD order.  Discussed prescriptions and follow up appointments with the patient. Prescriptions given to patient, medication list explained in detail. Pt verbalized understanding.  Allergies as of 05/10/2016   No Known Allergies     Medication List    STOP taking these medications   sertraline 50 MG tablet Commonly known as:  ZOLOFT   traZODone 50 MG tablet Commonly known as:  DESYREL     TAKE these medications   acetaminophen 325 MG tablet Commonly known as:  TYLENOL Take 2 tablets (650 mg total) by mouth every 6 (six) hours as needed for mild pain, moderate pain or fever. What changed:  reasons to take this   amLODipine 5 MG tablet Commonly known as:  NORVASC Take 1 tablet (5 mg total) by mouth daily.   ARTIFICIAL TEARS OP Place 1 drop into both eyes daily as needed (dry eyes).   cholecalciferol 1000 units tablet Commonly known as:  VITAMIN D Take 1,000 Units by mouth daily.   insulin aspart protamine- aspart (70-30) 100 UNIT/ML injection Commonly known as:  NOVOLOG MIX 70/30 Inject 0.1 mLs (10 Units total) into the skin 2 (two) times daily with a meal. What changed:  how much to take  when to take this  additional instructions   metFORMIN 500 MG tablet Commonly known as:  GLUCOPHAGE Take 1 tablet (500 mg total) by mouth 2 (two) times daily with a meal. What changed:  medication strength  how much to take   NYQUIL MULTI-SYMPTOM PO Take 1 capsule by mouth at bedtime as needed (sleep).   oseltamivir 75 MG capsule Commonly known as:  TAMIFLU Take 1 capsule (75 mg total) by mouth 2 (two) times daily.   simvastatin 20 MG tablet Commonly known as:  ZOCOR Take 1 tablet (20 mg total) by mouth daily.       Vitals:   05/10/16 0537 05/10/16 0900  BP: 133/81   Pulse: (!) 113   Resp: 16   Temp: (!) 100.7 F (38.2 C) 99.3 F (37.4 C)    Skin clean, dry and intact without evidence of skin break down, no evidence of skin  tears noted. IV catheter discontinued intact. Site without signs and symptoms of complications. Dressing and pressure applied. Pt denies pain at this time. No complaints noted.  An After Visit Summary was printed and given to the patient. Patient escorted via [place], and D/C home via private auto.  [name] RN, BSN

## 2016-05-10 NOTE — Discharge Summary (Signed)
Name: Jonathan Austin MRN: [id number] DOB: 05-21-1965 51 y.o. PCP: No Pcp Per Patient  Date of Admission: 05/09/2016 12:59 PM Date of Discharge: 05/10/2016 Attending Physician: Axel Filler, MD  Discharge Diagnosis:  Principal Problem:   Influenza B Active Problems:   Diabetes mellitus type II, uncontrolled ([place])   Marijuana abuse   Sinus tachycardia   Cocaine abuse with cocaine-induced mood disorder ([place])   Alcohol abuse   HTN (hypertension)   HLD (hyperlipidemia)   AKI (acute kidney injury) ([place])   Severe recurrent major depression without psychotic features [facility])   Thoracic aortic atherosclerosis ([place])   Discharge Medications: Allergies as of 05/10/2016   No Known Allergies     Medication List    STOP taking these medications   sertraline 50 MG tablet Commonly known as:  ZOLOFT   traZODone 50 MG tablet Commonly known as:  DESYREL     TAKE these medications   acetaminophen 325 MG tablet Commonly known as:  TYLENOL Take 2 tablets (650 mg total) by mouth every 6 (six) hours as needed for mild pain, moderate pain or fever. What changed:  reasons to take this   amLODipine 5 MG tablet Commonly known as:  NORVASC Take 1 tablet (5 mg total) by mouth daily.   ARTIFICIAL TEARS OP Place 1 drop into both eyes daily as needed (dry eyes).   cholecalciferol 1000 units tablet Commonly known as:  VITAMIN D Take 1,000 Units by mouth daily.   insulin aspart protamine- aspart (70-30) 100 UNIT/ML injection Commonly known as:  NOVOLOG MIX 70/30 Inject 0.1 mLs (10 Units total) into the skin 2 (two) times daily with a meal. What changed:  how much to take  when to take this  additional instructions   metFORMIN 500 MG tablet Commonly known as:  GLUCOPHAGE Take 1 tablet (500 mg total) by mouth 2 (two) times daily with a meal. What changed:  medication strength  how much to take   NYQUIL MULTI-SYMPTOM PO Take 1 capsule by mouth at bedtime as needed (sleep).   oseltamivir 75 MG capsule Commonly known as:  TAMIFLU Take 1 capsule (75 mg total) by mouth 2 (two) times daily.   simvastatin 20 MG tablet Commonly known as:  ZOCOR Take 1 tablet (20 mg total) by mouth daily.       Disposition and follow-up:   Jonathan Austin was discharged from [facility] in Stable condition.  At the hospital follow up visit please address:  1.  Diabetes.  Uncontrolled DM, taking only low dose insulin 70/30.  Prescribed metformin at discharge, encourage to establish new PCP and follow regularly.  2.  Depression.  Ask about symptoms of depression and SI.  Consider pharmacotherapy.  3.  Cocaine Abuse.  Encourage abstinence, continue working with [facility].  4.  Labs / imaging needed at time of follow-up: none  5.  Pending labs/ test needing follow-up: none  Follow-up Appointments: Follow-up Information    [place]. Schedule an appointment as soon as possible for a visit in 1 week(s).   Why:  Call immediately to make a follow up appointment in 1-2 weeks Contact information: [place] [id number] [phone]          Hospital Course by problem list: Principal Problem:   Influenza B Active Problems:   Diabetes mellitus type II, uncontrolled ([place])   Marijuana abuse   Sinus tachycardia   Cocaine abuse with cocaine-induced mood disorder ([place])  Alcohol abuse   HTN (hypertension)   HLD (hyperlipidemia)   AKI (acute kidney injury) ([place])   Severe recurrent major depression without psychotic features ([place])   Thoracic aortic atherosclerosis ([place])   1. Influenza Mr Jonathan Austin presented with 1 week of fevers, myalgias, cough, anorexia, and nausea and vomiting.  He was found to have influenza B infection complicated by acute renal insufficiency and lactic acidosis secondary to dehydration.  He was volume resuscitated with IV fluids, and AKI and lactic acidosis rapidly resolved  and he improved symptomatically.  On the day after admission he was able to ambulate, eat, and drink, and orthostatics vitals were negative.  He was discharged home with 5 days of Tamiflu, encouragement to drink ample water, and use Tylenol PRN for fevers and myalgias.  2. Diabetes He has uncontrolled daibetes, taking only Novolog 70/30 7-8U BID, and presented with asymptomatic hyperglycemia to the 400s, without DKA or HHS.  Currently his only outpatient contact with the healthcare system is through [facility].  His blood sugars were controlled with subcutaneous insulin and he was discharged with prescription for 1 month of metformin.  The importance of regular clinic follow-up was emphasized, and he was instructed to call [facility] to schedule an appointment.  3. Cocaine Abuse 4. Depression Ongoing cocaine abuse, in outpatient [facility].  Last cocaine use about a week prior to admission.  He has many psychiatric hospitalizations for depression, but denied depression at this time.   Discharge Vitals:   BP 133/81   Pulse (!) 113   Temp 99.3 F (37.4 C) (Oral)   Resp 16   Ht 5\' 7"  (1.702 m)   Wt 208 lb (94.3 kg)   SpO2 96%   BMI 32.58 kg/m   Pertinent Labs, Studies, and Procedures:   CBC Latest Ref Rng & Units 05/10/2016 05/09/2016 02/19/2016  WBC 4.0 - 10.5 K/uL 8.2 6.3 7.3  Hemoglobin 13.0 - 17.0 g/dL 12.7(L) 16.1 15.8  Hematocrit 39.0 - 52.0 % 37.4(L) 47.2 45.1  Platelets 150 - 400 K/uL 277 282 333   CMP Latest Ref Rng & Units 05/10/2016 05/09/2016 02/23/2016  Glucose 65 - 99 mg/dL 181(H) 468(H) 176(H)  BUN 6 - 20 mg/dL 5(L) 8 14  Creatinine 0.61 - 1.24 mg/dL 0.91 1.40(H) 1.06  Sodium 135 - 145 mmol/L 136 132(L) 138  Potassium 3.5 - 5.1 mmol/L 3.8 5.1 3.8  Chloride 101 - 111 mmol/L 101 91(L) 103  CO2 22 - 32 mmol/L 25 30 27   Calcium 8.9 - 10.3 mg/dL 7.9(L) 9.3 8.9  Total Protein 6.5 - 8.1 g/dL - 7.1 -  Total Bilirubin 0.3 - 1.2  mg/dL - 0.6 -  Alkaline Phos 38 - 126 U/L - 51 -  AST 15 - 41 U/L - 23 -  ALT 17 - 63 U/L - 18 -   Component     Latest Ref Rng & Units 05/09/2016 05/09/2016        12:56 PM  4:39 PM  Lactic Acid, Venous     0.5 - 1.9 mmol/L 3.00 (HH) 1.69   Component     Latest Ref Rng & Units 05/09/2016  Influenza A By PCR     NEGATIVE NEGATIVE  Influenza B By PCR     NEGATIVE POSITIVE (A)   Discharge Instructions: Discharge Instructions    Call MD for:  difficulty breathing, headache or visual disturbances    Complete by:  As directed    Call  MD for:  persistant nausea and vomiting    Complete by:  As directed    Diet - low sodium heart healthy    Complete by:  As directed    Increase activity slowly    Complete by:  As directed     You were admitted to the hospital due to dehydration because you have the flu.  We gave you IV fluids and started you on Tamiflu to treat the dehydration and flu.  I have prescribed you Tamiflu to take for the next 4 days.  Try and drink lots of water to stay hydrated.  You can take Tylenol, no more than 3000 mg per day, for fevers and muscle aches.  Keep taking your insulin 70/30 as you have been.  I have also given you a prescription for metformin.  It is very important that you see a primary care doctor regularly to manage your diabetes, high blood pressure, and depression.  Please call [facility] for an appointment in 1-2 weeks.  Signed: Minus Liberty, MD 05/10/2016, 10:36 AM   Pager: [phone]

## 2016-05-10 NOTE — Progress Notes (Signed)
Subjective: Feeling much better today, with less fatigue, body aches, and headache.  Still coughing with small amount of mucous but no shortness of breath.  Objective:  Vital signs in last 24 hours: Vitals:   05/09/16 1900 05/09/16 2107 05/09/16 2238 05/10/16 0537  BP: (!) 135/98 121/83  133/81  Pulse: 98 (!) 104  (!) 113  Resp: (!) 21 15  16   Temp:  98.1 F (36.7 C)  (!) 100.7 F (38.2 C)  TempSrc:      SpO2: 95% 97%  96%  Weight:   208 lb (94.3 kg)   Height:   5\' 7"  (1.702 m)    Physical Exam  Constitutional: He is oriented to person, place, and time.  Well developed man sitting in chair in no distress  Cardiovascular: Normal rate and regular rhythm.   Pulmonary/Chest: Effort normal and breath sounds normal. No respiratory distress. He has no wheezes. He has no rales.  Neurological: He is alert and oriented to person, place, and time.  Psychiatric: He has a normal mood and affect. His behavior is normal.    CBC Latest Ref Rng & Units 05/10/2016 05/09/2016 02/19/2016  WBC 4.0 - 10.5 K/uL 8.2 6.3 7.3  Hemoglobin 13.0 - 17.0 g/dL 12.7(L) 16.1 15.8  Hematocrit 39.0 - 52.0 % 37.4(L) 47.2 45.1  Platelets 150 - 400 K/uL 277 282 333   CMP Latest Ref Rng & Units 05/10/2016 05/09/2016 02/23/2016  Glucose 65 - 99 mg/dL 181(H) 468(H) 176(H)  BUN 6 - 20 mg/dL 5(L) 8 14  Creatinine 0.61 - 1.24 mg/dL 0.91 1.40(H) 1.06  Sodium 135 - 145 mmol/L 136 132(L) 138  Potassium 3.5 - 5.1 mmol/L 3.8 5.1 3.8  Chloride 101 - 111 mmol/L 101 91(L) 103  CO2 22 - 32 mmol/L 25 30 27   Calcium 8.9 - 10.3 mg/dL 7.9(L) 9.3 8.9  Total Protein 6.5 - 8.1 g/dL - 7.1 -  Total Bilirubin 0.3 - 1.2 mg/dL - 0.6 -  Alkaline Phos 38 - 126 U/L - 51 -  AST 15 - 41 U/L - 23 -  ALT 17 - 63 U/L - 18 -     Component     Latest Ref Rng & Units 01/18/2016 05/09/2016  Influenza A By PCR     NEGATIVE POSITIVE (A) NEGATIVE  Influenza B By PCR     NEGATIVE NEGATIVE POSITIVE (A)   Assessment/Plan:  Principal Problem:  Viral URI with cough Active Problems:   Diabetes mellitus type II, uncontrolled (HCC)   Marijuana abuse   Sinus tachycardia   Cocaine abuse with cocaine-induced mood disorder (HCC)   Alcohol abuse   HTN (hypertension)   HLD (hyperlipidemia)   Severe recurrent major depression without psychotic features (HCC)   Thoracic aortic atherosclerosis ([address])   51 y.o. male with 1 week of cough, fevers, and generalized malaise from influenza infection complicated by AKI and lactic acidosis due to hypovolemia from vomiting and reduced oral intake.  Afebrile and normotensive here without leukocytosis, dyspnea, or consolidation/infiltrate on CXR exclude pneumonia.  He is markedly improved with IVF and will be ready to continue recuperating at home when he can take oral nutrition and hydration.  #Influenza Supportive care with IVF, antiemetics, Tylenol, antitussives, mucolytics. -oseltamavir 75 mg daily for 5 days -mIVF -Tylenol, zofran PRN -Check orthostatics -Encourage oral nutrition/hydration  #Acute on Chronic Renal Insufficiency #Lactic Acidosis Resolved.  Baseline Cr ~1.0, up to 1.4 on admission.  Likely prerenal due to vomiting and reduced oral hydration. -mIVF NS  125 mL/hr  #DM #Asymptomatic Hyperglycemia Taking NovoLog 70/30 7U qAM 8U qPM, none today.  Hyperglycemic to 400s at admission, no acidosis, no altered mental status. -NovoLog 70/30 5U BID -SSI  #HTN Mildly hypertensive, not taking any antihypertensives at home. -Monitor BPs -Initiate/resume amlodipine if needed -Outpatient follow-up  #Depression Reports mood is good, not taking any psych meds.  Does not have a PCP or psychiatrist.  Sees counselors and physicians through [facility]. -[facility] follow-up  #Alcohol Abuse Drinking about 24 beers per week recently.  No history of withdrawals. -CIWA -Ativan PRN -Thiamine, folate, MV  #Cocaine Abuse Last use 1 week ago. -Follow-up with  [facility]  Fluids: NS 125 mL/hr Diet: carb DVT Prophylaxis: lovenox Code Status: full  Dispo: Anticipated discharge in approximately 0-1 day(s).   Minus Liberty, MD 05/10/2016, 6:31 AM Pager: [phone]

## 2016-05-10 NOTE — Discharge Instructions (Addendum)
You were admitted to the hospital due to dehydration because you have the flu.  We gave you IV fluids and started you on Tamiflu to treat the dehydration and flu.  I have prescribed you Tamiflu to take for the next 4 days.  Try and drink lots of water to stay hydrated.  You can take Tylenol, no more than 3000 mg per day, for fevers and muscle aches.  Keep taking your insulin 70/30 as you have been.  I have also given you a prescription for metformin.  It is very important that you see a primary care doctor regularly to manage your diabetes, high blood pressure, and depression.  Please call [facility] for an appointment in 1-2 weeks.

## 2016-05-12 ENCOUNTER — Inpatient Hospital Stay: Payer: Self-pay

## 2016-05-14 LAB — CULTURE, BLOOD (ROUTINE X 2)
CULTURE: NO GROWTH
CULTURE: NO GROWTH

## 2016-07-15 ENCOUNTER — Encounter (HOSPITAL_COMMUNITY): Payer: Self-pay | Admitting: *Deleted

## 2016-07-15 ENCOUNTER — Emergency Department (HOSPITAL_COMMUNITY)
Admission: EM | Admit: 2016-07-15 | Discharge: 2016-07-16 | Disposition: A | Discharge status: Other Acute Inpatient Hospital | Payer: Self-pay | Attending: Emergency Medicine | Admitting: Emergency Medicine

## 2016-07-15 ENCOUNTER — Emergency Department (HOSPITAL_COMMUNITY): Payer: Self-pay

## 2016-07-15 DIAGNOSIS — F1410 Cocaine abuse, uncomplicated: Secondary | ICD-10-CM | POA: Insufficient documentation

## 2016-07-15 DIAGNOSIS — Z794 Long term (current) use of insulin: Secondary | ICD-10-CM | POA: Insufficient documentation

## 2016-07-15 DIAGNOSIS — Z79899 Other long term (current) drug therapy: Secondary | ICD-10-CM | POA: Insufficient documentation

## 2016-07-15 DIAGNOSIS — R45851 Suicidal ideations: Secondary | ICD-10-CM | POA: Insufficient documentation

## 2016-07-15 DIAGNOSIS — R51 Headache: Secondary | ICD-10-CM | POA: Insufficient documentation

## 2016-07-15 DIAGNOSIS — I10 Essential (primary) hypertension: Secondary | ICD-10-CM | POA: Insufficient documentation

## 2016-07-15 DIAGNOSIS — E119 Type 2 diabetes mellitus without complications: Secondary | ICD-10-CM | POA: Insufficient documentation

## 2016-07-15 DIAGNOSIS — F329 Major depressive disorder, single episode, unspecified: Secondary | ICD-10-CM | POA: Insufficient documentation

## 2016-07-15 LAB — CBC
HCT: 43.4 % (ref 39.0–52.0)
Hemoglobin: 14.9 g/dL (ref 13.0–17.0)
MCH: 30.4 pg (ref 26.0–34.0)
MCHC: 34.3 g/dL (ref 30.0–36.0)
MCV: 88.6 fL (ref 78.0–100.0)
PLATELETS: 313 10*3/uL (ref 150–400)
RBC: 4.9 MIL/uL (ref 4.22–5.81)
RDW: 13.4 % (ref 11.5–15.5)
WBC: 7.7 10*3/uL (ref 4.0–10.5)

## 2016-07-15 LAB — ETHANOL

## 2016-07-15 LAB — COMPREHENSIVE METABOLIC PANEL
ALT: 24 U/L (ref 17–63)
AST: 22 U/L (ref 15–41)
Albumin: 3.9 g/dL (ref 3.5–5.0)
Alkaline Phosphatase: 58 U/L (ref 38–126)
Anion gap: 9 (ref 5–15)
BILIRUBIN TOTAL: 0.9 mg/dL (ref 0.3–1.2)
CALCIUM: 9 mg/dL (ref 8.9–10.3)
CO2: 26 mmol/L (ref 22–32)
Chloride: 100 mmol/L — ABNORMAL LOW (ref 101–111)
Creatinine, Ser: 0.97 mg/dL (ref 0.61–1.24)
GFR calc Af Amer: >60 mL/min (ref >60)
Glucose, Bld: 202 mg/dL — ABNORMAL HIGH (ref 65–99)
POTASSIUM: 3.6 mmol/L (ref 3.5–5.1)
Sodium: 135 mmol/L (ref 135–145)
TOTAL PROTEIN: 6.9 g/dL (ref 6.5–8.1)

## 2016-07-15 LAB — RAPID URINE DRUG SCREEN, HOSP PERFORMED
Amphetamines: NOT DETECTED
Barbiturates: NOT DETECTED
Benzodiazepines: NOT DETECTED
Cocaine: POSITIVE — AB
Opiates: NOT DETECTED
Tetrahydrocannabinol: NOT DETECTED

## 2016-07-15 LAB — SALICYLATE LEVEL: Salicylate Lvl: <7 mg/dL (ref 2.8–30.0)

## 2016-07-15 LAB — ACETAMINOPHEN LEVEL: Acetaminophen (Tylenol), Serum: <10 ug/mL — ABNORMAL LOW (ref 10–30)

## 2016-07-15 NOTE — ED Notes (Signed)
Staffing office notified of the need for a sitter.  Chg rn notified  Security to wand him

## 2016-07-15 NOTE — ED Provider Notes (Signed)
[facility] DEPT Provider Note   CSN: [id number] Arrival date & time: 07/15/16  2141    By signing my name below, I, [name], attest that this documentation has been prepared under the direction and in the presence of Jamelia Varano, Annie Main, MD . Electronically Signed: [name], ED Scribe. 07/15/2016. 11:32 PM.  History   Chief Complaint Chief Complaint  Patient presents with  . Psychiatric Evaluation    HPI Jonathan Austin is a 51 y.o. male who presents to the Emergency Department complaining of depression and thoughts of suicide. Pt reports his plan to harm himself is by overdosing on sleeping medications. Pt reports associated gradually worsening headache x 5 hours ago after smoking crack. He notes that he hasn't tried any medication for the relief of his HA. Pt states that he used to take medications for depression but d/c use 1 year ago. Pt notes that he is an occasional ETOH and crack cocaine user and that he consumed approximately two 40 ounce beers and crack cocaine prior to the onset of his symptoms. He states that he also regularly smokes marijuana. Denies HI, weakness, numbness, tingling, ETOH induced seizures, visual/auditory hallucinations, vomiting, abdominal pain, CP, difficulty urinating, constipation, diarrhea, trouble swallowing, and any other symptoms. He states that he takes daily insulin and metformin for his hx of DM.     The history is provided by the patient. No language interpreter was used.    Past Medical History:  Diagnosis Date  . Cellulitis 06/28/2013  . Cocaine abuse 06/28/2013  . Cocaine abuse with cocaine-induced mood disorder ([place])   . Depression   . Diabetes mellitus   . High cholesterol   . Hypertension   . Scrotal infection 06/28/2013  . Sleep apnea   . Suicidal ideations     Patient Active Problem List   Diagnosis Date Noted  . Thoracic aortic atherosclerosis ([place]) 05/09/2016  . Influenza B 05/09/2016  . Dehydration   . Lactic  acidosis   . Severe recurrent major depression without psychotic features ([employer]) 02/21/2016  . AKI (acute kidney injury) ([place]) 01/20/2016  . HTN (hypertension) 09/20/2015  . HLD (hyperlipidemia) 09/20/2015  . Substance abuse 08/30/2014  . Substance induced mood disorder ([place]) 05/07/2014  . Cocaine abuse with cocaine-induced mood disorder ([place])   . Alcohol abuse   . Sinus tachycardia 02/12/2014  . Marijuana abuse 06/28/2013  . Diabetes mellitus type II, uncontrolled ([place]) 12/09/2012    Past Surgical History:  Procedure Laterality Date  . TONSILLECTOMY         Home Medications    Prior to Admission medications   Medication Sig Start Date End Date Taking? Authorizing Provider  acetaminophen (TYLENOL) 325 MG tablet Take 2 tablets (650 mg total) by mouth every 6 (six) hours as needed for mild pain, moderate pain or fever. 05/10/16   Minus Liberty, MD  amLODipine (NORVASC) 5 MG tablet Take 1 tablet (5 mg total) by mouth daily. 02/27/16   Kerrie Buffalo, NP  cholecalciferol (VITAMIN D) 1000 units tablet Take 1,000 Units by mouth daily.    [provider]  Hypromellose (ARTIFICIAL TEARS OP) Place 1 drop into both eyes daily as needed (dry eyes).    [provider]  insulin aspart protamine- aspart (NOVOLOG MIX 70/30) (70-30) 100 UNIT/ML injection Inject 0.1 mLs (10 Units total) into the skin 2 (two) times daily with a meal. Patient taking differently: Inject 7-8 Units into the skin See admin instructions. 7 units in am, 8 units in pm 02/26/16  Kerrie Buffalo, NP  metFORMIN (GLUCOPHAGE) 500 MG tablet Take 1 tablet (500 mg total) by mouth 2 (two) times daily with a meal. 05/10/16 06/09/16  Minus Liberty, MD  Pseudoeph-Doxylamine-DM-APAP (NYQUIL MULTI-SYMPTOM PO) Take 1 capsule by mouth at bedtime as needed (sleep).    [provider]  simvastatin (ZOCOR) 20 MG tablet Take 1 tablet (20 mg total) by mouth daily. 02/27/16   Kerrie Buffalo, NP    Family  History Family History  Problem Relation Age of Onset  . Hypertension Mother   . Hypertension Father   . Diabetes Father     Social History Social History  Substance Use Topics  . Smoking status: Never Smoker  . Smokeless tobacco: Never Used  . Alcohol use 1.8 oz/week    1 Glasses of wine, 1 Cans of beer, 1 Shots of liquor per week     Comment: Occasionally     Allergies   Patient has no known allergies.   Review of Systems Review of Systems All systems reviewed and are negative for acute change except as noted in the HPI.    Physical Exam Updated Vital Signs BP (!) 159/98   Pulse (!) 128   Temp 98.1 F (36.7 C)   Resp 16   Ht 5\' 7"  (1.702 m)   Wt 206 lb 6 oz (93.6 kg)   SpO2 95%   BMI 32.32 kg/m   Physical Exam  Constitutional: He is oriented to person, place, and time. He appears well-developed and well-nourished. No distress.  HENT:  Head: Normocephalic and atraumatic.  Mouth/Throat: Oropharynx is clear and moist. No oropharyngeal exudate.  Eyes: Conjunctivae and EOM are normal. Pupils are equal, round, and reactive to light.  Neck: Normal range of motion. Neck supple.  No meningismus.  Cardiovascular: Regular rhythm, normal heart sounds and intact distal pulses.  Tachycardia present.   No murmur heard. Tachycardic.  Pulmonary/Chest: Effort normal and breath sounds normal. No respiratory distress.  Abdominal: Soft. There is no tenderness. There is no rebound and no guarding.  Musculoskeletal: Normal range of motion. He exhibits no edema or tenderness.  Neurological: He is alert and oriented to person, place, and time. No cranial nerve deficit. He exhibits normal muscle tone. Coordination normal.   5/5 strength throughout. CN 2-12 intact.Equal grip strength.   Skin: Skin is warm.  Psychiatric: He has a normal mood and affect. His behavior is normal.  Flat affect.  Nursing note and vitals reviewed.    ED Treatments / Results  DIAGNOSTIC  STUDIES:  DIAGNOSTIC STUDIES:  Oxygen Saturation is 95% on RA, adequate by my interpretation  COORDINATION OF CARE:  11:42 PM Discussed treatment plan with pt at bedside and pt agreed to plan.     COORDINATION OF CARE:   Discussed treatment plan with pt at bedside and pt agreed to plan.  Labs (all labs ordered are listed, but only abnormal results are displayed) Labs Reviewed  COMPREHENSIVE METABOLIC PANEL - Abnormal; Notable for the following:       Result Value   Chloride 100 (*)    Glucose, Bld 202 (*)    BUN <5 (*)    All other components within normal limits  ACETAMINOPHEN LEVEL - Abnormal; Notable for the following:    Acetaminophen (Tylenol), Serum <10 (*)    All other components within normal limits  RAPID URINE DRUG SCREEN, HOSP PERFORMED - Abnormal; Notable for the following:    Cocaine POSITIVE (*)    All other components within normal  limits  ETHANOL  SALICYLATE LEVEL  CBC    EKG  EKG Interpretation None       Radiology Ct Head Wo Contrast  Result Date: 07/16/2016 CLINICAL DATA:  Depression, suicidal thoughts, cocaine use 1 hour ago. EXAM: CT HEAD WITHOUT CONTRAST TECHNIQUE: Contiguous axial images were obtained from the base of the skull through the vertex without intravenous contrast. COMPARISON:  None. FINDINGS: Brain: No evidence of acute infarction, hemorrhage, hydrocephalus, extra-axial collection or mass lesion/mass effect. Vascular: No hyperdense vessel or unexpected calcification. Skull: Normal. Negative for fracture or focal lesion. Sinuses/Orbits: No acute finding. Other: None IMPRESSION: No acute intracranial abnormality. Electronically Signed   By: Ashley Royalty M.D.   On: 07/16/2016 00:03    Procedures Procedures (including critical care time)  Medications Ordered in ED Medications - No data to display   Initial Impression / Assessment and Plan / ED Course  I have reviewed the triage vital signs and the nursing notes.  Pertinent labs &  imaging results that were available during my care of the patient were reviewed by me and considered in my medical decision making (see chart for details).     Patient presents with suicidal thoughts. Plan to overdose. Admits to cocaine use and alcohol use. Denies any homicidal thoughts or hallucinations.  Labs with hyperglycemia without DKA. Gradual onset headache after cocaine use this evening. CT head negative.  Screening labs are reassuring. Patient is medically clear for psychiatric evaluation. Holding orders placed. Tachycardia has improved to 86.  CIWA orders placed.   Final Clinical Impressions(s) / ED Diagnoses   Final diagnoses:  Suicidal ideation  Cocaine abuse    New Prescriptions New Prescriptions   No medications on file    I personally performed the services described in this documentation, which was scribed in my presence. The recorded information has been reviewed and is accurate.    Ezequiel Essex, MD 07/16/16 [phone]

## 2016-07-15 NOTE — ED Notes (Signed)
Pt.s personal belongings given to wife at triage.

## 2016-07-15 NOTE — ED Triage Notes (Signed)
The pt reports that he is suicidal   He last used coaine   He smoked it  One hour ago.  Hx of depression and he has been in a mental facility in the past.  Alert oriented  Very poor hygiene

## 2016-07-16 ENCOUNTER — Encounter (HOSPITAL_COMMUNITY): Payer: Self-pay

## 2016-07-16 ENCOUNTER — Inpatient Hospital Stay (HOSPITAL_COMMUNITY)
Admission: AD | Admit: 2016-07-16 | Discharge: 2016-07-19 | DRG: 885 | Disposition: A | Discharge status: Home / Self Care | Payer: No Typology Code available for payment source | Source: Intra-hospital | Attending: Psychiatry | Admitting: Psychiatry

## 2016-07-16 ENCOUNTER — Encounter (HOSPITAL_COMMUNITY): Payer: Self-pay | Admitting: *Deleted

## 2016-07-16 DIAGNOSIS — Z560 Unemployment, unspecified: Secondary | ICD-10-CM

## 2016-07-16 DIAGNOSIS — E119 Type 2 diabetes mellitus without complications: Secondary | ICD-10-CM | POA: Diagnosis present

## 2016-07-16 DIAGNOSIS — I10 Essential (primary) hypertension: Secondary | ICD-10-CM | POA: Diagnosis present

## 2016-07-16 DIAGNOSIS — Z79899 Other long term (current) drug therapy: Secondary | ICD-10-CM | POA: Diagnosis not present

## 2016-07-16 DIAGNOSIS — F1020 Alcohol dependence, uncomplicated: Secondary | ICD-10-CM | POA: Diagnosis present

## 2016-07-16 DIAGNOSIS — F339 Major depressive disorder, recurrent, unspecified: Secondary | ICD-10-CM | POA: Diagnosis not present

## 2016-07-16 DIAGNOSIS — Z8249 Family history of ischemic heart disease and other diseases of the circulatory system: Secondary | ICD-10-CM | POA: Diagnosis not present

## 2016-07-16 DIAGNOSIS — F1490 Cocaine use, unspecified, uncomplicated: Secondary | ICD-10-CM | POA: Diagnosis not present

## 2016-07-16 DIAGNOSIS — R45851 Suicidal ideations: Secondary | ICD-10-CM | POA: Diagnosis present

## 2016-07-16 DIAGNOSIS — Z794 Long term (current) use of insulin: Secondary | ICD-10-CM | POA: Diagnosis not present

## 2016-07-16 DIAGNOSIS — F332 Major depressive disorder, recurrent severe without psychotic features: Secondary | ICD-10-CM | POA: Diagnosis present

## 2016-07-16 DIAGNOSIS — G4700 Insomnia, unspecified: Secondary | ICD-10-CM | POA: Diagnosis not present

## 2016-07-16 DIAGNOSIS — F1414 Cocaine abuse with cocaine-induced mood disorder: Secondary | ICD-10-CM | POA: Diagnosis present

## 2016-07-16 DIAGNOSIS — Z833 Family history of diabetes mellitus: Secondary | ICD-10-CM | POA: Diagnosis not present

## 2016-07-16 DIAGNOSIS — F1420 Cocaine dependence, uncomplicated: Secondary | ICD-10-CM | POA: Clinically undetermined

## 2016-07-16 DIAGNOSIS — F1290 Cannabis use, unspecified, uncomplicated: Secondary | ICD-10-CM | POA: Diagnosis not present

## 2016-07-16 DIAGNOSIS — E785 Hyperlipidemia, unspecified: Secondary | ICD-10-CM | POA: Diagnosis present

## 2016-07-16 DIAGNOSIS — Z818 Family history of other mental and behavioral disorders: Secondary | ICD-10-CM

## 2016-07-16 DIAGNOSIS — F331 Major depressive disorder, recurrent, moderate: Secondary | ICD-10-CM

## 2016-07-16 DIAGNOSIS — F1994 Other psychoactive substance use, unspecified with psychoactive substance-induced mood disorder: Secondary | ICD-10-CM

## 2016-07-16 LAB — GLUCOSE, CAPILLARY
GLUCOSE-CAPILLARY: 226 mg/dL — AB (ref 65–99)
GLUCOSE-CAPILLARY: 234 mg/dL — AB (ref 65–99)
Glucose-Capillary: 293 mg/dL — ABNORMAL HIGH (ref 65–99)

## 2016-07-16 LAB — CBG MONITORING, ED: GLUCOSE-CAPILLARY: 184 mg/dL — AB (ref 65–99)

## 2016-07-16 MED ORDER — AMLODIPINE BESYLATE 5 MG PO TABS: 5.0000 mg | ORAL_TABLET | Freq: Every day | ORAL | Status: DC

## 2016-07-16 MED ORDER — LORAZEPAM 1 MG PO TABS: 0.0000 mg | ORAL_TABLET | Freq: Four times a day (QID) | ORAL | Status: DC

## 2016-07-16 MED ORDER — LORAZEPAM 2 MG/ML IJ SOLN: 1.0000 mg | Freq: Four times a day (QID) | INTRAMUSCULAR | Status: DC | PRN

## 2016-07-16 MED ORDER — ACETAMINOPHEN 325 MG PO TABS: 650.0000 mg | ORAL_TABLET | Freq: Four times a day (QID) | ORAL | Status: DC | PRN

## 2016-07-16 MED ORDER — SERTRALINE HCL 25 MG PO TABS
25.0000 mg | ORAL_TABLET | Freq: Every day | ORAL | Status: DC
  Administered 2016-07-16 – 2016-07-19 (×4): 25 mg via ORAL
  Filled 2016-07-16 (×6): qty 1

## 2016-07-16 MED ORDER — INSULIN ASPART PROT & ASPART (70-30 MIX) 100 UNIT/ML ~~LOC~~ SUSP
8.0000 [IU] | Freq: Every day | SUBCUTANEOUS | Status: DC
  Filled 2016-07-16: qty 10

## 2016-07-16 MED ORDER — INSULIN ASPART PROT & ASPART (70-30 MIX) 100 UNIT/ML ~~LOC~~ SUSP
7.0000 [IU] | Freq: Every day | SUBCUTANEOUS | Status: DC
  Administered 2016-07-16: 7 [IU] via SUBCUTANEOUS

## 2016-07-16 MED ORDER — AMLODIPINE BESYLATE 5 MG PO TABS
5.0000 mg | ORAL_TABLET | Freq: Every day | ORAL | Status: DC
  Administered 2016-07-16 – 2016-07-19 (×4): 5 mg via ORAL
  Filled 2016-07-16 (×6): qty 1

## 2016-07-16 MED ORDER — METFORMIN HCL 500 MG PO TABS: 500.0000 mg | ORAL_TABLET | Freq: Two times a day (BID) | ORAL | Status: DC

## 2016-07-16 MED ORDER — INSULIN ASPART 100 UNIT/ML ~~LOC~~ SOLN
0.0000 [IU] | Freq: Three times a day (TID) | SUBCUTANEOUS | Status: DC
  Administered 2016-07-16: 8 [IU] via SUBCUTANEOUS
  Administered 2016-07-16: 5 [IU] via SUBCUTANEOUS
  Administered 2016-07-17: 11 [IU] via SUBCUTANEOUS
  Administered 2016-07-17: 5 [IU] via SUBCUTANEOUS
  Administered 2016-07-17 – 2016-07-18 (×2): 8 [IU] via SUBCUTANEOUS
  Administered 2016-07-18 (×2): 5 [IU] via SUBCUTANEOUS
  Administered 2016-07-19: 3 [IU] via SUBCUTANEOUS

## 2016-07-16 MED ORDER — LORAZEPAM 1 MG PO TABS: 1.0000 mg | ORAL_TABLET | Freq: Four times a day (QID) | ORAL | Status: DC | PRN

## 2016-07-16 MED ORDER — SIMVASTATIN 20 MG PO TABS
20.0000 mg | ORAL_TABLET | Freq: Every day | ORAL | Status: DC
  Administered 2016-07-16 – 2016-07-19 (×4): 20 mg via ORAL
  Filled 2016-07-16 (×6): qty 1

## 2016-07-16 MED ORDER — SIMVASTATIN 20 MG PO TABS
20.0000 mg | ORAL_TABLET | Freq: Every day | ORAL | Status: DC
  Filled 2016-07-16: qty 1

## 2016-07-16 MED ORDER — HYDROCORTISONE 1 % EX CREA: TOPICAL_CREAM | Freq: Three times a day (TID) | CUTANEOUS | Status: DC | PRN

## 2016-07-16 MED ORDER — INSULIN ASPART PROT & ASPART (70-30 MIX) 100 UNIT/ML ~~LOC~~ SUSP
10.0000 [IU] | Freq: Two times a day (BID) | SUBCUTANEOUS | Status: DC
  Administered 2016-07-16 – 2016-07-18 (×4): 10 [IU] via SUBCUTANEOUS

## 2016-07-16 MED ORDER — METFORMIN HCL 500 MG PO TABS
500.0000 mg | ORAL_TABLET | Freq: Two times a day (BID) | ORAL | Status: DC
Start: 2016-07-16 — End: 2016-07-19
  Administered 2016-07-16 – 2016-07-19 (×7): 500 mg via ORAL
  Filled 2016-07-16 (×10): qty 1

## 2016-07-16 MED ORDER — MAGNESIUM HYDROXIDE 400 MG/5ML PO SUSP: 30.0000 mL | Freq: Every day | ORAL | Status: DC | PRN

## 2016-07-16 MED ORDER — HYDROXYZINE HCL 25 MG PO TABS
25.0000 mg | ORAL_TABLET | Freq: Four times a day (QID) | ORAL | Status: DC | PRN
  Administered 2016-07-16 – 2016-07-18 (×3): 25 mg via ORAL
  Filled 2016-07-16 (×2): qty 1
  Filled 2016-07-16: qty 12
  Filled 2016-07-16: qty 1

## 2016-07-16 MED ORDER — VITAMIN B-1 100 MG PO TABS: 100.0000 mg | ORAL_TABLET | Freq: Every day | ORAL | Status: DC

## 2016-07-16 MED ORDER — LORAZEPAM 1 MG PO TABS: 0.0000 mg | ORAL_TABLET | Freq: Two times a day (BID) | ORAL | Status: DC

## 2016-07-16 MED ORDER — ALUM & MAG HYDROXIDE-SIMETH 200-200-20 MG/5ML PO SUSP: 30.0000 mL | ORAL | Status: DC | PRN

## 2016-07-16 MED ORDER — TRAZODONE HCL 50 MG PO TABS
50.0000 mg | ORAL_TABLET | Freq: Every evening | ORAL | Status: DC | PRN
  Administered 2016-07-16 – 2016-07-18 (×3): 50 mg via ORAL
  Filled 2016-07-16 (×3): qty 1
  Filled 2016-07-16: qty 7

## 2016-07-16 MED ORDER — FOLIC ACID 1 MG PO TABS: 1.0000 mg | ORAL_TABLET | Freq: Every day | ORAL | Status: DC

## 2016-07-16 MED ORDER — THIAMINE HCL 100 MG/ML IJ SOLN: 100.0000 mg | Freq: Every day | INTRAMUSCULAR | Status: DC

## 2016-07-16 MED ORDER — ADULT MULTIVITAMIN W/MINERALS CH: 1.0000 | ORAL_TABLET | Freq: Every day | ORAL | Status: DC

## 2016-07-16 MED ORDER — INSULIN ASPART 100 UNIT/ML ~~LOC~~ SOLN
0.0000 [IU] | Freq: Three times a day (TID) | SUBCUTANEOUS | Status: DC
  Administered 2016-07-16: 2 [IU] via SUBCUTANEOUS
  Filled 2016-07-16: qty 1

## 2016-07-16 NOTE — Tx Team (Signed)
Initial Treatment Plan 07/16/2016 10:06 AM Jonathan Austin [id number]    PATIENT STRESSORS: Financial difficulties Loss of child Substance abuse   PATIENT STRENGTHS: Ability for insight Capable of independent living Physical Health Religious Affiliation   PATIENT IDENTIFIED PROBLEMS: Depression  Grieving  Substance abuse  "Sobriety"  "Rehab from drugs and alcohol"             DISCHARGE CRITERIA:  Improved stabilization in mood, thinking, and/or behavior Verbal commitment to aftercare and medication compliance  PRELIMINARY DISCHARGE PLAN: Outpatient therapy Return to previous living arrangement  PATIENT/FAMILY INVOLVEMENT: This treatment plan has been presented to and reviewed with the patient, Jonathan Austin.  The patient has been given the opportunity to ask questions and make suggestions.  Cheri Kearns, RN 07/16/2016, 10:06 AM

## 2016-07-16 NOTE — Progress Notes (Signed)
D.  Pt pleasant on approach, first night on unit.  Pt stated he is tired and just wants to go to bed.  Pt denies SI/HI/hallucinations at this time.  Minimal interaction tonight but it is his first night.  A.  Support and encouragement offered, medication given as ordered  R.  Pt remain safe on the unit, will continue to monitor.
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
## 2016-07-16 NOTE — ED Provider Notes (Signed)
Patient accepted to [facility] by Dr. Parke Poisson.  BP 116/74   Pulse 86   Temp 98 F (36.7 C) (Oral)   Resp 17   Ht 5\' 7"  (1.702 m)   Wt 93.6 kg (206 lb 6 oz)   SpO2 98%   BMI 32.32 kg/m     Ezequiel Essex, MD 07/16/16 [phone]

## 2016-07-16 NOTE — Progress Notes (Signed)
Patient did not attend group,

## 2016-07-16 NOTE — ED Notes (Signed)
Ordered breakfast tray  
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
## 2016-07-16 NOTE — Progress Notes (Signed)
Patient placed on CPAP for the evening without complication. Patient placed on home settings he reported (12cmH2O). RT will continue to monitor as needed.

## 2016-07-16 NOTE — BHH Group Notes (Signed)
[facility] Group Notes:  (Nursing/MHT/Case Management/Adjunct)  Date:  07/16/2016  Time:  1315  Type of Therapy:  Nurse Education  Participation Level:  Active  Participation Quality:  Attentive  Affect:  Appropriate  Cognitive:  Alert  Insight:  Improving  Engagement in Group:  Engaged  Modes of Intervention:  Discussion and Education  Summary of Progress/Problems: Nurse led exercise on improving, strengthening self esteem.    Jonathan Austin 07/16/2016, 2:30 PM

## 2016-07-16 NOTE — ED Notes (Signed)
Called [facility] for transport. 

## 2016-07-16 NOTE — BHH Group Notes (Signed)
[name] Group Notes:  (Clinical Social Work)   12/06/2014     10:00-11:00AM  Summary of Progress/Problems:   In today's process group a decisional balance exercise was used to explore in depth the perceived benefits and costs of alcohol and drugs, as well as the  benefits and costs of replacing these with healthy coping skills.  Patients listed healthy and unhealthy coping techniques, determining with CSW guidance that unhealthy coping techniques work initially, but eventually become harmful.  Motivational Interviewing and the whiteboard were utilized for the exercises.  The patient did not arrive until about 40 minutes of group had already passed.  He came into group and sat down, but seemed to doze off and when asked a question, had to be awakened.  He did not contribute to the discussion, only answering with his name.  Type of Therapy:  Group Therapy - Process   Participation Level:  None  Participation Quality:  Drowsy  Affect:  Blunted  Cognitive:  Not assessed  Insight:  None  Engagement in Therapy:  Poor  Modes of Intervention:  Education, Motivational Interviewing  Selmer Dominion, LCSW 07/16/2016, 3:33 PM
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
## 2016-07-17 DIAGNOSIS — F1490 Cocaine use, unspecified, uncomplicated: Secondary | ICD-10-CM

## 2016-07-17 DIAGNOSIS — F339 Major depressive disorder, recurrent, unspecified: Secondary | ICD-10-CM

## 2016-07-17 DIAGNOSIS — G4700 Insomnia, unspecified: Secondary | ICD-10-CM

## 2016-07-17 DIAGNOSIS — F1290 Cannabis use, unspecified, uncomplicated: Secondary | ICD-10-CM

## 2016-07-17 LAB — GLUCOSE, CAPILLARY
GLUCOSE-CAPILLARY: 236 mg/dL — AB (ref 65–99)
GLUCOSE-CAPILLARY: 330 mg/dL — AB (ref 65–99)
Glucose-Capillary: 273 mg/dL — ABNORMAL HIGH (ref 65–99)
Glucose-Capillary: 305 mg/dL — ABNORMAL HIGH (ref 65–99)
Glucose-Capillary: 262 mg/dL — ABNORMAL HIGH (ref 65–99)

## 2016-07-17 NOTE — Plan of Care (Signed)
Problem: Activity: Goal: Interest or engagement in leisure activities will improve Outcome: Progressing PAtient has been attending all unit activities, and interacting with peers in bright and appropriate manner.
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
## 2016-07-17 NOTE — Progress Notes (Signed)
[facility] MD Progress Note  07/17/2016 5:45 PM Jonathan Austin  MRN:  [id number] Subjective:  Patient reports " I am doing okay today, I have a follow-up with family services of the [place]."  Objective: Jonathan Austin is awake, alert and oriented *3.  found attending group session.  Denies suicidal or homicidal ideation during this assessment. Denies auditory or visual hallucination and does not appear to be responding to internal stimuli. Patient interacts well with staff and others.  Patient reports he is medication compliant without mediation side effects. Reports good appetite and rep rots resting well.Support, encouragement and reassurance was provided.  Principal Problem: Alcohol use disorder, severe, dependence ([place]) Diagnosis:   Patient Active Problem List   Diagnosis Date Noted  . Major depressive disorder, recurrent episode ([place]) [F33.9] 07/16/2016  . Alcohol use disorder, severe, dependence ([place]) [F10.20] 07/16/2016  . Thoracic aortic atherosclerosis ([place]) [I70.0] 05/09/2016  . Influenza B [J10.1] 05/09/2016  . Dehydration [E86.0]   . Lactic acidosis [E87.2]   . Severe recurrent major depression without psychotic features ([place]) [F33.2] 02/21/2016  . AKI (acute kidney injury) ([place]) [N17.9] 01/20/2016  . HTN (hypertension) [I10] 09/20/2015  . HLD (hyperlipidemia) [E78.5] 09/20/2015  . Substance abuse [F19.10] 08/30/2014  . Substance induced mood disorder ([place]) [F19.94] 05/07/2014  . Cocaine abuse with cocaine-induced mood disorder ([place]) [F14.14]   . Alcohol abuse [F10.10]   . Sinus tachycardia [R00.0] 02/12/2014  . Marijuana abuse [F12.10] 06/28/2013  . Diabetes mellitus type II, uncontrolled ([place]) [E11.65] 12/09/2012   Total Time spent with patient: 30 minutes  Past Psychiatric History:   Past Medical History:  Past Medical History:  Diagnosis Date  . Cellulitis 06/28/2013  . Cocaine abuse 06/28/2013  . Cocaine abuse with cocaine-induced mood disorder ([place])   . Depression    . Diabetes mellitus   . High cholesterol   . Hypertension   . Scrotal infection 06/28/2013  . Sleep apnea   . Suicidal ideations     Past Surgical History:  Procedure Laterality Date  . TONSILLECTOMY     Family History:  Family History  Problem Relation Age of Onset  . Hypertension Mother   . Hypertension Father   . Diabetes Father    Family Psychiatric  History:  Social History:  History  Alcohol Use  . 1.8 oz/week  . 1 Glasses of wine, 1 Cans of beer, 1 Shots of liquor per week    Comment: Occasionally     History  Drug Use  . Types: Cocaine, Marijuana    Social History   Social History  . Marital status: Married    Spouse name: N/A  . Number of children: N/A  . Years of education: N/A   Social History Main Topics  . Smoking status: Never Smoker  . Smokeless tobacco: Never Used  . Alcohol use 1.8 oz/week    1 Glasses of wine, 1 Cans of beer, 1 Shots of liquor per week     Comment: Occasionally  . Drug use: Yes    Types: Cocaine, Marijuana  . Sexual activity: Yes   Other Topics Concern  . None   Social History Narrative  . None   Additional Social History:                         Sleep: Fair  Appetite:  Fair  Current Medications: Current Facility-Administered Medications  Medication Dose Route Frequency Provider Last Rate Last Dose  . acetaminophen (TYLENOL) tablet 650 mg  650 mg Oral Q6H PRN Lindell Spar I, NP      . alum & mag hydroxide-simeth (MAALOX/MYLANTA) 200-200-20 MG/5ML suspension 30 mL  30 mL Oral Q4H PRN Nwoko, Agnes I, NP      . amLODipine (NORVASC) tablet 5 mg  5 mg Oral Daily Nwoko, Agnes I, NP   5 mg at 07/17/16 4174  . hydrocortisone cream 1 %   Topical TID PRN Lindell Spar I, NP      . hydrOXYzine (ATARAX/VISTARIL) tablet 25 mg  25 mg Oral Q6H PRN Lindell Spar I, NP   25 mg at 07/16/16 2108  . insulin aspart (novoLOG) injection 0-15 Units  0-15 Units Subcutaneous TID WC Lindell Spar I, NP   11 Units at 07/17/16 1733   . insulin aspart protamine- aspart (NOVOLOG MIX 70/30) injection 10 Units  10 Units Subcutaneous BID WC Lindell Spar I, NP   10 Units at 07/17/16 1734  . magnesium hydroxide (MILK OF MAGNESIA) suspension 30 mL  30 mL Oral Daily PRN Nwoko, Agnes I, NP      . metFORMIN (GLUCOPHAGE) tablet 500 mg  500 mg Oral BID WC Nwoko, Agnes I, NP   500 mg at 07/17/16 1718  . sertraline (ZOLOFT) tablet 25 mg  25 mg Oral Daily Izediuno, Laruth Bouchard, MD   25 mg at 07/17/16 0853  . simvastatin (ZOCOR) tablet 20 mg  20 mg Oral Daily Lindell Spar I, NP   20 mg at 07/17/16 0852  . traZODone (DESYREL) tablet 50 mg  50 mg Oral QHS PRN Lindell Spar I, NP   50 mg at 07/16/16 2108    Lab Results:  Results for orders placed or performed during the hospital encounter of 07/16/16 (from the past 48 hour(s))  Glucose, capillary     Status: Abnormal   Collection Time: 07/16/16 11:24 AM  Result Value Ref Range   Glucose-Capillary 226 (H) 65 - 99 mg/dL   Comment 1 Notify RN    Comment 2 Document in Chart   Glucose, capillary     Status: Abnormal   Collection Time: 07/16/16  5:04 PM  Result Value Ref Range   Glucose-Capillary 293 (H) 65 - 99 mg/dL  Glucose, capillary     Status: Abnormal   Collection Time: 07/16/16  8:39 PM  Result Value Ref Range   Glucose-Capillary 234 (H) 65 - 99 mg/dL   Comment 1 Notify RN    Comment 2 Document in Chart   Glucose, capillary     Status: Abnormal   Collection Time: 07/17/16  6:08 AM  Result Value Ref Range   Glucose-Capillary 236 (H) 65 - 99 mg/dL  Glucose, capillary     Status: Abnormal   Collection Time: 07/17/16 11:05 AM  Result Value Ref Range   Glucose-Capillary 330 (H) 65 - 99 mg/dL  Glucose, capillary     Status: Abnormal   Collection Time: 07/17/16 11:57 AM  Result Value Ref Range   Glucose-Capillary 273 (H) 65 - 99 mg/dL  Glucose, capillary     Status: Abnormal   Collection Time: 07/17/16  5:06 PM  Result Value Ref Range   Glucose-Capillary 305 (H) 65 - 99 mg/dL    Comment 1 Notify RN    Comment 2 Document in Chart     Blood Alcohol level:  Lab Results  Component Value Date   ETH <5 07/15/2016   ETH 7 (H) 09/20/4816    Metabolic Disorder Labs: Lab Results  Component Value Date   HGBA1C  12.0 (H) 02/23/2016   MPG 298 02/23/2016   MPG 329 09/21/2015   No results found for: PROLACTIN Lab Results  Component Value Date   CHOL 265 (H) 08/31/2014   TRIG 351 (H) 08/31/2014   HDL 49 08/31/2014   CHOLHDL 5.4 08/31/2014   VLDL 70 (H) 08/31/2014   LDLCALC 146 (H) 08/31/2014   LDLCALC 163 (H) 05/07/2014    Physical Findings: AIMS: Facial and Oral Movements Muscles of Facial Expression: None, normal Lips and Perioral Area: None, normal Jaw: None, normal Tongue: None, normal,Extremity Movements Upper (arms, wrists, hands, fingers): None, normal Lower (legs, knees, ankles, toes): None, normal, Trunk Movements Neck, shoulders, hips: None, normal, Overall Severity Severity of abnormal movements (highest score from questions above): None, normal Incapacitation due to abnormal movements: None, normal Patient's awareness of abnormal movements (rate only patient's report): No Awareness, Dental Status Current problems with teeth and/or dentures?: No Does patient usually wear dentures?: No  CIWA:  CIWA-Ar Total: 0 COWS:     Musculoskeletal: Strength & Muscle Tone: within normal limits Gait & Station: normal Patient leans: N/A  Psychiatric Specialty Exam: Physical Exam  Nursing note and vitals reviewed. Constitutional: He is oriented to person, place, and time. He appears well-developed.  Cardiovascular: Normal rate.   Neurological: He is alert and oriented to person, place, and time.  Psychiatric: He has a normal mood and affect. His behavior is normal.    Review of Systems  Psychiatric/Behavioral: Positive for substance abuse. Negative for depression. The patient is nervous/anxious.     Blood pressure 113/77, pulse 100, temperature 97.8  F (36.6 C), resp. rate 16, height 5\' 7"  (1.702 m), weight 92.1 kg (203 lb), SpO2 97 %.Body mass index is 31.79 kg/m.  General Appearance: Casual  Eye Contact:  Good  Speech:  Clear and Coherent  Volume:  Normal  Mood:  Anxious and Depressed  Affect:  Congruent  Thought Process:  Coherent  Orientation:  Full (Time, Place, and Person)  Thought Content:  Hallucinations: None  Suicidal Thoughts:  No  Homicidal Thoughts:  No  Memory:  Immediate;   Fair Remote;   Fair  Judgement:  Fair  Insight:  Fair  Psychomotor Activity:  Normal  Concentration:  Concentration: Good  Recall:  [place] of Knowledge:  Fair  Language:  Good  Akathisia:  No  Handed:  Right  AIMS (if indicated):     Assets:  Communication Skills Desire for Improvement Resilience Social Support  ADL's:  Intact  Cognition:  WNL  Sleep:  Number of Hours: 6.25      I agree with current treatment plan on 07/17/2016, Patient seen face-to-face for psychiatric evaluation follow-up, chart reviewed. Reviewed the information documented and agree with the treatment plan. Treatment Plan Summary: Daily contact with patient to assess and evaluate symptoms and progress in treatment and Medication management  Continue with Zoloft 20 mg for mood stabilization. Continue with Trazodone 50 mg for insomnia Will continue to monitor vitals ,medication compliance and treatment side effects while patient is here.  CSW will start working on disposition.  Patient to participate in therapeutic milieu  [name], NP 07/17/2016, 5:45 PM

## 2016-07-17 NOTE — BHH Group Notes (Signed)
[place] LCSW Group Therapy  07/17/2016 10 AM  Type of Therapy:  Group Therapy  Participation Level:  Minimal  Participation Quality:  Attentive  Affect:  Depressed  Cognitive:  Alert  Insight:  None shared  Engagement in Therapy:  Limited  Modes of Intervention:  Discussion, Exploration, Problem-solving, Socialization and Support  Summary of Progress/Problems: Topic for today was thoughts and feelings regarding discharge. We discussed fears of upcoming changes including judgements, expectations and stigma of mental health issues. We then discussed supports: what constitutes a supportive framework, identification of supports and what to do when others are not supportive. Patients then processed the concept of self care and what that might look like. Patient did share awareness that he needs sobriety.   Sheilah Pigeon, LCSW

## 2016-07-17 NOTE — Plan of Care (Signed)
Problem: Activity: Goal: Imbalance in normal sleep/wake cycle will improve Outcome: Progressing Patient slept through out the night and has been up for the majority of the shift.

## 2016-07-17 NOTE — Progress Notes (Signed)
Patient did attend the evening speaker AA meeting.  

## 2016-07-17 NOTE — B[facility] Group Notes (Signed)
[place] [facility] Group Notes:  (Nursing/MHT/Case Management/Adjunct)  Date:  07/17/2016  Time:  4:28 PM  Type of Therapy:  Psychoeducational Skills  Participation Level:  Active  Participation Quality:  Appropriate  Affect:  Appropriate  Cognitive:  Appropriate  Insight:  Appropriate  Engagement in Group:  Engaged  Modes of Intervention:  Problem-solving  Summary of Progress/Problems: Group encouraged to surround themselves with positive and healthy group/support system when changing to a healthy life style.    Wolfgang Phoenix 07/17/2016, 4:28 PM

## 2016-07-17 NOTE — Progress Notes (Signed)
Data. Patient denies SI/HI/AVH. Patient denies any withdrawal symptoms at this time. Patient interacting well with staff and other patients. Affect is flat, but does brighten with interaction. He has been in the dayroom, playing dominoes with peers for most of the day. Action. Emotional support and encouragement offered. Education provided on medication, indications and side effect. Q 15 minute checks done for safety. Response. Safety on the unit maintained through 15 minute checks.  Medications taken as prescribed. Attended groups. Remained calm and appropriate through out shift.

## 2016-07-18 LAB — GLUCOSE, CAPILLARY
GLUCOSE-CAPILLARY: 233 mg/dL — AB (ref 65–99)
Glucose-Capillary: 276 mg/dL — ABNORMAL HIGH (ref 65–99)
Glucose-Capillary: 234 mg/dL — ABNORMAL HIGH (ref 65–99)
Glucose-Capillary: 255 mg/dL — ABNORMAL HIGH (ref 65–99)

## 2016-07-18 MED ORDER — INSULIN ASPART PROT & ASPART (70-30 MIX) 100 UNIT/ML ~~LOC~~ SUSP
14.0000 [IU] | Freq: Two times a day (BID) | SUBCUTANEOUS | Status: DC
  Administered 2016-07-18 – 2016-07-19 (×2): 14 [IU] via SUBCUTANEOUS

## 2016-07-18 NOTE — Progress Notes (Signed)
D.  Pt pleasant on approach, denies complaints at this time.  Positive for evening AA group, minimal interaction with peers on unit observed this evening.  Pt denies SI/HI/hallucinations at this time.  A.  Support and encouragement offered, medication given as ordered  R.  Pt remains safe on the unit, will continue to monitor.

## 2016-07-18 NOTE — Tx Team (Signed)
Interdisciplinary Treatment and Diagnostic Plan Update  07/18/2016 Time of Session: 0930 Jonathan Austin MRN: [id number]  Principal Diagnosis: Alcohol use disorder, severe, dependence ([place])  Secondary Diagnoses: Principal Problem:   Alcohol use disorder, severe, dependence ([place]) Active Problems:   Substance induced mood disorder ([place])   Major depressive disorder, recurrent episode ([place])   Current Medications:  Current Facility-Administered Medications  Medication Dose Route Frequency Provider Last Rate Last Dose  . acetaminophen (TYLENOL) tablet 650 mg  650 mg Oral Q6H PRN Nwoko, Agnes I, NP      . alum & mag hydroxide-simeth (MAALOX/MYLANTA) 200-200-20 MG/5ML suspension 30 mL  30 mL Oral Q4H PRN Nwoko, Agnes I, NP      . amLODipine (NORVASC) tablet 5 mg  5 mg Oral Daily Nwoko, Agnes I, NP   5 mg at 07/18/16 0827  . hydrocortisone cream 1 %   Topical TID PRN Lindell Spar I, NP      . hydrOXYzine (ATARAX/VISTARIL) tablet 25 mg  25 mg Oral Q6H PRN Lindell Spar I, NP   25 mg at 07/17/16 2104  . insulin aspart (novoLOG) injection 0-15 Units  0-15 Units Subcutaneous TID WC Lindell Spar I, NP   8 Units at 07/18/16 [phone]  . insulin aspart protamine- aspart (NOVOLOG MIX 70/30) injection 10 Units  10 Units Subcutaneous BID WC Lindell Spar I, NP   10 Units at 07/18/16 0827  . magnesium hydroxide (MILK OF MAGNESIA) suspension 30 mL  30 mL Oral Daily PRN Nwoko, Agnes I, NP      . metFORMIN (GLUCOPHAGE) tablet 500 mg  500 mg Oral BID WC Nwoko, Agnes I, NP   500 mg at 07/18/16 0827  . sertraline (ZOLOFT) tablet 25 mg  25 mg Oral Daily Izediuno, Laruth Bouchard, MD   25 mg at 07/18/16 0827  . simvastatin (ZOCOR) tablet 20 mg  20 mg Oral Daily Lindell Spar I, NP   20 mg at 07/18/16 0827  . traZODone (DESYREL) tablet 50 mg  50 mg Oral QHS PRN Lindell Spar I, NP   50 mg at 07/17/16 2104   PTA Medications: Prescriptions Prior to Admission  Medication Sig Dispense Refill Last Dose  . acetaminophen (TYLENOL) 325  MG tablet Take 2 tablets (650 mg total) by mouth every 6 (six) hours as needed for mild pain, moderate pain or fever.   unk  . amLODipine (NORVASC) 5 MG tablet Take 1 tablet (5 mg total) by mouth daily. 30 tablet 0 Past Week at Unknown time  . Hypromellose (ARTIFICIAL TEARS OP) Place 1 drop into both eyes daily as needed (dry eyes).   unk  . insulin aspart protamine- aspart (NOVOLOG MIX 70/30) (70-30) 100 UNIT/ML injection Inject 0.1 mLs (10 Units total) into the skin 2 (two) times daily with a meal. (Patient taking differently: Inject 7-8 Units into the skin See admin instructions. 7 units in am, 8 units in pm) 10 mL 0 07/14/2016  . metFORMIN (GLUCOPHAGE) 500 MG tablet Take 1 tablet (500 mg total) by mouth 2 (two) times daily with a meal. 60 tablet 0 07/14/2016  . Pseudoeph-Doxylamine-DM-APAP (NYQUIL MULTI-SYMPTOM PO) Take 1 capsule by mouth at bedtime as needed (sleep).   unk  . simvastatin (ZOCOR) 20 MG tablet Take 1 tablet (20 mg total) by mouth daily. 30 tablet 0 07/14/2016    Patient Stressors: Financial difficulties Loss of child Substance abuse  Patient Strengths: Ability for insight Capable of independent living Physical Health Religious Affiliation  Treatment Modalities: Medication Management, Group therapy,  Case management,  1 to 1 session with clinician, Psychoeducation, Recreational therapy.   Physician Treatment Plan for Primary Diagnosis: Alcohol use disorder, severe, dependence ([place]) Long Term Goal(s): Improvement in symptoms so as ready for discharge Improvement in symptoms so as ready for discharge   Short Term Goals: Ability to identify changes in lifestyle to reduce recurrence of condition will improve Ability to verbalize feelings will improve Ability to disclose and discuss suicidal ideas Ability to demonstrate self-control will improve Ability to identify and develop effective coping behaviors will improve Compliance with prescribed medications will improve Ability to  identify triggers associated with substance abuse/mental health issues will improve  Medication Management: Evaluate patient's response, side effects, and tolerance of medication regimen.  Therapeutic Interventions: 1 to 1 sessions, Unit Group sessions and Medication administration.  Evaluation of Outcomes: Progressing  Physician Treatment Plan for Secondary Diagnosis: Principal Problem:   Alcohol use disorder, severe, dependence ([place]) Active Problems:   Substance induced mood disorder ([facility])   Major depressive disorder, recurrent episode ([place])  Long Term Goal(s): Improvement in symptoms so as ready for discharge Improvement in symptoms so as ready for discharge   Short Term Goals: Ability to identify changes in lifestyle to reduce recurrence of condition will improve Ability to verbalize feelings will improve Ability to disclose and discuss suicidal ideas Ability to demonstrate self-control will improve Ability to identify and develop effective coping behaviors will improve Compliance with prescribed medications will improve Ability to identify triggers associated with substance abuse/mental health issues will improve     Medication Management: Evaluate patient's response, side effects, and tolerance of medication regimen.  Therapeutic Interventions: 1 to 1 sessions, Unit Group sessions and Medication administration.  Evaluation of Outcomes: Progressing   RN Treatment Plan for Primary Diagnosis: Alcohol use disorder, severe, dependence ([place]) Long Term Goal(s): Knowledge of disease and therapeutic regimen to maintain health will improve  Short Term Goals: Ability to remain free from injury will improve, Ability to verbalize feelings will improve and Ability to disclose and discuss suicidal ideas  Medication Management: RN will administer medications as ordered by provider, will assess and evaluate patient's response and provide education to patient for prescribed medication. RN  will report any adverse and/or side effects to prescribing provider.  Therapeutic Interventions: 1 on 1 counseling sessions, Psychoeducation, Medication administration, Evaluate responses to treatment, Monitor vital signs and CBGs as ordered, Perform/monitor CIWA, COWS, AIMS and Fall Risk screenings as ordered, Perform wound care treatments as ordered.  Evaluation of Outcomes: Progressing   LCSW Treatment Plan for Primary Diagnosis: Alcohol use disorder, severe, dependence ([place]) Long Term Goal(s): Safe transition to appropriate next level of care at discharge, Engage patient in therapeutic group addressing interpersonal concerns.  Short Term Goals: Engage patient in aftercare planning with referrals and resources, Facilitate patient progression through stages of change regarding substance use diagnoses and concerns and Identify triggers associated with mental health/substance abuse issues  Therapeutic Interventions: Assess for all discharge needs, 1 to 1 time with Social worker, Explore available resources and support systems, Assess for adequacy in community support network, Educate family and significant other(s) on suicide prevention, Complete Psychosocial Assessment, Interpersonal group therapy.  Evaluation of Outcomes: Progressing   Progress in Treatment: Attending groups: No. Participating in groups: No. Taking medication as prescribed: Yes. Toleration medication: Yes. Family/Significant other contact made: No, will contact:  pt's wife if he consents to contact. Patient understands diagnosis: Yes. Discussing patient identified problems/goals with staff: Yes. Medical problems stabilized or resolved: Yes. Denies suicidal/homicidal  ideation: Yes. Issues/concerns per patient self-inventory: No. Other: n/a   New problem(s) identified: No, Describe:  n/a  New Short Term/Long Term Goal(s): detox, medication stabilization, development of comprehensive mental wellness/sobriety plan.    Discharge Plan or Barriers: CSW assessing for appropriate referrals. [facility]--pt has been off medications for about 2 months which coincides with his relapse date.   Reason for Continuation of Hospitalization: Anxiety Depression Medication stabilization Withdrawal symptoms  Estimated Length of Stay: 3-5 days   Attendees: Patient: 07/18/2016 8:58 AM  Physician: Dr. Sanjuana Letters MD 07/18/2016 8:58 AM  Nursing: Aileen Fass RN 07/18/2016 8:58 AM  RN Care Manager: Lars Pinks CM 07/18/2016 8:58 AM  Social Worker: Maxie Better, LCSW 07/18/2016 8:58 AM  Recreational Therapist: x 07/18/2016 8:58 AM  Other: Ricky Ala NP 07/18/2016 8:58 AM  Other:  07/18/2016 8:58 AM  Other: 07/18/2016 8:58 AM    Scribe for Treatment Team: [place], LCSW 07/18/2016 8:58 AM

## 2016-07-18 NOTE — Plan of Care (Signed)
Problem: Coping: Goal: Ability to verbalize feelings will improve Outcome: Not Progressing Patient at this time is minimal and forwards little. Encouragement and support provided.

## 2016-07-18 NOTE — Progress Notes (Signed)
Inpatient Diabetes Program Recommendations  AACE/ADA: New Consensus Statement on Inpatient Glycemic Control (2015)  Target Ranges:  Prepandial:   less than 140 mg/dL      Peak postprandial:   less than 180 mg/dL (1-2 hours)      Critically ill patients:  140 - 180 mg/dL   Results for DAYMON, HORA (MRN [id number]) as of 07/18/2016 09:45  Ref. Range 07/17/2016 06:08 07/17/2016 11:05 07/17/2016 11:57 07/17/2016 17:06 07/17/2016 21:02 07/18/2016 05:49  Glucose-Capillary Latest Ref Range: 65 - 99 mg/dL 236 (H) 330 (H) 273 (H) 305 (H) 262 (H) 276 (H)   Review of Glycemic Control  Diabetes history: DM 2 Outpatient Diabetes medications: 70/30 insulin 7 units QAM, 8 units QPM Current orders for Inpatient glycemic control: 70/30 10 units BID, Metformin 500 mg BID, Novolog Moderate tid  Inpatient Diabetes Program Recommendations:    Glucose trends elevated in the 200's. Patient already taking more 70/30 than prescribed at home. Consider increasing 70/30 to 14 units BID.  Thanks,  Tama Headings RN, MSN, [facility] Inpatient Diabetes Coordinator Team Pager [phone] (8a-5p)

## 2016-07-18 NOTE — Progress Notes (Signed)
Pt attend wrap up group. His day was a 10. He trying to get things taken care of while he's here and once he leave. Pt said he's going to family service to get additional help. He need to meet with his sponsor and he need to work on himself to make she he keep a positive outlook for his life.

## 2016-07-18 NOTE — [facility] Group Notes (Signed)
PT SUMMARY   Jonathan Austin was present throughout group.  Alert and attentive as evidenced by eye contact and body language.  He listed to group conversation, but did not engage.     GROUP SUMMARY   Pt attended spiritual care group on grief and loss facilitated by chaplain Jerene Pitch   Group opened with brief discussion and psycho-social ed around grief and loss in relationships and in relation to self - identifying life patterns, circumstances, changes that cause losses. Established group norm of speaking from own life experience. Group goal of establishing open and affirming space for members to share loss and experience with grief, normalize grief experience and provide psycho social education and grief support.    [facility] / [place], [place]  Page [phone]  Office [phone]

## 2016-07-18 NOTE — Plan of Care (Signed)
Problem: Activity: Goal: Interest or engagement in activities will improve Outcome: Progressing Pt did attend evening AA group with appropriate participation

## 2016-07-18 NOTE — Progress Notes (Signed)
[facility] MD Progress Note  07/18/2016 2:20 PM Jonathan Austin  MRN:  [id number] Subjective:   51 yo AAM married, unemployed, lives with family. Background history of SUD and Mood Disorder. Presented unaccompanied for voluntary admission. Reports worsening depression associated with suicidal thoughts. Binge pattern of substance use. Has been using cocaine regularly lately. Reports being stress by financial constraints and marital issues. He has been ruminating on the loss of his step son lately. UDS was positive for cocaine.   Chart reviewed today. Patient discussed at team  Staff reports that patient has been appropriate on the unit. Patient has been interacting well with peers. No behavioral issues. Patient has not voiced any suicidal thoughts. Patient has not been observed to be internally stimulated. Patient has been adherent with treatment recommendations. Patient has been tolerating their medication well.   Seen today. Says he is feeling well. He plans to engage with the church more. Says his wife has been supportive.  States that he is in good spirits. Not feeling depressed. Reports normal energy and interest. Has been sleeping well at night. Normal appetite. He is able to think clearly. No evidence of mania. No hallucination in any modality. He is not making any delusional statement. No passivity of will/thought.  No thoughts of suicide. No thoughts of homicide. No violent thoughts. No overwhelming anxiety.   Principal Problem: Alcohol use disorder, severe, dependence ([place]) Diagnosis:   Patient Active Problem List   Diagnosis Date Noted  . Major depressive disorder, recurrent episode ([place]) [F33.9] 07/16/2016  . Alcohol use disorder, severe, dependence ([place]) [F10.20] 07/16/2016  . Thoracic aortic atherosclerosis ([place]) [I70.0] 05/09/2016  . Influenza B [J10.1] 05/09/2016  . Dehydration [E86.0]   . Lactic acidosis [E87.2]   . Severe recurrent major depression without psychotic features ([place])  [F33.2] 02/21/2016  . AKI (acute kidney injury) ([place]) [N17.9] 01/20/2016  . HTN (hypertension) [I10] 09/20/2015  . HLD (hyperlipidemia) [E78.5] 09/20/2015  . Substance abuse [F19.10] 08/30/2014  . Substance induced mood disorder ([place]) [F19.94] 05/07/2014  . Cocaine abuse with cocaine-induced mood disorder ([place]) [F14.14]   . Alcohol abuse [F10.10]   . Sinus tachycardia [R00.0] 02/12/2014  . Marijuana abuse [F12.10] 06/28/2013  . Diabetes mellitus type II, uncontrolled ([place]) [E11.65] 12/09/2012   Total Time spent with patient: 20 minutes  Past Psychiatric History: As in H&P  Past Medical History:  Past Medical History:  Diagnosis Date  . Cellulitis 06/28/2013  . Cocaine abuse 06/28/2013  . Cocaine abuse with cocaine-induced mood disorder ([place])   . Depression   . Diabetes mellitus   . High cholesterol   . Hypertension   . Scrotal infection 06/28/2013  . Sleep apnea   . Suicidal ideations     Past Surgical History:  Procedure Laterality Date  . TONSILLECTOMY     Family History:  Family History  Problem Relation Age of Onset  . Hypertension Mother   . Hypertension Father   . Diabetes Father    Family Psychiatric  History: As in H&P  Social History:  History  Alcohol Use  . 1.8 oz/week  . 1 Glasses of wine, 1 Cans of beer, 1 Shots of liquor per week    Comment: Occasionally     History  Drug Use  . Types: Cocaine, Marijuana    Social History   Social History  . Marital status: Married    Spouse name: N/A  . Number of children: N/A  . Years of education: N/A   Social History Main Topics  .  Smoking status: Never Smoker  . Smokeless tobacco: Never Used  . Alcohol use 1.8 oz/week    1 Glasses of wine, 1 Cans of beer, 1 Shots of liquor per week     Comment: Occasionally  . Drug use: Yes    Types: Cocaine, Marijuana  . Sexual activity: Yes   Other Topics Concern  . None   Social History Narrative  . None   Additional Social History:       Sleep:  Good  Appetite:  Good  Current Medications: Current Facility-Administered Medications  Medication Dose Route Frequency Provider Last Rate Last Dose  . acetaminophen (TYLENOL) tablet 650 mg  650 mg Oral Q6H PRN Nwoko, Agnes I, NP      . alum & mag hydroxide-simeth (MAALOX/MYLANTA) 200-200-20 MG/5ML suspension 30 mL  30 mL Oral Q4H PRN Nwoko, Agnes I, NP      . amLODipine (NORVASC) tablet 5 mg  5 mg Oral Daily Nwoko, Agnes I, NP   5 mg at 07/18/16 0827  . hydrocortisone cream 1 %   Topical TID PRN Lindell Spar I, NP      . hydrOXYzine (ATARAX/VISTARIL) tablet 25 mg  25 mg Oral Q6H PRN Lindell Spar I, NP   25 mg at 07/17/16 2104  . insulin aspart (novoLOG) injection 0-15 Units  0-15 Units Subcutaneous TID WC Lindell Spar I, NP   5 Units at 07/18/16 1203  . insulin aspart protamine- aspart (NOVOLOG MIX 70/30) injection 14 Units  14 Units Subcutaneous BID WC Gaddiel, Tanika N, NP      . magnesium hydroxide (MILK OF MAGNESIA) suspension 30 mL  30 mL Oral Daily PRN Nwoko, Agnes I, NP      . metFORMIN (GLUCOPHAGE) tablet 500 mg  500 mg Oral BID WC Nwoko, Agnes I, NP   500 mg at 07/18/16 0827  . sertraline (ZOLOFT) tablet 25 mg  25 mg Oral Daily Izediuno, Laruth Bouchard, MD   25 mg at 07/18/16 0827  . simvastatin (ZOCOR) tablet 20 mg  20 mg Oral Daily Lindell Spar I, NP   20 mg at 07/18/16 0827  . traZODone (DESYREL) tablet 50 mg  50 mg Oral QHS PRN Lindell Spar I, NP   50 mg at 07/17/16 2104    Lab Results:  Results for orders placed or performed during the hospital encounter of 07/16/16 (from the past 48 hour(s))  Glucose, capillary     Status: Abnormal   Collection Time: 07/16/16  5:04 PM  Result Value Ref Range   Glucose-Capillary 293 (H) 65 - 99 mg/dL  Glucose, capillary     Status: Abnormal   Collection Time: 07/16/16  8:39 PM  Result Value Ref Range   Glucose-Capillary 234 (H) 65 - 99 mg/dL   Comment 1 Notify RN    Comment 2 Document in Chart   Glucose, capillary     Status: Abnormal    Collection Time: 07/17/16  6:08 AM  Result Value Ref Range   Glucose-Capillary 236 (H) 65 - 99 mg/dL  Glucose, capillary     Status: Abnormal   Collection Time: 07/17/16 11:05 AM  Result Value Ref Range   Glucose-Capillary 330 (H) 65 - 99 mg/dL  Glucose, capillary     Status: Abnormal   Collection Time: 07/17/16 11:57 AM  Result Value Ref Range   Glucose-Capillary 273 (H) 65 - 99 mg/dL  Glucose, capillary     Status: Abnormal   Collection Time: 07/17/16  5:06 PM  Result Value Ref  Range   Glucose-Capillary 305 (H) 65 - 99 mg/dL   Comment 1 Notify RN    Comment 2 Document in Chart   Glucose, capillary     Status: Abnormal   Collection Time: 07/17/16  9:02 PM  Result Value Ref Range   Glucose-Capillary 262 (H) 65 - 99 mg/dL  Glucose, capillary     Status: Abnormal   Collection Time: 07/18/16  5:49 AM  Result Value Ref Range   Glucose-Capillary 276 (H) 65 - 99 mg/dL  Glucose, capillary     Status: Abnormal   Collection Time: 07/18/16 11:52 AM  Result Value Ref Range   Glucose-Capillary 233 (H) 65 - 99 mg/dL   Comment 1 Notify RN    Comment 2 Document in Chart     Blood Alcohol level:  Lab Results  Component Value Date   ETH <5 07/15/2016   ETH 7 (H) 32/44/0102    Metabolic Disorder Labs: Lab Results  Component Value Date   HGBA1C 12.0 (H) 02/23/2016   MPG 298 02/23/2016   MPG 329 09/21/2015   No results found for: PROLACTIN Lab Results  Component Value Date   CHOL 265 (H) 08/31/2014   TRIG 351 (H) 08/31/2014   HDL 49 08/31/2014   CHOLHDL 5.4 08/31/2014   VLDL 70 (H) 08/31/2014   LDLCALC 146 (H) 08/31/2014   LDLCALC 163 (H) 05/07/2014    Physical Findings: AIMS: Facial and Oral Movements Muscles of Facial Expression: None, normal Lips and Perioral Area: None, normal Jaw: None, normal Tongue: None, normal,Extremity Movements Upper (arms, wrists, hands, fingers): None, normal Lower (legs, knees, ankles, toes): None, normal, Trunk Movements Neck, shoulders,  hips: None, normal, Overall Severity Severity of abnormal movements (highest score from questions above): None, normal Incapacitation due to abnormal movements: None, normal Patient's awareness of abnormal movements (rate only patient's report): No Awareness, Dental Status Current problems with teeth and/or dentures?: No Does patient usually wear dentures?: No  CIWA:  CIWA-Ar Total: 1 COWS:     Musculoskeletal: Strength & Muscle Tone: within normal limits Gait & Station: normal Patient leans: N/A  Psychiatric Specialty Exam: Physical Exam  Constitutional: He is oriented to person, place, and time. He appears well-developed and well-nourished.  HENT:  Head: Normocephalic.  Eyes: Pupils are equal, round, and reactive to light.  Neck: Normal range of motion. Neck supple.  Cardiovascular: Normal rate and regular rhythm.   Respiratory: Effort normal and breath sounds normal.  GI: Soft. Bowel sounds are normal.  Musculoskeletal: Normal range of motion.  Neurological: He is alert and oriented to person, place, and time.  Skin: Skin is warm and dry.  Psychiatric:  As above    ROS  Blood pressure 117/77, pulse 96, temperature 98.5 F (36.9 C), temperature source Oral, resp. rate 16, height 5\' 7"  (1.702 m), weight 92.1 kg (203 lb), SpO2 97 %.Body mass index is 31.79 kg/m.  General Appearance: Neatly dressed. Calm and cooperative. Appropriate behavior. Not internally distracted.   Eye Contact:  Good  Speech:  Clear and Coherent and Normal Rate  Volume:  Normal  Mood:  Euthymic  Affect:  Appropriate and Full Range  Thought Process:  Linear  Orientation:  Full (Time, Place, and Person)  Thought Content:  Future oriented. No delusional theme. No preoccupation with violent thoughts. No negative ruminations. No obsession.  No hallucination in any modality.   Suicidal Thoughts:  No  Homicidal Thoughts:  No  Memory:  Immediate;   Good Recent;   Good Remote;  Good  Judgement:  Good   Insight:  Good  Psychomotor Activity:  Normal  Concentration:  Concentration: Good and Attention Span: Good  Recall:  Good  Fund of Knowledge:  Good  Language:  Good  Akathisia:  Negative  Handed:    AIMS (if indicated):     Assets:  Communication Skills Desire for Improvement Financial Resources/Insurance Housing Intimacy Physical Health Resilience Social Support Talents/Skills Transportation Vocational/Educational  ADL's:  Intact  Cognition:  WNL  Sleep:  Number of Hours: 6.25     Treatment Plan Summary: Patient is coming off psychoactive substances. No complications. No evidence of psychosis. No evidence of mania. No suicidal or homicidal thoughts. We would gather collateral from his family and evaluate him further. Hopeful discharge tomorrow if he remains stable.   Psychiatric: SUD ( cocaine, THC and alcohol) MDD recurrent  Medical: HTN HLD DM  Psychosocial:  Financial constraints  PLAN: 1. Continue current regimen 2.  SW would coordinate aftercare and obtain collateral from his wife 3. Hopeful discharge tomorrow.  [place], MD 07/18/2016, 2:20 PM

## 2016-07-18 NOTE — Progress Notes (Signed)
Recreation Therapy Notes  Date: 07/18/16 Time: 0930 Location: [address] Group Room  Group Topic: Stress Management  Goal Area(s) Addresses:  Patient will verbalize importance of using healthy stress management.  Patient will identify positive emotions associated with healthy stress management.   Behavioral Response: Engaged  Intervention: Stress Management  Activity :  Meditation.  LRT introduced the stress management technique of meditation.  LRT played a meditation from the Calm app to allow patients to participate in meditating.  Patients were to follow along with the meditation to fully engage in the technique.  Education:  Stress Management, Discharge Planning.   Education Outcome: Acknowledges edcuation/In group clarification offered/Needs additional education  Clinical Observations/Feedback: Pt attended group.   Victorino Sparrow, LRT/CTRS         Victorino Sparrow A 07/18/2016 12:29 PM

## 2016-07-18 NOTE — Progress Notes (Signed)
Patient ID: Jonathan Austin, male   DOB: [birth year], 51 y.o.   MRN: [id number]  DAR: Pt. Denies SI/HI and A/V Hallucinations. He reports sleep is good, appetite is good, energy level is normal, and concentration is good. He rates depression, hopelessness, and anxiety 0/10. Patient does not report any pain or discomfort at this time. He denies withdrawal symptoms at this time. Support and encouragement provided to the patient. Scheduled medications administered to patient per physician's orders. Patient is minimal with Probation officer but cooperative. His affect is flat and mood is depressed at this time. Q15 minute checks are maintained for safety.

## 2016-07-18 NOTE — Plan of Care (Signed)
Problem: Safety: Goal: Periods of time without injury will increase Outcome: Progressing Patient is on q15 minute safety checks and contracts for safety on the unit.   

## 2016-07-18 NOTE — Progress Notes (Signed)
Nursing Progress Note [id number]  D) Patient presents calm and cooperative on the unit. Patient reports he is "hoping to discharge tomorrow". Patient reports he "is feeling better". Patient is seen interactive in the milieu. Patient denies SI/HI/AVH or pain. Patient contracts for safety on the unit. Patient reports sleeping well with current regimen.  A) Emotional support given. 1:1 interaction and active listening provided. Patient medicated as prescribed. Medications and plan of care reviewed with patient. Patient verbalized understanding without further questions.  Snacks and fluids provided. Opportunities for questions or concerns presented to patient. Patient encouraged to continue to work on treatment goals. Labs, vital signs, CBG, and patient behavior monitored throughout shift. Patient safety maintained with q15 min safety checks. /Low fall risk precautions in place and reviewed with patient; patient verbalized understanding.  R) Patient receptive to interaction with nurse. Patient remains safe on the unit at this time. Patient denies any adverse medication reactions at this time. Patient is resting in bed without complaints. Will continue to monitor.

## 2016-07-18 NOTE — BHH Group Notes (Signed)
[place] LCSW Group Therapy  07/18/2016 4:02 PM  Type of Therapy:  Group Therapy  Participation Level:  Active  Participation Quality:  Attentive  Affect:  Appropriate  Cognitive:  Alert and Oriented  Insight:  Improving  Engagement in Therapy:  Improving  Modes of Intervention:  Discussion, Education, Socialization and Support  Summary of Progress/Problems: Today's Topic: Overcoming Obstacles. Patients identified one short term goal and potential obstacles in reaching this goal. Patients processed barriers involved in overcoming these obstacles. Patients identified steps necessary for overcoming these obstacles and explored motivation (internal and external) for facing these difficulties head on. Jonathan Austin was attentive and engaged during today's processing group. He shared that his goal is to discharge on Tuesday and he hopes to begin treatment outpatient at [facility]. Jonathan Austin appears hard of hearing and struggled to listen as others shared in the group setting but remained pleasant and calm throughout.   Jonathan Austin N Smart LCSW 07/18/2016, 4:02 PM

## 2016-07-19 DIAGNOSIS — F332 Major depressive disorder, recurrent severe without psychotic features: Principal | ICD-10-CM

## 2016-07-19 DIAGNOSIS — F1420 Cocaine dependence, uncomplicated: Secondary | ICD-10-CM

## 2016-07-19 LAB — GLUCOSE, CAPILLARY
GLUCOSE-CAPILLARY: 186 mg/dL — AB (ref 65–99)
Glucose-Capillary: 266 mg/dL — ABNORMAL HIGH (ref 65–99)

## 2016-07-19 MED ORDER — METFORMIN HCL 500 MG PO TABS: 500.0000 mg | ORAL_TABLET | Freq: Two times a day (BID) | ORAL | 0 refills | Status: DC

## 2016-07-19 MED ORDER — SIMVASTATIN 20 MG PO TABS: 20.0000 mg | ORAL_TABLET | Freq: Every day | ORAL | 0 refills | Status: DC

## 2016-07-19 MED ORDER — TRAZODONE HCL 50 MG PO TABS: 50.0000 mg | ORAL_TABLET | Freq: Every evening | ORAL | 0 refills | Status: DC | PRN

## 2016-07-19 MED ORDER — INSULIN ASPART PROT & ASPART (70-30 MIX) 100 UNIT/ML ~~LOC~~ SUSP: 14.0000 [IU] | Freq: Two times a day (BID) | SUBCUTANEOUS | 11 refills | Status: DC

## 2016-07-19 MED ORDER — HYDROXYZINE HCL 25 MG PO TABS: 25.0000 mg | ORAL_TABLET | Freq: Four times a day (QID) | ORAL | 0 refills | Status: DC | PRN

## 2016-07-19 MED ORDER — AMLODIPINE BESYLATE 5 MG PO TABS: 5.0000 mg | ORAL_TABLET | Freq: Every day | ORAL | 0 refills | Status: DC

## 2016-07-19 MED ORDER — HYDROCORTISONE 1 % EX CREA: TOPICAL_CREAM | Freq: Three times a day (TID) | CUTANEOUS | 0 refills | Status: DC | PRN

## 2016-07-19 MED ORDER — ACETAMINOPHEN 325 MG PO TABS: 650.0000 mg | ORAL_TABLET | Freq: Four times a day (QID) | ORAL | Status: DC | PRN

## 2016-07-19 MED ORDER — SERTRALINE HCL 25 MG PO TABS: 25.0000 mg | ORAL_TABLET | Freq: Every day | ORAL | 0 refills | Status: DC

## 2016-07-19 NOTE — BHH Suicide Risk Assessment (Signed)
[facility] Discharge Suicide Risk Assessment   Principal Problem: Major depressive disorder, recurrent episode [facility]) Discharge Diagnoses:  Patient Active Problem List   Diagnosis Date Noted  . Cocaine use disorder, severe, dependence ([place]) [F14.20] 07/19/2016  . Major depressive disorder, recurrent episode ([place]) [F33.9] 07/16/2016  . Alcohol use disorder, severe, dependence ([place]) [F10.20] 07/16/2016  . Thoracic aortic atherosclerosis ([place]) [I70.0] 05/09/2016  . Influenza B [J10.1] 05/09/2016  . Dehydration [E86.0]   . Lactic acidosis [E87.2]   . Severe recurrent major depression without psychotic features ([place]) [F33.2] 02/21/2016  . AKI (acute kidney injury) ([place]) [N17.9] 01/20/2016  . HTN (hypertension) [I10] 09/20/2015  . HLD (hyperlipidemia) [E78.5] 09/20/2015  . Substance abuse [F19.10] 08/30/2014  . Substance induced mood disorder ([place]) [F19.94] 05/07/2014  . Cocaine abuse with cocaine-induced mood disorder ([place]) [F14.14]   . Alcohol abuse [F10.10]   . Sinus tachycardia [R00.0] 02/12/2014  . Marijuana abuse [F12.10] 06/28/2013  . Diabetes mellitus type II, uncontrolled ([place]) [E11.65] 12/09/2012    Total Time spent with patient: 30 minutes  Musculoskeletal: Strength & Muscle Tone: within normal limits Gait & Station: normal Patient leans: N/A  Psychiatric Specialty Exam: Review of Systems  Psychiatric/Behavioral: Positive for substance abuse. Negative for depression and suicidal ideas.  All other systems reviewed and are negative.   Blood pressure 123/84, pulse 87, temperature 98.3 F (36.8 C), temperature source Oral, resp. rate 18, height 5\' 7"  (1.702 m), weight 92.1 kg (203 lb), SpO2 97 %.Body mass index is 31.79 kg/m.  General Appearance: Casual  Eye Contact::  Fair  Speech:  Clear and Coherent409  Volume:  Normal  Mood:  Euthymic  Affect:  Appropriate  Thought Process:  Goal Directed and Descriptions of Associations: Intact  Orientation:  Full (Time, Place, and  Person)  Thought Content:  Logical  Suicidal Thoughts:  No  Homicidal Thoughts:  No  Memory:  Immediate;   Fair Recent;   Fair Remote;   Fair  Judgement:  Fair  Insight:  Fair  Psychomotor Activity:  Normal  Concentration:  Fair  Recall:  [employer] of Knowledge:Fair  Language: Fair  Akathisia:  No  Handed:  Right  AIMS (if indicated):     Assets:  Communication Skills Desire for Improvement  Sleep:  Number of Hours: 6.75  Cognition: WNL  ADL's:  Intact   Mental Status Per Nursing Assessment::   On Admission:     Demographic Factors:  Male  Loss Factors: NA  Historical Factors: Impulsivity  Risk Reduction Factors:   Positive social support and Positive therapeutic relationship  Continued Clinical Symptoms:  Alcohol/Substance Abuse/Dependencies Previous Psychiatric Diagnoses and Treatments  Cognitive Features That Contribute To Risk:  None    Suicide Risk:  Minimal: No identifiable suicidal ideation.  Patients presenting with no risk factors but with morbid ruminations; may be classified as minimal risk based on the severity of the depressive symptoms  Follow-up Information    [place] Follow up.   Specialty:  Professional Counselor Why:  Walk in between 8am-12pm within 7 days of hospital discharge.  Contact information: [address] [place] [address] [phone]           Plan Of Care/Follow-up recommendations:  Activity:  no restrictions Diet:  regular Tests:  as needed Other:  follow up with aftercare  Kala Ambriz, MD 07/19/2016, 10:08 AM

## 2016-07-19 NOTE — Tx Team (Signed)
Interdisciplinary Treatment and Diagnostic Plan Update  07/19/2016 Time of Session: 0930 Jonathan Austin MRN: [id number]  Principal Diagnosis: Major depressive disorder, recurrent episode ([place])  Secondary Diagnoses: Principal Problem:   Major depressive disorder, recurrent episode ([place]) Active Problems:   Substance induced mood disorder (HCC)   Alcohol use disorder, severe, dependence ([place])   Cocaine use disorder, severe, dependence ([place])   Current Medications:  Current Facility-Administered Medications  Medication Dose Route Frequency Provider Last Rate Last Dose  . acetaminophen (TYLENOL) tablet 650 mg  650 mg Oral Q6H PRN Nwoko, Agnes I, NP      . alum & mag hydroxide-simeth (MAALOX/MYLANTA) 200-200-20 MG/5ML suspension 30 mL  30 mL Oral Q4H PRN Nwoko, Agnes I, NP      . amLODipine (NORVASC) tablet 5 mg  5 mg Oral Daily Nwoko, Agnes I, NP   5 mg at 07/19/16 0825  . hydrocortisone cream 1 %   Topical TID PRN Lindell Spar I, NP      . hydrOXYzine (ATARAX/VISTARIL) tablet 25 mg  25 mg Oral Q6H PRN Lindell Spar I, NP   25 mg at 07/18/16 2132  . insulin aspart (novoLOG) injection 0-15 Units  0-15 Units Subcutaneous TID WC Lindell Spar I, NP   3 Units at 07/19/16 [phone]  . insulin aspart protamine- aspart (NOVOLOG MIX 70/30) injection 14 Units  14 Units Subcutaneous BID WC [name], NP   14 Units at 07/19/16 0828  . magnesium hydroxide (MILK OF MAGNESIA) suspension 30 mL  30 mL Oral Daily PRN Nwoko, Agnes I, NP      . metFORMIN (GLUCOPHAGE) tablet 500 mg  500 mg Oral BID WC Nwoko, Agnes I, NP   500 mg at 07/19/16 0826  . sertraline (ZOLOFT) tablet 25 mg  25 mg Oral Daily Izediuno, Laruth Bouchard, MD   25 mg at 07/19/16 0825  . simvastatin (ZOCOR) tablet 20 mg  20 mg Oral Daily Lindell Spar I, NP   20 mg at 07/19/16 0825  . traZODone (DESYREL) tablet 50 mg  50 mg Oral QHS PRN Lindell Spar I, NP   50 mg at 07/18/16 2132   PTA Medications: Prescriptions Prior to Admission  Medication Sig  Dispense Refill Last Dose  . amLODipine (NORVASC) 5 MG tablet Take 1 tablet (5 mg total) by mouth daily. 30 tablet 0 Past Week at Unknown time  . Hypromellose (ARTIFICIAL TEARS OP) Place 1 drop into both eyes daily as needed (dry eyes).   unk  . insulin aspart protamine- aspart (NOVOLOG MIX 70/30) (70-30) 100 UNIT/ML injection Inject 0.1 mLs (10 Units total) into the skin 2 (two) times daily with a meal. (Patient taking differently: Inject 7-8 Units into the skin See admin instructions. 7 units in am, 8 units in pm) 10 mL 0 07/14/2016  . metFORMIN (GLUCOPHAGE) 500 MG tablet Take 1 tablet (500 mg total) by mouth 2 (two) times daily with a meal. 60 tablet 0 07/14/2016  . Pseudoeph-Doxylamine-DM-APAP (NYQUIL MULTI-SYMPTOM PO) Take 1 capsule by mouth at bedtime as needed (sleep).   unk  . simvastatin (ZOCOR) 20 MG tablet Take 1 tablet (20 mg total) by mouth daily. 30 tablet 0 07/14/2016  . [DISCONTINUED] acetaminophen (TYLENOL) 325 MG tablet Take 2 tablets (650 mg total) by mouth every 6 (six) hours as needed for mild pain, moderate pain or fever.   unk    Patient Stressors: Financial difficulties Loss of child Substance abuse  Patient Strengths: Ability for insight Capable of independent living Physical Health  Religious Affiliation  Treatment Modalities: Medication Management, Group therapy, Case management,  1 to 1 session with clinician, Psychoeducation, Recreational therapy.   Physician Treatment Plan for Primary Diagnosis: Major depressive disorder, recurrent episode ([place]) Long Term Goal(s): Improvement in symptoms so as ready for discharge Improvement in symptoms so as ready for discharge   Short Term Goals: Ability to identify changes in lifestyle to reduce recurrence of condition will improve Ability to verbalize feelings will improve Ability to disclose and discuss suicidal ideas Ability to demonstrate self-control will improve Ability to identify and develop effective coping behaviors  will improve Compliance with prescribed medications will improve Ability to identify triggers associated with substance abuse/mental health issues will improve  Medication Management: Evaluate patient's response, side effects, and tolerance of medication regimen.  Therapeutic Interventions: 1 to 1 sessions, Unit Group sessions and Medication administration.  Evaluation of Outcomes: Met  Physician Treatment Plan for Secondary Diagnosis: Principal Problem:   Major depressive disorder, recurrent episode ([place]) Active Problems:   Substance induced mood disorder (HCC)   Alcohol use disorder, severe, dependence (HCC)   Cocaine use disorder, severe, dependence ([place])  Long Term Goal(s): Improvement in symptoms so as ready for discharge Improvement in symptoms so as ready for discharge   Short Term Goals: Ability to identify changes in lifestyle to reduce recurrence of condition will improve Ability to verbalize feelings will improve Ability to disclose and discuss suicidal ideas Ability to demonstrate self-control will improve Ability to identify and develop effective coping behaviors will improve Compliance with prescribed medications will improve Ability to identify triggers associated with substance abuse/mental health issues will improve     Medication Management: Evaluate patient's response, side effects, and tolerance of medication regimen.  Therapeutic Interventions: 1 to 1 sessions, Unit Group sessions and Medication administration.  Evaluation of Outcomes: Met   RN Treatment Plan for Primary Diagnosis: Major depressive disorder, recurrent episode ([place]) Long Term Goal(s): Knowledge of disease and therapeutic regimen to maintain health will improve  Short Term Goals: Ability to remain free from injury will improve, Ability to verbalize feelings will improve and Ability to disclose and discuss suicidal ideas  Medication Management: RN will administer medications as ordered by  provider, will assess and evaluate patient's response and provide education to patient for prescribed medication. RN will report any adverse and/or side effects to prescribing provider.  Therapeutic Interventions: 1 on 1 counseling sessions, Psychoeducation, Medication administration, Evaluate responses to treatment, Monitor vital signs and CBGs as ordered, Perform/monitor CIWA, COWS, AIMS and Fall Risk screenings as ordered, Perform wound care treatments as ordered.  Evaluation of Outcomes: Met   LCSW Treatment Plan for Primary Diagnosis: Major depressive disorder, recurrent episode ([place]) Long Term Goal(s): Safe transition to appropriate next level of care at discharge, Engage patient in therapeutic group addressing interpersonal concerns.  Short Term Goals: Engage patient in aftercare planning with referrals and resources, Facilitate patient progression through stages of change regarding substance use diagnoses and concerns and Identify triggers associated with mental health/substance abuse issues  Therapeutic Interventions: Assess for all discharge needs, 1 to 1 time with Social worker, Explore available resources and support systems, Assess for adequacy in community support network, Educate family and significant other(s) on suicide prevention, Complete Psychosocial Assessment, Interpersonal group therapy.  Evaluation of Outcomes: Met   Progress in Treatment: Attending groups: Yes Participating in groups:Yes Taking medication as prescribed: Yes. Toleration medication: Yes. Family/Significant other contact made: SPE completed with pt's wife.  Patient understands diagnosis: Yes. Discussing patient identified problems/goals  with staff: Yes. Medical problems stabilized or resolved: Yes. Denies suicidal/homicidal ideation: Yes. Issues/concerns per patient self-inventory: No. Other: n/a   New problem(s) identified: No, Describe:  n/a  New Short Term/Long Term Goal(s): detox, medication  stabilization, development of comprehensive mental wellness/sobriety plan.   Discharge Plan or Barriers: Pt plans to return home; follow-up at [facility] for med management (if there is a wait-list at [facility]) and [facility] [place] for counseling/group therapy. Pt also provided with NA/AA list for [facility] and [facility] pamphlet.   Reason for Continuation of Hospitalization: none  Estimated Length of Stay: discharge today   Attendees: Patient: 07/19/2016 10:43 AM  Physician: Dr. Shea Evans MD 07/19/2016 10:43 AM  Nursing: Samul Dada RN 07/19/2016 10:43 AM  RN Care Manager: Lars Pinks CM 07/19/2016 10:43 AM  Social Worker: Maxie Better, LCSW 07/19/2016 10:43 AM  Recreational Therapist: x 07/19/2016 10:43 AM  Other: Lindell Spar NP 07/19/2016 10:43 AM  Other:  07/19/2016 10:43 AM  Other: 07/19/2016 10:43 AM    Scribe for Treatment Team: [place], LCSW 07/19/2016 10:43 AM

## 2016-07-19 NOTE — BHH Suicide Risk Assessment (Signed)
[place] INPATIENT:  Family/Significant Other Suicide Prevention Education  Suicide Prevention Education:  Education Completed; Jonathan Austin (pt's wife) [phone] has been identified by the patient as the family member/significant other with whom the patient will be residing, and identified as the person(s) who will aid the patient in the event of a mental health crisis (suicidal ideations/suicide attempt).  With written consent from the patient, the family member/significant other has been provided the following suicide prevention education, prior to the and/or following the discharge of the patient.  The suicide prevention education provided includes the following:  Suicide risk factors  Suicide prevention and interventions  National Suicide Hotline telephone number  [facility] assessment telephone number  [facility] Emergency Assistance [place] and/or Residential Mobile Crisis Unit telephone number  Request made of family/significant other to:  Remove weapons (e.g., guns, rifles, knives), all items previously/currently identified as safety concern.    Remove drugs/medications (over-the-counter, prescriptions, illicit drugs), all items previously/currently identified as a safety concern.  The family member/significant other verbalizes understanding of the suicide prevention education information provided.  The family member/significant other agrees to remove the items of safety concern listed above.  Cady Hafen N Smart LCSW 07/19/2016, 10:41 AM

## 2016-07-19 NOTE — BHH Group Notes (Signed)
[place] Group Notes:  (Nursing/MHT/Case Management/Adjunct)  Date:  07/19/2016  Time:  0900  Type of Therapy:  Nurse Education - Successful Goal Setting  Participation Level:  Active  Participation Quality:  Appropriate  Affect:  Appropriate  Cognitive:  Appropriate  Insight:  Good  Engagement in Group:  Engaged  Modes of Intervention:  Discussion and Education  Summary of Progress/Problems: Patient attended and participated appropriately in goal setting discussion.    Loletta Specter [facility] 07/19/2016, 0930

## 2016-07-19 NOTE — Discharge Summary (Signed)
Physician Discharge Summary Note  Patient:  Jonathan Austin is an 51 y.o., male MRN:  [id number] DOB:  18-Mar-1965 Patient phone:  [phone] (home)  Patient address:   [address] [place] [address],  Total Time spent with patient: Greater than 30 minutes  Date of Admission:  07/16/2016 Date of Discharge: 07-19-16  Reason for Admission: Worsening depression associated with suicidal thoughts.  Principal Problem: Major depressive disorder, recurrent episode [facility])  Discharge Diagnoses: Patient Active Problem List   Diagnosis Date Noted  . Alcohol use disorder, severe, dependence ([place]) [F10.20] 07/16/2016    Priority: High  . Cocaine abuse with cocaine-induced mood disorder ([place]) [F14.14]     Priority: Medium  . Cocaine use disorder, severe, dependence ([place]) [F14.20] 07/19/2016  . Major depressive disorder, recurrent episode ([place]) [F33.9] 07/16/2016  . Thoracic aortic atherosclerosis ([place]) [I70.0] 05/09/2016  . Influenza B [J10.1] 05/09/2016  . Dehydration [E86.0]   . Lactic acidosis [E87.2]   . Severe recurrent major depression without psychotic features ([place]) [F33.2] 02/21/2016  . AKI (acute kidney injury) ([place]) [N17.9] 01/20/2016  . HTN (hypertension) [I10] 09/20/2015  . HLD (hyperlipidemia) [E78.5] 09/20/2015  . Substance abuse [F19.10] 08/30/2014  . Substance induced mood disorder ([place]) [F19.94] 05/07/2014  . Alcohol abuse [F10.10]   . Sinus tachycardia [R00.0] 02/12/2014  . Marijuana abuse [F12.10] 06/28/2013  . Diabetes mellitus type II, uncontrolled ([place]) [E11.65] 12/09/2012   Past Psychiatric History: Cocaine & alcohol use disorder, Substance induced mood disorder.  Past Medical History:  Past Medical History:  Diagnosis Date  . Cellulitis 06/28/2013  . Cocaine abuse 06/28/2013  . Cocaine abuse with cocaine-induced mood disorder ([place])   . Depression   . Diabetes mellitus   . High cholesterol   . Hypertension   . Scrotal infection 06/28/2013  . Sleep  apnea   . Suicidal ideations     Past Surgical History:  Procedure Laterality Date  . TONSILLECTOMY     Family History:  Family History  Problem Relation Age of Onset  . Hypertension Mother   . Hypertension Father   . Diabetes Father    Family Psychiatric  History: See H&P  Social History:  History  Alcohol Use  . 1.8 oz/week  . 1 Glasses of wine, 1 Cans of beer, 1 Shots of liquor per week    Comment: Occasionally     History  Drug Use  . Types: Cocaine, Marijuana    Social History   Social History  . Marital status: Married    Spouse name: N/A  . Number of children: N/A  . Years of education: N/A   Social History Main Topics  . Smoking status: Never Smoker  . Smokeless tobacco: Never Used  . Alcohol use 1.8 oz/week    1 Glasses of wine, 1 Cans of beer, 1 Shots of liquor per week     Comment: Occasionally  . Drug use: Yes    Types: Cocaine, Marijuana  . Sexual activity: Yes   Other Topics Concern  . None   Social History Narrative  . None   Hospital Course: (Per admission notes): Jonathan Austin is a  51 yo AAM married, unemployed, lives with family. Background history of SUD and Mood Disorder. Presented unaccompanied for voluntary admission. Reports worsening depression associated with suicidal thoughts. Binge pattern of substance use. Has been using cocaine regularly lately. Reports being stress by financial constraints and marital issues. He has been ruminating on the loss of his step son lately. UDS was positive for cocaine.  At interview reports long struggle with addiction. Has been on a downward slope lately. Decided to come in and get help. Multiple family deaths over the years. Last one was three years ago. While under the influence, he has been ruminating a lot about negative things in the past. Overwhelmed with is current financial situation. Wants to detox and stay sober. Not focused on inpatient rehab at this time. Suicidal thoughts has waned since he has been  here. No perceptual abnormality. No delusional preoccupation. No feeling of impending doom. No thoughts of violence. No thoughts of harming others. Reports good support from his wife and his church. Very active in his church. Off and on AA attendance. No weapons at home, no past suicidal behavior. Has grown children and grand children. His medical issues has been stable.  After the above admission assessment, Jonathan Austin was admitted for mood stabilization treatments. He was evaluated for the worsening symptoms & the medication regimen initiated. He received & was discharged on; Hydroxyzine 25 mg prn for anxiety, Sertraline 25 mg for depression & Trazodone 50 mg for insomnia. He also was resumed on all his pertinent home medications for his other pre-existing medical issues presented. He tolerated his treatment regimen without any adverse effects or reactions reported. Jonathan Austin was enrolled & participated in the group counseling sessions being offered & held on this unit. She learned coping skills.   Jonathan Austin is seen today by his attending psychiatrist. He stated that he is pleased that he sought help. Says he is tolerating his medications well. No withdrawal symptoms. No craving for substances. He is no longer feeling depressed. Describes normal energy and ability to think. Able to focus on task. No suicidal thoughts. No homicidal thoughts. No thoughts of violence. Patient reports normal biological functions.   The nursing staff reports that patient has been appropriate on the unit. Patient has been interacting well with peers & staff. No behavioral issues. Patient has not voiced any suicidal thoughts. Patient has not been observed to be internally stimulated or pre-occupied. Patient has been adherent to treatment recommendations. Patient has been tolerating his medication well, denies any adverse reactions or side effects.   Patient was discussed at the team meeting this morning. Team members feel that patient is  back to his baseline level of function. Team agrees with plan to discharge patient today to continue mental health care on outpatient basis as noted below. Upon discharge, Jahmeek adamantly denies any SIHI, AVH, delusional thoughts, paranoia or substance withdrawal symptoms. He will continue further psychiatric follow-up care/medication management on an outpatient basis as noted below. He was provided with all the necessary information needed to make this appointment without problems including a 7 days worth, supply samples of his [facility] discharge medications. He left [facility] with all personal belongings in no apparent distress. Transportation per wife.  Physical Findings: AIMS: Facial and Oral Movements Muscles of Facial Expression: None, normal Lips and Perioral Area: None, normal Jaw: None, normal Tongue: None, normal,Extremity Movements Upper (arms, wrists, hands, fingers): None, normal Lower (legs, knees, ankles, toes): None, normal, Trunk Movements Neck, shoulders, hips: None, normal, Overall Severity Severity of abnormal movements (highest score from questions above): None, normal Incapacitation due to abnormal movements: None, normal Patient's awareness of abnormal movements (rate only patient's report): No Awareness, Dental Status Current problems with teeth and/or dentures?: No Does patient usually wear dentures?: No  CIWA:  CIWA-Ar Total: 1 COWS:     Musculoskeletal: Strength & Muscle Tone: within normal limits Gait & Station: normal  Patient leans: N/A  Psychiatric Specialty Exam: Physical Exam  Constitutional: He is oriented to person, place, and time. He appears well-developed.  HENT:  Head: Normocephalic.  Eyes: Pupils are equal, round, and reactive to light.  Neck: Normal range of motion.  Cardiovascular: Normal rate.   Respiratory: Effort normal.  GI: Soft.  Genitourinary:  Genitourinary Comments: Deferred  Musculoskeletal: Normal range of motion.  Neurological: He is  alert and oriented to person, place, and time.  Skin: Skin is warm and dry.    Review of Systems  Constitutional: Negative.   HENT: Negative.   Eyes: Negative.   Respiratory: Negative.   Cardiovascular: Negative.   Gastrointestinal: Negative.   Genitourinary: Negative.   Musculoskeletal: Negative.   Skin: Negative.   Neurological: Negative.   Endo/Heme/Allergies: Negative.   Psychiatric/Behavioral: Positive for depression (Stable) and substance abuse (Hx. cocaine). Negative for hallucinations, memory loss and suicidal ideas. The patient has insomnia (Stable). The patient is not nervous/anxious.     Blood pressure 123/84, pulse 87, temperature 98.3 F (36.8 C), temperature source Oral, resp. rate 18, height 5\' 7"  (1.702 m), weight 92.1 kg (203 lb), SpO2 97 %.Body mass index is 31.79 kg/m.  See Md's SRA   Have you used any form of tobacco in the last 30 days? (Cigarettes, Smokeless Tobacco, Cigars, and/or Pipes): No  Has this patient used any form of tobacco in the last 30 days? (Cigarettes, Smokeless Tobacco, Cigars, and/or Pipes): No  Blood Alcohol level:  Lab Results  Component Value Date   ETH <5 07/15/2016   ETH 7 (H) 62/83/6629   Metabolic Disorder Labs:  Lab Results  Component Value Date   HGBA1C 12.0 (H) 02/23/2016   MPG 298 02/23/2016   MPG 329 09/21/2015   No results found for: PROLACTIN Lab Results  Component Value Date   CHOL 265 (H) 08/31/2014   TRIG 351 (H) 08/31/2014   HDL 49 08/31/2014   CHOLHDL 5.4 08/31/2014   VLDL 70 (H) 08/31/2014   LDLCALC 146 (H) 08/31/2014   LDLCALC 163 (H) 05/07/2014   See Psychiatric Specialty Exam and Suicide Risk Assessment completed by Attending Physician prior to discharge.  Discharge destination:  Home  Is patient on multiple antipsychotic therapies at discharge:  No   Has Patient had three or more failed trials of antipsychotic monotherapy by history:  No  Recommended Plan for Multiple Antipsychotic  Therapies: NA  Allergies as of 07/19/2016   No Known Allergies     Medication List    STOP taking these medications   ARTIFICIAL TEARS OP   NYQUIL MULTI-SYMPTOM PO     TAKE these medications     Indication  acetaminophen 325 MG tablet Commonly known as:  TYLENOL Take 2 tablets (650 mg total) by mouth every 6 (six) hours as needed for mild pain, moderate pain or fever.  Indication:  Pain   amLODipine 5 MG tablet Commonly known as:  NORVASC Take 1 tablet (5 mg total) by mouth daily. For high blood pressure What changed:  additional instructions  Indication:  High Blood Pressure Disorder   hydrocortisone cream 1 % Apply topically 3 (three) times daily as needed for itching (apply to itching areas.).  Indication:  Itching   hydrOXYzine 25 MG tablet Commonly known as:  ATARAX/VISTARIL Take 1 tablet (25 mg total) by mouth every 6 (six) hours as needed for anxiety.  Indication:  Anxiety Neurosis   insulin aspart protamine- aspart (70-30) 100 UNIT/ML injection Commonly known as:  NOVOLOG MIX  70/30 Inject 0.14 mLs (14 Units total) into the skin 2 (two) times daily with a meal. For diabetes management What changed:  how much to take  additional instructions  Indication:  Type 2 Diabetes   metFORMIN 500 MG tablet Commonly known as:  GLUCOPHAGE Take 1 tablet (500 mg total) by mouth 2 (two) times daily with a meal. For diabetes management What changed:  additional instructions  Indication:  Type 2 Diabetes   sertraline 25 MG tablet Commonly known as:  ZOLOFT Take 1 tablet (25 mg total) by mouth daily. For depression  Indication:  Major Depressive Disorder   simvastatin 20 MG tablet Commonly known as:  ZOCOR Take 1 tablet (20 mg total) by mouth daily. For high cholesterol What changed:  additional instructions  Indication:  High Cholesterol   traZODone 50 MG tablet Commonly known as:  DESYREL Take 1 tablet (50 mg total) by mouth at bedtime as needed for sleep.   Indication:  [facility] Follow up.   Specialty:  Professional Counselor Why:  Walk in between 8am-12pm within 7 days of hospital discharge to be assessed for mental health services including therapy/counseling and medication management. Please go to [facility] for medication management if [facility] has a waitlist. Thank you.  Contact information: [place] [id number] [phone]        [facility] Follow up.   Specialty:  Behavioral Health Why:  Walk in within 7 days of hospital discharge to be assessed for medication management services if needed. Walk in hours: Mon-Fri 8am-9am. Thank you.  Contact information: [place] [address] [phone]          Follow-up recommendations: Activity:  As tolerated Diet: As recommended by your primary care doctor. Keep all scheduled follow-up appointments as recommended.   Comments: Patient is instructed prior to discharge to: Take all medications as prescribed by his/her mental healthcare provider. Report any adverse effects and or reactions from the medicines to his/her outpatient provider promptly. Patient has been instructed & cautioned: To not engage in alcohol and or illegal drug use while on prescription medicines. In the event of worsening symptoms, patient is instructed to call the crisis hotline, 911 and or go to the nearest ED for appropriate evaluation and treatment of symptoms. To follow-up with his/her primary care provider for your other medical issues, concerns and or health care needs.   Signed: Encarnacion Slates, NP, PMHNP, FNP-BC 07/20/2016, 12:02 PM

## 2016-07-19 NOTE — Progress Notes (Signed)
  [facility] Adult Case Management Discharge Plan :  Will you be returning to the same living situation after discharge:  Yes,  home with wife At discharge, do you have transportation home?: Yes,  wife Do you have the ability to pay for your medications: Yes,  mental health  Release of information consent forms completed and submitted to medical records by CSW.  Patient to Follow up at: Follow-up Information    [place] Follow up.   Specialty:  Professional Counselor Why:  Walk in between 8am-12pm within 7 days of hospital discharge to be assessed for mental health services including therapy/counseling and medication management. Please [facility] to [facility] for medication management if [facility] has a waitlist. Thank you.  Contact information: [place] [address] [phone]        [facility] Follow up.   Specialty:  Behavioral Health Why:  Walk in within 7 days of hospital discharge to be assessed for medication management services if needed. Walk in hours: Mon-Fri 8am-9am. Thank you.  Contact information: [place] [address] [phone]           Next level of care provider has access to [facility] and Suicide Prevention discussed: Yes,  SPE completed with pt's wife. SPI pamphlet and Mobile Crisis information provided to pt.   Have you used any form of tobacco in the last 30 days? (Cigarettes, Smokeless Tobacco, Cigars, and/or Pipes): No  Has patient been referred to the Quitline?: N/A patient is not a smoker  Patient has been referred for addiction treatment: Yes  Kimber Relic Smart LCSW 07/19/2016, 10:42 AM

## 2016-07-19 NOTE — Progress Notes (Signed)
Jonathan Austin had a bright affect and pleasant demeanor this a.m when he denied SI, HI, and AVH. He verbalized readiness for discharge and was interacting well with peers and staff.  Patient was discharged per order just prior to noon. AVS, medications, scripts, transition summary, and suicide safety plan were all reviewed with patient. Pt was given an opportunity to ask questions and verbalized understanding of all discharge paperwork. Belongings were returned, and patient signed for receipt. Patient verbalized readiness for discharge and appeared in no acute distress when escorted to lobby.

## 2016-08-30 ENCOUNTER — Encounter (HOSPITAL_COMMUNITY): Payer: Self-pay | Admitting: Nurse Practitioner

## 2016-08-30 ENCOUNTER — Emergency Department (HOSPITAL_COMMUNITY)
Admission: EM | Admit: 2016-08-30 | Discharge: 2016-09-02 | Disposition: A | Discharge status: Home / Self Care | Payer: No Typology Code available for payment source | Attending: Physician Assistant | Admitting: Physician Assistant

## 2016-08-30 DIAGNOSIS — R45851 Suicidal ideations: Secondary | ICD-10-CM

## 2016-08-30 DIAGNOSIS — I10 Essential (primary) hypertension: Secondary | ICD-10-CM | POA: Insufficient documentation

## 2016-08-30 DIAGNOSIS — F332 Major depressive disorder, recurrent severe without psychotic features: Secondary | ICD-10-CM | POA: Diagnosis present

## 2016-08-30 DIAGNOSIS — F32A Depression, unspecified: Secondary | ICD-10-CM

## 2016-08-30 DIAGNOSIS — F329 Major depressive disorder, single episode, unspecified: Secondary | ICD-10-CM | POA: Insufficient documentation

## 2016-08-30 DIAGNOSIS — Z79899 Other long term (current) drug therapy: Secondary | ICD-10-CM | POA: Insufficient documentation

## 2016-08-30 DIAGNOSIS — E119 Type 2 diabetes mellitus without complications: Secondary | ICD-10-CM | POA: Insufficient documentation

## 2016-08-30 DIAGNOSIS — F1414 Cocaine abuse with cocaine-induced mood disorder: Secondary | ICD-10-CM | POA: Insufficient documentation

## 2016-08-30 DIAGNOSIS — Z794 Long term (current) use of insulin: Secondary | ICD-10-CM | POA: Insufficient documentation

## 2016-08-30 LAB — RAPID URINE DRUG SCREEN, HOSP PERFORMED
Amphetamines: NOT DETECTED
BARBITURATES: NOT DETECTED
BENZODIAZEPINES: NOT DETECTED
Cocaine: POSITIVE — AB
Opiates: NOT DETECTED
TETRAHYDROCANNABINOL: NOT DETECTED

## 2016-08-30 LAB — COMPREHENSIVE METABOLIC PANEL
ALBUMIN: 3.9 g/dL (ref 3.5–5.0)
ALT: 27 U/L (ref 17–63)
ANION GAP: 9 (ref 5–15)
AST: 29 U/L (ref 15–41)
Alkaline Phosphatase: 53 U/L (ref 38–126)
BUN: 13 mg/dL (ref 6–20)
CO2: 25 mmol/L (ref 22–32)
Calcium: 9.4 mg/dL (ref 8.9–10.3)
Chloride: 102 mmol/L (ref 101–111)
Creatinine, Ser: 1.21 mg/dL (ref 0.61–1.24)
GFR calc Af Amer: >60 mL/min (ref >60)
GFR calc non Af Amer: >60 mL/min (ref >60)
GLUCOSE: 149 mg/dL — AB (ref 65–99)
POTASSIUM: 3.4 mmol/L — AB (ref 3.5–5.1)
SODIUM: 136 mmol/L (ref 135–145)
Total Bilirubin: 0.5 mg/dL (ref 0.3–1.2)
Total Protein: 6.9 g/dL (ref 6.5–8.1)

## 2016-08-30 LAB — ETHANOL: Alcohol, Ethyl (B): <5 mg/dL (ref <5)

## 2016-08-30 LAB — CBC
HCT: 43.5 % (ref 39.0–52.0)
HEMOGLOBIN: 14.8 g/dL (ref 13.0–17.0)
MCH: 29.7 pg (ref 26.0–34.0)
MCHC: 34 g/dL (ref 30.0–36.0)
MCV: 87.2 fL (ref 78.0–100.0)
Platelets: 323 10*3/uL (ref 150–400)
RBC: 4.99 MIL/uL (ref 4.22–5.81)
RDW: 13.2 % (ref 11.5–15.5)
WBC: 7.7 10*3/uL (ref 4.0–10.5)

## 2016-08-30 LAB — ACETAMINOPHEN LEVEL: Acetaminophen (Tylenol), Serum: <10 ug/mL — ABNORMAL LOW (ref 10–30)

## 2016-08-30 LAB — SALICYLATE LEVEL: Salicylate Lvl: <7 mg/dL (ref 2.8–30.0)

## 2016-08-30 MED ORDER — IBUPROFEN 400 MG PO TABS
600.0000 mg | ORAL_TABLET | Freq: Three times a day (TID) | ORAL | Status: DC | PRN
  Administered 2016-08-30: 600 mg via ORAL
  Filled 2016-08-30: qty 1

## 2016-08-30 MED ORDER — ONDANSETRON HCL 4 MG PO TABS: 4.0000 mg | ORAL_TABLET | Freq: Three times a day (TID) | ORAL | Status: DC | PRN

## 2016-08-30 NOTE — ED Provider Notes (Signed)
[facility] DEPT Provider Note   CSN: [id number] Arrival date & time: 08/30/16  2050     History   Chief Complaint Chief Complaint  Patient presents with  . Suicidal    HPI Pearse Shiffler is a 51 y.o. male.  HPI   Patient's 51 year old presents male presenting with suicidal ideation. Patient reports that he has history of depression and anxiety. He reports he ran out of his medications a week ago. He said he's been having increased stressors in his life his plan would be to slit his wrist.  Past Medical History:  Diagnosis Date  . Cellulitis 06/28/2013  . Cocaine abuse 06/28/2013  . Cocaine abuse with cocaine-induced mood disorder ([place])   . Depression   . Diabetes mellitus   . High cholesterol   . Hypertension   . Scrotal infection 06/28/2013  . Sleep apnea   . Suicidal ideations     Patient Active Problem List   Diagnosis Date Noted  . Cocaine use disorder, severe, dependence ([place]) 07/19/2016  . Major depressive disorder, recurrent episode ([place]) 07/16/2016  . Alcohol use disorder, severe, dependence ([place]) 07/16/2016  . Thoracic aortic atherosclerosis ([place]) 05/09/2016  . Influenza B 05/09/2016  . Dehydration   . Lactic acidosis   . Severe recurrent major depression without psychotic features ([place]) 02/21/2016  . AKI (acute kidney injury) ([place]) 01/20/2016  . HTN (hypertension) 09/20/2015  . HLD (hyperlipidemia) 09/20/2015  . Substance abuse 08/30/2014  . Substance induced mood disorder ([place]) 05/07/2014  . Cocaine abuse with cocaine-induced mood disorder ([place])   . Alcohol abuse   . Sinus tachycardia 02/12/2014  . Marijuana abuse 06/28/2013  . Diabetes mellitus type II, uncontrolled ([place]) 12/09/2012    Past Surgical History:  Procedure Laterality Date  . TONSILLECTOMY         Home Medications    Prior to Admission medications   Medication Sig Start Date End Date Taking? Authorizing Provider  sertraline (ZOLOFT) 25 MG tablet Take 1 tablet (25 mg total)  by mouth daily. For depression 07/20/16  Yes Lindell Spar I, NP  acetaminophen (TYLENOL) 325 MG tablet Take 2 tablets (650 mg total) by mouth every 6 (six) hours as needed for mild pain, moderate pain or fever. 07/19/16   Lindell Spar I, NP  amLODipine (NORVASC) 5 MG tablet Take 1 tablet (5 mg total) by mouth daily. For high blood pressure 07/20/16   Lindell Spar I, NP  hydrocortisone cream 1 % Apply topically 3 (three) times daily as needed for itching (apply to itching areas.). 07/19/16   Lindell Spar I, NP  hydrOXYzine (ATARAX/VISTARIL) 25 MG tablet Take 1 tablet (25 mg total) by mouth every 6 (six) hours as needed for anxiety. 07/19/16   Lindell Spar I, NP  insulin aspart protamine- aspart (NOVOLOG MIX 70/30) (70-30) 100 UNIT/ML injection Inject 0.14 mLs (14 Units total) into the skin 2 (two) times daily with a meal. For diabetes management 07/19/16   Lindell Spar I, NP  metFORMIN (GLUCOPHAGE) 500 MG tablet Take 1 tablet (500 mg total) by mouth 2 (two) times daily with a meal. For diabetes management 07/19/16   Lindell Spar I, NP  simvastatin (ZOCOR) 20 MG tablet Take 1 tablet (20 mg total) by mouth daily. For high cholesterol 07/20/16   Lindell Spar I, NP  traZODone (DESYREL) 50 MG tablet Take 1 tablet (50 mg total) by mouth at bedtime as needed for sleep. 07/19/16   Encarnacion Slates, NP    Family History Family History  Problem Relation Age of Onset  . Hypertension Mother   . Hypertension Father   . Diabetes Father     Social History Social History  Substance Use Topics  . Smoking status: Never Smoker  . Smokeless tobacco: Never Used  . Alcohol use 3.6 oz/week    1 Glasses of wine, 1 Shots of liquor, 4 Cans of beer per week     Allergies   Patient has no known allergies.   Review of Systems Review of Systems  Constitutional: Negative for activity change.  Respiratory: Negative for shortness of breath.   Cardiovascular: Negative for chest pain.  Gastrointestinal: Negative for  abdominal pain.  Psychiatric/Behavioral: Positive for dysphoric mood. The patient is nervous/anxious.      Physical Exam Updated Vital Signs BP (!) 145/99 (BP Location: Left Arm)   Pulse (!) 119   Temp 98.6 F (37 C) (Oral)   Ht 5\' 7"  (1.702 m)   Wt 104.3 kg (230 lb)   SpO2 95%   BMI 36.02 kg/m   Physical Exam  Constitutional: He is oriented to person, place, and time. He appears well-nourished.  HENT:  Head: Normocephalic.  Eyes: Conjunctivae are normal.  Cardiovascular: Normal rate, regular rhythm and normal heart sounds.   Pulmonary/Chest: Effort normal and breath sounds normal. No respiratory distress. He has no wheezes.  Neurological: He is oriented to person, place, and time.  Skin: Skin is warm and dry. He is not diaphoretic.  Psychiatric: His behavior is normal.  Active SI     ED Treatments / Results  Labs (all labs ordered are listed, but only abnormal results are displayed) Labs Reviewed  COMPREHENSIVE METABOLIC PANEL - Abnormal; Notable for the following:       Result Value   Potassium 3.4 (*)    Glucose, Bld 149 (*)    All other components within normal limits  ACETAMINOPHEN LEVEL - Abnormal; Notable for the following:    Acetaminophen (Tylenol), Serum <10 (*)    All other components within normal limits  RAPID URINE DRUG SCREEN, HOSP PERFORMED - Abnormal; Notable for the following:    Cocaine POSITIVE (*)    All other components within normal limits  ETHANOL  SALICYLATE LEVEL  CBC    EKG  EKG Interpretation None       Radiology No results found.  Procedures Procedures (including critical care time)  Medications Ordered in ED Medications - No data to display   Initial Impression / Assessment and Plan / ED Course  I have reviewed the triage vital signs and the nursing notes.  Pertinent labs & imaging results that were available during my care of the patient were reviewed by me and considered in my medical decision making (see chart for  details).     This is a pleasant 51 year old male with history of depression and anxiety presenting today with active SI. Plan to slit his wrist. Patient's been off his medications for one week. We'll consult psych.  Final Clinical Impressions(s) / ED Diagnoses   Final diagnoses:  None    New Prescriptions New Prescriptions   No medications on file     Macarthur Critchley, MD 08/30/16 2304

## 2016-08-30 NOTE — ED Triage Notes (Signed)
Pt sts today around 11am today he started having suicidal thoughts due to increased stressors this week and using drugs again. Pt admits to smoking2 grams of cocaine, half a joint and ingesting 2 40 oz beers around 11am.  States his water was turned off and he got stressed so he turned to drugs then was upset with self for doing so. Patient states in the past he had a suicde attempt and slit his wrists- denies access to weapon or plan presently. Patient states has been off of his zoloft for 1-2 weeks due to inability to pay for it.

## 2016-08-30 NOTE — ED Notes (Signed)
Pt belongings in locker #2

## 2016-08-30 NOTE — ED Notes (Signed)
Staffing called for sitter.   

## 2016-08-31 DIAGNOSIS — F1290 Cannabis use, unspecified, uncomplicated: Secondary | ICD-10-CM | POA: Diagnosis not present

## 2016-08-31 DIAGNOSIS — G4700 Insomnia, unspecified: Secondary | ICD-10-CM

## 2016-08-31 DIAGNOSIS — F1490 Cocaine use, unspecified, uncomplicated: Secondary | ICD-10-CM | POA: Diagnosis not present

## 2016-08-31 DIAGNOSIS — F419 Anxiety disorder, unspecified: Secondary | ICD-10-CM | POA: Diagnosis not present

## 2016-08-31 DIAGNOSIS — R45851 Suicidal ideations: Secondary | ICD-10-CM

## 2016-08-31 DIAGNOSIS — F1910 Other psychoactive substance abuse, uncomplicated: Secondary | ICD-10-CM | POA: Diagnosis not present

## 2016-08-31 DIAGNOSIS — F332 Major depressive disorder, recurrent severe without psychotic features: Secondary | ICD-10-CM

## 2016-08-31 LAB — CBG MONITORING, ED: Glucose-Capillary: 219 mg/dL — ABNORMAL HIGH (ref 65–99)

## 2016-08-31 MED ORDER — INSULIN ASPART PROT & ASPART (70-30 MIX) 100 UNIT/ML ~~LOC~~ SUSP
12.0000 [IU] | Freq: Every day | SUBCUTANEOUS | Status: DC
  Administered 2016-08-31 – 2016-09-01 (×2): 12 [IU] via SUBCUTANEOUS
  Filled 2016-08-31: qty 10

## 2016-08-31 MED ORDER — INSULIN ASPART PROT & ASPART (70-30 MIX) 100 UNIT/ML ~~LOC~~ SUSP: 7.0000 [IU] | Freq: Two times a day (BID) | SUBCUTANEOUS | Status: DC

## 2016-08-31 MED ORDER — INSULIN ASPART PROT & ASPART (70-30 MIX) 100 UNIT/ML ~~LOC~~ SUSP
7.0000 [IU] | Freq: Every day | SUBCUTANEOUS | Status: DC
  Administered 2016-09-01 – 2016-09-02 (×2): 7 [IU] via SUBCUTANEOUS
  Filled 2016-08-31: qty 10

## 2016-08-31 MED ORDER — SIMVASTATIN 20 MG PO TABS
20.0000 mg | ORAL_TABLET | Freq: Every day | ORAL | Status: DC
Start: 2016-08-31 — End: 2016-09-02
  Administered 2016-08-31 – 2016-09-01 (×2): 20 mg via ORAL
  Filled 2016-08-31 (×2): qty 1

## 2016-08-31 MED ORDER — ASPIRIN EC 81 MG PO TBEC
81.0000 mg | DELAYED_RELEASE_TABLET | Freq: Every day | ORAL | Status: DC
  Administered 2016-09-01: 81 mg via ORAL
  Filled 2016-08-31: qty 1

## 2016-08-31 MED ORDER — METFORMIN HCL 500 MG PO TABS
500.0000 mg | ORAL_TABLET | Freq: Two times a day (BID) | ORAL | Status: DC
  Administered 2016-09-01 – 2016-09-02 (×3): 500 mg via ORAL
  Filled 2016-08-31 (×3): qty 1

## 2016-08-31 MED ORDER — AMLODIPINE BESYLATE 5 MG PO TABS
5.0000 mg | ORAL_TABLET | Freq: Every day | ORAL | Status: DC
  Administered 2016-09-01: 5 mg via ORAL
  Filled 2016-08-31: qty 1

## 2016-08-31 NOTE — ED Notes (Addendum)
CPAP ( sleep apnea)  applied by RT per pt.'s request .

## 2016-08-31 NOTE — ED Notes (Signed)
Patient was given a cup of water. 
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
## 2016-08-31 NOTE — ED Notes (Signed)
TTS set up at the bedside, waiting for call from behavioral health provider.

## 2016-08-31 NOTE — ED Notes (Signed)
TTS says Pt meets in patient criteria.

## 2016-08-31 NOTE — ED Notes (Signed)
Blood sugar  He is listed as a diabetic in his chart bur he reports that his blood sugars have not been checked since hes been here and he has not had insulin either.  Dinner tray here

## 2016-08-31 NOTE — ED Notes (Signed)
A Regular Diet has been ordered for Lunch.

## 2016-08-31 NOTE — ED Notes (Signed)
Report Chris RN

## 2016-08-31 NOTE — ED Notes (Signed)
To br   He attempted to make a call

## 2016-08-31 NOTE — ED Notes (Signed)
Pt. requested CPAP machine at bedtime - EDP and RT notified.

## 2016-08-31 NOTE — ED Notes (Signed)
Patient was given a cup of water and pack of Cookies, a Regular Diet was taken for Lunch.

## 2016-08-31 NOTE — Progress Notes (Signed)
Per Jinny Blossom, NP patient does not meet criteria for inpatient treatment. Recommended for discharge and to follow up with outpatient providers.   Patient recommended to go to  [facility] for medication management and to have his prescriptions filled.   Patient reports having an upcoming appointment in August with therapist at [facility].   Jonathan Spar, RN notified.    Radonna Ricker MSW, [place] Disposition [phone]

## 2016-08-31 NOTE — Consult Note (Signed)
Telepsych Consultation   Reason for Consult:  Suicidal Ideation Referring Physician:  EDP Patient Identification: Jonathan Austin MRN:  [id number] Principal Diagnosis: Severe recurrent major depression without psychotic features [facility]) Diagnosis:   Patient Active Problem List   Diagnosis Date Noted  . Severe recurrent major depression without psychotic features ([place]) [F33.2] 02/21/2016    Priority: High  . Cocaine use disorder, severe, dependence ([place]) [F14.20] 07/19/2016  . Major depressive disorder, recurrent episode ([place]) [F33.9] 07/16/2016  . Alcohol use disorder, severe, dependence ([place]) [F10.20] 07/16/2016  . Thoracic aortic atherosclerosis ([place]) [I70.0] 05/09/2016  . Influenza B [J10.1] 05/09/2016  . Dehydration [E86.0]   . Lactic acidosis [E87.2]   . AKI (acute kidney injury) ([place]) [N17.9] 01/20/2016  . HTN (hypertension) [I10] 09/20/2015  . HLD (hyperlipidemia) [E78.5] 09/20/2015  . Substance abuse [F19.10] 08/30/2014  . Substance induced mood disorder ([place]) [F19.94] 05/07/2014  . Cocaine abuse with cocaine-induced mood disorder ([place]) [F14.14]   . Alcohol abuse [F10.10]   . Sinus tachycardia [R00.0] 02/12/2014  . Marijuana abuse [F12.10] 06/28/2013  . Diabetes mellitus type II, uncontrolled ([place]) [E11.65] 12/09/2012    Total Time spent with patient: 30 minutes  Subjective:   Jonathan Austin is a 51 y.o. male patient admitted with worsening depression and suicidal ideation.  HPI:  Per tele assessment note on chart written by Alycia Patten, [facility] Counselor: Jonathan Austin is an 51 y.o. male reporting to [name] due to [place].  Pt does have intent; however, does not have a plan.  Jonathan Austin has a history of SI and expressed he had two previous gestures.  He denies HI and does not endorse AVH.  Jonathan Austin sts his current stressors or unemployment and lack of finances.  He does admit to using cocaine and alcohol. He endorses symptoms of depression. He has a history of inpatient treatment;  however, has never utilized outpatient resources.  Jonathan Austin is married and resides with his spouse.  He sts he can return upon discharge.  He has life stressors that contribute to his current state.  Jonathan Austin states he is on probation; however, does not have any pending charges or upcoming court dates.    Jonathan Austin presented in scrubs with an unremarkable appearance.  His eye contact was good.  His speech was coherent and clear.  He had freedom of movement.  His mood was depressed and irritable.  He thought process was coherent.  His judgement is impaired and his insight fair.  He was oriented to people, place, time, and situation.  Today during tele psych consult: Pt was seen and chart reviewed. Pt endorses passive suicidal ideation with no plan.  Pt denies homicidal ideation, denies auditory/visual hallucinations and does not appear to be responding to internal stimuli. Pt was calm and cooperative, alert & oriented x 4, dressed in paper scrubs and appropriate for the situation. Pt stated he is out of his Zoloft, Vistaril and trazodone due to missing two appointments at [facility]. Pt admits to using 1 gram of cocaine daily, Pt's UDS positive for cocaine, BAL negative. Pt was discharged from [facility] on 07-19-2016 after a three day stay. Pt was instructed to follow up at [facility] for medication management. Pt lives with his wife and is unemployed. Pt has an appointment at [facility] in August, could not remember the date.   Discussed case with treatment team who recommend that Pt be discharged home. Pt is psychiatrically cleared for discharge. Pt will need to return  to [facility] for therapy and [facility] for medication management if [facility] has no appointments available.    Past Psychiatric History: MDD, Cocaine use disorder, Alcohol use disorder,   Risk to Self: Suicidal Ideation: Yes-Currently Present Suicidal Intent: Yes-Currently Present Is patient at risk for suicide?:  Yes Suicidal Plan?: No-Not Currently/Within Last 6 Months Specify Current Suicidal Plan: None reported Access to Means: No Specify Access to Suicidal Means: Pt denies access to knives or guns What has been your use of drugs/alcohol within the last 12 months?: Cocaine and THC How many times?: 2 Other Self Harm Risks: None reported Triggers for Past Attempts: Other (Comment) (depressed, son passed and stop son killed) Intentional Self Injurious Behavior: None Risk to Others: Homicidal Ideation: No Thoughts of Harm to Others: No Current Homicidal Intent: No Current Homicidal Plan: No Access to Homicidal Means: No Identified Victim: N/a History of harm to others?: No Assessment of Violence: None Noted Violent Behavior Description: None reported Does patient have access to weapons?: No Criminal Charges Pending?: No Does patient have a court date: No Prior Inpatient Therapy: Prior Inpatient Therapy: Yes Prior Therapy Dates: 02/2016, multiple admits Prior Therapy Facilty/Provider(s): [facility] Reason for Treatment: MDD, substance use Prior Outpatient Therapy: Prior Outpatient Therapy: No Prior Therapy Dates: NA Prior Therapy Facilty/Provider(s): NA Reason for Treatment: NA Does patient have an ACCT team?: No Does patient have Intensive In-House Services?  : No Does patient have [facility] services? : No Does patient have P4CC services?: No  Past Medical History:  Past Medical History:  Diagnosis Date  . Cellulitis 06/28/2013  . Cocaine abuse 06/28/2013  . Cocaine abuse with cocaine-induced mood disorder ([place])   . Depression   . Diabetes mellitus   . High cholesterol   . Hypertension   . Scrotal infection 06/28/2013  . Sleep apnea   . Suicidal ideations     Past Surgical History:  Procedure Laterality Date  . TONSILLECTOMY     Family History:  Family History  Problem Relation Age of Onset  . Hypertension Mother   . Hypertension Father   . Diabetes Father    Family  Psychiatric  History: Unknown Social History:  History  Alcohol Use  . 3.6 oz/week  . 1 Glasses of wine, 1 Shots of liquor, 4 Cans of beer per week     History  Drug Use  . Types: Cocaine, Marijuana    Social History   Social History  . Marital status: Married    Spouse name: N/A  . Number of children: N/A  . Years of education: N/A   Social History Main Topics  . Smoking status: Never Smoker  . Smokeless tobacco: Never Used  . Alcohol use 3.6 oz/week    1 Glasses of wine, 1 Shots of liquor, 4 Cans of beer per week  . Drug use: Yes    Types: Cocaine, Marijuana  . Sexual activity: Yes   Other Topics Concern  . None   Social History Narrative  . None   Additional Social History:    Allergies:  No Known Allergies  Labs:  Results for orders placed or performed during the hospital encounter of 08/30/16 (from the past 48 hour(s))  Comprehensive metabolic panel     Status: Abnormal   Collection Time: 08/30/16  9:34 PM  Result Value Ref Range   Sodium 136 135 - 145 mmol/L   Potassium 3.4 (L) 3.5 - 5.1 mmol/L   Chloride 102 101 - 111 mmol/L   CO2 25 22 -  32 mmol/L   Glucose, Bld 149 (H) 65 - 99 mg/dL   BUN 13 6 - 20 mg/dL   Creatinine, Ser 1.21 0.61 - 1.24 mg/dL   Calcium 9.4 8.9 - 10.3 mg/dL   Total Protein 6.9 6.5 - 8.1 g/dL   Albumin 3.9 3.5 - 5.0 g/dL   AST 29 15 - 41 U/L   ALT 27 17 - 63 U/L   Alkaline Phosphatase 53 38 - 126 U/L   Total Bilirubin 0.5 0.3 - 1.2 mg/dL   GFR calc non Af Amer >60 >60 mL/min   GFR calc Af Amer >60 >60 mL/min    Comment: (NOTE) The eGFR has been calculated using the CKD EPI equation. This calculation has not been validated in all clinical situations. eGFR's persistently <60 mL/min signify possible Chronic Kidney Disease.    Anion gap 9 5 - 15  Ethanol     Status: None   Collection Time: 08/30/16  9:34 PM  Result Value Ref Range   Alcohol, Ethyl (B) <5 <5 mg/dL    Comment:        LOWEST DETECTABLE LIMIT FOR SERUM ALCOHOL  IS 5 mg/dL FOR MEDICAL PURPOSES ONLY   Salicylate level     Status: None   Collection Time: 08/30/16  9:34 PM  Result Value Ref Range   Salicylate Lvl <2.6 2.8 - 30.0 mg/dL  Acetaminophen level     Status: Abnormal   Collection Time: 08/30/16  9:34 PM  Result Value Ref Range   Acetaminophen (Tylenol), Serum <10 (L) 10 - 30 ug/mL    Comment:        THERAPEUTIC CONCENTRATIONS VARY SIGNIFICANTLY. A RANGE OF 10-30 ug/mL MAY BE AN EFFECTIVE CONCENTRATION FOR MANY PATIENTS. HOWEVER, SOME ARE BEST TREATED AT CONCENTRATIONS OUTSIDE THIS RANGE. ACETAMINOPHEN CONCENTRATIONS >150 ug/mL AT 4 HOURS AFTER INGESTION AND >50 ug/mL AT 12 HOURS AFTER INGESTION ARE OFTEN ASSOCIATED WITH TOXIC REACTIONS.   cbc     Status: None   Collection Time: 08/30/16  9:34 PM  Result Value Ref Range   WBC 7.7 4.0 - 10.5 K/uL   RBC 4.99 4.22 - 5.81 MIL/uL   Hemoglobin 14.8 13.0 - 17.0 g/dL   HCT 43.5 39.0 - 52.0 %   MCV 87.2 78.0 - 100.0 fL   MCH 29.7 26.0 - 34.0 pg   MCHC 34.0 30.0 - 36.0 g/dL   RDW 13.2 11.5 - 15.5 %   Platelets 323 150 - 400 K/uL  Rapid urine drug screen (hospital performed)     Status: Abnormal   Collection Time: 08/30/16  9:36 PM  Result Value Ref Range   Opiates NONE DETECTED NONE DETECTED   Cocaine POSITIVE (A) NONE DETECTED   Benzodiazepines NONE DETECTED NONE DETECTED   Amphetamines NONE DETECTED NONE DETECTED   Tetrahydrocannabinol NONE DETECTED NONE DETECTED   Barbiturates NONE DETECTED NONE DETECTED    Comment:        DRUG SCREEN FOR MEDICAL PURPOSES ONLY.  IF CONFIRMATION IS NEEDED FOR ANY PURPOSE, NOTIFY LAB WITHIN 5 DAYS.        LOWEST DETECTABLE LIMITS FOR URINE DRUG SCREEN Drug Class       Cutoff (ng/mL) Amphetamine      1000 Barbiturate      200 Benzodiazepine   948 Tricyclics       546 Opiates          300 Cocaine          300 THC  50     Current Facility-Administered Medications  Medication Dose Route Frequency Provider Last Rate Last  Dose  . ibuprofen (ADVIL,MOTRIN) tablet 600 mg  600 mg Oral Q8H PRN Mackuen, Courteney Lyn, MD   600 mg at 08/30/16 2317  . ondansetron (ZOFRAN) tablet 4 mg  4 mg Oral Q8H PRN Mackuen, Courteney Lyn, MD       Current Outpatient Prescriptions  Medication Sig Dispense Refill  . acetaminophen (TYLENOL) 325 MG tablet Take 2 tablets (650 mg total) by mouth every 6 (six) hours as needed for mild pain, moderate pain or fever.    Marland Kitchen amLODipine (NORVASC) 5 MG tablet Take 1 tablet (5 mg total) by mouth daily. For high blood pressure 30 tablet 0  . aspirin EC 81 MG tablet Take 81 mg by mouth daily.    . cholecalciferol (VITAMIN D) 1000 units tablet Take 1,000 Units by mouth daily.    . hydrocortisone cream 1 % Apply topically 3 (three) times daily as needed for itching (apply to itching areas.). 30 g 0  . hydrOXYzine (ATARAX/VISTARIL) 25 MG tablet Take 1 tablet (25 mg total) by mouth every 6 (six) hours as needed for anxiety. 60 tablet 0  . insulin aspart protamine- aspart (NOVOLOG MIX 70/30) (70-30) 100 UNIT/ML injection Inject 0.14 mLs (14 Units total) into the skin 2 (two) times daily with a meal. For diabetes management (Patient taking differently: Inject 7-12 Units into the skin 2 (two) times daily with a meal. 7 units in the morning and 12 units in the evening.) 10 mL 11  . metFORMIN (GLUCOPHAGE) 500 MG tablet Take 1 tablet (500 mg total) by mouth 2 (two) times daily with a meal. For diabetes management 60 tablet 0  . sertraline (ZOLOFT) 25 MG tablet Take 1 tablet (25 mg total) by mouth daily. For depression 30 tablet 0  . simvastatin (ZOCOR) 20 MG tablet Take 1 tablet (20 mg total) by mouth daily. For high cholesterol 30 tablet 0  . traZODone (DESYREL) 50 MG tablet Take 1 tablet (50 mg total) by mouth at bedtime as needed for sleep. 30 tablet 0    Musculoskeletal: Unable to assess: camera  Psychiatric Specialty Exam: Physical Exam  Review of Systems  Psychiatric/Behavioral: Positive for  depression, substance abuse and suicidal ideas (passive, chronic, no plan). Negative for hallucinations and memory loss. The patient is not nervous/anxious and does not have insomnia.   All other systems reviewed and are negative.   Blood pressure (!) 138/94, pulse 83, temperature 97.7 F (36.5 C), temperature source Oral, resp. rate 16, height 5' 7" (1.702 m), weight 104.3 kg (230 lb), SpO2 98 %.Body mass index is 36.02 kg/m.  General Appearance: Casual  Eye Contact:  Good  Speech:  Clear and Coherent and Normal Rate  Volume:  Normal  Mood:  Depressed  Affect:  Congruent, Depressed and Flat  Thought Process:  Coherent, Goal Directed and Linear  Orientation:  Full (Time, Place, and Person)  Thought Content:  Logical  Suicidal Thoughts:  Yes.  without intent/plan  Homicidal Thoughts:  No  Memory:  Immediate;   Good Recent;   Good Remote;   Fair  Judgement:  Fair  Insight:  Lacking  Psychomotor Activity:  Normal  Concentration:  Concentration: Good and Attention Span: Good  Recall:  [place] of Knowledge:  Good  Language:  Good  Akathisia:  No  Handed:  Right  AIMS (if indicated):     Assets:  Agricultural consultant  Housing Intimacy Resilience Social Support  ADL's:  Intact  Cognition:  WNL  Sleep:        Treatment Plan Summary: Discharge Home Follow up with [facility]or [facility] for medication management and therapy Take all medications as prescribed Avoid the use of alcohol and illicit drugs Follow up with PCP for any new or existing medical issues.   Pt will need a prescription for the following medications, 14 day supply to hold him until he can get to after care services:  Zoloft 50 mg PO QD for depression Vistaril 25 mg PO Q6hr PRN anxiety Trazodone 50 mg PO QHS for sleep  Disposition: No evidence of imminent risk to self or others at present.   Patient does not meet criteria for psychiatric  inpatient admission. Supportive therapy provided about ongoing stressors. Discussed crisis plan, support from social network, calling 911, coming to the Emergency Department, and calling Suicide Hotline.  Ethelene Hal, NP 08/31/2016 10:33 AM

## 2016-08-31 NOTE — ED Notes (Signed)
Patient wife called to speak with patient , But Patient was sleeping.

## 2016-08-31 NOTE — ED Notes (Signed)
Dr. Thomasene Lot ( EDP ) notified on pt.'s hyperglycemia .

## 2016-09-01 LAB — CBG MONITORING, ED
GLUCOSE-CAPILLARY: 166 mg/dL — AB (ref 65–99)
Glucose-Capillary: 191 mg/dL — ABNORMAL HIGH (ref 65–99)
Glucose-Capillary: 239 mg/dL — ABNORMAL HIGH (ref 65–99)

## 2016-09-01 NOTE — ED Notes (Signed)
Got lunch

## 2016-09-01 NOTE — ED Notes (Signed)
Verified insulin with Doree Albee

## 2016-09-01 NOTE — ED Notes (Signed)
Waiting for his meal to give the pt his insulin

## 2016-09-01 NOTE — ED Notes (Signed)
Pt was given his dinner tray.

## 2016-09-01 NOTE — ED Notes (Signed)
Carb Mortified Diet ordered for Dinner. 

## 2016-09-01 NOTE — ED Notes (Signed)
Regular Diet was ordered for Lunch, and Pt. Was given a sprite zero and saltine crackers and peanut butter.

## 2016-09-01 NOTE — ED Notes (Signed)
Up to shower

## 2016-09-01 NOTE — ED Notes (Signed)
Pt up wants to shower, notified sec it needed to be cleaned, pt up to make phone call

## 2016-09-02 LAB — CBG MONITORING, ED: Glucose-Capillary: 120 mg/dL — ABNORMAL HIGH (ref 65–99)

## 2016-09-02 MED ORDER — SIMVASTATIN 20 MG PO TABS: 20.0000 mg | ORAL_TABLET | Freq: Every day | ORAL | 0 refills | Status: DC

## 2016-09-02 MED ORDER — SERTRALINE HCL 25 MG PO TABS: 25.0000 mg | ORAL_TABLET | Freq: Every day | ORAL | 0 refills | Status: DC

## 2016-09-02 MED ORDER — AMLODIPINE BESYLATE 5 MG PO TABS: 5.0000 mg | ORAL_TABLET | Freq: Every day | ORAL | 0 refills | Status: DC

## 2016-09-02 MED ORDER — METFORMIN HCL 500 MG PO TABS: 500.0000 mg | ORAL_TABLET | Freq: Two times a day (BID) | ORAL | 0 refills | Status: DC

## 2016-09-02 MED ORDER — INSULIN ASPART PROT & ASPART (70-30 MIX) 100 UNIT/ML ~~LOC~~ SUSP: 7.0000 [IU] | Freq: Two times a day (BID) | SUBCUTANEOUS | 2 refills | Status: DC

## 2016-09-02 NOTE — ED Notes (Signed)
PT AVS reviewed. Pt verbalized understanding and reports he will be following up at [facility]. Denies pain or SI/HI at this time. Ambulatory out of department with all of pt belongings

## 2016-09-02 NOTE — Discharge Instructions (Signed)
Please keep taking your medications as prescribed.   Follow up as discussed with behavioral health specialist. Do not drink or take illicit substances (drugs)

## 2016-09-02 NOTE — Progress Notes (Signed)
Pt self administered Cpap tolerating well.  Sleeping no issues RT will monitor.

## 2017-03-14 ENCOUNTER — Other Ambulatory Visit: Payer: Self-pay

## 2017-03-14 ENCOUNTER — Encounter (HOSPITAL_COMMUNITY): Payer: Self-pay

## 2017-03-14 ENCOUNTER — Emergency Department (HOSPITAL_COMMUNITY)
Admission: EM | Admit: 2017-03-14 | Discharge: 2017-03-15 | Disposition: A | Discharge status: Other Acute Inpatient Hospital | Payer: Self-pay | Attending: Emergency Medicine | Admitting: Emergency Medicine

## 2017-03-14 DIAGNOSIS — F1910 Other psychoactive substance abuse, uncomplicated: Secondary | ICD-10-CM

## 2017-03-14 DIAGNOSIS — Z794 Long term (current) use of insulin: Secondary | ICD-10-CM | POA: Insufficient documentation

## 2017-03-14 DIAGNOSIS — F329 Major depressive disorder, single episode, unspecified: Secondary | ICD-10-CM | POA: Insufficient documentation

## 2017-03-14 DIAGNOSIS — Z7982 Long term (current) use of aspirin: Secondary | ICD-10-CM | POA: Insufficient documentation

## 2017-03-14 DIAGNOSIS — I10 Essential (primary) hypertension: Secondary | ICD-10-CM | POA: Insufficient documentation

## 2017-03-14 DIAGNOSIS — Z79899 Other long term (current) drug therapy: Secondary | ICD-10-CM | POA: Insufficient documentation

## 2017-03-14 DIAGNOSIS — E119 Type 2 diabetes mellitus without complications: Secondary | ICD-10-CM | POA: Insufficient documentation

## 2017-03-14 DIAGNOSIS — R45851 Suicidal ideations: Secondary | ICD-10-CM | POA: Insufficient documentation

## 2017-03-14 LAB — ETHANOL: Alcohol, Ethyl (B): <10 mg/dL (ref <10)

## 2017-03-14 LAB — COMPREHENSIVE METABOLIC PANEL
ALBUMIN: 3.5 g/dL (ref 3.5–5.0)
ALT: 30 U/L (ref 17–63)
AST: 26 U/L (ref 15–41)
Alkaline Phosphatase: 59 U/L (ref 38–126)
Anion gap: 12 (ref 5–15)
BILIRUBIN TOTAL: 0.6 mg/dL (ref 0.3–1.2)
BUN: 12 mg/dL (ref 6–20)
CHLORIDE: 98 mmol/L — AB (ref 101–111)
CO2: 25 mmol/L (ref 22–32)
Calcium: 8.8 mg/dL — ABNORMAL LOW (ref 8.9–10.3)
Creatinine, Ser: 0.99 mg/dL (ref 0.61–1.24)
GFR calc Af Amer: >60 mL/min (ref >60)
GFR calc non Af Amer: >60 mL/min (ref >60)
GLUCOSE: 219 mg/dL — AB (ref 65–99)
POTASSIUM: 3.9 mmol/L (ref 3.5–5.1)
Sodium: 135 mmol/L (ref 135–145)
Total Protein: 6.7 g/dL (ref 6.5–8.1)

## 2017-03-14 LAB — RAPID URINE DRUG SCREEN, HOSP PERFORMED
AMPHETAMINES: NOT DETECTED
BARBITURATES: NOT DETECTED
BENZODIAZEPINES: NOT DETECTED
Cocaine: POSITIVE — AB
Opiates: NOT DETECTED
Tetrahydrocannabinol: NOT DETECTED

## 2017-03-14 LAB — CBC
HEMATOCRIT: 43.2 % (ref 39.0–52.0)
HEMOGLOBIN: 15 g/dL (ref 13.0–17.0)
MCH: 31.2 pg (ref 26.0–34.0)
MCHC: 34.7 g/dL (ref 30.0–36.0)
MCV: 89.8 fL (ref 78.0–100.0)
Platelets: 319 10*3/uL (ref 150–400)
RBC: 4.81 MIL/uL (ref 4.22–5.81)
RDW: 12.9 % (ref 11.5–15.5)
WBC: 6.5 10*3/uL (ref 4.0–10.5)

## 2017-03-14 LAB — SALICYLATE LEVEL: Salicylate Lvl: <7 mg/dL (ref 2.8–30.0)

## 2017-03-14 LAB — ACETAMINOPHEN LEVEL: Acetaminophen (Tylenol), Serum: <10 ug/mL — ABNORMAL LOW (ref 10–30)

## 2017-03-14 MED ORDER — ONDANSETRON HCL 4 MG PO TABS: 4.0000 mg | ORAL_TABLET | Freq: Three times a day (TID) | ORAL | Status: DC | PRN

## 2017-03-14 MED ORDER — ACETAMINOPHEN 325 MG PO TABS: 650.0000 mg | ORAL_TABLET | ORAL | Status: DC | PRN

## 2017-03-14 NOTE — ED Notes (Signed)
Regular Diet Ordered for Dinner. 

## 2017-03-14 NOTE — ED Provider Notes (Signed)
[place] EMERGENCY DEPARTMENT Provider Note   CSN: [id number] Arrival date & time: 03/14/17  1453     History   Chief Complaint No chief complaint on file.   HPI Jonathan Austin is a 52 y.o. male.  52 year old male with a history of alcohol abuse and crack cocaine abuse as well as hypertension, dyslipidemia, and diabetes presents to the emergency department for suicidal thoughts.  He reports 1 week of worsening suicidal ideations with plan to cut his wrist with a razor blade.  He reports worsening depression which he self medicates with alcohol and crack cocaine.  He last drank 2 40-ounce beers today.  He did smoke crack earlier today.  He denies any other illicit drug use.  He attributes his waxing and waning depression to his son and stepson's death approximately 4 years ago.  He did recently start a new counseling session recently.      Past Medical History:  Diagnosis Date  . Cellulitis 06/28/2013  . Cocaine abuse ([place]) 06/28/2013  . Cocaine abuse with cocaine-induced mood disorder ([place])   . Depression   . Diabetes mellitus   . High cholesterol   . Hypertension   . Scrotal infection 06/28/2013  . Sleep apnea   . Suicidal ideations     Patient Active Problem List   Diagnosis Date Noted  . Cocaine use disorder, severe, dependence ([place]) 07/19/2016  . Major depressive disorder, recurrent episode ([place]) 07/16/2016  . Alcohol use disorder, severe, dependence ([place]) 07/16/2016  . Thoracic aortic atherosclerosis ([place]) 05/09/2016  . Influenza B 05/09/2016  . Dehydration   . Lactic acidosis   . Severe recurrent major depression without psychotic features ([place]) 02/21/2016  . AKI (acute kidney injury) ([place]) 01/20/2016  . HTN (hypertension) 09/20/2015  . HLD (hyperlipidemia) 09/20/2015  . Substance abuse ([place]) 08/30/2014  . Substance induced mood disorder ([place]) 05/07/2014  . Cocaine abuse with cocaine-induced mood disorder ([place])   . Alcohol abuse   . Sinus  tachycardia 02/12/2014  . Marijuana abuse 06/28/2013  . Diabetes mellitus type II, uncontrolled ([place]) 12/09/2012    Past Surgical History:  Procedure Laterality Date  . TONSILLECTOMY         Home Medications    Prior to Admission medications   Medication Sig Start Date End Date Taking? Authorizing Provider  acetaminophen (TYLENOL) 325 MG tablet Take 2 tablets (650 mg total) by mouth every 6 (six) hours as needed for mild pain, moderate pain or fever. 07/19/16   Lindell Spar I, NP  amLODipine (NORVASC) 5 MG tablet Take 1 tablet (5 mg total) by mouth daily. For high blood pressure 09/02/16   Pattricia Boss, MD  aspirin EC 81 MG tablet Take 81 mg by mouth daily.    [provider]  cholecalciferol (VITAMIN D) 1000 units tablet Take 1,000 Units by mouth daily.    [provider]  hydrocortisone cream 1 % Apply topically 3 (three) times daily as needed for itching (apply to itching areas.). 07/19/16   Lindell Spar I, NP  hydrOXYzine (ATARAX/VISTARIL) 25 MG tablet Take 1 tablet (25 mg total) by mouth every 6 (six) hours as needed for anxiety. 07/19/16   Lindell Spar I, NP  insulin aspart protamine- aspart (NOVOLOG MIX 70/30) (70-30) 100 UNIT/ML injection Inject 0.07-0.12 mLs (7-12 Units total) into the skin 2 (two) times daily with a meal. 7 units in the morning and 12 units in the evening. 09/02/16   Pattricia Boss, MD  metFORMIN (GLUCOPHAGE) 500 MG tablet  Take 1 tablet (500 mg total) by mouth 2 (two) times daily with a meal. For diabetes management 09/02/16   Pattricia Boss, MD  sertraline (ZOLOFT) 25 MG tablet Take 1 tablet (25 mg total) by mouth daily. For depression 09/02/16   Pattricia Boss, MD  simvastatin (ZOCOR) 20 MG tablet Take 1 tablet (20 mg total) by mouth daily. For high cholesterol 09/02/16   Pattricia Boss, MD  traZODone (DESYREL) 50 MG tablet Take 1 tablet (50 mg total) by mouth at bedtime as needed for sleep. 07/19/16   Encarnacion Slates, NP    Family History Family  History  Problem Relation Age of Onset  . Hypertension Mother   . Hypertension Father   . Diabetes Father     Social History Social History   Tobacco Use  . Smoking status: Never Smoker  . Smokeless tobacco: Never Used  Substance Use Topics  . Alcohol use: Yes    Comment: drinks beer 40 oz x 2 daily  . Drug use: Yes    Types: Cocaine, Marijuana    Comment: smokes 2g crack daily      Allergies   Patient has no known allergies.   Review of Systems Review of Systems Ten systems reviewed and are negative for acute change, except as noted in the HPI.    Physical Exam Updated Vital Signs BP 130/88 (BP Location: Right Arm)   Pulse 84   Temp 97.6 F (36.4 C) (Oral)   Resp 18   SpO2 100%   Physical Exam  Constitutional: He is oriented to person, place, and time. He appears well-developed and well-nourished. No distress.  Sleepy, cooperative  HENT:  Head: Normocephalic and atraumatic.  Eyes: Conjunctivae and EOM are normal. No scleral icterus.  Neck: Normal range of motion.  Pulmonary/Chest: Effort normal. No respiratory distress.  Respirations even and unlabored  Musculoskeletal: Normal range of motion.  Neurological: He is alert and oriented to person, place, and time. He exhibits normal muscle tone. Coordination normal.  Skin: Skin is warm and dry. No rash noted. He is not diaphoretic. No erythema. No pallor.  Psychiatric: He has a normal mood and affect. His behavior is normal.  +SI with plan to cut wrists with razor blade  Nursing note and vitals reviewed.    ED Treatments / Results  Labs (all labs ordered are listed, but only abnormal results are displayed) Labs Reviewed  COMPREHENSIVE METABOLIC PANEL - Abnormal; Notable for the following components:      Result Value   Chloride 98 (*)    Glucose, Bld 219 (*)    Calcium 8.8 (*)    All other components within normal limits  ACETAMINOPHEN LEVEL - Abnormal; Notable for the following components:    Acetaminophen (Tylenol), Serum <10 (*)    All other components within normal limits  RAPID URINE DRUG SCREEN, HOSP PERFORMED - Abnormal; Notable for the following components:   Cocaine POSITIVE (*)    All other components within normal limits  ETHANOL  SALICYLATE LEVEL  CBC    EKG  EKG Interpretation None       Radiology No results found.  Procedures Procedures (including critical care time)  Medications Ordered in ED Medications  ondansetron (ZOFRAN) tablet 4 mg (not administered)  acetaminophen (TYLENOL) tablet 650 mg (not administered)     Initial Impression / Assessment and Plan / ED Course  I have reviewed the triage vital signs and the nursing notes.  Pertinent labs & imaging results that were available  during my care of the patient were reviewed by me and considered in my medical decision making (see chart for details).     52 year old male presents for suicidal ideations.  He has been medically cleared and evaluated by TTS who recommend additional inpatient management.  No beds available at [facility].  TTS to seek placement.  Disposition to be determined by oncoming ED provider.   Final Clinical Impressions(s) / ED Diagnoses   Final diagnoses:  Suicidal ideation  Polysubstance abuse [facility])    ED Discharge Orders    None       Antonietta Breach, PA-C 03/15/17 [id number]    Carmin Muskrat, MD 03/17/17 1500

## 2017-03-14 NOTE — ED Notes (Signed)
Patient changed into purple scrubs, belongings in labeled patient belongings bag. Patient waiting in POD A Hallway.

## 2017-03-14 NOTE — ED Notes (Signed)
Called staffing for sitter , no sitter available until possibly 7pm.

## 2017-03-14 NOTE — BH Assessment (Addendum)
Tele Assessment Note   Patient Name: Jonathan Austin MRN: [id number] Referring Physician: Antonietta Breach, PA Location of Patient: [facility] Location of Provider: [place]  Elisa Sorlie is an 52 y.o. male who came voluntarily to the [facility] tonight due to [place] with a plan to cut his wrists. Pt sts he has been feeling depression for about 1 week. Pt sts he tried to cut his wrists once about 5-6 years ago and sts this was his only attempt. Pt denies any HI, SHI and AVH. Pt sts his primary stressor is his substance use. Pt sts he uses crack cocaine and alcohol daily. Pt tested positive for cocaine tonight in the ED. BAL was negative. Pt has been psychiatrically hospitalized multiple times including 2 IP stays in 2018 for similar symptoms. Pt's symptoms of depression including sadness, fatigue, decreased self esteem, self isolation, lack of motivation for activities and pleasure, irritability, negative outlook, difficulty thinking & concentrating, feeling helpless and hopeless at times, and sleep disturbances. Pt denies symptoms of anxiety currently.  Pt sts he has no psychiatrist or therapist currently and is not prescribed psychiatric medications.   Pt sts he lives with his wife. Pt sts he has a GED and is currently unemployed but not receiving disability income. Pt sts he has 1 adult daughter. Pt sts about 4 years ago within a short period his son and his step-son both died which he sts has been an ongoing source of sadness for him. Pt sts he is on probation but did not disclose the reason. Pt denied any pending charges. Pt denies access to guns. Pt denies any physical aggression or violent outbursts. Pt sts he has sleep apnea and sleeps only 3-4 hours per night.  Pt sts his appetite is good with no recent weight changes. Pt sts he drinks at least 2-40 oz beers daily and uses about 2 gms of crack cocaine daily. Pt sts he smokes cannabis about once a month.   Pt was dressed in scrubs  and  sitting on his hospital bed. Pt had to be woken up for this assessment and continually fell asleep throughout. Pt was cooperative, apologetic and polite. Pt kept poor eye contact due to drowseiness, spoke in a soft tone and at a slow. Pt answered most questions with yes/no or a single word answer. Pt was oriented x 3, to person, place and situation.   Diagnosis: Substance-Induced Mood D/O; Cocaine Use D/O, Severe; Alcohol Use D/O, Severe; Cannabis Use D/O, Mild  Past Medical History:  Past Medical History:  Diagnosis Date  . Cellulitis 06/28/2013  . Cocaine abuse ([place]) 06/28/2013  . Cocaine abuse with cocaine-induced mood disorder ([place])   . Depression   . Diabetes mellitus   . High cholesterol   . Hypertension   . Scrotal infection 06/28/2013  . Sleep apnea   . Suicidal ideations     Past Surgical History:  Procedure Laterality Date  . TONSILLECTOMY      Family History:  Family History  Problem Relation Age of Onset  . Hypertension Mother   . Hypertension Father   . Diabetes Father     Social History:  reports that  has never smoked. he has never used smokeless tobacco. He reports that he drinks alcohol. He reports that he uses drugs. Drugs: Cocaine and Marijuana.  Additional Social History:  Alcohol / Drug Use Prescriptions: SEE MAR History of alcohol / drug use?: Yes Longest period of sobriety (when/how long): UNKNOWN Substance #1 Name of Substance  1: CRACK COCAINE 1 - Age of First Use: UNKNOWN 1 - Amount (size/oz): 2 GM 1 - Frequency: DAILY 1 - Duration: ONGOING 1 - Last Use / Amount: TODAY Substance #2 Name of Substance 2: ALCOHOL 2 - Age of First Use: UNKNOWN 2 - Amount (size/oz): 2-40 OZ BEERS 2 - Frequency: DAILY 2 - Duration: ONGOING 2 - Last Use / Amount: YESTERDAY Substance #3 Name of Substance 3: CANNABIS 3 - Age of First Use: UNKNOWN 3 - Amount (size/oz): UNKNOWN 3 - Frequency: 1 X MONTH 3 - Duration: ONGOING 3 - Last Use / Amount: FEW WEEKS  AGO  CIWA: CIWA-Ar BP: 140/87 Pulse Rate: (!) 104 COWS:    Allergies: No Known Allergies  Home Medications:  (Not in a hospital admission)  OB/GYN Status:  No LMP for male patient.  General Assessment Data Location of Assessment: [facility] ED TTS Assessment: In system Is this a Tele or Face-to-Face Assessment?: Tele Assessment Is this an Initial Assessment or a Re-assessment for this encounter?: Initial Assessment Marital status: Married Is patient pregnant?: No Pregnancy Status: No Living Arrangements: Spouse/significant other Can pt return to current living arrangement?: Yes Admission Status: Voluntary Is patient capable of signing voluntary admission?: Yes Referral Source: Self/Family/Friend Insurance type: SELF PAY     Crisis Care Plan Living Arrangements: Spouse/significant other Name of Psychiatrist: NONE Name of Therapist: NONE  Education Status Is patient currently in school?: No Highest grade of school patient has completed: GED  Risk to self with the past 6 months Suicidal Ideation: Yes-Currently Present Has patient been a risk to self within the past 6 months prior to admission? : No Suicidal Intent: No-Not Currently/Within Last 6 Months Has patient had any suicidal intent within the past 6 months prior to admission? : No Is patient at risk for suicide?: No Suicidal Plan?: No-Not Currently/Within Last 6 Months Has patient had any suicidal plan within the past 6 months prior to admission? : No Access to Means: (DENIES [place]) What has been your use of drugs/alcohol within the last 12 months?: DAILY USE Previous Attempts/Gestures: Yes How many times?: 1(STS TRIED TO CUT WRISTS 5-6 YRS AGO) Other Self Harm Risks: NONE REPORTED Triggers for Past Attempts: None known Intentional Self Injurious Behavior: None Family Suicide History: Unknown Recent stressful life event(s): Other (Comment)(SA) Persecutory voices/beliefs?: No Depression: Yes Depression  Symptoms: Despondent, Insomnia, Isolating, Fatigue, Loss of interest in usual pleasures, Feeling worthless/self pity, Feeling angry/irritable Substance abuse history and/or treatment for substance abuse?: Yes Suicide prevention information given to non-admitted patients: Not applicable  Risk to Others within the past 6 months Homicidal Ideation: No(DENIES) Does patient have any lifetime risk of violence toward others beyond the six months prior to admission? : No(DENIES) Thoughts of Harm to Others: No(DENIES) Current Homicidal Intent: No Current Homicidal Plan: No Access to Homicidal Means: No History of harm to others?: No(DENIES) Assessment of Violence: None Noted Does patient have access to weapons?: No Criminal Charges Pending?: No Does patient have a court date: No Is patient on probation?: Yes  Psychosis Hallucinations: None noted(DENIES) Delusions: None noted  Mental Status Report Appearance/Hygiene: In scrubs, Unremarkable Eye Contact: Poor Motor Activity: Freedom of movement Speech: Logical/coherent, Slow Level of Consciousness: Drowsy, Sleeping(CONINTUALLY FALLING ASLEEP) Mood: Depressed, Pleasant Affect: Depressed Anxiety Level: None Thought Processes: Relevant, Coherent Judgement: Impaired Orientation: Person, Place, Situation Obsessive Compulsive Thoughts/Behaviors: None  Cognitive Functioning Concentration: Poor(SLEEPY) Memory: Recent Intact, Remote Intact IQ: Average Insight: see judgement above Impulse Control: Poor Appetite: Good Weight  Loss: 0 Weight Gain: 0 Sleep: Decreased Total Hours of Sleep: (3-4 HRS - STS HE HAS SLEEP APNEA) Vegetative Symptoms: None  ADLScreening [facility] Assessment Services) Patient's cognitive ability adequate to safely complete daily activities?: Yes Patient able to express need for assistance with ADLs?: Yes Independently performs ADLs?: Yes (appropriate for developmental age)  Prior Inpatient Therapy Prior Inpatient  Therapy: Yes Prior Therapy Dates: 2016, 2018 Prior Therapy Facilty/Provider(s): [facility] Reason for Treatment: SA, MDD  Prior Outpatient Therapy Prior Outpatient Therapy: No Does patient have an ACCT team?: No Does patient have Intensive In-House Services?  : No Does patient have [facility] services? : No Does patient have P4CC services?: No  ADL Screening (condition at time of admission) Patient's cognitive ability adequate to safely complete daily activities?: Yes Patient able to express need for assistance with ADLs?: Yes Independently performs ADLs?: Yes (appropriate for developmental age)       Abuse/Neglect Assessment (Assessment to be complete while patient is alone) Physical Abuse: Denies Verbal Abuse: Denies Sexual Abuse: Denies Exploitation of patient/patient's resources: Denies Self-Neglect: Denies     Regulatory affairs officer (For Healthcare) Does Patient Have a Medical Advance Directive?: No Would patient like information on creating a medical advance directive?: No - Patient declined    Additional Information 1:1 In Past 12 Months?: No CIRT Risk: No Elopement Risk: No Does patient have medical clearance?: Yes     Disposition:  Disposition Initial Assessment Completed for this Encounter: Yes Disposition of Patient: Other dispositions(PENDING REVIEW W [facility] EXTENDER) Other disposition(s): Other (Comment)  This service was provided via telemedicine using a 2-way, interactive audio and video technology.  Names of all persons participating in this telemedicine service and their role in this encounter. Name: Faylene Kurtz, MS, [facility], CRC Role: Triage Specialist  Name: [name] Role: Patient  Name:  Role:   Name:  Role:    Consulted with Patriciaann Clan PA who recommends Inpatient treatment. Currently no appropriate beds available at [facility]. Will seek outside placement.   Spoke with Antonietta Breach, PA and advised of recommendation.   Faylene Kurtz, MS, CRC, [place]  Triage Specialist [facility] T 03/14/2017 11:30 PM

## 2017-03-14 NOTE — ED Triage Notes (Signed)
Onset of SI one week ago. Last time had alcohol today beer 40 oz x 2 and usually drinks this much every day.  Pt smokes around 2g crack daily.  Pt reports that he is feeling depressed and has SI thoughts, no plan.

## 2017-03-15 ENCOUNTER — Other Ambulatory Visit: Payer: Self-pay

## 2017-03-15 ENCOUNTER — Encounter (HOSPITAL_COMMUNITY): Payer: Self-pay

## 2017-03-15 ENCOUNTER — Inpatient Hospital Stay (HOSPITAL_COMMUNITY)
Admission: AD | Admit: 2017-03-15 | Discharge: 2017-03-20 | DRG: 885 | Disposition: A | Discharge status: Home / Self Care | Payer: Federal, State, Local not specified - Other | Source: Intra-hospital | Attending: Psychiatry | Admitting: Psychiatry

## 2017-03-15 DIAGNOSIS — F1920 Other psychoactive substance dependence, uncomplicated: Secondary | ICD-10-CM | POA: Diagnosis present

## 2017-03-15 DIAGNOSIS — I10 Essential (primary) hypertension: Secondary | ICD-10-CM | POA: Diagnosis present

## 2017-03-15 DIAGNOSIS — F1020 Alcohol dependence, uncomplicated: Secondary | ICD-10-CM | POA: Diagnosis present

## 2017-03-15 DIAGNOSIS — E119 Type 2 diabetes mellitus without complications: Secondary | ICD-10-CM | POA: Diagnosis present

## 2017-03-15 DIAGNOSIS — F1424 Cocaine dependence with cocaine-induced mood disorder: Secondary | ICD-10-CM | POA: Diagnosis present

## 2017-03-15 DIAGNOSIS — F1290 Cannabis use, unspecified, uncomplicated: Secondary | ICD-10-CM | POA: Diagnosis not present

## 2017-03-15 DIAGNOSIS — Z818 Family history of other mental and behavioral disorders: Secondary | ICD-10-CM | POA: Diagnosis not present

## 2017-03-15 DIAGNOSIS — F1120 Opioid dependence, uncomplicated: Secondary | ICD-10-CM

## 2017-03-15 DIAGNOSIS — F322 Major depressive disorder, single episode, severe without psychotic features: Secondary | ICD-10-CM | POA: Insufficient documentation

## 2017-03-15 DIAGNOSIS — G4730 Sleep apnea, unspecified: Secondary | ICD-10-CM | POA: Diagnosis present

## 2017-03-15 DIAGNOSIS — Z794 Long term (current) use of insulin: Secondary | ICD-10-CM | POA: Diagnosis not present

## 2017-03-15 DIAGNOSIS — R4587 Impulsiveness: Secondary | ICD-10-CM | POA: Diagnosis not present

## 2017-03-15 DIAGNOSIS — F419 Anxiety disorder, unspecified: Secondary | ICD-10-CM | POA: Diagnosis present

## 2017-03-15 DIAGNOSIS — Z811 Family history of alcohol abuse and dependence: Secondary | ICD-10-CM

## 2017-03-15 DIAGNOSIS — Z833 Family history of diabetes mellitus: Secondary | ICD-10-CM | POA: Diagnosis not present

## 2017-03-15 DIAGNOSIS — R45851 Suicidal ideations: Secondary | ICD-10-CM | POA: Diagnosis present

## 2017-03-15 DIAGNOSIS — F332 Major depressive disorder, recurrent severe without psychotic features: Secondary | ICD-10-CM | POA: Diagnosis not present

## 2017-03-15 DIAGNOSIS — E785 Hyperlipidemia, unspecified: Secondary | ICD-10-CM | POA: Diagnosis present

## 2017-03-15 DIAGNOSIS — F1210 Cannabis abuse, uncomplicated: Secondary | ICD-10-CM | POA: Diagnosis present

## 2017-03-15 DIAGNOSIS — I252 Old myocardial infarction: Secondary | ICD-10-CM

## 2017-03-15 DIAGNOSIS — Z7982 Long term (current) use of aspirin: Secondary | ICD-10-CM

## 2017-03-15 DIAGNOSIS — E7800 Pure hypercholesterolemia, unspecified: Secondary | ICD-10-CM | POA: Diagnosis present

## 2017-03-15 DIAGNOSIS — Z79899 Other long term (current) drug therapy: Secondary | ICD-10-CM

## 2017-03-15 DIAGNOSIS — Z8249 Family history of ischemic heart disease and other diseases of the circulatory system: Secondary | ICD-10-CM | POA: Diagnosis not present

## 2017-03-15 DIAGNOSIS — F1490 Cocaine use, unspecified, uncomplicated: Secondary | ICD-10-CM | POA: Diagnosis not present

## 2017-03-15 LAB — GLUCOSE, CAPILLARY: Glucose-Capillary: 410 mg/dL — ABNORMAL HIGH (ref 65–99)

## 2017-03-15 MED ORDER — HYDROXYZINE HCL 25 MG PO TABS: 25.0000 mg | ORAL_TABLET | Freq: Four times a day (QID) | ORAL | Status: DC | PRN

## 2017-03-15 MED ORDER — CLONIDINE HCL 0.1 MG PO TABS: 0.1000 mg | ORAL_TABLET | Freq: Every day | ORAL | Status: DC

## 2017-03-15 MED ORDER — INSULIN ASPART 100 UNIT/ML ~~LOC~~ SOLN: 0.0000 [IU] | Freq: Every day | SUBCUTANEOUS | Status: DC

## 2017-03-15 MED ORDER — LOPERAMIDE HCL 2 MG PO CAPS: 2.0000 mg | ORAL_CAPSULE | ORAL | Status: DC | PRN

## 2017-03-15 MED ORDER — AMLODIPINE BESYLATE 5 MG PO TABS
5.0000 mg | ORAL_TABLET | Freq: Every day | ORAL | Status: DC
  Administered 2017-03-15: 5 mg via ORAL
  Filled 2017-03-15: qty 1

## 2017-03-15 MED ORDER — VITAMIN B-1 100 MG PO TABS
100.0000 mg | ORAL_TABLET | Freq: Every day | ORAL | Status: DC
  Administered 2017-03-16 – 2017-03-20 (×5): 100 mg via ORAL
  Filled 2017-03-15 (×7): qty 1

## 2017-03-15 MED ORDER — ACETAMINOPHEN 325 MG PO TABS: 650.0000 mg | ORAL_TABLET | Freq: Four times a day (QID) | ORAL | Status: DC | PRN

## 2017-03-15 MED ORDER — INSULIN ASPART 100 UNIT/ML ~~LOC~~ SOLN
0.0000 [IU] | Freq: Three times a day (TID) | SUBCUTANEOUS | Status: DC
  Administered 2017-03-16: 11 [IU] via SUBCUTANEOUS
  Administered 2017-03-16 (×2): 4 [IU] via SUBCUTANEOUS
  Administered 2017-03-17 – 2017-03-18 (×4): 7 [IU] via SUBCUTANEOUS
  Administered 2017-03-18: 11 [IU] via SUBCUTANEOUS
  Administered 2017-03-18: 4 [IU] via SUBCUTANEOUS
  Administered 2017-03-19 (×2): 7 [IU] via SUBCUTANEOUS
  Administered 2017-03-19: 4 [IU] via SUBCUTANEOUS
  Administered 2017-03-20: 3 [IU] via SUBCUTANEOUS

## 2017-03-15 MED ORDER — ONDANSETRON 4 MG PO TBDP: 4.0000 mg | ORAL_TABLET | Freq: Four times a day (QID) | ORAL | Status: AC | PRN

## 2017-03-15 MED ORDER — METHOCARBAMOL 500 MG PO TABS: 500.0000 mg | ORAL_TABLET | Freq: Three times a day (TID) | ORAL | Status: AC | PRN

## 2017-03-15 MED ORDER — ADULT MULTIVITAMIN W/MINERALS CH
1.0000 | ORAL_TABLET | Freq: Every day | ORAL | Status: DC
  Administered 2017-03-16 – 2017-03-20 (×5): 1 via ORAL
  Filled 2017-03-15 (×7): qty 1

## 2017-03-15 MED ORDER — METFORMIN HCL 500 MG PO TABS
1000.0000 mg | ORAL_TABLET | Freq: Two times a day (BID) | ORAL | Status: DC
  Administered 2017-03-16 – 2017-03-20 (×9): 1000 mg via ORAL
  Filled 2017-03-15 (×5): qty 2
  Filled 2017-03-15: qty 28
  Filled 2017-03-15 (×2): qty 2
  Filled 2017-03-15: qty 28
  Filled 2017-03-15 (×4): qty 2

## 2017-03-15 MED ORDER — LORAZEPAM 1 MG PO TABS: 1.0000 mg | ORAL_TABLET | Freq: Four times a day (QID) | ORAL | Status: AC | PRN

## 2017-03-15 MED ORDER — INSULIN ASPART 100 UNIT/ML ~~LOC~~ SOLN: 0.0000 [IU] | Freq: Three times a day (TID) | SUBCUTANEOUS | Status: DC

## 2017-03-15 MED ORDER — CLONIDINE HCL 0.1 MG PO TABS: 0.1000 mg | ORAL_TABLET | ORAL | Status: DC

## 2017-03-15 MED ORDER — CLONIDINE HCL 0.1 MG PO TABS
0.1000 mg | ORAL_TABLET | Freq: Four times a day (QID) | ORAL | Status: DC
  Filled 2017-03-15 (×2): qty 1

## 2017-03-15 MED ORDER — INSULIN GLARGINE 100 UNIT/ML ~~LOC~~ SOLN
12.0000 [IU] | Freq: Every day | SUBCUTANEOUS | Status: DC
  Administered 2017-03-15 – 2017-03-19 (×5): 12 [IU] via SUBCUTANEOUS
  Filled 2017-03-15: qty 0.12

## 2017-03-15 MED ORDER — SIMVASTATIN 20 MG PO TABS
20.0000 mg | ORAL_TABLET | Freq: Every day | ORAL | Status: DC
  Administered 2017-03-15: 20 mg via ORAL
  Filled 2017-03-15: qty 1

## 2017-03-15 MED ORDER — METFORMIN HCL 500 MG PO TABS
500.0000 mg | ORAL_TABLET | Freq: Two times a day (BID) | ORAL | Status: DC
  Filled 2017-03-15: qty 1

## 2017-03-15 MED ORDER — INSULIN ASPART 100 UNIT/ML ~~LOC~~ SOLN
8.0000 [IU] | Freq: Once | SUBCUTANEOUS | Status: AC
  Administered 2017-03-15: 8 [IU] via SUBCUTANEOUS

## 2017-03-15 MED ORDER — AMLODIPINE BESYLATE 5 MG PO TABS
5.0000 mg | ORAL_TABLET | Freq: Every day | ORAL | Status: DC
  Administered 2017-03-16 – 2017-03-20 (×5): 5 mg via ORAL
  Filled 2017-03-15: qty 7
  Filled 2017-03-15 (×6): qty 1

## 2017-03-15 MED ORDER — VITAMIN B-1 100 MG PO TABS
100.0000 mg | ORAL_TABLET | Freq: Every day | ORAL | Status: DC
  Administered 2017-03-15: 100 mg via ORAL
  Filled 2017-03-15: qty 1

## 2017-03-15 MED ORDER — INSULIN ASPART 100 UNIT/ML ~~LOC~~ SOLN
4.0000 [IU] | Freq: Three times a day (TID) | SUBCUTANEOUS | Status: DC
  Administered 2017-03-16 – 2017-03-20 (×14): 4 [IU] via SUBCUTANEOUS

## 2017-03-15 MED ORDER — LORAZEPAM 2 MG/ML IJ SOLN: 0.0000 mg | Freq: Four times a day (QID) | INTRAMUSCULAR | Status: DC

## 2017-03-15 MED ORDER — DICYCLOMINE HCL 20 MG PO TABS: 20.0000 mg | ORAL_TABLET | Freq: Four times a day (QID) | ORAL | Status: AC | PRN

## 2017-03-15 MED ORDER — NAPROXEN 500 MG PO TABS: 500.0000 mg | ORAL_TABLET | Freq: Two times a day (BID) | ORAL | Status: AC | PRN

## 2017-03-15 MED ORDER — SERTRALINE HCL 25 MG PO TABS
25.0000 mg | ORAL_TABLET | Freq: Every day | ORAL | Status: DC
  Administered 2017-03-16 – 2017-03-17 (×2): 25 mg via ORAL
  Filled 2017-03-15 (×4): qty 1

## 2017-03-15 MED ORDER — THIAMINE HCL 100 MG/ML IJ SOLN: 100.0000 mg | Freq: Once | INTRAMUSCULAR | Status: DC

## 2017-03-15 MED ORDER — MAGNESIUM HYDROXIDE 400 MG/5ML PO SUSP: 30.0000 mL | Freq: Every day | ORAL | Status: DC | PRN

## 2017-03-15 MED ORDER — ASPIRIN EC 81 MG PO TBEC
81.0000 mg | DELAYED_RELEASE_TABLET | Freq: Every day | ORAL | Status: DC
  Administered 2017-03-16 – 2017-03-20 (×5): 81 mg via ORAL
  Filled 2017-03-15 (×3): qty 1
  Filled 2017-03-15: qty 7
  Filled 2017-03-15 (×3): qty 1

## 2017-03-15 MED ORDER — LOPERAMIDE HCL 2 MG PO CAPS: 2.0000 mg | ORAL_CAPSULE | ORAL | Status: AC | PRN

## 2017-03-15 MED ORDER — LORAZEPAM 1 MG PO TABS: 0.0000 mg | ORAL_TABLET | Freq: Two times a day (BID) | ORAL | Status: DC

## 2017-03-15 MED ORDER — THIAMINE HCL 100 MG/ML IJ SOLN: 100.0000 mg | Freq: Every day | INTRAMUSCULAR | Status: DC

## 2017-03-15 MED ORDER — ALUM & MAG HYDROXIDE-SIMETH 200-200-20 MG/5ML PO SUSP
30.0000 mL | ORAL | Status: DC | PRN
Start: 2017-03-15 — End: 2017-03-21

## 2017-03-15 MED ORDER — LORAZEPAM 2 MG/ML IJ SOLN: 0.0000 mg | Freq: Two times a day (BID) | INTRAMUSCULAR | Status: DC

## 2017-03-15 MED ORDER — TRAZODONE HCL 50 MG PO TABS: 50.0000 mg | ORAL_TABLET | Freq: Every evening | ORAL | Status: DC | PRN

## 2017-03-15 MED ORDER — ASPIRIN EC 81 MG PO TBEC
81.0000 mg | DELAYED_RELEASE_TABLET | Freq: Every day | ORAL | Status: DC
  Administered 2017-03-15: 81 mg via ORAL
  Filled 2017-03-15: qty 1

## 2017-03-15 MED ORDER — METFORMIN HCL 500 MG PO TABS
500.0000 mg | ORAL_TABLET | Freq: Two times a day (BID) | ORAL | Status: DC
  Administered 2017-03-15: 500 mg via ORAL
  Filled 2017-03-15: qty 1

## 2017-03-15 MED ORDER — ONDANSETRON 4 MG PO TBDP: 4.0000 mg | ORAL_TABLET | Freq: Four times a day (QID) | ORAL | Status: DC | PRN

## 2017-03-15 MED ORDER — LORAZEPAM 1 MG PO TABS: 0.0000 mg | ORAL_TABLET | Freq: Four times a day (QID) | ORAL | Status: DC

## 2017-03-15 MED ORDER — SIMVASTATIN 20 MG PO TABS
20.0000 mg | ORAL_TABLET | Freq: Every day | ORAL | Status: DC
  Administered 2017-03-16 – 2017-03-20 (×5): 20 mg via ORAL
  Filled 2017-03-15 (×5): qty 1
  Filled 2017-03-15: qty 7
  Filled 2017-03-15: qty 1

## 2017-03-15 MED ORDER — SERTRALINE HCL 25 MG PO TABS
25.0000 mg | ORAL_TABLET | Freq: Every day | ORAL | Status: DC
  Administered 2017-03-15: 25 mg via ORAL
  Filled 2017-03-15: qty 1

## 2017-03-15 MED ORDER — HYDROXYZINE HCL 25 MG PO TABS: 25.0000 mg | ORAL_TABLET | Freq: Four times a day (QID) | ORAL | Status: AC | PRN

## 2017-03-15 NOTE — ED Notes (Signed)
Dinner tray in room 

## 2017-03-15 NOTE — ED Notes (Signed)
Pt using phone to notify wife that he will be transferred to [facility] tonight.

## 2017-03-15 NOTE — Tx Team (Signed)
Initial Treatment Plan 03/15/2017 10:52 PM [name] [facility]:[id number]    PATIENT STRESSORS: Health problems Substance abuse   PATIENT STRENGTHS: Capable of independent living Communication skills General fund of knowledge Motivation for treatment/growth Supportive family/friends   PATIENT IDENTIFIED PROBLEMS: Depression  Suicidal ideation  Substance abuse  "Get back in to one to one therapy when I leave"               DISCHARGE CRITERIA:  Improved stabilization in mood, thinking, and/or behavior Verbal commitment to aftercare and medication compliance Withdrawal symptoms are absent or subacute and managed without 24-hour nursing intervention  PRELIMINARY DISCHARGE PLAN: Outpatient therapy Medication management  PATIENT/FAMILY INVOLVEMENT: This treatment plan has been presented to and reviewed with the patient, [name].  The patient and family have been given the opportunity to ask questions and make suggestions.  Windell Moment, RN 03/15/2017, 10:52 PM

## 2017-03-15 NOTE — Progress Notes (Addendum)
Pt chart reviewed.  Pt assessed and meets inpatient criteria per Patriciaann Clan, PA. Pt referral packets faxed to the following:  [facility]  [facility]  [facility]  [facility]        Disposition CSW will continue to follow for placement.  Areatha Keas. Judi Cong, MSW, [place] Disposition Clinical Social Work [phone] (cell) [phone] (office)

## 2017-03-15 NOTE — ED Notes (Signed)
[place] transportation called regarding transportation to [facility] needed at 2015.

## 2017-03-15 NOTE — Progress Notes (Signed)
Jonathan Austin is a 52 year old male being admitted voluntarily to [facility] from [facility].  He came to the ED with suicidal ideation, daily alcohol use and ongoing depression.  During [facility] admission, he denies suicidal ideation and will contract for safety on the unit.  He denies HI or A/V hallucinations.  He was pleasant and cooperative.  Affect depressed.  He reported multiple family losses in the past 4 years that has increased his depression.  He denies any pain or discomfort and appears to be in no physical distress.  Oriented him to the unit.  Admission paperwork completed and signed.  Belongings searched and secured in locker # 10, no contraband found.  Skin assessment completed and no skin issues noted.  Q 15 minute checks initiated for safety.  We will continue to monitor the progress towards his goals.

## 2017-03-15 NOTE — Progress Notes (Signed)
Writer saw that pt is diabetic while admitting RN was doing admission.  CBG was obtained during admission process and was 410 mg/dL.  On-site provider was notified and provider met with pt face to face and additional orders were placed.  Medications administered per order.  Provider, S. Simon, PA, instructed writer not to administer sliding scale HS Novolog since pt received Novolog 8 units X1 for hyperglycemia tonight.  Medication education was provided.  PO fluids encouraged and provided.  Pt informed that CBG will be rechecked at approximately 0130 per provider's instruction and pt verbalized understanding.  Pt denies SI/HI, hallucinations, and pain.  He verbally contracts for safety and reports he will inform staff of needs and concerns.

## 2017-03-15 NOTE — ED Notes (Signed)
Pt's wife called inquiring about visiting hours. Will be here to visit.

## 2017-03-15 NOTE — ED Notes (Signed)
Belongings (white bag and security envelope) and envelope containing paperwork given to [employer]

## 2017-03-15 NOTE — ED Notes (Signed)
Pt. Using phone at nurses station

## 2017-03-15 NOTE — ED Notes (Signed)
Pt signed belongings inventory sheets, copy placed in Medical Records and sent to [facility]. Belongings will given to [place] upon arrival. Belongings include 1 white bag and 1 security envelope

## 2017-03-15 NOTE — ED Notes (Signed)
Pt signed voluntary consent for admission/treatment to [facility], faxed to [facility], copy placed in medical records.

## 2017-03-15 NOTE — ED Notes (Signed)
No snack given. Pt. Is sleeping, dinner ordered

## 2017-03-15 NOTE — ED Notes (Signed)
Pt in shower, linens changed

## 2017-03-15 NOTE — BH Assessment (Signed)
[place] Assessment Progress Note  Pt accepted to [facility] 302-2, to arrive tonight at 2030. Accepting provider is Patriciaann Clan, [place]. Accepting provider is Dr. Parke Poisson. Call report to [phone]. Myriam Jacobson, RN, notified by Laddie Aquas.   Kenna Gilbert. Lovena Le, [place], [place], LPCA Counselor

## 2017-03-16 ENCOUNTER — Encounter (HOSPITAL_COMMUNITY): Payer: Self-pay | Admitting: Registered Nurse

## 2017-03-16 DIAGNOSIS — R61 Generalized hyperhidrosis: Secondary | ICD-10-CM

## 2017-03-16 DIAGNOSIS — F419 Anxiety disorder, unspecified: Secondary | ICD-10-CM

## 2017-03-16 DIAGNOSIS — Z818 Family history of other mental and behavioral disorders: Secondary | ICD-10-CM

## 2017-03-16 DIAGNOSIS — Z598 Other problems related to housing and economic circumstances: Secondary | ICD-10-CM

## 2017-03-16 DIAGNOSIS — R450 Nervousness: Secondary | ICD-10-CM

## 2017-03-16 DIAGNOSIS — Z811 Family history of alcohol abuse and dependence: Secondary | ICD-10-CM

## 2017-03-16 DIAGNOSIS — F1920 Other psychoactive substance dependence, uncomplicated: Secondary | ICD-10-CM

## 2017-03-16 DIAGNOSIS — F332 Major depressive disorder, recurrent severe without psychotic features: Principal | ICD-10-CM

## 2017-03-16 DIAGNOSIS — R4587 Impulsiveness: Secondary | ICD-10-CM

## 2017-03-16 DIAGNOSIS — F1120 Opioid dependence, uncomplicated: Secondary | ICD-10-CM

## 2017-03-16 DIAGNOSIS — F1410 Cocaine abuse, uncomplicated: Secondary | ICD-10-CM

## 2017-03-16 DIAGNOSIS — F1010 Alcohol abuse, uncomplicated: Secondary | ICD-10-CM

## 2017-03-16 LAB — GLUCOSE, CAPILLARY
GLUCOSE-CAPILLARY: 264 mg/dL — AB (ref 65–99)
GLUCOSE-CAPILLARY: 168 mg/dL — AB (ref 65–99)
Glucose-Capillary: 185 mg/dL — ABNORMAL HIGH (ref 65–99)
Glucose-Capillary: 199 mg/dL — ABNORMAL HIGH (ref 65–99)
Glucose-Capillary: 168 mg/dL — ABNORMAL HIGH (ref 65–99)

## 2017-03-16 LAB — HEMOGLOBIN A1C
Hgb A1c MFr Bld: 13.7 % — ABNORMAL HIGH (ref 4.8–5.6)
MEAN PLASMA GLUCOSE: 346.49 mg/dL
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
## 2017-03-16 NOTE — Progress Notes (Signed)
D    Pt is pleasant on approach   He is compliant with treatment and interacts well with staff and peers   Pt endorses depression and some anxiety    Pt requested the same combination of medications to help him sleep tonight since he got good sleep last night A   Verbal support given    Medications administered and effectiveness monitored   Q 15 min checks R   Pt is safe at present time

## 2017-03-16 NOTE — BHH Group Notes (Signed)
LCSW Group Therapy Note  03/16/2017 1:15pm  Type of Therapy/Topic:  Group Therapy:  Emotion Regulation  Participation Level:  Minimal   Description of Group:   The purpose of this group is to assist patients in learning to regulate negative emotions and experience positive emotions. Patients will be guided to discuss ways in which they have been vulnerable to their negative emotions. These vulnerabilities will be juxtaposed with experiences of positive emotions or situations, and patients will be challenged to use positive emotions to combat negative ones. Special emphasis will be placed on coping with negative emotions in conflict situations, and patients will process healthy conflict resolution skills.  Therapeutic Goals: 1. Patient will identify two positive emotions or experiences to reflect on in order to balance out negative emotions 2. Patient will label two or more emotions that they find the most difficult to experience 3. Patient will demonstrate positive conflict resolution skills through discussion and/or role plays  Summary of Patient Progress:  Jonathan Austin was attentive during today' processing group but did not actively participate in group discussion unless prompted by CSW. He shared that he struggles with anxiety and was able to identify the physical symptoms of anxiety. Jonathan Austin continues to show progress in the group setting with improving insight.   Therapeutic Modalities:   Cognitive Behavioral Therapy Feelings Identification Dialectical Behavioral Therapy   Kimber Relic Smart, LCSW 03/16/2017 2:40 PM

## 2017-03-16 NOTE — Progress Notes (Signed)
Patient ID: Jonathan Austin, male   DOB: 05-Apr-1965, 52 y.o.   MRN: [id number] D: Patient with blunted affect, but is calm/cooperative.  Pt reports that his sleep quality last night was good, reports a good appetite, reports his energy level as normal, rates his depression as 3 (10 being the highest level), denies being hopeless, and rates his anxiety level as 1(10 being the highest level).  Pt denies SI/HI/AVH, and states that his goal today is "my sobriety, my health", and states in order to meet this goal, he will attend "groups and eat at moderation". Pt has been visible in the milieu interacting with peers and attending group sessions, and states that he is motivated to recover.  A: Pt being maintained on Q15 minute safety checks for safety, all meds being given as ordered.    R: Pt denies having any current concerns, no signs/symptoms of distress noted, will continue to monitor.

## 2017-03-16 NOTE — Progress Notes (Signed)
CBG was 185 mg/dL when rechecked at 0133.

## 2017-03-16 NOTE — BHH Suicide Risk Assessment (Signed)
[facility] Admission Suicide Risk Assessment   Nursing information obtained from:  Patient Demographic factors:  Male Current Mental Status:  NA Loss Factors:  Loss of significant relationship(4 years ago lost son and then step son-recent death of fam) Historical Factors:  Prior suicide attempts, Family history of suicide, Family history of mental illness or substance abuse, NA Risk Reduction Factors:  Living with another person, especially a relative  Total Time spent with patient: 45 minutes Principal Problem: Severe recurrent major depression without psychotic features ([place]) Diagnosis:   Patient Active Problem List   Diagnosis Date Noted  . Polysubstance dependence including opioid drug with daily use ([place]) [F19.20] 03/16/2017  . MDD (major depressive disorder), severe ([place]) [F32.2] 03/15/2017  . Cocaine use disorder, severe, dependence ([place]) [F14.20] 07/19/2016  . Major depressive disorder, recurrent episode ([place]) [F33.9] 07/16/2016  . Alcohol use disorder, severe, dependence ([place]) [F10.20] 07/16/2016  . Thoracic aortic atherosclerosis ([place]) [I70.0] 05/09/2016  . Influenza B [J10.1] 05/09/2016  . Dehydration [E86.0]   . Lactic acidosis [E87.2]   . Severe recurrent major depression without psychotic features ([place]) [F33.2] 02/21/2016  . AKI (acute kidney injury) ([place]) [N17.9] 01/20/2016  . HTN (hypertension) [I10] 09/20/2015  . HLD (hyperlipidemia) [E78.5] 09/20/2015  . Substance abuse ([place]) [F19.10] 08/30/2014  . Substance induced mood disorder ([place]) [F19.94] 05/07/2014  . Cocaine abuse with cocaine-induced mood disorder ([place]) [F14.14]   . Alcohol abuse [F10.10]   . Sinus tachycardia [R00.0] 02/12/2014  . Marijuana abuse [F12.10] 06/28/2013  . Diabetes mellitus type II, uncontrolled ([place]) [E11.65] 12/09/2012    Continued Clinical Symptoms:  Alcohol Use Disorder Identification Test Final Score (AUDIT): 20 The "Alcohol Use Disorders Identification Test", Guidelines for Use in Primary  Care, Second Edition.  World Pharmacologist [facility]). Score between 0-7:  no or low risk or alcohol related problems. Score between 8-15:  moderate risk of alcohol related problems. Score between 16-19:  high risk of alcohol related problems. Score 20 or above:  warrants further diagnostic evaluation for alcohol dependence and treatment.   CLINICAL FACTORS:  52 year old male , married, has a surviving adult daughter and a son who passed away 4 years ago. Self employed . He presented due to worsening depression, suicidal ideations, with thoughts of cutting self . Denies hallucinations.  He reports he has been drinking 80 ounces of beer per day, and has been using crack cocaine 4-5 times per week.  History of substance abuse, identifies alcohol and cocaine as substances of choice. States he was sober for several months, relapsed late last year, which he attributes to depression related to anniversary of son's death.    History of depression , history of prior psychiatric admissions, most recently 6/18 for depression and substance abuse . Denies history of suicide attempts, denies history of psychosis. He has been diagnosed with MDD and with Substance Induced Mood Disorder in the past . In the past has been managed with Zoloft , he has not been taking it recently, but states that it was helpful .  History of DM.   Dx- MDD versus Substance Induced Mood Disorder. Alcohol Use Disorder, Cocaine Use Disorder   Plan - Inpatient admission.  Zoloft 25 mgrs QDAY for depression, will titrate further if well tolerated Ativan PRNs for possible ETOH WDL symptoms if needed       Musculoskeletal: Strength & Muscle Tone: within normal limits-  No tremors, no diaphoresis, no restlessness or agitation Gait & Station: normal Patient leans: N/A  Psychiatric Specialty Exam:  Physical Exam  ROS denies headache, denies visual disturbances, denies chest pain, no shortness of breath, no nausea , no  vomiting , no diarrhea, no fever  Blood pressure (!) 126/97, pulse (!) 110, temperature 98.1 F (36.7 C), resp. rate 18, height 5\' 7"  (1.702 m), weight 92.1 kg (203 lb).Body mass index is 31.79 kg/m.  General Appearance: Fairly Groomed  Eye Contact:  Good  Speech:  Normal Rate  Volume:  Decreased  Mood:  reports he is feeling less depressed, " better "  Affect:  constricted, but does smile briefly at times  Thought Process:  Linear and Descriptions of Associations: Intact  Orientation:  Full (Time, Place, and Person)  Thought Content:  denies hallucinations, no delusions, not internally preoccupied   Suicidal Thoughts:  No denies any current suicidal or self injurious ideations, denies homicidal ideations, contracts for safety on unit   Homicidal Thoughts:  No  Memory:  recent and remote grossly intact   Judgement:  Fair  Insight:  Fair  Psychomotor Activity:  Normal- no current tremors or psychomotor agitation   Concentration:  Concentration: Good and Attention Span: Good  Recall:  Good  Fund of Knowledge:  Good  Language:  Good  Akathisia:  Negative  Handed:  Right  AIMS (if indicated):     Assets:  Communication Skills Desire for Improvement Resilience  ADL's:  Intact  Cognition:  WNL  Sleep:  Number of Hours: 6.5      COGNITIVE FEATURES THAT CONTRIBUTE TO RISK:  Closed-mindedness and Loss of executive function    SUICIDE RISK:   Moderate:  Frequent suicidal ideation with limited intensity, and duration, some specificity in terms of plans, no associated intent, good self-control, limited dysphoria/symptomatology, some risk factors present, and identifiable protective factors, including available and accessible social support.  PLAN OF CARE: Patient will be admitted to inpatient psychiatric unit for stabilization and safety. Will provide and encourage milieu participation. Provide medication management and maked adjustments as needed. Will also provide medication  management to minimize risk of alcohol WDL.   Will follow daily.    I certify that inpatient services furnished can reasonably be expected to improve the patient's condition.   Jenne Campus, MD 03/16/2017, 5:07 PM

## 2017-03-17 LAB — GLUCOSE, CAPILLARY
GLUCOSE-CAPILLARY: 241 mg/dL — AB (ref 65–99)
GLUCOSE-CAPILLARY: 112 mg/dL — AB (ref 65–99)
Glucose-Capillary: 242 mg/dL — ABNORMAL HIGH (ref 65–99)
Glucose-Capillary: 203 mg/dL — ABNORMAL HIGH (ref 65–99)

## 2017-03-17 MED ORDER — NALTREXONE HCL 50 MG PO TABS
25.0000 mg | ORAL_TABLET | Freq: Every day | ORAL | Status: DC
  Administered 2017-03-18 – 2017-03-20 (×3): 25 mg via ORAL
  Filled 2017-03-17 (×4): qty 1

## 2017-03-17 MED ORDER — SERTRALINE HCL 50 MG PO TABS
50.0000 mg | ORAL_TABLET | Freq: Every day | ORAL | Status: DC
  Administered 2017-03-18 – 2017-03-20 (×3): 50 mg via ORAL
  Filled 2017-03-17 (×4): qty 1
  Filled 2017-03-17: qty 7

## 2017-03-17 NOTE — Progress Notes (Signed)
D: Patient is pleasant and bright.  He denies any depressive symptoms or thoughts of self harm.  His goal is to work on his "health and sobriety."  He plans on attending meeting to maintain his sobriety.  Patient is sleeping and eating well; his energy level is normal and his concentration is good.  Patient has a good sense of humor and is interacting well with his peers.  Patient states he is not always compliant with his insulin.  Educated patient on the importance of taking his insulin for high blood sugars.  A: Continue to monitor medication management and MD orders.  Safety checks completed every 15 minutes per protocol.  Offer support and encouragement as needed.  R: Patient is receptive to staff; his behavior is appropriate.

## 2017-03-17 NOTE — Progress Notes (Signed)
D    Pt is pleasant on approach   He is compliant with treatment and interacts well with staff and peers   Pt endorses depression and some anxiety    Pt requested the same combination of medications to help him sleep tonight since he got good sleep last night A   Verbal support given    Medications administered and effectiveness monitored   Q 15 min checks R   Pt is safe at present time

## 2017-03-17 NOTE — Progress Notes (Signed)
Inpatient Diabetes Program Recommendations  AACE/ADA: New Consensus Statement on Inpatient Glycemic Control (2015)  Target Ranges:  Prepandial:   less than 140 mg/dL      Peak postprandial:   less than 180 mg/dL (1-2 hours)      Critically ill patients:  140 - 180 mg/dL   Lab Results  Component Value Date   GLUCAP 242 (H) 03/17/2017   HGBA1C 13.7 (H) 03/16/2017   Review of Glycemic Control  Diabetes history: DM 2 Outpatient Diabetes medications: 70/30 7 units qam, 12 units qpm, Metformin 500 mg BID Current orders for Inpatient glycemic control: Lantus 12 units QHS, Novolog Resistant Correction 0-20 units tid + Novolog HS scale 0-5 units, Novolog 4 units tid meal coverage, Metformin 1000 mg BID  Inpatient Diabetes Program Recommendations:    Fasting glucose 242 mg/dl this am. Please consider increasing Lantus to 16 units.  Thanks,  Tama Headings RN, MSN, [facility] Inpatient Diabetes Coordinator Team Pager [phone] (8a-5p)

## 2017-03-17 NOTE — Progress Notes (Signed)
Adult Psychoeducational Group Note  Date:  03/17/2017 Time:  7:28 PM  Group Topic/Focus:  Relapse Prevention Planning:   The focus of this group is to define relapse and discuss the need for planning to combat relapse.  Participation Level:  Minimal  Participation Quality:  Appropriate  Affect:  Appropriate  Cognitive:  Appropriate  Insight: Improving  Engagement in Group:  Developing/Improving  Modes of Intervention:  Discussion, Education and Problem-solving  Additional Comments:  Quiet much of group, but actively listening, nodding.    Michaelle Birks 03/17/2017, 7:28 PM

## 2017-03-17 NOTE — BHH Suicide Risk Assessment (Signed)
[facility] INPATIENT:  Family/Significant Other Suicide Prevention Education  Suicide Prevention Education:  Education Completed; Edie Darley (pt's wife) [phone] has been identified by the patient as the family member/significant other with whom the patient will be residing, and identified as the person(s) who will aid the patient in the event of a mental health crisis (suicidal ideations/suicide attempt).  With written consent from the patient, the family member/significant other has been provided the following suicide prevention education, prior to the and/or following the discharge of the patient.  The suicide prevention education provided includes the following:  Suicide risk factors  Suicide prevention and interventions  National Suicide Hotline telephone number  [facility] assessment telephone number  [facility] Emergency Assistance [place] and/or Residential Mobile Crisis Unit telephone number  Request made of family/significant other to:  Remove weapons (e.g., guns, rifles, knives), all items previously/currently identified as safety concern.    Remove drugs/medications (over-the-counter, prescriptions, illicit drugs), all items previously/currently identified as a safety concern.  The family member/significant other verbalizes understanding of the suicide prevention education information provided.  The family member/significant other agrees to remove the items of safety concern listed above.  Pt's wife has no safety concerns regarding pt discharging on Monday and was provided with SPE and aftercare plan. She asked that he get lined with PCP as well. Pt denies that pt has access to guns/weapons.   Dhyan Noah N Smart LCSW 03/17/2017, 11:26 AM

## 2017-03-17 NOTE — Progress Notes (Signed)
[facility] Notes:  (Nursing/MHT/Case Management/Adjunct)  Date: 03/16/2017  Time:  2045 Type of Therapy:  wrap up group  Participation Level:  Active  Participation Quality:  Appropriate, Attentive, Sharing and Supportive  Affect:  Appropriate  Cognitive:  Appropriate  Insight:  Good  Engagement in Group:  Engaged  Modes of Intervention:  Clarification, Education and Support  Summary of Progress/Problems:  Jonathan Austin 03/17/2017, 12:29 AM

## 2017-03-17 NOTE — Progress Notes (Signed)
Recreation Therapy Notes  Date: 03/17/17 Time: 0930 Location: [address] Hall Dayroom  Group Topic: Stress Management  Goal Area(s) Addresses:  Patient will verbalize importance of using healthy stress management.  Patient will identify positive emotions associated with healthy stress management.   Behavioral Response: Engaged  Intervention: Stress Management  Activity :  Progressive Muscle Relaxation.  LRT introduced the stress management technique of progressive muscle relaxation.  Patients were to follow along as LRT read script to tense each muscle group and then relax them separately.   Education:  Stress Management, Discharge Planning.   Education Outcome: Acknowledges edcuation/In group clarification offered/Needs additional education  Clinical Observations/Feedback: Pt attended group.     Jonathan Austin, LRT/CTRS         Ria Comment, Jonathan Austin 03/17/2017 11:59 AM

## 2017-03-17 NOTE — BHH Group Notes (Signed)
[facility] Group Notes:  Nursing  Date:  03/17/2017  Time:  1000 am  Type of Therapy:  Aromatherapy Group  Participation Level:  Active  Participation Quality:  Appropriate and Attentive  Affect:  Appropriate  Cognitive:  Alert and Appropriate  Insight:  Appropriate  Engagement in Group:  Engaged  Modes of Intervention:  Support  Summary of Progress/Problems: Patient appeared engaged and interested in the topic.  Zipporah Plants 03/17/2017, 1:21 PM

## 2017-03-17 NOTE — Progress Notes (Addendum)
[facility] MD Progress Note  03/17/2017 4:18 PM Jonathan Austin  MRN:  [id number] Subjective:  Patient reports he is feeling better. He  Denies suicidal ideations and presents future oriented. Denies medication side effects. Objective : I have discussed case with treatment team and have met with patient. 52 year old male, history of depression and grief symptoms related to death of son 4 years ago, who also has history of alcohol and cocaine abuse.  Currently presents calm, in no acute distress or discomfort, no distal tremors noted today, denies headache or visual disturbances .  Patient does endorse alcohol cravings and states that in the past he was recommended Naltrexone, which he did not take, but he is interested in considering this medication option now . Denies medication side effects. Denies suicidal ideations Visible in day room, pleasant on approach. Principal Problem: Severe recurrent major depression without psychotic features (HCC) Diagnosis:   Patient Active Problem List   Diagnosis Date Noted  . Polysubstance dependence including opioid drug with daily use (HCC) [F19.20] 03/16/2017  . MDD (major depressive disorder), severe (HCC) [F32.2] 03/15/2017  . Cocaine use disorder, severe, dependence (HCC) [F14.20] 07/19/2016  . Major depressive disorder, recurrent episode (HCC) [F33.9] 07/16/2016  . Alcohol use disorder, severe, dependence (HCC) [F10.20] 07/16/2016  . Thoracic aortic atherosclerosis (HCC) [I70.0] 05/09/2016  . Influenza B [J10.1] 05/09/2016  . Dehydration [E86.0]   . Lactic acidosis [E87.2]   . Severe recurrent major depression without psychotic features (HCC) [F33.2] 02/21/2016  . AKI (acute kidney injury) (HCC) [N17.9] 01/20/2016  . HTN (hypertension) [I10] 09/20/2015  . HLD (hyperlipidemia) [E78.5] 09/20/2015  . Substance abuse (HCC) [F19.10] 08/30/2014  . Substance induced mood disorder (HCC) [F19.94] 05/07/2014  . Cocaine abuse with cocaine-induced mood disorder  (HCC) [F14.14]   . Alcohol abuse [F10.10]   . Sinus tachycardia [R00.0] 02/12/2014  . Marijuana abuse [F12.10] 06/28/2013  . Diabetes mellitus type II, uncontrolled (HCC) [E11.65] 12/09/2012   Total Time spent with patient: 15 minutes   Past Medical History:  Past Medical History:  Diagnosis Date  . Cellulitis 06/28/2013  . Cocaine abuse (HCC) 06/28/2013  . Cocaine abuse with cocaine-induced mood disorder (HCC)   . Depression   . Diabetes mellitus   . High cholesterol   . Hypertension   . Scrotal infection 06/28/2013  . Sleep apnea   . Suicidal ideations     Past Surgical History:  Procedure Laterality Date  . TONSILLECTOMY     Family History:  Family History  Problem Relation Age of Onset  . Hypertension Mother   . Hypertension Father   . Diabetes Father    Social History:  Social History   Substance and Sexual Activity  Alcohol Use Yes   Comment: drinks beer 40 oz x 2 daily     Social History   Substance and Sexual Activity  Drug Use Yes  . Types: Cocaine, Marijuana   Comment: smokes 2g crack daily     Social History   Socioeconomic History  . Marital status: Married    Spouse name: None  . Number of children: None  . Years of education: None  . Highest education level: None  Social Needs  . Financial resource strain: None  . Food insecurity - worry: None  . Food insecurity - inability: None  . Transportation needs - medical: None  . Transportation needs - non-medical: None  Occupational History  . None  Tobacco Use  . Smoking status: Never Smoker  . Smokeless tobacco: Never   Used  Substance and Sexual Activity  . Alcohol use: Yes    Comment: drinks beer 40 oz x 2 daily  . Drug use: Yes    Types: Cocaine, Marijuana    Comment: smokes 2g crack daily   . Sexual activity: Yes  Other Topics Concern  . None  Social History Narrative  . None   Additional Social History:   Sleep: Fair  Appetite:  Good  Current Medications: Current  Facility-Administered Medications  Medication Dose Route Frequency Provider Last Rate Last Dose  . acetaminophen (TYLENOL) tablet 650 mg  650 mg Oral Q6H PRN Rankin, Shuvon B, NP      . alum & mag hydroxide-simeth (MAALOX/MYLANTA) 200-200-20 MG/5ML suspension 30 mL  30 mL Oral Q4H PRN Rankin, Shuvon B, NP      . amLODipine (NORVASC) tablet 5 mg  5 mg Oral Daily Rankin, Shuvon B, NP   5 mg at 03/17/17 0826  . aspirin EC tablet 81 mg  81 mg Oral Daily Rankin, Shuvon B, NP   81 mg at 03/17/17 0826  . dicyclomine (BENTYL) tablet 20 mg  20 mg Oral Q6H PRN Rankin, Shuvon B, NP      . hydrOXYzine (ATARAX/VISTARIL) tablet 25 mg  25 mg Oral Q6H PRN Simon, Spencer E, PA-C      . insulin aspart (novoLOG) injection 0-20 Units  0-20 Units Subcutaneous TID WC Simon, Spencer E, PA-C   7 Units at 03/17/17 1206  . insulin aspart (novoLOG) injection 0-5 Units  0-5 Units Subcutaneous QHS Simon, Spencer E, PA-C      . insulin aspart (novoLOG) injection 4 Units  4 Units Subcutaneous TID WC Simon, Spencer E, PA-C   4 Units at 03/17/17 1206  . insulin glargine (LANTUS) injection 12 Units  12 Units Subcutaneous QHS Simon, Spencer E, PA-C   12 Units at 03/16/17 2122  . loperamide (IMODIUM) capsule 2-4 mg  2-4 mg Oral PRN Simon, Spencer E, PA-C      . LORazepam (ATIVAN) tablet 1 mg  1 mg Oral Q6H PRN Simon, Spencer E, PA-C      . magnesium hydroxide (MILK OF MAGNESIA) suspension 30 mL  30 mL Oral Daily PRN Rankin, Shuvon B, NP      . metFORMIN (GLUCOPHAGE) tablet 1,000 mg  1,000 mg Oral BID WC Simon, Spencer E, PA-C   1,000 mg at 03/17/17 0825  . methocarbamol (ROBAXIN) tablet 500 mg  500 mg Oral Q8H PRN Rankin, Shuvon B, NP      . multivitamin with minerals tablet 1 tablet  1 tablet Oral Daily Simon, Spencer E, PA-C   1 tablet at 03/17/17 0825  . naproxen (NAPROSYN) tablet 500 mg  500 mg Oral BID PRN Rankin, Shuvon B, NP      . ondansetron (ZOFRAN-ODT) disintegrating tablet 4 mg  4 mg Oral Q6H PRN Simon, Spencer E, PA-C       . sertraline (ZOLOFT) tablet 25 mg  25 mg Oral Daily Rankin, Shuvon B, NP   25 mg at 03/17/17 0826  . simvastatin (ZOCOR) tablet 20 mg  20 mg Oral Daily Rankin, Shuvon B, NP   20 mg at 03/17/17 0826  . thiamine (B-1) injection 100 mg  100 mg Intramuscular Once Simon, Spencer E, PA-C      . thiamine (VITAMIN B-1) tablet 100 mg  100 mg Oral Daily Simon, Spencer E, PA-C   100 mg at 03/17/17 0825    Lab Results:  Results for orders placed or   performed during the hospital encounter of 03/15/17 (from the past 48 hour(s))  Glucose, capillary     Status: Abnormal   Collection Time: 03/15/17  8:55 PM  Result Value Ref Range   Glucose-Capillary 410 (H) 65 - 99 mg/dL   Comment 1 Notify RN   Glucose, capillary     Status: Abnormal   Collection Time: 03/16/17  1:33 AM  Result Value Ref Range   Glucose-Capillary 185 (H) 65 - 99 mg/dL  Glucose, capillary     Status: Abnormal   Collection Time: 03/16/17  6:12 AM  Result Value Ref Range   Glucose-Capillary 199 (H) 65 - 99 mg/dL   Comment 1 Notify RN   Hemoglobin A1c     Status: Abnormal   Collection Time: 03/16/17  7:35 AM  Result Value Ref Range   Hgb A1c MFr Bld 13.7 (H) 4.8 - 5.6 %    Comment: (NOTE) Pre diabetes:          5.7%-6.4% Diabetes:              >6.4% Glycemic control for   <7.0% adults with diabetes    Mean Plasma Glucose 346.49 mg/dL    Comment: Performed at [facility], [address] [place], [place] [address]  Glucose, capillary     Status: Abnormal   Collection Time: 03/16/17 12:09 PM  Result Value Ref Range   Glucose-Capillary 264 (H) 65 - 99 mg/dL  Glucose, capillary     Status: Abnormal   Collection Time: 03/16/17  5:25 PM  Result Value Ref Range   Glucose-Capillary 168 (H) 65 - 99 mg/dL  Glucose, capillary     Status: Abnormal   Collection Time: 03/16/17  8:55 PM  Result Value Ref Range   Glucose-Capillary 168 (H) 65 - 99 mg/dL   Comment 1 Notify RN    Comment 2 Document in Chart   Glucose, capillary      Status: Abnormal   Collection Time: 03/17/17  5:39 AM  Result Value Ref Range   Glucose-Capillary 242 (H) 65 - 99 mg/dL  Glucose, capillary     Status: Abnormal   Collection Time: 03/17/17 12:03 PM  Result Value Ref Range   Glucose-Capillary 203 (H) 65 - 99 mg/dL    Blood Alcohol level:  Lab Results  Component Value Date   ETH <10 03/14/2017   ETH <5 08/30/2016    Metabolic Disorder Labs: Lab Results  Component Value Date   HGBA1C 13.7 (H) 03/16/2017   MPG 346.49 03/16/2017   MPG 298 02/23/2016   No results found for: PROLACTIN Lab Results  Component Value Date   CHOL 265 (H) 08/31/2014   TRIG 351 (H) 08/31/2014   HDL 49 08/31/2014   CHOLHDL 5.4 08/31/2014   VLDL 70 (H) 08/31/2014   LDLCALC 146 (H) 08/31/2014   LDLCALC 163 (H) 05/07/2014    Physical Findings: AIMS: Facial and Oral Movements Muscles of Facial Expression: None, normal Lips and Perioral Area: None, normal Jaw: None, normal Tongue: None, normal,Extremity Movements Upper (arms, wrists, hands, fingers): None, normal Lower (legs, knees, ankles, toes): None, normal, Trunk Movements Neck, shoulders, hips: None, normal, Overall Severity Severity of abnormal movements (highest score from questions above): None, normal Incapacitation due to abnormal movements: None, normal Patient's awareness of abnormal movements (rate only patient's report): No Awareness, Dental Status Current problems with teeth and/or dentures?: No Does patient usually wear dentures?: No  CIWA:  CIWA-Ar Total: 1 COWS:     Musculoskeletal:   Strength & Muscle Tone: within normal limits no tremors, no diaphoresis, no restlessness or agitation Gait & Station: normal Patient leans: N/A  Psychiatric Specialty Exam: Physical Exam  ROS denies headache, denies visual disturbances, no chest pain, no shortness of breath  Blood pressure 128/80, pulse 97, temperature (!) 97.5 F (36.4 C), temperature source Oral, resp. rate 20, height 5' 7"  (1.702 m), weight 92.1 kg (203 lb).Body mass index is 31.79 kg/m.  General Appearance: Fairly Groomed  Eye Contact:  Good  Speech:  Normal Rate  Volume:  Normal  Mood:  improved, states he is feeling better   Affect:  Appropriate and more reactive   Thought Process:  Linear and Descriptions of Associations: Intact  Orientation:  Full (Time, Place, and Person)  Thought Content:  no hallucinations, no delusions, not internally preoccupied   Suicidal Thoughts:  No  Denies suicidal or self injurious ideations, denies homicidal or violent ideations  Homicidal Thoughts:  No  Memory:  recent and remote grossly intact   Judgement:  Other:  improving   Insight:  improving   Psychomotor Activity:  Normal- no tremors or restlessness at this time  Concentration:  Concentration: Good and Attention Span: Good  Recall:  Good  Fund of Knowledge:  Good  Language:  Good  Akathisia:  Negative  Handed:  Right  AIMS (if indicated):     Assets:  Communication Skills Desire for Improvement Resilience Social Support  ADL's:  Intact  Cognition:  WNL  Sleep:  Number of Hours: 5.5   Assessment- patient reports improving mood, and presents with an improved range of affect. No suicidal ideations and is future oriented. Currently not presenting with significant symptoms of alcohol WDL. Tolerating Zoloft trial well . Also expresses interest in Naltrexone for help with alcohol use disorder - patient denies any opiate use or abuse, and UDS negative for opiates- we discussed side effects and opiate blocking properties .  Treatment Plan Summary: Daily contact with patient to assess and evaluate symptoms and progress in treatment, Medication management, Plan inpatient treatment  and medications as below Encourage group and milieu participation to work on coping skills and symptom reduction Encourage efforts to work on Radiographer, therapeutic and symptom reduction Continue Ativan PRNs for potential alcohol WDL symptoms if  needed  Increase Zoloft to 50 mgrs QDAY for depression Start Naltrexone 25  mgrs QDAY for alcohol dependence- ( patient denies opiate use, and admission UDS negative for opiates ) Treatment team working on disposition planning options Jenne Campus, MD 03/17/2017, 4:18 PM

## 2017-03-17 NOTE — Tx Team (Signed)
Interdisciplinary Treatment and Diagnostic Plan Update  03/17/2017 Time of Session: [id number] Jonathan Austin MRN: [id number]  Principal Diagnosis: Severe recurrent major depression without psychotic features [facility])  Secondary Diagnoses: Principal Problem:   Severe recurrent major depression without psychotic features ([place]) Active Problems:   Polysubstance dependence including opioid drug with daily use ([place])   Current Medications:  Current Facility-Administered Medications  Medication Dose Route Frequency Provider Last Rate Last Dose  . acetaminophen (TYLENOL) tablet 650 mg  650 mg Oral Q6H PRN Rankin, Shuvon B, NP      . alum & mag hydroxide-simeth (MAALOX/MYLANTA) 200-200-20 MG/5ML suspension 30 mL  30 mL Oral Q4H PRN Rankin, Shuvon B, NP      . amLODipine (NORVASC) tablet 5 mg  5 mg Oral Daily Rankin, Shuvon B, NP   5 mg at 03/17/17 0826  . aspirin EC tablet 81 mg  81 mg Oral Daily Rankin, Shuvon B, NP   81 mg at 03/17/17 0826  . dicyclomine (BENTYL) tablet 20 mg  20 mg Oral Q6H PRN Rankin, Shuvon B, NP      . hydrOXYzine (ATARAX/VISTARIL) tablet 25 mg  25 mg Oral Q6H PRN Patriciaann Clan E, PA-C      . insulin aspart (novoLOG) injection 0-20 Units  0-20 Units Subcutaneous TID WC Patriciaann Clan E, PA-C   7 Units at 03/17/17 [phone]  . insulin aspart (novoLOG) injection 0-5 Units  0-5 Units Subcutaneous QHS Simon, Spencer E, PA-C      . insulin aspart (novoLOG) injection 4 Units  4 Units Subcutaneous TID WC Patriciaann Clan E, PA-C   4 Units at 03/17/17 [phone]  . insulin glargine (LANTUS) injection 12 Units  12 Units Subcutaneous QHS Laverle Hobby, PA-C   12 Units at 03/16/17 2122  . loperamide (IMODIUM) capsule 2-4 mg  2-4 mg Oral PRN Laverle Hobby, PA-C      . LORazepam (ATIVAN) tablet 1 mg  1 mg Oral Q6H PRN Patriciaann Clan E, PA-C      . magnesium hydroxide (MILK OF MAGNESIA) suspension 30 mL  30 mL Oral Daily PRN Rankin, Shuvon B, NP      . metFORMIN (GLUCOPHAGE) tablet 1,000 mg  1,000 mg  Oral BID WC Patriciaann Clan E, PA-C   1,000 mg at 03/17/17 0825  . methocarbamol (ROBAXIN) tablet 500 mg  500 mg Oral Q8H PRN Rankin, Shuvon B, NP      . multivitamin with minerals tablet 1 tablet  1 tablet Oral Daily Laverle Hobby, PA-C   1 tablet at 03/17/17 0825  . naproxen (NAPROSYN) tablet 500 mg  500 mg Oral BID PRN Rankin, Shuvon B, NP      . ondansetron (ZOFRAN-ODT) disintegrating tablet 4 mg  4 mg Oral Q6H PRN Laverle Hobby, PA-C      . sertraline (ZOLOFT) tablet 25 mg  25 mg Oral Daily Rankin, Shuvon B, NP   25 mg at 03/17/17 0826  . simvastatin (ZOCOR) tablet 20 mg  20 mg Oral Daily Rankin, Shuvon B, NP   20 mg at 03/17/17 0826  . thiamine (B-1) injection 100 mg  100 mg Intramuscular Once Patriciaann Clan E, PA-C      . thiamine (VITAMIN B-1) tablet 100 mg  100 mg Oral Daily Laverle Hobby, PA-C   100 mg at 03/17/17 0825   PTA Medications: Medications Prior to Admission  Medication Sig Dispense Refill Last Dose  . acetaminophen (TYLENOL) 325 MG tablet Take 2 tablets (650 mg total) by  mouth every 6 (six) hours as needed for mild pain, moderate pain or fever. (Patient not taking: Reported on 03/15/2017)   Not Taking at Unknown time  . amLODipine (NORVASC) 5 MG tablet Take 1 tablet (5 mg total) by mouth daily. For high blood pressure (Patient not taking: Reported on 03/15/2017) 30 tablet 0 Not Taking at Unknown time  . aspirin EC 81 MG tablet Take 81 mg by mouth daily.   03/13/2017  . cholecalciferol (VITAMIN D) 1000 units tablet Take 1,000 Units by mouth daily.   03/13/2017  . hydrOXYzine (ATARAX/VISTARIL) 25 MG tablet Take 1 tablet (25 mg total) by mouth every 6 (six) hours as needed for anxiety. (Patient not taking: Reported on 03/15/2017) 60 tablet 0 Not Taking at Unknown time  . insulin aspart protamine- aspart (NOVOLOG MIX 70/30) (70-30) 100 UNIT/ML injection Inject 0.07-0.12 mLs (7-12 Units total) into the skin 2 (two) times daily with a meal. 7 units in the morning and 12 units in the  evening. 10 mL 2 03/13/2017  . metFORMIN (GLUCOPHAGE) 500 MG tablet Take 1 tablet (500 mg total) by mouth 2 (two) times daily with a meal. For diabetes management 60 tablet 0 03/13/2017  . sertraline (ZOLOFT) 25 MG tablet Take 1 tablet (25 mg total) by mouth daily. For depression 30 tablet 0 03/13/2017  . simvastatin (ZOCOR) 20 MG tablet Take 1 tablet (20 mg total) by mouth daily. For high cholesterol 30 tablet 0 03/13/2017  . traZODone (DESYREL) 50 MG tablet Take 1 tablet (50 mg total) by mouth at bedtime as needed for sleep. (Patient not taking: Reported on 03/15/2017) 30 tablet 0 Not Taking at Unknown time    Patient Stressors: Health problems Substance abuse  Patient Strengths: Capable of independent living Curator fund of knowledge Motivation for treatment/growth Supportive family/friends  Treatment Modalities: Medication Management, Group therapy, Case management,  1 to 1 session with clinician, Psychoeducation, Recreational therapy.   Physician Treatment Plan for Primary Diagnosis: Severe recurrent major depression without psychotic features ([place]) Long Term Goal(s): Improvement in symptoms so as ready for discharge Improvement in symptoms so as ready for discharge   Short Term Goals: Ability to identify changes in lifestyle to reduce recurrence of condition will improve Ability to verbalize feelings will improve Ability to disclose and discuss suicidal ideas Ability to demonstrate self-control will improve Ability to identify and develop effective coping behaviors will improve Compliance with prescribed medications will improve Ability to identify triggers associated with substance abuse/mental health issues will improve Ability to maintain clinical measurements within normal limits will improve Compliance with prescribed medications will improve  Medication Management: Evaluate patient's response, side effects, and tolerance of medication regimen.  Therapeutic  Interventions: 1 to 1 sessions, Unit Group sessions and Medication administration.  Evaluation of Outcomes: Progressing  Physician Treatment Plan for Secondary Diagnosis: Principal Problem:   Severe recurrent major depression without psychotic features ([place]) Active Problems:   Polysubstance dependence including opioid drug with daily use ([place])  Long Term Goal(s): Improvement in symptoms so as ready for discharge Improvement in symptoms so as ready for discharge   Short Term Goals: Ability to identify changes in lifestyle to reduce recurrence of condition will improve Ability to verbalize feelings will improve Ability to disclose and discuss suicidal ideas Ability to demonstrate self-control will improve Ability to identify and develop effective coping behaviors will improve Compliance with prescribed medications will improve Ability to identify triggers associated with substance abuse/mental health issues will improve Ability to maintain clinical  measurements within normal limits will improve Compliance with prescribed medications will improve     Medication Management: Evaluate patient's response, side effects, and tolerance of medication regimen.  Therapeutic Interventions: 1 to 1 sessions, Unit Group sessions and Medication administration.  Evaluation of Outcomes: Progressing   RN Treatment Plan for Primary Diagnosis: Severe recurrent major depression without psychotic features ([place]) Long Term Goal(s): Knowledge of disease and therapeutic regimen to maintain health will improve  Short Term Goals: Ability to remain free from injury will improve, Ability to disclose and discuss suicidal ideas and Ability to identify and develop effective coping behaviors will improve  Medication Management: RN will administer medications as ordered by provider, will assess and evaluate patient's response and provide education to patient for prescribed medication. RN will report any adverse and/or  side effects to prescribing provider.  Therapeutic Interventions: 1 on 1 counseling sessions, Psychoeducation, Medication administration, Evaluate responses to treatment, Monitor vital signs and CBGs as ordered, Perform/monitor CIWA, COWS, AIMS and Fall Risk screenings as ordered, Perform wound care treatments as ordered.  Evaluation of Outcomes: Progressing   LCSW Treatment Plan for Primary Diagnosis: Severe recurrent major depression without psychotic features ([place]) Long Term Goal(s): Safe transition to appropriate next level of care at discharge, Engage patient in therapeutic group addressing interpersonal concerns.  Short Term Goals: Engage patient in aftercare planning with referrals and resources, Facilitate patient progression through stages of change regarding substance use diagnoses and concerns and Identify triggers associated with mental health/substance abuse issues  Therapeutic Interventions: Assess for all discharge needs, 1 to 1 time with Social worker, Explore available resources and support systems, Assess for adequacy in community support network, Educate family and significant other(s) on suicide prevention, Complete Psychosocial Assessment, Interpersonal group therapy.  Evaluation of Outcomes: Progressing   Progress in Treatment: Attending groups: Yes. Participating in groups: Yes. Taking medication as prescribed: Yes. Toleration medication: Yes. Family/Significant other contact made: No, will contact:  pt's wife for collateral information/SPE. Patient understands diagnosis: Yes. Discussing patient identified problems/goals with staff: Yes. Medical problems stabilized or resolved: Yes. Denies suicidal/homicidal ideation: Yes. Issues/concerns per patient self-inventory: No. Other: n/a  New problem(s) identified: No, Describe:  n/a  New Short Term/Long Term Goal(s): detox, medication management for mood stabilization; elimination of SI thoughts; development of  comprehensive mental wellness/sobriety plan.   Patient Goal: "To detox and get back on my medication to feel better."   Discharge Plan or Barriers: CSW assessing. Pt plans to return home and would like to resume services at [facility] for group therapy/individual therapy. Pt would like to go there for medication management as well and states that he was told to walk in for medication management referral by his current provider at that agency. He was provided with [facility] pamphlet and AA/NA list for [facility] for additional community support.   Reason for Continuation of Hospitalization: Anxiety Depression Medication stabilization Withdrawal symptoms  Estimated Length of Stay: Monday, 03/20/17  Attendees: Patient: Jonathan Austin 03/17/2017 9:04 AM  Physician: Dr. Parke Poisson MD; Dr. Nancy Fetter MD 03/17/2017 9:04 AM  Nursing: Chrys Racer RN; [place] RN 03/17/2017 9:04 AM  RN Care Manager: Lars Pinks CM 03/17/2017 9:04 AM  Social Worker: Press photographer, LCSW 03/17/2017 9:04 AM  Recreational Therapist: x 03/17/2017 9:04 AM  Other: Lindell Spar NP; Ricky Ala NP 03/17/2017 9:04 AM  Other:  03/17/2017 9:04 AM  Other: 03/17/2017 9:04 AM    Scribe for Treatment Team: [place], LCSW 03/17/2017 9:04 AM

## 2017-03-17 NOTE — [facility] Group Notes (Signed)
LCSW Group Therapy Note 03/17/2017 3:27 PM  Type of Therapy and Topic: Group Therapy: Feelings around Relapse and Recovery  Participation Level: Active   Description of Group:  Patients in this group will discuss emotions they experience before and after a relapse. They will process how experiencing these feelings, or avoidance of experiencing them, relates to having a relapse. Facilitator will guide patients to explore emotions they have related to recovery. Patients will be encouraged to process which emotions are more powerful. They will be guided to discuss the emotional reaction significant others in their lives may have to their relapse or recovery. Patients will be assisted in exploring ways to respond to the emotions of others without this contributing to a relapse.  Therapeutic Goals: 1. Patient will identify two or more emotions that lead to a relapse for them 2. Patient will identify two emotions that result when they relapse 3. Patient will identify two emotions related to recovery 4. Patient will demonstrate ability to communicate their needs through discussion and/or role plays  Summary of Patient Progress:   Lovelace was engaged throughout the group session. Asahel participated and contributed to the group's discussion. Rustyn stated that at discharge he plans to  Follow up with his outpatient providers to prevent himself from relapsing.   Therapeutic Modalities:  Cognitive Behavioral Therapy Solution-Focused Therapy Assertiveness Training Relapse Prevention Therapy   Theresa Duty Clinical Social Worker

## 2017-03-18 DIAGNOSIS — F1490 Cocaine use, unspecified, uncomplicated: Secondary | ICD-10-CM

## 2017-03-18 DIAGNOSIS — F1020 Alcohol dependence, uncomplicated: Secondary | ICD-10-CM

## 2017-03-18 DIAGNOSIS — F1290 Cannabis use, unspecified, uncomplicated: Secondary | ICD-10-CM

## 2017-03-18 LAB — GLUCOSE, CAPILLARY
GLUCOSE-CAPILLARY: 248 mg/dL — AB (ref 65–99)
Glucose-Capillary: 181 mg/dL — ABNORMAL HIGH (ref 65–99)
Glucose-Capillary: 262 mg/dL — ABNORMAL HIGH (ref 65–99)
Glucose-Capillary: 108 mg/dL — ABNORMAL HIGH (ref 65–99)

## 2017-03-18 NOTE — Progress Notes (Signed)
D    Pt is pleasant on approach   He is compliant with treatment and interacts well with staff and peers   Pt endorses depression and some anxiety    He reports feeling better and has been laughing and joking   And socializing more    A   Verbal support given    Medications administered and effectiveness monitored   Q 15 min checks R   Pt is safe at present time

## 2017-03-18 NOTE — [facility] Group Notes (Signed)
[facility] Group Notes:  (Nursing/MHT/Case Management/Adjunct)  Date:  03/18/2017  Time:  4:46 PM  Type of Therapy:  Psychoeducational Skills  Participation Level:  Active  Participation Quality:  Appropriate  Affect:  Appropriate  Cognitive:  Appropriate  Insight:  Appropriate  Engagement in Group:  Engaged  Modes of Intervention:  Problem-solving  Summary of Progress/Problems: Pt attended Psychoeducational group with top topic anger management.   [name] 03/18/2017, 4:46 PM

## 2017-03-18 NOTE — Progress Notes (Signed)
[facility] MD Progress Note  03/18/2017 3:03 PM Harshaan Whang  MRN:  [id number]    Subjective:  Kayle reports " I am feeling alright, I have a plan to [facility]"    Objective: [name] is awake, alert and oriented X4 , found attending group session.  Denies suicidal or homicidal ideation. Denies auditory or visual hallucination and does not appear to be responding to internal stimuli. Patient interacts well with staff and others. Patient reports he medication compliant without mediation side effects. [place] denies depression or depressive symptoms during this assessment. Support, encouragement and reassurance was provided.    Principal Problem: Severe recurrent major depression without psychotic features ([place]) Diagnosis:   Patient Active Problem List   Diagnosis Date Noted  . Polysubstance dependence including opioid drug with daily use ([place]) [F19.20] 03/16/2017  . MDD (major depressive disorder), severe ([place]) [F32.2] 03/15/2017  . Cocaine use disorder, severe, dependence ([place]) [F14.20] 07/19/2016  . Major depressive disorder, recurrent episode ([place]) [F33.9] 07/16/2016  . Alcohol use disorder, severe, dependence ([place]) [F10.20] 07/16/2016  . Thoracic aortic atherosclerosis ([place]) [I70.0] 05/09/2016  . Influenza B [J10.1] 05/09/2016  . Dehydration [E86.0]   . Lactic acidosis [E87.2]   . Severe recurrent major depression without psychotic features ([place]) [F33.2] 02/21/2016  . AKI (acute kidney injury) ([place]) [N17.9] 01/20/2016  . HTN (hypertension) [I10] 09/20/2015  . HLD (hyperlipidemia) [E78.5] 09/20/2015  . Substance abuse ([place]) [F19.10] 08/30/2014  . Substance induced mood disorder ([place]) [F19.94] 05/07/2014  . Cocaine abuse with cocaine-induced mood disorder ([place]) [F14.14]   . Alcohol abuse [F10.10]   . Sinus tachycardia [R00.0] 02/12/2014  . Marijuana abuse [F12.10] 06/28/2013  . Diabetes mellitus type II, uncontrolled ([place]) [E11.65] 12/09/2012    Total Time spent with patient: 15 minutes   Past Medical History:  Past Medical History:  Diagnosis Date  . Cellulitis 06/28/2013  . Cocaine abuse ([place]) 06/28/2013  . Cocaine abuse with cocaine-induced mood disorder ([place])   . Depression   . Diabetes mellitus   . High cholesterol   . Hypertension   . Scrotal infection 06/28/2013  . Sleep apnea   . Suicidal ideations     Past Surgical History:  Procedure Laterality Date  . TONSILLECTOMY     Family History:  Family History  Problem Relation Age of Onset  . Hypertension Mother   . Hypertension Father   . Diabetes Father    Social History:  Social History   Substance and Sexual Activity  Alcohol Use Yes   Comment: drinks beer 40 oz x 2 daily     Social History   Substance and Sexual Activity  Drug Use Yes  . Types: Cocaine, Marijuana   Comment: smokes 2g crack daily     Social History   Socioeconomic History  . Marital status: Married    Spouse name: None  . Number of children: None  . Years of education: None  . Highest education level: None  Social Needs  . Financial resource strain: None  . Food insecurity - worry: None  . Food insecurity - inability: None  . Transportation needs - medical: None  . Transportation needs - non-medical: None  Occupational History  . None  Tobacco Use  . Smoking status: Never Smoker  . Smokeless tobacco: Never Used  Substance and Sexual Activity  . Alcohol use: Yes    Comment: drinks beer 40 oz x 2 daily  . Drug use: Yes    Types: Cocaine, Marijuana  Comment: smokes 2g crack daily   . Sexual activity: Yes  Other Topics Concern  . None  Social History Narrative  . None   Additional Social History:   Sleep: Fair  Appetite:  Good  Current Medications: Current Facility-Administered Medications  Medication Dose Route Frequency Provider Last Rate Last Dose  . acetaminophen (TYLENOL) tablet 650 mg  650 mg Oral Q6H PRN Rankin, Shuvon B, NP      . alum & mag  hydroxide-simeth (MAALOX/MYLANTA) 200-200-20 MG/5ML suspension 30 mL  30 mL Oral Q4H PRN Rankin, Shuvon B, NP      . amLODipine (NORVASC) tablet 5 mg  5 mg Oral Daily Rankin, Shuvon B, NP   5 mg at 03/18/17 0834  . aspirin EC tablet 81 mg  81 mg Oral Daily Rankin, Shuvon B, NP   81 mg at 03/18/17 0835  . dicyclomine (BENTYL) tablet 20 mg  20 mg Oral Q6H PRN Rankin, Shuvon B, NP      . hydrOXYzine (ATARAX/VISTARIL) tablet 25 mg  25 mg Oral Q6H PRN Patriciaann Clan E, PA-C      . insulin aspart (novoLOG) injection 0-20 Units  0-20 Units Subcutaneous TID WC Patriciaann Clan E, PA-C   7 Units at 03/18/17 1157  . insulin aspart (novoLOG) injection 0-5 Units  0-5 Units Subcutaneous QHS Simon, Spencer E, PA-C      . insulin aspart (novoLOG) injection 4 Units  4 Units Subcutaneous TID WC Patriciaann Clan E, PA-C   4 Units at 03/18/17 1157  . insulin glargine (LANTUS) injection 12 Units  12 Units Subcutaneous QHS Laverle Hobby, PA-C   12 Units at 03/17/17 2135  . loperamide (IMODIUM) capsule 2-4 mg  2-4 mg Oral PRN Laverle Hobby, PA-C      . LORazepam (ATIVAN) tablet 1 mg  1 mg Oral Q6H PRN Patriciaann Clan E, PA-C      . magnesium hydroxide (MILK OF MAGNESIA) suspension 30 mL  30 mL Oral Daily PRN Rankin, Shuvon B, NP      . metFORMIN (GLUCOPHAGE) tablet 1,000 mg  1,000 mg Oral BID WC Patriciaann Clan E, PA-C   1,000 mg at 03/18/17 0834  . methocarbamol (ROBAXIN) tablet 500 mg  500 mg Oral Q8H PRN Rankin, Shuvon B, NP      . multivitamin with minerals tablet 1 tablet  1 tablet Oral Daily Laverle Hobby, PA-C   1 tablet at 03/18/17 5638  . naltrexone (DEPADE) tablet 25 mg  25 mg Oral Daily Cheney Gosch, Myer Peer, MD   25 mg at 03/18/17 0835  . naproxen (NAPROSYN) tablet 500 mg  500 mg Oral BID PRN Rankin, Shuvon B, NP      . ondansetron (ZOFRAN-ODT) disintegrating tablet 4 mg  4 mg Oral Q6H PRN Laverle Hobby, PA-C      . sertraline (ZOLOFT) tablet 50 mg  50 mg Oral Daily Cesareo Vickrey, Myer Peer, MD   50 mg at  03/18/17 0834  . simvastatin (ZOCOR) tablet 20 mg  20 mg Oral Daily Rankin, Shuvon B, NP   20 mg at 03/18/17 0834  . thiamine (B-1) injection 100 mg  100 mg Intramuscular Once Patriciaann Clan E, PA-C      . thiamine (VITAMIN B-1) tablet 100 mg  100 mg Oral Daily Laverle Hobby, PA-C   100 mg at 03/18/17 7564    Lab Results:  Results for orders placed or performed during the hospital encounter of 03/15/17 (from the past 48 hour(s))  Glucose, capillary     Status: Abnormal   Collection Time: 03/16/17  5:25 PM  Result Value Ref Range   Glucose-Capillary 168 (H) 65 - 99 mg/dL  Glucose, capillary     Status: Abnormal   Collection Time: 03/16/17  8:55 PM  Result Value Ref Range   Glucose-Capillary 168 (H) 65 - 99 mg/dL   Comment 1 Notify RN    Comment 2 Document in Chart   Glucose, capillary     Status: Abnormal   Collection Time: 03/17/17  5:39 AM  Result Value Ref Range   Glucose-Capillary 242 (H) 65 - 99 mg/dL  Glucose, capillary     Status: Abnormal   Collection Time: 03/17/17 12:03 PM  Result Value Ref Range   Glucose-Capillary 203 (H) 65 - 99 mg/dL  Glucose, capillary     Status: Abnormal   Collection Time: 03/17/17  5:24 PM  Result Value Ref Range   Glucose-Capillary 241 (H) 65 - 99 mg/dL  Glucose, capillary     Status: Abnormal   Collection Time: 03/17/17  9:12 PM  Result Value Ref Range   Glucose-Capillary 112 (H) 65 - 99 mg/dL  Glucose, capillary     Status: Abnormal   Collection Time: 03/18/17  6:32 AM  Result Value Ref Range   Glucose-Capillary 181 (H) 65 - 99 mg/dL  Glucose, capillary     Status: Abnormal   Collection Time: 03/18/17 11:52 AM  Result Value Ref Range   Glucose-Capillary 248 (H) 65 - 99 mg/dL    Blood Alcohol level:  Lab Results  Component Value Date   ETH <10 03/14/2017   ETH <5 61/95/0932    Metabolic Disorder Labs: Lab Results  Component Value Date   HGBA1C 13.7 (H) 03/16/2017   MPG 346.49 03/16/2017   MPG 298 02/23/2016   No results  found for: PROLACTIN Lab Results  Component Value Date   CHOL 265 (H) 08/31/2014   TRIG 351 (H) 08/31/2014   HDL 49 08/31/2014   CHOLHDL 5.4 08/31/2014   VLDL 70 (H) 08/31/2014   LDLCALC 146 (H) 08/31/2014   LDLCALC 163 (H) 05/07/2014    Physical Findings: AIMS: Facial and Oral Movements Muscles of Facial Expression: None, normal Lips and Perioral Area: None, normal Jaw: None, normal Tongue: None, normal,Extremity Movements Upper (arms, wrists, hands, fingers): None, normal Lower (legs, knees, ankles, toes): None, normal, Trunk Movements Neck, shoulders, hips: None, normal, Overall Severity Severity of abnormal movements (highest score from questions above): None, normal Incapacitation due to abnormal movements: None, normal Patient's awareness of abnormal movements (rate only patient's report): No Awareness, Dental Status Current problems with teeth and/or dentures?: No Does patient usually wear dentures?: No  CIWA:  CIWA-Ar Total: 1 COWS:     Musculoskeletal: Strength & Muscle Tone: within normal limits no tremors, no diaphoresis, no restlessness or agitation Gait & Station: normal Patient leans: N/A  Psychiatric Specialty Exam: Physical Exam  Vitals reviewed. Constitutional: He appears well-developed.  Cardiovascular: Normal rate.  Psychiatric: He has a normal mood and affect.    ROS  Blood pressure 124/77, pulse 78, temperature 98.2 F (36.8 C), temperature source Oral, resp. rate 20, height 5\' 7"  (1.702 m), weight 92.1 kg (203 lb).Body mass index is 31.79 kg/m.  General Appearance: Fairly Groomed  Eye Contact:  Good  Speech:  Normal Rate  Volume:  Normal  Mood:  improved, states he is feeling better   Affect:  Appropriate and more reactive   Thought Process:  Linear and  Descriptions of Associations: Intact  Orientation:  Full (Time, Place, and Person)  Thought Content:  no hallucinations, no delusions, not internally preoccupied   Suicidal Thoughts:  No     Homicidal Thoughts:  No  Memory:  recent and remote grossly intact   Judgement:  Other:  improving   Insight:  improving   Psychomotor Activity:  Normal  Concentration:  Concentration: Good and Attention Span: Good  Recall:  Good  Fund of Knowledge:  Good  Language:  Good  Akathisia:  Negative  Handed:  Right  AIMS (if indicated):     Assets:  Communication Skills Desire for Improvement Resilience Social Support  ADL's:  Intact  Cognition:  WNL  Sleep:  Number of Hours: 6.75    Treatment Plan Summary: Daily contact with patient to assess and evaluate symptoms and progress in treatment and Medication management   Continue with current treatment plan on 03/18/2017 except noted    Continue Ativan PRNs for potential alcohol WDL symptoms if needed  Continue Zoloft to 50 mgrs QDAY for depression Continue  Naltrexone 25  mgrs QDAY for alcohol dependence- ( patient denies opiate use, and admission UDS negative for opiates )  Treatment team working on disposition planning optionsEncourage group and milieu participation to work on Radiographer, therapeutic and symptom reduction Encourage efforts to work on Radiographer, therapeutic and symptom reduction   [name], NP 03/18/2017, 3:03 PM Agree with NP Progress Note

## 2017-03-18 NOTE — [facility] Group Notes (Signed)
[facility] Group Notes:  (Nursing/MHT/Case Management/Adjunct)  Date:  03/18/2017  Time:  4:40 PM  Type of Therapy:  Goals/Orientation Group.  Participation Level:  Active  Participation Quality:  Appropriate  Affect:  Appropriate  Cognitive:  Appropriate  Insight:  Appropriate  Engagement in Group:  Engaged  Modes of Intervention:  Discussion  Summary of Progress/Problems: Pt attended goals/orientation group, pt was receptive.   Jonathan Austin Shanta 03/18/2017, 4:40 PM

## 2017-03-18 NOTE — Progress Notes (Signed)
Patient ID: Jonathan Austin, male   DOB: Aug 23, 1965, 52 y.o.   MRN: [id number]    D: Pt has been appropriate today on the unit today, he attended all groups and engaged in treatment. Pt reported that his depression was a 0, his hopelessness was a 0, and his anxiety was a 0. Pt reported that his goal for today was to get health on track. Pt took all medication without any problems. Pt reported being negative SI/HI, no AH/VH noted. A: 15 min checks continued for patient safety. R: Pt safety maintained.

## 2017-03-18 NOTE — [facility] Group Notes (Signed)
LCSW Group Therapy Note  03/18/2017 9:30-10:30AM - [address], 10:30-11:30 - [address], 11:30-12:00 - 500 [address]  Type of Therapy and Topic:  Group Therapy: Anger Cues and Responses  Participation Level:  Active   Description of Group:  In this group, patients learned how to recognize the physical, cognitive, emotional, and behavioral responses they have to anger-provoking situations.  They identified a recent time they became angry and how they reacted.  They analyzed how their reaction was possibly beneficial and how it was possibly unhelpful.  The group discussed a variety of healthier coping skills that could help with such a situation in the future.  Deep breathing was practiced briefly.  Therapeutic Goals: 1. Patients will remember their last incident of anger and how they felt emotionally and physically, what their thoughts were at the time, and how they behaved. 2. Patients will identify how their behavior at that time worked for them, as well as how it worked against them. 3. Patients will explore possible new behaviors to use in future anger situations. 4. Patients will learn that anger itself is normal and cannot be eliminated, and that healthier reactions can assist with resolving conflict rather than worsening situations.  Summary of Patient Progress:  The patient shared that their most recent time of anger was on Thursday and did not share details.  He listened attentively throughout group but did not share.  Therapeutic Modalities:   Cognitive Behavioral Therapy  Maretta Los  03/18/2017 8:32 AM

## 2017-03-19 LAB — GLUCOSE, CAPILLARY
GLUCOSE-CAPILLARY: 167 mg/dL — AB (ref 65–99)
GLUCOSE-CAPILLARY: 211 mg/dL — AB (ref 65–99)
Glucose-Capillary: 208 mg/dL — ABNORMAL HIGH (ref 65–99)
Glucose-Capillary: 133 mg/dL — ABNORMAL HIGH (ref 65–99)

## 2017-03-19 NOTE — [facility] Group Notes (Signed)
[place] Group Notes:  (Nursing/MHT/Case Management/Adjunct)  Date:  03/19/2017  Time:  1:42 PM  Type of Therapy:  Psychoeducational Skills  Participation Level:  Active  Participation Quality:  Appropriate  Affect:  Appropriate  Cognitive:  Appropriate  Insight:  Appropriate  Engagement in Group:  Engaged  Modes of Intervention:  Problem-solving  Summary of Progress/Problems: Pt attended Psychoeducational group with top topic healthy support systems.   [name] 03/19/2017, 1:42 PM

## 2017-03-19 NOTE — Progress Notes (Signed)
D    Pt is pleasant on approach   He is compliant with treatment and interacts well with staff and peers   Pt endorses depression and some anxiety    He reports feeling better and has been laughing and joking   And socializing more    A   Verbal support given    Medications administered and effectiveness monitored   Q 15 min checks R   Pt is safe at present time

## 2017-03-19 NOTE — Progress Notes (Signed)
[facility] MD Progress Note  03/19/2017 10:56 AM Jonathan Austin  MRN:  [id number]    Subjective:  Can reports " I am ready to leave today, I have thing that I need to get done sooner than later." reports he has to follow-up with probation officer.   Objective: Jonathan Austin is awake, alert and oriented.  Patient is eager to be discharged states he has planes to keep is follow-up appointments  and will continue medications at discharge.  Continues to deny suicidal or homicidal ideation. Denies auditory or visual hallucination and does not appear to be responding to internal stimuli.   Noted patient reports his vehicle is at [facility]  Jonathan Austin continues to deny depression or depressive symptoms.  Support, encouragement and reassurance was provided.    Principal Problem: Severe recurrent major depression without psychotic features ([place]) Diagnosis:   Patient Active Problem List   Diagnosis Date Noted  . Polysubstance dependence including opioid drug with daily use ([place]) [F19.20] 03/16/2017  . MDD (major depressive disorder), severe ([place]) [F32.2] 03/15/2017  . Cocaine use disorder, severe, dependence ([place]) [F14.20] 07/19/2016  . Major depressive disorder, recurrent episode ([place]) [F33.9] 07/16/2016  . Alcohol use disorder, severe, dependence ([place]) [F10.20] 07/16/2016  . Thoracic aortic atherosclerosis ([place]) [I70.0] 05/09/2016  . Influenza B [J10.1] 05/09/2016  . Dehydration [E86.0]   . Lactic acidosis [E87.2]   . Severe recurrent major depression without psychotic features ([place]) [F33.2] 02/21/2016  . AKI (acute kidney injury) ([place]) [N17.9] 01/20/2016  . HTN (hypertension) [I10] 09/20/2015  . HLD (hyperlipidemia) [E78.5] 09/20/2015  . Substance abuse ([place]) [F19.10] 08/30/2014  . Substance induced mood disorder ([place]) [F19.94] 05/07/2014  . Cocaine abuse with cocaine-induced mood disorder ([place]) [F14.14]   . Alcohol abuse [F10.10]   . Sinus tachycardia [R00.0] 02/12/2014  . Marijuana abuse  [F12.10] 06/28/2013  . Diabetes mellitus type II, uncontrolled ([place]) [E11.65] 12/09/2012   Total Time spent with patient: 15 minutes   Past Medical History:  Past Medical History:  Diagnosis Date  . Cellulitis 06/28/2013  . Cocaine abuse ([place]) 06/28/2013  . Cocaine abuse with cocaine-induced mood disorder ([place])   . Depression   . Diabetes mellitus   . High cholesterol   . Hypertension   . Scrotal infection 06/28/2013  . Sleep apnea   . Suicidal ideations     Past Surgical History:  Procedure Laterality Date  . TONSILLECTOMY     Family History:  Family History  Problem Relation Age of Onset  . Hypertension Mother   . Hypertension Father   . Diabetes Father    Social History:  Social History   Substance and Sexual Activity  Alcohol Use Yes   Comment: drinks beer 40 oz x 2 daily     Social History   Substance and Sexual Activity  Drug Use Yes  . Types: Cocaine, Marijuana   Comment: smokes 2g crack daily     Social History   Socioeconomic History  . Marital status: Married    Spouse name: None  . Number of children: None  . Years of education: None  . Highest education level: None  Social Needs  . Financial resource strain: None  . Food insecurity - worry: None  . Food insecurity - inability: None  . Transportation needs - medical: None  . Transportation needs - non-medical: None  Occupational History  . None  Tobacco Use  . Smoking status: Never Smoker  . Smokeless tobacco: Never Used  Substance and Sexual Activity  . Alcohol  use: Yes    Comment: drinks beer 40 oz x 2 daily  . Drug use: Yes    Types: Cocaine, Marijuana    Comment: smokes 2g crack daily   . Sexual activity: Yes  Other Topics Concern  . None  Social History Narrative  . None   Additional Social History:   Sleep: Fair  Appetite:  Good  Current Medications: Current Facility-Administered Medications  Medication Dose Route Frequency Provider Last Rate Last Dose  . acetaminophen  (TYLENOL) tablet 650 mg  650 mg Oral Q6H PRN Rankin, Shuvon B, NP      . alum & mag hydroxide-simeth (MAALOX/MYLANTA) 200-200-20 MG/5ML suspension 30 mL  30 mL Oral Q4H PRN Rankin, Shuvon B, NP      . amLODipine (NORVASC) tablet 5 mg  5 mg Oral Daily Rankin, Shuvon B, NP   5 mg at 03/19/17 0807  . aspirin EC tablet 81 mg  81 mg Oral Daily Rankin, Shuvon B, NP   81 mg at 03/19/17 0807  . dicyclomine (BENTYL) tablet 20 mg  20 mg Oral Q6H PRN Rankin, Shuvon B, NP      . insulin aspart (novoLOG) injection 0-20 Units  0-20 Units Subcutaneous TID WC Patriciaann Clan E, PA-C   4 Units at 03/19/17 [phone]  . insulin aspart (novoLOG) injection 0-5 Units  0-5 Units Subcutaneous QHS Simon, Spencer E, PA-C      . insulin aspart (novoLOG) injection 4 Units  4 Units Subcutaneous TID WC Patriciaann Clan E, PA-C   4 Units at 03/19/17 [phone]  . insulin glargine (LANTUS) injection 12 Units  12 Units Subcutaneous QHS Laverle Hobby, PA-C   12 Units at 03/18/17 2139  . magnesium hydroxide (MILK OF MAGNESIA) suspension 30 mL  30 mL Oral Daily PRN Rankin, Shuvon B, NP      . metFORMIN (GLUCOPHAGE) tablet 1,000 mg  1,000 mg Oral BID WC Patriciaann Clan E, PA-C   1,000 mg at 03/19/17 0807  . methocarbamol (ROBAXIN) tablet 500 mg  500 mg Oral Q8H PRN Rankin, Shuvon B, NP      . multivitamin with minerals tablet 1 tablet  1 tablet Oral Daily Laverle Hobby, PA-C   1 tablet at 03/19/17 4665  . naltrexone (DEPADE) tablet 25 mg  25 mg Oral Daily Vane Yapp, Myer Peer, MD   25 mg at 03/19/17 0807  . naproxen (NAPROSYN) tablet 500 mg  500 mg Oral BID PRN Rankin, Shuvon B, NP      . sertraline (ZOLOFT) tablet 50 mg  50 mg Oral Daily Kamiryn Bezanson, Myer Peer, MD   50 mg at 03/19/17 0808  . simvastatin (ZOCOR) tablet 20 mg  20 mg Oral Daily Rankin, Shuvon B, NP   20 mg at 03/19/17 0807  . thiamine (B-1) injection 100 mg  100 mg Intramuscular Once Patriciaann Clan E, PA-C      . thiamine (VITAMIN B-1) tablet 100 mg  100 mg Oral Daily Laverle Hobby,  PA-C   100 mg at 03/19/17 0807    Lab Results:  Results for orders placed or performed during the hospital encounter of 03/15/17 (from the past 48 hour(s))  Glucose, capillary     Status: Abnormal   Collection Time: 03/17/17 12:03 PM  Result Value Ref Range   Glucose-Capillary 203 (H) 65 - 99 mg/dL  Glucose, capillary     Status: Abnormal   Collection Time: 03/17/17  5:24 PM  Result Value Ref Range   Glucose-Capillary 241 (H) 65 -  99 mg/dL  Glucose, capillary     Status: Abnormal   Collection Time: 03/17/17  9:12 PM  Result Value Ref Range   Glucose-Capillary 112 (H) 65 - 99 mg/dL  Glucose, capillary     Status: Abnormal   Collection Time: 03/18/17  6:32 AM  Result Value Ref Range   Glucose-Capillary 181 (H) 65 - 99 mg/dL  Glucose, capillary     Status: Abnormal   Collection Time: 03/18/17 11:52 AM  Result Value Ref Range   Glucose-Capillary 248 (H) 65 - 99 mg/dL  Glucose, capillary     Status: Abnormal   Collection Time: 03/18/17  5:16 PM  Result Value Ref Range   Glucose-Capillary 262 (H) 65 - 99 mg/dL  Glucose, capillary     Status: Abnormal   Collection Time: 03/18/17  9:03 PM  Result Value Ref Range   Glucose-Capillary 108 (H) 65 - 99 mg/dL  Glucose, capillary     Status: Abnormal   Collection Time: 03/19/17  6:45 AM  Result Value Ref Range   Glucose-Capillary 167 (H) 65 - 99 mg/dL    Blood Alcohol level:  Lab Results  Component Value Date   ETH <10 03/14/2017   ETH <5 09/38/1829    Metabolic Disorder Labs: Lab Results  Component Value Date   HGBA1C 13.7 (H) 03/16/2017   MPG 346.49 03/16/2017   MPG 298 02/23/2016   No results found for: PROLACTIN Lab Results  Component Value Date   CHOL 265 (H) 08/31/2014   TRIG 351 (H) 08/31/2014   HDL 49 08/31/2014   CHOLHDL 5.4 08/31/2014   VLDL 70 (H) 08/31/2014   LDLCALC 146 (H) 08/31/2014   LDLCALC 163 (H) 05/07/2014    Physical Findings: AIMS: Facial and Oral Movements Muscles of Facial Expression: None,  normal Lips and Perioral Area: None, normal Jaw: None, normal Tongue: None, normal,Extremity Movements Upper (arms, wrists, hands, fingers): None, normal Lower (legs, knees, ankles, toes): None, normal, Trunk Movements Neck, shoulders, hips: None, normal, Overall Severity Severity of abnormal movements (highest score from questions above): None, normal Incapacitation due to abnormal movements: None, normal Patient's awareness of abnormal movements (rate only patient's report): No Awareness, Dental Status Current problems with teeth and/or dentures?: No Does patient usually wear dentures?: No  CIWA:  CIWA-Ar Total: 1 COWS:     Musculoskeletal: Strength & Muscle Tone: within normal limits no tremors, no diaphoresis, no restlessness or agitation Gait & Station: normal Patient leans: N/A  Psychiatric Specialty Exam: Physical Exam  Vitals reviewed. Constitutional: He appears well-developed.  Cardiovascular: Normal rate.  Psychiatric: He has a normal mood and affect. His speech is normal and behavior is normal. Judgment and thought content normal. His affect is not blunt, not labile and not inappropriate. Cognition and memory are normal. He does not exhibit a depressed mood.    ROS  Blood pressure 120/86, pulse (!) 107, temperature 97.7 F (36.5 C), temperature source Oral, resp. rate 20, height 5\' 7"  (1.702 m), weight 92.1 kg (203 lb).Body mass index is 31.79 kg/m.  General Appearance: Fairly Groomed  Eye Contact:  Good  Speech:  Normal Rate  Volume:  Normal  Mood:  Anxious reports his mood has improved overall since his admission   Affect:  Appropriate and more reactive   Thought Process:  Linear  Orientation:  Full (Time, Place, and Person)  Thought Content:  Hallucinations: None  Suicidal Thoughts:  No    Homicidal Thoughts:  No  Memory:  recent and remote grossly intact  Judgement:  Other:  improving   Insight:  improving   Psychomotor Activity:  Normal  Concentration:   Concentration: Good and Attention Span: Good  Recall:  Good  Fund of Knowledge:  Good  Language:  Good  Akathisia:  Negative  Handed:  Right  AIMS (if indicated):     Assets:  Communication Skills Desire for Improvement Resilience Social Support  ADL's:  Intact  Cognition:  WNL  Sleep:  Number of Hours: 6.75    Treatment Plan Summary: Daily contact with patient to assess and evaluate symptoms and progress in treatment and Medication management   Continue with current treatment plan on 03/19/2017 except noted   Continue Ativan PRNs for potential alcohol WDL symptoms if needed  Continue Zoloft to 50 mgrs QDAY for depression Continue  Naltrexone 25  mgrs QDAY for alcohol dependence- ( patient denies opiate use, and admission UDS negative for opiates )  Treatment team working on disposition planning options  -Encourage group and milieu participation to work on Radiographer, therapeutic and symptom reduction  -Encourage efforts to work on Radiographer, therapeutic and symptom reduction   [name], NP 03/19/2017, 10:56 AM Agree with NP Progress Note

## 2017-03-19 NOTE — [facility] Group Notes (Signed)
[facility] LCSW Group Therapy Note  Date/Time:  03/19/2017 10:00-11:00AM  Type of Therapy and Topic:  Group Therapy:  Healthy and Unhealthy Supports  Participation Level:  Active   Description of Group:  Patients in this group were introduced to the idea of adding a variety of healthy supports to address the various needs in their lives.Patients discussed what additional healthy supports could be helpful in their recovery and wellness after discharge in order to prevent future hospitalizations.   An emphasis was placed on using counselor, doctor, therapy groups, 12-step groups, and problem-specific support groups to expand supports.  They also worked as a group on developing a specific plan for several patients to deal with unhealthy supports through [place], psychoeducation with loved ones, and even termination of relationships.   Therapeutic Goals:   1)  discuss importance of adding supports to stay well once out of the hospital  2)  compare healthy versus unhealthy supports and identify some examples of each  3)  generate ideas and descriptions of healthy supports that can be added  4)  offer mutual support about how to address unhealthy supports  5)  encourage active participation in and adherence to discharge plan    Summary of Patient Progress:  The patient expressed a willingness to add a therapist at [facility], returning to [facility] for medications, and returning to Tuesday/Thursday night groups and relying on his church members to help in his recovery journey.  He has blocked telephone numbers of some of his contacts who contribute to his addiction, but admitted he is still holding on to a couple of numbers that he knows he needs to get rid of.   Therapeutic Modalities:   Motivational Interviewing Brief Solution-Focused Therapy  Selmer Dominion, LCSW

## 2017-03-19 NOTE — BHH Group Notes (Signed)
[facility] Group Notes:  (Nursing/MHT/Case Management/Adjunct)  Date:  03/19/2017  Time:  11:53 AM  Type of Therapy:  Goals/Orientation Group.  Participation Level:  Active  Participation Quality:  Appropriate  Affect:  Appropriate  Cognitive:  Appropriate  Insight:  Appropriate  Engagement in Group:  Engaged  Modes of Intervention:  Discussion  Summary of Progress/Problems: Pt attended goals/orientation group, pt was receptive.   [name] 03/19/2017, 11:53 AM

## 2017-03-19 NOTE — Progress Notes (Signed)
Patient ID: Jonathan Austin, male   DOB: 1965-06-16, 52 y.o.   MRN: [id number]    D:Pt has been appropriate today on the unit today, he attended all groups and engaged in treatment. Pt reported that his depression was a 0, his hopelessness was a 0, and his anxiety was a 0. Pt reported that his goal for today was to get focused.Pt took all medication without any problems. Pt reported being negative SI/HI, no AH/VH noted. A: 15 min checks continued for patient safety. R: Pt safety maintained.

## 2017-03-20 LAB — GLUCOSE, CAPILLARY: Glucose-Capillary: 141 mg/dL — ABNORMAL HIGH (ref 65–99)

## 2017-03-20 MED ORDER — NALTREXONE HCL 50 MG PO TABS: 25.0000 mg | ORAL_TABLET | Freq: Every day | ORAL | 0 refills | Status: DC

## 2017-03-20 MED ORDER — INSULIN GLARGINE 100 UNIT/ML ~~LOC~~ SOLN: 12.0000 [IU] | Freq: Every day | SUBCUTANEOUS | 11 refills | Status: DC

## 2017-03-20 MED ORDER — NALTREXONE HCL 50 MG PO TABS: 50.0000 mg | ORAL_TABLET | Freq: Every day | ORAL | 0 refills | Status: DC

## 2017-03-20 MED ORDER — NALTREXONE HCL 50 MG PO TABS
50.0000 mg | ORAL_TABLET | Freq: Every day | ORAL | Status: DC
  Filled 2017-03-20: qty 7
  Filled 2017-03-20: qty 1

## 2017-03-20 MED ORDER — SERTRALINE HCL 50 MG PO TABS: 50.0000 mg | ORAL_TABLET | Freq: Every day | ORAL | 0 refills | Status: DC

## 2017-03-20 MED ORDER — METFORMIN HCL 1000 MG PO TABS: 1000.0000 mg | ORAL_TABLET | Freq: Two times a day (BID) | ORAL | 0 refills | Status: DC

## 2017-03-20 NOTE — Progress Notes (Signed)
  [facility] Adult Case Management Discharge Plan :  Will you be returning to the same living situation after discharge:  Yes,  home At discharge, do you have transportation home?: Yes,  home with wife Do you have the ability to pay for your medications: Yes,  mental health  Release of information consent forms completed and submitted to medical records by CSW.  Patient to Follow up at: Follow-up Information    [facility] Follow up.   Specialty:  Catering manager information: [place] [id number] [phone]           Next level of care provider has access to [place] and Suicide Prevention discussed: Yes,  SPE completed with pt's wife; SPI pamphlet and Mobile Crisis informatin provided.  Have you used any form of tobacco in the last 30 days? (Cigarettes, Smokeless Tobacco, Cigars, and/or Pipes): No  Has patient been referred to the Quitline?: N/A patient is not a smoker  Patient has been referred for addiction treatment: Yes  [employer], LCSW 03/20/2017, 10:12 AM

## 2017-03-20 NOTE — BHH Group Notes (Signed)
[facility] Group Notes:  (Nursing/MHT/Case Management/Adjunct)  Date:  03/20/2017  Time:  4:00 p.m.  Type of Therapy:  Psychoeducational Skills  Participation Level:  Active  Participation Quality:  Appropriate  Affect:  Appropriate  Cognitive:  Appropriate  Insight:  Appropriate  Engagement in Group:  Supportive  Modes of Intervention:  Socialization  Summary of Progress/Problems:  Patient contributed to discussion appropriately.  Cammy Copa 03/20/2017, 5:55 PM

## 2017-03-20 NOTE — Progress Notes (Signed)
Patient ID: Jonathan Austin, male   DOB: 1966/01/12, 53 y.o.   MRN: [id number] Patient educated on his discharge instructions and verbalized understanding.  Pt given all of his belongings, his discharge instructions, his prescription scripts, and medication samples.  Pt left [facility] with all of above and transportation was provided by security who took him to [facility] where he will be able to pick up his vehicle and drive home.

## 2017-03-20 NOTE — Progress Notes (Signed)
Patient ID: Jonathan Austin, male   DOB: 10-Mar-1965, 52 y.o.   MRN: [id number] D: Patient reports that his sleep quality last night was good, reports a good appetite, reports that his energy level is good, reports his concentration level as good, and denies being depressed, denies feeling hopeless or anxious.  Pt also denies SI/HI/AVH, states that his goal is "to follow up on my home plan and make all appointments", and reports he intends to "be on time and meet all appointments".  Pt adds "thanks for the comfort and love you all have shown. That lets me know I can make it".  Pt denies any other concerns, and has been visible in the milieu this morning attending groups and interacting with peers.  Pt's discharged orders have been placed as well.  A: All meds are being given as scheduled, pt has been educated on discharge plan, and has verbalized understanding.  Q15 minute safety checks in place.  R: Pt denies any current concerns.

## 2017-03-20 NOTE — [facility] Suicide Risk Assessment (Signed)
[facility] Discharge Suicide Risk Assessment   Principal Problem: Severe recurrent major depression without psychotic features [facility]) Discharge Diagnoses:  Patient Active Problem List   Diagnosis Date Noted  . Polysubstance dependence including opioid drug with daily use ([place]) [F19.20] 03/16/2017  . MDD (major depressive disorder), severe ([place]) [F32.2] 03/15/2017  . Cocaine use disorder, severe, dependence ([place]) [F14.20] 07/19/2016  . Major depressive disorder, recurrent episode ([place]) [F33.9] 07/16/2016  . Alcohol use disorder, severe, dependence ([place]) [F10.20] 07/16/2016  . Thoracic aortic atherosclerosis ([place]) [I70.0] 05/09/2016  . Influenza B [J10.1] 05/09/2016  . Dehydration [E86.0]   . Lactic acidosis [E87.2]   . Severe recurrent major depression without psychotic features ([place]) [F33.2] 02/21/2016  . AKI (acute kidney injury) ([place]) [N17.9] 01/20/2016  . HTN (hypertension) [I10] 09/20/2015  . HLD (hyperlipidemia) [E78.5] 09/20/2015  . Substance abuse ([place]) [F19.10] 08/30/2014  . Substance induced mood disorder ([place]) [F19.94] 05/07/2014  . Cocaine abuse with cocaine-induced mood disorder ([place]) [F14.14]   . Alcohol abuse [F10.10]   . Sinus tachycardia [R00.0] 02/12/2014  . Marijuana abuse [F12.10] 06/28/2013  . Diabetes mellitus type II, uncontrolled ([place]) [E11.65] 12/09/2012    Total Time spent with patient: 30 minutes  Musculoskeletal: Strength & Muscle Tone: within normal limits Gait & Station: normal Patient leans: N/A  Psychiatric Specialty Exam: ROS no headache, no chest pain, no vomiting, no diarrhea, no fever, no chills, no rash  Blood pressure 140/84, pulse 85, temperature (!) 97.5 F (36.4 C), temperature source Oral, resp. rate 20, height 5\' 7"  (1.702 m), weight 92.1 kg (203 lb).Body mass index is 31.79 kg/m.  General Appearance: Well Groomed  Eye Contact::  Good  Speech:  Normal Rate409  Volume:  Normal  Mood:  reports improved mood, denies feeling depressed at this  time  Affect:  Appropriate and fuller in range   Thought Process:  Linear and Descriptions of Associations: Intact  Orientation:  Other:  fully alert and attentive   Thought Content:  no hallucinations, no delusions, not internally preoccupied   Suicidal Thoughts:  No denies any suicidal or self injurious ideations, denies any homicidal or violent ideations  Homicidal Thoughts:  No  Memory:  recent and remote grossly intact  Judgement:  Other:  improving   Insight:  improving   Psychomotor Activity:  Normal  Concentration:  Good  Recall:  Good  Fund of Knowledge:Good  Language: Good  Akathisia:  Negative  Handed:  Right  AIMS (if indicated):     Assets:  Communication Skills Desire for Improvement Resilience  Sleep:  Number of Hours: 6.25  Cognition: WNL  ADL's:  Intact   Mental Status Per Nursing Assessment::   On Admission:  NA  Demographic Factors:  52 year old male, married, has one surviving daughter, son passed away 4 years ago, step son passed away 3 years ago. Self employed   Loss Factors: Loss of loved ones , relapse   Historical Factors: History of depression , no history of suicide attempts, history of prior psychiatric admissions, history of alcohol, cocaine use disorders   Risk Reduction Factors:   Employed, Living with another person, especially a relative, Positive social support and Positive coping skills or problem solving skills  Continued Clinical Symptoms:  At this time patient reports he is feeling "OK", denies depression, affect present reactive, appropriate. No thought disorder, no suicidal or self injurious ideations, no homicidal or violent ideations, no psychotic symptoms, future oriented. Patient visible on unit, interactive, behavior in good control, pleasant on approach.  No current WDL symptoms, calm, in no acute distress or discomfort, vitals stable. Denies medication side effects. Side effects discussed, including opiate blocking properties  of Naltrexone , which he has tolerated well thus far- will increase dose to 50 mgrs daily.  Cognitive Features That Contribute To Risk:  No gross cognitive deficits noted upon discharge. Is alert , attentive, and oriented x 3   Suicide Risk:  Mild:  Suicidal ideation of limited frequency, intensity, duration, and specificity.  There are no identifiable plans, no associated intent, mild dysphoria and related symptoms, good self-control (both objective and subjective assessment), few other risk factors, and identifiable protective factors, including available and accessible social support.  Follow-up Information    [facility] Follow up.   Specialty:  Catering manager information: [place] [id number] [phone]           Plan Of Care/Follow-up recommendations:  Activity:  as tolerated  Diet:  diabetic diet  Tests:  NA Other:  see below  Patient is expressing readiness for discharge, he is leaving unit in good spirits  Plans to return home.  Plans to follow up as above. He states he also plans to go to [employer] regularly. States that he follows up at [facility] for his DM management as well .   Jenne Campus, MD 03/20/2017, 11:18 AM

## 2017-03-20 NOTE — Discharge Summary (Addendum)
Physician Discharge Summary Note  Patient:  Jonathan Austin is an 52 y.o., male MRN:  [id number] DOB:  [birth year] Patient phone:  [phone] (home)  Patient address:   [address] [place] [address],  Total Time spent with patient: 30 minutes  Date of Admission:  03/15/2017 Date of Discharge: 03/20/2017  Reason for Admission: Per assessment note- Jonathan Austin, 52 y.o., male patient admitted to [facility] after presenting to [facility] with complaints of suicidal thoughts and plan to cut his wrist.  Also reported daily use of crack cocaine and alcohol.  Patient seen face to face by this provider, chart reviewed, discussed with Dr. Parke Poisson and treatment team on 03/16/17.  On evaluation Jonathan Austin reports that he has been having worsening of depression and suicidal thoughts.  Patient states that his son and stepson died 3 years ago several months apart and around the holidays every year his depression worsens.  "MY son was killed in December and then in 05-07-2022 my stepson died; so every year around Thanksgiving until late May 07, 2022; I get depressed."  Patient states that he is also having financial stressor.  Patient also states that he has been using alcohol and cocaine daily for the last two months.  "I was doing it about once or twice a week until about the last two months when I started using both everyday."  Patient states that he has not has suicidal thoughts since he has been admitted to hospital.  "I just want to get my life together and get back on my medication."  Patient reports that he was taking Zoloft, Trazodone, and Vistaril which worked for him and has outpatient services with [facility] [place].  At this time patient denies suicidal ideation but continues to think "I'm just tired of things; tired of going through the same hard day; day in and day out.  I would be better off dead.  Patient reports that he lives with his wife who is supportive   Principal Problem: Severe  recurrent major depression without psychotic features [facility]) Discharge Diagnoses: Patient Active Problem List   Diagnosis Date Noted  . Polysubstance dependence including opioid drug with daily use ([place]) [F19.20] 03/16/2017  . MDD (major depressive disorder), severe ([place]) [F32.2] 03/15/2017  . Cocaine use disorder, severe, dependence ([place]) [F14.20] 07/19/2016  . Major depressive disorder, recurrent episode ([place]) [F33.9] 07/16/2016  . Alcohol use disorder, severe, dependence ([place]) [F10.20] 07/16/2016  . Thoracic aortic atherosclerosis ([place]) [I70.0] 05/09/2016  . Influenza B [J10.1] 05/09/2016  . Dehydration [E86.0]   . Lactic acidosis [E87.2]   . Severe recurrent major depression without psychotic features ([place]) [F33.2] 02/21/2016  . AKI (acute kidney injury) ([place]) [N17.9] 01/20/2016  . HTN (hypertension) [I10] 09/20/2015  . HLD (hyperlipidemia) [E78.5] 09/20/2015  . Substance abuse ([place]) [F19.10] 08/30/2014  . Substance induced mood disorder ([place]) [F19.94] 05/07/2014  . Cocaine abuse with cocaine-induced mood disorder ([place]) [F14.14]   . Alcohol abuse [F10.10]   . Sinus tachycardia [R00.0] 02/12/2014  . Marijuana abuse [F12.10] 06/28/2013  . Diabetes mellitus type II, uncontrolled ([place]) [E11.65] 12/09/2012    Past Psychiatric History:   Past Medical History:  Past Medical History:  Diagnosis Date  . Cellulitis 06/28/2013  . Cocaine abuse ([place]) 06/28/2013  . Cocaine abuse with cocaine-induced mood disorder ([place])   . Depression   . Diabetes mellitus   . High cholesterol   . Hypertension   . Scrotal infection 06/28/2013  . Sleep apnea   . Suicidal ideations  Past Surgical History:  Procedure Laterality Date  . TONSILLECTOMY     Family History:  Family History  Problem Relation Age of Onset  . Hypertension Mother   . Hypertension Father   . Diabetes Father    Family Psychiatric  History:  Social History:  Social History   Substance and Sexual Activity  Alcohol Use  Yes   Comment: drinks beer 40 oz x 2 daily     Social History   Substance and Sexual Activity  Drug Use Yes  . Types: Cocaine, Marijuana   Comment: smokes 2g crack daily     Social History   Socioeconomic History  . Marital status: Married    Spouse name: None  . Number of children: None  . Years of education: None  . Highest education level: None  Social Needs  . Financial resource strain: None  . Food insecurity - worry: None  . Food insecurity - inability: None  . Transportation needs - medical: None  . Transportation needs - non-medical: None  Occupational History  . None  Tobacco Use  . Smoking status: Never Smoker  . Smokeless tobacco: Never Used  Substance and Sexual Activity  . Alcohol use: Yes    Comment: drinks beer 40 oz x 2 daily  . Drug use: Yes    Types: Cocaine, Marijuana    Comment: smokes 2g crack daily   . Sexual activity: Yes  Other Topics Concern  . None  Social History Narrative  . None    Hospital Course:  Jonathan Austin was admitted for Severe recurrent major depression without psychotic features ([place]) and crisis management.  Pt was treated discharged with the medications listed below under Medication List.  Medical problems were identified and treated as needed.  Home medications were restarted as appropriate.  Improvement was monitored by observation and Jonathan Austin 's daily report of symptom reduction.  Emotional and mental status was monitored by daily self-inventory reports completed by Jonathan Austin and clinical staff.         Jonathan Austin was evaluated by the treatment team for stability and plans for continued recovery upon discharge. Jonathan Austin 's motivation was an integral factor for scheduling further treatment. Employment, transportation, bed availability, health status, family support, and any pending legal issues were also considered during hospital stay. Pt was offered further treatment options upon discharge including but  not limited to Residential, Intensive Outpatient, and Outpatient treatment.  Jonathan Austin will follow up with the services as listed below under Follow Up Information.     Upon completion of this admission the patient was both mentally and medically stable for discharge denying suicidal homicidal ideation, auditory or visua hallucinations, delusional thoughts and paranoia.     Jonathan Austin responded well to treatment with Zoloft 50 mg and Depade 50 mg without adverse effects.  Pt demonstrated improvement without reported or observed adverse effects to the point of stability appropriate for outpatient management. Pertinent labs include: CMP and UDS  for which outpatient follow-up is necessary for lab recheck as mentioned below. Reviewed CBC, CMP, BAL, and UDS+ cocaine  all unremarkable aside from noted exceptions.   Physical Findings: AIMS: Facial and Oral Movements Muscles of Facial Expression: None, normal Lips and Perioral Area: None, normal Jaw: None, normal Tongue: None, normal,Extremity Movements Upper (arms, wrists, hands, fingers): None, normal Lower (legs, knees, ankles, toes): None, normal, Trunk Movements Neck, shoulders, hips: None, normal, Overall Severity Severity of abnormal movements (highest score from questions above): None, normal  Incapacitation due to abnormal movements: None, normal Patient's awareness of abnormal movements (rate only patient's report): No Awareness, Dental Status Current problems with teeth and/or dentures?: No Does patient usually wear dentures?: No  CIWA:  CIWA-Ar Total: 1 COWS:     Musculoskeletal: Strength & Muscle Tone: within normal limits Gait & Station: normal Patient leans: N/A  Psychiatric Specialty Exam: See SRA by MD Physical Exam  Vitals reviewed. Constitutional: He appears well-developed.  Cardiovascular: Normal rate.  Neurological: He is alert.  Psychiatric: He has a normal mood and affect. His behavior is normal.    Review  of Systems  Psychiatric/Behavioral: Positive for substance abuse. Negative for depression (stable), hallucinations (stable) and suicidal ideas.    Blood pressure 140/84, pulse 85, temperature (!) 97.5 F (36.4 C), temperature source Oral, resp. rate 20, height 5\' 7"  (1.702 m), weight 92.1 kg (203 lb).Body mass index is 31.79 kg/m.   Have you used any form of tobacco in the last 30 days? (Cigarettes, Smokeless Tobacco, Cigars, and/or Pipes): No  Has this patient used any form of tobacco in the last 30 days? (Cigarettes, Smokeless Tobacco, Cigars, and/or Pipes) , No  Blood Alcohol level:  Lab Results  Component Value Date   ETH <10 03/14/2017   ETH <5 38/11/1749    Metabolic Disorder Labs:  Lab Results  Component Value Date   HGBA1C 13.7 (H) 03/16/2017   MPG 346.49 03/16/2017   MPG 298 02/23/2016   No results found for: PROLACTIN Lab Results  Component Value Date   CHOL 265 (H) 08/31/2014   TRIG 351 (H) 08/31/2014   HDL 49 08/31/2014   CHOLHDL 5.4 08/31/2014   VLDL 70 (H) 08/31/2014   LDLCALC 146 (H) 08/31/2014   LDLCALC 163 (H) 05/07/2014    See Psychiatric Specialty Exam and Suicide Risk Assessment completed by Attending Physician prior to discharge.  Discharge destination:  Home  Is patient on multiple antipsychotic therapies at discharge:  No   Has Patient had three or more failed trials of antipsychotic monotherapy by history:  No  Recommended Plan for Multiple Antipsychotic Therapies: NA  Discharge Instructions    Diet - low sodium heart healthy   Complete by:  As directed    Discharge instructions   Complete by:  As directed    Take all medications as prescribed. Keep all follow-up appointments as scheduled.  Do not consume alcohol or use illegal drugs while on prescription medications. Report any adverse effects from your medications to your primary care provider promptly.  In the event of recurrent symptoms or worsening symptoms, call 911, a crisis  hotline, or go to the nearest emergency department for evaluation.   Increase activity slowly   Complete by:  As directed      Allergies as of 03/20/2017   No Known Allergies     Medication List    STOP taking these medications   acetaminophen 325 MG tablet Commonly known as:  TYLENOL   cholecalciferol 1000 units tablet Commonly known as:  VITAMIN D   hydrOXYzine 25 MG tablet Commonly known as:  ATARAX/VISTARIL   insulin aspart protamine- aspart (70-30) 100 UNIT/ML injection Commonly known as:  NOVOLOG MIX 70/30   traZODone 50 MG tablet Commonly known as:  DESYREL     TAKE these medications     Indication  amLODipine 5 MG tablet Commonly known as:  NORVASC Take 1 tablet (5 mg total) by mouth daily. For high blood pressure  Indication:  High Blood Pressure Disorder  aspirin EC 81 MG tablet Take 81 mg by mouth daily.  Indication:  Acute Heart Attack   insulin glargine 100 UNIT/ML injection Commonly known as:  LANTUS Inject 0.12 mLs (12 Units total) into the skin at bedtime.  Indication:  Type 2 Diabetes   metFORMIN 1000 MG tablet Commonly known as:  GLUCOPHAGE Take 1 tablet (1,000 mg total) by mouth 2 (two) times daily with a meal. What changed:    medication strength  how much to take  additional instructions  Indication:  Type 2 Diabetes   naltrexone 50 MG tablet Commonly known as:  DEPADE Take 1 tablet (50 mg total) by mouth daily. Start taking on:  03/21/2017  Indication:  Excessive Use of Alcohol   sertraline 50 MG tablet Commonly known as:  ZOLOFT Take 1 tablet (50 mg total) by mouth daily. Start taking on:  03/21/2017 What changed:    medication strength  how much to take  additional instructions  Indication:  Major Depressive Disorder   simvastatin 20 MG tablet Commonly known as:  ZOCOR Take 1 tablet (20 mg total) by mouth daily. For high cholesterol  Indication:  High Cholesterol      Follow-up Information    [place] Follow up.   Specialty:  Catering manager information: [place] [phone]           Follow-up recommendations:  Activity:  as tolerated Diet:  heart healthy  Comments:  Take all medications as prescribed. Keep all follow-up appointments as scheduled.  Do not consume alcohol or use illegal drugs while on prescription medications. Report any adverse effects from your medications to your primary care provider promptly.  In the event of recurrent symptoms or worsening symptoms, call 911, a crisis hotline, or go to the nearest emergency department for evaluation.   Signed: [name], NP 03/20/2017, 11:39 AM   Patient seen, Suicide Assessment Completed.  Disposition Plan Reviewed

## 2017-03-20 NOTE — [facility] Group Notes (Signed)
Pt attended spiritual care group on grief and loss facilitated by chaplain Chord Takahashi   Group opened with brief discussion and psycho-social ed around grief and loss in relationships and in relation to self - identifying life patterns, circumstances, changes that cause losses. Established group norm of speaking from own life experience. Group goal of establishing open and affirming space for members to share loss and experience with grief, normalize grief experience and provide psycho social education and grief support.     

## 2017-03-20 NOTE — Progress Notes (Signed)
Recreation Therapy Notes  Date: 03/20/17 Time: 0930 Location: 300 Hall Dayroom  Group Topic: Stress Management  Goal Area(s) Addresses:  Patient will verbalize importance of using healthy stress management.  Patient will identify positive emotions associated with healthy stress management.   Behavioral Response: Engaged  Intervention: Stress Management  Activity :  Meditation.  LRT introduced the stress management technique of meditation.  LRT played a meditation that allowed patients to focus on finding forgiveness for people who may have wronged them.  Education:  Stress Management, Discharge Planning.   Education Outcome: Acknowledges edcuation/In group clarification offered/Needs additional education  Clinical Observations/Feedback: Pt attended group.    Victorino Sparrow, LRT/CTRS     Victorino Sparrow A 03/20/2017 10:34 AM

## 2017-06-23 ENCOUNTER — Emergency Department (HOSPITAL_COMMUNITY)
Admission: EM | Admit: 2017-06-23 | Discharge: 2017-06-23 | Disposition: A | Discharge status: Home / Self Care | Payer: Self-pay | Attending: Emergency Medicine | Admitting: Emergency Medicine

## 2017-06-23 ENCOUNTER — Encounter (HOSPITAL_COMMUNITY): Payer: Self-pay

## 2017-06-23 DIAGNOSIS — Z5321 Procedure and treatment not carried out due to patient leaving prior to being seen by health care provider: Secondary | ICD-10-CM | POA: Insufficient documentation

## 2017-06-23 DIAGNOSIS — R739 Hyperglycemia, unspecified: Secondary | ICD-10-CM | POA: Insufficient documentation

## 2017-06-23 LAB — BASIC METABOLIC PANEL
ANION GAP: 14 (ref 5–15)
BUN: 16 mg/dL (ref 6–20)
CALCIUM: 9.1 mg/dL (ref 8.9–10.3)
CHLORIDE: 89 mmol/L — AB (ref 101–111)
CO2: 24 mmol/L (ref 22–32)
Creatinine, Ser: 1.56 mg/dL — ABNORMAL HIGH (ref 0.61–1.24)
GFR calc non Af Amer: 50 mL/min — ABNORMAL LOW (ref >60)
GFR, EST AFRICAN AMERICAN: 58 mL/min — AB (ref >60)
Glucose, Bld: 706 mg/dL (ref 65–99)
POTASSIUM: 4.5 mmol/L (ref 3.5–5.1)
Sodium: 127 mmol/L — ABNORMAL LOW (ref 135–145)

## 2017-06-23 LAB — URINALYSIS, ROUTINE W REFLEX MICROSCOPIC
BILIRUBIN URINE: NEGATIVE
Glucose, UA: >=500 mg/dL — AB
HGB URINE DIPSTICK: NEGATIVE
Ketones, ur: 5 mg/dL — AB
Leukocytes, UA: NEGATIVE
Nitrite: NEGATIVE
Protein, ur: NEGATIVE mg/dL
Specific Gravity, Urine: 1.03 (ref 1.005–1.030)
pH: 6 (ref 5.0–8.0)

## 2017-06-23 LAB — CBC
HEMATOCRIT: 45.4 % (ref 39.0–52.0)
HEMOGLOBIN: 15.6 g/dL (ref 13.0–17.0)
MCH: 29.8 pg (ref 26.0–34.0)
MCHC: 34.4 g/dL (ref 30.0–36.0)
MCV: 86.8 fL (ref 78.0–100.0)
Platelets: 365 10*3/uL (ref 150–400)
RBC: 5.23 MIL/uL (ref 4.22–5.81)
RDW: 11.9 % (ref 11.5–15.5)
WBC: 6.1 10*3/uL (ref 4.0–10.5)

## 2017-06-23 LAB — CBG MONITORING, ED: Glucose-Capillary: >600 mg/dL (ref 65–99)

## 2017-06-23 NOTE — ED Triage Notes (Signed)
Pt states he has been out of lantus for 2 weeks and is now experiencing dry mouth, nausea, weakness. PT CBG >600. PT A&Ox4.

## 2017-06-23 NOTE — ED Notes (Signed)
Pt stated he is going to leave, patient made aware we are cleaning a room for him and will get him back to be seen by a provider as soon as possible. Pt states he has things he needs to take care of at home, pts wife stated she would talk with patient and would let staff know if he was going to stay to be seen or not.

## 2017-06-23 NOTE — ED Notes (Signed)
CBG >600 °

## 2017-06-23 NOTE — ED Notes (Signed)
After speaking with wife, pt states he has decided to leave, states he has too much to do at home. Pt made aware we are cleaning a room for him and would like for him to be seen by a provider, pt declined and stated he appreciated [place] trying to help but that he cannot stay. Pt ambulatory out of ED with wife, nad.

## 2017-12-14 ENCOUNTER — Encounter (HOSPITAL_COMMUNITY): Payer: Self-pay | Admitting: *Deleted

## 2017-12-14 ENCOUNTER — Other Ambulatory Visit: Payer: Self-pay

## 2017-12-14 ENCOUNTER — Observation Stay (HOSPITAL_COMMUNITY)
Admission: EM | Admit: 2017-12-14 | Discharge: 2017-12-16 | Disposition: A | Discharge status: Home / Self Care | Payer: Self-pay | Attending: Internal Medicine | Admitting: Internal Medicine

## 2017-12-14 DIAGNOSIS — F1410 Cocaine abuse, uncomplicated: Secondary | ICD-10-CM | POA: Insufficient documentation

## 2017-12-14 DIAGNOSIS — G4733 Obstructive sleep apnea (adult) (pediatric): Secondary | ICD-10-CM | POA: Insufficient documentation

## 2017-12-14 DIAGNOSIS — E1165 Type 2 diabetes mellitus with hyperglycemia: Secondary | ICD-10-CM | POA: Diagnosis present

## 2017-12-14 DIAGNOSIS — F332 Major depressive disorder, recurrent severe without psychotic features: Secondary | ICD-10-CM | POA: Insufficient documentation

## 2017-12-14 DIAGNOSIS — Z833 Family history of diabetes mellitus: Secondary | ICD-10-CM | POA: Insufficient documentation

## 2017-12-14 DIAGNOSIS — F1920 Other psychoactive substance dependence, uncomplicated: Secondary | ICD-10-CM | POA: Diagnosis present

## 2017-12-14 DIAGNOSIS — Z8249 Family history of ischemic heart disease and other diseases of the circulatory system: Secondary | ICD-10-CM | POA: Insufficient documentation

## 2017-12-14 DIAGNOSIS — L03311 Cellulitis of abdominal wall: Secondary | ICD-10-CM | POA: Insufficient documentation

## 2017-12-14 DIAGNOSIS — N183 Chronic kidney disease, stage 3 (moderate): Secondary | ICD-10-CM | POA: Insufficient documentation

## 2017-12-14 DIAGNOSIS — Z9119 Patient's noncompliance with other medical treatment and regimen: Secondary | ICD-10-CM | POA: Insufficient documentation

## 2017-12-14 DIAGNOSIS — F1120 Opioid dependence, uncomplicated: Secondary | ICD-10-CM

## 2017-12-14 DIAGNOSIS — F1994 Other psychoactive substance use, unspecified with psychoactive substance-induced mood disorder: Secondary | ICD-10-CM | POA: Insufficient documentation

## 2017-12-14 DIAGNOSIS — E1122 Type 2 diabetes mellitus with diabetic chronic kidney disease: Principal | ICD-10-CM | POA: Insufficient documentation

## 2017-12-14 DIAGNOSIS — F1020 Alcohol dependence, uncomplicated: Secondary | ICD-10-CM | POA: Diagnosis present

## 2017-12-14 DIAGNOSIS — I10 Essential (primary) hypertension: Secondary | ICD-10-CM | POA: Diagnosis present

## 2017-12-14 DIAGNOSIS — I129 Hypertensive chronic kidney disease with stage 1 through stage 4 chronic kidney disease, or unspecified chronic kidney disease: Secondary | ICD-10-CM | POA: Insufficient documentation

## 2017-12-14 DIAGNOSIS — Z79899 Other long term (current) drug therapy: Secondary | ICD-10-CM | POA: Insufficient documentation

## 2017-12-14 DIAGNOSIS — F1210 Cannabis abuse, uncomplicated: Secondary | ICD-10-CM | POA: Insufficient documentation

## 2017-12-14 DIAGNOSIS — E872 Acidosis: Secondary | ICD-10-CM | POA: Insufficient documentation

## 2017-12-14 DIAGNOSIS — L0390 Cellulitis, unspecified: Secondary | ICD-10-CM | POA: Diagnosis present

## 2017-12-14 DIAGNOSIS — R739 Hyperglycemia, unspecified: Secondary | ICD-10-CM

## 2017-12-14 DIAGNOSIS — Z794 Long term (current) use of insulin: Secondary | ICD-10-CM | POA: Insufficient documentation

## 2017-12-14 DIAGNOSIS — Z23 Encounter for immunization: Secondary | ICD-10-CM | POA: Insufficient documentation

## 2017-12-14 DIAGNOSIS — Z7982 Long term (current) use of aspirin: Secondary | ICD-10-CM | POA: Insufficient documentation

## 2017-12-14 DIAGNOSIS — N289 Disorder of kidney and ureter, unspecified: Secondary | ICD-10-CM

## 2017-12-14 DIAGNOSIS — E7800 Pure hypercholesterolemia, unspecified: Secondary | ICD-10-CM | POA: Insufficient documentation

## 2017-12-14 LAB — BASIC METABOLIC PANEL
Anion gap: 13 (ref 5–15)
Anion gap: 9 (ref 5–15)
BUN: 18 mg/dL (ref 6–20)
BUN: 18 mg/dL (ref 6–20)
CHLORIDE: 91 mmol/L — AB (ref 98–111)
CHLORIDE: 97 mmol/L — AB (ref 98–111)
CO2: 25 mmol/L (ref 22–32)
CO2: 26 mmol/L (ref 22–32)
CREATININE: 1.37 mg/dL — AB (ref 0.61–1.24)
Calcium: 8.7 mg/dL — ABNORMAL LOW (ref 8.9–10.3)
Calcium: 9 mg/dL (ref 8.9–10.3)
Creatinine, Ser: 1.03 mg/dL (ref 0.61–1.24)
GFR calc Af Amer: >60 mL/min (ref >60)
GFR calc non Af Amer: 58 mL/min — ABNORMAL LOW (ref >60)
GFR calc non Af Amer: >60 mL/min (ref >60)
Glucose, Bld: 809 mg/dL (ref 70–99)
Glucose, Bld: 425 mg/dL — ABNORMAL HIGH (ref 70–99)
POTASSIUM: 4.3 mmol/L (ref 3.5–5.1)
Potassium: 4.9 mmol/L (ref 3.5–5.1)
SODIUM: 132 mmol/L — AB (ref 135–145)
Sodium: 129 mmol/L — ABNORMAL LOW (ref 135–145)

## 2017-12-14 LAB — CBC
HEMATOCRIT: 43.8 % (ref 39.0–52.0)
HEMOGLOBIN: 14.1 g/dL (ref 13.0–17.0)
MCH: 30.1 pg (ref 26.0–34.0)
MCHC: 32.2 g/dL (ref 30.0–36.0)
MCV: 93.4 fL (ref 80.0–100.0)
NRBC: 0 % (ref 0.0–0.2)
Platelets: 318 10*3/uL (ref 150–400)
RBC: 4.69 MIL/uL (ref 4.22–5.81)
RDW: 12.3 % (ref 11.5–15.5)
WBC: 4.2 10*3/uL (ref 4.0–10.5)

## 2017-12-14 LAB — URINALYSIS, ROUTINE W REFLEX MICROSCOPIC
Bilirubin Urine: NEGATIVE
Glucose, UA: >=500 mg/dL — AB
Hgb urine dipstick: NEGATIVE
KETONES UR: NEGATIVE mg/dL
Nitrite: NEGATIVE
PROTEIN: NEGATIVE mg/dL
Specific Gravity, Urine: 1.024 (ref 1.005–1.030)
pH: 7 (ref 5.0–8.0)

## 2017-12-14 LAB — GLUCOSE, CAPILLARY
GLUCOSE-CAPILLARY: 436 mg/dL — AB (ref 70–99)
GLUCOSE-CAPILLARY: 553 mg/dL — AB (ref 70–99)
Glucose-Capillary: 253 mg/dL — ABNORMAL HIGH (ref 70–99)
Glucose-Capillary: 383 mg/dL — ABNORMAL HIGH (ref 70–99)

## 2017-12-14 LAB — CBG MONITORING, ED: Glucose-Capillary: >600 mg/dL (ref 70–99)

## 2017-12-14 MED ORDER — DEXTROSE-NACL 5-0.45 % IV SOLN: INTRAVENOUS | Status: DC

## 2017-12-14 MED ORDER — ENOXAPARIN SODIUM 40 MG/0.4ML ~~LOC~~ SOLN
40.0000 mg | SUBCUTANEOUS | Status: DC
  Administered 2017-12-15: 40 mg via SUBCUTANEOUS
  Filled 2017-12-14 (×2): qty 0.4

## 2017-12-14 MED ORDER — FOLIC ACID 1 MG PO TABS
1.0000 mg | ORAL_TABLET | Freq: Every day | ORAL | Status: DC
  Administered 2017-12-14 – 2017-12-16 (×3): 1 mg via ORAL
  Filled 2017-12-14 (×3): qty 1

## 2017-12-14 MED ORDER — DEXTROSE 50 % IV SOLN: 25.0000 mL | INTRAVENOUS | Status: DC | PRN

## 2017-12-14 MED ORDER — DEXTROSE-NACL 5-0.45 % IV SOLN
INTRAVENOUS | Status: DC
  Administered 2017-12-15: 01:00:00 via INTRAVENOUS

## 2017-12-14 MED ORDER — PNEUMOCOCCAL VAC POLYVALENT 25 MCG/0.5ML IJ INJ
0.5000 mL | INJECTION | INTRAMUSCULAR | Status: AC
  Administered 2017-12-15: 0.5 mL via INTRAMUSCULAR
  Filled 2017-12-14: qty 0.5

## 2017-12-14 MED ORDER — SODIUM CHLORIDE 0.9 % IV BOLUS
1000.0000 mL | Freq: Once | INTRAVENOUS | Status: AC
  Administered 2017-12-14: 1000 mL via INTRAVENOUS

## 2017-12-14 MED ORDER — ASPIRIN EC 81 MG PO TBEC
81.0000 mg | DELAYED_RELEASE_TABLET | Freq: Every day | ORAL | Status: DC
  Administered 2017-12-15 – 2017-12-16 (×2): 81 mg via ORAL
  Filled 2017-12-14 (×2): qty 1

## 2017-12-14 MED ORDER — INSULIN REGULAR BOLUS VIA INFUSION
0.0000 [IU] | Freq: Three times a day (TID) | INTRAVENOUS | Status: DC
  Filled 2017-12-14: qty 10

## 2017-12-14 MED ORDER — POTASSIUM CHLORIDE 10 MEQ/100ML IV SOLN
10.0000 meq | Freq: Once | INTRAVENOUS | Status: AC
  Administered 2017-12-14: 10 meq via INTRAVENOUS
  Filled 2017-12-14: qty 100

## 2017-12-14 MED ORDER — SODIUM CHLORIDE 0.9 % IV SOLN
INTRAVENOUS | Status: DC
  Administered 2017-12-14: 19:00:00 via INTRAVENOUS

## 2017-12-14 MED ORDER — CEPHALEXIN 500 MG PO CAPS
500.0000 mg | ORAL_CAPSULE | Freq: Four times a day (QID) | ORAL | Status: DC
  Administered 2017-12-14 – 2017-12-16 (×6): 500 mg via ORAL
  Filled 2017-12-14 (×6): qty 1

## 2017-12-14 MED ORDER — SODIUM CHLORIDE 0.9 % IV SOLN
INTRAVENOUS | Status: DC
  Administered 2017-12-14: 21:00:00 via INTRAVENOUS

## 2017-12-14 MED ORDER — INFLUENZA VAC SPLIT QUAD 0.5 ML IM SUSY
0.5000 mL | PREFILLED_SYRINGE | INTRAMUSCULAR | Status: AC
  Administered 2017-12-15: 0.5 mL via INTRAMUSCULAR
  Filled 2017-12-14: qty 0.5

## 2017-12-14 MED ORDER — LORAZEPAM 2 MG/ML IJ SOLN: 1.0000 mg | Freq: Four times a day (QID) | INTRAMUSCULAR | Status: DC | PRN

## 2017-12-14 MED ORDER — THIAMINE HCL 100 MG/ML IJ SOLN
100.0000 mg | Freq: Every day | INTRAMUSCULAR | Status: DC
  Filled 2017-12-14: qty 2

## 2017-12-14 MED ORDER — LORAZEPAM 1 MG PO TABS: 0.0000 mg | ORAL_TABLET | Freq: Four times a day (QID) | ORAL | Status: DC

## 2017-12-14 MED ORDER — LORAZEPAM 1 MG PO TABS: 0.0000 mg | ORAL_TABLET | Freq: Two times a day (BID) | ORAL | Status: DC

## 2017-12-14 MED ORDER — VITAMIN B-1 100 MG PO TABS
100.0000 mg | ORAL_TABLET | Freq: Every day | ORAL | Status: DC
  Administered 2017-12-14 – 2017-12-16 (×3): 100 mg via ORAL
  Filled 2017-12-14 (×3): qty 1

## 2017-12-14 MED ORDER — INSULIN REGULAR(HUMAN) IN NACL 100-0.9 UT/100ML-% IV SOLN
INTRAVENOUS | Status: DC
  Administered 2017-12-14: 5.4 [IU]/h via INTRAVENOUS
  Filled 2017-12-14: qty 100

## 2017-12-14 MED ORDER — ADULT MULTIVITAMIN W/MINERALS CH
1.0000 | ORAL_TABLET | Freq: Every day | ORAL | Status: DC
  Administered 2017-12-14 – 2017-12-16 (×3): 1 via ORAL
  Filled 2017-12-14 (×3): qty 1

## 2017-12-14 MED ORDER — LORAZEPAM 1 MG PO TABS: 1.0000 mg | ORAL_TABLET | Freq: Four times a day (QID) | ORAL | Status: DC | PRN

## 2017-12-14 NOTE — ED Provider Notes (Signed)
Medical screening examination/treatment/procedure(s) were conducted as a shared visit with non-physician practitioner(s) and myself.  I personally evaluated the patient during the encounter.  None  Patient presents with type 2 diabetes.  Blood sugars have been very elevated.  He has not been able to take his insulin due to financial problems.  He has an increased urinary frequency and thirst.  He has had general fatigue.  Patient is alert and appropriate.  No respiratory distress.  Heart regular.  Lungs grossly clear.  Abdomen soft nontender.  No peripheral edema.  Movements coordinated purposeful symmetric.  Patient is severely hyperglycemic with BS 800's.  Will initiate insulin drip.  Plan for admission.   Charlesetta Shanks, MD 12/14/17 1925

## 2017-12-14 NOTE — ED Triage Notes (Signed)
Pt reprots high CBG of >500 at home. Pt reports being out of his nvalog since the beginning of the week. Pt reports increased urination.pt also reports spider bite to abdomen. Pt noted to have reddness

## 2017-12-14 NOTE — ED Notes (Signed)
Report given to Bobby RN

## 2017-12-14 NOTE — H&P (Signed)
History and Physical    Jonathan Austin KDX:[id number] DOB: 08/29/1965 DOA: 12/14/2017  PCP: Patient, No Pcp Per   Patient coming from: Home   Chief Complaint: Hyperglycemia, polyuria, polydipsia, out of insulin   HPI: Jonathan Austin is a 52 y.o. male with medical history significant for insulin-dependent diabetes mellitus, drug and alcohol abuse, hypertension, and depression, now presenting to the emergency department for evaluation of hyperglycemia at home with polyuria and polydipsia; he also reports a spider bite to the abdomen.  The patient notes that he has been out of his medication for months, reports that this is due to financial constraints, and notes developing polyuria and polydipsia.  He has checked his glucose at home and reports that it has been >500.  He denies any chest pain, headache, shortness of breath, abdominal pain, fevers, or chills.  ED Course: Upon arrival to the ED, patient is found to be afebrile, saturating well on room air, tachycardic in the 110s, and with normal blood pressure.  Chemistry panel is notable for a glucose of 809 with sodium 129 and creatinine 1.37.  CBC is unremarkable.  Urinalysis negative for ketones.  Patient was given a liter of normal saline and started on insulin infusion.  He remains hemodynamically stable and will be observed in the hospital for ongoing evaluation and management of uncontrolled diabetes with marked hyperglycemia and mild nonpurulent cellulitis of the abdominal wall.  Review of Systems:  All other systems reviewed and apart from HPI, are negative.  Past Medical History:  Diagnosis Date  . Cellulitis 06/28/2013  . Cocaine abuse ([place]) 06/28/2013  . Cocaine abuse with cocaine-induced mood disorder ([place])   . Depression   . Diabetes mellitus   . High cholesterol   . Hypertension   . Scrotal infection 06/28/2013  . Sleep apnea   . Suicidal ideations     Past Surgical History:  Procedure Laterality Date  . TONSILLECTOMY        reports that he has never smoked. He has never used smokeless tobacco. He reports that he drinks alcohol. He reports that he has current or past drug history. Drugs: Cocaine and Marijuana.  No Known Allergies  Family History  Problem Relation Age of Onset  . Hypertension Mother   . Hypertension Father   . Diabetes Father      Prior to Admission medications   Medication Sig Start Date End Date Taking? Authorizing Provider  aspirin EC 81 MG tablet Take 81 mg by mouth daily.   Yes [provider]  insulin glargine (LANTUS) 100 UNIT/ML injection Inject 0.12 mLs (12 Units total) into the skin at bedtime. 03/20/17  Yes [name], NP  amLODipine (NORVASC) 5 MG tablet Take 1 tablet (5 mg total) by mouth daily. For high blood pressure Patient not taking: Reported on 03/15/2017 09/02/16   Pattricia Boss, MD  metFORMIN (GLUCOPHAGE) 1000 MG tablet Take 1 tablet (1,000 mg total) by mouth 2 (two) times daily with a meal. Patient not taking: Reported on 12/14/2017 03/20/17   [name], NP  naltrexone (DEPADE) 50 MG tablet Take 1 tablet (50 mg total) by mouth daily. Patient not taking: Reported on 12/14/2017 03/21/17   [name], NP  sertraline (ZOLOFT) 50 MG tablet Take 1 tablet (50 mg total) by mouth daily. Patient not taking: Reported on 12/14/2017 03/21/17   [name], NP  simvastatin (ZOCOR) 20 MG tablet Take 1 tablet (20 mg total) by mouth daily. For high cholesterol Patient not taking:  Reported on 12/14/2017 09/02/16   Pattricia Boss, MD    Physical Exam: Vitals:   12/14/17 1625 12/14/17 1626 12/14/17 1900  BP: (!) 141/97  137/86  Pulse: (!) 113  100  Resp: 16  15  Temp: 97.9 F (36.6 C)    TempSrc: Oral    SpO2: 97%  98%  Weight:  88.5 kg   Height:  5\' 7"  (1.702 m)     Constitutional: NAD, calm  Eyes: PERTLA, lids and conjunctivae normal ENMT: Mucous membranes are moist. Posterior pharynx clear of any exudate or lesions.   Neck: normal, supple, no  masses, no thyromegaly Respiratory: clear to auscultation bilaterally, no wheezing, no crackles. Normal respiratory effort.   Cardiovascular: Rate ~110 and regular. No extremity edema.   Abdomen: No distension, no tenderness, soft. Bowel sounds normal.  Musculoskeletal: no clubbing / cyanosis. No joint deformity upper and lower extremities.    Skin: 2 mm red papule on abdominal wall with faint surrounding erythema and warmth, no drainage. Warm, dry, well-perfused. Neurologic: No facial asymmetry. Sensation intact. Moving all extremities.  Psychiatric: Alert and oriented x 3. Pleasant, cooperative.    Labs on Admission: I have personally reviewed following labs and imaging studies  CBC: Recent Labs  Lab 12/14/17 1734  WBC 4.2  HGB 14.1  HCT 43.8  MCV 93.4  PLT 397   Basic Metabolic Panel: Recent Labs  Lab 12/14/17 1734  NA 129*  K 4.9  CL 91*  CO2 25  GLUCOSE 809*  BUN 18  CREATININE 1.37*  CALCIUM 8.7*   GFR: Estimated Creatinine Clearance: 67 mL/min (A) (by C-G formula based on SCr of 1.37 mg/dL (H)). Liver Function Tests: No results for input(s): AST, ALT, ALKPHOS, BILITOT, PROT, ALBUMIN in the last 168 hours. No results for input(s): LIPASE, AMYLASE in the last 168 hours. No results for input(s): AMMONIA in the last 168 hours. Coagulation Profile: No results for input(s): INR, PROTIME in the last 168 hours. Cardiac Enzymes: No results for input(s): CKTOTAL, CKMB, CKMBINDEX, TROPONINI in the last 168 hours. BNP (last 3 results) No results for input(s): PROBNP in the last 8760 hours. HbA1C: No results for input(s): HGBA1C in the last 72 hours. CBG: Recent Labs  Lab 12/14/17 1731 12/14/17 1922  GLUCAP >600* >600*   Lipid Profile: No results for input(s): CHOL, HDL, LDLCALC, TRIG, CHOLHDL, LDLDIRECT in the last 72 hours. Thyroid Function Tests: No results for input(s): TSH, T4TOTAL, FREET4, T3FREE, THYROIDAB in the last 72 hours. Anemia Panel: No results  for input(s): VITAMINB12, FOLATE, FERRITIN, TIBC, IRON, RETICCTPCT in the last 72 hours. Urine analysis:    Component Value Date/Time   COLORURINE STRAW (A) 12/14/2017 1804   APPEARANCEUR CLEAR 12/14/2017 1804   LABSPEC 1.024 12/14/2017 1804   PHURINE 7.0 12/14/2017 1804   GLUCOSEU >=500 (A) 12/14/2017 1804   HGBUR NEGATIVE 12/14/2017 1804   BILIRUBINUR NEGATIVE 12/14/2017 1804   KETONESUR NEGATIVE 12/14/2017 1804   PROTEINUR NEGATIVE 12/14/2017 1804   UROBILINOGEN 0.2 12/17/2014 1649   NITRITE NEGATIVE 12/14/2017 1804   LEUKOCYTESUR LARGE (A) 12/14/2017 1804   Sepsis Labs: @LABRCNTIP (procalcitonin:4,lacticidven:4) )No results found for this or any previous visit (from the past 240 hour(s)).   Radiological Exams on Admission: No results found.  EKG: Not performed.   Assessment/Plan   1. Uncontrolled type II DM with marked hyperglycemia  - Presents for eval of hyperglycemia with polyuria and polydipsia after going without insulin d/t financial constraints  - He is found to have serum glucose  809 without acidosis, urine ketones, or AMS  - He was given a fluid bolus in ED and started on insulin infusion  - Continue IVF hydration, continue insulin infusion with frequent CBG's and serial chem panels, consult with case management for assistance with outpt medications    2. Abdominal wall cellulitis  - Patient reports "spider bite" on abdomen  - Small red papule noted with faint surrounding erythema concerning for mild non-purulent cellulitis  - Start Keflex   3. Renal insufficiency  - SCr is 1.37 on admission, baseline unclear, was 1 in Feb and 1.57 in May though he also had serum glucose of 706 at that time  - Anticipate some improvement with IVF hydration  - Given one liter in ED and continued on IVF hydration  - Renally-dose medications, avoid nephrotoxins, repeat chem panel    4. Hypertension  - BP at goal  - He has not taken his Norvasc in months  - Monitor and  reinstate antihypertensive as needed    5. Polysubstance abuse  - History of drug and alcohol abuse  - No signs of withdrawal on admission  - Monitor with CIWA, use Ativan as-needed, supplement vitamins    DVT prophylaxis: Lovenox Code Status: Full  Family Communication: Discussed with patient  Consults called: none Admission status: Observation     Vianne Bulls, MD [facility] Pager [phone]  If 7PM-7AM, please contact night-coverage [email or web address] Password [email or web address]  12/14/2017, 7:52 PM

## 2017-12-14 NOTE — ED Provider Notes (Addendum)
[facility] EMERGENCY DEPARTMENT Provider Note   CSN: [id number] Arrival date & time: 12/14/17  1613     History   Chief Complaint Chief Complaint  Patient presents with  . Hyperglycemia    HPI Jonathan Austin is a 52 y.o. male.  HPI    52 year old with a past medical history of type 2 diabetes presents today with hyperglycemia.  Patient notes that due to financial reasons he is been unable to afford his insulin, notes he takes NovoLog.  He notes increasing urinary frequency, thirst, dry mouth and generalized fatigue.  He notes his blood sugar has been running in the 400-500 range.  He denies any upper respiratory symptoms, notes a faint rash to his right abdomen after a spider bite.  Pt denies any alcohol use in the last 2 days.    Past Medical History:  Diagnosis Date  . Cellulitis 06/28/2013  . Cocaine abuse ([place]) 06/28/2013  . Cocaine abuse with cocaine-induced mood disorder ([place])   . Depression   . Diabetes mellitus   . High cholesterol   . Hypertension   . Scrotal infection 06/28/2013  . Sleep apnea   . Suicidal ideations     Patient Active Problem List   Diagnosis Date Noted  . Polysubstance dependence including opioid drug with daily use ([place]) 03/16/2017  . MDD (major depressive disorder), severe ([place]) 03/15/2017  . Cocaine use disorder, severe, dependence ([place]) 07/19/2016  . Major depressive disorder, recurrent episode ([place]) 07/16/2016  . Alcohol use disorder, severe, dependence ([place]) 07/16/2016  . Thoracic aortic atherosclerosis ([place]) 05/09/2016  . Influenza B 05/09/2016  . Dehydration   . Lactic acidosis   . Severe recurrent major depression without psychotic features ([place]) 02/21/2016  . Renal insufficiency 01/20/2016  . HTN (hypertension) 09/20/2015  . HLD (hyperlipidemia) 09/20/2015  . Substance abuse ([place]) 08/30/2014  . Substance induced mood disorder ([place]) 05/07/2014  . Cocaine abuse with cocaine-induced mood disorder ([place])   .  Alcohol abuse   . Sinus tachycardia 02/12/2014  . Cellulitis 06/28/2013  . Uncontrolled diabetes mellitus with hyperglycemia ([place]) 06/28/2013  . Marijuana abuse 06/28/2013  . Diabetes mellitus type II, uncontrolled ([place]) 12/09/2012    Past Surgical History:  Procedure Laterality Date  . TONSILLECTOMY          Home Medications    Prior to Admission medications   Medication Sig Start Date End Date Taking? Authorizing Provider  cephALEXin (KEFLEX) 500 MG capsule Take 1 capsule (500 mg total) by mouth 3 (three) times daily. 12/16/17   Ghimire, Henreitta Leber, MD  glucose blood (FREESTYLE TEST STRIPS) test strip Use as instructed 12/16/17   Ghimire, Henreitta Leber, MD  glucose monitoring kit (FREESTYLE) monitoring kit 1 each by Does not apply route 4 (four) times daily - after meals and at bedtime. 1 month Diabetic Testing Supplies for QAC-QHS accuchecks. 12/16/17   Ghimire, Henreitta Leber, MD  insulin NPH-regular Human (70-30) 100 UNIT/ML injection 12 units in am and 10 units in pm  Please provide 2 months supply 12/16/17   Jonetta Osgood, MD  Lancets (FREESTYLE) lancets Use as instructed 12/16/17   Ghimire, Henreitta Leber, MD  metFORMIN (GLUCOPHAGE) 1000 MG tablet Take 1 tablet (1,000 mg total) by mouth 2 (two) times daily with a meal. 12/16/17   Ghimire, Henreitta Leber, MD    Family History Family History  Problem Relation Age of Onset  . Hypertension Mother   . Hypertension Father   . Diabetes Father  Social History Social History   Tobacco Use  . Smoking status: Never Smoker  . Smokeless tobacco: Never Used  Substance Use Topics  . Alcohol use: Yes    Comment: drinks beer 40 oz x 2 daily  . Drug use: Yes    Types: Cocaine, Marijuana    Comment: smokes 2g crack daily      Allergies   Patient has no known allergies.   Review of Systems Review of Systems  All other systems reviewed and are negative.  Physical Exam Updated Vital Signs BP (!) 133/95 (BP Location: Left Arm)   Pulse  86   Temp 98.6 F (37 C)   Resp 17   Ht _0  (1.702 m)   Wt 88.5 kg   SpO2 100%   BMI 30.54 kg/m   Physical Exam  Constitutional: He is oriented to person, place, and time. He appears well-developed and well-nourished.  HENT:  Head: Normocephalic and atraumatic.  Eyes: Pupils are equal, round, and reactive to light. Conjunctivae are normal. Right eye exhibits no discharge. Left eye exhibits no discharge. No scleral icterus.  Neck: Normal range of motion. No JVD present. No tracheal deviation present.  Pulmonary/Chest: Effort normal. No stridor.  Abdominal: Soft. He exhibits no distension. There is no tenderness.  Neurological: He is alert and oriented to person, place, and time. Coordination normal.  Skin:  Slight erythema noted to left lower abdomen, no induration or pustule  Psychiatric: He has a normal mood and affect. His behavior is normal. Judgment and thought content normal.  Nursing note and vitals reviewed.    ED Treatments / Results  Labs (all labs ordered are listed, but only abnormal results are displayed) Labs Reviewed  BASIC METABOLIC PANEL - Abnormal; Notable for the following components:      Result Value   Sodium 129 (*)    Chloride 91 (*)    Glucose, Bld 809 (*)    Creatinine, Ser 1.37 (*)    Calcium 8.7 (*)    GFR calc non Af Amer 58 (*)    All other components within normal limits  URINALYSIS, ROUTINE W REFLEX MICROSCOPIC - Abnormal; Notable for the following components:   Color, Urine STRAW (*)    Glucose, UA >=500 (*)    Leukocytes, UA LARGE (*)    Bacteria, UA RARE (*)    All other components within normal limits  BASIC METABOLIC PANEL - Abnormal; Notable for the following components:   Sodium 132 (*)    Chloride 97 (*)    Glucose, Bld 425 (*)    All other components within normal limits  BASIC METABOLIC PANEL - Abnormal; Notable for the following components:   Glucose, Bld 180 (*)    Calcium 8.7 (*)    All other components within normal  limits  GLUCOSE, CAPILLARY - Abnormal; Notable for the following components:   Glucose-Capillary 436 (*)    All other components within normal limits  GLUCOSE, CAPILLARY - Abnormal; Notable for the following components:   Glucose-Capillary 253 (*)    All other components within normal limits  GLUCOSE, CAPILLARY - Abnormal; Notable for the following components:   Glucose-Capillary 383 (*)    All other components within normal limits  GLUCOSE, CAPILLARY - Abnormal; Notable for the following components:   Glucose-Capillary 553 (*)    All other components within normal limits  GLUCOSE, CAPILLARY - Abnormal; Notable for the following components:   Glucose-Capillary 190 (*)    All other components within  normal limits  GLUCOSE, CAPILLARY - Abnormal; Notable for the following components:   Glucose-Capillary 115 (*)    All other components within normal limits  GLUCOSE, CAPILLARY - Abnormal; Notable for the following components:   Glucose-Capillary 174 (*)    All other components within normal limits  GLUCOSE, CAPILLARY - Abnormal; Notable for the following components:   Glucose-Capillary 162 (*)    All other components within normal limits  BASIC METABOLIC PANEL - Abnormal; Notable for the following components:   Glucose, Bld 196 (*)    Calcium 8.6 (*)    Anion gap 4 (*)    All other components within normal limits  GLUCOSE, CAPILLARY - Abnormal; Notable for the following components:   Glucose-Capillary 190 (*)    All other components within normal limits  GLUCOSE, CAPILLARY - Abnormal; Notable for the following components:   Glucose-Capillary 168 (*)    All other components within normal limits  GLUCOSE, CAPILLARY - Abnormal; Notable for the following components:   Glucose-Capillary 271 (*)    All other components within normal limits  GLUCOSE, CAPILLARY - Abnormal; Notable for the following components:   Glucose-Capillary 184 (*)    All other components within normal limits    GLUCOSE, CAPILLARY - Abnormal; Notable for the following components:   Glucose-Capillary 208 (*)    All other components within normal limits  GLUCOSE, CAPILLARY - Abnormal; Notable for the following components:   Glucose-Capillary 241 (*)    All other components within normal limits  COMPREHENSIVE METABOLIC PANEL - Abnormal; Notable for the following components:   Calcium 8.5 (*)    Total Protein 6.2 (*)    Albumin 3.2 (*)    All other components within normal limits  GLUCOSE, CAPILLARY - Abnormal; Notable for the following components:   Glucose-Capillary 251 (*)    All other components within normal limits  GLUCOSE, CAPILLARY - Abnormal; Notable for the following components:   Glucose-Capillary 140 (*)    All other components within normal limits  GLUCOSE, CAPILLARY - Abnormal; Notable for the following components:   Glucose-Capillary 141 (*)    All other components within normal limits  CBG MONITORING, ED - Abnormal; Notable for the following components:   Glucose-Capillary >600 (*)    All other components within normal limits  CBG MONITORING, ED - Abnormal; Notable for the following components:   Glucose-Capillary >600 (*)    All other components within normal limits  CBC  HIV ANTIBODY (ROUTINE TESTING W REFLEX)    EKG None  Radiology No results found.  Procedures Procedures (including critical care time) CRITICAL CARE Performed by: Stevie Kern Kamee Bobst   Total critical care time: 35 minutes  Critical care time was exclusive of separately billable procedures and treating other patients.  Critical care was necessary to treat or prevent imminent or life-threatening deterioration.  Critical care was time spent personally by me on the following activities: development of treatment plan with patient and/or surrogate as well as nursing, discussions with consultants, evaluation of patient's response to treatment, examination of patient, obtaining history from patient or  surrogate, ordering and performing treatments and interventions, ordering and review of laboratory studies, ordering and review of radiographic studies, pulse oximetry and re-evaluation of patient's condition.  Medications Ordered in ED Medications  sodium chloride 0.9 % bolus 1,000 mL (0 mLs Intravenous Stopped 12/14/17 1911)  potassium chloride 10 mEq in 100 mL IVPB (0 mEq Intravenous Stopped 12/14/17 2248)  Influenza vac split quadrivalent PF (FLUARIX) injection 0.5  mL (0.5 mLs Intramuscular Given 12/15/17 1822)  pneumococcal 23 valent vaccine (PNU-IMMUNE) injection 0.5 mL (0.5 mLs Intramuscular Given 12/15/17 1019)     Initial Impression / Assessment and Plan / ED Course  I have reviewed the triage vital signs and the nursing notes.  Pertinent labs & imaging results that were available during my care of the patient were reviewed by me and considered in my medical decision making (see chart for details).     Labs: cbc, bmp, POC CBG, UA  Imaging:  Consults: [facility]  Therapeutics: Insulin  Discharge Meds:   Assessment/Plan: 52 year old male presents today with hyperglycemia.  Patient significantly hyperglycemic in the 800 range, patient to be admitted for insulin therapy and hydration.   Final Clinical Impressions(s) / ED Diagnoses   Final diagnoses:  Hyperglycemia    ED Discharge Orders         Ordered    metFORMIN (GLUCOPHAGE) 1000 MG tablet  2 times daily with meals     12/16/17 0815    cephALEXin (KEFLEX) 500 MG capsule  3 times daily     12/16/17 0815    insulin NPH-regular Human (70-30) 100 UNIT/ML injection     12/16/17 0815    glucose blood (FREESTYLE TEST STRIPS) test strip     12/16/17 0815    Lancets (FREESTYLE) lancets     12/16/17 0815    glucose monitoring kit (FREESTYLE) monitoring kit  3 times daily after meals & bedtime     12/16/17 0815    Increase activity slowly     12/16/17 0815    Diet - low sodium heart healthy     12/16/17 0815    Discharge  instructions    Comments:  Follow with Primary MD in 1 week  Check CBG's 2-3 times daily-keep a record of these readings, and take it your next visit with your primary MD  STOP TOBACCO USE, ALCOHOL USE AND COCAINE USE.   Please get a complete blood count and chemistry panel checked by your Primary MD at your next visit, and again as instructed by your Primary MD.  Get Medicines reviewed and adjusted: Please take all your medications with you for your next visit with your Primary MD  Laboratory/radiological data: Please request your Primary MD to go over all hospital tests and procedure/radiological results at the follow up, please ask your Primary MD to get all Hospital records sent to his/her office.  In some cases, they will be blood work, cultures and biopsy results pending at the time of your discharge. Please request that your primary care M.D. follows up on these results.  Also Note the following: If you experience worsening of your admission symptoms, develop shortness of breath, life threatening emergency, suicidal or homicidal thoughts you must seek medical attention immediately by calling 911 or calling your MD immediately  if symptoms less severe.  You must read complete instructions/literature along with all the possible adverse reactions/side effects for all the Medicines you take and that have been prescribed to you. Take any new Medicines after you have completely understood and accpet all the possible adverse reactions/side effects.   Do not drive when taking Pain medications or sleeping medications (Benzodaizepines)  Do not take more than prescribed Pain, Sleep and Anxiety Medications. It is not advisable to combine anxiety,sleep and pain medications without talking with your primary care practitioner  Special Instructions: If you have smoked or chewed Tobacco  in the last 2 yrs please stop smoking, stop any regular  Alcohol  and or any Recreational drug use.  Wear Seat  belts while driving.  Please note: You were cared for by a hospitalist during your hospital stay. Once you are discharged, your primary care physician will handle any further medical issues. Please note that NO REFILLS for any discharge medications will be authorized once you are discharged, as it is imperative that you return to your primary care physician (or establish a relationship with a primary care physician if you do not have one) for your post hospital discharge needs so that they can reassess your need for medications and monitor your lab values.   12/16/17 0815    Diet Carb Modified     12/16/17 0815    Call MD for:  persistant nausea and vomiting     12/16/17 0815              Okey Regal, PA-C 12/18/17 1113    Charlesetta Shanks, MD 12/21/17 0723    Okey Regal, PA-C 01/01/18 1414    Charlesetta Shanks, MD 01/04/18 1336

## 2017-12-15 DIAGNOSIS — F1920 Other psychoactive substance dependence, uncomplicated: Secondary | ICD-10-CM

## 2017-12-15 DIAGNOSIS — F1120 Opioid dependence, uncomplicated: Secondary | ICD-10-CM

## 2017-12-15 LAB — BASIC METABOLIC PANEL
Anion gap: 8 (ref 5–15)
Anion gap: 4 — ABNORMAL LOW (ref 5–15)
BUN: 13 mg/dL (ref 6–20)
BUN: 14 mg/dL (ref 6–20)
CALCIUM: 8.7 mg/dL — AB (ref 8.9–10.3)
CALCIUM: 8.6 mg/dL — AB (ref 8.9–10.3)
CHLORIDE: 103 mmol/L (ref 98–111)
CO2: 27 mmol/L (ref 22–32)
CO2: 29 mmol/L (ref 22–32)
CREATININE: 0.93 mg/dL (ref 0.61–1.24)
CREATININE: 0.99 mg/dL (ref 0.61–1.24)
Chloride: 100 mmol/L (ref 98–111)
GFR calc non Af Amer: >60 mL/min (ref >60)
GFR calc non Af Amer: >60 mL/min (ref >60)
Glucose, Bld: 180 mg/dL — ABNORMAL HIGH (ref 70–99)
Glucose, Bld: 196 mg/dL — ABNORMAL HIGH (ref 70–99)
Potassium: 3.7 mmol/L (ref 3.5–5.1)
Potassium: 3.6 mmol/L (ref 3.5–5.1)
SODIUM: 136 mmol/L (ref 135–145)
Sodium: 135 mmol/L (ref 135–145)

## 2017-12-15 LAB — HIV ANTIBODY (ROUTINE TESTING W REFLEX): HIV Screen 4th Generation wRfx: NONREACTIVE

## 2017-12-15 LAB — GLUCOSE, CAPILLARY
GLUCOSE-CAPILLARY: 115 mg/dL — AB (ref 70–99)
GLUCOSE-CAPILLARY: 168 mg/dL — AB (ref 70–99)
GLUCOSE-CAPILLARY: 174 mg/dL — AB (ref 70–99)
GLUCOSE-CAPILLARY: 190 mg/dL — AB (ref 70–99)
Glucose-Capillary: 162 mg/dL — ABNORMAL HIGH (ref 70–99)
Glucose-Capillary: 190 mg/dL — ABNORMAL HIGH (ref 70–99)
Glucose-Capillary: 184 mg/dL — ABNORMAL HIGH (ref 70–99)
Glucose-Capillary: 271 mg/dL — ABNORMAL HIGH (ref 70–99)
Glucose-Capillary: 208 mg/dL — ABNORMAL HIGH (ref 70–99)
Glucose-Capillary: 241 mg/dL — ABNORMAL HIGH (ref 70–99)
Glucose-Capillary: 251 mg/dL — ABNORMAL HIGH (ref 70–99)
Glucose-Capillary: 140 mg/dL — ABNORMAL HIGH (ref 70–99)

## 2017-12-15 MED ORDER — INSULIN GLARGINE 100 UNIT/ML ~~LOC~~ SOLN
6.0000 [IU] | SUBCUTANEOUS | Status: DC
  Administered 2017-12-15: 6 [IU] via SUBCUTANEOUS
  Filled 2017-12-15: qty 0.06

## 2017-12-15 MED ORDER — INSULIN ASPART 100 UNIT/ML ~~LOC~~ SOLN
0.0000 [IU] | SUBCUTANEOUS | Status: DC
  Administered 2017-12-15 (×2): 5 [IU] via SUBCUTANEOUS

## 2017-12-15 MED ORDER — INSULIN ASPART 100 UNIT/ML ~~LOC~~ SOLN
0.0000 [IU] | Freq: Three times a day (TID) | SUBCUTANEOUS | Status: DC
  Administered 2017-12-15: 5 [IU] via SUBCUTANEOUS
  Administered 2017-12-16: 1 [IU] via SUBCUTANEOUS

## 2017-12-15 MED ORDER — METFORMIN HCL 500 MG PO TABS
1000.0000 mg | ORAL_TABLET | Freq: Two times a day (BID) | ORAL | Status: DC
  Administered 2017-12-16: 1000 mg via ORAL
  Filled 2017-12-15: qty 2

## 2017-12-15 MED ORDER — INSULIN ASPART PROT & ASPART (70-30 MIX) 100 UNIT/ML ~~LOC~~ SUSP
10.0000 [IU] | Freq: Two times a day (BID) | SUBCUTANEOUS | Status: DC
  Administered 2017-12-15 – 2017-12-16 (×2): 10 [IU] via SUBCUTANEOUS
  Filled 2017-12-15: qty 10

## 2017-12-15 NOTE — Progress Notes (Addendum)
Inpatient Diabetes Program Recommendations  AACE/ADA: New Consensus Statement on Inpatient Glycemic Control (2015)  Target Ranges:  Prepandial:   less than 140 mg/dL      Peak postprandial:   less than 180 mg/dL (1-2 hours)      Critically ill patients:  140 - 180 mg/dL   Lab Results  Component Value Date   GLUCAP 208 (H) 12/15/2017   HGBA1C 13.7 (H) 03/16/2017    Review of Glycemic Control Results for Jonathan Austin, Jonathan Austin (MRN [id number]) as of 12/15/2017 10:28  Ref. Range 12/15/2017 05:55 12/15/2017 06:53 12/15/2017 06:57 12/15/2017 07:59  Glucose-Capillary Latest Ref Range: 70 - 99 mg/dL 168 (H) 271 (H) 184 (H) 208 (H)   Diabetes history: Type 2 DM Outpatient Diabetes medications: Lantus 12 units QHS, Metformin 1000 mg BID Current orders for Inpatient glycemic control: Novolog 0-9 units TID, Metformin 1000 mg BID, Novolog 70/30 10 units BID  Inpatient Diabetes Program Recommendations:    Per patient, he has been out of his medications for months. Appears that patient has been on Novolin 70/30 in the past, maybe a better option for affordability at discharge.   Last A1C was from Feb 2019. Could consider repeating A1C.   Noted patient has transitioned off glucostabilizer under DKA order set. Will watch given new orders.   Addendum @ 1400: Spoke with patient regarding outpatient diabetes management. Patient verifies that obtaining outpatient Lantus was unaffordable. Has been on 70/30 in the past and is very familiar. Patient understands to take this with meals and has no problems with injections. Reviewed patient's last A1c of 13.7% and anticipate that current value is no lower. Explained what a A1c is and what it measures. Also reviewed goal A1c with patient, importance of good glucose control @ home, and blood sugar goals. Reviewed patho of DM, need for insulin, role of pancreas, survival skills, sick day rules, vascular changes and comorbidites.  Patient has a meter and testing supplies.  However, he reports that his current one seldom works. Match letter does not cover meter and testing supplies, however the [facility] pharmacy does (in the event he can have F/U appt scheduled there). Was also provided [facility] information for additional supplies if needed. Plans to begin checking again 2 times per day now that he will have insulin.  Patient has no further questions or needs at this time. Will place case management consult for PCP/medication assistance.   Thanks, Bronson Curb, MSN, RNC-OB Diabetes Coordinator [phone] (8a-5p)    Thanks, Bronson Curb, MSN, RNC-OB Diabetes Coordinator [phone] (8a-5p)

## 2017-12-15 NOTE — Progress Notes (Signed)
Pt stated that he would apply mask when he was going to sleep.     12/15/17 2142  BiPAP/CPAP/SIPAP  BiPAP/CPAP/SIPAP Pt Type Adult  Mask Type Full face mask  Mask Size Large  EPAP 12 cmH2O  BiPAP/CPAP/SIPAP CPAP  Patient Home Equipment No  Auto Titrate No

## 2017-12-15 NOTE — Care Management Note (Signed)
Case Management Note  Patient Details  Name: Chaseton Yepiz MRN: [id number] Date of Birth: November 19, 1965  Subjective/Objective:      Uncontrolled DM, hx ofinsulin-dependent diabetes mellitus, drug and alcohol abuse, hypertension, and depression.From home with wife. Pt without PCP, no health insurance.     [place] Foerster (Spouse)       [phone]            Action/Plan: Pt will transition to home when medically stable.  Pt states GLUCOMETER @ home not functional. NCM made MD aware. Pt will need scripts for glucometer/kit/strips. Pt states jobless, limited income...wife managing house hold. Pt will probably need Match Letter @ d/c from NCM.  Post hospital f/u scheduled:  [facility] [place] [id number]    Next Steps: Go on 12/29/2017 at 1 pm    Instructions     Pt states has transportation to home.  Expected Discharge Date:                  Expected Discharge Plan:     In-House Referral:     Discharge planning Services  Follow-up appt scheduled, [facility], CM Consult  Post Acute Care Choice:  NA Choice offered to:  NA  DME Arranged:  N/A DME Agency:  NA  HH Arranged:  NA HH Agency:  NA  Status of Service:  Completed, signed off  If discussed at [place] of Stay Meetings, dates discussed:    Additional Comments:  Sharin Mons, RN 12/15/2017, 3:17 PM

## 2017-12-15 NOTE — Progress Notes (Signed)
TEAM 1 - Stepdown/ICU TEAM  Jonathan Austin  BWL:[id number] DOB: Sep 26, 1965 DOA: 12/14/2017 PCP: Patient, No Pcp Per    Brief Narrative:  52 y.o. male with a hx of insulin-dependent diabetes mellitus, drug and alcohol abuse, hypertension, and depression who presented to the ED w/ hyperglycemia with polyuria and polydipsia. He had been out of his medication for months, due to financial constraints.  In the ED a chemistry panel was notable for a glucose of 809 with sodium 129 and creatinine 1.37. Urinalysis negative for ketones.  Patient was given a liter of normal saline and started on insulin infusion.  Subjective: Resting comfortably in bedside chair. No chest pain, sob, n/v, or abdom pain. Feels insect bite on his abdom is nearly fully resolved.   Assessment & Plan:  Uncontrolled DM2 with marked hyperglycemia  Now off IV insulin, but CBG not yet at goal - transition to 70/30 to assist in affordability - Diabetes Coord following   Abdominal wall cellulitis  reports "spider bite" on abdomen - exam reveals a small red papule with faint surrounding erythema concerning for mild non-purulent cellulitis - cont keflex for 5 days of tx   Renal insufficiency  Cr 1.37 on admission - was 1.0 Feb 2019 and 1.57 in May 2019 - has normalized w/ volume expansion   Hypertension  has not taken his Norvasc in months - BP controlled at present   Polysubstance abuse    DVT prophylaxis: lovenox  Code Status: FULL CODE Family Communication: no family present at time of exam  Disposition Plan: possible D/C home 1/9 if transition to 70/30 ok and CBG controlled   Consultants:  none  Antimicrobials:  Keflex 11/7 >   Objective: Blood pressure 129/90, pulse (!) 102, temperature 97.9 F (36.6 C), temperature source Oral, resp. rate 14, height 5\' 7"  (1.702 m), weight 88.5 kg, SpO2 100 %.  Intake/Output Summary (Last 24 hours) at 12/15/2017 1433 Last data filed at 12/15/2017 1300 Gross per  24 hour  Intake 1203.93 ml  Output 1180 ml  Net 23.93 ml   Filed Weights   12/14/17 1626  Weight: 88.5 kg    Examination: General: No acute respiratory distress Lungs: Clear to auscultation bilaterally without wheezes or crackles Cardiovascular: Regular rate and rhythm without murmur gallop or rub normal S1 and S2 Abdomen: Nontender, nondistended, soft, bowel sounds positive, no rebound, no ascites, no appreciable mass Extremities: No significant cyanosis, clubbing, or edema bilateral lower extremities  CBC: Recent Labs  Lab 12/14/17 1734  WBC 4.2  HGB 14.1  HCT 43.8  MCV 93.4  PLT 681   Basic Metabolic Panel: Recent Labs  Lab 12/14/17 2038 12/15/17 0324 12/15/17 0632  NA 132* 135 136  K 4.3 3.7 3.6  CL 97* 100 103  CO2 26 27 29   GLUCOSE 425* 180* 196*  BUN 18 13 14   CREATININE 1.03 0.93 0.99  CALCIUM 9.0 8.7* 8.6*   GFR: Estimated Creatinine Clearance: 92.7 mL/min (by C-G formula based on SCr of 0.99 mg/dL).  Liver Function Tests: No results for input(s): AST, ALT, ALKPHOS, BILITOT, PROT, ALBUMIN in the last 168 hours. No results for input(s): LIPASE, AMYLASE in the last 168 hours. No results for input(s): AMMONIA in the last 168 hours.   HbA1C: Hgb A1c MFr Bld  Date/Time Value Ref Range Status  03/16/2017 07:35 AM 13.7 (H) 4.8 - 5.6 % Final    Comment:    (NOTE) Pre diabetes:  5.7%-6.4% Diabetes:              >6.4% Glycemic control for   <7.0% adults with diabetes   02/23/2016 06:17 AM 12.0 (H) 4.8 - 5.6 % Final    Comment:    (NOTE)         Pre-diabetes: 5.7 - 6.4         Diabetes: >6.4         Glycemic control for adults with diabetes: <7.0     CBG: Recent Labs  Lab 12/15/17 0555 12/15/17 0653 12/15/17 0657 12/15/17 0759 12/15/17 1246  GLUCAP 168* 271* 184* 208* 241*     Scheduled Meds: . aspirin EC  81 mg Oral Daily  . cephALEXin  500 mg Oral Q6H  . enoxaparin (LOVENOX) injection  40 mg Subcutaneous Q24H  . folic acid   1 mg Oral Daily  . Influenza vac split quadrivalent PF  0.5 mL Intramuscular Tomorrow-1000  . insulin aspart  0-15 Units Subcutaneous Q4H  . insulin glargine  6 Units Subcutaneous Q24H  . LORazepam  0-4 mg Oral Q6H   Followed by  . [START ON 12/16/2017] LORazepam  0-4 mg Oral Q12H  . multivitamin with minerals  1 tablet Oral Daily  . thiamine  100 mg Oral Daily   Or  . thiamine  100 mg Intravenous Daily     LOS: 0 days   Cherene Altes, MD [facility] Office  [phone] Pager - Text Page per Amion  If 7PM-7AM, please contact night-coverage per Amion 12/15/2017, 2:33 PM

## 2017-12-16 DIAGNOSIS — I10 Essential (primary) hypertension: Secondary | ICD-10-CM

## 2017-12-16 DIAGNOSIS — L03311 Cellulitis of abdominal wall: Secondary | ICD-10-CM

## 2017-12-16 DIAGNOSIS — E1165 Type 2 diabetes mellitus with hyperglycemia: Secondary | ICD-10-CM

## 2017-12-16 LAB — COMPREHENSIVE METABOLIC PANEL
ALT: 20 U/L (ref 0–44)
AST: 17 U/L (ref 15–41)
Albumin: 3.2 g/dL — ABNORMAL LOW (ref 3.5–5.0)
Alkaline Phosphatase: 54 U/L (ref 38–126)
Anion gap: 7 (ref 5–15)
BILIRUBIN TOTAL: 0.5 mg/dL (ref 0.3–1.2)
BUN: 12 mg/dL (ref 6–20)
CO2: 29 mmol/L (ref 22–32)
Calcium: 8.5 mg/dL — ABNORMAL LOW (ref 8.9–10.3)
Chloride: 101 mmol/L (ref 98–111)
Creatinine, Ser: 1.14 mg/dL (ref 0.61–1.24)
GFR calc Af Amer: >60 mL/min (ref >60)
Glucose, Bld: 83 mg/dL (ref 70–99)
POTASSIUM: 3.6 mmol/L (ref 3.5–5.1)
Sodium: 137 mmol/L (ref 135–145)
TOTAL PROTEIN: 6.2 g/dL — AB (ref 6.5–8.1)

## 2017-12-16 LAB — GLUCOSE, CAPILLARY: GLUCOSE-CAPILLARY: 141 mg/dL — AB (ref 70–99)

## 2017-12-16 MED ORDER — CEPHALEXIN 500 MG PO CAPS: 500.0000 mg | ORAL_CAPSULE | Freq: Three times a day (TID) | ORAL | 0 refills | Status: DC

## 2017-12-16 MED ORDER — FREESTYLE LANCETS MISC: 0 refills | Status: DC

## 2017-12-16 MED ORDER — FREESTYLE SYSTEM KIT: 1.0000 | PACK | Freq: Three times a day (TID) | 1 refills | Status: DC

## 2017-12-16 MED ORDER — METFORMIN HCL 1000 MG PO TABS: 1000.0000 mg | ORAL_TABLET | Freq: Two times a day (BID) | ORAL | 0 refills | Status: DC

## 2017-12-16 MED ORDER — INSULIN NPH ISOPHANE & REGULAR (70-30) 100 UNIT/ML ~~LOC~~ SUSP: SUBCUTANEOUS | 0 refills | Status: DC

## 2017-12-16 MED ORDER — GLUCOSE BLOOD VI STRP: ORAL_STRIP | 0 refills | Status: DC

## 2017-12-16 NOTE — Progress Notes (Signed)
Patient provided w [facility] letter, no other CM needs

## 2017-12-16 NOTE — Discharge Summary (Signed)
PATIENT DETAILS Name: Jonathan Austin Age: 52 y.o. Sex: male Date of Birth: Jun 15, 1965 MRN: [id number]. Admitting Physician: Vianne Bulls, MD [id number], No Pcp Per  Admit Date: 12/14/2017 Discharge date: 12/16/2017  Recommendations for Outpatient Follow-up:  1. Follow up with PCP in 1-2 weeks 2. Please obtain BMP/CBC in one week 3. Continue counseling regarding importance of abstaining from alcohol use, cocaine use and tobacco use.   4. Please continue counseling regarding long-term compliance to insulin/diabetic control.   Admitted From:  Home  Disposition: [place]: No  Equipment/Devices: None  Discharge Condition: Stable  CODE STATUS: FULL CODE/  Diet recommendation:  Heart Healthy / Carb Modified   Brief Summary: See H&P, Labs, Consult and Test reports for all details in brief, patient is a 52 year old male with history of DM-2, noncompliant with medications due to financial issues, polysubstance use-presenting with polyuria and polydipsia-found to have type 2 diabetes with uncontrolled hyperglycemia and admitted to the hospitalist service for further evaluation and treatment.  See below for further details  Brief Hospital Course: Uncontrolled insulin-dependent type 2 DM with hyperosmolar nonketotic state: Briefly required insulin infusion and IV fluids on admission.  Rapidly improved-subsequently transitioned to subcutaneous insulin.  CBGs currently stable with insulin 70/30 10 units twice daily.  On discharge we will resume his metformin, and continue him on insulin 70/30 ([employer] brand).  He will require follow-up with his PCP for further optimization of his diabetic regimen.  He was counseled extensively this morning by this MD regarding the life-threatening and life disabling effects of uncontrolled hyperglycemia.  Abdominal wall cellulitis: Reports a spider bite in his abdomen-mild erythema that has rapidly improved.  Continue Keflex for a few more  days.  CKD stage III: Creatinine close to usual baseline.  Follow.  Hypertension: Apparently on Norvasc-but noncompliant-BP stable during this hospital stay-continue to monitor off antihypertensives-recheck BP at next visit with PCP-and if significantly elevated start antihypertensives accordingly.  Polysubstance abuse: Claims a daily alcohol/intermittent cocaine use-smokes as well.  Counseled extensively this morning.  Not sure if he has any intention of quitting at this time.  No symptoms of alcohol withdrawal during this hospitalization.  ATF:TDDU  Procedures/Studies: None  Discharge Diagnoses:  Principal Problem:   Uncontrolled diabetes mellitus with hyperglycemia (HCC) Active Problems:   Cellulitis   HTN (hypertension)   Renal insufficiency   Alcohol use disorder, severe, dependence ([place])   Polysubstance dependence including opioid drug with daily use [facility])  Discharge Instructions:  Activity:  As tolerated    Discharge Instructions    Call MD for:  persistant nausea and vomiting   Complete by:  As directed    Diet - low sodium heart healthy   Complete by:  As directed    Diet Carb Modified   Complete by:  As directed    Discharge instructions   Complete by:  As directed    Follow with Primary MD in 1 week  Check CBG's 2-3 times daily-keep a record of these readings, and take it your next visit with your primary MD  STOP TOBACCO USE, ALCOHOL USE AND COCAINE USE.   Please get a complete blood count and chemistry panel checked by your Primary MD at your next visit, and again as instructed by your Primary MD.  Get Medicines reviewed and adjusted: Please take all your medications with you for your next visit with your Primary MD  Laboratory/radiological data: Please request your Primary MD to go over all hospital tests and procedure/radiological  results at the follow up, please ask your Primary MD to get all Hospital records sent to his/her office.  In some cases,  they will be blood work, cultures and biopsy results pending at the time of your discharge. Please request that your primary care M.D. follows up on these results.  Also Note the following: If you experience worsening of your admission symptoms, develop shortness of breath, life threatening emergency, suicidal or homicidal thoughts you must seek medical attention immediately by calling 911 or calling your MD immediately  if symptoms less severe.  You must read complete instructions/literature along with all the possible adverse reactions/side effects for all the Medicines you take and that have been prescribed to you. Take any new Medicines after you have completely understood and accpet all the possible adverse reactions/side effects.   Do not drive when taking Pain medications or sleeping medications (Benzodaizepines)  Do not take more than prescribed Pain, Sleep and Anxiety Medications. It is not advisable to combine anxiety,sleep and pain medications without talking with your primary care practitioner  Special Instructions: If you have smoked or chewed Tobacco  in the last 2 yrs please stop smoking, stop any regular Alcohol  and or any Recreational drug use.  Wear Seat belts while driving.  Please note: You were cared for by a hospitalist during your hospital stay. Once you are discharged, your primary care physician will handle any further medical issues. Please note that NO REFILLS for any discharge medications will be authorized once you are discharged, as it is imperative that you return to your primary care physician (or establish a relationship with a primary care physician if you do not have one) for your post hospital discharge needs so that they can reassess your need for medications and monitor your lab values.   Increase activity slowly   Complete by:  As directed      Allergies as of 12/16/2017   No Known Allergies     Medication List    STOP taking these medications     amLODipine 5 MG tablet Commonly known as:  NORVASC   aspirin EC 81 MG tablet   insulin glargine 100 UNIT/ML injection Commonly known as:  LANTUS   naltrexone 50 MG tablet Commonly known as:  DEPADE   sertraline 50 MG tablet Commonly known as:  ZOLOFT   simvastatin 20 MG tablet Commonly known as:  ZOCOR     TAKE these medications   cephALEXin 500 MG capsule Commonly known as:  KEFLEX Take 1 capsule (500 mg total) by mouth 3 (three) times daily.   freestyle lancets Use as instructed   glucose blood test strip Use as instructed   glucose monitoring kit monitoring kit 1 each by Does not apply route 4 (four) times daily - after meals and at bedtime. 1 month Diabetic Testing Supplies for QAC-QHS accuchecks.   insulin NPH-regular Human (70-30) 100 UNIT/ML injection 12 units in am and 10 units in pm  Please provide 2 months supply   metFORMIN 1000 MG tablet Commonly known as:  GLUCOPHAGE Take 1 tablet (1,000 mg total) by mouth 2 (two) times daily with a meal.      Follow-up Information    [facility] [facility]. Go on 12/29/2017.   Specialty:  Internal Medicine Why:  Ulyess Mort NP Contact information: [place] [phone]         No Known Allergies  Consultations:   None  Other  Procedures/Studies:  No results found.   TODAY-DAY OF DISCHARGE:  Subjective:   Jonathan Austin today has no headache,no chest abdominal pain,no new weakness tingling or numbness, feels much better wants to go home today.   Objective:   Blood pressure (!) 133/95, pulse 86, temperature 98.6 F (37 C), resp. rate 17, height '5\' 7"'$  (1.702 m), weight 88.5 kg, SpO2 100 %.  Intake/Output Summary (Last 24 hours) at 12/16/2017 0815 Last data filed at 12/15/2017 1300 Gross per 24 hour  Intake 144.03 ml  Output 300 ml  Net -155.97 ml   Filed Weights   12/14/17 1626  Weight: 88.5 kg    Exam: Awake Alert, Oriented  *3, No new F.N deficits, Normal affect Mayer.AT,PERRAL Supple Neck,No JVD, No cervical lymphadenopathy appriciated.  Symmetrical Chest wall movement, Good air movement bilaterally, CTAB RRR,No Gallops,Rubs or new Murmurs, No Parasternal Heave +ve B.Sounds, Abd Soft, Non tender, No organomegaly appriciated, No rebound -guarding or rigidity. No Cyanosis, Clubbing or edema, No new Rash or bruise   PERTINENT RADIOLOGIC STUDIES: No results found.   PERTINENT LAB RESULTS: CBC: Recent Labs    12/14/17 1734  WBC 4.2  HGB 14.1  HCT 43.8  PLT 318   CMET CMP     Component Value Date/Time   NA 137 12/16/2017 0443   K 3.6 12/16/2017 0443   CL 101 12/16/2017 0443   CO2 29 12/16/2017 0443   GLUCOSE 83 12/16/2017 0443   BUN 12 12/16/2017 0443   CREATININE 1.14 12/16/2017 0443   CALCIUM 8.5 (L) 12/16/2017 0443   PROT 6.2 (L) 12/16/2017 0443   ALBUMIN 3.2 (L) 12/16/2017 0443   AST 17 12/16/2017 0443   ALT 20 12/16/2017 0443   ALKPHOS 54 12/16/2017 0443   BILITOT 0.5 12/16/2017 0443   GFRNONAA >60 12/16/2017 0443   GFRAA >60 12/16/2017 0443    GFR Estimated Creatinine Clearance: 80.5 mL/min (by C-G formula based on SCr of 1.14 mg/dL). No results for input(s): LIPASE, AMYLASE in the last 72 hours. No results for input(s): CKTOTAL, CKMB, CKMBINDEX, TROPONINI in the last 72 hours. Invalid input(s): POCBNP No results for input(s): DDIMER in the last 72 hours. No results for input(s): HGBA1C in the last 72 hours. No results for input(s): CHOL, HDL, LDLCALC, TRIG, CHOLHDL, LDLDIRECT in the last 72 hours. No results for input(s): TSH, T4TOTAL, T3FREE, THYROIDAB in the last 72 hours.  Invalid input(s): FREET3 No results for input(s): VITAMINB12, FOLATE, FERRITIN, TIBC, IRON, RETICCTPCT in the last 72 hours. Coags: No results for input(s): INR in the last 72 hours.  Invalid input(s): PT Microbiology: No results found for this or any previous visit (from the past 240  hour(s)).  FURTHER DISCHARGE INSTRUCTIONS:  Get Medicines reviewed and adjusted: Please take all your medications with you for your next visit with your Primary MD  Laboratory/radiological data: Please request your Primary MD to go over all hospital tests and procedure/radiological results at the follow up, please ask your Primary MD to get all Hospital records sent to his/her office.  In some cases, they will be blood work, cultures and biopsy results pending at the time of your discharge. Please request that your primary care M.D. goes through all the records of your hospital data and follows up on these results.  Also Note the following: If you experience worsening of your admission symptoms, develop shortness of breath, life threatening emergency, suicidal or homicidal thoughts you must seek medical attention immediately by calling 911 or calling your MD immediately  if symptoms less severe.  You must read complete instructions/literature along with all the possible adverse reactions/side effects for all the Medicines you take and that have been prescribed to you. Take any new Medicines after you have completely understood and accpet all the possible adverse reactions/side effects.   Do not drive when taking Pain medications or sleeping medications (Benzodaizepines)  Do not take more than prescribed Pain, Sleep and Anxiety Medications. It is not advisable to combine anxiety,sleep and pain medications without talking with your primary care practitioner  Special Instructions: If you have smoked or chewed Tobacco  in the last 2 yrs please stop smoking, stop any regular Alcohol  and or any Recreational drug use.  Wear Seat belts while driving.  Please note: You were cared for by a hospitalist during your hospital stay. Once you are discharged, your primary care physician will handle any further medical issues. Please note that NO REFILLS for any discharge medications will be authorized once  you are discharged, as it is imperative that you return to your primary care physician (or establish a relationship with a primary care physician if you do not have one) for your post hospital discharge needs so that they can reassess your need for medications and monitor your lab values.  Total Time spent coordinating discharge including counseling, education and face to face time equals 25 minutes.  SignedOren Binet 12/16/2017 8:15 AM

## 2017-12-16 NOTE — Progress Notes (Signed)
Pt given discharge instructions, prescriptions, and care notes. Pt verbalized understanding AEB no further questions or concerns at this time. IV was discontinued, no redness, pain, or swelling noted at this time. Pt left the floor in stable condition. 

## 2017-12-29 ENCOUNTER — Ambulatory Visit: Payer: Self-pay | Admitting: Family Medicine

## 2018-01-22 ENCOUNTER — Encounter (HOSPITAL_COMMUNITY): Payer: Self-pay | Admitting: Emergency Medicine

## 2018-01-22 ENCOUNTER — Emergency Department (HOSPITAL_COMMUNITY)
Admission: EM | Admit: 2018-01-22 | Discharge: 2018-01-24 | Disposition: A | Discharge status: Other Acute Inpatient Hospital | Payer: Self-pay | Attending: Emergency Medicine | Admitting: Emergency Medicine

## 2018-01-22 ENCOUNTER — Other Ambulatory Visit: Payer: Self-pay

## 2018-01-22 DIAGNOSIS — Z794 Long term (current) use of insulin: Secondary | ICD-10-CM | POA: Insufficient documentation

## 2018-01-22 DIAGNOSIS — R45851 Suicidal ideations: Secondary | ICD-10-CM | POA: Insufficient documentation

## 2018-01-22 DIAGNOSIS — I10 Essential (primary) hypertension: Secondary | ICD-10-CM | POA: Insufficient documentation

## 2018-01-22 DIAGNOSIS — F1020 Alcohol dependence, uncomplicated: Secondary | ICD-10-CM | POA: Insufficient documentation

## 2018-01-22 DIAGNOSIS — E119 Type 2 diabetes mellitus without complications: Secondary | ICD-10-CM | POA: Insufficient documentation

## 2018-01-22 DIAGNOSIS — F1420 Cocaine dependence, uncomplicated: Secondary | ICD-10-CM | POA: Insufficient documentation

## 2018-01-22 DIAGNOSIS — Z79899 Other long term (current) drug therapy: Secondary | ICD-10-CM | POA: Insufficient documentation

## 2018-01-22 DIAGNOSIS — F332 Major depressive disorder, recurrent severe without psychotic features: Secondary | ICD-10-CM | POA: Insufficient documentation

## 2018-01-22 LAB — CBC
HCT: 47 % (ref 39.0–52.0)
Hemoglobin: 16 g/dL (ref 13.0–17.0)
MCH: 30.5 pg (ref 26.0–34.0)
MCHC: 34 g/dL (ref 30.0–36.0)
MCV: 89.5 fL (ref 80.0–100.0)
Platelets: 355 10*3/uL (ref 150–400)
RBC: 5.25 MIL/uL (ref 4.22–5.81)
RDW: 12 % (ref 11.5–15.5)
WBC: 6.8 10*3/uL (ref 4.0–10.5)
nRBC: 0 % (ref 0.0–0.2)

## 2018-01-22 LAB — COMPREHENSIVE METABOLIC PANEL
ALK PHOS: 58 U/L (ref 38–126)
ALT: 25 U/L (ref 0–44)
ANION GAP: 16 — AB (ref 5–15)
AST: 24 U/L (ref 15–41)
Albumin: 4.1 g/dL (ref 3.5–5.0)
BILIRUBIN TOTAL: 0.8 mg/dL (ref 0.3–1.2)
BUN: 18 mg/dL (ref 6–20)
CO2: 22 mmol/L (ref 22–32)
Calcium: 9.3 mg/dL (ref 8.9–10.3)
Chloride: 94 mmol/L — ABNORMAL LOW (ref 98–111)
Creatinine, Ser: 1.31 mg/dL — ABNORMAL HIGH (ref 0.61–1.24)
GFR calc Af Amer: >60 mL/min (ref >60)
GFR calc non Af Amer: >60 mL/min (ref >60)
Glucose, Bld: 427 mg/dL — ABNORMAL HIGH (ref 70–99)
POTASSIUM: 4.8 mmol/L (ref 3.5–5.1)
Sodium: 132 mmol/L — ABNORMAL LOW (ref 135–145)
TOTAL PROTEIN: 7.6 g/dL (ref 6.5–8.1)

## 2018-01-22 LAB — RAPID URINE DRUG SCREEN, HOSP PERFORMED
Amphetamines: NOT DETECTED
Barbiturates: NOT DETECTED
Benzodiazepines: NOT DETECTED
Cocaine: POSITIVE — AB
Opiates: NOT DETECTED
TETRAHYDROCANNABINOL: NOT DETECTED

## 2018-01-22 LAB — ACETAMINOPHEN LEVEL: Acetaminophen (Tylenol), Serum: <10 ug/mL — ABNORMAL LOW (ref 10–30)

## 2018-01-22 LAB — ETHANOL: Alcohol, Ethyl (B): <10 mg/dL (ref <10)

## 2018-01-22 LAB — SALICYLATE LEVEL: Salicylate Lvl: <7 mg/dL (ref 2.8–30.0)

## 2018-01-22 LAB — CBG MONITORING, ED: Glucose-Capillary: 191 mg/dL — ABNORMAL HIGH (ref 70–99)

## 2018-01-22 MED ORDER — SODIUM CHLORIDE 0.9 % IV BOLUS
1000.0000 mL | Freq: Once | INTRAVENOUS | Status: AC
  Administered 2018-01-22: 1000 mL via INTRAVENOUS

## 2018-01-22 MED ORDER — ONDANSETRON HCL 4 MG PO TABS: 4.0000 mg | ORAL_TABLET | Freq: Three times a day (TID) | ORAL | Status: DC | PRN

## 2018-01-22 MED ORDER — INSULIN ASPART PROT & ASPART (70-30 MIX) 100 UNIT/ML ~~LOC~~ SUSP
10.0000 [IU] | Freq: Every day | SUBCUTANEOUS | Status: DC
  Administered 2018-01-23 – 2018-01-24 (×2): 10 [IU] via SUBCUTANEOUS

## 2018-01-22 MED ORDER — INSULIN ASPART PROT & ASPART (70-30 MIX) 100 UNIT/ML ~~LOC~~ SUSP
12.0000 [IU] | Freq: Every day | SUBCUTANEOUS | Status: DC
  Administered 2018-01-23 – 2018-01-24 (×2): 12 [IU] via SUBCUTANEOUS
  Filled 2018-01-22: qty 10

## 2018-01-22 MED ORDER — INSULIN ASPART 100 UNIT/ML ~~LOC~~ SOLN
10.0000 [IU] | Freq: Once | SUBCUTANEOUS | Status: AC
  Administered 2018-01-22: 10 [IU] via INTRAVENOUS

## 2018-01-22 MED ORDER — ACETAMINOPHEN 325 MG PO TABS: 650.0000 mg | ORAL_TABLET | ORAL | Status: DC | PRN

## 2018-01-22 MED ORDER — METFORMIN HCL 500 MG PO TABS
1000.0000 mg | ORAL_TABLET | Freq: Two times a day (BID) | ORAL | Status: DC
  Administered 2018-01-23 – 2018-01-24 (×4): 1000 mg via ORAL
  Filled 2018-01-22 (×4): qty 2

## 2018-01-22 NOTE — ED Notes (Signed)
Valuables with security

## 2018-01-22 NOTE — ED Notes (Signed)
EDP has been notified to place home med [facility]

## 2018-01-22 NOTE — Progress Notes (Signed)
Pt meets inpatient criteria per Lindon Romp, NP. Referral information has been sent to the following hospitals for review: [facility]  [facility]  [facility]   Disposition will continue to assist with inpatient placement needs.   Audree Camel, LCSW, [place] Disposition [place] [facility] [phone]

## 2018-01-22 NOTE — ED Notes (Signed)
Pt wanded by security. 

## 2018-01-22 NOTE — ED Notes (Signed)
Pt reports drinking about 80oz of beer daily, last use was around 3pm today. Pt also reports cocaine use daily, last use around 3pm

## 2018-01-22 NOTE — BH Assessment (Signed)
NP Jonathan Austin recommends psychiatric inpatient treatment.

## 2018-01-22 NOTE — ED Provider Notes (Signed)
[facility] EMERGENCY DEPARTMENT Provider Note   CSN: [id number] Arrival date & time: 01/22/18  1734     History   Chief Complaint Chief Complaint  Patient presents with  . Suicidal    HPI Jonathan Austin is a 52 y.o. male.  Level 5 caveat for psychiatric illness.  Patient presents with suicidal ideation for unknown length of time.  He has a history of polysubstance abuse, depression, diabetes, hypertension.  He has been admitted to a psychiatric facility in the past.  He said he possibly would "cut his wrists or walk in front of traffic".  Endorses cocaine and alcohol today.     Past Medical History:  Diagnosis Date  . Cellulitis 06/28/2013  . Cocaine abuse ([place]) 06/28/2013  . Cocaine abuse with cocaine-induced mood disorder ([place])   . Depression   . Diabetes mellitus   . High cholesterol   . Hypertension   . Scrotal infection 06/28/2013  . Sleep apnea   . Suicidal ideations     Patient Active Problem List   Diagnosis Date Noted  . Polysubstance dependence including opioid drug with daily use ([place]) 03/16/2017  . MDD (major depressive disorder), severe ([place]) 03/15/2017  . Cocaine use disorder, severe, dependence ([place]) 07/19/2016  . Major depressive disorder, recurrent episode ([place]) 07/16/2016  . Alcohol use disorder, severe, dependence ([place]) 07/16/2016  . Thoracic aortic atherosclerosis ([place]) 05/09/2016  . Influenza B 05/09/2016  . Dehydration   . Lactic acidosis   . Severe recurrent major depression without psychotic features ([place]) 02/21/2016  . Renal insufficiency 01/20/2016  . HTN (hypertension) 09/20/2015  . HLD (hyperlipidemia) 09/20/2015  . Substance abuse ([place]) 08/30/2014  . Substance induced mood disorder ([place]) 05/07/2014  . Cocaine abuse with cocaine-induced mood disorder ([place])   . Alcohol abuse   . Sinus tachycardia 02/12/2014  . Cellulitis 06/28/2013  . Uncontrolled diabetes mellitus with hyperglycemia ([place]) 06/28/2013  . Marijuana  abuse 06/28/2013  . Diabetes mellitus type II, uncontrolled ([place]) 12/09/2012    Past Surgical History:  Procedure Laterality Date  . TONSILLECTOMY          Home Medications    Prior to Admission medications   Medication Sig Start Date End Date Taking? Authorizing Provider  cephALEXin (KEFLEX) 500 MG capsule Take 1 capsule (500 mg total) by mouth 3 (three) times daily. 12/16/17   Ghimire, Henreitta Leber, MD  glucose blood (FREESTYLE TEST STRIPS) test strip Use as instructed 12/16/17   Ghimire, Henreitta Leber, MD  glucose monitoring kit (FREESTYLE) monitoring kit 1 each by Does not apply route 4 (four) times daily - after meals and at bedtime. 1 month Diabetic Testing Supplies for QAC-QHS accuchecks. 12/16/17   Ghimire, Henreitta Leber, MD  insulin NPH-regular Human (70-30) 100 UNIT/ML injection 12 units in am and 10 units in pm  Please provide 2 months supply 12/16/17   Jonetta Osgood, MD  Lancets (FREESTYLE) lancets Use as instructed 12/16/17   Ghimire, Henreitta Leber, MD  metFORMIN (GLUCOPHAGE) 1000 MG tablet Take 1 tablet (1,000 mg total) by mouth 2 (two) times daily with a meal. 12/16/17   Ghimire, Henreitta Leber, MD    Family History Family History  Problem Relation Age of Onset  . Hypertension Mother   . Hypertension Father   . Diabetes Father     Social History Social History   Tobacco Use  . Smoking status: Never Smoker  . Smokeless tobacco: Never Used  Substance Use Topics  . Alcohol use: Yes  Comment: drinks beer 40 oz x 2 daily  . Drug use: Yes    Types: Cocaine, Marijuana    Comment: smokes 2g crack daily      Allergies   Patient has no known allergies.   Review of Systems Review of Systems  Unable to perform ROS: Psychiatric disorder     Physical Exam Updated Vital Signs BP (!) 142/96 (BP Location: Right Arm)   Pulse (!) 138   Temp 98.9 F (37.2 C) (Oral)   Resp (!) 24   SpO2 97%   Physical Exam Vitals signs and nursing note reviewed.  Constitutional:       Appearance: He is well-developed.  HENT:     Head: Normocephalic and atraumatic.  Eyes:     Conjunctiva/sclera: Conjunctivae normal.  Neck:     Musculoskeletal: Neck supple.  Cardiovascular:     Rate and Rhythm: Normal rate and regular rhythm.  Pulmonary:     Effort: Pulmonary effort is normal.     Breath sounds: Normal breath sounds.  Abdominal:     General: Bowel sounds are normal.     Palpations: Abdomen is soft.  Musculoskeletal: Normal range of motion.  Skin:    General: Skin is warm and dry.  Neurological:     Mental Status: He is alert and oriented to person, place, and time.  Psychiatric:     Comments: Flat affect, depressed      ED Treatments / Results  Labs (all labs ordered are listed, but only abnormal results are displayed) Labs Reviewed  COMPREHENSIVE METABOLIC PANEL  ETHANOL  SALICYLATE LEVEL  ACETAMINOPHEN LEVEL  CBC  RAPID URINE DRUG SCREEN, HOSP PERFORMED    EKG None  Radiology No results found.  Procedures Procedures (including critical care time)  Medications Ordered in ED Medications - No data to display   Initial Impression / Assessment and Plan / ED Course  I have reviewed the triage vital signs and the nursing notes.  Pertinent labs & imaging results that were available during my care of the patient were reviewed by me and considered in my medical decision making (see chart for details).     Patient presents with suicidal ideation.  He appears to be hemodynamically stable.  Will obtain TTS consult  Final Clinical Impressions(s) / ED Diagnoses   Final diagnoses:  Suicidal ideation    ED Discharge Orders    None       Nat Christen, MD 01/22/18 1819

## 2018-01-22 NOTE — ED Notes (Signed)
Dinner tray ordered.

## 2018-01-22 NOTE — ED Notes (Signed)
All belongings inventoried and placed into locker #3

## 2018-01-22 NOTE — ED Provider Notes (Signed)
Patient noted to be hyperglycemic at 427 with sodium of 132. Corrected sodium of 137 meq/dl. Patient given IV fluid bolus and 10 units of IV insulin.   Per [facility] assessment, inpatient treatment recommended.  Home medications entered for continuing care. CBG improved to 191 after fluids and IV insulin.  Patient awaiting inpatient placement.   Etta Quill, NP 01/22/18 Janus Molder    Nat Christen, MD 01/23/18 [phone]

## 2018-01-22 NOTE — ED Triage Notes (Signed)
Pt here for eval of suicidal thoughts that started around 3pm with a plan to slit his wrist or run in front of traffic. Pt endorses cocaine use and ETOH today.
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
## 2018-01-23 LAB — CBG MONITORING, ED
Glucose-Capillary: 228 mg/dL — ABNORMAL HIGH (ref 70–99)
Glucose-Capillary: 288 mg/dL — ABNORMAL HIGH (ref 70–99)
Glucose-Capillary: 225 mg/dL — ABNORMAL HIGH (ref 70–99)

## 2018-01-23 NOTE — BHH Counselor (Signed)
TTS re-assessment: Patient states he feels the same as he did yesterday. He states "I'm still really stressed and depressed and I need help." TTS counselor informed patient he had been referred to numerous facilities and would be informed as soon as he had placement. Continues to meet in patient criteria.

## 2018-01-23 NOTE — ED Notes (Signed)
Pt received phone call x2, will be advised of phone call policy

## 2018-01-23 NOTE — ED Notes (Signed)
Checked patient cbg it was 1 notified RN of blood sugar

## 2018-01-24 ENCOUNTER — Inpatient Hospital Stay (HOSPITAL_COMMUNITY)
Admission: AD | Admit: 2018-01-24 | Discharge: 2018-01-26 | DRG: 885 | Disposition: A | Discharge status: Home / Self Care | Payer: Federal, State, Local not specified - Other | Source: Intra-hospital | Attending: Psychiatry | Admitting: Psychiatry

## 2018-01-24 ENCOUNTER — Other Ambulatory Visit: Payer: Self-pay

## 2018-01-24 ENCOUNTER — Encounter (HOSPITAL_COMMUNITY): Payer: Self-pay

## 2018-01-24 ENCOUNTER — Other Ambulatory Visit: Payer: Self-pay | Admitting: Registered Nurse

## 2018-01-24 DIAGNOSIS — Z794 Long term (current) use of insulin: Secondary | ICD-10-CM

## 2018-01-24 DIAGNOSIS — E1122 Type 2 diabetes mellitus with diabetic chronic kidney disease: Secondary | ICD-10-CM | POA: Diagnosis present

## 2018-01-24 DIAGNOSIS — E1165 Type 2 diabetes mellitus with hyperglycemia: Secondary | ICD-10-CM | POA: Diagnosis present

## 2018-01-24 DIAGNOSIS — R45851 Suicidal ideations: Secondary | ICD-10-CM | POA: Diagnosis present

## 2018-01-24 DIAGNOSIS — F1020 Alcohol dependence, uncomplicated: Secondary | ICD-10-CM | POA: Diagnosis present

## 2018-01-24 DIAGNOSIS — G4733 Obstructive sleep apnea (adult) (pediatric): Secondary | ICD-10-CM | POA: Diagnosis present

## 2018-01-24 DIAGNOSIS — F322 Major depressive disorder, single episode, severe without psychotic features: Secondary | ICD-10-CM | POA: Diagnosis present

## 2018-01-24 DIAGNOSIS — Z833 Family history of diabetes mellitus: Secondary | ICD-10-CM

## 2018-01-24 DIAGNOSIS — I129 Hypertensive chronic kidney disease with stage 1 through stage 4 chronic kidney disease, or unspecified chronic kidney disease: Secondary | ICD-10-CM | POA: Diagnosis present

## 2018-01-24 DIAGNOSIS — E569 Vitamin deficiency, unspecified: Secondary | ICD-10-CM | POA: Diagnosis present

## 2018-01-24 DIAGNOSIS — F1410 Cocaine abuse, uncomplicated: Secondary | ICD-10-CM | POA: Diagnosis present

## 2018-01-24 DIAGNOSIS — N183 Chronic kidney disease, stage 3 (moderate): Secondary | ICD-10-CM | POA: Diagnosis present

## 2018-01-24 DIAGNOSIS — E7800 Pure hypercholesterolemia, unspecified: Secondary | ICD-10-CM | POA: Diagnosis present

## 2018-01-24 DIAGNOSIS — Z79899 Other long term (current) drug therapy: Secondary | ICD-10-CM | POA: Diagnosis not present

## 2018-01-24 DIAGNOSIS — F332 Major depressive disorder, recurrent severe without psychotic features: Secondary | ICD-10-CM | POA: Diagnosis not present

## 2018-01-24 DIAGNOSIS — Z8249 Family history of ischemic heart disease and other diseases of the circulatory system: Secondary | ICD-10-CM

## 2018-01-24 LAB — CBG MONITORING, ED
Glucose-Capillary: 256 mg/dL — ABNORMAL HIGH (ref 70–99)
Glucose-Capillary: 278 mg/dL — ABNORMAL HIGH (ref 70–99)
Glucose-Capillary: 280 mg/dL — ABNORMAL HIGH (ref 70–99)
Glucose-Capillary: 220 mg/dL — ABNORMAL HIGH (ref 70–99)
Glucose-Capillary: 232 mg/dL — ABNORMAL HIGH (ref 70–99)

## 2018-01-24 LAB — GLUCOSE, CAPILLARY: Glucose-Capillary: 131 mg/dL — ABNORMAL HIGH (ref 70–99)

## 2018-01-24 MED ORDER — ACETAMINOPHEN 325 MG PO TABS: 650.0000 mg | ORAL_TABLET | Freq: Four times a day (QID) | ORAL | Status: DC | PRN

## 2018-01-24 MED ORDER — TRAZODONE HCL 50 MG PO TABS
50.0000 mg | ORAL_TABLET | Freq: Every evening | ORAL | Status: DC | PRN
  Filled 2018-01-24 (×8): qty 1

## 2018-01-24 MED ORDER — ALUM & MAG HYDROXIDE-SIMETH 200-200-20 MG/5ML PO SUSP: 30.0000 mL | ORAL | Status: DC | PRN

## 2018-01-24 MED ORDER — INSULIN GLARGINE 100 UNIT/ML ~~LOC~~ SOLN
6.0000 [IU] | Freq: Every day | SUBCUTANEOUS | Status: DC
  Administered 2018-01-25: 6 [IU] via SUBCUTANEOUS
  Filled 2018-01-24: qty 0.06

## 2018-01-24 MED ORDER — MAGNESIUM HYDROXIDE 400 MG/5ML PO SUSP: 30.0000 mL | Freq: Every day | ORAL | Status: DC | PRN

## 2018-01-24 MED ORDER — HYDROXYZINE HCL 25 MG PO TABS
25.0000 mg | ORAL_TABLET | Freq: Four times a day (QID) | ORAL | Status: DC | PRN
  Filled 2018-01-24: qty 10

## 2018-01-24 MED ORDER — INSULIN ASPART 100 UNIT/ML ~~LOC~~ SOLN
5.0000 [IU] | Freq: Once | SUBCUTANEOUS | Status: AC
  Administered 2018-01-24: 5 [IU] via SUBCUTANEOUS

## 2018-01-24 MED ORDER — INSULIN ASPART 100 UNIT/ML ~~LOC~~ SOLN
0.0000 [IU] | Freq: Three times a day (TID) | SUBCUTANEOUS | Status: DC
  Administered 2018-01-25: 8 [IU] via SUBCUTANEOUS
  Administered 2018-01-25: 3 [IU] via SUBCUTANEOUS
  Administered 2018-01-25 – 2018-01-26 (×2): 5 [IU] via SUBCUTANEOUS

## 2018-01-24 MED ORDER — INSULIN ASPART 100 UNIT/ML ~~LOC~~ SOLN
0.0000 [IU] | Freq: Every day | SUBCUTANEOUS | Status: DC
  Administered 2018-01-25: 4 [IU] via SUBCUTANEOUS

## 2018-01-24 NOTE — ED Notes (Signed)
Pt used one phone call this morning. Pt's wife visiting.

## 2018-01-24 NOTE — ED Notes (Signed)
Patient was given a snack.

## 2018-01-24 NOTE — ED Notes (Signed)
Low Sodium Heart Healthy Diet was ordered for Lunch.

## 2018-01-24 NOTE — ED Notes (Signed)
This RN called report to Estill Bamberg, Therapist, sports at [facility]

## 2018-01-24 NOTE — ED Provider Notes (Signed)
Patient has bed at [facility].  Patient's sugar is currently 280, and they would like it below 200 before transfer to [place].  Will order additional 5 units of NovoLog.  Discussed with nurse, recommended no food in the next 1.5 hours.  Patient is returning to his regular regimen of antihyperglycemics. Will continue to monitor for hypoglycemia.    Albesa Seen, PA-C 01/24/18 1553    Daleen Bo, MD 01/25/18 [phone]

## 2018-01-24 NOTE — Progress Notes (Signed)
Pt accepted to [facility] [place], Bed 303-1 Shuvon Rankin, NP is the accepting provider.  Dr. Myles Lipps, MD is the attending provider.  Call report to [phone]  Hope @ [facility] Psych ED notified.   Pt is Voluntary.  Pt may be transported by [facility]  Pt scheduled  to arrive at [facility] as soon as transport  Romie Minus T. Judi Cong, MSW, [place] Disposition Clinical Social Work [phone] (cell) [phone] (office)

## 2018-01-24 NOTE — Progress Notes (Signed)
Jonathan Austin is a 52 year old male pt admitted on voluntary basis. On admission, he does endorse on-going depression and suicidal thoughts but is able to contract for safety while in the hospital. He reports that he has been using cocaine and also has been drinking and reports that he had been drinking on a daily basis until 3 days ago. He does not report any signs or symptoms of withdrawal and does not appear to be in any acute withdrawal on admission. He reports that he was on medications but that he stopped his medications sometime in March and began abusing drugs since then. He reports that he needs help with his addiction while he is here. He reports financial difficulties and reports having difficulties paying for his medications. He reports that he lives with his wife and reports he will return there once he is discharged. Veronica was escorted to the unit, oriented to the milieu and safety maintained.

## 2018-01-24 NOTE — Progress Notes (Signed)
Patient did attend the evening speaker NA meeting.  

## 2018-01-24 NOTE — ED Notes (Signed)
Received call from [facility] that pt can come over once CBG <200. Notified PA. Will recheck CBG after insulin

## 2018-01-24 NOTE — ED Notes (Signed)
[facility] called for transport. 

## 2018-01-24 NOTE — Progress Notes (Signed)
Inpatient Diabetes Program Recommendations  AACE/ADA: New Consensus Statement on Inpatient Glycemic Control (2019)  Target Ranges:  Prepandial:   less than 140 mg/dL      Peak postprandial:   less than 180 mg/dL (1-2 hours)      Critically ill patients:  140 - 180 mg/dL   Results for WILBURN, KEIR (MRN [id number]) as of 01/24/2018 08:57  Ref. Range 01/23/2018 08:27 01/23/2018 17:47 01/23/2018 22:40 01/24/2018 08:10  Glucose-Capillary Latest Ref Range: 70 - 99 mg/dL 228 (H) 288 (H) 225 (H) 256 (H)   Review of Glycemic Control  Diabetes history: DM2 Outpatient Diabetes medications: 70/30 12 units BID, Metformin 500 mg BID Current orders for Inpatient glycemic control: 70/30 12 units QAM, 70/30 10 units QPM, Metformin 1000 mg BID  Inpatient Diabetes Program Recommendations:  Insulin - Basal: Please consider increasing 70/30 to 15 units QAM and 70/30 12 units QPM. Correction (SSI): While holding in the ER, please consider ordering CBGs with Novolog 0-9 units TID and Novolog 0-5 units QHS. Diet: Please discontinue Regular diet and order Carb Modified diet.  Thanks, Barnie Alderman, RN, MSN, CDE Diabetes Coordinator Inpatient Diabetes Program [phone] (Team Pager from 8am to 5pm)

## 2018-01-24 NOTE — ED Notes (Signed)
This RN spoke with Estill Bamberg, RN at [facility] and notified that pt's CBG trending down. [facility] willing to accept pt. Will call [facility] for pickup

## 2018-01-24 NOTE — Tx Team (Signed)
Initial Treatment Plan 01/24/2018 6:50 PM [name] QKS:[id number]    PATIENT STRESSORS: Medication change or noncompliance Substance abuse   PATIENT STRENGTHS: Ability for insight Average or above average intelligence Capable of independent living General fund of knowledge Motivation for treatment/growth   PATIENT IDENTIFIED PROBLEMS: Depression Suicidal thoughts Substance Abuse "Need help with my drinking and drugs"                     DISCHARGE CRITERIA:  Ability to meet basic life and health needs Improved stabilization in mood, thinking, and/or behavior Reduction of life-threatening or endangering symptoms to within safe limits Verbal commitment to aftercare and medication compliance  PRELIMINARY DISCHARGE PLAN: Attend aftercare/continuing care group Return to previous living arrangement  PATIENT/FAMILY INVOLVEMENT: This treatment plan has been presented to and reviewed with the patient, [name], and/or family member, .  The patient and family have been given the opportunity to ask questions and make suggestions.  [place], [place], [place] 01/24/2018, 6:50 PM

## 2018-01-24 NOTE — ED Provider Notes (Signed)
Psychiatric Default Provider Note:   Patient is a 52 year old male with a past medical history as below presenting for alcohol intoxication and suicidal ideations.  He is currently awaiting inpatient placement.  Evaluated patient with his wife at bedside, patient is calm, collected, and alert and oriented today.  No medical complaints at this time.  Past Medical History:  Diagnosis Date  . Cellulitis 06/28/2013  . Cocaine abuse ([place]) 06/28/2013  . Cocaine abuse with cocaine-induced mood disorder ([place])   . Depression   . Diabetes mellitus   . High cholesterol   . Hypertension   . Scrotal infection 06/28/2013  . Sleep apnea   . Suicidal ideations    Vital signs reviewed from past 24 hours and appear to be improving with resolution of tachycardia.  Vitals:   01/23/18 2247 01/24/18 0605  BP: (!) 125/101 119/87  Pulse: (!) 110 81  Resp: 20 18  Temp: (!) 97.3 F (36.3 C) 97.8 F (36.6 C)  SpO2: 99% 100%   MDM  Patient awaiting inpatient BH placement.  Antihyperglycemics and insulin are ordered on patient.  We will continue to monitor his diabetes.   Albesa Seen, PA-C 01/24/18 8280    Maudie Flakes, MD 01/24/18 [phone]

## 2018-01-24 NOTE — ED Notes (Signed)
[facility] at bedside. [facility] transporter, Joneen Boers receiving pt's belongings. Pt agreeable to transport.

## 2018-01-24 NOTE — ED Notes (Signed)
Pt taking a shower 

## 2018-01-24 NOTE — Progress Notes (Addendum)
Nursing Progress Note: 7p-7a D: Pt currently presents with a anxious/depressed affect and behavior. Interacting minimally with the milieu. Pt reports good sleep during the previous night with current medication regimen. Pt did attend wrap-up group.  A: Pt provided with medications per providers orders. Pt's labs and vitals were monitored throughout the night. Pt supported emotionally and encouraged to express concerns and questions. Pt educated on medications.  R: Pt's safety ensured with 15 minute and environmental checks. Pt currently denies HI and AVH and endorses passive SI. Pt verbally contracts to seek staff if SI,HI, or AVH occurs and to consult with staff before acting on any harmful thoughts. Will continue to monitor.

## 2018-01-25 DIAGNOSIS — F332 Major depressive disorder, recurrent severe without psychotic features: Principal | ICD-10-CM

## 2018-01-25 LAB — GLUCOSE, CAPILLARY
Glucose-Capillary: 172 mg/dL — ABNORMAL HIGH (ref 70–99)
Glucose-Capillary: 255 mg/dL — ABNORMAL HIGH (ref 70–99)
Glucose-Capillary: 234 mg/dL — ABNORMAL HIGH (ref 70–99)
Glucose-Capillary: 308 mg/dL — ABNORMAL HIGH (ref 70–99)

## 2018-01-25 MED ORDER — METFORMIN HCL 500 MG PO TABS
500.0000 mg | ORAL_TABLET | Freq: Two times a day (BID) | ORAL | Status: DC
  Administered 2018-01-25 – 2018-01-26 (×3): 500 mg via ORAL
  Filled 2018-01-25 (×2): qty 1
  Filled 2018-01-25: qty 14
  Filled 2018-01-25 (×2): qty 1
  Filled 2018-01-25: qty 14
  Filled 2018-01-25: qty 1

## 2018-01-25 MED ORDER — PRENATAL MULTIVITAMIN CH
1.0000 | ORAL_TABLET | Freq: Every day | ORAL | Status: DC
  Administered 2018-01-25: 1 via ORAL
  Filled 2018-01-25: qty 7
  Filled 2018-01-25 (×2): qty 1

## 2018-01-25 MED ORDER — CHLORDIAZEPOXIDE HCL 25 MG PO CAPS: 25.0000 mg | ORAL_CAPSULE | Freq: Three times a day (TID) | ORAL | Status: DC | PRN

## 2018-01-25 MED ORDER — NALTREXONE HCL 50 MG PO TABS
50.0000 mg | ORAL_TABLET | Freq: Every day | ORAL | Status: DC
  Administered 2018-01-25 – 2018-01-26 (×2): 50 mg via ORAL
  Filled 2018-01-25 (×2): qty 1
  Filled 2018-01-25: qty 7
  Filled 2018-01-25: qty 1

## 2018-01-25 MED ORDER — AMLODIPINE BESYLATE 10 MG PO TABS
10.0000 mg | ORAL_TABLET | Freq: Every day | ORAL | Status: DC
  Administered 2018-01-25 – 2018-01-26 (×2): 10 mg via ORAL
  Filled 2018-01-25 (×3): qty 1
  Filled 2018-01-25: qty 7

## 2018-01-25 NOTE — BHH Group Notes (Signed)
The focus of this group is to help patients establish daily goals to achieve during treatment and discuss how the patient can incorporate goal setting into their daily lives to aide in recovery.  Pt,. Wants to get into into inpatient program will speak to Social Worker about it today.
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
## 2018-01-25 NOTE — Progress Notes (Signed)
The patient rated his day as a 2 out of 10 initially and then his day improved to a 10 out of 10. He verbalized that he had a good ending to his day since he was able to visit with his wife. His goal for tomorrow is to organize his discharge plans.

## 2018-01-25 NOTE — BHH Suicide Risk Assessment (Signed)
[facility] Admission Suicide Risk Assessment   Nursing information obtained from:  Patient Demographic factors:  Male, Low socioeconomic status, Unemployed Current Mental Status:  Self-harm thoughts Loss Factors:  Decrease in vocational status, Financial problems / change in socioeconomic status Historical Factors:  Prior suicide attempts, Anniversary of important loss Risk Reduction Factors:  Living with another person, especially a relative, Positive therapeutic relationship, Positive coping skills or problem solving skills  Total Time spent with patient: 45 minutes Principal Problem: <principal problem not specified> Diagnosis:  Active Problems:   MDD (major depressive disorder), severe (HCC)   MDD (major depressive disorder), recurrent episode, severe (HCC)  Subjective Data: Significant losses leading to substance abuse and recurrent depression  Continued Clinical Symptoms:  Alcohol Use Disorder Identification Test Final Score (AUDIT): 40 The "Alcohol Use Disorders Identification Test", Guidelines for Use in Primary Care, Second Edition.  World Pharmacologist [facility]). Score between 0-7:  no or low risk or alcohol related problems. Score between 8-15:  moderate risk of alcohol related problems. Score between 16-19:  high risk of alcohol related problems. Score 20 or above:  warrants further diagnostic evaluation for alcohol dependence and treatment.   CLINICAL FACTORS:   Depression:   Severe       COGNITIVE FEATURES THAT CONTRIBUTE TO RISK:  None    SUICIDE RISK:   Mild:  Suicidal ideation of limited frequency, intensity, duration, and specificity.  There are no identifiable plans, no associated intent, mild dysphoria and related symptoms, good self-control (both objective and subjective assessment), few other risk factors, and identifiable protective factors, including available and accessible social support.  PLAN OF CARE: cog therapy, antidepressants are rejected for now I  certify that inpatient services furnished can reasonably be expected to improve the patient's condition.   Johnn Hai, MD 01/25/2018, 8:03 AM
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
## 2018-01-25 NOTE — Progress Notes (Signed)
Patient denies SI, HI and AVH.  Patient has been compliant with medications attended groups and engaged in 1:1 staff talks.   Assess patient for safety engage patient in 1:1 staff talks, offer medications as prescribed.  Continue to monitor for safety.  

## 2018-01-25 NOTE — BHH Suicide Risk Assessment (Signed)
[name] INPATIENT:  Family/Significant Other Suicide Prevention Education  Suicide Prevention Education:  Education Completed; Jonathan Austin (pt's wife) [phone] has been identified by the patient as the family member/significant other with whom the patient will be residing, and identified as the person(s) who will aid the patient in the event of a mental health crisis (suicidal ideations/suicide attempt).  With written consent from the patient, the family member/significant other has been provided the following suicide prevention education, prior to the and/or following the discharge of the patient.  The suicide prevention education provided includes the following:  Suicide risk factors  Suicide prevention and interventions  National Suicide Hotline telephone number  [facility] assessment telephone number  [facility] Emergency Assistance [place] and/or Residential Mobile Crisis Unit telephone number  Request made of family/significant other to:  Remove weapons (e.g., guns, rifles, knives), all items previously/currently identified as safety concern.    Remove drugs/medications (over-the-counter, prescriptions, illicit drugs), all items previously/currently identified as a safety concern.  The family member/significant other verbalizes understanding of the suicide prevention education information provided.  The family member/significant other agrees to remove the items of safety concern listed above.  SPE and aftercare reviewed with pt's wife. Pt's wife shared that she has been struggling financially and emotionally since he has been in the hospital and hopes for him to come home soon. Pt's wife reports no safety concerns regarding pt returning back to the home.   Avelina Laine LCSW 01/25/2018, 2:44 PM

## 2018-01-26 LAB — GLUCOSE, CAPILLARY: GLUCOSE-CAPILLARY: 232 mg/dL — AB (ref 70–99)

## 2018-01-26 MED ORDER — INSULIN GLARGINE 100 UNIT/ML ~~LOC~~ SOLN: 6.0000 [IU] | Freq: Every day | SUBCUTANEOUS | 0 refills | Status: DC

## 2018-01-26 MED ORDER — HYDROXYZINE HCL 25 MG PO TABS: 25.0000 mg | ORAL_TABLET | Freq: Four times a day (QID) | ORAL | 0 refills | Status: DC | PRN

## 2018-01-26 MED ORDER — TRAZODONE HCL 50 MG PO TABS
50.0000 mg | ORAL_TABLET | Freq: Every evening | ORAL | Status: DC | PRN
  Filled 2018-01-26: qty 7

## 2018-01-26 MED ORDER — FREESTYLE LANCETS MISC: 0 refills | Status: DC

## 2018-01-26 MED ORDER — NALTREXONE HCL 50 MG PO TABS: 50.0000 mg | ORAL_TABLET | Freq: Every day | ORAL | 0 refills | Status: DC

## 2018-01-26 MED ORDER — PRENATAL MULTIVITAMIN CH: 1.0000 | ORAL_TABLET | Freq: Every day | ORAL | Status: DC

## 2018-01-26 MED ORDER — AMLODIPINE BESYLATE 10 MG PO TABS: 10.0000 mg | ORAL_TABLET | Freq: Every day | ORAL | 0 refills | Status: DC

## 2018-01-26 MED ORDER — INSULIN ASPART PROT & ASPART (70-30 MIX) 100 UNIT/ML PEN: 12.0000 [IU] | PEN_INJECTOR | Freq: Two times a day (BID) | SUBCUTANEOUS | 0 refills | Status: DC

## 2018-01-26 MED ORDER — TRAZODONE HCL 50 MG PO TABS: 50.0000 mg | ORAL_TABLET | Freq: Every evening | ORAL | 0 refills | Status: DC | PRN

## 2018-01-26 MED ORDER — GLUCOSE BLOOD VI STRP: ORAL_STRIP | 0 refills | Status: DC

## 2018-01-26 MED ORDER — FREESTYLE SYSTEM KIT: 1.0000 | PACK | Freq: Three times a day (TID) | 1 refills | Status: DC

## 2018-01-26 MED ORDER — METFORMIN HCL 500 MG PO TABS: 500.0000 mg | ORAL_TABLET | Freq: Two times a day (BID) | ORAL | 0 refills | Status: DC

## 2018-01-26 NOTE — [facility] Suicide Risk Assessment (Signed)
[facility] Discharge Suicide Risk Assessment   Principal Problem: Depression recurrent severe/alcohol dependence Discharge Diagnoses: Active Problems:   MDD (major depressive disorder), severe (HCC)   MDD (major depressive disorder), recurrent episode, severe ([place])  Current mental status and alert oriented cooperative affect appropriate speech normal rate and tone, denies wanting to harm self or others contracting fully denies psychosis, no cravings tremors or withdrawal  Total Time spent with patient: 30 minutes Mental Status Per Nursing Assessment::   On Admission:  Self-harm thoughts  Demographic Factors:  Male  Loss Factors: Loss of significant relationship  Historical Factors: NA  Risk Reduction Factors:   Sense of responsibility to family, Religious beliefs about death and Positive therapeutic relationship  Continued Clinical Symptoms:  Depression:   Comorbid alcohol abuse/dependence  Cognitive Features That Contribute To Risk:  None    Suicide Risk:  Mild:  Suicidal ideation of limited frequency, intensity, duration, and specificity.  There are no identifiable plans, no associated intent, mild dysphoria and related symptoms, good self-control (both objective and subjective assessment), few other risk factors, and identifiable protective factors, including available and accessible social support.  Follow-up Information    [facility]. Go on 02/05/2018.   Specialty:  Behavioral Health Why:  Your hospital follow up appointment is Monday, 02/05/18 at 8:00a. Please bring your photo ID, proof of insurance, social security card, and any discharge paperwork from this hospitalization.  Contact information: [place] [address] [phone]           Plan Of Care/Follow-up recommendations:  Activity:  full  Candise Crabtree, MD 01/26/2018, 8:43 AM

## 2018-01-26 NOTE — Tx Team (Signed)
Interdisciplinary Treatment and Diagnostic Plan Update  01/26/2018 Time of Session: 0830AM Jonathan Austin MRN: [id number]  Principal Diagnosis: MDD recurrent, severe  Secondary Diagnoses: Active Problems:   MDD (major depressive disorder), severe (HCC)   MDD (major depressive disorder), recurrent episode, severe ([place])   Current Medications:  Current Facility-Administered Medications  Medication Dose Route Frequency Provider Last Rate Last Dose  . acetaminophen (TYLENOL) tablet 650 mg  650 mg Oral Q6H PRN Laverle Hobby, PA-C      . alum & mag hydroxide-simeth (MAALOX/MYLANTA) 200-200-20 MG/5ML suspension 30 mL  30 mL Oral Q4H PRN Patriciaann Clan E, PA-C      . amLODipine (NORVASC) tablet 10 mg  10 mg Oral Daily Johnn Hai, MD   10 mg at 01/26/18 0755  . chlordiazePOXIDE (LIBRIUM) capsule 25 mg  25 mg Oral TID PRN Johnn Hai, MD      . hydrOXYzine (ATARAX/VISTARIL) tablet 25 mg  25 mg Oral Q6H PRN Patriciaann Clan E, PA-C      . insulin aspart (novoLOG) injection 0-15 Units  0-15 Units Subcutaneous TID WC Patriciaann Clan E, PA-C   5 Units at 01/26/18 [phone]  . insulin aspart (novoLOG) injection 0-5 Units  0-5 Units Subcutaneous QHS Laverle Hobby, PA-C   4 Units at 01/25/18 2133  . insulin glargine (LANTUS) injection 6 Units  6 Units Subcutaneous QHS Laverle Hobby, PA-C   6 Units at 01/25/18 2133  . magnesium hydroxide (MILK OF MAGNESIA) suspension 30 mL  30 mL Oral Daily PRN Patriciaann Clan E, PA-C      . metFORMIN (GLUCOPHAGE) tablet 500 mg  500 mg Oral BID WC Johnn Hai, MD   500 mg at 01/26/18 0755  . naltrexone (DEPADE) tablet 50 mg  50 mg Oral Daily Johnn Hai, MD   50 mg at 01/26/18 0755  . prenatal multivitamin tablet 1 tablet  1 tablet Oral Q1200 Johnn Hai, MD   1 tablet at 01/25/18 1207  . traZODone (DESYREL) tablet 50 mg  50 mg Oral QHS,MR X 1 Simon, Spencer E, PA-C       PTA Medications: Medications Prior to Admission  Medication Sig Dispense Refill Last Dose  .  acetaminophen (TYLENOL) 325 MG tablet Take 650 mg by mouth every 6 (six) hours as needed for headache (pain).   2 weeks ago  . Cholecalciferol (VITAMIN D3 PO) Take 1 tablet by mouth daily.   few days ago  . glucose blood (FREESTYLE TEST STRIPS) test strip Use as instructed 100 each 0   . glucose monitoring kit (FREESTYLE) monitoring kit 1 each by Does not apply route 4 (four) times daily - after meals and at bedtime. 1 month Diabetic Testing Supplies for QAC-QHS accuchecks. 1 each 1   . insulin aspart protamine - aspart (NOVOLOG MIX 70/30 FLEXPEN) (70-30) 100 UNIT/ML FlexPen Inject 12 Units into the skin 2 (two) times daily.   week ago  . insulin NPH-regular Human (70-30) 100 UNIT/ML injection 12 units in am and 10 units in pm  Please provide 2 months supply (Patient not taking: Reported on 01/22/2018) 20 mL 0 Not Taking at Unknown time  . Lancets (FREESTYLE) lancets Use as instructed 100 each 0   . metFORMIN (GLUCOPHAGE) 1000 MG tablet Take 1 tablet (1,000 mg total) by mouth 2 (two) times daily with a meal. (Patient not taking: Reported on 01/22/2018) 60 tablet 0 Not Taking at Unknown time  . metFORMIN (GLUCOPHAGE) 500 MG tablet Take 500 mg by mouth 2 (two)  times daily with a meal.   few days ago    Patient Stressors: Medication change or noncompliance Substance abuse  Patient Strengths: Ability for insight Average or above average intelligence Capable of independent living General fund of knowledge Motivation for treatment/growth  Treatment Modalities: Medication Management, Group therapy, Case management,  1 to 1 session with clinician, Psychoeducation, Recreational therapy.   Physician Treatment Plan for Primary Diagnosis: MDD recurrent, severe Long Term Goal(s): Improvement in symptoms so as ready for discharge Improvement in symptoms so as ready for discharge   Short Term Goals: Ability to identify changes in lifestyle to reduce recurrence of condition will improve Ability to  verbalize feelings will improve Ability to disclose and discuss suicidal ideas Ability to demonstrate self-control will improve Ability to verbalize feelings will improve Ability to disclose and discuss suicidal ideas Ability to demonstrate self-control will improve Ability to identify and develop effective coping behaviors will improve  Medication Management: Evaluate patient's response, side effects, and tolerance of medication regimen.  Therapeutic Interventions: 1 to 1 sessions, Unit Group sessions and Medication administration.  Evaluation of Outcomes: Adequate for Discharge  Physician Treatment Plan for Secondary Diagnosis: Active Problems:   MDD (major depressive disorder), severe (HCC)   MDD (major depressive disorder), recurrent episode, severe ([place])  Long Term Goal(s): Improvement in symptoms so as ready for discharge Improvement in symptoms so as ready for discharge   Short Term Goals: Ability to identify changes in lifestyle to reduce recurrence of condition will improve Ability to verbalize feelings will improve Ability to disclose and discuss suicidal ideas Ability to demonstrate self-control will improve Ability to verbalize feelings will improve Ability to disclose and discuss suicidal ideas Ability to demonstrate self-control will improve Ability to identify and develop effective coping behaviors will improve     Medication Management: Evaluate patient's response, side effects, and tolerance of medication regimen.  Therapeutic Interventions: 1 to 1 sessions, Unit Group sessions and Medication administration.  Evaluation of Outcomes: Adequate for Discharge   RN Treatment Plan for Primary Diagnosis: MDD recurrent, severe Long Term Goal(s): Knowledge of disease and therapeutic regimen to maintain health will improve  Short Term Goals: Ability to remain free from injury will improve, Ability to verbalize frustration and anger appropriately will improve, Ability to  identify and develop effective coping behaviors will improve and Compliance with prescribed medications will improve  Medication Management: RN will administer medications as ordered by provider, will assess and evaluate patient's response and provide education to patient for prescribed medication. RN will report any adverse and/or side effects to prescribing provider.  Therapeutic Interventions: 1 on 1 counseling sessions, Psychoeducation, Medication administration, Evaluate responses to treatment, Monitor vital signs and CBGs as ordered, Perform/monitor CIWA, COWS, AIMS and Fall Risk screenings as ordered, Perform wound care treatments as ordered.  Evaluation of Outcomes: Adequate for Discharge   LCSW Treatment Plan for Primary Diagnosis: MDD recurrent, severe Long Term Goal(s): Safe transition to appropriate next level of care at discharge, Engage patient in therapeutic group addressing interpersonal concerns.  Short Term Goals: Engage patient in aftercare planning with referrals and resources, Increase emotional regulation, Identify triggers associated with mental health/substance abuse issues and Increase skills for wellness and recovery  Therapeutic Interventions: Assess for all discharge needs, 1 to 1 time with Social worker, Explore available resources and support systems, Assess for adequacy in community support network, Educate family and significant other(s) on suicide prevention, Complete Psychosocial Assessment, Interpersonal group therapy.  Evaluation of Outcomes: Adequate for Discharge  Progress in Treatment: Attending groups: Yes. Participating in groups: Yes. Taking medication as prescribed: Yes. Toleration medication: Yes. Family/Significant other contact made: Yes, individual(s) contacted:  collateral information and SPE completed with pt's wife. Patient understands diagnosis: Yes. Discussing patient identified problems/goals with staff: Yes. Medical problems stabilized  or resolved: Yes. Denies suicidal/homicidal ideation: Yes. Issues/concerns per patient self-inventory: No. Other: n/a  New problem(s) identified: No, Describe:  n/a  New Short Term/Long Term Goal(s): detox, medication management for mood stabilization; elimination of SI thoughts; development of comprehensive mental wellness/sobriety plan.   Patient Goals:  "I need help with drinking and drugs. This year is very hard for me."   Discharge Plan or Barriers: Pt plans to return home with wife; follow[facility]up [facility]. Pt states that he will be joining [employer] near his home. [place] pamphlet, [facility] Crisis information, and AA/NA information provided to patient for additional community support and resources.   Reason for Continuation of Hospitalization: none  Estimated Length of Stay: today, 01/26/18  Attendees: Patient: Jonathan Austin 01/26/2018 9:05 AM  Physician: Dr. Jake Samples MD 01/26/2018 9:05 AM  Nursing: Chong Sicilian RN; Legrand Como RN 01/26/2018 9:05 AM  RN Care Manager: x 01/26/2018 9:05 AM  Social Worker: Janice Norrie LCSW 01/26/2018 9:05 AM  Recreational Therapist: x 01/26/2018 9:05 AM  Other: Lindell Spar NP; Harriett Sine NP 01/26/2018 9:05 AM  Other:  01/26/2018 9:05 AM  Other: 01/26/2018 9:05 AM    Scribe for Treatment Team: Avelina Laine, LCSW 01/26/2018 9:05 AM

## 2018-01-26 NOTE — Progress Notes (Signed)
Recreation Therapy Notes  Date: 12.20.19 Time: 0930 Location: [address] Dayroom  Group Topic: Stress Management  Goal Area(s) Addresses:  Patient will verbalize importance of using healthy stress management.  Patient will identify positive emotions associated with healthy stress management.   Behavioral Response: Engaged  Intervention: Stress Management  Activity :  Guided Imagery.  LRT introduced the stress management technique of guided imagery.  LRT read Austin script on sitting and observing the starry sky at night.  Patients were to follow along as script as read.  Education:  Stress Management, Discharge Planning.   Education Outcome: Acknowledges edcuation/In group clarification offered/Needs additional education  Clinical Observations/Feedback: Pt attended and participated in group.     Jonathan Austin, LRT/CTRS         Jonathan Austin 01/26/2018 11:46 AM 

## 2018-01-26 NOTE — Discharge Summary (Signed)
Physician Discharge Summary Note  Patient:  Jonathan Austin is an 52 y.o., male MRN:  [id number] DOB:  October 05, 1965 Patient phone:  [phone] (home)  Patient address:   [address] [place] [address],  Total Time spent with patient: Greater than 30 minutes  Date of Admission:  01/24/2018  Date of Discharge: 01-26-18  Reason for Admission: Worsening symptoms of depression, suicidal ideations & substance abuse.   Principal Problem: MDD (major depressive disorder), recurrent episode, severe [facility])  Discharge Diagnoses: Patient Active Problem List   Diagnosis Date Noted  . MDD (major depressive disorder), recurrent episode, severe ([place]) [F33.2] 01/24/2018    Priority: High  . Alcohol use disorder, severe, dependence ([place]) [F10.20] 07/16/2016    Priority: High  . Cocaine abuse with cocaine-induced mood disorder ([place]) [F14.14]     Priority: Medium  . Polysubstance dependence including opioid drug with daily use ([place]) [F11.20, F19.20] 03/16/2017  . MDD (major depressive disorder), severe ([place]) [F32.2] 03/15/2017  . Cocaine use disorder, severe, dependence ([place]) [F14.20] 07/19/2016  . Major depressive disorder, recurrent episode ([place]) [F33.9] 07/16/2016  . Thoracic aortic atherosclerosis ([place]) [I70.0] 05/09/2016  . Influenza B [J10.1] 05/09/2016  . Dehydration [E86.0]   . Lactic acidosis [E87.2]   . Severe recurrent major depression without psychotic features ([place]) [F33.2] 02/21/2016  . Renal insufficiency [N28.9] 01/20/2016  . HTN (hypertension) [I10] 09/20/2015  . HLD (hyperlipidemia) [E78.5] 09/20/2015  . Substance abuse ([place]) [F19.10] 08/30/2014  . Substance induced mood disorder ([place]) [F19.94] 05/07/2014  . Alcohol abuse [F10.10]   . Sinus tachycardia [R00.0] 02/12/2014  . Cellulitis [L03.90] 06/28/2013  . Uncontrolled diabetes mellitus with hyperglycemia ([place]) [E11.65] 06/28/2013  . Marijuana abuse [F12.10] 06/28/2013  . Diabetes mellitus type II, uncontrolled  ([place]) [E11.65] 12/09/2012   Past Psychiatric History: Cocaine & alcohol use disorder, Substance induced mood disorder.  Past Medical History:  Past Medical History:  Diagnosis Date  . Cellulitis 06/28/2013  . Cocaine abuse ([place]) 06/28/2013  . Cocaine abuse with cocaine-induced mood disorder ([place])   . Depression   . Diabetes mellitus   . High cholesterol   . Hypertension   . Scrotal infection 06/28/2013  . Sleep apnea   . Suicidal ideations     Past Surgical History:  Procedure Laterality Date  . TONSILLECTOMY     Family History:  Family History  Problem Relation Age of Onset  . Hypertension Mother   . Hypertension Father   . Diabetes Father    Family Psychiatric  History: See H&P  Social History:  Social History   Substance and Sexual Activity  Alcohol Use Yes   Comment: drinks beer 40 oz x 2 daily     Social History   Substance and Sexual Activity  Drug Use Yes  . Types: Cocaine, Marijuana   Comment: smokes 2g crack daily     Social History   Socioeconomic History  . Marital status: Married    Spouse name: Not on file  . Number of children: Not on file  . Years of education: Not on file  . Highest education level: Not on file  Occupational History  . Not on file  Social Needs  . Financial resource strain: Not on file  . Food insecurity:    Worry: Not on file    Inability: Not on file  . Transportation needs:    Medical: Not on file    Non-medical: Not on file  Tobacco Use  . Smoking status: Never Smoker  . Smokeless tobacco: Never  Used  Substance and Sexual Activity  . Alcohol use: Yes    Comment: drinks beer 40 oz x 2 daily  . Drug use: Yes    Types: Cocaine, Marijuana    Comment: smokes 2g crack daily   . Sexual activity: Yes  Lifestyle  . Physical activity:    Days per week: Not on file    Minutes per session: Not on file  . Stress: Not on file  Relationships  . Social connections:    Talks on phone: Not on file    Gets together: Not  on file    Attends religious service: Not on file    Active member of club or organization: Not on file    Attends meetings of clubs or organizations: Not on file    Relationship status: Not on file  Other Topics Concern  . Not on file  Social History Narrative  . Not on file   Hospital Course: (Per Md's admission notes): This is a repeat admission for Mr. Orosz.  He is 52 years of age and states that every year during the holidays for the past 5 years he has been profoundly depressed and has been having suicidal thoughts, abusing cocaine and alcohol as well. The source of his depression is the death of his son at age 81, who is a Contractor but died of a cardiac event, then he states shortly thereafter his stepson was shot. This of course has led to recurrent depression but he states he simply does not want to take antidepressants would like to stay medication free.  But he was having thoughts of harming himself presenting to the emergency department endorsing plans to cut his wrists or run in front of traffic and abusing cocaine and alcohol daily as well. Though he states he does not want antidepressants he states that review, the medication for alcohol craving was helpful for him previously and would like to restart that he also agrees to take B vitamins for depression. Has a history of poorly controlled diabetes, history of hypertension and was admitted in November of this year with uncontrolled insulin-dependent type 2 diabetes with hyperosmolar nonketotic state requiring IV fluids and medical stabilization.  Also known to have history of cellulitis of the abdominal wall, renal disease stage III, as mentioned essential hypertension and obstructive sleep apnea so of course he has other risk factors for depression other than significant loss. On current mental status and he is alert oriented to person place time situation affect appropriate for the situation speech normal rate and  tone though he has intermittent thoughts of not wanting to be here he is no specific suicidal plans and can contract for safety here.  Denies hallucinations.  Denies cravings tremors or withdrawal symptoms reports he is never had seizures or DTs. Believes he does not require detox measures at this point time given his   This is one of several discharge summaries from this [facility] hospital alone for [place]. He is know in this hospital for receiving substance abuse & mood stabilization treatments. He has always reported that the trigger for his worsening depression & drug use were the sudden deaths of his biological son & a step-son back to back. His UDS on this admission was positive for Cocaine. He was recommended for mood stabilization treatments.   After his admission evaluation this time around, As[place]en was started on the medication regimen for his presenting symptoms. He received & was discharged on; Hydroxyzine 25 mg prn for  anxiety, Naltrexone 50 mg for alcoholism & Trazodone 50 mg prn for insomnia. He also was resumed on all his pertinent home medications for his other significant pre-existing medical issues presented. He tolerated his treatment regimen without any adverse effects or reactions reported. Traven was enrolled & participated in the group counseling sessions being offered & held on this unit. He learned coping skills.  Avik's symptoms responded well to his treatment regimen. This is evidenced by his reports of improved mood, symptoms & absence of suicidal ideations. He is seen today by his attending psychiatrist for discharge evaluation. He stated that he is pleased that he sought help. Says he is tolerating his medications well. No withdrawal symptoms. No craving for substances. He is no longer feeling depressed. Describes normal energy and ability to think. Able to focus on task. No suicidal thoughts. No homicidal thoughts. No thoughts of violence. Patient reports normal biological functions.    The nursing staff reports that patient has been appropriate on the unit. Patient has been interacting well with peers & staff. No behavioral issues. Patient has not voiced any suicidal thoughts. Patient has not been observed to be internally stimulated or pre-occupied. Patient has been adherent to his treatment recommendations. Patient has been tolerating his medication well, denies any adverse reactions or side effects. Patient was discussed at the team meeting this morning. Team members feel that patient is back to his baseline level of function. Team agrees with plan to discharge patient today to continue mental health care on outpatient basis as noted below.   Upon discharge, Lawsen adamantly denies any SIHI, AVH, delusional thoughts, paranoia or substance withdrawal symptoms. He will continue further psychiatric follow-up care/medication management on an outpatient basis as noted below. He was provided with all the necessary information needed to make this appointment without problems including a 7 days worth, supply samples of his [facility] discharge medications. He was able to engage in safety planning including plan to return to [facility] or contact emergency services if he feels unable to maintain his own safety or the safety of others. Pt had no further questions, comments or concerns He left [facility] with all personal belongings in no apparent distress.   Physical Findings: AIMS: Facial and Oral Movements Muscles of Facial Expression: None, normal Lips and Perioral Area: None, normal Jaw: None, normal Tongue: None, normal,Extremity Movements Upper (arms, wrists, hands, fingers): None, normal Lower (legs, knees, ankles, toes): None, normal, Trunk Movements Neck, shoulders, hips: None, normal, Overall Severity Severity of abnormal movements (highest score from questions above): None, normal Incapacitation due to abnormal movements: None, normal Patient's awareness of abnormal movements (rate only  patient's report): No Awareness, Dental Status Current problems with teeth and/or dentures?: No Does patient usually wear dentures?: No  CIWA:    COWS:     Musculoskeletal: Strength & Muscle Tone: within normal limits Gait & Station: normal Patient leans: N/A  Psychiatric Specialty Exam: Physical Exam  Nursing note and vitals reviewed. Constitutional: He is oriented to person, place, and time. He appears well-developed.  HENT:  Head: Normocephalic.  Eyes: Pupils are equal, round, and reactive to light.  Neck: Normal range of motion.  Cardiovascular: Normal rate.  Respiratory: Effort normal.  GI: Soft.  Genitourinary:    Genitourinary Comments: Deferred   Musculoskeletal: Normal range of motion.  Neurological: He is alert and oriented to person, place, and time.  Skin: Skin is warm and dry.    Review of Systems  Constitutional: Negative.   HENT: Negative.  Eyes: Negative.   Respiratory: Negative.  Negative for cough and shortness of breath.   Cardiovascular: Negative.  Negative for chest pain and palpitations.  Gastrointestinal: Negative.  Negative for abdominal pain, heartburn, nausea and vomiting.  Genitourinary: Negative.   Musculoskeletal: Negative.   Skin: Negative.   Neurological: Negative.   Endo/Heme/Allergies: Negative.   Psychiatric/Behavioral: Positive for depression (Stable) and substance abuse (Hx. cocaine). Negative for hallucinations, memory loss and suicidal ideas. The patient has insomnia (Stable). The patient is not nervous/anxious.     Blood pressure 117/82, pulse 96, temperature 97.7 F (36.5 C), temperature source Oral, resp. rate 16, height 5' 7"  (1.702 m), weight 84.8 kg.Body mass index is 29.29 kg/m.  See Md's SRA   Have you used any form of tobacco in the last 30 days? (Cigarettes, Smokeless Tobacco, Cigars, and/or Pipes): No  Has this patient used any form of tobacco in the last 30 days? (Cigarettes, Smokeless Tobacco, Cigars, and/or Pipes):  No  Blood Alcohol level:  Lab Results  Component Value Date   ETH <10 01/22/2018   ETH <10 34/19/3790   Metabolic Disorder Labs:  Lab Results  Component Value Date   HGBA1C 13.7 (H) 03/16/2017   MPG 346.49 03/16/2017   MPG 298 02/23/2016   No results found for: PROLACTIN Lab Results  Component Value Date   CHOL 265 (H) 08/31/2014   TRIG 351 (H) 08/31/2014   HDL 49 08/31/2014   CHOLHDL 5.4 08/31/2014   VLDL 70 (H) 08/31/2014   LDLCALC 146 (H) 08/31/2014   LDLCALC 163 (H) 05/07/2014   See Psychiatric Specialty Exam and Suicide Risk Assessment completed by Attending Physician prior to discharge.  Discharge destination:  Home  Is patient on multiple antipsychotic therapies at discharge:  No   Has Patient had three or more failed trials of antipsychotic monotherapy by history:  No  Recommended Plan for Multiple Antipsychotic Therapies: NA  Allergies as of 01/26/2018   No Known Allergies     Medication List    STOP taking these medications   acetaminophen 325 MG tablet Commonly known as:  TYLENOL   insulin NPH-regular Human (70-30) 100 UNIT/ML injection   NOVOLOG MIX 70/30 FLEXPEN (70-30) 100 UNIT/ML FlexPen Generic drug:  insulin aspart protamine - aspart   VITAMIN D3 PO     TAKE these medications     Indication  amLODipine 10 MG tablet Commonly known as:  NORVASC Take 1 tablet (10 mg total) by mouth daily. For high blood pressure Start taking on:  January 27, 2018  Indication:  High Blood Pressure Disorder   freestyle lancets Use as instructed: For blood sugar checks What changed:  additional instructions  Indication:  Blood sugar checks   glucose blood test strip Commonly known as:  FREESTYLE TEST STRIPS Use as instructed: For blood sugar monitoring What changed:  additional instructions  Indication:  Blood sugar monitoring   glucose monitoring kit monitoring kit 1 each by Does not apply route 4 (four) times daily - after meals and at bedtime. 1  month Diabetic Testing Supplies for QAC - QHS: For accu checks. What changed:  additional instructions  Indication:  Blood sugar monitoring   hydrOXYzine 25 MG tablet Commonly known as:  ATARAX/VISTARIL Take 1 tablet (25 mg total) by mouth every 6 (six) hours as needed for anxiety.  Indication:  Feeling Anxious   insulin glargine 100 UNIT/ML injection Commonly known as:  LANTUS Inject 0.06 mLs (6 Units total) into the skin at bedtime. For diabetes management  Indication:  Type 2 Diabetes   metFORMIN 500 MG tablet Commonly known as:  GLUCOPHAGE Take 1 tablet (500 mg total) by mouth 2 (two) times daily with a meal. For diabetes management What changed:    medication strength  how much to take  additional instructions  Another medication with the same name was removed. Continue taking this medication, and follow the directions you see here.  Indication:  Type 2 Diabetes   naltrexone 50 MG tablet Commonly known as:  DEPADE Take 1 tablet (50 mg total) by mouth daily. For alcoholism Start taking on:  January 27, 2018  Indication:  Excessive Use of Alcohol   prenatal multivitamin Tabs tablet Take 1 tablet by mouth daily at 12 noon. (May buy from over the counter): Vitamin supplement  Indication:  Vitamin Deficiency   traZODone 50 MG tablet Commonly known as:  DESYREL Take 1 tablet (50 mg total) by mouth at bedtime as needed for sleep.  Indication:  Trouble Sleeping      Follow-up [facility]. Go on 02/05/2018.   Specialty:  Behavioral Health Why:  Your hospital follow up appointment is Monday, 02/05/18 at 8:00a. Please bring your photo ID, proof of insurance, social security card, and any discharge paperwork from this hospitalization.  Contact information: [place] [phone] [phone]          Follow-up recommendations: Activity:  As tolerated Diet: As recommended by your primary care doctor. Keep all scheduled follow-up  appointments as recommended.   Comments: Patient is instructed prior to discharge to: Take all medications as prescribed by his/her mental healthcare provider. Report any adverse effects and or reactions from the medicines to his/her outpatient provider promptly. Patient has been instructed & cautioned: To not engage in alcohol and or illegal drug use while on prescription medicines. In the event of worsening symptoms, patient is instructed to call the crisis hotline, 911 and or go to the nearest ED for appropriate evaluation and treatment of symptoms. To follow-up with his/her primary care provider for your other medical issues, concerns and or health care needs.   Signed: Lindell Spar, NP, PMHNP, FNP-BC 01/26/2018, 1:57 PM

## 2018-01-26 NOTE — Progress Notes (Signed)
Nursing Progress Note: 7p-7a D: Pt currently presents with a anxious/pleasant affect and behavior. Interacting minimally with the milieu. Pt reports good sleep during the previous night with current medication regimen. Pt did attend wrap-up group.  A: Pt provided with medications per providers orders. Pt's labs and vitals were monitored throughout the night. Pt supported emotionally and encouraged to express concerns and questions. Pt educated on medications.  R: Pt's safety ensured with 15 minute and environmental checks. Pt currently denies SI, HI, and AVH. Pt verbally contracts to seek staff if SI,HI, or AVH occurs and to consult with staff before acting on any harmful thoughts. Will continue to monitor.

## 2019-04-01 ENCOUNTER — Other Ambulatory Visit (HOSPITAL_COMMUNITY): Payer: Self-pay | Admitting: Urology

## 2019-04-01 DIAGNOSIS — C61 Malignant neoplasm of prostate: Secondary | ICD-10-CM

## 2019-04-12 ENCOUNTER — Encounter (HOSPITAL_COMMUNITY)
Admission: RE | Admit: 2019-04-12 | Discharge: 2019-04-12 | Disposition: A | Discharge status: Home / Self Care | Payer: Self-pay | Source: Ambulatory Visit | Attending: Urology | Admitting: Urology

## 2019-04-12 ENCOUNTER — Other Ambulatory Visit: Payer: Self-pay

## 2019-04-12 DIAGNOSIS — C61 Malignant neoplasm of prostate: Secondary | ICD-10-CM

## 2019-04-12 LAB — POCT I-STAT CREATININE: Creatinine, Ser: 1 mg/dL (ref 0.61–1.24)

## 2019-04-12 MED ORDER — SODIUM CHLORIDE (PF) 0.9 % IJ SOLN
INTRAMUSCULAR | Status: AC
  Filled 2019-04-12: qty 50

## 2019-04-12 MED ORDER — TECHNETIUM TC 99M MEDRONATE IV KIT
21.6000 | PACK | Freq: Once | INTRAVENOUS | Status: AC
  Administered 2019-04-12: 21.6 via INTRAVENOUS

## 2019-04-12 MED ORDER — IOHEXOL 300 MG/ML  SOLN
100.0000 mL | Freq: Once | INTRAMUSCULAR | Status: AC | PRN
  Administered 2019-04-12: 100 mL via INTRAVENOUS

## 2019-04-17 MED ORDER — SODIUM CHLORIDE (PF) 0.9 % IJ SOLN
INTRAMUSCULAR | Status: AC
  Filled 2019-04-17: qty 50

## 2019-04-22 ENCOUNTER — Telehealth: Payer: Self-pay | Admitting: Medical Oncology

## 2019-04-22 NOTE — Telephone Encounter (Signed)
I called pt to introduce myself as the Prostate Nurse Navigator and the Coordinator of the [facility].  1. I confirmed with the patient he is aware of his referral to the clinic 3/23, arriving at 12:30 pm.  2. I discussed the format of the clinic and the physicians he will be seeing that day.  3. I discussed where the clinic is located and how to contact me. I reviewed [place] parking, registrations and COVID precautions.  4. I confirmed his address and informed him I would be mailing a packet of information and forms to be completed. I asked him to bring them with him the day of his appointment.   He voiced understanding of the above. I asked him to call me if he has any questions or concerns regarding his appointments or the forms he needs to complete.

## 2019-04-25 ENCOUNTER — Encounter: Payer: Self-pay | Admitting: Medical Oncology

## 2019-04-29 ENCOUNTER — Encounter: Payer: Self-pay | Admitting: Radiation Oncology

## 2019-04-29 ENCOUNTER — Telehealth: Payer: Self-pay | Admitting: Medical Oncology

## 2019-04-29 NOTE — Progress Notes (Signed)
GU Location of Tumor / Histology: prostatic adenocarcinoma  If Prostate Cancer, Gleason Score is (4 + 4) and PSA is (317). Prostate volume: 71  Biopsies of prostate (if applicable) revealed:    Past/Anticipated interventions by urology, if any: Prescribed tamsulosin, CT (2 pelvic lymph nodes), bone scan (negative), referral to [facility]  Past/Anticipated interventions by medical oncology, if any: no  Weight changes, if any: denies  Bowel/Bladder complaints, if any: IPSS 22. SHIM 5. Reports nocturia. Reports weak urine stream. Reports urinary urgency. Reports occasional urge incontinence. Denies any bowel complaints.  Nausea/Vomiting, if any: denies  Pain issues, if any:  Joint pain  SAFETY ISSUES:  Prior radiation? Denies   Pacemaker/ICD? denies  Possible current pregnancy? no, male patient  Is the patient on methotrexate? denies  Current Complaints / other details:  54 year old male. Married. Mother and father both alive and well. Smokes 1/2 ppd. Drinks beer.   Reports dental problems and blurry vision. Reports anxiety and depression.

## 2019-04-29 NOTE — Telephone Encounter (Signed)
Spoke with patient to confirm [facility] 3/23, arriving at 12:30 pm. I reviewed location, registration and COVID restrictions. I reminded him to bring his completed medical forms. He voiced understanding.

## 2019-04-30 ENCOUNTER — Ambulatory Visit
Admission: RE | Admit: 2019-04-30 | Discharge: 2019-04-30 | Disposition: A | Discharge status: Home / Self Care | Payer: Self-pay | Source: Ambulatory Visit | Attending: Radiation Oncology | Admitting: Radiation Oncology

## 2019-04-30 ENCOUNTER — Encounter: Payer: Self-pay | Admitting: Medical Oncology

## 2019-04-30 ENCOUNTER — Other Ambulatory Visit: Payer: Self-pay

## 2019-04-30 ENCOUNTER — Inpatient Hospital Stay: Payer: Self-pay | Attending: Oncology | Admitting: Oncology

## 2019-04-30 ENCOUNTER — Encounter: Payer: Self-pay | Admitting: Radiation Oncology

## 2019-04-30 VITALS — BP 129/91 | HR 103 | Temp 97.8°F | Resp 18 | Ht 67.0 in | Wt 218.2 lb

## 2019-04-30 DIAGNOSIS — N401 Enlarged prostate with lower urinary tract symptoms: Secondary | ICD-10-CM | POA: Insufficient documentation

## 2019-04-30 DIAGNOSIS — Z7982 Long term (current) use of aspirin: Secondary | ICD-10-CM | POA: Insufficient documentation

## 2019-04-30 DIAGNOSIS — R972 Elevated prostate specific antigen [PSA]: Secondary | ICD-10-CM | POA: Insufficient documentation

## 2019-04-30 DIAGNOSIS — F329 Major depressive disorder, single episode, unspecified: Secondary | ICD-10-CM | POA: Insufficient documentation

## 2019-04-30 DIAGNOSIS — R3915 Urgency of urination: Secondary | ICD-10-CM | POA: Insufficient documentation

## 2019-04-30 DIAGNOSIS — I10 Essential (primary) hypertension: Secondary | ICD-10-CM | POA: Insufficient documentation

## 2019-04-30 DIAGNOSIS — Z79899 Other long term (current) drug therapy: Secondary | ICD-10-CM | POA: Insufficient documentation

## 2019-04-30 DIAGNOSIS — Z794 Long term (current) use of insulin: Secondary | ICD-10-CM | POA: Insufficient documentation

## 2019-04-30 DIAGNOSIS — E119 Type 2 diabetes mellitus without complications: Secondary | ICD-10-CM | POA: Insufficient documentation

## 2019-04-30 DIAGNOSIS — C61 Malignant neoplasm of prostate: Secondary | ICD-10-CM | POA: Insufficient documentation

## 2019-04-30 DIAGNOSIS — E7800 Pure hypercholesterolemia, unspecified: Secondary | ICD-10-CM | POA: Insufficient documentation

## 2019-04-30 DIAGNOSIS — R351 Nocturia: Secondary | ICD-10-CM | POA: Insufficient documentation

## 2019-04-30 DIAGNOSIS — Z8249 Family history of ischemic heart disease and other diseases of the circulatory system: Secondary | ICD-10-CM | POA: Insufficient documentation

## 2019-04-30 DIAGNOSIS — R350 Frequency of micturition: Secondary | ICD-10-CM | POA: Insufficient documentation

## 2019-04-30 DIAGNOSIS — Z833 Family history of diabetes mellitus: Secondary | ICD-10-CM | POA: Insufficient documentation

## 2019-04-30 DIAGNOSIS — E785 Hyperlipidemia, unspecified: Secondary | ICD-10-CM | POA: Insufficient documentation

## 2019-04-30 HISTORY — DX: Malignant neoplasm of prostate: C61

## 2019-04-30 NOTE — Consult Note (Signed)
[facility]     04/30/2019   --------------------------------------------------------------------------------   Jonathan Austin  [id number]  DOB: 24-Sep-1965, 54 year old Male  SSN: [id number]   PRIMARY CARE:    REFERRING:    PROVIDER:  Ellison Austin, M.D.  TREATING:  Jonathan Austin, M.D.  LOCATION:  [facility] [phone]     --------------------------------------------------------------------------------   CC/HPI: CC: Prostate Cancer   Physician requesting consult: Dr. Aleen Austin  PCP:  Location of consult: [facility] - Prostate Cancer [facility]   Jonathan Austin is a 54 year old gentleman who has a two year history of worsening lower urinary tract symptoms. He had a PSA checked in November 2020 that was 266. He did not show for his initial appointment in December but finally saw Jonathan Austin in February 2021 when his PSA was rechecked and found to be 317. He underwent a TRUS biopsy of the prostate on 03/25/19 that confirmed Gleason 4+4=8 adenocarcinoma of the prostate with 12 out of 12 biopsy cores positive for malignancy with extensive extraprostatic extension and perineural invasion.   Family history: None.   Imaging studies:  Bone scan (04/12/19): Negative for metastatic disease.  CT abd/pelvis (04/12/19): 13 mm presacral lymph node and 9 mm suspicious left common iliac lymph node, no other evidence of metastatic disease   PMH: He has a history of diabetes and hypertension.  PSH: No abdominal surgeries.   TNM stage: cT3a N1 M0  PSA: 317  Gleason score: 4+4=8 (Grade group 4)  Biopsy (03/25/19): 12/12 cores positive  Left: L lateral apex (90%, 4+3=7), L apex (90%, 4+3=7), L lateral mid (90%, 4+4=8), L mid (95%, 4+3=7, PNI), L lateral base (95%, 4+3=7, PNI), L base (90%, 4+3=7, PNI)  Right: R apex (90%, 4+4=8), R lateral apex (95%, 4+4=8), R mid (95%, 4+3=7, PNI and EPE), R lateral mid (90%, 4+4=8), R base  (80%, 4+4=8, PNI), R lateral base (90%, 4+3=7, PNI and EPE)  Prostate volume: 71.5 cc   Nomogram  OC disease: 2%  EPE: 99%  SVI: 56%  LNI: 87%  PFS (5 year, 10 year): 7%, 4%   Urinary function: IPSS is 20. He was started on tamsulosin after his initial visit with Jonathan Austin. His IPSS prior to alpha-blocker therapy was 35.  Erectile function: SHIM score is 5.     ALLERGIES: No Allergies    MEDICATIONS: Hydrocodone-Acetaminophen 5 mg-325 mg tablet 1 tablet PO As Directed Take one hour prior to your scheduled prostate biopsy  Tamsulosin Hcl 0.4 mg capsule 1 capsule PO Daily  Acetaminophen 500 mg tablet Oral  Doxycycline Hyclate 100 mg capsule Oral  Insulin  Lisinopril 10 mg tablet Oral  Metformin Hcl 500 mg tablet Oral  Multivitamins tablet Oral     GU PSH: Drain Scrotal Wall Abscess - 2015 Prostate Needle Biopsy - 03/25/2019       PSH Notes: Surgery Scrotum Drainage Of Scrotal Wall Abscess Left, Foot Surgery, Tonsillectomy   NON-GU PSH: Remove Tonsils - 2015 Surgical Pathology, Gross And Microscopic Examination For Prostate Needle - 03/25/2019     GU PMH: Prostate Cancer - 04/01/2019 BPH w/LUTS - 03/14/2019 Elevated PSA - 03/14/2019 Encounter for Prostate Cancer screening - 03/14/2019 Urinary Urgency - 03/14/2019 Weak Urinary Stream - 03/14/2019 Scrotal abscess/inflammation, Abscess of scrotum - 2015    NON-GU PMH: Encounter for general adult medical examination without abnormal findings, Encounter for preventive health examination - 2015 Personal history of other diseases of the circulatory  system, History of hypertension - 2015 Personal history of other diseases of the nervous system and sense organs, History of sleep apnea - 2015 Personal history of other endocrine, nutritional and metabolic disease, History of diabetes mellitus - 2015 Anxiety Diabetes Type 2 Hypercholesterolemia Hypertension    FAMILY HISTORY: Diabetes - Runs In Family Hypertension - Runs In Family    SOCIAL HISTORY: Marital Status: Married Preferred Language: English; Race: Black or African American Current Smoking Status: Patient smokes. Smokes 1/2 pack per day.   Tobacco Use Assessment Completed: Used Tobacco in last 30 days? Drinks 12 drinks per week. Types of alcohol consumed: Beer.  Drinks 3 caffeinated drinks per day.     Notes: Alcohol use, Never a smoker, Mother alive and healthy, Father alive and healthy, Married, Occupation, Number of children, Caffeine use   REVIEW OF SYSTEMS:    GU Review Male:   Patient denies frequent urination, hard to postpone urination, burning/ pain with urination, get up at night to urinate, leakage of urine, stream starts and stops, trouble starting your streams, and have to strain to urinate .  Gastrointestinal (Lower):   Patient denies diarrhea and constipation.  Gastrointestinal (Upper):   Patient denies nausea and vomiting.  Constitutional:   Patient denies fever, night sweats, weight loss, and fatigue.  Skin:   Patient denies skin rash/ lesion and itching.  Eyes:   Patient denies blurred vision and double vision.  Ears/ Nose/ Throat:   Patient denies sore throat and sinus problems.  Hematologic/Lymphatic:   Patient denies swollen glands and easy bruising.  Cardiovascular:   Patient denies leg swelling and chest pains.  Respiratory:   Patient denies cough and shortness of breath.  Endocrine:   Patient denies excessive thirst.  Musculoskeletal:   Patient denies back pain and joint pain.  Neurological:   Patient denies headaches and dizziness.  Psychologic:   Patient denies anxiety and depression.   VITAL SIGNS: None   MULTI-SYSTEM PHYSICAL EXAMINATION:    Constitutional: Well-nourished. No physical deformities. Normally developed. Good grooming.     PAST DATA REVIEWED:  Source Of History:  Patient  Lab Test Review:   PSA  Records Review:   Pathology Reports, Previous Patient Records  X-Ray Review: C.T. Abdomen/Pelvis: Reviewed Films.   Bone Scan: Reviewed Films.     03/15/19  PSA  Total PSA 317.00 ng/mL   Notes:                     CLINICAL DATA: 54 year old with current history of prostate cancer  and an elevated PSA of 317 on 03/15/2019.   EXAM:  NUCLEAR MEDICINE WHOLE BODY BONE SCAN   TECHNIQUE:  Whole body anterior and posterior images were obtained approximately  3 hours after intravenous injection of radiopharmaceutical.   RADIOPHARMACEUTICALS: 21.6 mCi Technetium-9m MDP IV   COMPARISON: None.   FINDINGS:  No abnormal osseous activity within the axial or appendicular  skeleton to suggest metastatic disease. No significant degenerative  activity.   IMPRESSION:  No evidence of osseous metastatic disease.    Electronically Signed  By: Evangeline Dakin M.D.  On: 04/13/2019 16:12   CLINICAL DATA: Prostate cancer.   EXAM:  CT ABDOMEN AND PELVIS WITH CONTRAST   TECHNIQUE:  Multidetector CT imaging of the abdomen and pelvis was performed  using the standard protocol following bolus administration of  intravenous contrast.   CONTRAST: 154mL OMNIPAQUE IOHEXOL 300 MG/ML SOLN   COMPARISON: None.   FINDINGS:  Lower chest: Lung bases are  clear.   Hepatobiliary: No focal hepatic lesion. No biliary duct dilatation.  Gallbladder is normal. Common bile duct is normal.   Pancreas: Pancreas is normal. No ductal dilatation. No pancreatic  inflammation.   Spleen: Normal spleen   Adrenals/urinary tract: RIGHT adrenal gland is enlarged to 2.3 cm.  The gland demonstrates contrast washout characteristics consistent  benign adenoma. LEFT adrenal gland normal. Kidneys, ureters and  bladder normal. Bladder is mildly thick-walled but nondistended.   Stomach/Bowel: Stomach, small bowel, appendix, and cecum are normal.  The colon and rectosigmoid colon are normal.   Vascular/Lymphatic: Abdominal aorta normal caliber.   No retroperitoneal periaortic adenopathy.   There is enlarged lymph node in the  presacral space measuring 13 mm  (image 62/2). Enlarged LEFT common iliac lymph node measures 9 mm  (image 46/2)   Reproductive: Prostate gland is enlarged. Prostate gland measures  5.6 x 6.2 x 5.6 cm (volume = 100 cm^3). Seminal vesicles grossly  normal. No gross extracapsular extension   Other: No free fluid.   Musculoskeletal: No aggressive osseous lesion.   IMPRESSION:  1. Two lymph nodes in the pelvis are concerning for local prostate  cancer metastasis (one in the presacral space and a second at the  LEFT common iliac nodal station).  2. No evidence visceral metastatic disease or skeletal metastasis.  3. Prostatomegaly and mild bladder wall thickening.  4. RIGHT adrenal adenoma    Electronically Signed  By: Suzy Bouchard M.D.  On: 04/12/2019 17:44   PROCEDURES: None   ASSESSMENT:      ICD-10 Details  1 GU:   Prostate Cancer - C61   2   BPH w/LUTS - N40.1   3   Weak Urinary Stream - R39.12    PLAN:           Document Letter(s):  Created for Patient: Clinical Summary         Notes:   1. Locally advanced, lymph node positive prostate cancer: I had a detailed discussion with Mr. Beavers and his wife today. He has already been seen by Dr. Alen Blew and Dr. Tammi Klippel earlier this afternoon. We discussed the high likelihood that he has early metastatic disease and discussed options for management/treatment in this context. The consensus of the multidisciplinary Clinic was to proceed with systemic therapy with androgen deprivation and possibly escalated to abiraterone with definitive local therapy with radiation consistent with data of from the STAMPEDE trial. Although we discussed primary surgical therapy in the context of the PROTEUS trial, considering the fact that he almost certainly has early metastatic disease, I did not strongly advocate for this approach.   He does also have significant lower urinary tract symptoms which are moderately improved but still significant  following alpha-blocker therapy. This likely is related to his locally advanced prostate cancer. We discussed how proceeding with definitive treatment of his urinary symptoms prior to radiation therapy would be helpful with a procedure such as a channel TURP. In addition, he has a history of phimosis and is interested in proceeding with a circumcision at the same time.   Ultimately, he is due to have medical insurance on April 1st and would like to delay any treatment until that time. This seems appropriate. He can then begin androgen deprivation and plans to proceed with further treatment of his lower urinary tract symptoms prior to radiation treatment. I will plan to discuss this plan with Jonathan Austin and will allow him to coordinate systemic therapy with Dr. Alen Blew appropriately.  All questions were answered to the patient's satisfaction.   Cc: Dr. Zola Button  Dr. Tyler Pita  Dr. Ellison Austin        Next Appointment:      Next Appointment: 05/20/2019 03:30 PM    Appointment Type: Office Visit Established Patient    Location: [facility] [phone]    Provider: Ellison Austin, M.D.    Reason for Visit: 6 wk ov

## 2019-04-30 NOTE — Progress Notes (Signed)
Radiation Oncology         [phone] ________________________________  Multidisciplinary Prostate Cancer Clinic  Initial Radiation Oncology Consultation  Name: Jonathan Austin MRN: [id number]  Date: 04/30/2019  DOB: 12-21-1965  [facility]  [place], [place]*   [place] PHYSICIAN: Davis Gourd*  DIAGNOSIS: 54 y.o. gentleman with locally advanced, Stage T1c adenocarcinoma of the prostate with a Gleason's score of 4+4 and a PSA of 317    ICD-10-CM   1. Malignant neoplasm of prostate ([place])  [place] is a 54 y.o. gentleman.  He was noted to have an elevated PSA of 266 by his primary care physician.  Accordingly, he was referred for evaluation in urology by Dr. Lovena Neighbours on 03/14/2019.  Repeat PSA at that time was further elevated to 317 and he reported severe LUTS.  He was started on tamsulosin at that time and reports only mild improvement on medication. The patient proceeded to transrectal ultrasound with 12 biopsies of the prostate on 03/25/2019.  The prostate volume measured 71 cc.  Out of 12 core biopsies, all 12 were positive.  The maximum Gleason score was 4+4, and this was seen in the right apex lateral, right mid lateral, right apex, right base (with PNI), and left mid lateral. The remaining 7 cores showed Gleason 4+3, with extraprostatic extension and PNI noted in the right mid and right base lateral as well as Gleason 4+3 and PNI noted in the left base lateral, left base, and left mid.   Gleason 4+4=8   He had imaging for disease staging with CT A/P and bone scan on 04/12/19.  The CT demonstrated 2 suspicious lymph nodes in the pelvis with a 13 mm presacral node and a 9 mm left common iliac node concerning for local prostate cancer metastasis.  The bone scan was without evidence of any osseous metastatic disease.  The patient reviewed the biopsy results with his urologist and he has kindly been  referred today to the multidisciplinary prostate cancer clinic for presentation of pathology and radiology studies in our conference for discussion of potential radiation treatment options and clinical evaluation.  PREVIOUS RADIATION THERAPY: No  PAST MEDICAL HISTORY:  has a past medical history of Cellulitis (06/28/2013), Cocaine abuse ([place]) (06/28/2013), Cocaine abuse with cocaine-induced mood disorder ([place]), Depression, Diabetes mellitus, High cholesterol, Hypertension, Prostate cancer ([place]), Scrotal infection (06/28/2013), Sleep apnea, and Suicidal ideations.    PAST SURGICAL HISTORY: Past Surgical History:  Procedure Laterality Date  . PROSTATE BIOPSY    . TONSILLECTOMY      FAMILY HISTORY: family history includes Diabetes in his father; Hypertension in his father and mother.  SOCIAL HISTORY:  reports that he has never smoked. He has never used smokeless tobacco. He reports current alcohol use. He reports current drug use. Drugs: Cocaine and Marijuana.  ALLERGIES: Patient has no known allergies.  MEDICATIONS:  Current Outpatient Medications  Medication Sig Dispense Refill  . amLODipine (NORVASC) 10 MG tablet Take 1 tablet (10 mg total) by mouth daily. For high blood pressure 30 tablet 0  . aspirin 81 MG chewable tablet Chew 81 mg by mouth daily.    . fluconazole (DIFLUCAN) 150 MG tablet Take 150 mg by mouth once.    Marland Kitchen glipiZIDE (GLUCOTROL) 2.5 mg TABS tablet SMARTSIG:1 Tablet(s) By Mouth Daily    . glucose blood (FREESTYLE TEST STRIPS) test strip Use as instructed: For blood sugar monitoring 100 each 0  . glucose  monitoring kit (FREESTYLE) monitoring kit 1 each by Does not apply route 4 (four) times daily - after meals and at bedtime. 1 month Diabetic Testing Supplies for QAC - QHS: For accu checks. 1 each 1  . HM VITAMIN D3 100 MCG (4000 UT) CAPS Take 1 capsule by mouth daily.    Marland Kitchen HYDROcodone-acetaminophen (NORCO/VICODIN) 5-325 MG tablet Take 1 tablet by mouth as directed.    .  hydrocortisone cream 1 % apply to penis TID x 10 days    . hydrOXYzine (ATARAX/VISTARIL) 25 MG tablet Take 1 tablet (25 mg total) by mouth every 6 (six) hours as needed for anxiety. 60 tablet 0  . insulin glargine (LANTUS) 100 UNIT/ML injection Inject 0.06 mLs (6 Units total) into the skin at bedtime. For diabetes management 10 mL 0  . Lancets (FREESTYLE) lancets Use as instructed: For blood sugar checks 100 each 0  . LIPITOR 10 MG tablet Take 10 mg by mouth daily.    . metFORMIN (GLUCOPHAGE) 500 MG tablet Take 1 tablet (500 mg total) by mouth 2 (two) times daily with a meal. For diabetes management 60 tablet 0  . naltrexone (DEPADE) 50 MG tablet Take 1 tablet (50 mg total) by mouth daily. For alcoholism 30 tablet 0  . NORVASC 5 MG tablet Take 5 mg by mouth at bedtime.    . Prenatal Vit-Fe Fumarate-FA (PRENATAL MULTIVITAMIN) TABS tablet Take 1 tablet by mouth daily at 12 noon. (May buy from over the counter): Vitamin supplement    . traZODone (DESYREL) 50 MG tablet Take 1 tablet (50 mg total) by mouth at bedtime as needed for sleep. 30 tablet 0   No current facility-administered medications for this encounter.    REVIEW OF SYSTEMS:  On review of systems, the patient reports that he is doing well overall. He denies any chest pain, shortness of breath, cough, fevers, chills, night sweats, unintended weight changes. He denies any bowel disturbances, and denies abdominal pain, nausea or vomiting. He denies any new musculoskeletal or joint aches or pains. His IPSS was 22, indicating severe urinary symptoms. He reports urinary frequency, intermittency, urgency, and nocturia x5. His SHIM was 5, indicating he has severe erectile dysfunction. A complete review of systems is obtained and is otherwise negative.   PHYSICAL EXAM:  Wt Readings from Last 3 Encounters:  01/22/18 200 lb (90.7 kg)  12/14/17 195 lb (88.5 kg)  06/23/17 225 lb (102.1 kg)   Temp Readings from Last 3 Encounters:  01/24/18 98 F  (36.7 C) (Oral)  12/16/17 98.6 F (37 C)  06/23/17 98.9 F (37.2 C) (Oral)   BP Readings from Last 3 Encounters:  01/24/18 109/77  12/16/17 (!) 133/95  06/23/17 (!) 150/89   Pulse Readings from Last 3 Encounters:  01/24/18 95  12/16/17 86  06/23/17 (!) 128    /10  In general this is a well appearing Caucasian male in no acute distress. He is alert and oriented x4 and appropriate throughout the examination. HEENT reveals that the patient is normocephalic, atraumatic. EOMs are intact. PERRLA. Skin is intact without any evidence of gross lesions. Cardiovascular exam reveals a regular rate and rhythm, no clicks rubs or murmurs are auscultated. Chest is clear to auscultation bilaterally. Lymphatic assessment is performed and does not reveal any adenopathy in the cervical, supraclavicular, axillary, or inguinal chains. Abdomen has active bowel sounds in all quadrants and is intact. The abdomen is soft, non tender, non distended. Lower extremities are negative for pretibial pitting edema, deep  calf tenderness, cyanosis or clubbing.  KPS = 90  100 - Normal; no complaints; no evidence of disease. 90   - Able to carry on normal activity; minor signs or symptoms of disease. 80   - Normal activity with effort; some signs or symptoms of disease. 74   - Cares for self; unable to carry on normal activity or to do active work. 60   - Requires occasional assistance, but is able to care for most of his personal needs. 50   - Requires considerable assistance and frequent medical care. 16   - Disabled; requires special care and assistance. 18   - Severely disabled; hospital admission is indicated although death not imminent. 37   - Very sick; hospital admission necessary; active supportive treatment necessary. 10   - Moribund; fatal processes progressing rapidly. 0     - Dead  Karnofsky DA, Abelmann [place], Craver LS and Burchenal JH [phone]) The use of the nitrogen mustards in the palliative treatment of  carcinoma: with particular reference to bronchogenic carcinoma Cancer 1 [id number]   LABORATORY DATA:  Lab Results  Component Value Date   WBC 6.8 01/22/2018   HGB 16.0 01/22/2018   HCT 47.0 01/22/2018   MCV 89.5 01/22/2018   PLT 355 01/22/2018   Lab Results  Component Value Date   NA 132 (L) 01/22/2018   K 4.8 01/22/2018   CL 94 (L) 01/22/2018   CO2 22 01/22/2018   Lab Results  Component Value Date   ALT 25 01/22/2018   AST 24 01/22/2018   ALKPHOS 58 01/22/2018   BILITOT 0.8 01/22/2018     RADIOGRAPHY: NM Bone Scan Whole Body  Result Date: 04/13/2019 CLINICAL DATA:  54 year old with current history of prostate cancer and an elevated PSA of 317 on 03/15/2019. EXAM: NUCLEAR MEDICINE WHOLE BODY BONE SCAN TECHNIQUE: Whole body anterior and posterior images were obtained approximately 3 hours after intravenous injection of radiopharmaceutical. RADIOPHARMACEUTICALS:  21.6 mCi Technetium-14mMDP IV COMPARISON:  None. FINDINGS: No abnormal osseous activity within the axial or appendicular skeleton to suggest metastatic disease. No significant degenerative activity. IMPRESSION: No evidence of osseous metastatic disease. Electronically Signed   By: TEvangeline DakinM.D.   On: 04/13/2019 16:12   CT ABDOMEN PELVIS W CONTRAST  Result Date: 04/12/2019 CLINICAL DATA:  Prostate cancer. EXAM: CT ABDOMEN AND PELVIS WITH CONTRAST TECHNIQUE: Multidetector CT imaging of the abdomen and pelvis was performed using the standard protocol following bolus administration of intravenous contrast. CONTRAST:  102mOMNIPAQUE IOHEXOL 300 MG/ML  SOLN COMPARISON:  None. FINDINGS: Lower chest: Lung bases are clear. Hepatobiliary: No focal hepatic lesion. No biliary duct dilatation. Gallbladder is normal. Common bile duct is normal. Pancreas: Pancreas is normal. No ductal dilatation. No pancreatic inflammation. Spleen: Normal spleen Adrenals/urinary tract: RIGHT adrenal gland is enlarged to 2.3 cm. The gland demonstrates  contrast washout characteristics consistent benign adenoma. LEFT adrenal gland normal. Kidneys, ureters and bladder normal. Bladder is mildly thick-walled but nondistended. Stomach/Bowel: Stomach, small bowel, appendix, and cecum are normal. The colon and rectosigmoid colon are normal. Vascular/Lymphatic: Abdominal aorta normal caliber. No retroperitoneal periaortic adenopathy. There is enlarged lymph node in the presacral space measuring 13 mm (image 62/2). Enlarged LEFT common iliac lymph node measures 9 mm (image 46/2) Reproductive: Prostate gland is enlarged. Prostate gland measures 5.6 x 6.2 x 5.6 cm (volume = 100 cm^3). Seminal vesicles grossly normal. No gross extracapsular extension Other: No free fluid. Musculoskeletal: No aggressive osseous lesion. IMPRESSION: 1. Two lymph nodes  in the pelvis are concerning for local prostate cancer metastasis (one in the presacral space and a second at the LEFT common iliac nodal station). 2. No evidence visceral metastatic disease or skeletal metastasis. 3. Prostatomegaly and mild bladder wall thickening. 4. RIGHT adrenal adenoma Electronically Signed   By: Suzy Bouchard M.D.   On: 04/12/2019 17:44      IMPRESSION/PLAN: 54 y.o. gentleman with locally advanced, stage T1c adenocarcinoma of the prostate with a PSA of 317 and a Gleason score of 4+4.    We discussed the patient's workup and outlined the nature of prostate cancer in this setting. The patient's T stage, Gleason's score, and PSA put him into the high risk group and he has been noted to have local metastasis in a presacral and left common iliac nodes. Accordingly, he is eligible for a variety of potential treatment options including prostatectomy or LT-ADT in combination with either 8 weeks of external radiation or 5 weeks of external radiation combined with a brachytherapy boost. We discussed the available radiation techniques, and focused on the details and logistics and delivery. The patient is not an  ideal candidate for brachytherapy boost given his severe urinary symptoms despite medication. He may also not be a good candidate for prostatectomy given the volume of disease, as this would most likely be in the setting of multi-modal therapy, likely in addition to XRT +/- ADT but he will discuss this further with Dr. Alinda Money this afternoon as part of [place].  They will also discuss the potential role for TURP to improve his LUTS prior to proceeding with radiotherapy.  We discussed and outlined the risks, benefits, short and long-term effects associated with radiotherapy. We discussed the role of SpaceOAR in reducing the rectal toxicity associated with radiotherapy. We also detailed the role of ADT in the treatment of high risk, locally advanced prostate cancer and outlined the associated side effects that could be expected with this therapy. He and his wife Tamela Oddi were encouraged to ask questions that were answered to their stated satisfaction.  At the end of the conversation the patient is interested in moving forward with 8 weeks of external beam therapy in combination with LT-ADT. We will share our discussion with Dr. Lovena Neighbours and make arrangements for start of ADT now and proceed with coordinating for fiducial markers and SpaceOAR gel placement, prior to simulation in May 2021, to reduce rectal toxicity from radiotherapy. The patient appears to have a good understanding of his disease and our treatment recommendations which are of curative intent and is in agreement with the stated plan.  Therefore, we will move forward with treatment planning accordingly, in anticipation of beginning IMRT in late May 2021,approximately 2 months after starting ADT.     Nicholos Johns, PA-C    Tyler Pita, MD  [facility] Direct Dial: [phone]  Fax: [phone] [email or web address]  Skype  [email or web address]   This document serves as a record of services personally performed by Tyler Pita, MD  and Freeman Caldron, PA-C. It was created on their behalf by Wilburn Mylar, a trained medical scribe. The creation of this record is based on the scribe's personal observations and the provider's statements to them. This document has been checked and approved by the attending provider.

## 2019-04-30 NOTE — Progress Notes (Signed)
                               Care Plan Summary  Name: Mr. Zoraiz Negley DOB: [birth year]   Your Medical Team:   Urologist -  Dr. Raynelle Bring, [facility]  Radiation Oncologist - Dr. Tyler Pita, [facility]   Medical Oncologist - Dr. Zola Button, [place]  Recommendations: 1)  Androgen deprivation with radiation  2)  TURP  3)  Radiation   * These recommendations are based on information available as of today's consult.      Recommendations may change depending on the results of further tests or exams.  Next Steps: 1)  Dr. Jackson Latino office will schedule hormone injection    When appointments need to be scheduled, you will be contacted by [facility] and/or [facility].  Questions?  Please do not hesitate to call [name], RN, BSN, OCN at [phone] any questions or concerns.  Shirlean Mylar is your Oncology Nurse Navigator and is available to assist you while you're receiving your medical care at [facility].

## 2019-04-30 NOTE — Progress Notes (Signed)
Reason for the request:    Prostate cancer  HPI: I was asked by Dr. Lovena Neighbours to evaluate Jonathan Austin for the evaluation of prostate cancer.  He is a 54 year old man with history of diabetes, hypertension and hyperlipidemia.  He is started developing lower urinary tract symptoms and was found to have an elevated PSA of 266.  Based on these findings he was referred to Dr. Lovena Neighbours and subsequently underwent a prostate biopsy on March 25, 2019.  The biopsy should be high volume disease with a Gleason score of 4+4 = 8 and at least 3 cores with 9 other cores showed Gleason score 4+3 = 7.  Staging work-up including CT scan of the abdomen and pelvis showed 2 lymph nodes in the pelvis in the presacral space measuring 13 mm as well as an enlarged left common iliac lymph node measuring 9 mm.  No evidence of visceral metastasis or other lymphadenopathy.  Bone scan is 1 obtained April 12, 2019 showed no evidence of disease.  Clinically, he continues to have urinary frequency, urgency as well as nocturia despite being on tamsulosin.  He remains active and continues to work full-time without any decline in his ability to do so.  His performance status quality of life remain excellent.  He does not report any headaches, blurry vision, syncope or seizures. Does not report any fevers, chills or sweats.  Does not report any cough, wheezing or hemoptysis.  Does not report any chest pain, palpitation, orthopnea or leg edema.  Does not report any nausea, vomiting or abdominal pain.  Does not report any constipation or diarrhea.  Does not report any skeletal complaints.    Does not report frequency, urgency or hematuria.  Does not report any skin rashes or lesions. Does not report any heat or cold intolerance.  Does not report any lymphadenopathy or petechiae.  Does not report any anxiety or depression.  Remaining review of systems is negative.    Past Medical History:  Diagnosis Date  . Cellulitis 06/28/2013  . Cocaine abuse  ([place]) 06/28/2013  . Cocaine abuse with cocaine-induced mood disorder ([place])   . Depression   . Diabetes mellitus   . High cholesterol   . Hypertension   . Prostate cancer ([place])   . Scrotal infection 06/28/2013  . Sleep apnea   . Suicidal ideations   :  Past Surgical History:  Procedure Laterality Date  . PROSTATE BIOPSY    . TONSILLECTOMY    :   Current Outpatient Medications:  .  amLODipine (NORVASC) 10 MG tablet, Take 1 tablet (10 mg total) by mouth daily. For high blood pressure, Disp: 30 tablet, Rfl: 0 .  aspirin 81 MG chewable tablet, Chew 81 mg by mouth daily., Disp: , Rfl:  .  fluconazole (DIFLUCAN) 150 MG tablet, Take 150 mg by mouth once., Disp: , Rfl:  .  glipiZIDE (GLUCOTROL) 2.5 mg TABS tablet, SMARTSIG:1 Tablet(s) By Mouth Daily, Disp: , Rfl:  .  glucose blood (FREESTYLE TEST STRIPS) test strip, Use as instructed: For blood sugar monitoring, Disp: 100 each, Rfl: 0 .  glucose monitoring kit (FREESTYLE) monitoring kit, 1 each by Does not apply route 4 (four) times daily - after meals and at bedtime. 1 month Diabetic Testing Supplies for QAC - QHS: For accu checks., Disp: 1 each, Rfl: 1 .  HM VITAMIN D3 100 MCG (4000 UT) CAPS, Take 1 capsule by mouth daily., Disp: , Rfl:  .  HYDROcodone-acetaminophen (NORCO/VICODIN) 5-325 MG tablet, Take 1 tablet by  mouth as directed., Disp: , Rfl:  .  hydrocortisone cream 1 %, apply to penis TID x 10 days, Disp: , Rfl:  .  hydrOXYzine (ATARAX/VISTARIL) 25 MG tablet, Take 1 tablet (25 mg total) by mouth every 6 (six) hours as needed for anxiety., Disp: 60 tablet, Rfl: 0 .  insulin glargine (LANTUS) 100 UNIT/ML injection, Inject 0.06 mLs (6 Units total) into the skin at bedtime. For diabetes management, Disp: 10 mL, Rfl: 0 .  Lancets (FREESTYLE) lancets, Use as instructed: For blood sugar checks, Disp: 100 each, Rfl: 0 .  LIPITOR 10 MG tablet, Take 10 mg by mouth daily., Disp: , Rfl:  .  metFORMIN (GLUCOPHAGE) 500 MG tablet, Take 1 tablet (500  mg total) by mouth 2 (two) times daily with a meal. For diabetes management, Disp: 60 tablet, Rfl: 0 .  naltrexone (DEPADE) 50 MG tablet, Take 1 tablet (50 mg total) by mouth daily. For alcoholism, Disp: 30 tablet, Rfl: 0 .  NORVASC 5 MG tablet, Take 5 mg by mouth at bedtime., Disp: , Rfl:  .  Prenatal Vit-Fe Fumarate-FA (PRENATAL MULTIVITAMIN) TABS tablet, Take 1 tablet by mouth daily at 12 noon. (May buy from over the counter): Vitamin supplement, Disp: , Rfl:  .  traZODone (DESYREL) 50 MG tablet, Take 1 tablet (50 mg total) by mouth at bedtime as needed for sleep., Disp: 30 tablet, Rfl: 0:  No Known Allergies:  Family History  Problem Relation Age of Onset  . Hypertension Mother   . Hypertension Father   . Diabetes Father   . Prostate cancer Neg Hx   . Breast cancer Neg Hx   . Colon cancer Neg Hx   . Pancreatic cancer Neg Hx   :  Social History   Socioeconomic History  . Marital status: Married    Spouse name: Not on file  . Number of children: Not on file  . Years of education: Not on file  . Highest education level: Not on file  Occupational History  . Not on file  Tobacco Use  . Smoking status: Never Smoker  . Smokeless tobacco: Never Used  Substance and Sexual Activity  . Alcohol use: Yes    Comment: drinks beer 40 oz x 2 daily  . Drug use: Yes    Types: Cocaine, Marijuana    Comment: smokes 2g crack daily   . Sexual activity: Yes  Other Topics Concern  . Not on file  Social History Narrative  . Not on file   Social Determinants of Health   Financial Resource Strain:   . Difficulty of Paying Living Expenses:   Food Insecurity:   . Worried About Charity fundraiser in the Last Year:   . Arboriculturist in the Last Year:   Transportation Needs:   . Film/video editor (Medical):   Marland Kitchen Lack of Transportation (Non-Medical):   Physical Activity:   . Days of Exercise per Week:   . Minutes of Exercise per Session:   Stress:   . Feeling of Stress :   Social  Connections:   . Frequency of Communication with Friends and Family:   . Frequency of Social Gatherings with Friends and Family:   . Attends Religious Services:   . Active Member of Clubs or Organizations:   . Attends Archivist Meetings:   Marland Kitchen Marital Status:   Intimate Partner Violence:   . Fear of Current or Ex-Partner:   . Emotionally Abused:   Marland Kitchen Physically Abused:   .  Sexually Abused:   :    Exam: ECOG 0 General appearance: alert and cooperative appeared without distress. Head: atraumatic without any abnormalities. Eyes: conjunctivae/corneas clear. PERRL.  Sclera anicteric. Throat: lips, mucosa, and tongue normal; without oral thrush or ulcers. Resp: clear to auscultation bilaterally without rhonchi, wheezes or dullness to percussion. Cardio: regular rate and rhythm, S1, S2 normal, no murmur, click, rub or gallop GI: soft, non-tender; bowel sounds normal; no masses,  no organomegaly Skin: Skin color, texture, turgor normal. No rashes or lesions Lymph nodes: Cervical, supraclavicular, and axillary nodes normal. Neurologic: Grossly normal without any motor, sensory or deep tendon reflexes. Musculoskeletal: No joint deformity or effusion.    NM Bone Scan Whole Body  Result Date: 04/13/2019 CLINICAL DATA:  53 year old with current history of prostate cancer and an elevated PSA of 317 on 03/15/2019. EXAM: NUCLEAR MEDICINE WHOLE BODY BONE SCAN TECHNIQUE: Whole body anterior and posterior images were obtained approximately 3 hours after intravenous injection of radiopharmaceutical. RADIOPHARMACEUTICALS:  21.6 mCi Technetium-3mMDP IV COMPARISON:  None. FINDINGS: No abnormal osseous activity within the axial or appendicular skeleton to suggest metastatic disease. No significant degenerative activity. IMPRESSION: No evidence of osseous metastatic disease. Electronically Signed   By: TEvangeline DakinM.D.   On: 04/13/2019 16:12   CT ABDOMEN PELVIS W CONTRAST  Result Date:  04/12/2019 CLINICAL DATA:  Prostate cancer. EXAM: CT ABDOMEN AND PELVIS WITH CONTRAST TECHNIQUE: Multidetector CT imaging of the abdomen and pelvis was performed using the standard protocol following bolus administration of intravenous contrast. CONTRAST:  1064mOMNIPAQUE IOHEXOL 300 MG/ML  SOLN COMPARISON:  None. FINDINGS: Lower chest: Lung bases are clear. Hepatobiliary: No focal hepatic lesion. No biliary duct dilatation. Gallbladder is normal. Common bile duct is normal. Pancreas: Pancreas is normal. No ductal dilatation. No pancreatic inflammation. Spleen: Normal spleen Adrenals/urinary tract: RIGHT adrenal gland is enlarged to 2.3 cm. The gland demonstrates contrast washout characteristics consistent benign adenoma. LEFT adrenal gland normal. Kidneys, ureters and bladder normal. Bladder is mildly thick-walled but nondistended. Stomach/Bowel: Stomach, small bowel, appendix, and cecum are normal. The colon and rectosigmoid colon are normal. Vascular/Lymphatic: Abdominal aorta normal caliber. No retroperitoneal periaortic adenopathy. There is enlarged lymph node in the presacral space measuring 13 mm (image 62/2). Enlarged LEFT common iliac lymph node measures 9 mm (image 46/2) Reproductive: Prostate gland is enlarged. Prostate gland measures 5.6 x 6.2 x 5.6 cm (volume = 100 cm^3). Seminal vesicles grossly normal. No gross extracapsular extension Other: No free fluid. Musculoskeletal: No aggressive osseous lesion. IMPRESSION: 1. Two lymph nodes in the pelvis are concerning for local prostate cancer metastasis (one in the presacral space and a second at the LEFT common iliac nodal station). 2. No evidence visceral metastatic disease or skeletal metastasis. 3. Prostatomegaly and mild bladder wall thickening. 4. RIGHT adrenal adenoma Electronically Signed   By: StSuzy Bouchard.D.   On: 04/12/2019 17:44    Assessment and Plan:   [age]ear old man with:  1. Prostate cancer diagnosed in February 2021.  He  presented with a PSA of 266 and a Gleason score of 4+4 = 8 with high-volume disease in 12 cores.  Staging work-up did not show any evidence of widespread metastasis but borderline enlarged presacral space pelvic lymph node measuring 13 mm with a 9 mm iliac lymph node.  The natural course of this disease was reviewed today with the patient and his wife.  He is case was discussed in the prostate cancer multidisciplinary clinic including review of his imaging studies  with radiology and review of his biopsy specimen with the reviewing pathologist.  Given his high PSA it is reasonable to assume that he has metastatic disease although not high-volume disease noted at this time.  Treatment options at this time were reviewed and androgen deprivation therapy remains the cornerstone of treating this disease at this time.  Complication associated with androgen deprivation were discussed.  He is will include hot flashes, weight gain and sexual dysfunction.  His treatment should start in the immediate future.  Therapy escalation with androgen synthesis inhibitor such as abiraterone or androgen receptor blockade such as enzalutamide could be considered as well given his high risk high-volume disease at this time.  The rationale for treating local disease was discussed at this time.  Given the fact that he has no clear-cut distant metastasis at this time definitive therapy for his primary prostate would be reasonable at this time.  Discussed the treatment options with Dr. Tammi Klippel at this time.  2.  Lower urinary tract symptoms: I have recommended proceeding with androgen deprivation therapy initially which likely will help his symptoms down the line and help reduce the size of his prostate and limit the complications associated with radiation as well.  3.  Follow-up: I am happy to see him in the near future to start abiraterone or enzalutamide after starting androgen deprivation therapy.  I will leave that option to  Dr. Lovena Neighbours.  45  minutes were dedicated to this visit. The time was spent on reviewing laboratory data, imaging studies, discussing treatment options,  and answering questions regarding future plan.    A copy of this consult has been forwarded to the requesting physician.

## 2019-05-03 ENCOUNTER — Encounter (HOSPITAL_COMMUNITY): Payer: Self-pay | Admitting: Psychiatry

## 2019-05-03 ENCOUNTER — Other Ambulatory Visit: Payer: Self-pay

## 2019-05-03 ENCOUNTER — Observation Stay (HOSPITAL_COMMUNITY)
Admission: RE | Admit: 2019-05-03 | Discharge: 2019-05-04 | Disposition: A | Discharge status: Other Acute Inpatient Hospital | Payer: Self-pay | Attending: Psychiatry | Admitting: Psychiatry

## 2019-05-03 ENCOUNTER — Telehealth: Payer: Self-pay | Admitting: *Deleted

## 2019-05-03 DIAGNOSIS — F329 Major depressive disorder, single episode, unspecified: Secondary | ICD-10-CM | POA: Insufficient documentation

## 2019-05-03 DIAGNOSIS — Z7982 Long term (current) use of aspirin: Secondary | ICD-10-CM | POA: Insufficient documentation

## 2019-05-03 DIAGNOSIS — Z794 Long term (current) use of insulin: Secondary | ICD-10-CM | POA: Insufficient documentation

## 2019-05-03 DIAGNOSIS — R45851 Suicidal ideations: Principal | ICD-10-CM | POA: Insufficient documentation

## 2019-05-03 DIAGNOSIS — E785 Hyperlipidemia, unspecified: Secondary | ICD-10-CM | POA: Insufficient documentation

## 2019-05-03 DIAGNOSIS — C61 Malignant neoplasm of prostate: Secondary | ICD-10-CM | POA: Insufficient documentation

## 2019-05-03 DIAGNOSIS — Z8249 Family history of ischemic heart disease and other diseases of the circulatory system: Secondary | ICD-10-CM | POA: Insufficient documentation

## 2019-05-03 DIAGNOSIS — Z20822 Contact with and (suspected) exposure to covid-19: Secondary | ICD-10-CM | POA: Insufficient documentation

## 2019-05-03 DIAGNOSIS — E7800 Pure hypercholesterolemia, unspecified: Secondary | ICD-10-CM | POA: Insufficient documentation

## 2019-05-03 DIAGNOSIS — E119 Type 2 diabetes mellitus without complications: Secondary | ICD-10-CM | POA: Insufficient documentation

## 2019-05-03 DIAGNOSIS — I10 Essential (primary) hypertension: Secondary | ICD-10-CM | POA: Insufficient documentation

## 2019-05-03 DIAGNOSIS — F1994 Other psychoactive substance use, unspecified with psychoactive substance-induced mood disorder: Secondary | ICD-10-CM | POA: Diagnosis present

## 2019-05-03 DIAGNOSIS — Z79899 Other long term (current) drug therapy: Secondary | ICD-10-CM | POA: Insufficient documentation

## 2019-05-03 DIAGNOSIS — F332 Major depressive disorder, recurrent severe without psychotic features: Secondary | ICD-10-CM | POA: Diagnosis present

## 2019-05-03 DIAGNOSIS — F1410 Cocaine abuse, uncomplicated: Secondary | ICD-10-CM | POA: Insufficient documentation

## 2019-05-03 LAB — COMPREHENSIVE METABOLIC PANEL
ALT: 26 U/L (ref 0–44)
AST: 23 U/L (ref 15–41)
Albumin: 3.8 g/dL (ref 3.5–5.0)
Alkaline Phosphatase: 46 U/L (ref 38–126)
Anion gap: 11 (ref 5–15)
BUN: 12 mg/dL (ref 6–20)
CO2: 24 mmol/L (ref 22–32)
Calcium: 9 mg/dL (ref 8.9–10.3)
Chloride: 99 mmol/L (ref 98–111)
Creatinine, Ser: 1.02 mg/dL (ref 0.61–1.24)
GFR calc Af Amer: >60 mL/min (ref >60)
GFR calc non Af Amer: >60 mL/min (ref >60)
Glucose, Bld: 236 mg/dL — ABNORMAL HIGH (ref 70–99)
Potassium: 4.1 mmol/L (ref 3.5–5.1)
Sodium: 134 mmol/L — ABNORMAL LOW (ref 135–145)
Total Bilirubin: 1 mg/dL (ref 0.3–1.2)
Total Protein: 7.2 g/dL (ref 6.5–8.1)

## 2019-05-03 LAB — CBC
HCT: 46.6 % (ref 39.0–52.0)
Hemoglobin: 15.4 g/dL (ref 13.0–17.0)
MCH: 30.1 pg (ref 26.0–34.0)
MCHC: 33 g/dL (ref 30.0–36.0)
MCV: 91.2 fL (ref 80.0–100.0)
Platelets: 329 10*3/uL (ref 150–400)
RBC: 5.11 MIL/uL (ref 4.22–5.81)
RDW: 13.3 % (ref 11.5–15.5)
WBC: 7.2 10*3/uL (ref 4.0–10.5)
nRBC: 0 % (ref 0.0–0.2)

## 2019-05-03 LAB — ETHANOL: Alcohol, Ethyl (B): <10 mg/dL (ref <10)

## 2019-05-03 LAB — ACETAMINOPHEN LEVEL: Acetaminophen (Tylenol), Serum: <10 ug/mL — ABNORMAL LOW (ref 10–30)

## 2019-05-03 LAB — SALICYLATE LEVEL: Salicylate Lvl: <7 mg/dL — ABNORMAL LOW (ref 7.0–30.0)

## 2019-05-03 NOTE — H&P (Signed)
Behavioral Health Medical Screening Exam  Jonathan Austin is an 54 y.o. male who voluntarily presented to [facility] [place] as a walk-in due to worsening depression, suicidal ideations with a plan to overdose. He reports daily use of cocaine and alcohol and is requesting assistance with substance abuse treatment. He reports that he was recently diagnosed with prostate cancer and will start treatment next week.   Total Time spent with patient: 20 minutes  Psychiatric Specialty Exam: Physical Exam  Constitutional: He is oriented to person, place, and time. He appears well-developed and well-nourished. No distress.  HENT:  Head: Normocephalic.  Eyes: Pupils are equal, round, and reactive to light. Right eye exhibits no discharge. Left eye exhibits no discharge.  Respiratory: Effort normal. No respiratory distress.  Musculoskeletal:        General: Normal range of motion.  Neurological: He is alert and oriented to person, place, and time.  Skin: Skin is warm and dry. He is not diaphoretic.  Psychiatric: His mood appears anxious. He is not withdrawn and not actively hallucinating. Thought content is not paranoid and not delusional. He exhibits a depressed mood. He expresses suicidal ideation. He expresses no homicidal ideation. He expresses suicidal plans.    Review of Systems  Constitutional: Negative for activity change, appetite change, chills, diaphoresis, fatigue, fever and unexpected weight change.  Respiratory: Negative for cough and shortness of breath.   Gastrointestinal: Negative for diarrhea, nausea and vomiting.  Genitourinary: Positive for difficulty urinating and frequency. Negative for dysuria and hematuria.  Musculoskeletal: Negative.   Skin: Negative.   Neurological: Negative for dizziness.  Psychiatric/Behavioral: Positive for decreased concentration, dysphoric mood, sleep disturbance and suicidal ideas. Negative for hallucinations and self-injury. The patient is nervous/anxious. The patient  is not hyperactive.   All other systems reviewed and are negative.   Blood pressure 133/90, pulse (!) 108, temperature 98 F (36.7 C), temperature source Oral, resp. rate 16, SpO2 97 %.There is no height or weight on file to calculate BMI.  General Appearance: Casual and Fairly Groomed  Eye Contact:  Fair  Speech:  Clear and Coherent and Normal Rate  Volume:  Decreased  Mood:  Anxious, Depressed and Hopeless  Affect:  Congruent and Depressed  Thought Process:  Coherent, Goal Directed, Linear and Descriptions of Associations: Intact  Orientation:  Full (Time, Place, and Person)  Thought Content:  Logical and Hallucinations: None  Suicidal Thoughts:  Yes.  with intent/plan  Homicidal Thoughts:  No  Memory:  Immediate;   Fair Recent;   Fair Remote;   Fair  Judgement:  Impaired  Insight:  Lacking  Psychomotor Activity:  Normal  Concentration: Concentration: Fair and Attention Span: Fair  Recall:  [employer] of Knowledge:Good  Language: Good  Akathisia:  Negative  Handed:  Right  AIMS (if indicated):     Assets:  Communication Skills Desire for Improvement  Sleep:       Musculoskeletal: Strength & Muscle Tone: within normal limits Gait & Station: normal Patient leans: N/A  Blood pressure 133/90, pulse (!) 108, temperature 98 F (36.7 C), temperature source Oral, resp. rate 16, SpO2 97 %.  Recommendations:  Based on my evaluation the patient does not appear to have an emergency medical condition.   Disposition: Due to medical history, patient will need medical clearnace prior to placement.  Rozetta Nunnery, NP 05/03/2019, 10:19 PM

## 2019-05-03 NOTE — ED Notes (Signed)
Requested urine on patient.

## 2019-05-03 NOTE — ED Triage Notes (Signed)
Patient is stating that he tried to kill himself today. Patient brought from behavorial Health.

## 2019-05-03 NOTE — BH Assessment (Signed)
Assessment Note  Jonathan Austin is an 54 y.o. male. Pt presents to [facility] as a walk in voluntarily unaccompanied for active suicidal thoughts and depression. Pt states that he has been abusing crack/cocaine all day and has spent a total of $600.00 on the substance in combination with alcohol. Pt states he also drank a 12 pack of beer as well today. Pt states he abuses both substances daily, has been ongoing issue last few years. Pt reports active suicidal thoughts of cutting self, overdosing on crack or wrecking car. Pt states that today is the anniversary of his son's death, his son died 6 years ago and today is also his birthday. Pt states he gets depressed around this time every year. Pt also reports multiple stressors including finances, being diagnosed with prostate cancer a few months ago and recently relapsing on crack from Dec 2020 up until now he was clean for a year. Pt reports several SI attempts spanning from 2012 up until 2020, including by overdose of medications and cutting. Pt reports to recent self injurious behaviors. Pt currently denies HI, AVH as well. Pt reports current;y getting 5 hours of sleep daily and a poor appetite. Pt states he has sleep apnea. Pt reports symptoms of depression has been long term: hopelessness, worthlessness, irritability, anxiety, isolation, sadness, tearfulness. Pt reports family history of SI, no trauma/substance use. Pt reports current provider at [facility], see Joycelyn Schmid and Dr Steele(psychtrist), has seen them last 4 years. Pt states he is not taking any medications and does not like to. Pt last psychatrically  hospitalized  01/2018. Pt can not contract for safety at this time, states he does not know what he will do if he discharges.    Pt dress casulay He is alert and oriented x4. Pt speaks in a clear tone, at moderate volume and normal pace. Eye contact is good. Pt's mood is depressed, affect congruent. Thought process is coherent and  relevant. There is no indication Pt is currently responding to internal stimuli or experiencing delusional thought content.?Pt could not contract for safety at this time.    Diagnosis:  F33.2 Major depressive disorder, Recurrent episode, Severe F14.20 Cocaine use disorder, Severe F10.20 Alcohol use disorder, Severe  Past Medical History:  Past Medical History:  Diagnosis Date  . Cellulitis 06/28/2013  . Cocaine abuse ([place]) 06/28/2013  . Cocaine abuse with cocaine-induced mood disorder ([place])   . Depression   . Diabetes mellitus   . High cholesterol   . Hypertension   . Prostate cancer ([place])   . Scrotal infection 06/28/2013  . Sleep apnea   . Suicidal ideations     Past Surgical History:  Procedure Laterality Date  . PROSTATE BIOPSY    . TONSILLECTOMY      Family History:  Family History  Problem Relation Age of Onset  . Hypertension Mother   . Hypertension Father   . Diabetes Father   . Prostate cancer Maternal Grandfather 96  . Breast cancer Neg Hx   . Colon cancer Neg Hx   . Pancreatic cancer Neg Hx     Social History:  reports that he has never smoked. He has never used smokeless tobacco. He reports current alcohol use. He reports current drug use. Drugs: Cocaine and Marijuana.  Additional Social History:     CIWA: CIWA-Ar BP: 133/90 Pulse Rate: (!) 108 COWS:    Allergies: No Known Allergies  Home Medications:  Medications Prior to Admission  Medication Sig Dispense Refill  .  amLODipine (NORVASC) 10 MG tablet Take 1 tablet (10 mg total) by mouth daily. For high blood pressure 30 tablet 0  . aspirin 81 MG chewable tablet Chew 81 mg by mouth daily.    Marland Kitchen glipiZIDE (GLUCOTROL) 2.5 mg TABS tablet SMARTSIG:1 Tablet(s) By Mouth Daily    . glucose blood (FREESTYLE TEST STRIPS) test strip Use as instructed: For blood sugar monitoring 100 each 0  . glucose monitoring kit (FREESTYLE) monitoring kit 1 each by Does not apply route 4 (four) times daily - after meals and  at bedtime. 1 month Diabetic Testing Supplies for QAC - QHS: For accu checks. 1 each 1  . HM VITAMIN D3 100 MCG (4000 UT) CAPS Take 1 capsule by mouth daily.    . hydrocortisone cream 1 % apply to penis TID x 10 days    . hydrOXYzine (ATARAX/VISTARIL) 25 MG tablet Take 1 tablet (25 mg total) by mouth every 6 (six) hours as needed for anxiety. 60 tablet 0  . insulin glargine (LANTUS) 100 UNIT/ML injection Inject 0.06 mLs (6 Units total) into the skin at bedtime. For diabetes management 10 mL 0  . Lancets (FREESTYLE) lancets Use as instructed: For blood sugar checks 100 each 0  . LIPITOR 10 MG tablet Take 10 mg by mouth daily.    . metFORMIN (GLUCOPHAGE) 500 MG tablet Take 1 tablet (500 mg total) by mouth 2 (two) times daily with a meal. For diabetes management 60 tablet 0  . naltrexone (DEPADE) 50 MG tablet Take 1 tablet (50 mg total) by mouth daily. For alcoholism 30 tablet 0  . NORVASC 5 MG tablet Take 5 mg by mouth at bedtime.    . Prenatal Vit-Fe Fumarate-FA (PRENATAL MULTIVITAMIN) TABS tablet Take 1 tablet by mouth daily at 12 noon. (May buy from over the counter): Vitamin supplement    . traZODone (DESYREL) 50 MG tablet Take 1 tablet (50 mg total) by mouth at bedtime as needed for sleep. 30 tablet 0    OB/GYN Status:  No LMP for male patient.  General Assessment Data Location of Assessment: [facility] Assessment Services TTS Assessment: In system Is this a Tele or Face-to-Face Assessment?: Face-to-Face Is this an Initial Assessment or a Re-assessment for this encounter?: Initial Assessment Patient Accompanied by:: N/A      Disposition: Lindon Romp, FNP recommends inpatient criteria. Per [place] pt sent to [facility] for medical clearance. Pt accepted to [facility] pending pt medical clearance/lab work.     On Site Evaluation by:  Lovie Macadamia, MSW Reviewed with Physician:  Aubrey Sink Jandiel Magallanes 05/03/2019 10:15 PM

## 2019-05-03 NOTE — Telephone Encounter (Signed)
CALLED PATIENT TO INFORM OF ADT FOR 05-20-19 - ARRIVAL TIME- 3:15 PM WITH DR. Lovena Neighbours, UNABLE TO LVM VM FULL

## 2019-05-04 ENCOUNTER — Inpatient Hospital Stay (HOSPITAL_COMMUNITY)
Admission: AD | Admit: 2019-05-04 | Discharge: 2019-05-07 | DRG: 881 | Disposition: A | Discharge status: Home / Self Care | Payer: Federal, State, Local not specified - Other | Source: Intra-hospital | Attending: Psychiatry | Admitting: Psychiatry

## 2019-05-04 ENCOUNTER — Encounter (HOSPITAL_COMMUNITY): Payer: Self-pay | Admitting: Psychiatry

## 2019-05-04 DIAGNOSIS — E785 Hyperlipidemia, unspecified: Secondary | ICD-10-CM | POA: Diagnosis present

## 2019-05-04 DIAGNOSIS — E119 Type 2 diabetes mellitus without complications: Secondary | ICD-10-CM | POA: Diagnosis present

## 2019-05-04 DIAGNOSIS — Z634 Disappearance and death of family member: Secondary | ICD-10-CM | POA: Diagnosis not present

## 2019-05-04 DIAGNOSIS — F419 Anxiety disorder, unspecified: Secondary | ICD-10-CM | POA: Diagnosis present

## 2019-05-04 DIAGNOSIS — F1020 Alcohol dependence, uncomplicated: Secondary | ICD-10-CM | POA: Diagnosis present

## 2019-05-04 DIAGNOSIS — C61 Malignant neoplasm of prostate: Secondary | ICD-10-CM | POA: Diagnosis present

## 2019-05-04 DIAGNOSIS — Z818 Family history of other mental and behavioral disorders: Secondary | ICD-10-CM | POA: Diagnosis not present

## 2019-05-04 DIAGNOSIS — F329 Major depressive disorder, single episode, unspecified: Secondary | ICD-10-CM | POA: Diagnosis present

## 2019-05-04 DIAGNOSIS — Z79899 Other long term (current) drug therapy: Secondary | ICD-10-CM | POA: Diagnosis not present

## 2019-05-04 DIAGNOSIS — F1420 Cocaine dependence, uncomplicated: Secondary | ICD-10-CM | POA: Diagnosis present

## 2019-05-04 DIAGNOSIS — G4700 Insomnia, unspecified: Secondary | ICD-10-CM | POA: Diagnosis present

## 2019-05-04 DIAGNOSIS — Z9089 Acquired absence of other organs: Secondary | ICD-10-CM

## 2019-05-04 DIAGNOSIS — G4730 Sleep apnea, unspecified: Secondary | ICD-10-CM | POA: Diagnosis present

## 2019-05-04 DIAGNOSIS — Z8249 Family history of ischemic heart disease and other diseases of the circulatory system: Secondary | ICD-10-CM

## 2019-05-04 DIAGNOSIS — Z794 Long term (current) use of insulin: Secondary | ICD-10-CM

## 2019-05-04 DIAGNOSIS — R45851 Suicidal ideations: Secondary | ICD-10-CM | POA: Diagnosis present

## 2019-05-04 DIAGNOSIS — I10 Essential (primary) hypertension: Secondary | ICD-10-CM | POA: Diagnosis present

## 2019-05-04 DIAGNOSIS — Z8042 Family history of malignant neoplasm of prostate: Secondary | ICD-10-CM

## 2019-05-04 DIAGNOSIS — Z833 Family history of diabetes mellitus: Secondary | ICD-10-CM

## 2019-05-04 DIAGNOSIS — F332 Major depressive disorder, recurrent severe without psychotic features: Secondary | ICD-10-CM | POA: Diagnosis not present

## 2019-05-04 LAB — RESPIRATORY PANEL BY RT PCR (FLU A&B, COVID)
Influenza A by PCR: NEGATIVE
Influenza B by PCR: NEGATIVE
SARS Coronavirus 2 by RT PCR: NEGATIVE

## 2019-05-04 LAB — GLUCOSE, CAPILLARY
Glucose-Capillary: 171 mg/dL — ABNORMAL HIGH (ref 70–99)
Glucose-Capillary: 219 mg/dL — ABNORMAL HIGH (ref 70–99)

## 2019-05-04 LAB — RAPID URINE DRUG SCREEN, HOSP PERFORMED
Amphetamines: NOT DETECTED
Barbiturates: NOT DETECTED
Benzodiazepines: NOT DETECTED
Cocaine: POSITIVE — AB
Opiates: NOT DETECTED
Tetrahydrocannabinol: NOT DETECTED

## 2019-05-04 LAB — HEMOGLOBIN A1C
Hgb A1c MFr Bld: 11.8 % — ABNORMAL HIGH (ref 4.8–5.6)
Hgb A1c MFr Bld: 11.4 % — ABNORMAL HIGH (ref 4.8–5.6)
Mean Plasma Glucose: 291.96 mg/dL
Mean Plasma Glucose: 280.48 mg/dL

## 2019-05-04 MED ORDER — INSULIN ASPART 100 UNIT/ML ~~LOC~~ SOLN
0.0000 [IU] | Freq: Three times a day (TID) | SUBCUTANEOUS | Status: DC
  Filled 2019-05-04: qty 0.09

## 2019-05-04 MED ORDER — GABAPENTIN 300 MG PO CAPS
300.0000 mg | ORAL_CAPSULE | Freq: Two times a day (BID) | ORAL | Status: DC
  Administered 2019-05-04: 300 mg via ORAL
  Filled 2019-05-04: qty 1

## 2019-05-04 MED ORDER — INSULIN ASPART 100 UNIT/ML ~~LOC~~ SOLN
0.0000 [IU] | Freq: Three times a day (TID) | SUBCUTANEOUS | Status: DC
  Administered 2019-05-04: 7 [IU] via SUBCUTANEOUS
  Filled 2019-05-04: qty 0.09

## 2019-05-04 MED ORDER — TRAZODONE HCL 50 MG PO TABS
50.0000 mg | ORAL_TABLET | Freq: Every evening | ORAL | Status: DC | PRN
  Filled 2019-05-04: qty 1

## 2019-05-04 MED ORDER — ASPIRIN 81 MG PO CHEW
81.0000 mg | CHEWABLE_TABLET | Freq: Every day | ORAL | Status: DC
  Administered 2019-05-04: 81 mg via ORAL
  Filled 2019-05-04: qty 1

## 2019-05-04 MED ORDER — MAGNESIUM HYDROXIDE 400 MG/5ML PO SUSP: 30.0000 mL | Freq: Every day | ORAL | Status: DC | PRN

## 2019-05-04 MED ORDER — INSULIN ASPART 100 UNIT/ML ~~LOC~~ SOLN
0.0000 [IU] | Freq: Three times a day (TID) | SUBCUTANEOUS | Status: DC
  Administered 2019-05-04 – 2019-05-05 (×2): 2 [IU] via SUBCUTANEOUS
  Administered 2019-05-05: 18:00:00 5 [IU] via SUBCUTANEOUS
  Administered 2019-05-05 – 2019-05-07 (×6): 3 [IU] via SUBCUTANEOUS

## 2019-05-04 MED ORDER — INSULIN ASPART 100 UNIT/ML ~~LOC~~ SOLN
0.0000 [IU] | Freq: Every day | SUBCUTANEOUS | Status: DC
  Administered 2019-05-04: 2 [IU] via SUBCUTANEOUS
  Administered 2019-05-05: 21:00:00 3 [IU] via SUBCUTANEOUS
  Administered 2019-05-06: 2 [IU] via SUBCUTANEOUS

## 2019-05-04 MED ORDER — METFORMIN HCL 500 MG PO TABS
500.0000 mg | ORAL_TABLET | Freq: Two times a day (BID) | ORAL | Status: DC
  Administered 2019-05-04: 500 mg via ORAL
  Filled 2019-05-04 (×2): qty 1

## 2019-05-04 MED ORDER — AMLODIPINE BESYLATE 5 MG PO TABS
10.0000 mg | ORAL_TABLET | Freq: Every day | ORAL | Status: DC
  Administered 2019-05-04: 10 mg via ORAL
  Filled 2019-05-04: qty 2

## 2019-05-04 MED ORDER — TRAZODONE HCL 50 MG PO TABS: 50.0000 mg | ORAL_TABLET | Freq: Every evening | ORAL | Status: DC | PRN

## 2019-05-04 MED ORDER — AMLODIPINE BESYLATE 10 MG PO TABS
10.0000 mg | ORAL_TABLET | Freq: Every day | ORAL | Status: DC
  Administered 2019-05-05 – 2019-05-07 (×3): 10 mg via ORAL
  Filled 2019-05-04 (×5): qty 1

## 2019-05-04 MED ORDER — INSULIN GLARGINE 100 UNIT/ML ~~LOC~~ SOLN
6.0000 [IU] | Freq: Every day | SUBCUTANEOUS | Status: DC
  Administered 2019-05-04 – 2019-05-05 (×2): 6 [IU] via SUBCUTANEOUS

## 2019-05-04 MED ORDER — ACETAMINOPHEN 325 MG PO TABS: 650.0000 mg | ORAL_TABLET | Freq: Four times a day (QID) | ORAL | Status: DC | PRN

## 2019-05-04 MED ORDER — ATORVASTATIN CALCIUM 10 MG PO TABS
10.0000 mg | ORAL_TABLET | Freq: Every day | ORAL | Status: DC
  Administered 2019-05-04: 10 mg via ORAL
  Filled 2019-05-04 (×2): qty 1

## 2019-05-04 MED ORDER — ALUM & MAG HYDROXIDE-SIMETH 200-200-20 MG/5ML PO SUSP: 30.0000 mL | ORAL | Status: DC | PRN

## 2019-05-04 NOTE — ED Provider Notes (Signed)
[facility] DEPT Provider Note   CSN: [id number] Arrival date & time: 05/03/19  2217     History Chief Complaint  Patient presents with  . Suicidal    Jonathan Austin is a 54 y.o. male.  The history is provided by the patient.  Mental Health Problem Presenting symptoms: suicidal thoughts and suicide attempt   Degree of incapacity (severity):  Moderate Onset quality:  Sudden Timing:  Constant Progression:  Worsening Chronicity:  New Context: drug abuse   Relieved by:  Nothing Worsened by:  Drugs Associated symptoms: no abdominal pain, no chest pain and no headaches    Patient with history of diabetes, depression, subs abuse, prostate cancer presents with suicidal ideation.  He reports he attempted overdose by smoking crack cocaine and drinking alcohol.  He also reports he is still having thoughts of harming himself.  Denies any other overdose.    Past Medical History:  Diagnosis Date  . Cellulitis 06/28/2013  . Cocaine abuse ([place]) 06/28/2013  . Cocaine abuse with cocaine-induced mood disorder ([place])   . Depression   . Diabetes mellitus   . High cholesterol   . Hypertension   . Prostate cancer ([place])   . Scrotal infection 06/28/2013  . Sleep apnea   . Suicidal ideations     Patient Active Problem List   Diagnosis Date Noted  . Malignant neoplasm of prostate ([employer]) 04/30/2019  . MDD (major depressive disorder), recurrent episode, severe ([place]) 01/24/2018  . Polysubstance dependence including opioid drug with daily use ([place]) 03/16/2017  . MDD (major depressive disorder), severe ([place]) 03/15/2017  . Cocaine use disorder, severe, dependence ([place]) 07/19/2016  . Major depressive disorder, recurrent episode ([place]) 07/16/2016  . Alcohol use disorder, severe, dependence ([place]) 07/16/2016  . Thoracic aortic atherosclerosis ([place]) 05/09/2016  . Influenza B 05/09/2016  . Dehydration   . Lactic acidosis   . Severe recurrent major depression without  psychotic features ([place]) 02/21/2016  . Renal insufficiency 01/20/2016  . HTN (hypertension) 09/20/2015  . HLD (hyperlipidemia) 09/20/2015  . Substance abuse ([place]) 08/30/2014  . Substance induced mood disorder ([place]) 05/07/2014  . Cocaine abuse with cocaine-induced mood disorder ([place])   . Alcohol abuse   . Sinus tachycardia 02/12/2014  . Cellulitis 06/28/2013  . Uncontrolled diabetes mellitus with hyperglycemia ([place]) 06/28/2013  . Marijuana abuse 06/28/2013  . Diabetes mellitus type II, uncontrolled ([place]) 12/09/2012    Past Surgical History:  Procedure Laterality Date  . PROSTATE BIOPSY    . TONSILLECTOMY         Family History  Problem Relation Age of Onset  . Hypertension Mother   . Hypertension Father   . Diabetes Father   . Prostate cancer Maternal Grandfather 96  . Breast cancer Neg Hx   . Colon cancer Neg Hx   . Pancreatic cancer Neg Hx     Social History   Tobacco Use  . Smoking status: Never Smoker  . Smokeless tobacco: Never Used  Substance Use Topics  . Alcohol use: Yes    Comment: drinks beer 40 oz x 2 daily  . Drug use: Yes    Types: Cocaine, Marijuana    Comment: smokes 2g crack daily     Home Medications Prior to Admission medications   Medication Sig Start Date End Date Taking? Authorizing Provider  amLODipine (NORVASC) 10 MG tablet Take 1 tablet (10 mg total) by mouth daily. For high blood pressure 01/27/18   Lindell Spar I, NP  aspirin 81 MG chewable tablet  Chew 81 mg by mouth daily. 02/28/19   [provider]  glipiZIDE (GLUCOTROL) 2.5 mg TABS tablet SMARTSIG:1 Tablet(s) By Mouth Daily 12/12/18   [provider]  glucose blood (FREESTYLE TEST STRIPS) test strip Use as instructed: For blood sugar monitoring 01/26/18   Lindell Spar I, NP  glucose monitoring kit (FREESTYLE) monitoring kit 1 each by Does not apply route 4 (four) times daily - after meals and at bedtime. 1 month Diabetic Testing Supplies for QAC - QHS: For accu checks.  01/26/18   Lindell Spar I, NP  HM VITAMIN D3 100 MCG (4000 UT) CAPS Take 1 capsule by mouth daily. 02/28/19   [provider]  hydrocortisone cream 1 % apply to penis TID x 10 days 11/01/18   [provider]  hydrOXYzine (ATARAX/VISTARIL) 25 MG tablet Take 1 tablet (25 mg total) by mouth every 6 (six) hours as needed for anxiety. 01/26/18   Lindell Spar I, NP  insulin glargine (LANTUS) 100 UNIT/ML injection Inject 0.06 mLs (6 Units total) into the skin at bedtime. For diabetes management 01/26/18   Lindell Spar I, NP  Lancets (FREESTYLE) lancets Use as instructed: For blood sugar checks 01/26/18   Nwoko, Herbert Pun I, NP  LIPITOR 10 MG tablet Take 10 mg by mouth daily. 02/28/19   [provider]  metFORMIN (GLUCOPHAGE) 500 MG tablet Take 1 tablet (500 mg total) by mouth 2 (two) times daily with a meal. For diabetes management 01/26/18   Lindell Spar I, NP  naltrexone (DEPADE) 50 MG tablet Take 1 tablet (50 mg total) by mouth daily. For alcoholism 01/27/18   Lindell Spar I, NP  NORVASC 5 MG tablet Take 5 mg by mouth at bedtime. 02/28/19   [provider]  Prenatal Vit-Fe Fumarate-FA (PRENATAL MULTIVITAMIN) TABS tablet Take 1 tablet by mouth daily at 12 noon. (May buy from over the counter): Vitamin supplement 01/26/18   Lindell Spar I, NP  traZODone (DESYREL) 50 MG tablet Take 1 tablet (50 mg total) by mouth at bedtime as needed for sleep. 01/26/18   Encarnacion Slates, NP    Allergies    Patient has no known allergies.  Review of Systems   Review of Systems  Constitutional: Negative for fever.  Respiratory: Negative for shortness of breath.   Cardiovascular: Negative for chest pain and leg swelling.  Gastrointestinal: Negative for abdominal pain.  Musculoskeletal: Positive for myalgias.  Neurological: Negative for headaches.  Psychiatric/Behavioral: Positive for suicidal ideas.  All other systems reviewed and are negative.   Physical Exam Updated Vital Signs BP  (!) 143/91 (BP Location: Left Arm)   Pulse 92   Temp 97.7 F (36.5 C) (Oral)   Resp 16   Ht 1.702 m (5' 7" )   Wt 98.9 kg   SpO2 95%   BMI 34.14 kg/m   Physical Exam CONSTITUTIONAL: Well developed/well nourished HEAD: Normocephalic/atraumatic EYES: EOMI ENMT: Mucous membranes moist NECK: supple no meningeal signs SPINE/BACK:entire spine nontender CV: S1/S2 noted, no murmurs/rubs/gallops noted LUNGS: Lungs are clear to auscultation bilaterally, no apparent distress ABDOMEN: soft, nontender, no rebound or guarding, bowel sounds noted throughout abdomen GU:no cva tenderness NEURO: Pt is awake/alert/appropriate, moves all extremitiesx4.  No facial droop.   EXTREMITIES: pulses normal/equal, full ROM SKIN: warm, color normal PSYCH: no abnormalities of mood noted, alert and oriented to situation  ED Results / Procedures / Treatments   Labs (all labs ordered are listed, but only abnormal results are displayed) Labs Reviewed  COMPREHENSIVE METABOLIC PANEL -  Abnormal; Notable for the following components:      Result Value   Sodium 134 (*)    Glucose, Bld 236 (*)    All other components within normal limits  SALICYLATE LEVEL - Abnormal; Notable for the following components:   Salicylate Lvl <3.6 (*)    All other components within normal limits  ACETAMINOPHEN LEVEL - Abnormal; Notable for the following components:   Acetaminophen (Tylenol), Serum <10 (*)    All other components within normal limits  RESPIRATORY PANEL BY RT PCR (FLU A&B, COVID)  ETHANOL  CBC  RAPID URINE DRUG SCREEN, HOSP PERFORMED    EKG None  Radiology No results found.  Procedures Procedures  Medications Ordered in ED Medications  amLODipine (NORVASC) tablet 10 mg (has no administration in time range)  aspirin chewable tablet 81 mg (has no administration in time range)  atorvastatin (LIPITOR) tablet 10 mg (has no administration in time range)  metFORMIN (GLUCOPHAGE) tablet 500 mg (has no  administration in time range)  traZODone (DESYREL) tablet 50 mg (has no administration in time range)    ED Course  I have reviewed the triage vital signs and the nursing notes.  Pertinent labs  results that were available during my care of the patient were reviewed by me and considered in my medical decision making (see chart for details).    MDM Rules/Calculators/A&P                      12:20 AM Patient medically stable.  He does have mild hyperglycemia, home meds ordered Consult psych  The patient has been placed in psychiatric observation due to the need to provide a safe environment for the patient while obtaining psychiatric consultation and evaluation, as well as ongoing medical and medication management to treat the patient's condition.  The patient has not been placed under full IVC at this time.  Final Clinical Impression(s) / ED Diagnoses Final diagnoses:  Suicidal ideation    Rx / DC Orders ED Discharge Orders    None       Ripley Fraise, MD 05/04/19 0021

## 2019-05-04 NOTE — ED Notes (Signed)
Pharmacy to tube medications

## 2019-05-04 NOTE — Progress Notes (Signed)
   05/04/19 2211  COVID-19 Daily Checkoff  Have you had a fever (temp > 37.80C/100F)  in the past 24 hours?  No  If you have had runny nose, nasal congestion, sneezing in the past 24 hours, has it worsened? No  COVID-19 EXPOSURE  Have you traveled outside the state in the past 14 days? No  Have you been in contact with someone with a confirmed diagnosis of COVID-19 or PUI in the past 14 days without wearing appropriate PPE? No  Have you been living in the same home as a person with confirmed diagnosis of COVID-19 or a PUI (household contact)? No  Have you been diagnosed with COVID-19? No

## 2019-05-04 NOTE — ED Notes (Signed)
Safe transport here for pt transfer to [facility]

## 2019-05-04 NOTE — ED Notes (Signed)
Attempted to call report to [facility]. RN to call back to receive report.

## 2019-05-04 NOTE — Progress Notes (Signed)
Pt accepted to [facility], Room 302-01 after 1400 hours - to service of MD Clary - per T. Hall Busing NP, patient is medically cleared.  Report can be called to [phone] when transportation arrives to transport

## 2019-05-04 NOTE — ED Notes (Signed)
Unable to access patient medications in pyxis. Pharmacy to tube down medications.

## 2019-05-04 NOTE — ED Notes (Signed)
Pt aware urine sample needed. Pt went to restroom but did not get urine.

## 2019-05-04 NOTE — ED Provider Notes (Signed)
Emergency Medicine Observation Re-evaluation Note  Jonathan Austin is a 54 y.o. male, seen on rounds today.  Pt initially presented to the ED for complaints of Suicidal Currently, the patient is awaiting assessment   .  Physical Exam  BP (!) 139/98   Pulse 89   Temp 97.6 F (36.4 C) (Oral)   Resp 16   Ht 1.702 m (5\' 7" )   Wt 98.9 kg   SpO2 93%   BMI 34.14 kg/m  Physical Exam Deferred   ED Course / MDM  EKG:    I have reviewed the labs performed to date as well as medications administered while in observation.   Plan  Current plan is for TTS eval     [name], MD 05/04/19 [id number]

## 2019-05-04 NOTE — Progress Notes (Signed)
   05/04/19 2216  Psych Admission Type (Psych Patients Only)  Admission Status Voluntary  Psychosocial Assessment  Patient Complaints Substance abuse  Eye Contact Fair  Facial Expression Flat  Affect Appropriate to circumstance  Speech Logical/coherent  Interaction Assertive  Motor Activity Other (Comment) (WDL)  Appearance/Hygiene Unremarkable  Behavior Characteristics Appropriate to situation  Mood Anxious  Thought Process  Coherency WDL  Content WDL  Delusions None reported or observed  Perception WDL  Hallucination None reported or observed  Judgment Poor  Confusion None  Danger to Self  Current suicidal ideation? Denies  Danger to Others  Danger to Others None reported or observed

## 2019-05-04 NOTE — Progress Notes (Signed)
Pt admitted voluntary to [facility] with si thougths to cut, use alcohol and crack. Pt has a hx of daily crack and alcohol use. He was clean for a year prior to relapse. P.'st triggers are the death of his son 01-26-2014 and lost his step son 10 months after that. Pt lives with his wife. He has a family hx of two uncles that committed suicide. He was recently diagnosed with stage 3-4 prostate cancer. He has urgency and frequency with urine. Pt has medical hs of diabetes and HTN. The only medication he was taking at home is insulin.

## 2019-05-04 NOTE — ED Notes (Signed)
Pt resting and watching TV, safety sitter at pt bedside.

## 2019-05-04 NOTE — ED Notes (Signed)
Personal items in bag labeled with cell phone placed in cabinet 23-25

## 2019-05-04 NOTE — ED Notes (Signed)
CBG 301 (pt's bracelet not recognized by glucometer)

## 2019-05-04 NOTE — Progress Notes (Signed)
Adult Psychoeducational Group Note  Date:  05/04/2019 Time:  9:22 PM  Group Topic/Focus:  Wrap-Up Group:   The focus of this group is to help patients review their daily goal of treatment and discuss progress on daily workbooks.  Participation Level:  Active  Participation Quality:  Appropriate and Attentive  Affect:  Appropriate  Cognitive:  Oriented  Insight: Appropriate  Engagement in Group:  Engaged  Modes of Intervention:  Activity  Additional Comments:  Pt attended and engaged in wrap up group. Pt goal was to stay focused and set up plans for home when he is discharged. Pt reports that relationship with family and support system has improved. Pt reports no thoughts of hurting anyone or self. Pt reports no issues or concerns that he would like to discuss at this time. Pt rates his day a 4/10.   Jonathan Austin Lucy [place] 05/04/2019, 9:22 PM

## 2019-05-04 NOTE — ED Notes (Signed)
TTS at bedside. 

## 2019-05-05 DIAGNOSIS — F332 Major depressive disorder, recurrent severe without psychotic features: Secondary | ICD-10-CM

## 2019-05-05 LAB — GLUCOSE, CAPILLARY
Glucose-Capillary: 235 mg/dL — ABNORMAL HIGH (ref 70–99)
Glucose-Capillary: 268 mg/dL — ABNORMAL HIGH (ref 70–99)
Glucose-Capillary: 290 mg/dL — ABNORMAL HIGH (ref 70–99)
Glucose-Capillary: 255 mg/dL — ABNORMAL HIGH (ref 70–99)

## 2019-05-05 LAB — TSH: TSH: 4.811 u[IU]/mL — ABNORMAL HIGH (ref 0.350–4.500)

## 2019-05-05 MED ORDER — GLIPIZIDE 2.5 MG HALF TABLET
2.5000 mg | ORAL_TABLET | Freq: Every day | ORAL | Status: DC
  Administered 2019-05-05 – 2019-05-06 (×2): 2.5 mg via ORAL
  Filled 2019-05-05 (×3): qty 1

## 2019-05-05 MED ORDER — LORAZEPAM 1 MG PO TABS: 1.0000 mg | ORAL_TABLET | Freq: Four times a day (QID) | ORAL | Status: DC | PRN

## 2019-05-05 MED ORDER — METFORMIN HCL 500 MG PO TABS
500.0000 mg | ORAL_TABLET | Freq: Two times a day (BID) | ORAL | Status: DC
  Administered 2019-05-05 – 2019-05-06 (×3): 500 mg via ORAL
  Filled 2019-05-05 (×5): qty 1

## 2019-05-05 MED ORDER — TAMSULOSIN HCL 0.4 MG PO CAPS
0.4000 mg | ORAL_CAPSULE | Freq: Every day | ORAL | Status: DC
  Administered 2019-05-05 – 2019-05-06 (×2): 0.4 mg via ORAL
  Filled 2019-05-05 (×4): qty 1

## 2019-05-05 MED ORDER — FOLIC ACID 1 MG PO TABS
1.0000 mg | ORAL_TABLET | Freq: Every day | ORAL | Status: DC
  Administered 2019-05-05 – 2019-05-07 (×3): 1 mg via ORAL
  Filled 2019-05-05 (×5): qty 1

## 2019-05-05 MED ORDER — THIAMINE HCL 100 MG PO TABS
100.0000 mg | ORAL_TABLET | Freq: Every day | ORAL | Status: DC
  Administered 2019-05-05 – 2019-05-07 (×3): 100 mg via ORAL
  Filled 2019-05-05 (×5): qty 1

## 2019-05-05 MED ORDER — ASPIRIN 81 MG PO TBEC
81.0000 mg | DELAYED_RELEASE_TABLET | Freq: Every day | ORAL | Status: DC
  Administered 2019-05-05 – 2019-05-07 (×3): 81 mg via ORAL
  Filled 2019-05-05 (×5): qty 1

## 2019-05-05 MED ORDER — ATORVASTATIN CALCIUM 10 MG PO TABS
10.0000 mg | ORAL_TABLET | Freq: Every day | ORAL | Status: DC
  Administered 2019-05-05 – 2019-05-06 (×2): 10 mg via ORAL
  Filled 2019-05-05 (×4): qty 1

## 2019-05-05 MED ORDER — CHOLECALCIFEROL 10 MCG (400 UNIT) PO TABS
400.0000 [IU] | ORAL_TABLET | Freq: Every day | ORAL | Status: DC
  Administered 2019-05-05 – 2019-05-07 (×3): 400 [IU] via ORAL
  Filled 2019-05-05 (×5): qty 1

## 2019-05-05 MED ORDER — NALTREXONE HCL 50 MG PO TABS
50.0000 mg | ORAL_TABLET | Freq: Every day | ORAL | Status: DC
  Administered 2019-05-05 – 2019-05-07 (×3): 50 mg via ORAL
  Filled 2019-05-05 (×5): qty 1

## 2019-05-05 NOTE — BHH Group Notes (Signed)
[name] LCSW Group Therapy Note  Date/Time:  05/05/2019 10:00a  Type of Therapy and Topic:  Group Therapy:  Healthy and Unhealthy Supports  Participation Level:  Active   Description of Group:  Patients in this group were introduced to the idea of adding a variety of healthy supports to address the various needs in their lives.Patients discussed what additional healthy supports could be helpful in their recovery and wellness after discharge in order to prevent future hospitalizations.   An emphasis was placed on using counselor, doctor, therapy groups, 12-step groups, and problem-specific support groups to expand supports.  They also worked as a group on developing a specific plan for several patients to deal with unhealthy supports through [place], psychoeducation with loved ones, and even termination of relationships.   Therapeutic Goals:   1)  discuss importance of adding supports to stay well once out of the hospital  2)  compare healthy versus unhealthy supports and identify some examples of each  3)  generate ideas and descriptions of healthy supports that can be added  4)  offer mutual support about how to address unhealthy supports  5)  encourage active participation in and adherence to discharge plan    Summary of Patient Progress:  The patient actively engaged in opening check-in, sharing of feeling "gratification". Pt did not prove to specify any current healthy supports in his life, while current unhealthy supports including individuals that also use and encourage him to use stating that one won't be harmful to his recovery or sobriety.  The patient expressed a willingness to add a church member he was able to identify, sharing of having connected with him before and feeling that he would be a great support to help in his recovery journey. Pt proved open and receptive to input and feedback from alternate group members and facilitator.   Therapeutic Modalities:    Motivational Interviewing Brief Solution-Focused Therapy  Blane Ohara, LCSWA 05/05/2019  1:16 PM

## 2019-05-05 NOTE — BHH Suicide Risk Assessment (Signed)
[facility] Admission Suicide Risk Assessment   Nursing information obtained from:  Patient Demographic factors:  Male, Unemployed Current Mental Status:  Suicidal ideation indicated by patient Loss Factors:  Loss of significant relationship Historical Factors:  Family history of suicide, Family history of mental illness or substance abuse Risk Reduction Factors:  Sense of responsibility to family, Living with another person, especially a relative, Positive therapeutic relationship  Total Time spent with patient: 20 minutes Principal Problem: <principal problem not specified> Diagnosis:  Active Problems:   MDD (major depressive disorder)  Subjective Data: Patient is seen and examined. Patient is a 54 year old male with a past psychiatric history significant for alcohol dependence, cocaine dependence and previous unresolved grief issues who presented to the [facility] emergency department on 05/03/19 with suicidal ideation. The patient stated he had had recent increase in stressors. The patient stated that he always has stress around the time of the anniversary of the death of his son. His son died 6 years ago, and today is the anniversary of his death. Additionally he recently found out that he had prostate cancer, and that is causing him a great deal of stress. His last psychiatric hospitalization was at our facility in December 2019. He stated that shortly after he was discharged there was a parole violation, and he was in jail for approximately 30 days. That was approximately February of 2020. After he was discharged he became involved with family services, and remained sober until approximately November/December 2020. He stated he had been doing very well, but slipped up and relapsed. Since then he has been using cocaine and alcohol. He admitted to helplessness, hopelessness or worthlessness. He admitted to suicidal ideation. He was admitted to the hospital for evaluation and  stabilization.  Continued Clinical Symptoms:  Alcohol Use Disorder Identification Test Final Score (AUDIT): 28 The "Alcohol Use Disorders Identification Test", Guidelines for Use in Primary Care, Second Edition.  World Pharmacologist [facility]). Score between 0-7:  no or low risk or alcohol related problems. Score between 8-15:  moderate risk of alcohol related problems. Score between 16-19:  high risk of alcohol related problems. Score 20 or above:  warrants further diagnostic evaluation for alcohol dependence and treatment.   CLINICAL FACTORS:   Depression:   Anhedonia Comorbid alcohol abuse/dependence Hopelessness Impulsivity Insomnia Alcohol/Substance Abuse/Dependencies Medical Diagnoses and Treatments/Surgeries   Musculoskeletal: Strength & Muscle Tone: within normal limits Gait & Station: normal Patient leans: N/A  Psychiatric Specialty Exam: Physical Exam  Nursing note and vitals reviewed. Constitutional: He is oriented to person, place, and time. He appears well-developed and well-nourished.  HENT:  Head: Normocephalic and atraumatic.  Respiratory: Effort normal.  Neurological: He is alert and oriented to person, place, and time.    Review of Systems  Blood pressure 111/84, pulse 99, temperature (!) 96.7 F (35.9 C), resp. rate 18, height 5\' 7"  (1.702 m), weight 97.5 kg, SpO2 99 %.Body mass index is 33.67 kg/m.  General Appearance: Casual  Eye Contact:  Fair  Speech:  Normal Rate  Volume:  Normal  Mood:  Depressed  Affect:  Congruent  Thought Process:  Coherent and Descriptions of Associations: Intact  Orientation:  Full (Time, Place, and Person)  Thought Content:  Logical  Suicidal Thoughts:  Yes.  without intent/plan  Homicidal Thoughts:  No  Memory:  Immediate;   Fair Recent;   Fair Remote;   Fair  Judgement:  Intact  Insight:  Fair  Psychomotor Activity:  Normal  Concentration:  Concentration: Fair  and Attention Span: Fair  Recall:  [employer] of  Knowledge:  Fair  Language:  Good  Akathisia:  Negative  Handed:  Right  AIMS (if indicated):     Assets:  Desire for Improvement Housing Resilience  ADL's:  Intact  Cognition:  WNL  Sleep:         COGNITIVE FEATURES THAT CONTRIBUTE TO RISK:  None    SUICIDE RISK:   Mild:  Suicidal ideation of limited frequency, intensity, duration, and specificity.  There are no identifiable plans, no associated intent, mild dysphoria and related symptoms, good self-control (both objective and subjective assessment), few other risk factors, and identifiable protective factors, including available and accessible social support.  PLAN OF CARE: Patient is seen and examined. Patient is a 54 year old male with the above stated past medical and psychiatric history who was admitted secondary to detoxification as well as suicidal ideation. He will be admitted to the hospital. He will be integrated into the milieu. He will be encouraged to attend groups. He will be placed on lorazepam 1 mg p.o. every 6 hours as needed a CIWA greater than 10. He will also be placed on folic acid as well as thiamine. His blood sugar is significantly elevated this morning, and his blood sugar medicines will be continued as well as a sliding scale. This includes glipizide, Metformin and Lantus insulin. He is on amlodipine for hypertension, a coated aspirin for heart health, and Lipitor for hyperlipidemia. He will be placed back on naltrexone 50 mg p.o. daily which helped him greatly while he remained sober 2020. He will also have available hydroxyzine as well as trazodone for anxiety and sleep respectively. Review of his laboratories revealed a blood sugar of 235. His sodium was slightly low at 134. The rest of his electrolytes including his liver function enzymes were normal. His CBC was normal. Acetaminophen and salicylate were negative. His hemoglobin A1c was 11.4. Drug screen was positive for cocaine. With regard to the patient's recent  diagnosis of prostate cancer I have reviewed his notes, and there is no evidence of metastatic disease. It does look like a high-grade cancer in his prostate. He is to start hormonal therapy as well as radiation therapy to the prostate next week. He has been on Flomax secondary to urinary symptoms, and this will as well be restarted.  I certify that inpatient services furnished can reasonably be expected to improve the patient's condition.   [name], MD 05/05/2019, 9:04 AM

## 2019-05-05 NOTE — Progress Notes (Signed)
   05/05/19 2240  COVID-19 Daily Checkoff  Have you had a fever (temp > 37.80C/100F)  in the past 24 hours?  No  If you have had runny nose, nasal congestion, sneezing in the past 24 hours, has it worsened? No  COVID-19 EXPOSURE  Have you traveled outside the state in the past 14 days? No  Have you been in contact with someone with a confirmed diagnosis of COVID-19 or PUI in the past 14 days without wearing appropriate PPE? No  Have you been living in the same home as a person with confirmed diagnosis of COVID-19 or a PUI (household contact)? No  Have you been diagnosed with COVID-19? No

## 2019-05-05 NOTE — H&P (Signed)
Psychiatric Admission Assessment Adult  Patient Identification: Jonathan Austin MRN:  [id number] Date of Evaluation:  05/05/2019 Chief Complaint:  MDD (major depressive disorder) [F32.9] Principal Diagnosis: <principal problem not specified> Diagnosis:  Active Problems:   MDD (major depressive disorder)  History of Present Illness: Patient is seen and examined. Patient is a 54 year old male with a past psychiatric history significant for alcohol dependence, cocaine dependence and previous unresolved grief issues who presented to the [facility] emergency department on 05/03/19 with suicidal ideation. The patient stated he had had recent increase in stressors. The patient stated that he always has stress around the time of the anniversary of the death of his son. His son died 6 years ago, and today is the anniversary of his death. Additionally he recently found out that he had prostate cancer, and that is causing him a great deal of stress. His last psychiatric hospitalization was at our facility in December 2019. He stated that shortly after he was discharged there was a parole violation, and he was in jail for approximately 30 days. That was approximately February of 2020. After he was discharged he became involved with family services, and remained sober until approximately November/December 2020. He stated he had been doing very well, but slipped up and relapsed. Since then he has been using cocaine and alcohol. He admitted to helplessness, hopelessness or worthlessness. He admitted to suicidal ideation. He was admitted to the hospital for evaluation and stabilization.  Associated Signs/Symptoms: Depression Symptoms:  depressed mood, anhedonia, insomnia, psychomotor agitation, fatigue, feelings of worthlessness/guilt, difficulty concentrating, hopelessness, suicidal thoughts without plan, anxiety, loss of energy/fatigue, disturbed sleep, (Hypo) Manic Symptoms:   Impulsivity, Irritable Mood, Anxiety Symptoms:  Excessive Worry, Psychotic Symptoms:  Denied PTSD Symptoms: Had a traumatic exposure:  Death of son Total Time spent with patient: 30 minutes  Past Psychiatric History: Patient has a longstanding history of alcohol and cocaine dependence.  He has had several psychiatric hospitalizations to our facility.  His last psychiatric hospitalization with [place] was 01/24/2018.  Is the patient at risk to self? Yes.    Has the patient been a risk to self in the past 6 months? No.  Has the patient been a risk to self within the distant past? Yes.    Is the patient a risk to others? No.  Has the patient been a risk to others in the past 6 months? No.  Has the patient been a risk to others within the distant past? No.   Prior Inpatient Therapy:   Prior Outpatient Therapy:    Alcohol Screening: 1. How often do you have a drink containing alcohol?: 4 or more times a week 2. How many drinks containing alcohol do you have on a typical day when you are drinking?: 10 or more 3. How often do you have six or more drinks on one occasion?: Daily or almost daily AUDIT-C Score: 12 4. How often during the last year have you found that you were not able to stop drinking once you had started?: Daily or almost daily 5. How often during the last year have you failed to do what was normally expected from you becasue of drinking?: Daily or almost daily 6. How often during the last year have you needed a first drink in the morning to get yourself going after a heavy drinking session?: Never 7. How often during the last year have you had a feeling of guilt of remorse after drinking?: Monthly 8. How often during the last  year have you been unable to remember what happened the night before because you had been drinking?: Monthly 9. Have you or someone else been injured as a result of your drinking?: No 10. Has a relative or friend or a doctor or another health worker been  concerned about your drinking or suggested you cut down?: Yes, during the last year Alcohol Use Disorder Identification Test Final Score (AUDIT): 28 Alcohol Brief Interventions/Follow-up: Alcohol Education Substance Abuse History in the last 12 months:  Yes.   Consequences of Substance Abuse: Negative Previous Psychotropic Medications: Yes  Psychological Evaluations: Yes  Past Medical History:  Past Medical History:  Diagnosis Date  . Cellulitis 06/28/2013  . Cocaine abuse ([place]) 06/28/2013  . Cocaine abuse with cocaine-induced mood disorder ([place])   . Depression   . Diabetes mellitus   . High cholesterol   . Hypertension   . Prostate cancer ([place])   . Scrotal infection 06/28/2013  . Sleep apnea   . Suicidal ideations     Past Surgical History:  Procedure Laterality Date  . PROSTATE BIOPSY    . TONSILLECTOMY     Family History:  Family History  Problem Relation Age of Onset  . Hypertension Mother   . Hypertension Father   . Diabetes Father   . Prostate cancer Maternal Grandfather 96  . Breast cancer Neg Hx   . Colon cancer Neg Hx   . Pancreatic cancer Neg Hx    Family Psychiatric  History: He has several secondary relatives who have attempted suicide and committed suicide in the past.  He had a great uncle who burned himself while drinking alcohol.  He had poured gasoline over himself. Tobacco Screening: Have you used any form of tobacco in the last 30 days? (Cigarettes, Smokeless Tobacco, Cigars, and/or Pipes): No Social History:  Social History   Substance and Sexual Activity  Alcohol Use Yes   Comment: drinks beer 40 oz x 2 daily     Social History   Substance and Sexual Activity  Drug Use Yes  . Types: Cocaine, Marijuana   Comment: smokes 2g crack daily     Additional Social History: Marital status: Married Number of Years Married: 7 What types of issues is patient dealing with in the relationship?: both dealing with the loss of their respective sons, financial  issues because he cannot work Are you sexually active?: Yes What is your sexual orientation?: heterosexual Has your sexual activity been affected by drugs, alcohol, medication, or emotional stress?: no Does patient have children?: Yes How many children?: 2 How is patient's relationship with their children?: 3 children - son died of heart condition, stepson died by shooting, daughter lives in [place] - good relationship - 8 grandchildren                         Allergies:  No Known Allergies Lab Results:  Results for orders placed or performed during the hospital encounter of 05/04/19 (from the past 55 hour(s))  Glucose, capillary     Status: Abnormal   Collection Time: 05/04/19  5:51 PM  Result Value Ref Range   Glucose-Capillary 171 (H) 70 - 99 mg/dL    Comment: Glucose reference range applies only to samples taken after fasting for at least 8 hours.  Hemoglobin A1c     Status: Abnormal   Collection Time: 05/04/19  6:34 PM  Result Value Ref Range   Hgb A1c MFr Bld 11.4 (H) 4.8 - 5.6 %  Comment: (NOTE) Pre diabetes:          5.7%-6.4% Diabetes:              >6.4% Glycemic control for   <7.0% adults with diabetes    Mean Plasma Glucose 280.48 mg/dL    Comment: Performed at [facility] [address] [place], [place] [address]  Glucose, capillary     Status: Abnormal   Collection Time: 05/04/19  7:46 PM  Result Value Ref Range   Glucose-Capillary 219 (H) 70 - 99 mg/dL    Comment: Glucose reference range applies only to samples taken after fasting for at least 8 hours.  Glucose, capillary     Status: Abnormal   Collection Time: 05/05/19  5:46 AM  Result Value Ref Range   Glucose-Capillary 235 (H) 70 - 99 mg/dL    Comment: Glucose reference range applies only to samples taken after fasting for at least 8 hours.   Comment 1 Notify RN    Comment 2 Document in Chart   TSH     Status: Abnormal   Collection Time: 05/05/19  6:29 AM  Result Value Ref Range   TSH  4.811 (H) 0.350 - 4.500 uIU/mL    Comment: Performed by a 3rd Generation assay with a functional sensitivity of <=0.01 uIU/mL. Performed at [facility], [employer] [address], [place], [place] [address]   Glucose, capillary     Status: Abnormal   Collection Time: 05/05/19 11:28 AM  Result Value Ref Range   Glucose-Capillary 268 (H) 70 - 99 mg/dL    Comment: Glucose reference range applies only to samples taken after fasting for at least 8 hours.    Blood Alcohol level:  Lab Results  Component Value Date   ETH <10 05/03/2019   ETH <10 11/94/1740    Metabolic Disorder Labs:  Lab Results  Component Value Date   HGBA1C 11.4 (H) 05/04/2019   MPG 280.48 05/04/2019   MPG 291.96 05/03/2019   No results found for: PROLACTIN Lab Results  Component Value Date   CHOL 265 (H) 08/31/2014   TRIG 351 (H) 08/31/2014   HDL 49 08/31/2014   CHOLHDL 5.4 08/31/2014   VLDL 70 (H) 08/31/2014   LDLCALC 146 (H) 08/31/2014   LDLCALC 163 (H) 05/07/2014    Current Medications: Current Facility-Administered Medications  Medication Dose Route Frequency Provider Last Rate Last Admin  . acetaminophen (TYLENOL) tablet 650 mg  650 mg Oral Q6H PRN [name], NP      . alum & mag hydroxide-simeth (MAALOX/MYLANTA) 200-200-20 MG/5ML suspension 30 mL  30 mL Oral Q4H PRN [name], NP      . amLODipine (NORVASC) tablet 10 mg  10 mg Oral Daily [name], NP   10 mg at 05/05/19 8144  . aspirin EC tablet 81 mg  81 mg Oral Daily [name], MD   81 mg at 05/05/19 8185  . atorvastatin (LIPITOR) tablet 10 mg  10 mg Oral q1800 [name], MD      . cholecalciferol (VITAMIN D3) tablet 400 Units  400 Units Oral Daily [name], MD   400 Units at 05/05/19 0810  . folic acid (FOLVITE) tablet 1 mg  1 mg Oral Daily [name], MD   1 mg at 05/05/19 6314  . glipiZIDE (GLUCOTROL) tablet 2.5 mg  2.5 mg Oral QAC breakfast [name], MD   2.5 mg at  05/05/19 0813  .  insulin aspart (novoLOG) injection 0-5 Units  0-5 Units Subcutaneous QHS [name], NP   2 Units at 05/04/19 2153  . insulin aspart (novoLOG) injection 0-9 Units  0-9 Units Subcutaneous TID WC [name], NP   3 Units at 05/05/19 [phone]  . insulin glargine (LANTUS) injection 6 Units  6 Units Subcutaneous QHS [name], NP   6 Units at 05/04/19 2154  . LORazepam (ATIVAN) tablet 1 mg  1 mg Oral Q6H PRN [name], MD      . magnesium hydroxide (MILK OF MAGNESIA) suspension 30 mL  30 mL Oral Daily PRN [name], NP      . metFORMIN (GLUCOPHAGE) tablet 500 mg  500 mg Oral BID WC [name], MD   500 mg at 05/05/19 1751  . naltrexone (DEPADE) tablet 50 mg  50 mg Oral Daily [name], MD   50 mg at 05/05/19 0258  . tamsulosin (FLOMAX) capsule 0.4 mg  0.4 mg Oral QPC supper [name], MD      . thiamine tablet 100 mg  100 mg Oral Daily [name], MD   100 mg at 05/05/19 5277  . traZODone (DESYREL) tablet 50 mg  50 mg Oral QHS PRN [name], NP       PTA Medications: Medications Prior to Admission  Medication Sig Dispense Refill Last Dose  . amLODipine (NORVASC) 10 MG tablet Take 1 tablet (10 mg total) by mouth daily. For high blood pressure 30 tablet 0   . glucose blood (FREESTYLE TEST STRIPS) test strip Use as instructed: For blood sugar monitoring 100 each 0   . glucose monitoring kit (FREESTYLE) monitoring kit 1 each by Does not apply route 4 (four) times daily - after meals and at bedtime. 1 month Diabetic Testing Supplies for QAC - QHS: For accu checks. 1 each 1   . hydrOXYzine (ATARAX/VISTARIL) 25 MG tablet Take 1 tablet (25 mg total) by mouth every 6 (six) hours as needed for anxiety. (Patient not taking: Reported on 05/04/2019) 60 tablet 0   . insulin glargine (LANTUS) 100 UNIT/ML injection Inject 0.06 mLs (6 Units total) into the skin at bedtime. For diabetes management 10 mL 0   . Lancets (FREESTYLE) lancets  Use as instructed: For blood sugar checks 100 each 0   . LIPITOR 10 MG tablet Take 10 mg by mouth daily.     . metFORMIN (GLUCOPHAGE) 500 MG tablet Take 1 tablet (500 mg total) by mouth 2 (two) times daily with a meal. For diabetes management 60 tablet 0   . naltrexone (DEPADE) 50 MG tablet Take 1 tablet (50 mg total) by mouth daily. For alcoholism (Patient not taking: Reported on 05/04/2019) 30 tablet 0   . Prenatal Vit-Fe Fumarate-FA (PRENATAL MULTIVITAMIN) TABS tablet Take 1 tablet by mouth daily at 12 noon. (May buy from over the counter): Vitamin supplement (Patient not taking: Reported on 05/04/2019)     . traZODone (DESYREL) 50 MG tablet Take 1 tablet (50 mg total) by mouth at bedtime as needed for sleep. (Patient not taking: Reported on 05/04/2019) 30 tablet 0     Musculoskeletal: Strength & Muscle Tone: within normal limits Gait & Station: normal Patient leans: N/A  Psychiatric Specialty Exam: Physical Exam  Nursing note and vitals reviewed. Constitutional: He is oriented to person, place, and time. He appears well-developed and well-nourished.  HENT:  Head: Normocephalic and atraumatic.  Respiratory: Effort normal.  Neurological: He is  alert and oriented to person, place, and time.    Review of Systems  Blood pressure 111/84, pulse 99, temperature (!) 96.7 F (35.9 C), resp. rate 18, height 5' 7"  (1.702 m), weight 97.5 kg, SpO2 99 %.Body mass index is 33.67 kg/m.  General Appearance: Casual  Eye Contact:  Fair  Speech:  Normal Rate  Volume:  Normal  Mood:  Depressed and Dysphoric  Affect:  Congruent  Thought Process:  Coherent and Descriptions of Associations: Intact  Orientation:  Full (Time, Place, and Person)  Thought Content:  Logical  Suicidal Thoughts:  Yes.  without intent/plan  Homicidal Thoughts:  No  Memory:  Immediate;   Good Recent;   Good Remote;   Good  Judgement:  Intact  Insight:  Fair  Psychomotor Activity:  Increased  Concentration:  Concentration:  Fair and Attention Span: Fair  Recall:  [employer] of Knowledge:  Fair  Language:  Good  Akathisia:  Negative  Handed:  Right  AIMS (if indicated):     Assets:  Desire for Improvement Housing Resilience  ADL's:  Intact  Cognition:  WNL  Sleep:       Treatment Plan Summary: Daily contact with patient to assess and evaluate symptoms and progress in treatment, Medication management and Plan : Patient is seen and examined. Patient is a 54 year old male with the above stated past medical and psychiatric history who was admitted secondary to detoxification as well as suicidal ideation. He will be admitted to the hospital. He will be integrated into the milieu. He will be encouraged to attend groups. He will be placed on lorazepam 1 mg p.o. every 6 hours as needed a CIWA greater than 10. He will also be placed on folic acid as well as thiamine. His blood sugar is significantly elevated this morning, and his blood sugar medicines will be continued as well as a sliding scale. This includes glipizide, Metformin and Lantus insulin. He is on amlodipine for hypertension, a coated aspirin for heart health, and Lipitor for hyperlipidemia. He will be placed back on naltrexone 50 mg p.o. daily which helped him greatly while he remained sober 2020. He will also have available hydroxyzine as well as trazodone for anxiety and sleep respectively. Review of his laboratories revealed a blood sugar of 235. His sodium was slightly low at 134. The rest of his electrolytes including his liver function enzymes were normal. His CBC was normal. Acetaminophen and salicylate were negative. His hemoglobin A1c was 11.4. Drug screen was positive for cocaine. With regard to the patient's recent diagnosis of prostate cancer I have reviewed his notes, and there is no evidence of metastatic disease. It does look like a high-grade cancer in his prostate. He is to start hormonal therapy as well as radiation therapy to the prostate next  week. He has been on Flomax secondary to urinary symptoms, and this will as well be restarted  Observation Level/Precautions:  Detox 15 minute checks  Laboratory:  Chemistry Profile  Psychotherapy:    Medications:    Consultations:    Discharge Concerns:    Estimated LOS:  Other:     Physician Treatment Plan for Primary Diagnosis: <principal problem not specified> Long Term Goal(s): Improvement in symptoms so as ready for discharge  Short Term Goals: Ability to identify changes in lifestyle to reduce recurrence of condition will improve, Ability to verbalize feelings will improve, Ability to disclose and discuss suicidal ideas, Ability to demonstrate self-control will improve, Ability to identify and develop effective  coping behaviors will improve, Ability to maintain clinical measurements within normal limits will improve, Compliance with prescribed medications will improve and Ability to identify triggers associated with substance abuse/mental health issues will improve  Physician Treatment Plan for Secondary Diagnosis: Active Problems:   MDD (major depressive disorder)  Long Term Goal(s): Improvement in symptoms so as ready for discharge  Short Term Goals: Ability to identify changes in lifestyle to reduce recurrence of condition will improve, Ability to verbalize feelings will improve, Ability to disclose and discuss suicidal ideas, Ability to demonstrate self-control will improve, Ability to identify and develop effective coping behaviors will improve, Ability to maintain clinical measurements within normal limits will improve, Compliance with prescribed medications will improve and Ability to identify triggers associated with substance abuse/mental health issues will improve  I certify that inpatient services furnished can reasonably be expected to improve the patient's condition.    [name], MD 3/28/202111:44 AM

## 2019-05-05 NOTE — Progress Notes (Signed)
   05/05/19 1000  Psych Admission Type (Psych Patients Only)  Admission Status Voluntary  Psychosocial Assessment  Patient Complaints Depression;Sadness;Substance abuse  Eye Contact Fair  Facial Expression Flat  Affect Appropriate to circumstance  Speech Logical/coherent  Interaction Assertive  Motor Activity Other (Comment) (WDL)  Appearance/Hygiene Unremarkable  Behavior Characteristics Cooperative  Mood Depressed  Thought Process  Coherency WDL  Content WDL  Delusions None reported or observed  Perception WDL  Hallucination None reported or observed  Judgment Poor  Confusion None  Danger to Self  Current suicidal ideation? Denies  Danger to Others  Danger to Others None reported or observed
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
## 2019-05-05 NOTE — Progress Notes (Signed)
   05/05/19 2243  Psych Admission Type (Psych Patients Only)  Admission Status Voluntary  Psychosocial Assessment  Patient Complaints None  Eye Contact Fair  Facial Expression Flat  Affect Appropriate to circumstance  Speech Logical/coherent  Interaction Assertive  Motor Activity Other (Comment) (WDL)  Appearance/Hygiene Unremarkable  Behavior Characteristics Appropriate to situation  Mood Depressed;Pleasant  Thought Process  Coherency WDL  Content WDL  Delusions None reported or observed  Perception WDL  Hallucination None reported or observed  Judgment Poor  Confusion None  Danger to Self  Current suicidal ideation? Denies  Danger to Others  Danger to Others None reported or observed

## 2019-05-06 DIAGNOSIS — F1020 Alcohol dependence, uncomplicated: Secondary | ICD-10-CM

## 2019-05-06 LAB — GLUCOSE, CAPILLARY
Glucose-Capillary: 239 mg/dL — ABNORMAL HIGH (ref 70–99)
Glucose-Capillary: 301 mg/dL — ABNORMAL HIGH (ref 70–99)
Glucose-Capillary: 273 mg/dL — ABNORMAL HIGH (ref 70–99)
Glucose-Capillary: 244 mg/dL — ABNORMAL HIGH (ref 70–99)
Glucose-Capillary: 228 mg/dL — ABNORMAL HIGH (ref 70–99)
Glucose-Capillary: 242 mg/dL — ABNORMAL HIGH (ref 70–99)

## 2019-05-06 MED ORDER — METFORMIN HCL 850 MG PO TABS
850.0000 mg | ORAL_TABLET | Freq: Two times a day (BID) | ORAL | Status: DC
  Administered 2019-05-06 – 2019-05-07 (×2): 850 mg via ORAL
  Filled 2019-05-06 (×6): qty 1

## 2019-05-06 MED ORDER — INSULIN GLARGINE 100 UNIT/ML ~~LOC~~ SOLN
10.0000 [IU] | Freq: Every day | SUBCUTANEOUS | Status: DC
  Administered 2019-05-06: 10 [IU] via SUBCUTANEOUS
  Filled 2019-05-06: qty 0.1

## 2019-05-06 MED ORDER — GLIPIZIDE 5 MG PO TABS
5.0000 mg | ORAL_TABLET | Freq: Every day | ORAL | Status: DC
  Administered 2019-05-07: 07:00:00 5 mg via ORAL
  Filled 2019-05-06 (×3): qty 1

## 2019-05-06 NOTE — Progress Notes (Signed)
[facility] MD Progress Note  05/06/2019 1:24 PM [facility]  MRN:  [id number] Subjective:  "I'm ok."  [facility] found sitting in group therapy. He presents with euthymic affect today and reports mood is "much better." He reports detoxing from substances has helped clear his mind. He denies withdrawal symptoms. He has spoken with both his wife and his outpatient therapist, who are reliable supports. He is future-oriented, with plans to return to [facility], outpatient group therapy, and his individual therapist at [facility]. He is eager to start treatment for prostate cancer as well. He reports "letting his guard down" regarding alcohol and cocaine addiction but states he knows the steps he needs to take to return to sobriety. He reports good social support from both his family and church. He denies SI/HI/AVH. He was started on naltrexone, which he states was helpful with sobriety in the past. He denies medication side effects. Morning CBG 273.  From admission H&P: Patient is a 54 year old male with a past psychiatric history significant for alcohol dependence, cocaine dependence and previous unresolved grief issues who presented to the [facility] emergency department on 05/03/19 with suicidal ideation. The patient stated he had had recent increase in stressors. The patient stated that he always has stress around the time of the anniversary of the death of his son. Additionally he recently found out that he had prostate cancer, and that is causing him a great deal of stress.   Principal Problem: <principal problem not specified> Diagnosis: Active Problems:   MDD (major depressive disorder)  Total Time spent with patient: 15 minutes  Past Psychiatric History: See admission H&P  Past Medical History:  Past Medical History:  Diagnosis Date  . Cellulitis 06/28/2013  . Cocaine abuse ([place]) 06/28/2013  . Cocaine abuse with cocaine-induced mood disorder ([place])   . Depression   . Diabetes  mellitus   . High cholesterol   . Hypertension   . Prostate cancer ([place])   . Scrotal infection 06/28/2013  . Sleep apnea   . Suicidal ideations     Past Surgical History:  Procedure Laterality Date  . PROSTATE BIOPSY    . TONSILLECTOMY     Family History:  Family History  Problem Relation Age of Onset  . Hypertension Mother   . Hypertension Father   . Diabetes Father   . Prostate cancer Maternal Grandfather 96  . Breast cancer Neg Hx   . Colon cancer Neg Hx   . Pancreatic cancer Neg Hx    Family Psychiatric  History: See admission H&P Social History:  Social History   Substance and Sexual Activity  Alcohol Use Yes   Comment: drinks beer 40 oz x 2 daily     Social History   Substance and Sexual Activity  Drug Use Yes  . Types: Cocaine, Marijuana   Comment: smokes 2g crack daily     Social History   Socioeconomic History  . Marital status: Married    Spouse name: [facility]  . Number of children: 1  . Years of education: Not on file  . Highest education level: Not on file  Occupational History  . Not on file  Tobacco Use  . Smoking status: Never Smoker  . Smokeless tobacco: Never Used  Substance and Sexual Activity  . Alcohol use: Yes    Comment: drinks beer 40 oz x 2 daily  . Drug use: Yes    Types: Cocaine, Marijuana    Comment: smokes 2g crack daily   .  Sexual activity: Yes  Other Topics Concern  . Not on file  Social History Narrative   Only child is deceased   Social Determinants of Radio broadcast assistant Strain:   . Difficulty of Paying Living Expenses:   Food Insecurity:   . Worried About Charity fundraiser in the Last Year:   . Arboriculturist in the Last Year:   Transportation Needs:   . Film/video editor (Medical):   Marland Kitchen Lack of Transportation (Non-Medical):   Physical Activity:   . Days of Exercise per Week:   . Minutes of Exercise per Session:   Stress:   . Feeling of Stress :   Social Connections:   . Frequency of  Communication with Friends and Family:   . Frequency of Social Gatherings with Friends and Family:   . Attends Religious Services:   . Active Member of Clubs or Organizations:   . Attends Archivist Meetings:   Marland Kitchen Marital Status:    Additional Social History:                         Sleep: Good  Appetite:  Good  Current Medications: Current Facility-Administered Medications  Medication Dose Route Frequency Provider Last Rate Last Admin  . acetaminophen (TYLENOL) tablet 650 mg  650 mg Oral Q6H PRN [name], NP      . alum & mag hydroxide-simeth (MAALOX/MYLANTA) 200-200-20 MG/5ML suspension 30 mL  30 mL Oral Q4H PRN [name], NP      . amLODipine (NORVASC) tablet 10 mg  10 mg Oral Daily [name], NP   10 mg at 05/06/19 0845  . aspirin EC tablet 81 mg  81 mg Oral Daily [name], MD   81 mg at 05/06/19 0846  . atorvastatin (LIPITOR) tablet 10 mg  10 mg Oral q1800 [name], MD   10 mg at 05/05/19 1722  . cholecalciferol (VITAMIN D3) tablet 400 Units  400 Units Oral Daily [name], MD   400 Units at 05/06/19 [phone]  . folic acid (FOLVITE) tablet 1 mg  1 mg Oral Daily [name], MD   1 mg at 05/06/19 0846  . glipiZIDE (GLUCOTROL) tablet 2.5 mg  2.5 mg Oral QAC breakfast [name], MD   2.5 mg at 05/06/19 0603  . insulin aspart (novoLOG) injection 0-5 Units  0-5 Units Subcutaneous QHS [name], NP   3 Units at 05/05/19 2105  . insulin aspart (novoLOG) injection 0-9 Units  0-9 Units Subcutaneous TID WC [name], NP   3 Units at 05/06/19 1212  . insulin glargine (LANTUS) injection 6 Units  6 Units Subcutaneous QHS [name], NP   6 Units at 05/05/19 2104  . LORazepam (ATIVAN) tablet 1 mg  1 mg Oral Q6H PRN [name], MD      . magnesium hydroxide (MILK OF MAGNESIA) suspension 30 mL  30 mL Oral Daily PRN [name], NP      . metFORMIN (GLUCOPHAGE) tablet 500 mg  500 mg Oral BID  WC [name], MD   500 mg at 05/06/19 0845  . naltrexone (DEPADE) tablet 50 mg  50 mg Oral Daily [name], MD   50 mg at 05/06/19 0845  . tamsulosin (FLOMAX) capsule 0.4 mg  0.4 mg Oral QPC supper [name], MD   0.4 mg at 05/05/19  1720  . thiamine tablet 100 mg  100 mg Oral Daily [name], MD   100 mg at 05/06/19 0845  . traZODone (DESYREL) tablet 50 mg  50 mg Oral QHS PRN [name], NP        Lab Results:  Results for orders placed or performed during the [facility] encounter of 05/04/19 (from the past 48 hour(s))  Glucose, capillary     Status: Abnormal   Collection Time: 05/04/19  5:51 PM  Result Value Ref Range   Glucose-Capillary 171 (H) 70 - 99 mg/dL    Comment: Glucose reference range applies only to samples taken after fasting for at least 8 hours.  Hemoglobin A1c     Status: Abnormal   Collection Time: 05/04/19  6:34 PM  Result Value Ref Range   Hgb A1c MFr Bld 11.4 (H) 4.8 - 5.6 %    Comment: (NOTE) Pre diabetes:          5.7%-6.4% Diabetes:              >6.4% Glycemic control for   <7.0% adults with diabetes    Mean Plasma Glucose 280.48 mg/dL    Comment: Performed at [facility] [address] [place], [place] [address]  Glucose, capillary     Status: Abnormal   Collection Time: 05/04/19  7:46 PM  Result Value Ref Range   Glucose-Capillary 219 (H) 70 - 99 mg/dL    Comment: Glucose reference range applies only to samples taken after fasting for at least 8 hours.  Glucose, capillary     Status: Abnormal   Collection Time: 05/05/19  5:46 AM  Result Value Ref Range   Glucose-Capillary 235 (H) 70 - 99 mg/dL    Comment: Glucose reference range applies only to samples taken after fasting for at least 8 hours.   Comment 1 Notify RN    Comment 2 Document in Chart   TSH     Status: Abnormal   Collection Time: 05/05/19  6:29 AM  Result Value Ref Range   TSH 4.811 (H) 0.350 - 4.500 uIU/mL    Comment: Performed by a 3rd  Generation assay with a functional sensitivity of <=0.01 uIU/mL. Performed at [facility], [place] [address] [place], [place] [address]   Glucose, capillary     Status: Abnormal   Collection Time: 05/05/19 11:28 AM  Result Value Ref Range   Glucose-Capillary 268 (H) 70 - 99 mg/dL    Comment: Glucose reference range applies only to samples taken after fasting for at least 8 hours.  Glucose, capillary     Status: Abnormal   Collection Time: 05/05/19  4:54 PM  Result Value Ref Range   Glucose-Capillary 290 (H) 70 - 99 mg/dL    Comment: Glucose reference range applies only to samples taken after fasting for at least 8 hours.  Glucose, capillary     Status: Abnormal   Collection Time: 05/05/19  8:11 PM  Result Value Ref Range   Glucose-Capillary 255 (H) 70 - 99 mg/dL    Comment: Glucose reference range applies only to samples taken after fasting for at least 8 hours.   Comment 1 Notify RN    Comment 2 Document in Chart   Glucose, capillary     Status: Abnormal   Collection Time: 05/06/19  5:54 AM  Result Value Ref Range   Glucose-Capillary 273 (H) 70 - 99 mg/dL    Comment: Glucose reference range applies only to samples  taken after fasting for at least 8 hours.   Comment 1 Notify RN    Comment 2 Document in Chart   Glucose, capillary     Status: Abnormal   Collection Time: 05/06/19 11:54 AM  Result Value Ref Range   Glucose-Capillary 244 (H) 70 - 99 mg/dL    Comment: Glucose reference range applies only to samples taken after fasting for at least 8 hours.    Blood Alcohol level:  Lab Results  Component Value Date   ETH <10 05/03/2019   ETH <10 [id number]    Metabolic Disorder Labs: Lab Results  Component Value Date   HGBA1C 11.4 (H) 05/04/2019   MPG 280.48 05/04/2019   MPG 291.96 05/03/2019   No results found for: PROLACTIN Lab Results  Component Value Date   CHOL 265 (H) 08/31/2014   TRIG 351 (H) 08/31/2014   HDL 49 08/31/2014   CHOLHDL 5.4  08/31/2014   VLDL 70 (H) 08/31/2014   LDLCALC 146 (H) 08/31/2014   LDLCALC 163 (H) 05/07/2014    Physical Findings: AIMS: Facial and Oral Movements Muscles of Facial Expression: None, normal Lips and Perioral Area: None, normal Jaw: None, normal Tongue: None, normal,Extremity Movements Upper (arms, wrists, hands, fingers): None, normal Lower (legs, knees, ankles, toes): None, normal, Trunk Movements Neck, shoulders, hips: None, normal, Overall Severity Severity of abnormal movements (highest score from questions above): None, normal Incapacitation due to abnormal movements: None, normal Patient's awareness of abnormal movements (rate only patient's report): No Awareness, Dental Status Current problems with teeth and/or dentures?: No Does patient usually wear dentures?: No  CIWA:  CIWA-Ar Total: 1 COWS:     Musculoskeletal: Strength & Muscle Tone: within normal limits Gait & Station: normal Patient leans: N/A  Psychiatric Specialty Exam: Physical Exam  Nursing note and vitals reviewed. Constitutional: He is oriented to person, place, and time. He appears well-developed and well-nourished.  Cardiovascular: Normal rate.  Respiratory: Effort normal.  Neurological: He is alert and oriented to person, place, and time.    Review of Systems  Constitutional: Negative.   Respiratory: Negative for cough and shortness of breath.   Gastrointestinal: Negative for diarrhea, nausea and vomiting.  Neurological: Negative for tremors and headaches.  Psychiatric/Behavioral: Negative for agitation, behavioral problems, dysphoric mood, hallucinations, self-injury, sleep disturbance and suicidal ideas. The patient is not nervous/anxious and is not hyperactive.     Blood pressure (!) 126/91, pulse (!) 103, temperature (!) 97.4 F (36.3 C), resp. rate 18, height 5\' 7"  (1.702 m), weight 97.5 kg, SpO2 99 %.Body mass index is 33.67 kg/m.  General Appearance: Casual  Eye Contact:  Good  Speech:   Normal Rate  Volume:  Normal  Mood:  Euthymic  Affect:  Appropriate and Congruent  Thought Process:  Coherent and Goal Directed  Orientation:  Full (Time, Place, and Person)  Thought Content:  Logical  Suicidal Thoughts:  No  Homicidal Thoughts:  No  Memory:  Immediate;   Good Recent;   Good Remote;   Good  Judgement:  Intact  Insight:  Good  Psychomotor Activity:  Normal  Concentration:  Concentration: Good and Attention Span: Good  Recall:  Good  Fund of Knowledge:  Good  Language:  Good  Akathisia:  No  Handed:  Right  AIMS (if indicated):     Assets:  Communication Skills Desire for Improvement Housing Resilience Social Support  ADL's:  Intact  Cognition:  WNL  Sleep:  Number of Hours: 6.75     Treatment Plan  Summary: Daily contact with patient to assess and evaluate symptoms and progress in treatment and Medication management   Continue inpatient hospitalization.  Increase Lantus to 10 units SQ QHS for diabetes Increase glipizide to 5 mg PO daily for diabetes Increase metformin to 850 mg PO BID for diabetes Continue Norvasc 10 mg PO daily for HTN Continue ASA 81 mg PO daily for antiplatelet Continue Lipitor 10 mg PO daily for HLD Continue vitamin D3 400 IU for supplementation Continue folic acid 1 mg PO daily for supplementation Continue sliding scale insulin ACHS for diabetes Continue Ativan 1 mg PO Q6HR PRN CIWA>10 for ETOH withdrawal Continue naltrexone 50 mg PO daily for alcohol use disorer Continue Flomax 0.4 mg PO daily for BPH Continue thiamine 100 mg PO daily for supplementation Continue trazodone 50 mg PO QHS PRN insomnia  Patient will participate in the therapeutic group milieu.  Discharge disposition in progress.   Connye Burkitt, NP 05/06/2019, 1:24 PM

## 2019-05-06 NOTE — Plan of Care (Signed)
Patient stayed in the milieu until bedtime. Pleasant and cooperative. Participated in evening activities. Denied thoughts of self harm. CBG 242. Patient did not need any sleeping medications. Requested to his C-PAP. Currently in bed resting. Safety monitored as expected.

## 2019-05-06 NOTE — Tx Team (Signed)
Interdisciplinary Treatment and Diagnostic Plan Update  05/06/2019 Time of Session: 9:20am Jonathan Austin MRN: [id number]  Principal Diagnosis: <principal problem not specified>  Secondary Diagnoses: Active Problems:   MDD (major depressive disorder)   Current Medications:  Current Facility-Administered Medications  Medication Dose Route Frequency Provider Last Rate Last Admin  . acetaminophen (TYLENOL) tablet 650 mg  650 mg Oral Q6H PRN [name], NP      . alum & mag hydroxide-simeth (MAALOX/MYLANTA) 200-200-20 MG/5ML suspension 30 mL  30 mL Oral Q4H PRN [name], NP      . amLODipine (NORVASC) tablet 10 mg  10 mg Oral Daily [name], NP   10 mg at 05/06/19 0845  . aspirin EC tablet 81 mg  81 mg Oral Daily [name], MD   81 mg at 05/06/19 0846  . atorvastatin (LIPITOR) tablet 10 mg  10 mg Oral q1800 [name], MD   10 mg at 05/05/19 1722  . cholecalciferol (VITAMIN D3) tablet 400 Units  400 Units Oral Daily [name], MD   400 Units at 05/06/19 [phone]  . folic acid (FOLVITE) tablet 1 mg  1 mg Oral Daily [name], MD   1 mg at 05/06/19 0846  . glipiZIDE (GLUCOTROL) tablet 2.5 mg  2.5 mg Oral QAC breakfast [name], MD   2.5 mg at 05/06/19 0603  . insulin aspart (novoLOG) injection 0-5 Units  0-5 Units Subcutaneous QHS [name], NP   3 Units at 05/05/19 2105  . insulin aspart (novoLOG) injection 0-9 Units  0-9 Units Subcutaneous TID WC [name], NP   3 Units at 05/06/19 0604  . insulin glargine (LANTUS) injection 6 Units  6 Units Subcutaneous QHS [name], NP   6 Units at 05/05/19 2104  . LORazepam (ATIVAN) tablet 1 mg  1 mg Oral Q6H PRN [name], MD      . magnesium hydroxide (MILK OF MAGNESIA) suspension 30 mL  30 mL Oral Daily PRN [name], NP      . metFORMIN (GLUCOPHAGE) tablet 500 mg  500 mg Oral BID WC [name], MD   500 mg at 05/06/19 0845  . naltrexone (DEPADE) tablet  50 mg  50 mg Oral Daily [name], MD   50 mg at 05/06/19 0845  . tamsulosin (FLOMAX) capsule 0.4 mg  0.4 mg Oral QPC supper [name], MD   0.4 mg at 05/05/19 1720  . thiamine tablet 100 mg  100 mg Oral Daily [name], MD   100 mg at 05/06/19 0845  . traZODone (DESYREL) tablet 50 mg  50 mg Oral QHS PRN [name], NP       PTA Medications: Medications Prior to Admission  Medication Sig Dispense Refill Last Dose  . amLODipine (NORVASC) 10 MG tablet Take 1 tablet (10 mg total) by mouth daily. For high blood pressure 30 tablet 0   . glucose blood (FREESTYLE TEST STRIPS) test strip Use as instructed: For blood sugar monitoring 100 each 0   . glucose monitoring kit (FREESTYLE) monitoring kit 1 each by Does not apply route 4 (four) times daily - after meals and at bedtime. 1 month Diabetic Testing Supplies for QAC - QHS: For accu checks. 1 each 1   . hydrOXYzine (ATARAX/VISTARIL) 25 MG tablet Take 1 tablet (25 mg total) by mouth every 6 (six) hours as needed for anxiety. (Patient not taking: Reported  on 05/04/2019) 60 tablet 0   . insulin glargine (LANTUS) 100 UNIT/ML injection Inject 0.06 mLs (6 Units total) into the skin at bedtime. For diabetes management 10 mL 0   . Lancets (FREESTYLE) lancets Use as instructed: For blood sugar checks 100 each 0   . LIPITOR 10 MG tablet Take 10 mg by mouth daily.     . metFORMIN (GLUCOPHAGE) 500 MG tablet Take 1 tablet (500 mg total) by mouth 2 (two) times daily with a meal. For diabetes management 60 tablet 0   . naltrexone (DEPADE) 50 MG tablet Take 1 tablet (50 mg total) by mouth daily. For alcoholism (Patient not taking: Reported on 05/04/2019) 30 tablet 0   . Prenatal Vit-Fe Fumarate-FA (PRENATAL MULTIVITAMIN) TABS tablet Take 1 tablet by mouth daily at 12 noon. (May buy from over the counter): Vitamin supplement (Patient not taking: Reported on 05/04/2019)     . traZODone (DESYREL) 50 MG tablet Take 1 tablet (50 mg total) by mouth  at bedtime as needed for sleep. (Patient not taking: Reported on 05/04/2019) 30 tablet 0     Patient Stressors:    Patient Strengths:    Treatment Modalities: Medication Management, Group therapy, Case management,  1 to 1 session with clinician, Psychoeducation, Recreational therapy.   Physician Treatment Plan for Primary Diagnosis: <principal problem not specified> Long Term Goal(s): Improvement in symptoms so as ready for discharge Improvement in symptoms so as ready for discharge   Short Term Goals: Ability to identify changes in lifestyle to reduce recurrence of condition will improve Ability to verbalize feelings will improve Ability to disclose and discuss suicidal ideas Ability to demonstrate self-control will improve Ability to identify and develop effective coping behaviors will improve Ability to maintain clinical measurements within normal limits will improve Compliance with prescribed medications will improve Ability to identify triggers associated with substance abuse/mental health issues will improve Ability to identify changes in lifestyle to reduce recurrence of condition will improve Ability to verbalize feelings will improve Ability to disclose and discuss suicidal ideas Ability to demonstrate self-control will improve Ability to identify and develop effective coping behaviors will improve Ability to maintain clinical measurements within normal limits will improve Compliance with prescribed medications will improve Ability to identify triggers associated with substance abuse/mental health issues will improve  Medication Management: Evaluate patient's response, side effects, and tolerance of medication regimen.  Therapeutic Interventions: 1 to 1 sessions, Unit Group sessions and Medication administration.  Evaluation of Outcomes: Progressing  Physician Treatment Plan for Secondary Diagnosis: Active Problems:   MDD (major depressive disorder)  Long Term  Goal(s): Improvement in symptoms so as ready for discharge Improvement in symptoms so as ready for discharge   Short Term Goals: Ability to identify changes in lifestyle to reduce recurrence of condition will improve Ability to verbalize feelings will improve Ability to disclose and discuss suicidal ideas Ability to demonstrate self-control will improve Ability to identify and develop effective coping behaviors will improve Ability to maintain clinical measurements within normal limits will improve Compliance with prescribed medications will improve Ability to identify triggers associated with substance abuse/mental health issues will improve Ability to identify changes in lifestyle to reduce recurrence of condition will improve Ability to verbalize feelings will improve Ability to disclose and discuss suicidal ideas Ability to demonstrate self-control will improve Ability to identify and develop effective coping behaviors will improve Ability to maintain clinical measurements within normal limits will improve Compliance with prescribed medications will improve Ability to identify triggers associated with  substance abuse/mental health issues will improve     Medication Management: Evaluate patient's response, side effects, and tolerance of medication regimen.  Therapeutic Interventions: 1 to 1 sessions, Unit Group sessions and Medication administration.  Evaluation of Outcomes: Progressing   RN Treatment Plan for Primary Diagnosis: <principal problem not specified> Long Term Goal(s): Knowledge of disease and therapeutic regimen to maintain health will improve  Short Term Goals: Ability to participate in decision making will improve, Ability to verbalize feelings will improve, Ability to disclose and discuss suicidal ideas, Ability to identify and develop effective coping behaviors will improve and Compliance with prescribed medications will improve  Medication Management: RN will  administer medications as ordered by provider, will assess and evaluate patient's response and provide education to patient for prescribed medication. RN will report any adverse and/or side effects to prescribing provider.  Therapeutic Interventions: 1 on 1 counseling sessions, Psychoeducation, Medication administration, Evaluate responses to treatment, Monitor vital signs and CBGs as ordered, Perform/monitor CIWA, COWS, AIMS and Fall Risk screenings as ordered, Perform wound care treatments as ordered.  Evaluation of Outcomes: Progressing   LCSW Treatment Plan for Primary Diagnosis: <principal problem not specified> Long Term Goal(s): Safe transition to appropriate next level of care at discharge, Engage patient in therapeutic group addressing interpersonal concerns.  Short Term Goals: Engage patient in aftercare planning with referrals and resources  Therapeutic Interventions: Assess for all discharge needs, 1 to 1 time with Social worker, Explore available resources and support systems, Assess for adequacy in community support network, Educate family and significant other(s) on suicide prevention, Complete Psychosocial Assessment, Interpersonal group therapy.  Evaluation of Outcomes: Progressing   Progress in Treatment: Attending groups: Yes. Participating in groups: Yes. Taking medication as prescribed: Yes. Toleration medication: Yes. Family/Significant other contact made: No, will contact:  the patient's wife Patient understands diagnosis: Yes. Discussing patient identified problems/goals with staff: Yes. Medical problems stabilized or resolved: Yes. Denies suicidal/homicidal ideation: Yes. Issues/concerns per patient self-inventory: No. Other:   New problem(s) identified: None   New Short Term/Long Term Goal(s): Detox, medication stabilization, elimination of SI thoughts, development of comprehensive mental wellness plan.    Patient Goals:  "I want to get myself restored"    Discharge Plan or Barriers: Patient plans to return home at discharge. Patient recently admitted. CSW will continue to follow and assess for appropriate referrals and possible discharge planning.    Reason for Continuation of Hospitalization: Anxiety Depression Medication stabilization Suicidal ideation  Estimated Length of Stay: 3-5 days   Attendees: Patient: Jonathan Austin  05/06/2019 10:36 AM  Physician: Dr. Myles Lipps, MD 05/06/2019 10:36 AM  Nursing:  05/06/2019 10:36 AM  RN Care Manager: 05/06/2019 10:36 AM  Social Worker: Radonna Ricker, LCSW 05/06/2019 10:36 AM  Recreational Therapist:  05/06/2019 10:36 AM  Other:  05/06/2019 10:36 AM  Other:  05/06/2019 10:36 AM  Other: 05/06/2019 10:36 AM    Scribe for Treatment Team: Marylee Floras, [place] 05/06/2019 10:36 AM

## 2019-05-06 NOTE — Progress Notes (Signed)
Inpatient Diabetes Program Recommendations  AACE/ADA: New Consensus Statement on Inpatient Glycemic Control (2015)  Target Ranges:  Prepandial:   less than 140 mg/dL      Peak postprandial:   less than 180 mg/dL (1-2 hours)      Critically ill patients:  140 - 180 mg/dL   Lab Results  Component Value Date   GLUCAP 273 (H) 05/06/2019   HGBA1C 11.4 (H) 05/04/2019    Review of Glycemic Control  Diabetes history: DM2 Outpatient Diabetes medications: Lantus 6 units QHS, metformin 500 mg bid Current orders for Inpatient glycemic control: Lantus 6 units QHS, metformin 500 mg bid, glipizide 2.5 mg QAM, Novolog 0-9 units tidwc and hs  Inpatient Diabetes Program Recommendations:    Increase Lantus to 15 units QD  Will need to f/u with PCP regarding HgbA1C of 11.4%.   Will follow.   Thank you. Lorenda Peck, RD, LDN, CDE Inpatient Diabetes Coordinator [phone]

## 2019-05-06 NOTE — Progress Notes (Signed)
Adult Psychoeducational Group Note  Date:  05/06/2019 Time:  9:27 PM  Group Topic/Focus:  Wrap-Up Group:   The focus of this group is to help patients review their daily goal of treatment and discuss progress on daily workbooks.  Participation Level:  Active  Participation Quality:  Appropriate  Affect:  Appropriate  Cognitive:  Appropriate  Insight: Appropriate  Engagement in Group:  Engaged  Modes of Intervention:  Discussion  Additional Comments:  Zoom AA meeting wasn't available on line at 8:00pm, therefore the writer of this note had a regular wrap-up group. Patient attended wrap-up group and said that his day was a 10. Patient was excited about the Music theory and said he enjoyed it.     Jacie Tristan W Sydnie Sigmund [id number], 9:27 PM

## 2019-05-06 NOTE — Progress Notes (Signed)
D"  Patient's self inventory sheet, patient sleeps good, no sleep medication.  Good appetite, normal energy level, good concentration.  Depression 3, denied hopeless and anxiety.  Denied withdrawals.  Denied SI.  Denied physical problems.  Denied physical pain.  Goal is discharge and going to [facility] for follow up.  Put action and effort in plans and goals.  "Thanks for al of your support." Does have discharge plans. A:  Medications administered per MD orders.  Emotional support and encouragement given patient. R:  Safety maintained with 15 minute checks.  Denied SI and HI, contracts for safety.  Denied A/V hallucinations.

## 2019-05-06 NOTE — BHH Suicide Risk Assessment (Signed)
[facility] INPATIENT:  Family/Significant Other Suicide Prevention Education  Suicide Prevention Education:  Education Completed; with wife, Houston Croney [phone] has been identified by the patient as the family member/significant other with whom the patient will be residing, and identified as the person(s) who will aid the patient in the event of a mental health crisis (suicidal ideations/suicide attempt).  With written consent from the patient, the family member/significant other has been provided the following suicide prevention education, prior to the and/or following the discharge of the patient.  The suicide prevention education provided includes the following:  Suicide risk factors  Suicide prevention and interventions  National Suicide Hotline telephone number  [facility] assessment telephone number  [facility] Emergency Assistance [place] and/or Residential Mobile Crisis Unit telephone number  Request made of family/significant other to:  Remove weapons (e.g., guns, rifles, knives), all items previously/currently identified as safety concern.    Remove drugs/medications (over-the-counter, prescriptions, illicit drugs), all items previously/currently identified as a safety concern.  The family member/significant other verbalizes understanding of the suicide prevention education information provided.  The family member/significant other agrees to remove the items of safety concern listed above.  CSW spoke with the patient's wife.  Wife reports that pt is dealing with "depression from the cancer". She reports that the patient was recently diagnosed with Stage 3 prostate cancer, borderline stage 4". She reports that pt's insurance has not kicked in and they are concerned how they can afford his treatment.  She reports that pt was further triggered by the anniversary of the passing of his son and murder of his step-son.  She reports that the patient fell  off the wagon, however, she wants him home and thinks that continued work with pt's outpatient therapist will be helpful.  She reports that patient is not a danger to self or others and patient does not have access to weapons.    Rozann Lesches 05/06/2019, 3:01 PM

## 2019-05-06 NOTE — BHH Group Notes (Signed)
LCSW Group Therapy Note   05/06/2019 2:35 PM  Type of Therapy and Topic:  Group Therapy:  Overcoming Obstacles   Participation Level:  Active   Description of Group:    In this group patients will be encouraged to explore what they see as obstacles to their own wellness and recovery. They will be guided to discuss their thoughts, feelings, and behaviors related to these obstacles. The group will process together ways to cope with barriers, with attention given to specific choices patients can make. Each patient will be challenged to identify changes they are motivated to make in order to overcome their obstacles. This group will be process-oriented, with patients participating in exploration of their own experiences as well as giving and receiving support and challenge from other group members.   Therapeutic Goals: 1. Patient will identify personal and current obstacles as they relate to admission. 2. Patient will identify barriers that currently interfere with their wellness or overcoming obstacles.  3. Patient will identify feelings, thought process and behaviors related to these barriers. 4. Patient will identify two changes they are willing to make to overcome these obstacles:      Summary of Patient Progress Patient was present in group.  Patient was attentive and supportive of other group members.  Patient was able to identify his values.  Patient shared with others and engaged in group discussions.     Therapeutic Modalities:   Cognitive Behavioral Therapy Solution Focused Therapy Motivational Interviewing Relapse Prevention Therapy  Assunta Curtis, MSW, LCSW 05/06/2019 2:35 PM

## 2019-05-06 NOTE — Progress Notes (Signed)
Recreation Therapy Notes  Date:  3.29.21 Time: 0930 Location: 300 Hall Group Room  Group Topic: Stress Management  Goal Area(s) Addresses:  Patient will identify positive stress management techniques. Patient will identify benefits of using stress management post d/c.  Behavioral Response: Engaged  Intervention: Stress Management  Activity : Meditation.  LRT played a meditation that focused on letting go of the past and living in the moment.  Patients were to listen and follow along as meditation played to engage in activity.  Education:  Stress Management, Discharge Planning.   Education Outcome: Acknowledges Education  Clinical Observations/Feedback: Pt attended and participated in activity.    Victorino Sparrow, LRT/CTRS         Victorino Sparrow A 05/06/2019 10:58 AM

## 2019-05-07 LAB — GLUCOSE, CAPILLARY
Glucose-Capillary: 205 mg/dL — ABNORMAL HIGH (ref 70–99)
Glucose-Capillary: 234 mg/dL — ABNORMAL HIGH (ref 70–99)

## 2019-05-07 MED ORDER — CHOLECALCIFEROL 10 MCG (400 UNIT) PO TABS: 400.0000 [IU] | ORAL_TABLET | Freq: Every day | ORAL | 0 refills | Status: DC

## 2019-05-07 MED ORDER — TAMSULOSIN HCL 0.4 MG PO CAPS: 0.4000 mg | ORAL_CAPSULE | Freq: Every day | ORAL | 0 refills | Status: DC

## 2019-05-07 MED ORDER — GLIPIZIDE 5 MG PO TABS: 5.0000 mg | ORAL_TABLET | Freq: Every day | ORAL | 0 refills | Status: DC

## 2019-05-07 MED ORDER — NALTREXONE HCL 50 MG PO TABS: 50.0000 mg | ORAL_TABLET | Freq: Every day | ORAL | 0 refills | Status: DC

## 2019-05-07 MED ORDER — INSULIN GLARGINE 100 UNIT/ML ~~LOC~~ SOLN: 10.0000 [IU] | Freq: Every day | SUBCUTANEOUS | 0 refills | Status: DC

## 2019-05-07 MED ORDER — ASPIRIN 81 MG PO TBEC: 81.0000 mg | DELAYED_RELEASE_TABLET | Freq: Every day | ORAL | 0 refills | Status: DC

## 2019-05-07 MED ORDER — METFORMIN HCL 850 MG PO TABS: 850.0000 mg | ORAL_TABLET | Freq: Two times a day (BID) | ORAL | 0 refills | Status: DC

## 2019-05-07 NOTE — Progress Notes (Signed)
  [facility] Adult Case Management Discharge Plan :  Will you be returning to the same living situation after discharge:  Yes,  patient is returning home with his wife\ At discharge, do you have transportation home?: Yes,  patient's wife is picking him up  Do you have the ability to pay for your medications: No.  Release of information consent forms completed and in the chart;  Patient's signature needed at discharge.  Patient to Follow up at: Follow-up Information    [facility] on 05/07/2019.   Specialty: Professional Counselor Why: Your next appointment for medication management and SA group therapy services is Tuesday, 05/07/2019 at 6:00pm and/or Thursday, 05/07/2019 at 6:00pm. Be sure to provide dishcharge paperwork from this hospitalization, including your list of medications.  Contact information: [facility] [place] [address] [phone]           Next level of care provider has access to [facility] and Suicide Prevention discussed: Yes,  with the patient's wife  Have you used any form of tobacco in the last 30 days? (Cigarettes, Smokeless Tobacco, Cigars, and/or Pipes): No  Has patient been referred to the Quitline?: N/A patient is not a smoker  Patient has been referred for addiction treatment: Yes  Marylee Floras, [place] 05/07/2019, 10:30 AM

## 2019-05-07 NOTE — Progress Notes (Signed)
Patient ID: Jonathan Austin, male   DOB: 06-Apr-1965, 54 y.o.   MRN: [id number]  Patient discharged per MD orders. AVS discharge packet reviewed with patient. Belongings were returned from beside and assigned locker #[id number]. Follow-up appointments were discussed and importance of continuing the current medication regimen. Patient denies SI, HI, AH, and VH. Pt discharged to lobby where wife was waiting.

## 2019-05-07 NOTE — BHH Suicide Risk Assessment (Signed)
[facility] Discharge Suicide Risk Assessment   Principal Problem: Alcohol use disorder, severe, dependence ([place]) Discharge Diagnoses: Principal Problem:   Alcohol use disorder, severe, dependence ([place]) Active Problems:   MDD (major depressive disorder)   Total Time spent with patient: 30 minutes  Musculoskeletal: Strength & Muscle Tone: within normal limits-no tremors, no diaphoresis, no psychomotor restlessness Gait & Station: normal Patient leans: N/A  Psychiatric Specialty Exam: Review of Systems no headache, no visual disturbances, no chest pain, no shortness of breath  Blood pressure (!) 122/91, pulse 93, temperature 98.2 F (36.8 C), temperature source Oral, resp. rate 18, height 5\' 7"  (1.702 m), weight 97.5 kg, SpO2 100 %.Body mass index is 33.67 kg/m.  General Appearance: Well Groomed  Eye Contact::  Good  Speech:  Normal Rate409  Volume:  Normal  Mood:  Reports feeling better, describes improved mood  Affect:  Appropriate and Reactive, smiles at times appropriately during session  Thought Process:  Linear and Descriptions of Associations: Intact  Orientation:  Full (Time, Place, and Person)  Thought Content:  No hallucinations, no delusions, not internally preoccupied  Suicidal Thoughts:  No currently denies suicidal or self-injurious ideations  Homicidal Thoughts:  No denies homicidal or violent ideations  Memory:  Recent and remote grossly intact  Judgement:  Other:  Improving  Insight:  Improving  Psychomotor Activity:  Normal  Concentration:  Good  Recall:  Good  Fund of Knowledge:Good  Language: Good  Akathisia:  Negative  Handed:  Right  AIMS (if indicated):     Assets:  Communication Skills Desire for Improvement Resilience  Sleep:  Number of Hours: 6  Cognition: WNL  ADL's:  Intact   Mental Status Per Nursing Assessment::   On Admission:  Suicidal ideation indicated by patient  Demographic Factors:  54 year old married male  Loss Factors: Relapse,  recently diagnosed with prostatic cancer, recent anniversary of son's death  Historical Factors: Depression, alcohol/cocaine use disorder  Risk Reduction Factors:   Living with another person, especially a relative, Positive social support and Positive coping skills or problem solving skills  Continued Clinical Symptoms:  Currently patient reports feeling better than he did prior to admission.  He presents alert, attentive, well groomed, calm, pleasant/polite on approach.  Speech normal.  Mood described as improved.  Affect presents reactive and fuller in range.  No thought disorders noted.  Denies SI, denies HI, no hallucinations/no delusions expressed.  Presents future oriented and states he is eager to start treatment for prostate cancer.  States he feels optimistic that treatment will be effective. At this time he is not endorsing or presenting with symptoms of alcohol withdrawal.  No tremors are noted.  No psychomotor agitation or restlessness.  Does not appear to be in any acute distress.  BP 122/91, pulse 93. Currently he is tolerating medications well and denies side effects. With his expressed consent I have spoken with his wife via phone.  She confirms that he is currently stable and that he has improved since admission, and has no concerns about him being discharged at this time.  She states she will pick them up later today. Currently patient denies medication side effects. Behavior on unit in good control.  Cognitive Features That Contribute To Risk:  No gross cognitive deficits noted upon discharge. Is alert , attentive, and oriented x 3   Suicide Risk:  Mild:  Suicidal ideation of limited frequency, intensity, duration, and specificity.  There are no identifiable plans, no associated intent, mild dysphoria and related symptoms,  good self-control (both objective and subjective assessment), few other risk factors, and identifiable protective factors, including available and  accessible social support.  Follow-up Information    [facility] on 05/09/2019.   Specialty: Professional Counselor Why: Appointments for medication management and substance abuse group therapy are held every Tuesday and Thursday evening at 6:00 pm in person.   Contact information: [facility] [id number] [phone]        Services, [facility] Follow up.   Contact information: [address] [place] [address] [phone]        [facility] Follow up.   Contact information: [place] [address] [id number]           Plan Of Care/Follow-up recommendations:  Activity:  As tolerated Diet:  Diabetic diet Tests:  NA Other:  See below  Patient expresses readiness for discharge.  There are no current grounds for involuntary commitment.  He is leaving unit in good spirits.  He plans to return home/his wife will pick them up later today.  He plans to initiate prostate cancer treatment in the near future.  He has an established PCP for ongoing management of medical illnesses ( Dr. Maida Sale)   Jenne Campus, MD 05/07/2019, 10:22 AM

## 2019-05-07 NOTE — Discharge Summary (Addendum)
Physician Discharge Summary Note  Patient:  Jonathan Austin is an 54 y.o., male MRN:  [id number] DOB:  12/19/1965 Patient phone:  [phone] (home)  Patient address:   [address] [place] [address],  Total Time spent with patient: 15 minutes  Date of Admission:  05/04/2019 Date of Discharge: 05/07/19  Reason for Admission:  Alcohol dependence with suicidal ideation  Principal Problem: Alcohol use disorder, severe, dependence ([place]) Discharge Diagnoses: Principal Problem:   Alcohol use disorder, severe, dependence ([place]) Active Problems:   MDD (major depressive disorder)   Past Psychiatric History: Patient has a longstanding history of alcohol and cocaine dependence.  He has had several psychiatric hospitalizations to our facility.  His last psychiatric hospitalization with [place] was 01/24/2018.  Past Medical History:  Past Medical History:  Diagnosis Date  . Cellulitis 06/28/2013  . Cocaine abuse ([place]) 06/28/2013  . Cocaine abuse with cocaine-induced mood disorder ([place])   . Depression   . Diabetes mellitus   . High cholesterol   . Hypertension   . Prostate cancer ([place])   . Scrotal infection 06/28/2013  . Sleep apnea   . Suicidal ideations     Past Surgical History:  Procedure Laterality Date  . PROSTATE BIOPSY    . TONSILLECTOMY     Family History:  Family History  Problem Relation Age of Onset  . Hypertension Mother   . Hypertension Father   . Diabetes Father   . Prostate cancer Maternal Grandfather 96  . Breast cancer Neg Hx   . Colon cancer Neg Hx   . Pancreatic cancer Neg Hx    Family Psychiatric  History: He has several secondary relatives who have attempted suicide and committed suicide in the past.  He had a great uncle who burned himself while drinking alcohol.  He had poured gasoline over himself.  Social History:  Social History   Substance and Sexual Activity  Alcohol Use Yes   Comment: drinks beer 40 oz x 2 daily     Social History    Substance and Sexual Activity  Drug Use Yes  . Types: Cocaine, Marijuana   Comment: smokes 2g crack daily     Social History   Socioeconomic History  . Marital status: Married    Spouse name: Doris  . Number of children: 1  . Years of education: Not on file  . Highest education level: Not on file  Occupational History  . Not on file  Tobacco Use  . Smoking status: Never Smoker  . Smokeless tobacco: Never Used  Substance and Sexual Activity  . Alcohol use: Yes    Comment: drinks beer 40 oz x 2 daily  . Drug use: Yes    Types: Cocaine, Marijuana    Comment: smokes 2g crack daily   . Sexual activity: Yes  Other Topics Concern  . Not on file  Social History Narrative   Only child is deceased   Social Determinants of Radio broadcast assistant Strain:   . Difficulty of Paying Living Expenses:   Food Insecurity:   . Worried About Charity fundraiser in the Last Year:   . Arboriculturist in the Last Year:   Transportation Needs:   . Film/video editor (Medical):   Marland Kitchen Lack of Transportation (Non-Medical):   Physical Activity:   . Days of Exercise per Week:   . Minutes of Exercise per Session:   Stress:   . Feeling of Stress :   Social Connections:   .  Frequency of Communication with Friends and Family:   . Frequency of Social Gatherings with Friends and Family:   . Attends Religious Services:   . Active Member of Clubs or Organizations:   . Attends Archivist Meetings:   Marland Kitchen Marital Status:     Hospital Course: From admission H&P: Patient is a 54 year old male with a past psychiatric history significant for alcohol dependence, cocaine dependence and previous unresolved grief issues who presented to the [facility] emergency department on 05/03/19 with suicidal ideation. The patient stated he had had recent increase in stressors. The patient stated that he always has stress around the time of the anniversary of the death of his son. His  son died 6 years ago, and today is the anniversary of his death. Additionally he recently found out that he had prostate cancer, and that is causing him a great deal of stress. His last psychiatric hospitalization was at our facility in December 2019. He stated that shortly after he was discharged there was a parole violation, and he was in jail for approximately 30 days. That was approximately February of 2020. After he was discharged he became involved with family services, and remained sober until approximately November/December 2020. He stated he had been doing very well, but slipped up and relapsed. Since then he has been using cocaine and alcohol. He admitted to helplessness, hopelessness or worthlessness. He admitted to suicidal ideation. He was admitted to the hospital for evaluation and stabilization.   Mr. Wingert was admitted for alcohol and cocaine dependence with suicidal ideation. He remained on the [facility] unit for three days. He was started on CIWA protocol with Ativan PRN CIWA>10. He showed no signs or symptoms of withdrawal. Naltrexone was started for alcohol use disorder; patient reports this medication was helpful for maintaining sobriety in the past. He participated in group therapy on the unit. Hemoglobin a1c was 11.4 on 05/04/19. Metformin, glipizide, and Lantus were increased. He responded well to treatment with no adverse effects reported. He has shown improved mood, affect, sleep, and interaction. He reports mood is significantly improved after detox from cocaine and alcohol. He is future-oriented, requesting discharge for meeting with insurance company today to cover prostate cancer treatment starting later this week. He plans to resume group therapy and individual therapy at [facility] (see below), as well as AA/NA. He denies cravings for cocaine and alcohol and states he has a history of doing well when he is participating in treatment at [facility] and AA/NA. He denies any  SI/HI/AVH and contracts for safety. He denies withdrawal symptoms. He is discharging on the medications listed below. Patient agrees to follow up with established PCP for ongoing management of diabetes. He is provided with prescriptions and medication samples upon discharge. His wife is picking him up for discharge home.  Physical Findings: AIMS: Facial and Oral Movements Muscles of Facial Expression: None, normal Lips and Perioral Area: None, normal Jaw: None, normal Tongue: None, normal,Extremity Movements Upper (arms, wrists, hands, fingers): None, normal Lower (legs, knees, ankles, toes): None, normal, Trunk Movements Neck, shoulders, hips: None, normal, Overall Severity Severity of abnormal movements (highest score from questions above): None, normal Incapacitation due to abnormal movements: None, normal Patient's awareness of abnormal movements (rate only patient's report): No Awareness, Dental Status Current problems with teeth and/or dentures?: No Does patient usually wear dentures?: No  CIWA:  CIWA-Ar Total: 1 COWS:     Musculoskeletal: Strength & Muscle Tone: within normal limits Gait &  Station: normal Patient leans: N/A  Psychiatric Specialty Exam: Physical Exam  Nursing note and vitals reviewed. Constitutional: He is oriented to person, place, and time. He appears well-developed and well-nourished.  Cardiovascular: Normal rate.  Respiratory: Effort normal.  Neurological: He is alert and oriented to person, place, and time.    Review of Systems  Constitutional: Negative.   Respiratory: Negative for cough and shortness of breath.   Psychiatric/Behavioral: Negative for agitation, behavioral problems, confusion, dysphoric mood, hallucinations, self-injury, sleep disturbance and suicidal ideas. The patient is not nervous/anxious and is not hyperactive.     Blood pressure (!) 122/91, pulse 93, temperature 98.2 F (36.8 C), temperature source Oral, resp. rate 18, height 5'  7" (1.702 m), weight 97.5 kg, SpO2 100 %.Body mass index is 33.67 kg/m.  See MD's discharge SRA    Have you used any form of tobacco in the last 30 days? (Cigarettes, Smokeless Tobacco, Cigars, and/or Pipes): No  Has this patient used any form of tobacco in the last 30 days? (Cigarettes, Smokeless Tobacco, Cigars, and/or Pipes)  No  Blood Alcohol level:  Lab Results  Component Value Date   ETH <10 05/03/2019   ETH <10 55/73/2202    Metabolic Disorder Labs:  Lab Results  Component Value Date   HGBA1C 11.4 (H) 05/04/2019   MPG 280.48 05/04/2019   MPG 291.96 05/03/2019   No results found for: PROLACTIN Lab Results  Component Value Date   CHOL 265 (H) 08/31/2014   TRIG 351 (H) 08/31/2014   HDL 49 08/31/2014   CHOLHDL 5.4 08/31/2014   VLDL 70 (H) 08/31/2014   LDLCALC 146 (H) 08/31/2014   LDLCALC 163 (H) 05/07/2014    See Psychiatric Specialty Exam and Suicide Risk Assessment completed by Attending Physician prior to discharge.  Discharge destination:  Home  Is patient on multiple antipsychotic therapies at discharge:  No   Has Patient had three or more failed trials of antipsychotic monotherapy by history:  No  Recommended Plan for Multiple Antipsychotic Therapies: NA  Discharge Instructions    Discharge instructions   Complete by: As directed    Patient is instructed to take all prescribed medications as recommended. Report any side effects or adverse reactions to your outpatient psychiatrist. Patient is instructed to abstain from alcohol and illegal drugs while on prescription medications. In the event of worsening symptoms, patient is instructed to call the crisis hotline, 911, or go to the nearest emergency department for evaluation and treatment.     Allergies as of 05/07/2019   No Known Allergies     Medication List    STOP taking these medications   freestyle lancets   glucose blood test strip Commonly known as: FREESTYLE TEST STRIPS   glucose  monitoring kit monitoring kit   hydrOXYzine 25 MG tablet Commonly known as: ATARAX/VISTARIL   prenatal multivitamin Tabs tablet   traZODone 50 MG tablet Commonly known as: DESYREL     TAKE these medications     Indication  amLODipine 10 MG tablet Commonly known as: NORVASC Take 1 tablet (10 mg total) by mouth daily. For high blood pressure  Indication: High Blood Pressure Disorder   aspirin 81 MG EC tablet Take 1 tablet (81 mg total) by mouth daily. Swallow whole. Start taking on: May 08, 2019  Indication: Antiplatelet   cholecalciferol 10 MCG (400 UNIT) Tabs tablet Commonly known as: VITAMIN D3 Take 1 tablet (400 Units total) by mouth daily. Start taking on: May 08, 2019  Indication: Supplementation  glipiZIDE 5 MG tablet Commonly known as: GLUCOTROL Take 1 tablet (5 mg total) by mouth daily before breakfast. Start taking on: May 08, 2019  Indication: Type 2 Diabetes   insulin glargine 100 UNIT/ML injection Commonly known as: LANTUS Inject 0.1 mLs (10 Units total) into the skin at bedtime. What changed:   how much to take  additional instructions  Indication: Type 2 Diabetes   Lipitor 10 MG tablet Generic drug: atorvastatin Take 10 mg by mouth daily.  Indication: High Amount of Fats in the Blood   metFORMIN 850 MG tablet Commonly known as: GLUCOPHAGE Take 1 tablet (850 mg total) by mouth 2 (two) times daily with a meal. What changed:   medication strength  how much to take  additional instructions  Indication: Type 2 Diabetes   naltrexone 50 MG tablet Commonly known as: DEPADE Take 1 tablet (50 mg total) by mouth daily. Start taking on: May 08, 2019 What changed: additional instructions  Indication: Abuse or Misuse of Alcohol   tamsulosin 0.4 MG Caps capsule Commonly known as: FLOMAX Take 1 capsule (0.4 mg total) by mouth daily after supper.  Indication: Benign Enlargement of Prostate      Follow-up Information    [facility] on 05/07/2019.   Specialty: Professional Counselor Why: Your next appointment for medication management and SA group therapy services is Tuesday, 05/07/2019 at 6:00pm and/or Thursday, 05/07/2019 at 6:00pm. Be sure to provide dishcharge paperwork from this hospitalization, including your list of medications.  Contact information: [facility] [place] [address] [phone]           Follow-up recommendations: Activity as tolerated. Diet as recommended by primary care physician. Keep all scheduled follow-up appointments as recommended.   Comments:   Patient is instructed to take all prescribed medications as recommended. Report any side effects or adverse reactions to your outpatient psychiatrist. Patient is instructed to abstain from alcohol and illegal drugs while on prescription medications. In the event of worsening symptoms, patient is instructed to call the crisis hotline, 911, or go to the nearest emergency department for evaluation and treatment.  Signed: Connye Burkitt, NP 05/07/2019, 10:37 AM   Patient seen, Suicide Assessment Completed.  Disposition Plan Reviewed

## 2019-05-09 ENCOUNTER — Encounter: Payer: Self-pay | Admitting: Medical Oncology

## 2019-05-09 NOTE — Progress Notes (Signed)
Spoke with patient as follow up to [facility]. He states he is doing well and no new questions. He is scheduled 4/12 for follow up with Dr. Lovena Neighbours to receive ADT and discuss TURP. He confirmed this appointment. He is aware post TURP, Enid Derry will be in contact to schedule appointments for radiation planning. I shared information about our prostate support group and encouraged him to join for support. He gave me his e-mail address and states he will try to join in April. I asked him to call me with questions or concerns. He voiced understanding.

## 2019-05-09 NOTE — Progress Notes (Signed)
Attempted to call patient as follow up to [facility] 3/23, but mail box is full. No other numbers listed for patient.

## 2019-05-19 IMAGING — CT CT HEAD W/O CM
4 series · 15 of 47 positions shown, 17 images · non-contrast
Comparison: None.

CLINICAL DATA: Depression, suicidal thoughts, cocaine use 1 hour
ago.

EXAM:
CT HEAD WITHOUT CONTRAST
TECHNIQUE: Contiguous axial images were obtained from the base of the skull
through the vertex without intravenous contrast.

[Series 3: head without · axial · non-contrast · 0.43mm/px · z∈[-34,+81]mm · 7 of 31 slices shown, 9 images]
[im 4/31  brain]
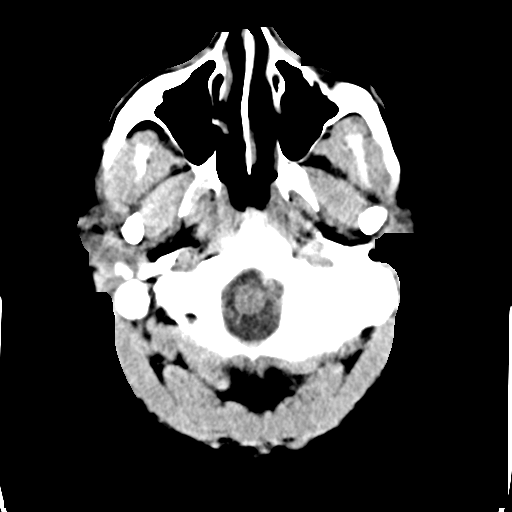
[im 4/31  bone]
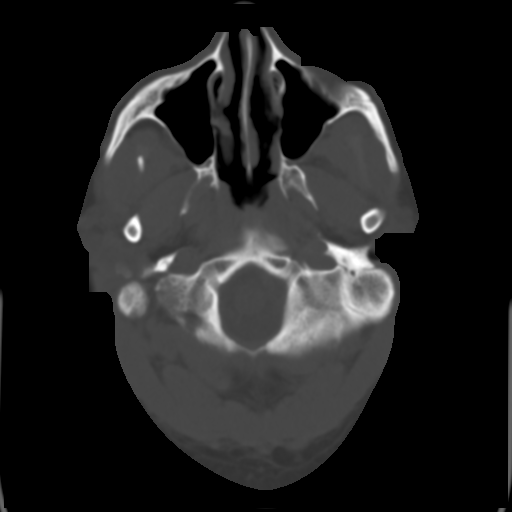
[im 8/31  brain]
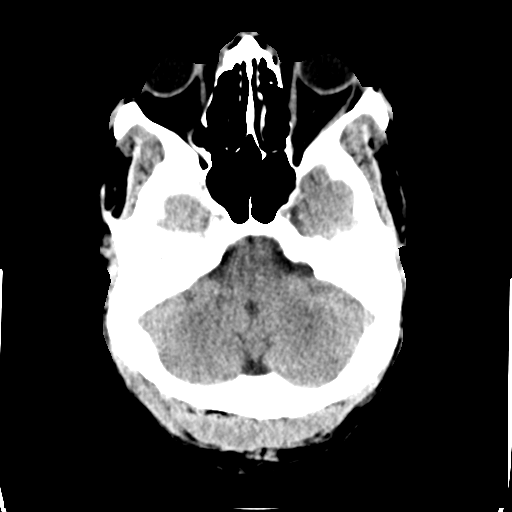
[im 12/31  brain]
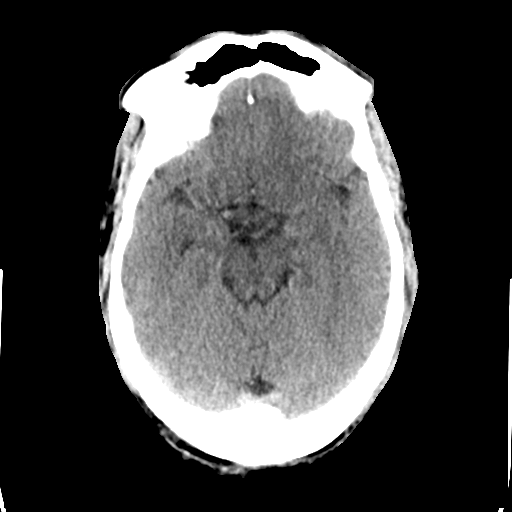
[im 16/31  brain]
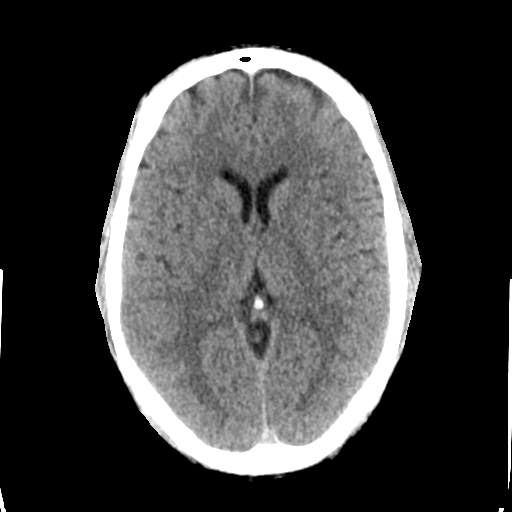
[im 19/31  brain]
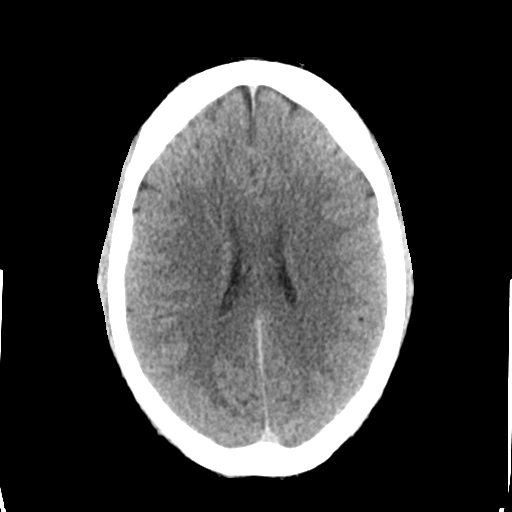
[im 19/31  bone]
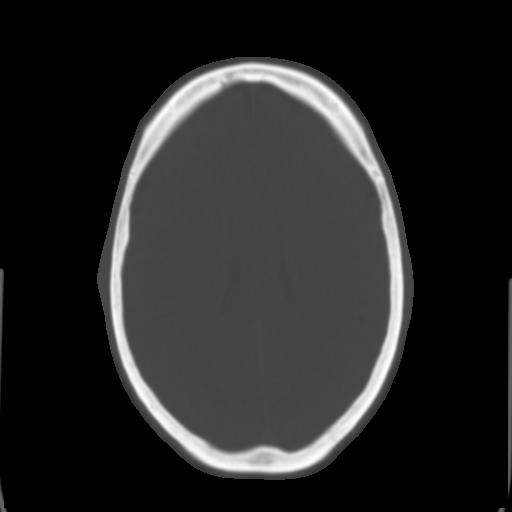
[im 23/31  brain]
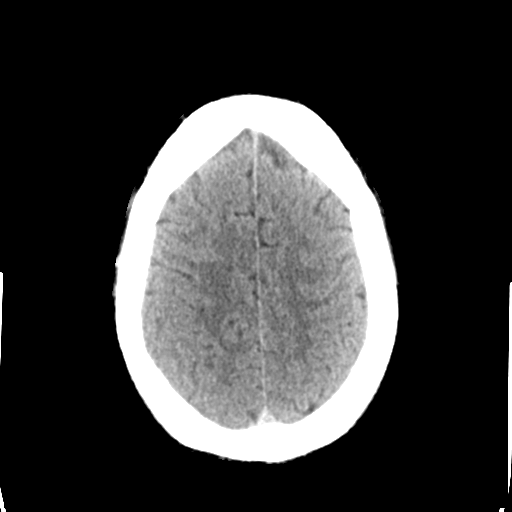
[im 27/31  brain]
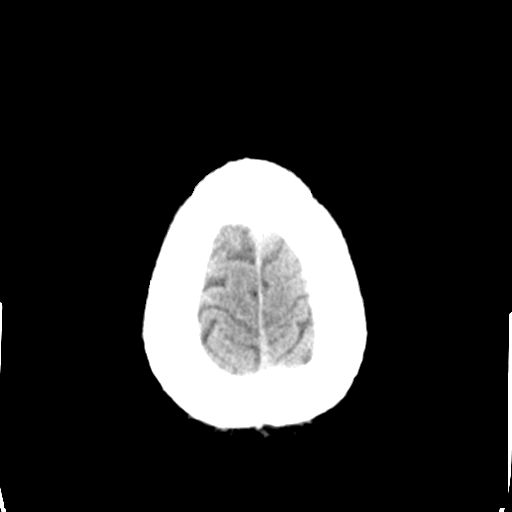

[Series 4: head bone · axial · 0.43mm/px · z∈[-35,-19]mm · 2 of 76 slices shown]
[im 8/76  bone]
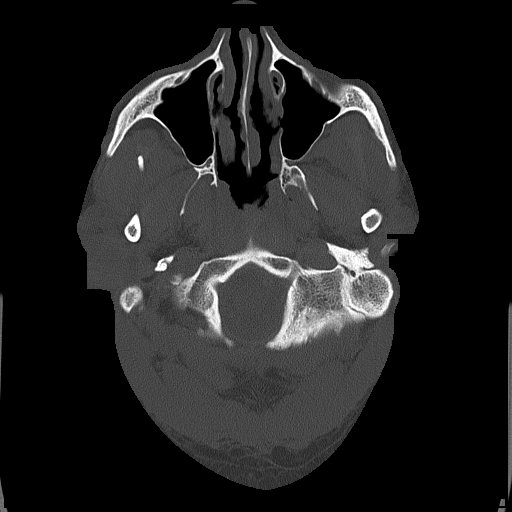
[im 16/76  bone]
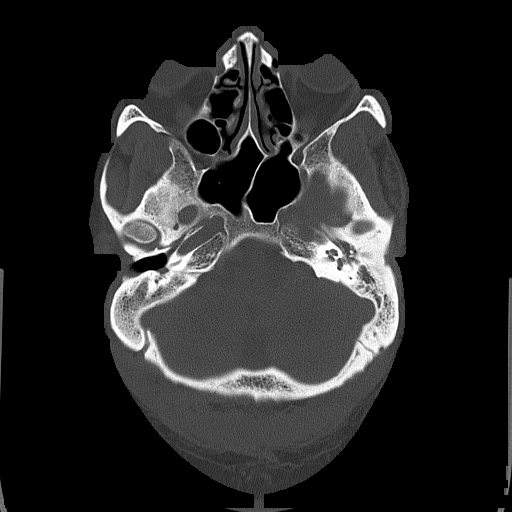

[Series 5: head without cor · coronal · non-contrast · 0.29mm/px · 3 of 69 slices shown]
[im 23/69  brain]
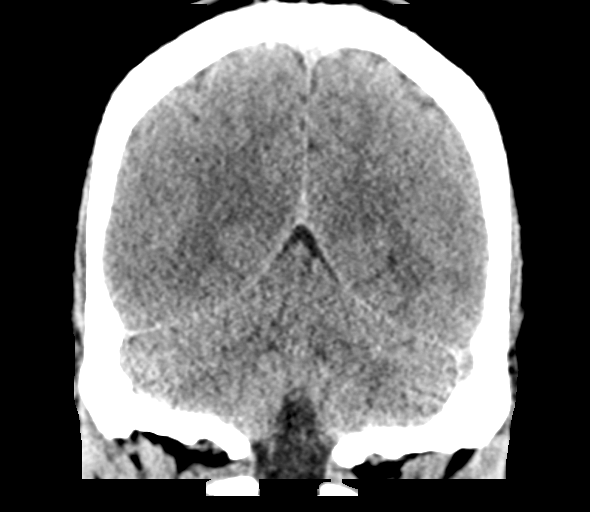
[im 31/69  brain]
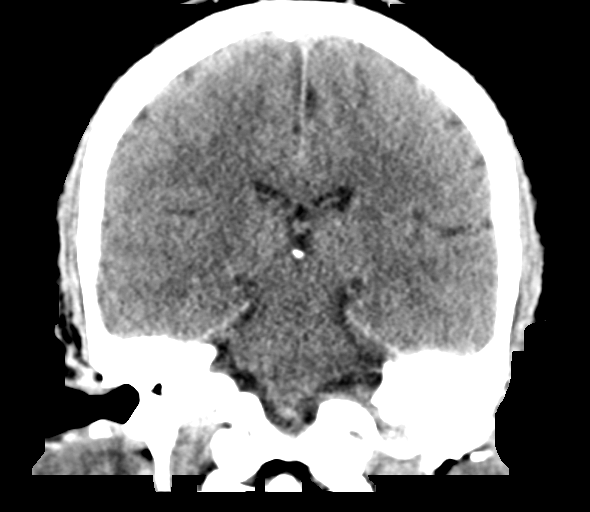
[im 38/69  brain]
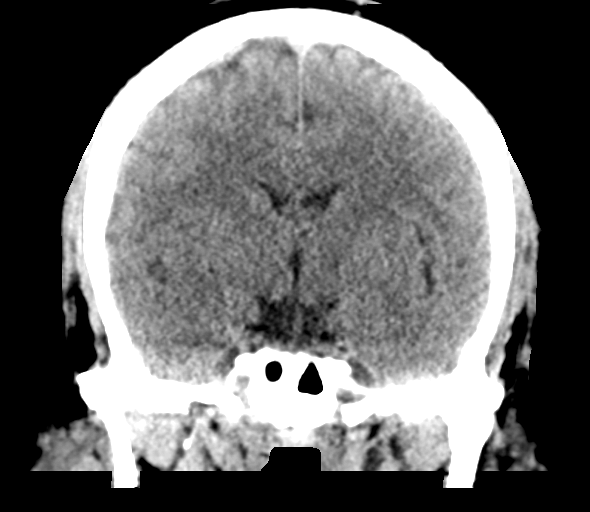

[Series 6: head without sag · sagittal · non-contrast · 0.30mm/px · 3 of 54 slices shown]
[im 18/54  brain]
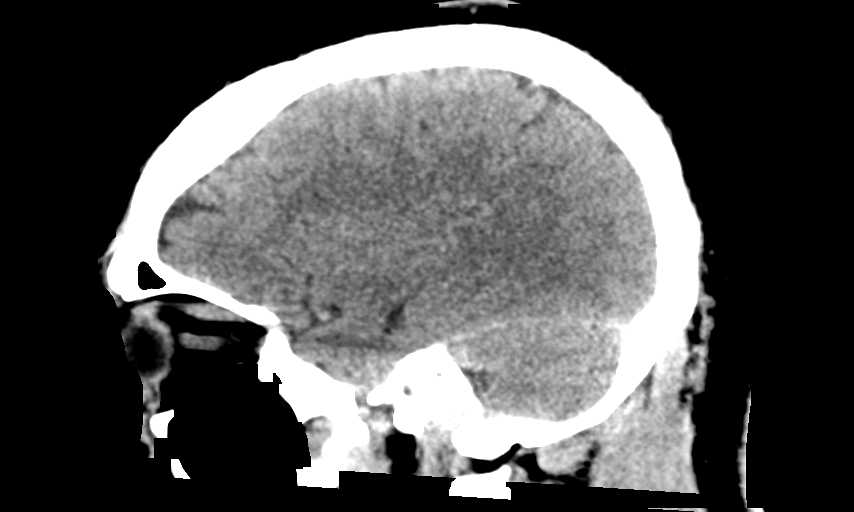
[im 27/54  brain]
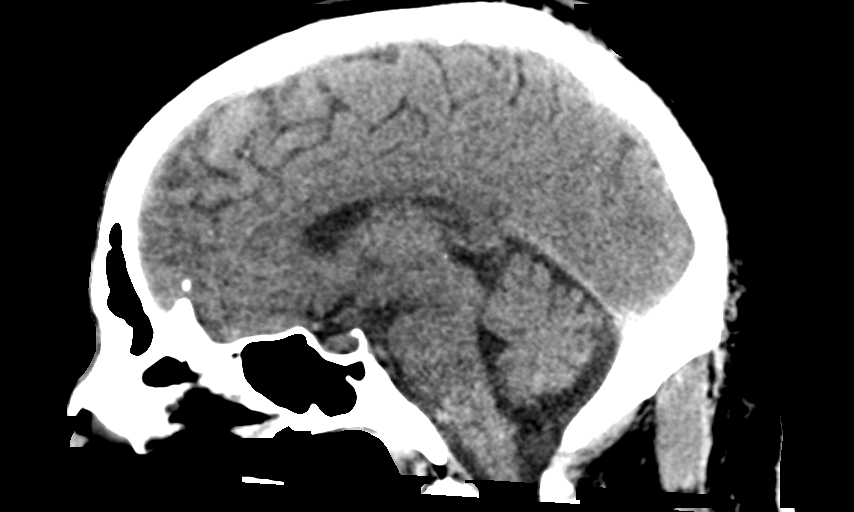
[im 36/54  brain]
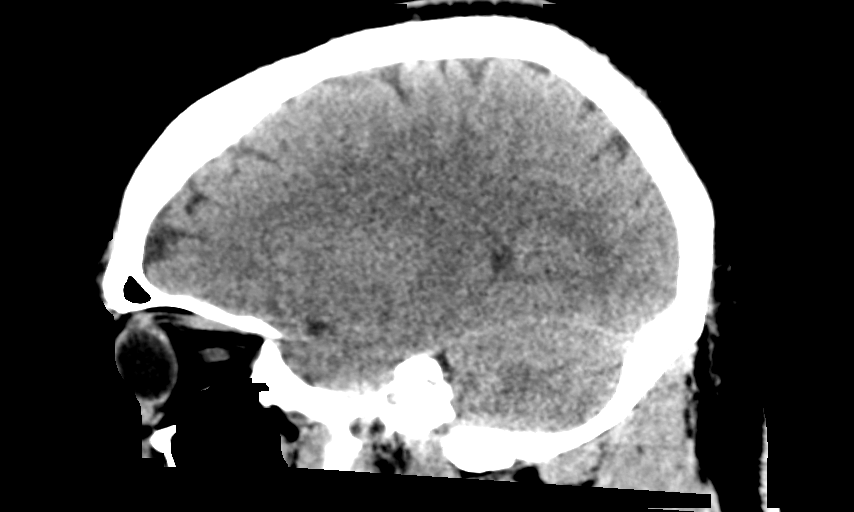

[15 of 47 positions shown; findings below may reference images not displayed]

FINDINGS: Brain: No evidence of acute infarction, hemorrhage, hydrocephalus,
extra-axial collection or mass lesion/mass effect.

Vascular: No hyperdense vessel or unexpected calcification.

Skull: Normal. Negative for fracture or focal lesion.

Sinuses/Orbits: No acute finding.

Other: None
IMPRESSION: No acute intracranial abnormality.

## 2019-05-22 ENCOUNTER — Encounter (HOSPITAL_COMMUNITY): Payer: Self-pay

## 2019-05-22 DIAGNOSIS — F320 Major depressive disorder, single episode, mild: Secondary | ICD-10-CM

## 2019-05-22 NOTE — BH Assessment (Signed)
Q-Actual   Writer attempted to contact the patient in order to complete the PHQ 2-9.  Writer did not reach the patient and left a voice mail message.

## 2019-05-23 ENCOUNTER — Other Ambulatory Visit: Payer: Self-pay | Admitting: Urology

## 2019-05-23 ENCOUNTER — Encounter (HOSPITAL_BASED_OUTPATIENT_CLINIC_OR_DEPARTMENT_OTHER): Payer: Self-pay | Admitting: Urology

## 2019-05-24 ENCOUNTER — Other Ambulatory Visit: Payer: Self-pay

## 2019-05-24 ENCOUNTER — Encounter (HOSPITAL_BASED_OUTPATIENT_CLINIC_OR_DEPARTMENT_OTHER): Payer: Self-pay | Admitting: Urology

## 2019-05-24 NOTE — Progress Notes (Addendum)
ADDENDUM:  Chart reviewed by anesthesia, Konrad Felix PA,  Pt will need to assessed dos with UDS results and MDA.   Spoke w/ via phone for pre-op interview--- PT Lab needs dos----  Istat 8, EKG, Urine drug screen             Lab results------ no COVID test ------ 05-25-2019 @ 1215 Arrive at ------- 0830 NPO after ------ [place] Medications to take morning of surgery ----- Lipitor, Norvasc, Naltrexone w/ sips of water Diabetic medication ----- do not take metformin / glipizide or do insulin morning of surgery.  Night before surgery do 70% of normal dose  insulin 70/30 (8.5 units) Patient Special Instructions ----- asked to bring cpap/ mask/ tubing dos with him Pre-Op special Istructions ----- n/a Patient verbalized understanding of instructions that were given at this phone interview. Patient denies shortness of breath, chest pain, fever, cough a this phone interview.   Anesthesia Review:  Cocaine/ alcohol abuse.  Admitted to [facility] 05-04-2019 for suicide ideation , alcohol / cocaine detox, and discharged 05-07-2019 (note in epic).  Pt stated has not had alcohol / cocaine since 05-03-2019 and is take naltrexone daily as prescribed.  Pt denies any hx of withdrawal symptoms.  Hx HTN, DM2, OSA w/ cpap, local advanced prostate cancer. Chart to be reviewed by Konrad Felix PA.  PCP:  [facility] [place] Cardiologist : no Chest x-ray : 05-09-2016 epic EKG : 08-30-2016 epic Echo : 02-13-2014 epic Stress test: no Cardiac Cath :  no Sleep Study/ CPAP : YES/YES Fasting Blood Sugar :  180--200    / Checks Blood Sugar -- times a day:  2 times daily Blood Thinner/ Instructions /Last Dose: NO ASA / Instructions/ Last Dose :  ASA 81mg / pt stated given instructions from dr winter office to stop prior to surgery/  Last dose last week

## 2019-05-25 ENCOUNTER — Other Ambulatory Visit (HOSPITAL_COMMUNITY)
Admission: RE | Admit: 2019-05-25 | Discharge: 2019-05-25 | Disposition: A | Discharge status: Home / Self Care | Payer: 59 | Source: Ambulatory Visit | Attending: Urology | Admitting: Urology

## 2019-05-25 DIAGNOSIS — Z01812 Encounter for preprocedural laboratory examination: Secondary | ICD-10-CM | POA: Insufficient documentation

## 2019-05-25 DIAGNOSIS — Z20822 Contact with and (suspected) exposure to covid-19: Secondary | ICD-10-CM | POA: Diagnosis not present

## 2019-05-25 LAB — SARS CORONAVIRUS 2 (TAT 6-24 HRS): SARS Coronavirus 2: NEGATIVE

## 2019-05-27 ENCOUNTER — Encounter: Payer: Self-pay | Admitting: Medical Oncology

## 2019-05-29 ENCOUNTER — Encounter (HOSPITAL_COMMUNITY): Payer: Self-pay

## 2019-05-29 ENCOUNTER — Encounter: Payer: Self-pay | Admitting: Urology

## 2019-05-29 ENCOUNTER — Ambulatory Visit (HOSPITAL_BASED_OUTPATIENT_CLINIC_OR_DEPARTMENT_OTHER)
Admission: RE | Admit: 2019-05-29 | Discharge: 2019-05-29 | Disposition: A | Discharge status: Home / Self Care | Payer: 59 | Source: Ambulatory Visit | Attending: Urology | Admitting: Urology

## 2019-05-29 ENCOUNTER — Encounter (HOSPITAL_BASED_OUTPATIENT_CLINIC_OR_DEPARTMENT_OTHER)
Admission: RE | Disposition: A | Discharge status: Home / Self Care | Payer: Self-pay | Source: Ambulatory Visit | Attending: Urology

## 2019-05-29 ENCOUNTER — Encounter (HOSPITAL_BASED_OUTPATIENT_CLINIC_OR_DEPARTMENT_OTHER): Payer: Self-pay | Admitting: Urology

## 2019-05-29 ENCOUNTER — Ambulatory Visit (HOSPITAL_BASED_OUTPATIENT_CLINIC_OR_DEPARTMENT_OTHER): Payer: 59 | Admitting: Physician Assistant

## 2019-05-29 ENCOUNTER — Other Ambulatory Visit: Payer: Self-pay

## 2019-05-29 DIAGNOSIS — C61 Malignant neoplasm of prostate: Secondary | ICD-10-CM | POA: Insufficient documentation

## 2019-05-29 DIAGNOSIS — N400 Enlarged prostate without lower urinary tract symptoms: Secondary | ICD-10-CM | POA: Insufficient documentation

## 2019-05-29 DIAGNOSIS — R3915 Urgency of urination: Secondary | ICD-10-CM | POA: Diagnosis not present

## 2019-05-29 DIAGNOSIS — Z8249 Family history of ischemic heart disease and other diseases of the circulatory system: Secondary | ICD-10-CM | POA: Diagnosis not present

## 2019-05-29 DIAGNOSIS — N320 Bladder-neck obstruction: Secondary | ICD-10-CM | POA: Diagnosis present

## 2019-05-29 DIAGNOSIS — N471 Phimosis: Secondary | ICD-10-CM | POA: Diagnosis not present

## 2019-05-29 DIAGNOSIS — F17210 Nicotine dependence, cigarettes, uncomplicated: Secondary | ICD-10-CM | POA: Insufficient documentation

## 2019-05-29 DIAGNOSIS — E7800 Pure hypercholesterolemia, unspecified: Secondary | ICD-10-CM | POA: Diagnosis not present

## 2019-05-29 DIAGNOSIS — I10 Essential (primary) hypertension: Secondary | ICD-10-CM | POA: Insufficient documentation

## 2019-05-29 DIAGNOSIS — E119 Type 2 diabetes mellitus without complications: Secondary | ICD-10-CM | POA: Diagnosis not present

## 2019-05-29 DIAGNOSIS — Z833 Family history of diabetes mellitus: Secondary | ICD-10-CM | POA: Diagnosis not present

## 2019-05-29 DIAGNOSIS — F320 Major depressive disorder, single episode, mild: Secondary | ICD-10-CM

## 2019-05-29 HISTORY — DX: Personal history of other mental and behavioral disorders: Z86.59

## 2019-05-29 HISTORY — DX: Phimosis: N47.1

## 2019-05-29 HISTORY — DX: Presence of spectacles and contact lenses: Z97.3

## 2019-05-29 HISTORY — DX: Benign prostatic hyperplasia with lower urinary tract symptoms: N40.1

## 2019-05-29 HISTORY — PX: TRANSURETHRAL RESECTION OF PROSTATE: SHX73

## 2019-05-29 HISTORY — DX: Alcohol dependence, uncomplicated: F10.20

## 2019-05-29 HISTORY — DX: Type 2 diabetes mellitus without complications: E11.9

## 2019-05-29 HISTORY — DX: Obstructive sleep apnea (adult) (pediatric): G47.33

## 2019-05-29 HISTORY — PX: CIRCUMCISION: SHX1350

## 2019-05-29 LAB — RAPID URINE DRUG SCREEN, HOSP PERFORMED
Amphetamines: NOT DETECTED
Barbiturates: NOT DETECTED
Benzodiazepines: NOT DETECTED
Cocaine: POSITIVE — AB
Opiates: NOT DETECTED
Tetrahydrocannabinol: NOT DETECTED

## 2019-05-29 LAB — POCT I-STAT, CHEM 8
BUN: 12 mg/dL (ref 6–20)
Calcium, Ion: 1.22 mmol/L (ref 1.15–1.40)
Chloride: 100 mmol/L (ref 98–111)
Creatinine, Ser: 0.9 mg/dL (ref 0.61–1.24)
Glucose, Bld: 103 mg/dL — ABNORMAL HIGH (ref 70–99)
HCT: 46 % (ref 39.0–52.0)
Hemoglobin: 15.6 g/dL (ref 13.0–17.0)
Potassium: 3.6 mmol/L (ref 3.5–5.1)
Sodium: 140 mmol/L (ref 135–145)
TCO2: 28 mmol/L (ref 22–32)

## 2019-05-29 LAB — GLUCOSE, CAPILLARY: Glucose-Capillary: 120 mg/dL — ABNORMAL HIGH (ref 70–99)

## 2019-05-29 SURGERY — TURP (TRANSURETHRAL RESECTION OF PROSTATE)
Anesthesia: General | Site: Prostate

## 2019-05-29 MED ORDER — PHENAZOPYRIDINE HCL 200 MG PO TABS: 200.0000 mg | ORAL_TABLET | Freq: Three times a day (TID) | ORAL | 0 refills | Status: DC | PRN

## 2019-05-29 MED ORDER — CEPHALEXIN 500 MG PO CAPS: 500.0000 mg | ORAL_CAPSULE | Freq: Two times a day (BID) | ORAL | 0 refills | Status: AC

## 2019-05-29 MED ORDER — OXYCODONE HCL 5 MG PO TABS: 5.0000 mg | ORAL_TABLET | Freq: Once | ORAL | Status: DC | PRN

## 2019-05-29 MED ORDER — HYDROCODONE-ACETAMINOPHEN 5-325 MG PO TABS: 1.0000 | ORAL_TABLET | ORAL | 0 refills | Status: DC | PRN

## 2019-05-29 MED ORDER — LIDOCAINE 2% (20 MG/ML) 5 ML SYRINGE
INTRAMUSCULAR | Status: AC
  Filled 2019-05-29: qty 5

## 2019-05-29 MED ORDER — FENTANYL CITRATE (PF) 100 MCG/2ML IJ SOLN
INTRAMUSCULAR | Status: AC
  Filled 2019-05-29: qty 2

## 2019-05-29 MED ORDER — FENTANYL CITRATE (PF) 100 MCG/2ML IJ SOLN
INTRAMUSCULAR | Status: DC | PRN
  Administered 2019-05-29: 50 ug via INTRAVENOUS
  Administered 2019-05-29: 25 ug via INTRAVENOUS
  Administered 2019-05-29: 50 ug via INTRAVENOUS
  Administered 2019-05-29: 25 ug via INTRAVENOUS

## 2019-05-29 MED ORDER — KETOROLAC TROMETHAMINE 30 MG/ML IJ SOLN: 15.0000 mg | Freq: Once | INTRAMUSCULAR | Status: DC | PRN

## 2019-05-29 MED ORDER — HYDROMORPHONE HCL 1 MG/ML IJ SOLN: 0.2500 mg | INTRAMUSCULAR | Status: DC | PRN

## 2019-05-29 MED ORDER — OXYCODONE HCL 5 MG/5ML PO SOLN: 5.0000 mg | Freq: Once | ORAL | Status: DC | PRN

## 2019-05-29 MED ORDER — LIDOCAINE HCL (CARDIAC) PF 100 MG/5ML IV SOSY
PREFILLED_SYRINGE | INTRAVENOUS | Status: DC | PRN
  Administered 2019-05-29: 100 mg via INTRAVENOUS

## 2019-05-29 MED ORDER — ONDANSETRON HCL 4 MG/2ML IJ SOLN
INTRAMUSCULAR | Status: DC | PRN
  Administered 2019-05-29: 4 mg via INTRAVENOUS

## 2019-05-29 MED ORDER — GENTAMICIN SULFATE 40 MG/ML IJ SOLN
5.0000 mg/kg | INTRAVENOUS | Status: AC
  Administered 2019-05-29: 12:00:00 397.5 mg via INTRAVENOUS
  Filled 2019-05-29: qty 10

## 2019-05-29 MED ORDER — KETOROLAC TROMETHAMINE 30 MG/ML IJ SOLN
INTRAMUSCULAR | Status: AC
  Filled 2019-05-29: qty 1

## 2019-05-29 MED ORDER — BUPIVACAINE HCL 0.5 % IJ SOLN
INTRAMUSCULAR | Status: DC | PRN
  Administered 2019-05-29: 7 mL

## 2019-05-29 MED ORDER — KETOROLAC TROMETHAMINE 30 MG/ML IJ SOLN
INTRAMUSCULAR | Status: DC | PRN
  Administered 2019-05-29: 30 mg via INTRAVENOUS

## 2019-05-29 MED ORDER — PHENYLEPHRINE 40 MCG/ML (10ML) SYRINGE FOR IV PUSH (FOR BLOOD PRESSURE SUPPORT)
PREFILLED_SYRINGE | INTRAVENOUS | Status: AC
  Filled 2019-05-29: qty 10

## 2019-05-29 MED ORDER — PROPOFOL 10 MG/ML IV BOLUS
INTRAVENOUS | Status: AC
  Filled 2019-05-29: qty 40

## 2019-05-29 MED ORDER — PROPOFOL 10 MG/ML IV BOLUS
INTRAVENOUS | Status: DC | PRN
  Administered 2019-05-29: 50 mg via INTRAVENOUS
  Administered 2019-05-29: 150 mg via INTRAVENOUS
  Administered 2019-05-29: 50 mg via INTRAVENOUS

## 2019-05-29 MED ORDER — CEFAZOLIN SODIUM-DEXTROSE 2-4 GM/100ML-% IV SOLN
2.0000 g | Freq: Once | INTRAVENOUS | Status: AC
  Administered 2019-05-29: 11:00:00 2 g via INTRAVENOUS

## 2019-05-29 MED ORDER — ONDANSETRON HCL 4 MG/2ML IJ SOLN
INTRAMUSCULAR | Status: AC
  Filled 2019-05-29: qty 2

## 2019-05-29 MED ORDER — MIDAZOLAM HCL 2 MG/2ML IJ SOLN
INTRAMUSCULAR | Status: AC
  Filled 2019-05-29: qty 2

## 2019-05-29 MED ORDER — ACETAMINOPHEN 500 MG PO TABS
ORAL_TABLET | ORAL | Status: AC
  Filled 2019-05-29: qty 2

## 2019-05-29 MED ORDER — GENTAMICIN SULFATE 40 MG/ML IJ SOLN: 1.5000 mg/kg | Freq: Once | INTRAVENOUS | Status: DC

## 2019-05-29 MED ORDER — ACETAMINOPHEN 500 MG PO TABS
1000.0000 mg | ORAL_TABLET | Freq: Once | ORAL | Status: AC
  Administered 2019-05-29: 1000 mg via ORAL

## 2019-05-29 MED ORDER — SODIUM CHLORIDE 0.9 % IR SOLN
Status: DC | PRN
  Administered 2019-05-29 (×3): 3000 mL

## 2019-05-29 MED ORDER — LACTATED RINGERS IV SOLN: INTRAVENOUS | Status: DC

## 2019-05-29 MED ORDER — MIDAZOLAM HCL 5 MG/5ML IJ SOLN
INTRAMUSCULAR | Status: DC | PRN
  Administered 2019-05-29: 2 mg via INTRAVENOUS

## 2019-05-29 MED ORDER — PHENYLEPHRINE HCL (PRESSORS) 10 MG/ML IV SOLN
INTRAVENOUS | Status: DC | PRN
  Administered 2019-05-29 (×2): 120 ug via INTRAVENOUS

## 2019-05-29 MED ORDER — PROMETHAZINE HCL 25 MG/ML IJ SOLN: 6.2500 mg | INTRAMUSCULAR | Status: DC | PRN

## 2019-05-29 MED ORDER — CEFAZOLIN SODIUM-DEXTROSE 2-4 GM/100ML-% IV SOLN
INTRAVENOUS | Status: AC
  Filled 2019-05-29: qty 100

## 2019-05-29 SURGICAL SUPPLY — 41 items
BAG DRAIN URO-CYSTO SKYTR STRL (DRAIN) ×4 IMPLANT
BAG URINE DRAIN 2000ML AR STRL (UROLOGICAL SUPPLIES) ×4 IMPLANT
BLADE SURG 15 STRL LF DISP TIS (BLADE) ×2 IMPLANT
BLADE SURG 15 STRL SS (BLADE) ×2
BNDG COHESIVE 2X5 TAN STRL LF (GAUZE/BANDAGES/DRESSINGS) ×4 IMPLANT
BNDG CONFORM 2 STRL LF (GAUZE/BANDAGES/DRESSINGS) ×4 IMPLANT
CATH FOLEY 3WAY 30CC 22F (CATHETERS) ×4 IMPLANT
COVER BACK TABLE 60X90IN (DRAPES) ×4 IMPLANT
COVER MAYO STAND STRL (DRAPES) ×4 IMPLANT
COVER WAND RF STERILE (DRAPES) ×4 IMPLANT
DRAPE LAPAROTOMY 100X72 PEDS (DRAPES) ×4 IMPLANT
ELECT NEEDLE TIP 2.8 STRL (NEEDLE) ×4 IMPLANT
ELECT REM PT RETURN 9FT ADLT (ELECTROSURGICAL) ×4
ELECTRODE REM PT RTRN 9FT ADLT (ELECTROSURGICAL) ×2 IMPLANT
GAUZE PETROLATUM 1 X8 (GAUZE/BANDAGES/DRESSINGS) ×4 IMPLANT
GLOVE BIO SURGEON STRL SZ7.5 (GLOVE) ×4 IMPLANT
GLOVE BIOGEL M STRL SZ7.5 (GLOVE) ×4 IMPLANT
GLOVE BIOGEL PI IND STRL 7.5 (GLOVE) ×2 IMPLANT
GLOVE BIOGEL PI INDICATOR 7.5 (GLOVE) ×2
GOWN STRL REUS W/ TWL XL LVL3 (GOWN DISPOSABLE) ×2 IMPLANT
GOWN STRL REUS W/TWL XL LVL3 (GOWN DISPOSABLE) ×6 IMPLANT
HOLDER FOLEY CATH W/STRAP (MISCELLANEOUS) ×4 IMPLANT
KIT TURNOVER CYSTO (KITS) ×4 IMPLANT
LOOP CUT BIPOLAR 24F LRG (ELECTROSURGICAL) ×4 IMPLANT
MANIFOLD NEPTUNE II (INSTRUMENTS) ×4 IMPLANT
NEEDLE HYPO 25X1 1.5 SAFETY (NEEDLE) ×4 IMPLANT
PENCIL BUTTON HOLSTER BLD 10FT (ELECTRODE) ×4 IMPLANT
PLUG CATH AND CAP STER (CATHETERS) ×8 IMPLANT
SET BASIN DAY SURGERY F.S. (CUSTOM PROCEDURE TRAY) ×4 IMPLANT
SUT CHROMIC 3 0 SH 27 (SUTURE) ×8 IMPLANT
SYR 30ML LL (SYRINGE) ×4 IMPLANT
SYR CONTROL 10ML LL (SYRINGE) ×4 IMPLANT
SYR TOOMEY IRRIG 70ML (MISCELLANEOUS) ×4
SYRINGE TOOMEY IRRIG 70ML (MISCELLANEOUS) ×2 IMPLANT
TOWEL OR 17X26 10 PK STRL BLUE (TOWEL DISPOSABLE) ×4 IMPLANT
TRAY CYSTO PACK (CUSTOM PROCEDURE TRAY) ×4 IMPLANT
TRAY DSU PREP LF (CUSTOM PROCEDURE TRAY) ×4 IMPLANT
TUBE CONNECTING 12'X1/4 (SUCTIONS) ×1
TUBE CONNECTING 12X1/4 (SUCTIONS) ×3 IMPLANT
TUBING UROLOGY SET (TUBING) ×4 IMPLANT
WATER STERILE IRR 500ML POUR (IV SOLUTION) ×4 IMPLANT

## 2019-05-29 NOTE — Op Note (Signed)
Operative Note  Preoperative diagnosis:  1.  Bladder outlet obstruction 2.  Gleason 8 prostate cancer 3.  Phimosis  Postoperative diagnosis: 1.  Bladder outlet obstruction 2.  Gleason 8 prostate cancer 3.  Phimosis  Procedure(s): 1.  Cystoscopy with bipolar TURP 2.  Circumcision  Surgeon: Ellison Hughs, MD  Assistants:  None  Anesthesia:  General  Complications:  None  EBL: 20 mL  Specimens: 1.  Prostate chips 2.  Foreskin  Drains/Catheters: 1.  22 French three-way Foley catheter with 30 mL in the balloon  Intraoperative findings:   1. Trilobar prostatic urethral obstruction 2. Phimosis of the penile foreskin  Indication:  Jonathan Austin is a 54 y.o. 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  He also has phimosis of the penile foreskin.  He is planning on XRT for definitive treatment of his high risk prostate cancer, but due to his significant lower urinary tract symptoms, cannot proceed without surgical resection of the prostate.  He has been consented for the above procedures, voices understanding and wishes to proceed.  Description of procedure:  After informed consent was obtained, the patient was brought to the operating room and general LMA anesthesia was administered. The patient was then placed in the dorsolithotomy position and prepped and draped in the usual sterile fashion. A timeout was performed. A 23 French rigid cystoscope was then inserted into the urethral meatus and advanced into the bladder under direct vision. A complete bladder survey revealed trilobar prostatic urethral obstruction, with no intravesical pathology.  The 23 French rigid cystoscope was then exchanged for a 26 [place] resectoscope with a bipolar loop working element.  His prominent median lobe was then resected, starting at the bladder neck and progressing to the  verumontanum.  I also took a small amount of prostatic tissue at the 5 and 7:00 positions, and creating a more patent prostatic urethral channel.  All prostate chips were then evacuated from the bladder through the resectoscope sheath.  The prostatic resection bed was then extensively fulgurated until hemostasis was achieved.  A 22 French three-way Foley catheter was then inserted and the balloon was inflated with 30 mL.  Circumferential marks were then made along the glandular impression and  the second being with the foreskin retracted approximately 3 cm from the coronal sulcus.  The marks were then incised using electrocautery and the excess foreskin was excised.  The penile shaft skin was then reapproximated using interrupted 3-0 chromic suture.  A penile ring block was then performed using quarter percent Marcaine without epinephrine.  The penis was then dressed in the usual fashion.  The patient tolerated the procedure well and was transferred to the postanesthesia unit in stable condition.  Plan:  CBI in PACU and discharge home if his urine is clear to light pink   Cc: Tyler Pita, MD

## 2019-05-29 NOTE — Anesthesia Preprocedure Evaluation (Addendum)
Anesthesia Evaluation  Patient identified by MRN, date of birth, ID band Patient awake    Reviewed: Allergy & Precautions, NPO status , Patient's Chart, lab work & pertinent test results  Airway Mallampati: III  TM Distance: >3 FB Neck ROM: Full    Dental no notable dental hx. (+) Poor Dentition, Chipped, Missing, Dental Advisory Given,    Pulmonary sleep apnea and Continuous Positive Airway Pressure Ventilation ,    Pulmonary exam normal breath sounds clear to auscultation       Cardiovascular hypertension, Pt. on medications Normal cardiovascular exam Rhythm:Regular Rate:Normal  HLD  Echo 2016- nml, EF 55-60%   Neuro/Psych PSYCHIATRIC DISORDERS Depression negative neurological ROS     GI/Hepatic negative GI ROS, (+)     substance abuse  alcohol use, cocaine use and marijuana use, States last crack cocaine use 05/03/19, UDS in march only positive for cocaine. Will test urine again today  Hx alcohol abuse- has been taking naltrexone daily    Endo/Other  diabetes, Poorly Controlled, Type 2, Insulin Dependent, Oral Hypoglycemic AgentsObesity BMI 35  Very poorly controlled T2DM- last A1c 11.4, but fasting glucose this AM 103  Renal/GU negative Renal ROS   Prostate ca, phimosis  negative genitourinary   Musculoskeletal negative musculoskeletal ROS (+)   Abdominal (+) + obese,   Peds negative pediatric ROS (+)  Hematology negative hematology ROS (+)   Anesthesia Other Findings   Reproductive/Obstetrics negative OB ROS                           Anesthesia Physical Anesthesia Plan  ASA: III  Anesthesia Plan: General   Post-op Pain Management:    Induction: Intravenous  PONV Risk Score and Plan: 3 and Ondansetron, Dexamethasone, Midazolam and Treatment may vary due to age or medical condition  Airway Management Planned: LMA  Additional Equipment: None  Intra-op Plan:    Post-operative Plan: Extubation in OR  Informed Consent: I have reviewed the patients History and Physical, chart, labs and discussed the procedure including the risks, benefits and alternatives for the proposed anesthesia with the patient or authorized representative who has indicated his/her understanding and acceptance.     Dental advisory given  Plan Discussed with: CRNA  Anesthesia Plan Comments: (UDS this morning still positive; patient states last use of crack cocaine multiple weeks ago. Does not appear to be acutely intoxicated with anything. Counseled pt on increased risk that comes with cocaine use and hemodynamic instability under GA; pt wishes to proceed. )       Anesthesia Quick Evaluation

## 2019-05-29 NOTE — Progress Notes (Signed)
Patient received a 31-month Eligard injection with Dr. Lovena Neighbours on 05/23/2019 and is scheduled to proceed with TURP with Dr. Lovena Neighbours today, 05/29/2019 to address his significant LUTS prior to proceeding with daily radiotherapy.  Dr. Lovena Neighbours is also planning to get the patient back in with Dr. Alen Blew in the near future to start additional systemic therapy for his advanced prostate cancer.  We will plan to proceed with CT simulation/treatment planning in June 2021 in anticipation of beginning his daily radiotherapy shortly thereafter pending there are no complications or delays in his recovery from his upcoming TURP procedure.  Nicholos Johns, MMS, PA-C [facility]: [phone]  Fax: [phone]

## 2019-05-29 NOTE — H&P (Signed)
PRE-OP H&P  Office Visit Report     05/23/2019   Jonathan Austin  MRN: [id number]  DOB: August 21, 1965, 54 year old Male   PROVIDER:  Ellison Hughs, M.D.  LOCATION:  [facility] [phone]   CC/HPI: CC: prostate cancer   HPI: Mr. Jonathan Austin is a 54 year old male with a history of high volume cT3a, N1. Mx, Gleason 8 prostate cancer.   Last PSA: 317.00 (03/2019), 266 (12/2018)  Biopsy Date: 03/25/19  TNM stage: T1c  Gleason score: 4+4=8  Left: 6 out of 6 cores positive for a combination of high volume Gleason 4+3=7 and 8  Right: 6 out of 6 cores positive for a combination of high volume Gleason 4+3=7 and 8  Prostate volume: 71 cm^3   Imaging studies:  Bone scan (04/12/19): Negative for metastatic disease.  CT abd/pelvis (04/12/19): 13 mm presacral lymph node and 9 mm suspicious left common iliac lymph node, no other evidence of metastatic disease   05/23/19: Mr. [name] is here today to start ADT via Eligard and to discuss proceeding with a TURP prior to starting XRT for treatment of his prostate cancer. He continues to struggle with bothersome urgency, frequency and weak force of stream, despite tamsulosin. He denies interval urinary tract infections, dysuria or hematuria. He is also having more difficulty retracting his foreskin.     ALLERGIES: No Allergies    MEDICATIONS: Hydrocodone-Acetaminophen 5 mg-325 mg tablet 1 tablet PO As Directed Take one hour prior to your scheduled prostate biopsy  Tamsulosin Hcl 0.4 mg capsule 1 capsule PO Daily  Acetaminophen 500 mg tablet Oral  Doxycycline Hyclate 100 mg capsule Oral  Insulin  Lisinopril 10 mg tablet Oral  Metformin Hcl 500 mg tablet Oral  Multivitamins tablet Oral     GU PSH: Drain Scrotal Wall Abscess - 2015 Prostate Needle Biopsy - 03/25/2019       PSH Notes: Surgery Scrotum Drainage Of Scrotal Wall Abscess Left, Foot Surgery, Tonsillectomy   NON-GU PSH: Remove Tonsils - 2015 Surgical Pathology, Gross And  Microscopic Examination For Prostate Needle - 03/25/2019     GU PMH: Prostate Cancer - 04/01/2019 BPH w/LUTS - 03/14/2019 Elevated PSA - 03/14/2019 Encounter for Prostate Cancer screening - 03/14/2019 Urinary Urgency - 03/14/2019 Weak Urinary Stream - 03/14/2019 Scrotal abscess/inflammation, Abscess of scrotum - 2015    NON-GU PMH: Encounter for general adult medical examination without abnormal findings, Encounter for preventive health examination - 2015 Personal history of other diseases of the circulatory system, History of hypertension - 2015 Personal history of other diseases of the nervous system and sense organs, History of sleep apnea - 2015 Personal history of other endocrine, nutritional and metabolic disease, History of diabetes mellitus - 2015 Anxiety Diabetes Type 2 Hypercholesterolemia Hypertension    FAMILY HISTORY: Diabetes - Runs In Family Hypertension - Runs In Family   SOCIAL HISTORY: Marital Status: Married Preferred Language: English; Race: Black or African American Current Smoking Status: Patient smokes. Smokes 1/2 pack per day.   Tobacco Use Assessment Completed: Used Tobacco in last 30 days? Drinks 12 drinks per week. Types of alcohol consumed: Beer.  Drinks 3 caffeinated drinks per day.     Notes: Alcohol use, Never a smoker, Mother alive and healthy, Father alive and healthy, Married, Occupation, Number of children, Caffeine use   REVIEW OF SYSTEMS:    GU Review Male:   Patient denies frequent urination, hard to postpone urination, burning/ pain with urination, get up at night to urinate,  leakage of urine, stream starts and stops, trouble starting your stream, have to strain to urinate , erection problems, and penile pain.  Gastrointestinal (Upper):   Patient denies nausea, vomiting, and indigestion/ heartburn.  Gastrointestinal (Lower):   Patient denies diarrhea and constipation.  Constitutional:   Patient denies fever, night sweats, weight loss, and fatigue.   Skin:   Patient denies skin rash/ lesion and itching.  Eyes:   Patient denies blurred vision and double vision.  Ears/ Nose/ Throat:   Patient denies sore throat and sinus problems.  Hematologic/Lymphatic:   Patient denies swollen glands and easy bruising.  Cardiovascular:   Patient denies leg swelling and chest pains.  Respiratory:   Patient denies cough and shortness of breath.  Endocrine:   Patient denies excessive thirst.  Musculoskeletal:   Patient denies back pain and joint pain.  Neurological:   Patient denies headaches and dizziness.  Psychologic:   Patient denies anxiety and depression.   VITAL SIGNS:      05/23/2019 12:22 PM  Weight 225 lb / 102.06 kg  Height 67 in / 170.18 cm  BP 145/88 mmHg  Heart Rate 96 /min  Temperature 97.7 F / 36.5 C  BMI 35.2 kg/m   MULTI-SYSTEM PHYSICAL EXAMINATION:    Constitutional: Well-nourished. No physical deformities. Normally developed. Good grooming.  Neurologic / Psychiatric: Oriented to time, oriented to place, oriented to person. No depression, no anxiety, no agitation.  Musculoskeletal: Normal gait and station of head and neck.     Complexity of Data:  Lab Test Review:   PSA  Records Review:   Previous Patient Records   03/15/19  PSA  Total PSA 317.00 ng/mL    PROCEDURES:          Urinalysis Dipstick Dipstick Cont'd  Color: Yellow Bilirubin: Neg mg/dL  Appearance: Clear Ketones: Neg mg/dL  Specific Gravity: 1.015 Blood: Neg ery/uL  pH: 6.0 Protein: Neg mg/dL  Glucose: 3+ mg/dL Urobilinogen: 0.2 mg/dL    Nitrites: Neg    Leukocyte Esterase: Neg leu/uL         Eligard 45mg / 6 Month - [id number], [id number] The arm/ abdomen/ hip/ thigh was sterilely prepped with alcohol. Eligard was injected subcutaneously ([place]) using standard technique. The patient tolerated the procedure well. A band aid was applied (not sure if this is necessary for a [place] injection). The site was dry when the patient left the exam room. The patient will return as  scheduled.  After treatment was administered, patient was observed without any adverse reactions noted.    Qty: 45 Adm. By: Carlisle Beers  Unit: mg Lot No [id number]  Route: SQ Exp. Date 12/30/2019  Freq: None Mfgr.:   Site: RUQ   ASSESSMENT:      ICD-10 Details  1 GU:   Prostate Cancer - C61 Chronic, Stable  2   BPH w/LUTS - N40.1 Chronic, Stable  3   Urinary Urgency - R39.15   4   Phimosis - N47.1 Chronic, Stable   PLAN:           Orders Labs PSA          Schedule Return Visit/Planned Activity: ASAP - Schedule Surgery          Document Letter(s):  Created for Zola Button, MD   Created for Patient: Clinical Summary   Created for Rodman Key A. Tammi Klippel, MD         Notes:   -6 months Eligard injection administered today. I explained that ADT has a variety  of adverse effects including hot flashes, loss of libido and erectile dysfunction, shrinkage of the penis and testicles, loss of muscle mass and strength, fatigue, depression, hair loss, osteoporosis, greater incidence of clinical fractures, obesity, insulin resistance, alterations in lipid profile and greater risk for diabetes and cardiovascular disease.   -The risks, benefits and alternatives of cystoscopy with TURP was discussed with the patient. The risks included, but are not limited to, bleeding, urinary tract infection, bladder perforation requiring prolonged catheterization and/or open bladder repair, ureteral injury, ureteral obstruction, urethral stricture disease, new or worsening voiding dysfunction, retrograde ejaculation, MI, CVA, PE, DVT and the inherent risks of general anesthesia. We also discussed the need for Foley catheterization for at least 3 days post-op and the likely need for post-op observation in the hospital following the procedure. The patient voices understanding and wishes to proceed.   -The risks, benefits and alternatives of adult circumcision was discussed with the patient. The risks included, but are  not limited to, bleeding, infection, pain, MI, CVA, DVT, PE and the inherent risks of general anesthesia.   -I will arrange follow up with Dr. Alen Blew so he can initiate additional systemic therapy for his advanced prostate cancer.

## 2019-05-29 NOTE — Progress Notes (Signed)
Dr. Doroteo Glassman aware of urine drug screen positive for cocaine.

## 2019-05-29 NOTE — Anesthesia Procedure Notes (Signed)
Procedure Name: LMA Insertion Date/Time: 05/29/2019 10:56 AM Performed by: Justice Rocher, CRNA Pre-anesthesia Checklist: Patient identified, Emergency Drugs available, Suction available, Patient being monitored and Timeout performed Patient Re-evaluated:Patient Re-evaluated prior to induction Oxygen Delivery Method: Circle system utilized Preoxygenation: Pre-oxygenation with 100% oxygen Induction Type: IV induction Ventilation: Mask ventilation without difficulty LMA: LMA inserted LMA Size: 5.0 Number of attempts: 1 Airway Equipment and Method: Bite block Placement Confirmation: positive ETCO2,  CO2 detector and breath sounds checked- equal and bilateral Tube secured with: Tape Dental Injury: Teeth and Oropharynx as per pre-operative assessment  Comments: Teeth poor condition chipped, gapping, poor habits

## 2019-05-29 NOTE — Discharge Instructions (Signed)

## 2019-05-29 NOTE — Transfer of Care (Signed)
Immediate Anesthesia Transfer of Care Note  Patient: Jonathan Austin  Procedure(s) Performed: Procedure(s) (LRB): TRANSURETHRAL RESECTION OF THE PROSTATE (TURP)/ CYSTOSCOPY (N/A) CIRCUMCISION ADULT (N/A)  Patient Location: PACU  Anesthesia Type: General  Level of Consciousness: awake, sedated, patient cooperative and responds to stimulation  Airway & Oxygen Therapy: Patient Spontanous Breathing and Patient connected to [place] 02 and soft FM   Post-op Assessment: Report given to PACU RN, Post -op Vital signs reviewed and stable and Patient moving all extremities  Post vital signs: Reviewed and stable  Complications: No apparent anesthesia complications

## 2019-05-29 NOTE — Anesthesia Postprocedure Evaluation (Signed)
Anesthesia Post Note  Patient: Jonathan Austin  Procedure(s) Performed: TRANSURETHRAL RESECTION OF THE PROSTATE (TURP)/ CYSTOSCOPY (N/A Prostate) CIRCUMCISION ADULT (N/A Penis)     Patient location during evaluation: PACU Anesthesia Type: General Level of consciousness: awake and alert, oriented and patient cooperative Pain management: pain level controlled Vital Signs Assessment: post-procedure vital signs reviewed and stable Respiratory status: spontaneous breathing, nonlabored ventilation and respiratory function stable Cardiovascular status: blood pressure returned to baseline and stable Postop Assessment: no apparent nausea or vomiting Anesthetic complications: no    Last Vitals:  Vitals:   05/29/19 0901 05/29/19 1209  BP: (!) 139/97 (!) 157/105  Pulse: 98 (!) 102  Resp: 18 14  Temp: 36.7 C (!) 36.4 C  SpO2: 99% 96%    Last Pain:  Vitals:   05/29/19 1209  PainSc: 0-No pain                 Jarome Matin Vincenzina Jagoda

## 2019-05-29 NOTE — BH Assessment (Signed)
Q-Actual   Writer attempted to contact the participant in order to complete a PHQ 2-9.  Writer was unsuccessful in reaching the patient and was able to leave a voice mail message.    Patient not completed any Wellness Check.

## 2019-05-30 LAB — SURGICAL PATHOLOGY

## 2019-06-02 ENCOUNTER — Emergency Department (HOSPITAL_COMMUNITY)
Admission: EM | Admit: 2019-06-02 | Discharge: 2019-06-03 | Disposition: A | Discharge status: Home / Self Care | Payer: 59 | Attending: Emergency Medicine | Admitting: Emergency Medicine

## 2019-06-02 ENCOUNTER — Other Ambulatory Visit: Payer: Self-pay

## 2019-06-02 DIAGNOSIS — Y69 Unspecified misadventure during surgical and medical care: Secondary | ICD-10-CM | POA: Diagnosis not present

## 2019-06-02 DIAGNOSIS — I10 Essential (primary) hypertension: Secondary | ICD-10-CM | POA: Insufficient documentation

## 2019-06-02 DIAGNOSIS — Z9889 Other specified postprocedural states: Secondary | ICD-10-CM | POA: Diagnosis not present

## 2019-06-02 DIAGNOSIS — K5900 Constipation, unspecified: Secondary | ICD-10-CM | POA: Diagnosis present

## 2019-06-02 DIAGNOSIS — E119 Type 2 diabetes mellitus without complications: Secondary | ICD-10-CM | POA: Insufficient documentation

## 2019-06-02 DIAGNOSIS — T83031A Leakage of indwelling urethral catheter, initial encounter: Secondary | ICD-10-CM | POA: Diagnosis not present

## 2019-06-02 DIAGNOSIS — K5903 Drug induced constipation: Secondary | ICD-10-CM | POA: Diagnosis not present

## 2019-06-02 DIAGNOSIS — C61 Malignant neoplasm of prostate: Secondary | ICD-10-CM | POA: Insufficient documentation

## 2019-06-02 DIAGNOSIS — Z794 Long term (current) use of insulin: Secondary | ICD-10-CM | POA: Diagnosis not present

## 2019-06-02 MED ORDER — FLEET ENEMA 7-19 GM/118ML RE ENEM
1.0000 | ENEMA | Freq: Once | RECTAL | Status: AC
  Administered 2019-06-02: 1 via RECTAL
  Filled 2019-06-02: qty 1

## 2019-06-02 NOTE — ED Provider Notes (Signed)
[facility] DEPT Provider Note   CSN: [id number] Arrival date & time: 06/02/19  2132  History Chief Complaint  Patient presents with  . Constipation     Jonathan Austin is a 54 y.o. male with history of prostate cancer s/p TURP and circumcision by Dr. Gilford Rile on 4/21 and polysubstance abuse who presents with constipation and a catheter problem. Pt has been having problems with constipation since being on narcotics after his surgery. Every time he strains to have a BM he will have more pain around his penis and urine and blood will leak out around the catheter. When he is not straining the catheter seems to be draining normally. He denies any fever. He has had some hematuria since the surgery which seems to be stable. He is not passing any clots. He has an appointment with urology tomorrow to have the catheter taken out.   HPI     Past Medical History:  Diagnosis Date  . Alcohol use disorder, severe, dependence ([place])    05-24-2019 recent [facility] admission for suicide ideation and alcohol / cocaine detox 05-04-2019 in epic,  pt stated has been taking naltrexone daily as prescribed since and no alcohol since  . Cocaine abuse ([place])    05-24-2019  pt had recent [facility] admission, pt stated last used  "crack" 05-03-2019  . Cocaine abuse with cocaine-induced mood disorder ([place])   . Depression   . History of suicidal ideation    multiple [facility] admission's;  last one admitted 05-04-2019  . Hyperplasia of prostate with lower urinary tract symptoms (LUTS)   . Hypertension    followed by pcp  . OSA on CPAP    study 10-02-2012 in epic  . Phimosis   . Prostate cancer [facility]) urologist-- dr winter   dx 02/ 2021;  localized advanced, gleason 4+4, PSA 317  . Type 2 diabetes mellitus treated with insulin ([place])    followed by pcp   (05-24-2019  per stated checks blood sugar twice daily, fasting sugar-- 180-200)  . Wears glasses     Patient Active Problem List   Diagnosis Date  Noted  . MDD (major depressive disorder) 05/04/2019  . Malignant neoplasm of prostate ([place]) 04/30/2019  . MDD (major depressive disorder), recurrent episode, severe ([place]) 01/24/2018  . Polysubstance dependence including opioid drug with daily use ([place]) 03/16/2017  . MDD (major depressive disorder), severe ([place]) 03/15/2017  . Cocaine use disorder, severe, dependence ([place]) 07/19/2016  . Major depressive disorder, recurrent episode ([place]) 07/16/2016  . Alcohol use disorder, severe, dependence ([place]) 07/16/2016  . Thoracic aortic atherosclerosis ([place]) 05/09/2016  . Influenza B 05/09/2016  . Dehydration   . Lactic acidosis   . Severe recurrent major depression without psychotic features ([place]) 02/21/2016  . Renal insufficiency 01/20/2016  . HTN (hypertension) 09/20/2015  . HLD (hyperlipidemia) 09/20/2015  . Substance abuse ([place]) 08/30/2014  . Substance induced mood disorder ([place]) 05/07/2014  . Cocaine abuse with cocaine-induced mood disorder ([place])   . Alcohol abuse   . Sinus tachycardia 02/12/2014  . Cellulitis 06/28/2013  . Uncontrolled diabetes mellitus with hyperglycemia ([place]) 06/28/2013  . Marijuana abuse 06/28/2013  . Diabetes mellitus type II, uncontrolled ([place]) 12/09/2012    Past Surgical History:  Procedure Laterality Date  . CIRCUMCISION N/A 05/29/2019   Procedure: CIRCUMCISION ADULT;  Surgeon: Ceasar Mons, MD;  Location: [name];  Service: Urology;  Laterality: N/A;  . PROSTATE BIOPSY    . TONSILLECTOMY  child  . TRANSURETHRAL RESECTION  OF PROSTATE N/A 05/29/2019   Procedure: TRANSURETHRAL RESECTION OF THE PROSTATE (TURP)/ CYSTOSCOPY;  Surgeon: Ceasar Mons, MD;  Location: [facility];  Service: Urology;  Laterality: N/A;       Family History  Problem Relation Age of Onset  . Hypertension Mother   . Hypertension Father   . Diabetes Father   . Prostate cancer Maternal Grandfather 96  . Breast cancer Neg Hx   .  Colon cancer Neg Hx   . Pancreatic cancer Neg Hx     Social History   Tobacco Use  . Smoking status: Never Smoker  . Smokeless tobacco: Never Used  Substance Use Topics  . Alcohol use: Not Currently    Comment: drinks beer 40 oz x 2 daily  (05-24-2019 per pt no alcohol since 05-03-2019)  . Drug use: Yes    Types: Cocaine, Marijuana    Comment: smokes 2g crack daily  (05-24-2019 per pt last crack and marijuana 05-03-2019)    Home Medications Prior to Admission medications   Medication Sig Start Date End Date Taking? Authorizing Provider  amLODipine (NORVASC) 10 MG tablet Take 1 tablet (10 mg total) by mouth daily. For high blood pressure 01/27/18   Lindell Spar I, NP  aspirin 81 MG EC tablet Take 1 tablet (81 mg total) by mouth daily. Swallow whole. 05/08/19   Connye Burkitt, NP  cephALEXin (KEFLEX) 500 MG capsule Take 1 capsule (500 mg total) by mouth 2 (two) times daily for 5 days. 05/29/19 06/03/19  Ceasar Mons, MD  cholecalciferol (VITAMIN D3) 10 MCG (400 UNIT) TABS tablet Take 1 tablet (400 Units total) by mouth daily. 05/08/19   Connye Burkitt, NP  glipiZIDE (GLUCOTROL) 5 MG tablet Take 1 tablet (5 mg total) by mouth daily before breakfast. 05/08/19   Connye Burkitt, NP  HYDROcodone-acetaminophen (NORCO) 5-325 MG tablet Take 1 tablet by mouth every 4 (four) hours as needed for moderate pain. 05/29/19   Ceasar Mons, MD  Insulin Aspart Prot & Aspart (NOVOLOG MIX 70/30 [place]) Inject into the skin 2 (two) times daily. Per pt ,  70 units in AM;  12 units in PM    [provider]  LIPITOR 10 MG tablet Take 10 mg by mouth daily. 02/28/19   [provider]  metFORMIN (GLUCOPHAGE) 850 MG tablet Take 1 tablet (850 mg total) by mouth 2 (two) times daily with a meal. 05/07/19   Connye Burkitt, NP  naltrexone (DEPADE) 50 MG tablet Take 1 tablet (50 mg total) by mouth daily. 05/08/19   Connye Burkitt, NP  phenazopyridine (PYRIDIUM) 200 MG tablet Take 1 tablet  (200 mg total) by mouth 3 (three) times daily as needed (for pain with urination). 05/29/19 05/28/20  Ceasar Mons, MD  tamsulosin (FLOMAX) 0.4 MG CAPS capsule Take 1 capsule (0.4 mg total) by mouth daily after supper. Patient taking differently: Take 0.4 mg by mouth daily.  05/07/19   Connye Burkitt, NP    Allergies    Patient has no known allergies.  Review of Systems   Review of Systems  Constitutional: Negative for chills and fever.  Gastrointestinal: Positive for constipation. Negative for abdominal pain, nausea and vomiting.  Genitourinary: Positive for difficulty urinating, hematuria and penile swelling. Negative for flank pain.  All other systems reviewed and are negative.   Physical Exam Updated Vital Signs BP (!) 158/100 (BP Location: Left Arm)   Pulse (!) 103   Temp (!) 97.5  F (36.4 C) (Oral)   Resp 18   Ht 5\' 7"  (1.702 m)   Wt 99.8 kg   SpO2 97%   BMI 34.46 kg/m   Physical Exam Vitals and nursing note reviewed.  Constitutional:      General: He is not in acute distress.    Appearance: Normal appearance. He is well-developed. He is not ill-appearing.  HENT:     Head: Normocephalic and atraumatic.  Eyes:     General: No scleral icterus.       Right eye: No discharge.        Left eye: No discharge.     Conjunctiva/sclera: Conjunctivae normal.     Pupils: Pupils are equal, round, and reactive to light.  Cardiovascular:     Rate and Rhythm: Regular rhythm. Tachycardia present.  Pulmonary:     Effort: Pulmonary effort is normal. No respiratory distress.     Breath sounds: Normal breath sounds.  Abdominal:     General: There is no distension.     Palpations: Abdomen is soft.     Tenderness: There is no abdominal tenderness.  Genitourinary:    Comments: Significant swelling of the head of the penis. Catheter is in place and stitches are in place. Musculoskeletal:     Cervical back: Normal range of motion.  Skin:    General: Skin is warm and dry.    Neurological:     Mental Status: He is alert and oriented to person, place, and time.  Psychiatric:        Behavior: Behavior normal.     ED Results / Procedures / Treatments   Labs (all labs ordered are listed, but only abnormal results are displayed) Labs Reviewed - No data to display  EKG None  Radiology No results found.  Procedures Procedures (including critical care time)  Medications Ordered in ED Medications  sodium phosphate (FLEET) 7-19 GM/118ML enema 1 enema (1 enema Rectal Given 06/02/19 2317)    ED Course  I have reviewed the triage vital signs and the nursing notes.  Pertinent labs & imaging results that were available during my care of the patient were reviewed by me and considered in my medical decision making (see chart for details).  54 year old male presents with constipation and leakage of urine around the catheter when he strains to have a bowel movement.  Patient reports constipation started after being started on pain medicines postoperatively.  He is overall well-appearing but uncomfortable from postop pain.  He is mildly tachycardic states he feels dehydrated.  Blood pressure is minimally elevated.  Abdomen is soft and nontender.  The catheter appears to be draining appropriately.  Penis is mildly swollen and tender.  Stitches are in place.  Patient states that catheter is draining normally as long as he is not straining to have a bowel movement.  Digital rectal exam was performed and he is not impacted.  We will treat his constipation here with an enema. Low suspicion for infectious process.  Patient successfully had a bowel movement here.  He has an appointment in the morning to have the catheter removed in for a postop visit which she is encouraged to keep.  He was prescribed MiraLAX to take to help with problems with constipation.  MDM Rules/Calculators/A&P                       Final Clinical Impression(s) / ED Diagnoses Final diagnoses:   Drug-induced constipation    Rx / DC  Orders ED Discharge Orders    None       Iris Pert 06/03/19 0410    Drenda Freeze, MD 06/03/19 1630

## 2019-06-02 NOTE — ED Triage Notes (Signed)
PER EMS: Pt is coming from home with c/o catheter issues. Pt had surgery 3 days ago due to his prostate cancer and was also circumcised during this surgery. Pt had a catheter placed during this procedure. States his catheter has been "backing up" since the surgery. Tint of red in urine. Pain 6/10. Hx diabetes.  EMS VITALS: BP 132/78 HR 97 SPO2 98% RA CBG 309

## 2019-06-02 NOTE — ED Notes (Signed)
Patient ambulated to restroom with assistance.

## 2019-06-03 MED ORDER — POLYETHYLENE GLYCOL 3350 17 GM/SCOOP PO POWD: 1.0000 | Freq: Once | ORAL | 0 refills | Status: AC

## 2019-06-03 NOTE — Discharge Instructions (Signed)
Please start Miralax for constipation Starting tomorrow, take 6 capfuls of MiraLAX in a 32 oz bottle of Gatorade over 2-4 hour period.  On day 2, take 3 capfuls, three times a day.  On day 3, take 1 capful 3 times a day. Slowly cut back as needed until you have normal bowel movements.  Follow up with Urology tomorrow morning

## 2019-06-08 ENCOUNTER — Emergency Department (HOSPITAL_COMMUNITY)
Admission: EM | Admit: 2019-06-08 | Discharge: 2019-06-08 | Disposition: A | Discharge status: Home / Self Care | Payer: 59 | Attending: Emergency Medicine | Admitting: Emergency Medicine

## 2019-06-08 ENCOUNTER — Other Ambulatory Visit: Payer: Self-pay

## 2019-06-08 ENCOUNTER — Encounter (HOSPITAL_COMMUNITY): Payer: Self-pay | Admitting: Emergency Medicine

## 2019-06-08 DIAGNOSIS — N4889 Other specified disorders of penis: Secondary | ICD-10-CM | POA: Diagnosis present

## 2019-06-08 DIAGNOSIS — Z5321 Procedure and treatment not carried out due to patient leaving prior to being seen by health care provider: Secondary | ICD-10-CM | POA: Insufficient documentation

## 2019-06-08 NOTE — ED Triage Notes (Signed)
Pt reports that he had prostate surgery and circumcision on 4/21. Reports today while in shower had stitches to come out of circumcison and having lots of pain.

## 2019-06-17 ENCOUNTER — Encounter (HOSPITAL_COMMUNITY): Payer: Self-pay

## 2019-06-17 DIAGNOSIS — F320 Major depressive disorder, single episode, mild: Secondary | ICD-10-CM

## 2019-06-17 NOTE — BH Assessment (Signed)
Q-Actual   Writer attempted to contact the participant in order to complete a PHQ 2-9.  Writer was unsuccessful in reaching the patient and left a voice mail message.   Participant has not been completing his daily [employer].

## 2019-07-09 ENCOUNTER — Encounter (HOSPITAL_COMMUNITY): Payer: Self-pay

## 2019-07-09 DIAGNOSIS — F320 Major depressive disorder, single episode, mild: Secondary | ICD-10-CM

## 2019-07-09 NOTE — BH Assessment (Signed)
.  Q-actual 2-wk PHQ 2-9   PHQ 2 is 0  PHQ 9 is 3

## 2019-07-11 ENCOUNTER — Encounter: Payer: Self-pay | Admitting: Medical Oncology

## 2019-07-11 NOTE — Progress Notes (Signed)
Spoke with patient to follow up. He is post TURP and circumcision 4/21. He states he is doing well and has recovered well. He states he attempted to call Dr. Jackson Latino office earlier this week to see what he plan is.  Per Dr. Jackson Latino last note, he was going to get patient in with Dr. Alen Blew to discuss systemic therapy, but he has not been contacted with an appointment. He will need fiducial markers and SpaceOar gel before radiation. I discussed the purpose of these. I will follow up with Ashlyn, PA and call him back with an update. He voiced understanding.

## 2019-07-12 ENCOUNTER — Encounter: Payer: Self-pay | Admitting: Medical Oncology

## 2019-07-12 NOTE — Progress Notes (Signed)
Spoke with Mickel Baas- Dr. Jackson Latino office to follow up on referral to Dr. Alen Blew to discuss systemic treatment and moving forward with radiation. She will ask Dr. Lovena Neighbours and get back with me.

## 2019-07-15 ENCOUNTER — Encounter: Payer: Self-pay | Admitting: Medical Oncology

## 2019-07-15 NOTE — Progress Notes (Signed)
Received voicemail from [place]- Dr.Winter's office, patient needs follow up with Dr. Alen Blew to discuss systemic therapy. Message forwarded to Dr. Alen Blew.

## 2019-07-16 ENCOUNTER — Telehealth: Payer: Self-pay | Admitting: Oncology

## 2019-07-16 NOTE — Telephone Encounter (Signed)
Scheduled appt per 6/8 sch message - pt is aware of appt date and time   

## 2019-07-23 ENCOUNTER — Inpatient Hospital Stay: Payer: 59 | Admitting: Oncology

## 2019-07-23 ENCOUNTER — Telehealth: Payer: Self-pay

## 2019-07-23 NOTE — Telephone Encounter (Signed)
Patient was a no show for his 12:00 appointment with Dr. Alen Blew today. Called patient and he stated he called [facility] this morning to reschedule his appointments. Patient stated he thought his appointment was at [facility]. Patient does want to reschedule. Scheduling message sent.

## 2019-07-24 ENCOUNTER — Telehealth: Payer: Self-pay | Admitting: Oncology

## 2019-07-24 ENCOUNTER — Other Ambulatory Visit: Payer: Self-pay | Admitting: Urology

## 2019-07-24 DIAGNOSIS — C61 Malignant neoplasm of prostate: Secondary | ICD-10-CM

## 2019-07-24 NOTE — Telephone Encounter (Signed)
Called pt per 6/15 sch message - no answer . Left message for patient to call back and reschedule appt.

## 2019-08-06 ENCOUNTER — Telehealth: Payer: Self-pay | Admitting: Pharmacy Technician

## 2019-08-06 ENCOUNTER — Telehealth: Payer: Self-pay | Admitting: Pharmacist

## 2019-08-06 ENCOUNTER — Telehealth: Payer: Self-pay | Admitting: Oncology

## 2019-08-06 ENCOUNTER — Inpatient Hospital Stay: Payer: 59 | Attending: Oncology | Admitting: Oncology

## 2019-08-06 ENCOUNTER — Other Ambulatory Visit: Payer: Self-pay

## 2019-08-06 VITALS — BP 124/90 | HR 100 | Temp 97.6°F | Resp 18 | Wt 211.0 lb

## 2019-08-06 DIAGNOSIS — E119 Type 2 diabetes mellitus without complications: Secondary | ICD-10-CM | POA: Insufficient documentation

## 2019-08-06 DIAGNOSIS — C61 Malignant neoplasm of prostate: Secondary | ICD-10-CM | POA: Insufficient documentation

## 2019-08-06 DIAGNOSIS — R399 Unspecified symptoms and signs involving the genitourinary system: Secondary | ICD-10-CM | POA: Insufficient documentation

## 2019-08-06 DIAGNOSIS — E291 Testicular hypofunction: Secondary | ICD-10-CM | POA: Insufficient documentation

## 2019-08-06 MED ORDER — ABIRATERONE ACETATE 250 MG PO TABS: 1000.0000 mg | ORAL_TABLET | Freq: Every day | ORAL | 0 refills | Status: DC

## 2019-08-06 NOTE — Telephone Encounter (Signed)
Error

## 2019-08-06 NOTE — Progress Notes (Signed)
Hematology and Oncology Follow Up Visit  Jonathan Austin [id number] [birth year] 54 y.o. 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  He was found to have Gleason score 4+4 = 8, PSA 266 and small pelvic lymph node involvement.   Prior Therapy:  He is status post prostate biopsy completed in February 2021.  He is status post cystoscopy and TURP procedure by Dr. Lovena Neighbours in April 2021.  Current therapy:  Eligard androgen deprivation therapy under the care of Dr. Lovena Neighbours.   Interim History: Jonathan Austin presents today for a follow-up visit.  Since the last visit, he reports no major changes in his health.  He underwent a TURP procedure and has tolerated well.  He is urinating better at this time without any complications.  Denies any nausea, vomiting or abdominal pain.  He denies any bone pain or pathological fractures.    Medications: I have reviewed the patient's current medications.  Current Outpatient Medications  Medication Sig Dispense Refill  . amLODipine (NORVASC) 10 MG tablet Take 1 tablet (10 mg total) by mouth daily. For high blood pressure 30 tablet 0  . aspirin 81 MG EC tablet Take 1 tablet (81 mg total) by mouth daily. Swallow whole. 30 tablet 0  . cholecalciferol (VITAMIN D3) 10 MCG (400 UNIT) TABS tablet Take 1 tablet (400 Units total) by mouth daily. 30 tablet 0  . glipiZIDE (GLUCOTROL) 5 MG tablet Take 1 tablet (5 mg total) by mouth daily before breakfast. 30 tablet 0  . HYDROcodone-acetaminophen (NORCO) 5-325 MG tablet Take 1 tablet by mouth every 4 (four) hours as needed for moderate pain. 20 tablet 0  . Insulin Aspart Prot & Aspart (NOVOLOG MIX 70/30 [place]) Inject into the skin 2 (two) times daily. Per pt ,  70 units in AM;  12 units in PM    . LIPITOR 10 MG tablet Take 10 mg by mouth daily.    . metFORMIN (GLUCOPHAGE) 850 MG tablet  Take 1 tablet (850 mg total) by mouth 2 (two) times daily with a meal. 60 tablet 0  . naltrexone (DEPADE) 50 MG tablet Take 1 tablet (50 mg total) by mouth daily. 30 tablet 0  . phenazopyridine (PYRIDIUM) 200 MG tablet Take 1 tablet (200 mg total) by mouth 3 (three) times daily as needed (for pain with urination). 30 tablet 0  . tamsulosin (FLOMAX) 0.4 MG CAPS capsule Take 1 capsule (0.4 mg total) by mouth daily after supper. (Patient taking differently: Take 0.4 mg by mouth daily. ) 30 capsule 0   No current facility-administered medications for this visit.     Allergies: No Known Allergies   Physical Exam: Blood pressure 124/90, pulse 100, temperature 97.6 F (36.4 C), temperature source Temporal, resp. rate 18, weight 211 lb (95.7 kg), SpO2 99 %.   ECOG: 0 General appearance: alert and cooperative appeared without distress. Head: Normocephalic, without obvious abnormality Oropharynx: No oral thrush or ulcers. Eyes: No scleral icterus.  Pupils are equal and round reactive to light. Lymph nodes: Cervical, supraclavicular, and axillary nodes normal. Heart:regular rate and rhythm, S1, S2 normal, no murmur, click, rub or gallop Lung:chest clear, no wheezing, rales, normal symmetric air entry Abdomin: soft, non-tender, without masses or organomegaly. Neurological: No motor, sensory deficits.  Intact deep tendon reflexes. Skin: No rashes or lesions.  No ecchymosis or petechiae. Musculoskeletal: No joint deformity or effusion. Psychiatric: Mood and affect are  appropriate.    Lab Results: Lab Results  Component Value Date   WBC 7.2 05/03/2019   HGB 15.6 05/29/2019   HCT 46.0 05/29/2019   MCV 91.2 05/03/2019   PLT 329 05/03/2019     Chemistry      Component Value Date/Time   NA 140 05/29/2019 0930   K 3.6 05/29/2019 0930   CL 100 05/29/2019 0930   CO2 24 05/03/2019 2303   BUN 12 05/29/2019 0930   CREATININE 0.90 05/29/2019 0930      Component Value Date/Time   CALCIUM  9.0 05/03/2019 2303   ALKPHOS 46 05/03/2019 2303   AST 23 05/03/2019 2303   ALT 26 05/03/2019 2303   BILITOT 1.0 05/03/2019 2303         Impression and Plan:   54 year old man with:  1.  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  The natural course of this disease was reviewed again and the role of systemic therapy escalation using Zytiga or Xtandi or systemic chemotherapy was reviewed again.  He is under evaluation to receive definitive therapy with radiation.  Alternative options would include androgen deprivation therapy alone.  Given his high risk disease and high PSA is reasonable to assume that he has more widespread disease and detected and could benefit from therapy escalation.  After discussion today, he is agreeable to proceed with Zytiga.  Complication clinic hypertension, hypokalemia and edema were reviewed.  We will hold off on prednisone given his history of diabetes.   2.  Lower urinary tract symptoms: He status post TURP procedure.   3.  Androgen deprivation: This will need to be continued at least for the next 2 years.  This can be discontinued if he has a complete response PSA undetectable  In the future   4.   Follow-up: Will be in the next 2 months for repeat evaluation.  30  minutes were spent on this encounter.  The time was dedicated to updating his disease status, discussing treatment options and outlining future plan of care.     Zola Button, MD 6/29/202112:51 PM

## 2019-08-06 NOTE — Telephone Encounter (Signed)
Scheduled appt per 6/29 sch message - mailed reminder letter with appt date and time

## 2019-08-06 NOTE — Telephone Encounter (Signed)
Oral Oncology Patient Advocate Encounter  Received notification from [employer] that prior authorization for Jonathan Austin is required.  PA submitted on CoverMyMeds Key [id number] Status is pending  Oral Oncology Clinic will continue to follow.  [place] Patient [place] Phone [phone] Fax [phone] 08/07/2019 9:55 AM

## 2019-08-06 NOTE — Telephone Encounter (Signed)
Oral Oncology Pharmacist Encounter  Received new prescription for Zytiga (abiraterone) for the treatment of castrate sensitive prostate cancer, planned duration until disease progression or unacceptable drug toxicity. MD holding prednisone due to patient's diabetes.  CMP lab order entered. CMP from 05/03/19 assessed, no relevant lab abnormalities. Prescription dose and frequency assessed.   Current medication list in Epic reviewed, one DDIs with abiraterone identified: -Tamsulosin: Abiraterone may increase the concentration of tamsulosin, monitor for increased tamsulosin effects (eg, hypotension, orthostasis). No baseline dose adjustments needed.  Prescription has been e-scribed to the [facility] for benefits analysis and approval.  Oral Oncology Clinic will continue to follow for insurance authorization, copayment issues, initial counseling and start date.  Darl Pikes, PharmD, BCPS, BCOP, CPP Hematology/Oncology Clinical Pharmacist Practitioner [facility]/AP [facility] [phone]  08/07/2019 8:58 AM

## 2019-08-07 MED ORDER — ABIRATERONE ACETATE 250 MG PO TABS: 1000.0000 mg | ORAL_TABLET | Freq: Every day | ORAL | 0 refills | Status: DC

## 2019-08-07 NOTE — Telephone Encounter (Addendum)
Oral Chemotherapy Pharmacist Encounter  Due to insurance restriction the medication could not be filled at [facility]. Prescription has been e-scribed to [employer].  Supportive information was faxed to [facility]. We will continue to follow medication access.   Attempting to call patient to let them know, no answer, LVM for patient to call me back.  Darl Pikes, PharmD, BCPS, [facility] Hematology/Oncology Clinical Pharmacist [facility] [phone]  08/07/2019 9:34 AM

## 2019-08-07 NOTE — Telephone Encounter (Signed)
Oral Oncology Patient Advocate Encounter  Prior Authorization for Abiraterone has been approved.    PA# [id number] Effective dates: 08/07/19 through 08/06/20  Prescription has been sent to [facility] per insurance.  Oral Oncology Clinic will continue to follow.   [place] Patient [place] Phone [phone] Fax [phone] 08/07/2019 10:22 AM

## 2019-08-14 ENCOUNTER — Telehealth: Payer: Self-pay | Admitting: *Deleted

## 2019-08-14 NOTE — Telephone Encounter (Signed)
Called patient to remind of sim and MRI for 08-16-19, spoke with patient and he is aware of these appts.

## 2019-08-16 ENCOUNTER — Encounter: Payer: Self-pay | Admitting: Medical Oncology

## 2019-08-16 ENCOUNTER — Telehealth: Payer: Self-pay | Admitting: *Deleted

## 2019-08-16 ENCOUNTER — Ambulatory Visit (HOSPITAL_COMMUNITY): Payer: 59

## 2019-08-16 ENCOUNTER — Ambulatory Visit
Admission: RE | Admit: 2019-08-16 | Discharge: 2019-08-16 | Disposition: A | Discharge status: Home / Self Care | Payer: 59 | Source: Ambulatory Visit | Attending: Radiation Oncology | Admitting: Radiation Oncology

## 2019-08-16 ENCOUNTER — Encounter (HOSPITAL_COMMUNITY): Payer: Self-pay

## 2019-08-16 DIAGNOSIS — R599 Enlarged lymph nodes, unspecified: Secondary | ICD-10-CM | POA: Diagnosis present

## 2019-08-16 DIAGNOSIS — C61 Malignant neoplasm of prostate: Secondary | ICD-10-CM

## 2019-08-16 NOTE — Progress Notes (Signed)
I asked patient if he has received his Zytiga. He and his wife state they have not received any updates. I reached out to  Darl Pikes, PharmD to follow up. She informed me,  Prescription was forwarded to  to [employer] due to insurance.[place] and  [employer], has tried to reach patient without success.  I informed patient and his wife of the above and asked them to call [employer] [phone]) to set up shipment.  They voiced understanding of the above and will call them today. I asked them to call me with questions or concerns. I confirmed they have my contact information.

## 2019-08-16 NOTE — Progress Notes (Signed)
  Radiation Oncology         [id number]) [phone] ________________________________  Name: Jonathan Austin MRN: [id number]  Date: 08/16/2019  DOB: [birth year]  SIMULATION AND TREATMENT PLANNING NOTE    ICD-10-CM   1. Malignant neoplasm of prostate ([place])  C61     DIAGNOSIS:   54 y.o. gentleman with locally advanced, Stage T1c adenocarcinoma of the prostate with a Gleason's score of 4+4 and a PSA of 317 with left iliac and pre-sacral adenopathy.  NARRATIVE:  The patient was brought to the [facility].  Identity was confirmed.  All relevant records and images related to the planned course of therapy were reviewed.  The patient freely provided informed written consent to proceed with treatment after reviewing the details related to the planned course of therapy. The consent form was witnessed and verified by the simulation staff.  Then, the patient was set-up in a stable reproducible supine position for radiation therapy.  A vacuum lock pillow device was custom fabricated to position his legs in a reproducible immobilized position.  Then, I performed a urethrogram under sterile conditions to identify the prostatic bed.  CT images were obtained.  Surface markings were placed.  The CT images were loaded into the planning software.  Then the prostate bed target, pelvic lymph node target and avoidance structures including the rectum, bladder, bowel and hips were contoured.  Treatment planning then occurred.  The radiation prescription was entered and confirmed.  A total of one complex treatment devices were fabricated. I have requested : Intensity Modulated Radiotherapy (IMRT) is medically necessary for this case for the following reason:  Rectal sparing.Marland Kitchen  PLAN:  The patient will receive 45 Gy in 25 fractions of 1.8 Gy, followed by a boost to the prostate and gross adenopathy to a total dose of 75 Gy with 15 additional fractions of 2 Gy.   ________________________________  [name] Tammi Klippel, M.D.

## 2019-08-16 NOTE — Telephone Encounter (Signed)
CALLED PATIENT TO INFORM THAT HE DOES NOT NEED TO HAVE THIS MRI DONE TODAY, DUE TO NOT HAVING HIS SPACE OAR GEL PLACED @ [facility], SPOKE WITH PATIENT AND HE IS AWARE OF THIS

## 2019-08-19 ENCOUNTER — Encounter: Payer: Self-pay | Admitting: Medical Oncology

## 2019-08-19 ENCOUNTER — Telehealth: Payer: Self-pay

## 2019-08-19 NOTE — Progress Notes (Signed)
Reached out to Nuala Alpha, PharmD and Wynn Maudlin, CPhT,regarding financial assistance with Zytiga co-pay. They will investigate and get back me.

## 2019-08-19 NOTE — Progress Notes (Signed)
Spoke with Tamela Oddi, wife to confirm they are aware we were able to get co-pay assistance. She states she did speak with Raquel Sarna earlier. She and her husband voiced their appreciation and gratefulness for the help. I asked her to reach out to  me with any further questions or concerns. She voiced understanding. He will begin radiation 7/20.

## 2019-08-19 NOTE — Telephone Encounter (Signed)
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 MD holding prednisone due to patient's diabetes.   Counseled Doris on administration, dosing, side effects, monitoring, drug-food interactions, safe handling, storage, and disposal. Patient will take 4 tablets (1,000 mg total) by mouth daily. Take on an empty stomach 1 hour before or 2 hours after a meal.  Side effects include but not limited to: HTN, fatigue, decreased wbc.    Reviewed with Tamela Oddi importance of keeping a medication schedule and plan for any missed doses.  Doris voiced understanding and appreciation. All questions answered. Medication handout placed in the mail.  Provided Doris with [facility] phone number. Doris knows to call the office with questions or concerns. [facility] will continue to follow.  Darl Pikes, PharmD, BCPS, BCOP, CPP Hematology/Oncology Clinical Pharmacist Practitioner [facility] [phone]  08/19/2019 4:18 PM

## 2019-08-19 NOTE — Progress Notes (Signed)
Patient called asking if our office can fax records to [facility]. He  Has applied for disability. I will follow up and call him back.

## 2019-08-19 NOTE — Telephone Encounter (Addendum)
Oral Oncology Patient Advocate Encounter  Patient has been approved for copay assistance with [employer] (TAF).  The [place] will cover all copayment expenses for Zytiga for the remainder of the calendar year.    The billing information is as follows and has been shared with Cabin crew.   Member ID: [id number] Group ID: [id number] PCN: [id number] BIN: [id number] Eligibility Dates: 02/08/19 to 02/07/20  Fund: [place] Patient [place] Phone [phone] Fax [phone] 08/19/2019 1:36 PM

## 2019-08-19 NOTE — Progress Notes (Signed)
I attempted to return call to patient but mailbox full. I spoke with Tamela Oddi, wife to let her know our office did receive request from [facility] for records on 7/9.  I asked her if they were able to speak with [place] regarding Zytiga prescription. She states they have tried a few times but were unable to thru. I will contact them and get information regarding co-pay and call her back. She is very appreciative of my help.

## 2019-08-19 NOTE — Telephone Encounter (Signed)
Oral Chemotherapy Pharmacist Encounter   Attempted to call patient fo Zytiga medication education. LVM on wife's number for them to call me back.  Darl Pikes, PharmD, BCPS, BCOP, CPP Hematology/Oncology Clinical Pharmacist [facility] [phone]  08/19/2019 4:16 PM

## 2019-08-19 NOTE — Progress Notes (Signed)
Spoke with Tamela Oddi to let her know co-pay for Jonathan Austin is 62.70. She states they do not have the funds to purchase the medication at this time. She receives a SS check and is working a part time job, just to pay bills. I will reach out to the oral pharmacist to see what assistance is available. I will call her back with an update. She voiced appreciation.

## 2019-08-20 ENCOUNTER — Encounter: Payer: Self-pay | Admitting: Medical Oncology

## 2019-08-20 ENCOUNTER — Encounter: Payer: Self-pay | Admitting: Radiation Oncology

## 2019-08-20 NOTE — Progress Notes (Signed)
Spoke with patient to follow up on Zytiga shipment. He states they have not called but will call after our call. I stressed the importance of getting started on the medication to control his cancer. He voiced understanding. I asked him to let me know when he receives the medication.

## 2019-08-20 NOTE — Progress Notes (Signed)
Jonathan Austin- wife called stating she contacted [employer] and Fabio Asa will be shipped tomorrow.

## 2019-08-20 NOTE — Progress Notes (Signed)
Patient called, left voicemail with financial concerns. He is currently not working and is applying for disability. I referred him to Nance Pew and she will reach out to him or his wife. I attempted to call him but his mailbox is full.

## 2019-08-20 NOTE — Progress Notes (Signed)
Called and spoke with patients wife and informed her of the [employer] and the information needed to sign Jonathan Austin up.  She states she will bring info in when he starts treatment.

## 2019-08-23 ENCOUNTER — Other Ambulatory Visit: Payer: Self-pay | Admitting: Oncology

## 2019-08-23 DIAGNOSIS — C61 Malignant neoplasm of prostate: Secondary | ICD-10-CM

## 2019-08-25 ENCOUNTER — Encounter (HOSPITAL_COMMUNITY): Payer: Self-pay | Admitting: Emergency Medicine

## 2019-08-25 ENCOUNTER — Emergency Department (HOSPITAL_COMMUNITY)
Admission: EM | Admit: 2019-08-25 | Discharge: 2019-08-26 | Disposition: A | Discharge status: Home / Self Care | Payer: 59 | Attending: Emergency Medicine | Admitting: Emergency Medicine

## 2019-08-25 ENCOUNTER — Other Ambulatory Visit: Payer: Self-pay

## 2019-08-25 DIAGNOSIS — Z7982 Long term (current) use of aspirin: Secondary | ICD-10-CM | POA: Diagnosis not present

## 2019-08-25 DIAGNOSIS — Z20822 Contact with and (suspected) exposure to covid-19: Secondary | ICD-10-CM | POA: Insufficient documentation

## 2019-08-25 DIAGNOSIS — R45851 Suicidal ideations: Secondary | ICD-10-CM | POA: Insufficient documentation

## 2019-08-25 DIAGNOSIS — E1165 Type 2 diabetes mellitus with hyperglycemia: Secondary | ICD-10-CM | POA: Insufficient documentation

## 2019-08-25 DIAGNOSIS — F1420 Cocaine dependence, uncomplicated: Secondary | ICD-10-CM | POA: Diagnosis not present

## 2019-08-25 DIAGNOSIS — Z7984 Long term (current) use of oral hypoglycemic drugs: Secondary | ICD-10-CM | POA: Insufficient documentation

## 2019-08-25 DIAGNOSIS — Z79899 Other long term (current) drug therapy: Secondary | ICD-10-CM | POA: Diagnosis not present

## 2019-08-25 DIAGNOSIS — C61 Malignant neoplasm of prostate: Secondary | ICD-10-CM | POA: Diagnosis not present

## 2019-08-25 DIAGNOSIS — F1020 Alcohol dependence, uncomplicated: Secondary | ICD-10-CM | POA: Diagnosis not present

## 2019-08-25 DIAGNOSIS — I10 Essential (primary) hypertension: Secondary | ICD-10-CM | POA: Insufficient documentation

## 2019-08-25 DIAGNOSIS — F332 Major depressive disorder, recurrent severe without psychotic features: Secondary | ICD-10-CM

## 2019-08-25 LAB — CBC
HCT: 47.9 % (ref 39.0–52.0)
HCT: 45.6 % (ref 39.0–52.0)
Hemoglobin: 16.3 g/dL (ref 13.0–17.0)
Hemoglobin: 15.2 g/dL (ref 13.0–17.0)
MCH: 30.4 pg (ref 26.0–34.0)
MCH: 29.7 pg (ref 26.0–34.0)
MCHC: 34 g/dL (ref 30.0–36.0)
MCHC: 33.3 g/dL (ref 30.0–36.0)
MCV: 89.2 fL (ref 80.0–100.0)
MCV: 89.1 fL (ref 80.0–100.0)
Platelets: 375 10*3/uL (ref 150–400)
Platelets: 325 10*3/uL (ref 150–400)
RBC: 5.37 MIL/uL (ref 4.22–5.81)
RBC: 5.12 MIL/uL (ref 4.22–5.81)
RDW: 13.2 % (ref 11.5–15.5)
RDW: 13.1 % (ref 11.5–15.5)
WBC: 7.6 10*3/uL (ref 4.0–10.5)
WBC: 4.4 10*3/uL (ref 4.0–10.5)
nRBC: 0 % (ref 0.0–0.2)
nRBC: 0 % (ref 0.0–0.2)

## 2019-08-25 LAB — SARS CORONAVIRUS 2 BY RT PCR (HOSPITAL ORDER, PERFORMED IN ~~LOC~~ HOSPITAL LAB): SARS Coronavirus 2: NEGATIVE

## 2019-08-25 LAB — COMPREHENSIVE METABOLIC PANEL
ALT: 26 U/L (ref 0–44)
AST: 19 U/L (ref 15–41)
Albumin: 3.8 g/dL (ref 3.5–5.0)
Alkaline Phosphatase: 58 U/L (ref 38–126)
Anion gap: 17 — ABNORMAL HIGH (ref 5–15)
BUN: 12 mg/dL (ref 6–20)
CO2: 20 mmol/L — ABNORMAL LOW (ref 22–32)
Calcium: 9 mg/dL (ref 8.9–10.3)
Chloride: 91 mmol/L — ABNORMAL LOW (ref 98–111)
Creatinine, Ser: 1.09 mg/dL (ref 0.61–1.24)
GFR calc Af Amer: >60 mL/min (ref >60)
GFR calc non Af Amer: >60 mL/min (ref >60)
Glucose, Bld: 380 mg/dL — ABNORMAL HIGH (ref 70–99)
Potassium: 4.7 mmol/L (ref 3.5–5.1)
Sodium: 128 mmol/L — ABNORMAL LOW (ref 135–145)
Total Bilirubin: 0.7 mg/dL (ref 0.3–1.2)
Total Protein: 7.7 g/dL (ref 6.5–8.1)

## 2019-08-25 LAB — RAPID URINE DRUG SCREEN, HOSP PERFORMED
Amphetamines: NOT DETECTED
Barbiturates: NOT DETECTED
Benzodiazepines: NOT DETECTED
Cocaine: POSITIVE — AB
Opiates: NOT DETECTED
Tetrahydrocannabinol: NOT DETECTED

## 2019-08-25 LAB — BASIC METABOLIC PANEL
Anion gap: 14 (ref 5–15)
BUN: 12 mg/dL (ref 6–20)
CO2: 23 mmol/L (ref 22–32)
Calcium: 8.5 mg/dL — ABNORMAL LOW (ref 8.9–10.3)
Chloride: 98 mmol/L (ref 98–111)
Creatinine, Ser: 0.97 mg/dL (ref 0.61–1.24)
GFR calc Af Amer: >60 mL/min (ref >60)
GFR calc non Af Amer: >60 mL/min (ref >60)
Glucose, Bld: 274 mg/dL — ABNORMAL HIGH (ref 70–99)
Potassium: 4.5 mmol/L (ref 3.5–5.1)
Sodium: 135 mmol/L (ref 135–145)

## 2019-08-25 LAB — ACETAMINOPHEN LEVEL: Acetaminophen (Tylenol), Serum: <10 ug/mL — ABNORMAL LOW (ref 10–30)

## 2019-08-25 LAB — SALICYLATE LEVEL: Salicylate Lvl: <7 mg/dL — ABNORMAL LOW (ref 7.0–30.0)

## 2019-08-25 LAB — PHOSPHORUS: Phosphorus: 2.6 mg/dL (ref 2.5–4.6)

## 2019-08-25 LAB — CBG MONITORING, ED: Glucose-Capillary: 282 mg/dL — ABNORMAL HIGH (ref 70–99)

## 2019-08-25 LAB — MAGNESIUM: Magnesium: 2.2 mg/dL (ref 1.7–2.4)

## 2019-08-25 LAB — ETHANOL: Alcohol, Ethyl (B): 142 mg/dL — ABNORMAL HIGH (ref <10)

## 2019-08-25 MED ORDER — AMLODIPINE BESYLATE 5 MG PO TABS
10.0000 mg | ORAL_TABLET | Freq: Every day | ORAL | Status: DC
  Filled 2019-08-25: qty 2

## 2019-08-25 MED ORDER — ADULT MULTIVITAMIN W/MINERALS CH: 1.0000 | ORAL_TABLET | Freq: Every day | ORAL | Status: DC

## 2019-08-25 MED ORDER — ABIRATERONE ACETATE 250 MG PO TABS
1000.0000 mg | ORAL_TABLET | Freq: Every day | ORAL | Status: DC
  Filled 2019-08-25: qty 4

## 2019-08-25 MED ORDER — INSULIN ASPART PROT & ASPART (70-30 MIX) 100 UNIT/ML ~~LOC~~ SUSP
12.0000 [IU] | Freq: Every day | SUBCUTANEOUS | Status: DC
  Administered 2019-08-25: 12 [IU] via SUBCUTANEOUS
  Filled 2019-08-25: qty 10

## 2019-08-25 MED ORDER — INSULIN ASPART PROT & ASPART (70-30 MIX) 100 UNIT/ML ~~LOC~~ SUSP
70.0000 [IU] | Freq: Every day | SUBCUTANEOUS | Status: DC
  Filled 2019-08-25: qty 10

## 2019-08-25 MED ORDER — PREDNISONE 5 MG PO TABS
5.0000 mg | ORAL_TABLET | Freq: Every day | ORAL | Status: DC
  Administered 2019-08-25: 5 mg via ORAL
  Filled 2019-08-25 (×2): qty 1

## 2019-08-25 MED ORDER — ABIRATERONE ACETATE 250 MG PO TABS
1000.0000 mg | ORAL_TABLET | Freq: Once | ORAL | Status: AC
  Administered 2019-08-25: 1000 mg via ORAL

## 2019-08-25 MED ORDER — THIAMINE HCL 100 MG PO TABS: 100.0000 mg | ORAL_TABLET | Freq: Every day | ORAL | Status: DC

## 2019-08-25 MED ORDER — SODIUM CHLORIDE 0.9 % IV BOLUS
1000.0000 mL | Freq: Once | INTRAVENOUS | Status: AC
  Administered 2019-08-25: 1000 mL via INTRAVENOUS

## 2019-08-25 MED ORDER — FOLIC ACID 1 MG PO TABS: 1.0000 mg | ORAL_TABLET | Freq: Every day | ORAL | Status: DC

## 2019-08-25 MED ORDER — METFORMIN HCL 850 MG PO TABS
850.0000 mg | ORAL_TABLET | Freq: Two times a day (BID) | ORAL | Status: DC
  Filled 2019-08-25 (×4): qty 1

## 2019-08-25 MED ORDER — LORAZEPAM 1 MG PO TABS: 0.0000 mg | ORAL_TABLET | Freq: Four times a day (QID) | ORAL | Status: DC

## 2019-08-25 MED ORDER — ASPIRIN EC 81 MG PO TBEC
81.0000 mg | DELAYED_RELEASE_TABLET | Freq: Every day | ORAL | Status: DC
  Filled 2019-08-25: qty 1

## 2019-08-25 MED ORDER — ABIRATERONE ACETATE 250 MG PO TABS
1000.0000 mg | ORAL_TABLET | Freq: Once | ORAL | Status: DC
  Filled 2019-08-25: qty 4

## 2019-08-25 MED ORDER — TAMSULOSIN HCL 0.4 MG PO CAPS: 0.4000 mg | ORAL_CAPSULE | Freq: Every day | ORAL | Status: DC

## 2019-08-25 MED ORDER — GLIPIZIDE 5 MG PO TABS
5.0000 mg | ORAL_TABLET | Freq: Every day | ORAL | Status: DC
  Administered 2019-08-26: 5 mg via ORAL
  Filled 2019-08-25 (×2): qty 1

## 2019-08-25 MED ORDER — LORAZEPAM 1 MG PO TABS: 0.0000 mg | ORAL_TABLET | Freq: Two times a day (BID) | ORAL | Status: DC

## 2019-08-25 MED ORDER — THIAMINE HCL 100 MG/ML IJ SOLN: 100.0000 mg | Freq: Every day | INTRAMUSCULAR | Status: DC

## 2019-08-25 MED ORDER — ABIRATERONE ACETATE 250 MG PO TABS: 1000.0000 mg | ORAL_TABLET | Freq: Every day | ORAL | Status: DC

## 2019-08-25 NOTE — ED Notes (Addendum)
Patient belongings include:  1 khaki t shirt 1 pair navy sweat pants 1 pair black tennis shoes 1 pair grey socks 1 white baseball cap 1 black phone cord and block 1 black wallet 1 black smart phone
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
## 2019-08-25 NOTE — ED Notes (Signed)
Assisted pt with toileting needs by providing urinal. Assisted pt with placement of cpap machine.

## 2019-08-25 NOTE — BH Assessment (Signed)
Received TTS consult request. Spoke to Chestine Spore, RN who said Pt is intoxicated and unable to stay awake to answer questions at this time. TTS will complete assessment when Pt is able to participate.   Evelena Peat, [facility], [facility] Triage Specialist [phone]

## 2019-08-25 NOTE — ED Notes (Signed)
EDP made aware of CBG and will order home med.

## 2019-08-25 NOTE — ED Notes (Signed)
Meal tray still has not come up. Secretary aware.   Patient given sandwich and crackers.

## 2019-08-25 NOTE — ED Notes (Signed)
Called pharmacy for pts dose of Abiraterone. Also consulted with EDP for prescription of prednisone per oral chemo instructions provided by pts oncologist.

## 2019-08-25 NOTE — ED Notes (Addendum)
Patient refusing all meds except insulin. Per patient he was told not to take those medication due to starting treatment next week for prostate cancer. Patient also states he has not been taking the medications anyways and only takes insulin at home.   Patient calm and polite.

## 2019-08-25 NOTE — ED Notes (Signed)
Pt ambulated to restroom without assistance.

## 2019-08-25 NOTE — ED Notes (Signed)
Patient refusing vitamins at this time. Patient states he will call his wife to see if she can bring the list of medications he is not suppose to take before treatment next week.

## 2019-08-25 NOTE — ED Notes (Signed)
Patient given meal tray.

## 2019-08-25 NOTE — ED Notes (Signed)
This RN made patient aware he will need to supply his on ZYTIGA 250 mg tablet from home. Patient calling wife to see if she can bring it.

## 2019-08-25 NOTE — ED Triage Notes (Signed)
Patient arrives accompanied by [employer] with complaints of suicidal ideation. Patient states he has been fighting prostate cancer and is "tired." Patient states he also feels bad for being an addict. No plan per patient. Patient states his last drink and crack cocaine use was just PTA. Patient is cooperative and tearful.

## 2019-08-25 NOTE — ED Notes (Signed)
Patient requesting to use phone. Patient using phone at nurses station.

## 2019-08-25 NOTE — ED Notes (Signed)
Waiting for meal tray to give NOVOLOG MIX.

## 2019-08-25 NOTE — ED Notes (Signed)
Patient speaking with TTS at this time. Patient A/o, calm, and cooperative.

## 2019-08-25 NOTE — ED Provider Notes (Signed)
[facility] DEPT Provider Note   CSN: [id number] Arrival date & time: 08/25/19  0125     History Chief Complaint  Patient presents with  . Suicidal    Jonathan Austin is a 54 y.o. male.  Patient presents to the emergency department with a chief complaint of suicidal thoughts.  He states he has been under a lot of emotional and financial stress.  He has prostate cancer and is beginning radiation therapy this week.  He states that he is also waiting for his disability to come through, so he is under a lot of financial pressure.  He states that he smoked cocaine and drank alcohol about 2 hours ago tonight.  He reports generalized body aches, but otherwise does not complain of any symptoms at this time.  He denies any other issues presently.  The history is provided by the patient. No language interpreter was used.       Past Medical History:  Diagnosis Date  . Alcohol use disorder, severe, dependence ([place])    05-24-2019 recent [facility] admission for suicide ideation and alcohol / cocaine detox 05-04-2019 in epic,  pt stated has been taking naltrexone daily as prescribed since and no alcohol since  . Cocaine abuse ([place])    05-24-2019  pt had recent [facility] admission, pt stated last used  "crack" 05-03-2019  . Cocaine abuse with cocaine-induced mood disorder ([place])   . Depression   . History of suicidal ideation    multiple [facility] admission's;  last one admitted 05-04-2019  . Hyperplasia of prostate with lower urinary tract symptoms (LUTS)   . Hypertension    followed by pcp  . OSA on CPAP    study 10-02-2012 in epic  . Phimosis   . Prostate cancer [facility]) urologist-- dr winter   dx 02/ 2021;  localized advanced, gleason 4+4, PSA 317  . Type 2 diabetes mellitus treated with insulin ([place])    followed by pcp   (05-24-2019  per stated checks blood sugar twice daily, fasting sugar-- 180-200)  . Wears glasses     Patient Active Problem List   Diagnosis Date Noted    . MDD (major depressive disorder) 05/04/2019  . Malignant neoplasm of prostate ([place]) 04/30/2019  . MDD (major depressive disorder), recurrent episode, severe ([place]) 01/24/2018  . Polysubstance dependence including opioid drug with daily use ([place]) 03/16/2017  . MDD (major depressive disorder), severe ([place]) 03/15/2017  . Cocaine use disorder, severe, dependence ([place]) 07/19/2016  . Major depressive disorder, recurrent episode ([place]) 07/16/2016  . Alcohol use disorder, severe, dependence ([place]) 07/16/2016  . Thoracic aortic atherosclerosis ([place]) 05/09/2016  . Influenza B 05/09/2016  . Dehydration   . Lactic acidosis   . Severe recurrent major depression without psychotic features ([place]) 02/21/2016  . Renal insufficiency 01/20/2016  . HTN (hypertension) 09/20/2015  . HLD (hyperlipidemia) 09/20/2015  . Substance abuse ([place]) 08/30/2014  . Substance induced mood disorder ([place]) 05/07/2014  . Cocaine abuse with cocaine-induced mood disorder ([place])   . Alcohol abuse   . Sinus tachycardia 02/12/2014  . Cellulitis 06/28/2013  . Uncontrolled diabetes mellitus with hyperglycemia ([place]) 06/28/2013  . Marijuana abuse 06/28/2013  . Diabetes mellitus type II, uncontrolled ([place]) 12/09/2012    Past Surgical History:  Procedure Laterality Date  . CIRCUMCISION N/A 05/29/2019   Procedure: CIRCUMCISION ADULT;  Surgeon: Ceasar Mons, MD;  Location: [facility];  Service: Urology;  Laterality: N/A;  . PROSTATE BIOPSY    . TONSILLECTOMY  child  .  TRANSURETHRAL RESECTION OF PROSTATE N/A 05/29/2019   Procedure: TRANSURETHRAL RESECTION OF THE PROSTATE (TURP)/ CYSTOSCOPY;  Surgeon: Ceasar Mons, MD;  Location: [facility];  Service: Urology;  Laterality: N/A;       Family History  Problem Relation Age of Onset  . Hypertension Mother   . Hypertension Father   . Diabetes Father   . Prostate cancer Maternal Grandfather 96  . Breast cancer Neg Hx   . Colon  cancer Neg Hx   . Pancreatic cancer Neg Hx     Social History   Tobacco Use  . Smoking status: Never Smoker  . Smokeless tobacco: Never Used  Vaping Use  . Vaping Use: Never used  Substance Use Topics  . Alcohol use: Yes    Comment: drinks beer 40 oz x 2 daily  (05-24-2019 per pt no alcohol since 05-03-2019)  . Drug use: Yes    Types: Cocaine, Marijuana    Comment: smokes 2g crack daily  (05-24-2019 per pt last crack and marijuana 05-03-2019)    Home Medications Prior to Admission medications   Medication Sig Start Date End Date Taking? Authorizing Provider  abiraterone acetate (ZYTIGA) 250 MG tablet Take 4 tablets by mouth daily *Take on an empty stomach 1 hour BEFORE or 2 hours AFTER a meal. 08/23/19   Shadad, Mathis Dad, MD  amLODipine (NORVASC) 10 MG tablet Take 1 tablet (10 mg total) by mouth daily. For high blood pressure 01/27/18   Lindell Spar I, NP  aspirin 81 MG EC tablet Take 1 tablet (81 mg total) by mouth daily. Swallow whole. 05/08/19   Connye Burkitt, NP  cholecalciferol (VITAMIN D3) 10 MCG (400 UNIT) TABS tablet Take 1 tablet (400 Units total) by mouth daily. 05/08/19   Connye Burkitt, NP  glipiZIDE (GLUCOTROL) 5 MG tablet Take 1 tablet (5 mg total) by mouth daily before breakfast. 05/08/19   Connye Burkitt, NP  HYDROcodone-acetaminophen (NORCO) 5-325 MG tablet Take 1 tablet by mouth every 4 (four) hours as needed for moderate pain. 05/29/19   Ceasar Mons, MD  Insulin Aspart Prot & Aspart (NOVOLOG MIX 70/30 [place]) Inject into the skin 2 (two) times daily. Per pt ,  70 units in AM;  12 units in PM    [provider]  LIPITOR 10 MG tablet Take 10 mg by mouth daily. 02/28/19   [provider]  metFORMIN (GLUCOPHAGE) 850 MG tablet Take 1 tablet (850 mg total) by mouth 2 (two) times daily with a meal. 05/07/19   Connye Burkitt, NP  naltrexone (DEPADE) 50 MG tablet Take 1 tablet (50 mg total) by mouth daily. 05/08/19   Connye Burkitt, NP  phenazopyridine  (PYRIDIUM) 200 MG tablet Take 1 tablet (200 mg total) by mouth 3 (three) times daily as needed (for pain with urination). 05/29/19 05/28/20  Ceasar Mons, MD  tamsulosin (FLOMAX) 0.4 MG CAPS capsule Take 1 capsule (0.4 mg total) by mouth daily after supper. Patient taking differently: Take 0.4 mg by mouth daily.  05/07/19   Connye Burkitt, NP    Allergies    Patient has no known allergies.  Review of Systems   Review of Systems  All other systems reviewed and are negative.   Physical Exam Updated Vital Signs BP (!) 132/95   Pulse (!) 123   Temp 97.9 F (36.6 C) (Oral)   Resp 20   SpO2 95%   Physical Exam Vitals and nursing note reviewed.  Constitutional:      Appearance: He is well-developed.  HENT:     Head: Normocephalic and atraumatic.  Eyes:     Conjunctiva/sclera: Conjunctivae normal.  Cardiovascular:     Rate and Rhythm: Regular rhythm. Tachycardia present.     Heart sounds: No murmur heard.   Pulmonary:     Effort: Pulmonary effort is normal. No respiratory distress.     Breath sounds: Normal breath sounds.  Abdominal:     Palpations: Abdomen is soft.     Tenderness: There is no abdominal tenderness.  Musculoskeletal:     Cervical back: Neck supple.  Skin:    General: Skin is warm and dry.  Neurological:     Mental Status: He is alert and oriented to person, place, and time.  Psychiatric:        Mood and Affect: Mood normal.        Behavior: Behavior normal.        Thought Content: Thought content normal.        Judgment: Judgment normal.     ED Results / Procedures / Treatments   Labs (all labs ordered are listed, but only abnormal results are displayed) Labs Reviewed  COMPREHENSIVE METABOLIC PANEL  ETHANOL  SALICYLATE LEVEL  ACETAMINOPHEN LEVEL  CBC  RAPID URINE DRUG SCREEN, HOSP PERFORMED    EKG None  Radiology No results found.  Procedures Procedures (including critical care time)  Medications Ordered in ED Medications -  No data to display  ED Course  I have reviewed the triage vital signs and the nursing notes.  Pertinent labs & imaging results that were available during my care of the patient were reviewed by me and considered in my medical decision making (see chart for details).    MDM Rules/Calculators/A&P                         Patient is here voluntarily with suicidal thoughts.  He is depressed and under a lot of stress because of his health and financial problems.  He did use cocaine and alcohol earlier this evening.  He is noted to be mildly tachycardic.  He denies any chest pain.  We will give IV fluid for tachycardia.  Will check EKG.  Will check basic screening labs.  Will consult TTS for evaluation of suicidal thoughts.   Final Clinical Impression(s) / ED Diagnoses Final diagnoses:  Suicidal thoughts    Rx / DC Orders ED Discharge Orders    None       Montine Circle, PA-C 08/25/19 [place], Delice Bison, DO 08/25/19 [phone]

## 2019-08-26 DIAGNOSIS — C61 Malignant neoplasm of prostate: Secondary | ICD-10-CM | POA: Diagnosis not present

## 2019-08-26 LAB — CBG MONITORING, ED: Glucose-Capillary: 301 mg/dL — ABNORMAL HIGH (ref 70–99)

## 2019-08-26 LAB — PSA: Prostatic Specific Antigen: 338.61 ng/mL — ABNORMAL HIGH (ref 0.00–4.00)

## 2019-08-26 MED ORDER — INSULIN ASPART PROT & ASPART (70-30 MIX) 100 UNIT/ML ~~LOC~~ SUSP
7.0000 [IU] | Freq: Every day | SUBCUTANEOUS | Status: DC
  Administered 2019-08-26: 7 [IU] via SUBCUTANEOUS

## 2019-08-26 NOTE — ED Notes (Signed)
Pt upset regarding time in department and missing appointment at [facility] this morning.  Pt reports he has not been told anything and has no idea what he's waiting on.  Sts he is tired of "just laying here" and wants to go home.  Reports he is supposed to have lab work completed today, so he can start radiation tomorrow.  Currently, denying SI.  Attempted to explain the psych process to Pt, but he remained upset.  This Probation officer spoke to EDP about labwork Pt was supposed to have completed at [facility] today.  PSA ordered.  Pt informed.

## 2019-08-26 NOTE — Discharge Instructions (Signed)
To help you maintain a sober lifestyle, a substance abuse treatment program may be beneficial to you.  The providers listed below offer Chemical Dependeny Intensive Outpatient Programs.  These programs meet several hours a day, several days a week.  Contact them at your earliest opportunity to ask about enrolling:       [facility]. [address] [place], [place] [address]      [phone]       [facility]      [place], [place] [address]      [phone]

## 2019-08-26 NOTE — Progress Notes (Signed)
Received voicemail message from [place], [place] in ED. Phoned her back promptly. She reports the patient is in the emergency room and very concerned about his 0730 lab appointment. Explained his lab appointment is with [facility]. [facility] who explained he would have had a PSA and testosterone drawn. Explained he is in the ER at [facility] and will not make that appointment but, the ED plans to draw those labs for continuity of care. Phoned Alaina, RN back who committed to ensuring these labs get done. Will check patient status tomorrow since he is scheduled to start radiation therapy tomorrow. Also, later in the morning this RN received a call from [place], RN in ED requesting an update. Repeated events of the morning for her and she verbalized understanding.

## 2019-08-26 NOTE — ED Notes (Signed)
Patient received breakfast tray 

## 2019-08-26 NOTE — ED Notes (Signed)
Spoke to Therapist, sports at [employer].  In addition to previously drawn labs, Pt needs a PSA and Testosterone, Free and Total drawn.  Labs ordered under EDP [place] w/ his approval.

## 2019-08-26 NOTE — Progress Notes (Signed)
Reassessment: Patient seen. Chart reviewed. Jonathan Austin is a 54 year old with history of alcohol and cocaine use disorder. He presented to the ED intoxicated yesterday reporting SI. Patient is familiar to me from prior hospitalization in March 2021. He was discharged to follow up with [facility] at that time. He states he was unable to continue treatment there due to a change in his insurance.  On assessment today, patient reports he had relapsed on cocaine and alcohol related to stress from having prostate cancer, as well as financial stressors. He states he started having suicidal thoughts while using substances. He denies any SI/HI/AVH at this time. He reports mood is typically improved while abstaining from substance use. He is requesting discharge so that he can follow up with scheduled treatments for prostate cancer. He is also requesting referral for outpatient substance use treatment.  With patient's expressed consent, collateral information was obtained from his wife Jonathan Austin [phone]) by TTS counselor. She denies any safety concerns for discharge and is requesting he discharge for continued cancer treatments.  Per TTS assessment: Patient presented to the [facility] with suicidal ideation and plan to either hang himself or to overdose on pills.  Patient states, "I have a lot going on."  Patient has been diagnosed with Prostate Cancer and he states that he is unable to work and states that he has financial problems. He states that his son died six years ago and he continues to struggle with this loss.   He states that he has been very depressed and states that he has been abusing alcohol and cocaine in an attempt to self-medicate his emotions. He states that he has been using 3 grams of cocaine daily and he has been drinking 24 beers daily Patient states that he feels like he needs to be admitted to the hospital in order to get his life back on track in order for him to begin his treatment for his  cancer.  Disposition: Patient is psych cleared for discharge. Patient will be provided with substance use treatment options for his insurance. ED RN updated.

## 2019-08-26 NOTE — BH Assessment (Signed)
[facility] Assessment Progress Note  Per Harriett Sine, NP, this pt does not require psychiatric hospitalization at this time.  Pt is to be discharged from [facility] with referral information for area providers of Chemical Dependency Intensive Outpatient Programs.  This has been included in pt's discharge instructions.  Pt's nurse has been notified.  Jalene Mullet, [place] Triage Specialist [phone]

## 2019-08-26 NOTE — BH Assessment (Signed)
This Probation officer spoke with patient's wife Tamela Oddi [phone] with patient's consent, who did not voice any safety concerns in reference to patient being discharged this date. Wife reported to her knowledge patient has never attempted self harm and voices this date that she felt he would never harm himself but he is "really stressed with upcoming medical issues" so she is requesting he be provided with OP resources on discharge. Patient's wife reported she will assist with transportation back to her residence. This Probation officer spoke with patient at length about aftercare and counseling resources that will be provided on discharge to assist with ongoing needs.

## 2019-08-26 NOTE — Progress Notes (Signed)
TOC CM spoke to pt and states she does want help counseling to assist with his ETOH abuse. States he does not have PCP. [place] and requested in network provider. Providers were specialist that were provided. [place] and appt scheduled for 09/26/2019 at 10 am. Pt can contact [facility] ## [phone] for substance abuse per [facility]. Provided pt with the updated. Explained to pt that he can follow up with an attorney to assist with disability appeal. States he was recently denied for disability. [place], [place] ED TOC CM [phone]

## 2019-08-26 NOTE — ED Notes (Signed)
Attempted to call [facility] to inform them of Pt's status and ask about further orders.  Left VM w/ nurse.

## 2019-08-27 ENCOUNTER — Ambulatory Visit: Payer: 59

## 2019-08-27 ENCOUNTER — Encounter: Payer: Self-pay | Admitting: Medical Oncology

## 2019-08-27 NOTE — Progress Notes (Signed)
Patient called stating he will not be able to start radiation today but will be here tomorrow. Linac 2 notified.

## 2019-08-28 ENCOUNTER — Ambulatory Visit: Payer: 59

## 2019-08-29 ENCOUNTER — Other Ambulatory Visit: Payer: Self-pay

## 2019-08-29 ENCOUNTER — Encounter: Payer: Self-pay | Admitting: Medical Oncology

## 2019-08-29 ENCOUNTER — Ambulatory Visit
Admission: RE | Admit: 2019-08-29 | Discharge: 2019-08-29 | Disposition: A | Discharge status: Home / Self Care | Payer: 59 | Source: Ambulatory Visit | Attending: Radiation Oncology | Admitting: Radiation Oncology

## 2019-08-29 DIAGNOSIS — C61 Malignant neoplasm of prostate: Secondary | ICD-10-CM | POA: Diagnosis not present

## 2019-08-29 NOTE — Progress Notes (Signed)
Patient here with wife to start prostate radiation. Patient has been dealing with depression and was seen by [facility] this week. He sates he just needs to get his mind set right and do the treatments. Just knowing he has cancer and not having a job has really brought him down. He applied for disability but learned last week he was denied. He and his wife are meeting with Vincente [facility] after radiation today to discuss [employer]. I discussed our support services and encouraged them to reach out to me and I can make a referral. He and his wife confirmed he is taking Zytiga without any complications.

## 2019-08-29 NOTE — Progress Notes (Signed)
Patient called stating he will be here to start radiation today. He has been battling depression and feels better about things today.

## 2019-08-30 ENCOUNTER — Ambulatory Visit
Admission: RE | Admit: 2019-08-30 | Discharge: 2019-08-30 | Disposition: A | Discharge status: Home / Self Care | Payer: 59 | Source: Ambulatory Visit | Attending: Radiation Oncology | Admitting: Radiation Oncology

## 2019-08-30 ENCOUNTER — Other Ambulatory Visit: Payer: Self-pay

## 2019-08-30 DIAGNOSIS — C61 Malignant neoplasm of prostate: Secondary | ICD-10-CM | POA: Diagnosis not present

## 2019-08-30 LAB — TESTOSTERONE,FREE AND TOTAL
Testosterone, Free: 0.7 pg/mL — ABNORMAL LOW (ref 7.2–24.0)
Testosterone: 9 ng/dL — ABNORMAL LOW (ref 264–916)

## 2019-08-30 NOTE — Progress Notes (Signed)
Complete post sim education with patient today. Oriented patient to staff and routine of the clinic. Provided patient with RADIATION THERAPY AND YOU handbook then, reviewed pertinent information. Educated patient reference potential side effects and management such as fatigue, diarrhea and urinary/bladder changes. Answered all patient questions to the best of my ability. Provided patient with my business card and encouraged him to call with future needs. Patient verbalized understanding of all reviewed.  

## 2019-09-02 ENCOUNTER — Other Ambulatory Visit: Payer: Self-pay

## 2019-09-02 ENCOUNTER — Ambulatory Visit
Admission: RE | Admit: 2019-09-02 | Discharge: 2019-09-02 | Disposition: A | Discharge status: Home / Self Care | Payer: 59 | Source: Ambulatory Visit | Attending: Radiation Oncology | Admitting: Radiation Oncology

## 2019-09-02 DIAGNOSIS — C61 Malignant neoplasm of prostate: Secondary | ICD-10-CM | POA: Diagnosis not present

## 2019-09-03 ENCOUNTER — Other Ambulatory Visit: Payer: Self-pay

## 2019-09-03 ENCOUNTER — Ambulatory Visit
Admission: RE | Admit: 2019-09-03 | Discharge: 2019-09-03 | Disposition: A | Discharge status: Home / Self Care | Payer: 59 | Source: Ambulatory Visit | Attending: Radiation Oncology | Admitting: Radiation Oncology

## 2019-09-03 DIAGNOSIS — C61 Malignant neoplasm of prostate: Secondary | ICD-10-CM | POA: Diagnosis not present

## 2019-09-04 ENCOUNTER — Other Ambulatory Visit: Payer: Self-pay

## 2019-09-04 ENCOUNTER — Ambulatory Visit
Admission: RE | Admit: 2019-09-04 | Discharge: 2019-09-04 | Disposition: A | Discharge status: Home / Self Care | Payer: 59 | Source: Ambulatory Visit | Attending: Radiation Oncology | Admitting: Radiation Oncology

## 2019-09-04 DIAGNOSIS — C61 Malignant neoplasm of prostate: Secondary | ICD-10-CM | POA: Diagnosis not present

## 2019-09-05 ENCOUNTER — Other Ambulatory Visit: Payer: Self-pay

## 2019-09-05 ENCOUNTER — Ambulatory Visit
Admission: RE | Admit: 2019-09-05 | Discharge: 2019-09-05 | Disposition: A | Discharge status: Home / Self Care | Payer: 59 | Source: Ambulatory Visit | Attending: Radiation Oncology | Admitting: Radiation Oncology

## 2019-09-05 DIAGNOSIS — C61 Malignant neoplasm of prostate: Secondary | ICD-10-CM | POA: Diagnosis not present

## 2019-09-06 ENCOUNTER — Encounter: Payer: Self-pay | Admitting: Radiation Oncology

## 2019-09-06 ENCOUNTER — Other Ambulatory Visit: Payer: Self-pay

## 2019-09-06 ENCOUNTER — Ambulatory Visit
Admission: RE | Admit: 2019-09-06 | Discharge: 2019-09-06 | Disposition: A | Discharge status: Home / Self Care | Payer: 59 | Source: Ambulatory Visit | Attending: Radiation Oncology | Admitting: Radiation Oncology

## 2019-09-06 DIAGNOSIS — C61 Malignant neoplasm of prostate: Secondary | ICD-10-CM | POA: Diagnosis not present

## 2019-09-06 NOTE — Progress Notes (Signed)
  Radiation Oncology         [phone]) [phone] ________________________________  Name: Jonathan Austin MRN: [id number]  Date: 09/06/2019  DOB: 25-Apr-1965  VIRTUAL SIMULATION NOTE  NARRATIVE:  The patient underwent simulation today for ongoing radiation therapy.  The existing CT study set was employed for the purpose of virtual treatment planning.  The target and avoidance structures were reviewed and in some cases modified.  Treatment planning then occurred.  The radiation boost prescription was entered and confirmed. I have requested : Isodose Plan.   PLAN:  This modified radiation beam arrangement is intended to continue the current radiation dose to an additional 30 Gy in 15 fractions for a total cumulative dose of 2 Gy.  ------------------------------------------------  [name]. Tammi Klippel, M.D.

## 2019-09-09 ENCOUNTER — Ambulatory Visit: Payer: 59

## 2019-09-09 ENCOUNTER — Encounter: Payer: Self-pay | Admitting: Medical Oncology

## 2019-09-09 NOTE — Progress Notes (Signed)
Patient called stating he has a headache and is not feeling well. He will not be here today for radiation. I informed Linac 1.

## 2019-09-10 ENCOUNTER — Ambulatory Visit
Admission: RE | Admit: 2019-09-10 | Discharge: 2019-09-10 | Disposition: A | Discharge status: Home / Self Care | Payer: 59 | Source: Ambulatory Visit | Attending: Radiation Oncology | Admitting: Radiation Oncology

## 2019-09-10 ENCOUNTER — Other Ambulatory Visit: Payer: Self-pay

## 2019-09-10 DIAGNOSIS — R599 Enlarged lymph nodes, unspecified: Secondary | ICD-10-CM | POA: Diagnosis present

## 2019-09-10 DIAGNOSIS — C61 Malignant neoplasm of prostate: Secondary | ICD-10-CM | POA: Diagnosis not present

## 2019-09-10 DIAGNOSIS — Z510 Encounter for antineoplastic radiation therapy: Secondary | ICD-10-CM | POA: Insufficient documentation

## 2019-09-11 ENCOUNTER — Other Ambulatory Visit: Payer: Self-pay

## 2019-09-11 ENCOUNTER — Ambulatory Visit
Admission: RE | Admit: 2019-09-11 | Discharge: 2019-09-11 | Disposition: A | Discharge status: Home / Self Care | Payer: 59 | Source: Ambulatory Visit | Attending: Radiation Oncology | Admitting: Radiation Oncology

## 2019-09-11 DIAGNOSIS — C61 Malignant neoplasm of prostate: Secondary | ICD-10-CM | POA: Diagnosis not present

## 2019-09-12 ENCOUNTER — Ambulatory Visit
Admission: RE | Admit: 2019-09-12 | Discharge: 2019-09-12 | Disposition: A | Discharge status: Home / Self Care | Payer: 59 | Source: Ambulatory Visit | Attending: Radiation Oncology | Admitting: Radiation Oncology

## 2019-09-12 ENCOUNTER — Other Ambulatory Visit: Payer: Self-pay

## 2019-09-12 DIAGNOSIS — C61 Malignant neoplasm of prostate: Secondary | ICD-10-CM | POA: Diagnosis not present

## 2019-09-13 ENCOUNTER — Other Ambulatory Visit: Payer: Self-pay

## 2019-09-13 ENCOUNTER — Ambulatory Visit
Admission: RE | Admit: 2019-09-13 | Discharge: 2019-09-13 | Disposition: A | Discharge status: Home / Self Care | Payer: 59 | Source: Ambulatory Visit | Attending: Radiation Oncology | Admitting: Radiation Oncology

## 2019-09-13 DIAGNOSIS — C61 Malignant neoplasm of prostate: Secondary | ICD-10-CM | POA: Diagnosis not present

## 2019-09-16 ENCOUNTER — Ambulatory Visit: Payer: 59

## 2019-09-17 ENCOUNTER — Ambulatory Visit
Admission: RE | Admit: 2019-09-17 | Discharge: 2019-09-17 | Disposition: A | Discharge status: Home / Self Care | Payer: 59 | Source: Ambulatory Visit | Attending: Radiation Oncology | Admitting: Radiation Oncology

## 2019-09-17 ENCOUNTER — Other Ambulatory Visit: Payer: Self-pay

## 2019-09-17 DIAGNOSIS — C61 Malignant neoplasm of prostate: Secondary | ICD-10-CM | POA: Diagnosis not present

## 2019-09-18 ENCOUNTER — Ambulatory Visit
Admission: RE | Admit: 2019-09-18 | Discharge: 2019-09-18 | Disposition: A | Discharge status: Home / Self Care | Payer: 59 | Source: Ambulatory Visit | Attending: Radiation Oncology | Admitting: Radiation Oncology

## 2019-09-18 ENCOUNTER — Other Ambulatory Visit: Payer: Self-pay

## 2019-09-18 DIAGNOSIS — C61 Malignant neoplasm of prostate: Secondary | ICD-10-CM | POA: Diagnosis not present

## 2019-09-19 ENCOUNTER — Other Ambulatory Visit: Payer: Self-pay

## 2019-09-19 ENCOUNTER — Ambulatory Visit
Admission: RE | Admit: 2019-09-19 | Discharge: 2019-09-19 | Disposition: A | Discharge status: Home / Self Care | Payer: 59 | Source: Ambulatory Visit | Attending: Radiation Oncology | Admitting: Radiation Oncology

## 2019-09-19 DIAGNOSIS — C61 Malignant neoplasm of prostate: Secondary | ICD-10-CM | POA: Diagnosis not present

## 2019-09-20 ENCOUNTER — Other Ambulatory Visit: Payer: Self-pay

## 2019-09-20 ENCOUNTER — Ambulatory Visit: Payer: 59

## 2019-09-20 DIAGNOSIS — R45851 Suicidal ideations: Secondary | ICD-10-CM | POA: Insufficient documentation

## 2019-09-20 DIAGNOSIS — E1165 Type 2 diabetes mellitus with hyperglycemia: Secondary | ICD-10-CM | POA: Insufficient documentation

## 2019-09-20 DIAGNOSIS — I10 Essential (primary) hypertension: Secondary | ICD-10-CM | POA: Diagnosis not present

## 2019-09-20 DIAGNOSIS — C61 Malignant neoplasm of prostate: Secondary | ICD-10-CM | POA: Insufficient documentation

## 2019-09-20 DIAGNOSIS — F332 Major depressive disorder, recurrent severe without psychotic features: Secondary | ICD-10-CM | POA: Insufficient documentation

## 2019-09-20 DIAGNOSIS — Z20822 Contact with and (suspected) exposure to covid-19: Secondary | ICD-10-CM | POA: Insufficient documentation

## 2019-09-20 DIAGNOSIS — F1420 Cocaine dependence, uncomplicated: Secondary | ICD-10-CM | POA: Diagnosis not present

## 2019-09-21 ENCOUNTER — Emergency Department (HOSPITAL_COMMUNITY)
Admission: EM | Admit: 2019-09-21 | Discharge: 2019-09-23 | Disposition: A | Discharge status: Other Acute Inpatient Hospital | Payer: 59 | Attending: Emergency Medicine | Admitting: Emergency Medicine

## 2019-09-21 ENCOUNTER — Encounter (HOSPITAL_COMMUNITY): Payer: Self-pay | Admitting: Emergency Medicine

## 2019-09-21 DIAGNOSIS — R45851 Suicidal ideations: Secondary | ICD-10-CM

## 2019-09-21 DIAGNOSIS — F1420 Cocaine dependence, uncomplicated: Secondary | ICD-10-CM | POA: Diagnosis present

## 2019-09-21 DIAGNOSIS — F332 Major depressive disorder, recurrent severe without psychotic features: Secondary | ICD-10-CM | POA: Diagnosis present

## 2019-09-21 LAB — RAPID URINE DRUG SCREEN, HOSP PERFORMED
Amphetamines: NOT DETECTED
Barbiturates: NOT DETECTED
Benzodiazepines: NOT DETECTED
Cocaine: POSITIVE — AB
Opiates: NOT DETECTED
Tetrahydrocannabinol: NOT DETECTED

## 2019-09-21 LAB — COMPREHENSIVE METABOLIC PANEL
ALT: 17 U/L (ref 0–44)
AST: 12 U/L — ABNORMAL LOW (ref 15–41)
Albumin: 4.1 g/dL (ref 3.5–5.0)
Alkaline Phosphatase: 54 U/L (ref 38–126)
Anion gap: 10 (ref 5–15)
BUN: 12 mg/dL (ref 6–20)
CO2: 24 mmol/L (ref 22–32)
Calcium: 8.6 mg/dL — ABNORMAL LOW (ref 8.9–10.3)
Chloride: 95 mmol/L — ABNORMAL LOW (ref 98–111)
Creatinine, Ser: 0.95 mg/dL (ref 0.61–1.24)
GFR calc Af Amer: >60 mL/min (ref >60)
GFR calc non Af Amer: >60 mL/min (ref >60)
Glucose, Bld: 289 mg/dL — ABNORMAL HIGH (ref 70–99)
Potassium: 3.9 mmol/L (ref 3.5–5.1)
Sodium: 129 mmol/L — ABNORMAL LOW (ref 135–145)
Total Bilirubin: 0.9 mg/dL (ref 0.3–1.2)
Total Protein: 7.2 g/dL (ref 6.5–8.1)

## 2019-09-21 LAB — CBG MONITORING, ED
Glucose-Capillary: 281 mg/dL — ABNORMAL HIGH (ref 70–99)
Glucose-Capillary: 343 mg/dL — ABNORMAL HIGH (ref 70–99)
Glucose-Capillary: 188 mg/dL — ABNORMAL HIGH (ref 70–99)
Glucose-Capillary: 451 mg/dL — ABNORMAL HIGH (ref 70–99)

## 2019-09-21 LAB — CBC
HCT: 44.1 % (ref 39.0–52.0)
Hemoglobin: 15.2 g/dL (ref 13.0–17.0)
MCH: 30.4 pg (ref 26.0–34.0)
MCHC: 34.5 g/dL (ref 30.0–36.0)
MCV: 88.2 fL (ref 80.0–100.0)
Platelets: 221 10*3/uL (ref 150–400)
RBC: 5 MIL/uL (ref 4.22–5.81)
RDW: 12.6 % (ref 11.5–15.5)
WBC: 3.5 10*3/uL — ABNORMAL LOW (ref 4.0–10.5)
nRBC: 0 % (ref 0.0–0.2)

## 2019-09-21 LAB — URINALYSIS, ROUTINE W REFLEX MICROSCOPIC
Bilirubin Urine: NEGATIVE
Glucose, UA: >=500 mg/dL — AB
Ketones, ur: 80 mg/dL — AB
Leukocytes,Ua: NEGATIVE
Nitrite: NEGATIVE
Protein, ur: 100 mg/dL — AB
Specific Gravity, Urine: 1.039 — ABNORMAL HIGH (ref 1.005–1.030)
pH: 6 (ref 5.0–8.0)

## 2019-09-21 LAB — SALICYLATE LEVEL: Salicylate Lvl: <7 mg/dL — ABNORMAL LOW (ref 7.0–30.0)

## 2019-09-21 LAB — ACETAMINOPHEN LEVEL: Acetaminophen (Tylenol), Serum: <10 ug/mL — ABNORMAL LOW (ref 10–30)

## 2019-09-21 LAB — ETHANOL: Alcohol, Ethyl (B): <10 mg/dL (ref <10)

## 2019-09-21 LAB — SARS CORONAVIRUS 2 BY RT PCR (HOSPITAL ORDER, PERFORMED IN ~~LOC~~ HOSPITAL LAB): SARS Coronavirus 2: NEGATIVE

## 2019-09-21 MED ORDER — ALUM & MAG HYDROXIDE-SIMETH 200-200-20 MG/5ML PO SUSP: 30.0000 mL | Freq: Four times a day (QID) | ORAL | Status: DC | PRN

## 2019-09-21 MED ORDER — ABIRATERONE ACETATE 250 MG PO TABS
1000.0000 mg | ORAL_TABLET | Freq: Every day | ORAL | Status: DC
  Administered 2019-09-23: 1000 mg via ORAL

## 2019-09-21 MED ORDER — INSULIN ASPART 100 UNIT/ML ~~LOC~~ SOLN
0.0000 [IU] | Freq: Three times a day (TID) | SUBCUTANEOUS | Status: DC
  Administered 2019-09-21: 3 [IU] via SUBCUTANEOUS
  Administered 2019-09-21: 11 [IU] via SUBCUTANEOUS
  Administered 2019-09-21: 8 [IU] via SUBCUTANEOUS
  Administered 2019-09-22: 15 [IU] via SUBCUTANEOUS
  Administered 2019-09-22: 11 [IU] via SUBCUTANEOUS
  Administered 2019-09-22: 3 [IU] via SUBCUTANEOUS
  Administered 2019-09-23 (×2): 11 [IU] via SUBCUTANEOUS
  Administered 2019-09-23: 5 [IU] via SUBCUTANEOUS
  Filled 2019-09-21: qty 0.15

## 2019-09-21 MED ORDER — ACETAMINOPHEN 325 MG PO TABS: 650.0000 mg | ORAL_TABLET | ORAL | Status: DC | PRN

## 2019-09-21 NOTE — ED Triage Notes (Signed)
Patient arrives SI. Patient seen before for same. Patient states his health is just too much and he is tired of fighting.

## 2019-09-21 NOTE — Progress Notes (Signed)
RT unavailable, with critical patients.

## 2019-09-21 NOTE — Progress Notes (Signed)
Per Waylan Boga, DNP this patient meets inpatient criteria - CSW faxed the patient's clinical information out to the following facilities for review:   -[place], MSW, LCSW-A Transitions of Care  Clinical Social Worker  [facility] Departments  Medical ICU [phone]

## 2019-09-21 NOTE — ED Provider Notes (Signed)
Emergency Medicine Observation Re-evaluation Note  Jonathan Austin is a 54 y.o. male, seen on rounds today.  Pt initially presented to the ED for complaints of Suicidal Currently, the patient is Medically stable.  Physical Exam  BP (!) 133/96   Pulse 97   Temp 98 F (36.7 C) (Oral)   Resp 16   SpO2 95%  Physical Exam General: Well-nourished well-developed Respiratory: No stridor, breathing without difficulty Musculoskeletal: No deformities no edema Neuro/psych: Patient is sleeping this morning, calm ED Course / MDM  EKG:    I have reviewed the labs performed to date as well as medications administered while in observation.  Patient's labs returned.  Positive for cocaine this fall as hyponatremia.  Patient does have history of hyponatremia and this is not significantly changed from baseline. Plan  Current plan is for TTS assessment to determine disposition.    [name], MD 09/21/19 [phone]

## 2019-09-21 NOTE — BH Assessment (Signed)
Case was staffed with Reita Cliche DNP who recommended a inpatient admission for stabilization.

## 2019-09-21 NOTE — BH Assessment (Signed)
Comprehensive Clinical Assessment (CCA) Screening, Triage and Referral Note  09/21/2019 Jonathan Austin [id number] Patient presents this date voluntary with ongoing S/I. Patient reports multiple plans to include: driving his care off the road, overdosing or cutting himself. Patient denies any H/I or AVH. Patient has a history of prostate cancer and was recently started on antibiotics for a urinary tract infection. Patient states his primary stressor is associated with his health although he is "very frustrated with himself" over ongoing addiction issues. Patient reports he has been using crack cocaine in various amounts for the last two months and that he has went from two to three times a week to daily use in the last month. Patient reports last use was earlier this date when he reported using 1 gram. Patient also reports ongoing ETOH use stating he consumes 6 to 12 beers three to four times a week with last use earlier this date when he reports he "had a couple beers." Patient denies any current withdrawals. Patient states he was diagnosed with depression over six months ago and was last seen per chart review on 08/25/19 when he presented with similar symptoms. Patient did not meet inpatient criteria at that time and was discharged on 08/26/19. Patient was instructed to follow up with [place] although failed to do so as his SA use patterns intensified. Per notes, Patient states that he has been suicidal on two other occasions in the past and he states that he was last hospitalized at [facility] in either October or December of last year.   HPI Jonathan Austin is a 54 y.o. male with PMH reviewed below presents to the ED with worsening depression and suicidal thinking.  Patient has history of prostate cancer and was recently started on antibiotics for urinary tract infection.  He cannot recall the name of the antibiotics but has not had any complications thus far.  He feels like multiple issues are  increasing his stress and depression.  Has had thoughts of suicide intermittently over the past couple of months but these thoughts have worsened in the last 24 hours.  He is thought about driving his car off the road or over a bridge in order to harm himself.  He is also thought of cutting himself.  He denies any homicidal ideation. He is not having any auditory or visual hallucinations.  Patient is oriented and presents with a depressed affect. Patient's memory is intact and thoughts organized. Patient does not appear to be responding to internal stimuli. Case was staffed with Reita Cliche DNP who recommended a inpatient admission for stabilization.     Visit Diagnosis: MDD recurrent without psychotic features, severe, cocaine use, alcohol abuse     ICD-10-CM   1. Suicidal ideation  R45.851     Patient Reported Information How did you hear about [place]? Self   Referral name: [name]   Referral phone number: No data recorded Whom do you see for routine medical problems? I don't have a doctor   Practice/Facility Name: No data recorded  Practice/Facility Phone Number: No data recorded  Name of Contact: No data recorded  Contact Number: No data recorded  Contact Fax Number: No data recorded  Prescriber Name: No data recorded  Prescriber Address (if known): No data recorded What Is the Reason for Your Visit/Call Today? S/I associated with ongoing health issues  How Long Has This Been Causing You Problems? 1-6 months  Have You Recently Been in Any Inpatient Treatment (Hospital/Detox/Crisis Center/28-Day Program)? No  Name/Location of Program/Hospital:No data recorded  How Long Were You There? No data recorded  When Were You Discharged? No data recorded Have You Ever Received Services From [facility] Before? Yes   Who Do You See at [facility]? Patient was assessed by TTS on 06/14/19 when he presented with similar symptoms.  Have You Recently Had Any Thoughts About Hurting Yourself?  Yes   Are You Planning to Commit Suicide/Harm Yourself At This time?  Yes  Have you Recently Had Thoughts About Hurting Someone [place]? No   Explanation: No data recorded Have You Used Any Alcohol or Drugs in the Past 24 Hours? Yes   How Long Ago Did You Use Drugs or Alcohol?  0300   What Did You Use and How Much? 1 gram of cocaine (crack)  What Do You Feel Would Help You the Most Today? Therapy;Medication  Do You Currently Have a Therapist/Psychiatrist? No   Name of Therapist/Psychiatrist: No data recorded  Have You Been Recently Discharged From Any Office Practice or Programs? No   Explanation of Discharge From Practice/Program:  No data recorded    CCA Screening Triage Referral Assessment Type of Contact: Face-to-Face   Is this Initial or Reassessment? Initial Assessment   Date Telepsych consult ordered in [facility]:  09/21/19   Time Telepsych consult ordered in [facility]:  0800  Patient Reported Information Reviewed? Yes   Patient Left Without Being Seen? No data recorded  Reason for Not Completing Assessment: No data recorded Collateral Involvement: None at this time  Does Patient Have a Court Appointed Legal Guardian? No data recorded  Name and Contact of Legal Guardian:  No data recorded If Minor and Not Living with Parent(s), Who has Custody? No data recorded Is CPS involved or ever been involved? Never  Is APS involved or ever been involved? Never  Patient Determined To Be At Risk for Harm To Self or Others Based on Review of Patient Reported Information or Presenting Complaint? Yes, for Self-Harm   Method: No data recorded  Availability of Means: No data recorded  Intent: No data recorded  Notification Required: No data recorded  Additional Information for Danger to Others Potential:  No data recorded  Additional Comments for Danger to Others Potential:  No data recorded  Are There Guns or Other Weapons in Your Home?  No    Types of Guns/Weapons: No data  recorded   Are These Weapons Safely Secured?                              No data recorded   Who Could Verify You Are Able To Have These Secured:    No data recorded Do You Have any Outstanding Charges, Pending Court Dates, Parole/Probation? No data recorded Contacted To Inform of Risk of Harm To Self or Others: Other: Comment (NA)  Location of Assessment: [facility] ED  Does Patient Present under Involuntary Commitment? No   IVC Papers Initial File Date: No data recorded  [place] of Residence: [place]  Patient Currently Receiving the Following Services: Not Receiving Services   Determination of Need: No data recorded  Options For Referral: Outpatient Therapy Case was staffed with Reita Cliche DNP who recommended a inpatient admission for stabilization.      Mamie Nick, LCAS

## 2019-09-21 NOTE — ED Provider Notes (Signed)
Emergency Department Provider Note   I have reviewed the triage vital signs and the nursing notes.   HISTORY  Chief Complaint Suicidal   HPI Jonathan Austin is a 54 y.o. male with PMH reviewed below presents to the ED with worsening depression and suicidal thinking.  Patient has history of prostate cancer and was recently started on antibiotics for urinary tract infection.  He cannot recall the name of the antibiotics but has not had any complications thus far.  He feels like multiple issues are increasing his stress and depression.  Has had thoughts of suicide intermittently over the past couple of months but these thoughts have worsened in the last 24 hours.  He is thought about driving his car off the road or over a bridge in order to harm himself.  He is also thought of cutting himself.  He denies any homicidal ideation.  He is not having any auditory or visual hallucinations.  Past Medical History:  Diagnosis Date  . Alcohol use disorder, severe, dependence ([place])    05-24-2019 recent [facility] admission for suicide ideation and alcohol / cocaine detox 05-04-2019 in epic,  pt stated has been taking naltrexone daily as prescribed since and no alcohol since  . Cocaine abuse ([place])    05-24-2019  pt had recent [facility] admission, pt stated last used  "crack" 05-03-2019  . Cocaine abuse with cocaine-induced mood disorder ([place])   . Depression   . History of suicidal ideation    multiple [place] admission's;  last one admitted 05-04-2019  . Hyperplasia of prostate with lower urinary tract symptoms (LUTS)   . Hypertension    followed by pcp  . OSA on CPAP    study 10-02-2012 in epic  . Phimosis   . Prostate cancer [facility]) urologist-- dr winter   dx 02/ 2021;  localized advanced, gleason 4+4, PSA 317  . Type 2 diabetes mellitus treated with insulin ([place])    followed by pcp   (05-24-2019  per stated checks blood sugar twice daily, fasting sugar-- 180-200)  . Wears glasses     Patient Active Problem  List   Diagnosis Date Noted  . MDD (major depressive disorder) 05/04/2019  . Malignant neoplasm of prostate ([place]) 04/30/2019  . MDD (major depressive disorder), recurrent episode, severe ([place]) 01/24/2018  . Polysubstance dependence including opioid drug with daily use ([place]) 03/16/2017  . MDD (major depressive disorder), severe ([place]) 03/15/2017  . Cocaine use disorder, severe, dependence ([place]) 07/19/2016  . Major depressive disorder, recurrent episode ([place]) 07/16/2016  . Alcohol use disorder, severe, dependence ([place]) 07/16/2016  . Thoracic aortic atherosclerosis ([place]) 05/09/2016  . Influenza B 05/09/2016  . Dehydration   . Lactic acidosis   . Major depressive disorder, recurrent severe without psychotic features ([place]) 02/21/2016  . Renal insufficiency 01/20/2016  . HTN (hypertension) 09/20/2015  . HLD (hyperlipidemia) 09/20/2015  . Substance abuse ([place]) 08/30/2014  . Substance induced mood disorder ([place]) 05/07/2014  . Cocaine abuse with cocaine-induced mood disorder ([place])   . Alcohol abuse   . Sinus tachycardia 02/12/2014  . Cellulitis 06/28/2013  . Uncontrolled diabetes mellitus with hyperglycemia ([place]) 06/28/2013  . Marijuana abuse 06/28/2013  . Diabetes mellitus type II, uncontrolled ([place]) 12/09/2012    Past Surgical History:  Procedure Laterality Date  . CIRCUMCISION N/A 05/29/2019   Procedure: CIRCUMCISION ADULT;  Surgeon: Ceasar Mons, MD;  Location[facility]  Service: Urology;  Laterality: N/A;  . PROSTATE BIOPSY    . TONSILLECTOMY  child  .  TRANSURETHRAL RESECTION OF PROSTATE N/A 05/29/2019   Procedure: TRANSURETHRAL RESECTION OF THE PROSTATE (TURP)/ CYSTOSCOPY;  Surgeon: Ceasar Mons, MD;  Location[facility];  Service: Urology;  Laterality: N/A;    Allergies Patient has no known allergies.  Family History  Problem Relation Age of Onset  . Hypertension Mother   . Hypertension Father   . Diabetes Father     . Prostate cancer Maternal Grandfather 96  . Breast cancer Neg Hx   . Colon cancer Neg Hx   . Pancreatic cancer Neg Hx     Social History Social History   Tobacco Use  . Smoking status: Never Smoker  . Smokeless tobacco: Never Used  Vaping Use  . Vaping Use: Never used  Substance Use Topics  . Alcohol use: Yes    Comment: drinks beer 40 oz x 2 daily  (05-24-2019 per pt no alcohol since 05-03-2019)  . Drug use: Yes    Types: Cocaine, Marijuana    Comment: smokes 2g crack daily  (05-24-2019 per pt last crack and marijuana 05-03-2019)    Review of Systems  Constitutional: No fever/chills Eyes: No visual changes. ENT: No sore throat. Cardiovascular: Denies chest pain. Respiratory: Denies shortness of breath. Gastrointestinal: No abdominal pain.  No nausea, no vomiting.  No diarrhea.  No constipation. Genitourinary: Negative for dysuria. Musculoskeletal: Negative for back pain. Skin: Negative for rash. Neurological: Negative for headaches, focal weakness or numbness. Psychiatric: Positive SI. Denies HI, AH, VH.   10-point ROS otherwise negative.  ____________________________________________   PHYSICAL EXAM:  VITAL SIGNS: ED Triage Vitals  Enc Vitals Group     BP 09/21/19 0122 (!) 149/85     Pulse Rate 09/21/19 0122 (!) 51     Resp 09/21/19 0122 16     Temp 09/21/19 0122 97.8 F (36.6 C)     Temp Source 09/21/19 0122 Oral     SpO2 09/21/19 0122 99 %   Constitutional: Alert and oriented. Well appearing and in no acute distress. Eyes: Conjunctivae are normal.  Head: Atraumatic. Nose: No congestion/rhinnorhea. Mouth/Throat: Mucous membranes are moist.  Neck: No stridor.   Cardiovascular: Normal rate, regular rhythm. Good peripheral circulation. Grossly normal heart sounds.   Respiratory: Normal respiratory effort.  No retractions. Lungs CTAB. Gastrointestinal: Soft and nontender. No distention.  Musculoskeletal: No gross deformities of extremities. Neurologic:   Normal speech and language.  Skin:  Skin is warm, dry and intact. No rash noted. Psychiatric: Mood and affect are normal. Speech and behavior are normal.  ____________________________________________   LABS (all labs ordered are listed, but only abnormal results are displayed)  Labs Reviewed  COMPREHENSIVE METABOLIC PANEL - Abnormal; Notable for the following components:      Result Value   Sodium 129 (*)    Chloride 95 (*)    Glucose, Bld 289 (*)    Calcium 8.6 (*)    AST 12 (*)    All other components within normal limits  SALICYLATE LEVEL - Abnormal; Notable for the following components:   Salicylate Lvl <6.1 (*)    All other components within normal limits  ACETAMINOPHEN LEVEL - Abnormal; Notable for the following components:   Acetaminophen (Tylenol), Serum <10 (*)    All other components within normal limits  CBC - Abnormal; Notable for the following components:   WBC 3.5 (*)    All other components within normal limits  RAPID URINE DRUG SCREEN, HOSP PERFORMED - Abnormal; Notable for the following components:   Cocaine  POSITIVE (*)    All other components within normal limits  URINALYSIS, ROUTINE W REFLEX MICROSCOPIC - Abnormal; Notable for the following components:   Specific Gravity, Urine 1.039 (*)    Glucose, UA >=500 (*)    Hgb urine dipstick MODERATE (*)    Ketones, ur 80 (*)    Protein, ur 100 (*)    Bacteria, UA RARE (*)    All other components within normal limits  URINE CULTURE  SARS CORONAVIRUS 2 BY RT PCR (HOSPITAL ORDER, [place] [facility])  ETHANOL   ____________________________________________   PROCEDURES  Procedure(s) performed:   Procedures  None  ____________________________________________   INITIAL IMPRESSION / ASSESSMENT AND PLAN / ED COURSE  Pertinent labs & imaging results that were available during my care of the patient were reviewed by me and considered in my medical decision making (see chart for  details).   Patient presents emergency department for evaluation of suicidal ideation and worsening depression.  No medical complaints at this time but has multiple medical issues ongoing currently.  He is under going treatment for urinary tract infection but cannot recall the name of the antibiotic.  I have ordered basic psych labs along with UA and culture.   Labs reviewed. Asymptomatic hyponatremia noted on labs. Outpatient evaluation. No indication for medical admit. Patient not taking most home meds. Re-ordered meds and will cover with sliding scale insulin. No evidence of UTI here. Patient cannot recall the name of the abx he is currently taking. May call wife in the AM to discuss and can re-start then.   Patient medically clear for TTS evaluation. COVID ordered. No symptoms. Patient is voluntary.  ____________________________________________  FINAL CLINICAL IMPRESSION(S) / ED DIAGNOSES  Final diagnoses:  Suicidal ideation    MEDICATIONS GIVEN DURING THIS VISIT:  Medications  acetaminophen (TYLENOL) tablet 650 mg (has no administration in time range)  alum & mag hydroxide-simeth (MAALOX/MYLANTA) 200-200-20 MG/5ML suspension 30 mL (has no administration in time range)  abiraterone acetate (ZYTIGA) tablet 1,000 mg (has no administration in time range)  insulin aspart (novoLOG) injection 0-15 Units (has no administration in time range)    Note:  This document was prepared using Dragon voice recognition software and may include unintentional dictation errors.  Nanda Quinton, MD, [facility] Emergency Medicine    Valoria Tamburri, [facility], MD 09/21/19 [phone]

## 2019-09-22 DIAGNOSIS — F1420 Cocaine dependence, uncomplicated: Secondary | ICD-10-CM

## 2019-09-22 DIAGNOSIS — F332 Major depressive disorder, recurrent severe without psychotic features: Secondary | ICD-10-CM

## 2019-09-22 LAB — URINE CULTURE: Culture: <10000 — AB

## 2019-09-22 LAB — CBG MONITORING, ED
Glucose-Capillary: 429 mg/dL — ABNORMAL HIGH (ref 70–99)
Glucose-Capillary: 341 mg/dL — ABNORMAL HIGH (ref 70–99)
Glucose-Capillary: 174 mg/dL — ABNORMAL HIGH (ref 70–99)

## 2019-09-22 MED ORDER — HYDROXYZINE HCL 25 MG PO TABS: 25.0000 mg | ORAL_TABLET | Freq: Four times a day (QID) | ORAL | Status: DC | PRN

## 2019-09-22 MED ORDER — ESCITALOPRAM OXALATE 10 MG PO TABS
5.0000 mg | ORAL_TABLET | Freq: Every day | ORAL | Status: DC
  Administered 2019-09-22 – 2019-09-23 (×2): 5 mg via ORAL
  Filled 2019-09-22 (×2): qty 1

## 2019-09-22 NOTE — Progress Notes (Signed)
Pt. Has home CPAP device and will self place for sleep this shift.

## 2019-09-22 NOTE — Consult Note (Signed)
[facility] Face-to-Face Psychiatry Consult   Reason for Consult:  Suicidal ideations Referring Physician:  EDP Patient Identification: Jonathan Austin MRN:  [id number] Principal Diagnosis: Major depressive disorder, recurrent severe without psychotic features ([place]) Diagnosis:  Principal Problem:   Major depressive disorder, recurrent severe without psychotic features ([place]) Active Problems:   Cocaine use disorder, severe, dependence ([place])   Total Time spent with patient: 45 minutes  Subjective:   Jonathan Austin is a 54 y.o. male patient admitted with suicide threat.  Patient seen and evaluated in person by this provider.  He continues to endorse a high level of depression related to his health issues specifically prostate cancer and relapse on alcohol and cocaine.  Continues to endorse suicidal ideations to overdose.  Positive for cocaine on admission.  Endorses feelings of hopelessness, helplessness, and worthlessness.  His depression increases with substance use and decreases with medications.  Depression is higher in the evening and associated also with anxiety.  Sleep is fair to poor and appetite is "normal".  Inpatient psychiatric hospitalization recommended as this is his second time in the past month with the presentation of the same.  HPI per [name]: Patient presents this date voluntary with ongoing S/I. Patient reports multiple plans to include: driving his care off the road, overdosing or cutting himself. Patient denies any H/I or AVH. Patient has a history of prostate cancer and was recently started on antibiotics for a urinary tract infection. Patient states his primary stressor is associated with his health although he is "very frustrated with himself" over ongoing addiction issues. Patient reports he has been using crack cocaine in various amounts for the last two months and that he has went from two to three times a week to daily use in the last month. Patient reports last  use was earlier this date when he reported using 1 gram. Patient also reports ongoing ETOH use stating he consumes 6 to 12 beers three to four times a week with last use earlier this date when he reports he "had a couple beers." Patient denies any current withdrawals. Patient states he was diagnosed with depression over six months ago and was last seen per chart review on 08/25/19 when he presented with similar symptoms. Patient did not meet inpatient criteria at that time and was discharged on 08/26/19. Patient was instructed to follow up with [facility] although failed to do so as his SA use patterns intensified. Per notes, Patient states that he has been suicidal on two other occasions in the past and he states that he was last hospitalized at [facility] in either October or December of last year.   HPI Jonathan Austin a 54 y.o.malewith PMH reviewed below presents to the ED with worseningdepression and suicidal thinking. Patient has history of prostate cancer and was recently started on antibiotics for urinary tract infection. He cannot recall the name of the antibiotics but has not had any complications thus far. He feels like multiple issues are increasing his stress and depression. Has had thoughts of suicide intermittently over the past couple of months but these thoughts have worsened in the last 24 hours. He is thought about driving his car off the road or over a bridge in order to harm himself. He is also thought of cutting himself.He denies any homicidal ideation. He is not having any auditory or visual hallucinations.  Patient is oriented and presents with a depressed affect. Patient's memory is intact and thoughts organized. Patient does not appear to be  responding to internal stimuli. Case was staffed with Reita Cliche DNP who recommended a inpatient admission for stabilization.     Past Psychiatric History: depression, polysubstance use  Risk to Self:  yes Risk to Others:   none Prior Inpatient Therapy:  yes Prior Outpatient Therapy:  yes  Past Medical History:  Past Medical History:  Diagnosis Date  . Alcohol use disorder, severe, dependence ([place])    05-24-2019 recent [facility] admission for suicide ideation and alcohol / cocaine detox 05-04-2019 in epic,  pt stated has been taking naltrexone daily as prescribed since and no alcohol since  . Cocaine abuse ([place])    05-24-2019  pt had recent [facility] admission, pt stated last used  "crack" 05-03-2019  . Cocaine abuse with cocaine-induced mood disorder ([place])   . Depression   . History of suicidal ideation    multiple [facility] admission's;  last one admitted 05-04-2019  . Hyperplasia of prostate with lower urinary tract symptoms (LUTS)   . Hypertension    followed by pcp  . OSA on CPAP    study 10-02-2012 in epic  . Phimosis   . Prostate cancer [facility]) urologist-- dr winter   dx 02/ 2021;  localized advanced, gleason 4+4, PSA 317  . Type 2 diabetes mellitus treated with insulin ([place])    followed by pcp   (05-24-2019  per stated checks blood sugar twice daily, fasting sugar-- 180-200)  . Wears glasses     Past Surgical History:  Procedure Laterality Date  . CIRCUMCISION N/A 05/29/2019   Procedure: CIRCUMCISION ADULT;  Surgeon: Ceasar Mons, MD;  Location: [facility];  Service: Urology;  Laterality: N/A;  . PROSTATE BIOPSY    . TONSILLECTOMY  child  . TRANSURETHRAL RESECTION OF PROSTATE N/A 05/29/2019   Procedure: TRANSURETHRAL RESECTION OF THE PROSTATE (TURP)/ CYSTOSCOPY;  Surgeon: Ceasar Mons, MD;  Location: [facility];  Service: Urology;  Laterality: N/A;   Family History:  Family History  Problem Relation Age of Onset  . Hypertension Mother   . Hypertension Father   . Diabetes Father   . Prostate cancer Maternal Grandfather 96  . Breast cancer Neg Hx   . Colon cancer Neg Hx   . Pancreatic cancer Neg Hx    Family Psychiatric  History: none Social  History:  Social History   Substance and Sexual Activity  Alcohol Use Yes   Comment: drinks beer 40 oz x 2 daily  (05-24-2019 per pt no alcohol since 05-03-2019)     Social History   Substance and Sexual Activity  Drug Use Yes  . Types: Cocaine, Marijuana   Comment: smokes 2g crack daily  (05-24-2019 per pt last crack and marijuana 05-03-2019)    Social History   Socioeconomic History  . Marital status: Married    Spouse name: Doris  . Number of children: 1  . Years of education: Not on file  . Highest education level: Not on file  Occupational History  . Not on file  Tobacco Use  . Smoking status: Never Smoker  . Smokeless tobacco: Never Used  Vaping Use  . Vaping Use: Never used  Substance and Sexual Activity  . Alcohol use: Yes    Comment: drinks beer 40 oz x 2 daily  (05-24-2019 per pt no alcohol since 05-03-2019)  . Drug use: Yes    Types: Cocaine, Marijuana    Comment: smokes 2g crack daily  (05-24-2019 per pt last crack and marijuana 05-03-2019)  . Sexual activity:  Yes  Other Topics Concern  . Not on file  Social History Narrative   Only child is deceased   Social Determinants of Radio broadcast assistant Strain:   . Difficulty of Paying Living Expenses:   Food Insecurity:   . Worried About Charity fundraiser in the Last Year:   . Arboriculturist in the Last Year:   Transportation Needs:   . Film/video editor (Medical):   Marland Kitchen Lack of Transportation (Non-Medical):   Physical Activity:   . Days of Exercise per Week:   . Minutes of Exercise per Session:   Stress:   . Feeling of Stress :   Social Connections:   . Frequency of Communication with Friends and Family:   . Frequency of Social Gatherings with Friends and Family:   . Attends Religious Services:   . Active Member of Clubs or Organizations:   . Attends Archivist Meetings:   Marland Kitchen Marital Status:    Additional Social History:    Allergies:  No Known Allergies  Labs:  Results  for orders placed or performed during the hospital encounter of 09/21/19 (from the past 48 hour(s))  Rapid urine drug screen (hospital performed)     Status: Abnormal   Collection Time: 09/21/19  1:32 AM  Result Value Ref Range   Opiates NONE DETECTED NONE DETECTED   Cocaine POSITIVE (A) NONE DETECTED   Benzodiazepines NONE DETECTED NONE DETECTED   Amphetamines NONE DETECTED NONE DETECTED   Tetrahydrocannabinol NONE DETECTED NONE DETECTED   Barbiturates NONE DETECTED NONE DETECTED    Comment: (NOTE) DRUG SCREEN FOR MEDICAL PURPOSES ONLY.  IF CONFIRMATION IS NEEDED FOR ANY PURPOSE, NOTIFY LAB WITHIN 5 DAYS.  LOWEST DETECTABLE LIMITS FOR URINE DRUG SCREEN Drug Class                     Cutoff (ng/mL) Amphetamine and metabolites    1000 Barbiturate and metabolites    200 Benzodiazepine                 161 Tricyclics and metabolites     300 Opiates and metabolites        300 Cocaine and metabolites        300 THC                            50 Performed at [facility], [place] [address], [place], [place] [address]   Urinalysis, Routine w reflex microscopic Urine, Clean Catch     Status: Abnormal   Collection Time: 09/21/19  1:32 AM  Result Value Ref Range   Color, Urine YELLOW YELLOW   APPearance CLEAR CLEAR   Specific Gravity, Urine 1.039 (H) 1.005 - 1.030   pH 6.0 5.0 - 8.0   Glucose, UA >=500 (A) NEGATIVE mg/dL   Hgb urine dipstick MODERATE (A) NEGATIVE   Bilirubin Urine NEGATIVE NEGATIVE   Ketones, ur 80 (A) NEGATIVE mg/dL   Protein, ur 100 (A) NEGATIVE mg/dL   Nitrite NEGATIVE NEGATIVE   Leukocytes,Ua NEGATIVE NEGATIVE   RBC / HPF 21-50 0 - 5 RBC/hpf   WBC, UA 11-20 0 - 5 WBC/hpf   Bacteria, UA RARE (A) NONE SEEN   Squamous Epithelial / LPF 0-5 0 - 5   Mucus PRESENT    Sperm, UA PRESENT     Comment: Performed at [facility], [place] [address] [place], [place]  [address]  Urine culture     Status: Abnormal   Collection Time: 09/21/19   1:32 AM   Specimen: Urine, Random  Result Value Ref Range   Specimen Description      URINE, RANDOM Performed at [facility], [place] [address] [place], [place] [address]    Special Requests      NONE Performed at [facility], [place] [address] [place], [place] [address]    Culture (A)     <10,000 COLONIES/mL INSIGNIFICANT GROWTH Performed at [place] [address], [place], [place] [address]    Report Status 09/22/2019 FINAL   Comprehensive metabolic panel     Status: Abnormal   Collection Time: 09/21/19  5:04 AM  Result Value Ref Range   Sodium 129 (L) 135 - 145 mmol/L   Potassium 3.9 3.5 - 5.1 mmol/L   Chloride 95 (L) 98 - 111 mmol/L   CO2 24 22 - 32 mmol/L   Glucose, Bld 289 (H) 70 - 99 mg/dL    Comment: Glucose reference range applies only to samples taken after fasting for at least 8 hours.   BUN 12 6 - 20 mg/dL   Creatinine, Ser 0.95 0.61 - 1.24 mg/dL   Calcium 8.6 (L) 8.9 - 10.3 mg/dL   Total Protein 7.2 6.5 - 8.1 g/dL   Albumin 4.1 3.5 - 5.0 g/dL   AST 12 (L) 15 - 41 U/L   ALT 17 0 - 44 U/L   Alkaline Phosphatase 54 38 - 126 U/L   Total Bilirubin 0.9 0.3 - 1.2 mg/dL   GFR calc non Af Amer >60 >60 mL/min   GFR calc Af Amer >60 >60 mL/min   Anion gap 10 5 - 15    Comment: Performed at [facility], [place] [address] [place], [place] [address]  Ethanol     Status: None   Collection Time: 09/21/19  5:04 AM  Result Value Ref Range   Alcohol, Ethyl (B) <10 <10 mg/dL    Comment: (NOTE) Lowest detectable limit for serum alcohol is 10 mg/dL.  For medical purposes only. Performed at [facility], [place] [name], [place], [place] [address]   Salicylate level     Status: Abnormal   Collection Time: 09/21/19  5:04 AM  Result Value Ref Range   Salicylate Lvl <0.6 (L) 7.0 - 30.0 mg/dL    Comment: Performed at [facility], [place] [address], [place], [place] [address]   Acetaminophen level     Status: Abnormal   Collection Time: 09/21/19  5:04 AM  Result Value Ref Range   Acetaminophen (Tylenol), Serum <10 (L) 10 - 30 ug/mL    Comment: (NOTE) Therapeutic concentrations vary significantly. A range of 10-30 ug/mL  may be an effective concentration for many patients. However, some  are best treated at concentrations outside of this range. Acetaminophen concentrations >150 ug/mL at 4 hours after ingestion  and >50 ug/mL at 12 hours after ingestion are often associated with  toxic reactions.  Performed at [facility], [place] [address], [place],  [address]   cbc     Status: Abnormal   Collection Time: 09/21/19  5:04 AM  Result Value Ref Range   WBC 3.5 (L) 4.0 - 10.5 K/uL   RBC 5.00 4.22 - 5.81 MIL/uL   Hemoglobin 15.2 13.0 - 17.0 g/dL   HCT 44.1 39 - 52 %   MCV 88.2 80.0 - 100.0  fL   MCH 30.4 26.0 - 34.0 pg   MCHC 34.5 30.0 - 36.0 g/dL   RDW 12.6 11.5 - 15.5 %   Platelets 221 150 - 400 K/uL   nRBC 0.0 0.0 - 0.2 %    Comment: Performed at [facility], [place] [address], [place], [place] [address]  SARS Coronavirus 2 by RT PCR (hospital order, performed in [facility] hospital lab) Nasopharyngeal Nasopharyngeal Swab     Status: None   Collection Time: 09/21/19  6:16 AM   Specimen: Nasopharyngeal Swab  Result Value Ref Range   SARS Coronavirus 2 NEGATIVE NEGATIVE    Comment: (NOTE) SARS-CoV-2 target nucleic acids are NOT DETECTED.  The SARS-CoV-2 RNA is generally detectable in upper and lower respiratory specimens during the acute phase of infection. The lowest concentration of SARS-CoV-2 viral copies this assay can detect is 250 copies / mL. A negative result does not preclude SARS-CoV-2 infection and should not be used as the sole basis for treatment or other patient management decisions.  A negative result may occur with improper specimen collection / handling, submission of specimen other than  nasopharyngeal swab, presence of viral mutation(s) within the areas targeted by this assay, and inadequate number of viral copies (<250 copies / mL). A negative result must be combined with clinical observations, patient history, and epidemiological information.  Fact Sheet for Patients:   StrictlyIdeas.no  Fact Sheet for Healthcare Providers: BankingDealers.co.za  This test is not yet approved or  cleared by the [place] FDA and has been authorized for detection and/or diagnosis of SARS-CoV-2 by FDA under an Emergency Use Authorization (EUA).  This EUA will remain in effect (meaning this test can be used) for the duration of the COVID-19 declaration under Section 564(b)(1) of the Act, 21 U.S.C. section 360bbb-3(b)(1), unless the authorization is terminated or revoked sooner.  Performed at [facility], [place] [address], [place], [place] [address]   CBG monitoring, ED     Status: Abnormal   Collection Time: 09/21/19  7:59 AM  Result Value Ref Range   Glucose-Capillary 281 (H) 70 - 99 mg/dL    Comment: Glucose reference range applies only to samples taken after fasting for at least 8 hours.  CBG monitoring, ED     Status: Abnormal   Collection Time: 09/21/19 12:31 PM  Result Value Ref Range   Glucose-Capillary 343 (H) 70 - 99 mg/dL    Comment: Glucose reference range applies only to samples taken after fasting for at least 8 hours.  CBG monitoring, ED     Status: Abnormal   Collection Time: 09/21/19  4:36 PM  Result Value Ref Range   Glucose-Capillary 188 (H) 70 - 99 mg/dL    Comment: Glucose reference range applies only to samples taken after fasting for at least 8 hours.  CBG monitoring, ED     Status: Abnormal   Collection Time: 09/21/19  9:04 PM  Result Value Ref Range   Glucose-Capillary 451 (H) 70 - 99 mg/dL    Comment: Glucose reference range applies only to samples taken after fasting for at least 8  hours.   Comment 1 Notify RN    Comment 2 Document in Chart   CBG monitoring, ED     Status: Abnormal   Collection Time: 09/22/19  8:27 AM  Result Value Ref Range   Glucose-Capillary 429 (H) 70 - 99 mg/dL    Comment: Glucose reference range applies only to samples taken after fasting for  at least 8 hours.  CBG monitoring, ED     Status: Abnormal   Collection Time: 09/22/19 11:55 AM  Result Value Ref Range   Glucose-Capillary 341 (H) 70 - 99 mg/dL    Comment: Glucose reference range applies only to samples taken after fasting for at least 8 hours.    Current Facility-Administered Medications  Medication Dose Route Frequency Provider Last Rate Last Admin  . abiraterone acetate (ZYTIGA) tablet 1,000 mg  1,000 mg Oral Daily Long, Wonda Olds, MD      . acetaminophen (TYLENOL) tablet 650 mg  650 mg Oral Q4H PRN Long, Wonda Olds, MD      . alum & mag hydroxide-simeth (MAALOX/MYLANTA) 200-200-20 MG/5ML suspension 30 mL  30 mL Oral Q6H PRN Long, Wonda Olds, MD      . escitalopram (LEXAPRO) tablet 5 mg  5 mg Oral Daily Nayely Dingus Y, NP      . hydrOXYzine (ATARAX/VISTARIL) tablet 25 mg  25 mg Oral Q6H PRN Patrecia Pour, NP      . insulin aspart (novoLOG) injection 0-15 Units  0-15 Units Subcutaneous TID WC Long, Wonda Olds, MD   15 Units at 09/22/19 6333   Current Outpatient Medications  Medication Sig Dispense Refill  . abiraterone acetate (ZYTIGA) 250 MG tablet Take 4 tablets by mouth daily *Take on an empty stomach 1 hour BEFORE or 2 hours AFTER a meal. (Patient taking differently: Take 1,000 mg by mouth daily. ) 120 tablet 0  . Insulin Aspart Prot & Aspart (NOVOLOG MIX 70/30 ) Inject 7-12 Units into the skin 2 (two) times daily. Per pt ,  7 units in AM;  12 units in PM    . amLODipine (NORVASC) 10 MG tablet Take 1 tablet (10 mg total) by mouth daily. For high blood pressure (Patient not taking: Reported on 08/25/2019) 30 tablet 0  . aspirin 81 MG EC tablet Take 1 tablet (81 mg total) by mouth  daily. Swallow whole. (Patient not taking: Reported on 08/25/2019) 30 tablet 0  . cholecalciferol (VITAMIN D3) 10 MCG (400 UNIT) TABS tablet Take 1 tablet (400 Units total) by mouth daily. (Patient not taking: Reported on 08/25/2019) 30 tablet 0  . glipiZIDE (GLUCOTROL) 5 MG tablet Take 1 tablet (5 mg total) by mouth daily before breakfast. (Patient not taking: Reported on 08/25/2019) 30 tablet 0  . HYDROcodone-acetaminophen (NORCO) 5-325 MG tablet Take 1 tablet by mouth every 4 (four) hours as needed for moderate pain. (Patient not taking: Reported on 08/25/2019) 20 tablet 0  . metFORMIN (GLUCOPHAGE) 850 MG tablet Take 1 tablet (850 mg total) by mouth 2 (two) times daily with a meal. (Patient not taking: Reported on 08/25/2019) 60 tablet 0  . naltrexone (DEPADE) 50 MG tablet Take 1 tablet (50 mg total) by mouth daily. (Patient not taking: Reported on 08/25/2019) 30 tablet 0  . phenazopyridine (PYRIDIUM) 200 MG tablet Take 1 tablet (200 mg total) by mouth 3 (three) times daily as needed (for pain with urination). (Patient not taking: Reported on 08/25/2019) 30 tablet 0  . tamsulosin (FLOMAX) 0.4 MG CAPS capsule Take 1 capsule (0.4 mg total) by mouth daily after supper. (Patient not taking: Reported on 08/25/2019) 30 capsule 0    Musculoskeletal: Strength & Muscle Tone: decreased Gait & Station: normal Patient leans: N/A  Psychiatric Specialty Exam: Physical Exam Vitals and nursing note reviewed.  Constitutional:      Appearance: Normal appearance.  HENT:     Head: Normocephalic.  Nose: Nose normal.  Pulmonary:     Effort: Pulmonary effort is normal.  Musculoskeletal:        General: Normal range of motion.     Cervical back: Normal range of motion.  Neurological:     General: No focal deficit present.     Mental Status: He is alert and oriented to person, place, and time.  Psychiatric:        Attention and Perception: Attention normal.        Mood and Affect: Mood is anxious and  depressed.        Speech: Speech normal.        Behavior: Behavior normal. Behavior is cooperative.        Thought Content: Thought content includes suicidal ideation. Thought content includes suicidal plan.        Cognition and Memory: Cognition and memory normal.        Judgment: Judgment normal.     Review of Systems  Psychiatric/Behavioral: Positive for dysphoric mood and suicidal ideas. The patient is nervous/anxious.   All other systems reviewed and are negative.   Blood pressure 127/89, pulse (!) 105, temperature 98 F (36.7 C), temperature source Oral, resp. rate 17, SpO2 99 %.There is no height or weight on file to calculate BMI.  General Appearance: Casual  Eye Contact:  Fair  Speech:  Normal Rate  Volume:  Decreased  Mood:  Anxious and Depressed  Affect:  Congruent  Thought Process:  Coherent and Descriptions of Associations: Intact  Orientation:  Full (Time, Place, and Person)  Thought Content:  Rumination  Suicidal Thoughts:  Yes.  with intent/plan  Homicidal Thoughts:  No  Memory:  Immediate;   Fair Recent;   Fair Remote;   Fair  Judgement:  Poor  Insight:  Fair  Psychomotor Activity:  Decreased  Concentration:  Concentration: Fair and Attention Span: Fair  Recall:  [employer] of Knowledge:  Fair  Language:  Good  Akathisia:  No  Handed:  Right  AIMS (if indicated):     Assets:  Leisure Time Resilience Social Support  ADL's:  Intact  Cognition:  WNL  Sleep:        Treatment Plan Summary: Daily contact with patient to assess and evaluate symptoms and progress in treatment, Medication management and Plan major depressive disorder, recurrent, severe without psychosis:  -Started Lexapro 5 mg daily -Admit to inpatient psychiatric hospital  Anxiety: -Started hydroxyzine 25 mg every six hours PRN  Disposition: Recommend psychiatric Inpatient admission when medically cleared.  Waylan Boga, NP 09/22/2019 12:43 PM

## 2019-09-22 NOTE — ED Notes (Signed)
Patient now has chemotherapy drug and antibiotic avaliable at nurses station. Also family member brought a red luggage and cpap machine at nurses station.

## 2019-09-23 ENCOUNTER — Other Ambulatory Visit: Payer: Self-pay

## 2019-09-23 ENCOUNTER — Encounter (HOSPITAL_COMMUNITY): Payer: Self-pay | Admitting: Registered Nurse

## 2019-09-23 ENCOUNTER — Ambulatory Visit
Admission: RE | Admit: 2019-09-23 | Discharge: 2019-09-23 | Disposition: A | Discharge status: Home / Self Care | Payer: 59 | Source: Ambulatory Visit | Attending: Radiation Oncology | Admitting: Radiation Oncology

## 2019-09-23 ENCOUNTER — Encounter: Payer: Self-pay | Admitting: Medical Oncology

## 2019-09-23 ENCOUNTER — Inpatient Hospital Stay (HOSPITAL_COMMUNITY)
Admission: AD | Admit: 2019-09-23 | Discharge: 2019-09-24 | DRG: 885 | Disposition: A | Discharge status: Home / Self Care | Payer: 59 | Source: Intra-hospital | Attending: Psychiatry | Admitting: Psychiatry

## 2019-09-23 ENCOUNTER — Encounter (HOSPITAL_COMMUNITY): Payer: Self-pay | Admitting: Psychiatry

## 2019-09-23 DIAGNOSIS — E119 Type 2 diabetes mellitus without complications: Secondary | ICD-10-CM | POA: Diagnosis present

## 2019-09-23 DIAGNOSIS — I10 Essential (primary) hypertension: Secondary | ICD-10-CM | POA: Diagnosis present

## 2019-09-23 DIAGNOSIS — F10239 Alcohol dependence with withdrawal, unspecified: Secondary | ICD-10-CM | POA: Diagnosis present

## 2019-09-23 DIAGNOSIS — F332 Major depressive disorder, recurrent severe without psychotic features: Secondary | ICD-10-CM | POA: Diagnosis present

## 2019-09-23 DIAGNOSIS — Z794 Long term (current) use of insulin: Secondary | ICD-10-CM

## 2019-09-23 DIAGNOSIS — C61 Malignant neoplasm of prostate: Secondary | ICD-10-CM | POA: Diagnosis present

## 2019-09-23 DIAGNOSIS — F1020 Alcohol dependence, uncomplicated: Secondary | ICD-10-CM | POA: Diagnosis not present

## 2019-09-23 DIAGNOSIS — R45851 Suicidal ideations: Secondary | ICD-10-CM | POA: Diagnosis present

## 2019-09-23 DIAGNOSIS — R599 Enlarged lymph nodes, unspecified: Secondary | ICD-10-CM | POA: Diagnosis present

## 2019-09-23 DIAGNOSIS — F329 Major depressive disorder, single episode, unspecified: Secondary | ICD-10-CM | POA: Diagnosis present

## 2019-09-23 DIAGNOSIS — F32A Depression, unspecified: Secondary | ICD-10-CM | POA: Diagnosis present

## 2019-09-23 DIAGNOSIS — F1424 Cocaine dependence with cocaine-induced mood disorder: Secondary | ICD-10-CM | POA: Diagnosis not present

## 2019-09-23 LAB — CBG MONITORING, ED
Glucose-Capillary: 326 mg/dL — ABNORMAL HIGH (ref 70–99)
Glucose-Capillary: 341 mg/dL — ABNORMAL HIGH (ref 70–99)
Glucose-Capillary: 221 mg/dL — ABNORMAL HIGH (ref 70–99)

## 2019-09-23 LAB — GLUCOSE, CAPILLARY: Glucose-Capillary: 287 mg/dL — ABNORMAL HIGH (ref 70–99)

## 2019-09-23 MED ORDER — HYDROXYZINE HCL 25 MG PO TABS
25.0000 mg | ORAL_TABLET | Freq: Four times a day (QID) | ORAL | Status: DC | PRN
  Filled 2019-09-23: qty 1

## 2019-09-23 MED ORDER — ONDANSETRON 4 MG PO TBDP
4.0000 mg | ORAL_TABLET | Freq: Four times a day (QID) | ORAL | Status: DC | PRN
  Filled 2019-09-23: qty 1

## 2019-09-23 MED ORDER — THIAMINE HCL 100 MG PO TABS
100.0000 mg | ORAL_TABLET | Freq: Every day | ORAL | Status: DC
  Filled 2019-09-23: qty 1

## 2019-09-23 MED ORDER — THIAMINE HCL 100 MG PO TABS
100.0000 mg | ORAL_TABLET | Freq: Every day | ORAL | Status: DC
  Filled 2019-09-23 (×2): qty 1

## 2019-09-23 MED ORDER — ONDANSETRON 4 MG PO TBDP: 4.0000 mg | ORAL_TABLET | Freq: Four times a day (QID) | ORAL | Status: DC | PRN

## 2019-09-23 MED ORDER — FOLIC ACID 1 MG PO TABS
1.0000 mg | ORAL_TABLET | Freq: Every day | ORAL | Status: DC
  Filled 2019-09-23 (×2): qty 1

## 2019-09-23 MED ORDER — LOPERAMIDE HCL 2 MG PO CAPS
2.0000 mg | ORAL_CAPSULE | ORAL | Status: DC | PRN
  Administered 2019-09-23: 2 mg via ORAL
  Filled 2019-09-23: qty 1

## 2019-09-23 MED ORDER — MIRTAZAPINE 15 MG PO TBDP
15.0000 mg | ORAL_TABLET | Freq: Every day | ORAL | Status: DC
  Administered 2019-09-23: 15 mg via ORAL
  Filled 2019-09-23 (×3): qty 1

## 2019-09-23 MED ORDER — LOPERAMIDE HCL 2 MG PO CAPS: 2.0000 mg | ORAL_CAPSULE | ORAL | Status: DC | PRN

## 2019-09-23 MED ORDER — LORAZEPAM 1 MG PO TABS
1.0000 mg | ORAL_TABLET | Freq: Four times a day (QID) | ORAL | Status: DC | PRN
  Administered 2019-09-24: 1 mg via ORAL
  Filled 2019-09-23: qty 1

## 2019-09-23 MED ORDER — THIAMINE HCL 100 MG/ML IJ SOLN: 100.0000 mg | Freq: Once | INTRAMUSCULAR | Status: DC

## 2019-09-23 MED ORDER — ADULT MULTIVITAMIN W/MINERALS CH
1.0000 | ORAL_TABLET | Freq: Every day | ORAL | Status: DC
  Filled 2019-09-23 (×4): qty 1

## 2019-09-23 MED ORDER — CEPHALEXIN 500 MG PO CAPS
500.0000 mg | ORAL_CAPSULE | Freq: Two times a day (BID) | ORAL | Status: DC
  Administered 2019-09-23: 500 mg via ORAL
  Filled 2019-09-23: qty 1

## 2019-09-23 MED ORDER — INSULIN ASPART 100 UNIT/ML ~~LOC~~ SOLN
0.0000 [IU] | Freq: Three times a day (TID) | SUBCUTANEOUS | Status: DC
  Administered 2019-09-24: 5 [IU] via SUBCUTANEOUS
  Administered 2019-09-24: 7 [IU] via SUBCUTANEOUS

## 2019-09-23 MED ORDER — ABIRATERONE ACETATE 250 MG PO TABS
1000.0000 mg | ORAL_TABLET | Freq: Every day | ORAL | Status: DC
  Administered 2019-09-24: 1000 mg via ORAL

## 2019-09-23 NOTE — ED Notes (Signed)
[facility] answered and said they would arrive soon but that there might be a little delay because there are 2 rides in line in front.

## 2019-09-23 NOTE — Progress Notes (Signed)
Pt reports increased depression since he was diagnosed with prostate cancer in October. Pt also reports financial difficulties because he's still "fighting for insurance" and trying to get disability to cover his treatment costs. Pt said he was also recently diagnosed with a UTI and is on his 3rd week out of 8 on receiving his radiation treatment. Pt said he's had a lot of stressors and wants to work on developing coping skills. Active listening, reassurance, and support provided. Medications administered as ordered by MD. Pt denies SI/HI and AVH. Q 15 min safety checks continue. Pt's safety has been maintained.    09/23/19 2030  Psych Admission Type (Psych Patients Only)  Admission Status Voluntary  Psychosocial Assessment  Patient Complaints Depression;Sadness;Worrying  Eye Contact Fair  Facial Expression Sad  Affect Depressed;Sad;Sullen;Anxious  Speech Argumentative;Soft  Interaction Assertive  Motor Activity Slow  Appearance/Hygiene In scrubs  Behavior Characteristics Cooperative;Calm;Anxious  Mood Depressed;Anxious;Sad  Thought Process  Coherency WDL  Content WDL  Delusions None reported or observed  Perception WDL  Hallucination None reported or observed  Judgment Impaired  Confusion None  Danger to Self  Current suicidal ideation? Denies  Danger to Others  Danger to Others None reported or observed

## 2019-09-23 NOTE — ED Notes (Signed)
Pt asks frequently about his radiation therapy. States that he gets it Monday thought Friday in the afternoon. This Advertising account planner Dr. Maryan Rued about the radiation therapy. Waiting to hear back.

## 2019-09-23 NOTE — ED Provider Notes (Signed)
Emergency Medicine Observation Re-evaluation Note  Jonathan Austin is a 54 y.o. male, seen on rounds today.  Pt initially presented to the ED for complaints of Suicidal Currently, the patient is sleeping after having breakfast.  Physical Exam  BP 122/82 (BP Location: Right Arm)   Pulse (!) 106   Temp (!) 97.5 F (36.4 C) (Oral)   Resp 18   SpO2 94%  Physical Exam Constitutional:      General: He is not in acute distress.    Appearance: Normal appearance.  HENT:     Head: Normocephalic.  Cardiovascular:     Rate and Rhythm: Normal rate.  Pulmonary:     Effort: Pulmonary effort is normal.  Skin:    General: Skin is warm.  Psychiatric:     Comments: Calm and cooperative     ED Course / MDM  EKG:    I have reviewed the labs performed to date as well as medications administered while in observation.  Recent changes in the last 24 hours include none. Plan  Current plan is for psych disposition but also pt states he was taking abx for UTI and given the final 3 days.    Blanchie Dessert, MD 09/23/19 1027

## 2019-09-23 NOTE — ED Notes (Signed)
Pt signed consent to go to [facility].

## 2019-09-23 NOTE — Progress Notes (Signed)
Doris-wife called stating her husband is in the ER at [facility] waiting to be admitted. She states he became depressed and felt suicidal on Friday, coming to the ED. She does not think he will harm himself but struggling with cancer and every day trials. She states h e is worried about his radiations treatments and would like to come if possible. . I will call the ER, speak with his nurse and see if he will be allowed to come over. I will call her back.

## 2019-09-23 NOTE — Tx Team (Signed)
Initial Treatment Plan 09/23/2019 7:32 PM [name] [id number]    PATIENT STRESSORS: Financial difficulties Health problems Substance abuse   PATIENT STRENGTHS: Ability for insight Active sense of humor Communication skills Supportive family/friends   PATIENT IDENTIFIED PROBLEMS: "I got to get help with the cocaine and alcohol."  Suicidal ideation  Depression                 DISCHARGE CRITERIA:  Improved stabilization in mood, thinking, and/or behavior  PRELIMINARY DISCHARGE PLAN: Attend aftercare/continuing care group  PATIENT/FAMILY INVOLVEMENT: This treatment plan has been presented to and reviewed with the patient, [name].  The patient has been given the opportunity to ask questions and make suggestions.  Luna Glasgow, RN 09/23/2019, 7:32 PM

## 2019-09-23 NOTE — Consult Note (Signed)
[facility] Psych ED Progress Note  09/23/2019 12:09 PM [name]  MRN:  [id number]   Subjective:  "I've been having suicidal thoughts; well; I have prostate cancer and taking radiation, last week had a urinary tract infection, cant work, bills are due, very depressed and been drinking alcohol to help."    Jonathan Austin, 54 y.o., male patient seen via tele psych by this provider, consulted with Dr. Dwyane Dee; and chart reviewed on 09/23/19.  On evaluation Jonathan Austin reports that he continues to be depressed with suicidal ideation and unable to contract for safety; will continue to search for inpatient psychiatric bed During evaluation Jonathan Austin is alert/oriented x 4; calm/cooperative; and mood is congruent with affect.  He does not appear to be responding to internal/external stimuli or delusional thoughts.  Patient denies homicidal ideation, psychosis, and paranoia.  Patient answered question appropriately.   Principal Problem: Major depressive disorder, recurrent severe without psychotic features ([place]) Diagnosis:  Principal Problem:   Major depressive disorder, recurrent severe without psychotic features ([place]) Active Problems:   Cocaine use disorder, severe, dependence ([place])  Total Time spent with patient: 30 minutes  Past Psychiatric History: Depression, polysubstance use  Past Medical History:  Past Medical History:  Diagnosis Date  . Alcohol use disorder, severe, dependence ([place])    05-24-2019 recent [facility] admission for suicide ideation and alcohol / cocaine detox 05-04-2019 in epic,  pt stated has been taking naltrexone daily as prescribed since and no alcohol since  . Cocaine abuse ([place])    05-24-2019  pt had recent [facility] admission, pt stated last used  "crack" 05-03-2019  . Cocaine abuse with cocaine-induced mood disorder ([place])   . Depression   . History of suicidal ideation    multiple [place] admission's;  last one admitted 05-04-2019  . Hyperplasia of prostate with lower urinary  tract symptoms (LUTS)   . Hypertension    followed by pcp  . OSA on CPAP    study 10-02-2012 in epic  . Phimosis   . Prostate cancer [facility]) urologist-- dr winter   dx 02/ 2021;  localized advanced, gleason 4+4, PSA 317  . Type 2 diabetes mellitus treated with insulin ([place])    followed by pcp   (05-24-2019  per stated checks blood sugar twice daily, fasting sugar-- 180-200)  . Wears glasses     Past Surgical History:  Procedure Laterality Date  . CIRCUMCISION N/A 05/29/2019   Procedure: CIRCUMCISION ADULT;  Surgeon: Ceasar Mons, MD;  Location: [facility];  Service: Urology;  Laterality: N/A;  . PROSTATE BIOPSY    . TONSILLECTOMY  child  . TRANSURETHRAL RESECTION OF PROSTATE N/A 05/29/2019   Procedure: TRANSURETHRAL RESECTION OF THE PROSTATE (TURP)/ CYSTOSCOPY;  Surgeon: Ceasar Mons, MD;  Location: [facility];  Service: Urology;  Laterality: N/A;   Family History:  Family History  Problem Relation Age of Onset  . Hypertension Mother   . Hypertension Father   . Diabetes Father   . Prostate cancer Maternal Grandfather 96  . Breast cancer Neg Hx   . Colon cancer Neg Hx   . Pancreatic cancer Neg Hx    Family Psychiatric  History: None Social History:  Social History   Substance and Sexual Activity  Alcohol Use Yes   Comment: drinks beer 40 oz x 2 daily  (05-24-2019 per pt no alcohol since 05-03-2019)     Social History   Substance and Sexual Activity  Drug Use Yes  . Types: Cocaine,  Marijuana   Comment: smokes 2g crack daily  (05-24-2019 per pt last crack and marijuana 05-03-2019)    Social History   Socioeconomic History  . Marital status: Married    Spouse name: Doris  . Number of children: 1  . Years of education: Not on file  . Highest education level: Not on file  Occupational History  . Not on file  Tobacco Use  . Smoking status: Never Smoker  . Smokeless tobacco: Never Used  Vaping Use  . Vaping  Use: Never used  Substance and Sexual Activity  . Alcohol use: Yes    Comment: drinks beer 40 oz x 2 daily  (05-24-2019 per pt no alcohol since 05-03-2019)  . Drug use: Yes    Types: Cocaine, Marijuana    Comment: smokes 2g crack daily  (05-24-2019 per pt last crack and marijuana 05-03-2019)  . Sexual activity: Yes  Other Topics Concern  . Not on file  Social History Narrative   Only child is deceased   Social Determinants of Radio broadcast assistant Strain:   . Difficulty of Paying Living Expenses:   Food Insecurity:   . Worried About Charity fundraiser in the Last Year:   . Arboriculturist in the Last Year:   Transportation Needs:   . Film/video editor (Medical):   Marland Kitchen Lack of Transportation (Non-Medical):   Physical Activity:   . Days of Exercise per Week:   . Minutes of Exercise per Session:   Stress:   . Feeling of Stress :   Social Connections:   . Frequency of Communication with Friends and Family:   . Frequency of Social Gatherings with Friends and Family:   . Attends Religious Services:   . Active Member of Clubs or Organizations:   . Attends Archivist Meetings:   Marland Kitchen Marital Status:     Sleep: Fair  Appetite:  Good  Current Medications: Current Facility-Administered Medications  Medication Dose Route Frequency Provider Last Rate Last Admin  . abiraterone acetate (ZYTIGA) tablet 1,000 mg  1,000 mg Oral Daily Long, Wonda Olds, MD   1,000 mg at 09/23/19 1037  . acetaminophen (TYLENOL) tablet 650 mg  650 mg Oral Q4H PRN Long, Wonda Olds, MD      . alum & mag hydroxide-simeth (MAALOX/MYLANTA) 200-200-20 MG/5ML suspension 30 mL  30 mL Oral Q6H PRN Long, Wonda Olds, MD      . cephALEXin (KEFLEX) capsule 500 mg  500 mg Oral Q12H Blanchie Dessert, MD   500 mg at 09/23/19 1032  . escitalopram (LEXAPRO) tablet 5 mg  5 mg Oral Daily Patrecia Pour, NP   5 mg at 09/23/19 1032  . hydrOXYzine (ATARAX/VISTARIL) tablet 25 mg  25 mg Oral Q6H PRN Patrecia Pour,  NP      . insulin aspart (novoLOG) injection 0-15 Units  0-15 Units Subcutaneous TID WC Long, Wonda Olds, MD   11 Units at 09/23/19 1146   Current Outpatient Medications  Medication Sig Dispense Refill  . abiraterone acetate (ZYTIGA) 250 MG tablet Take 4 tablets by mouth daily *Take on an empty stomach 1 hour BEFORE or 2 hours AFTER a meal. (Patient taking differently: Take 1,000 mg by mouth daily. ) 120 tablet 0  . Insulin Aspart Prot & Aspart (NOVOLOG MIX 70/30 [place]) Inject 7-12 Units into the skin 2 (two) times daily. Per pt ,  7 units in AM;  12 units in PM    . amLODipine (  NORVASC) 10 MG tablet Take 1 tablet (10 mg total) by mouth daily. For high blood pressure (Patient not taking: Reported on 08/25/2019) 30 tablet 0  . aspirin 81 MG EC tablet Take 1 tablet (81 mg total) by mouth daily. Swallow whole. (Patient not taking: Reported on 08/25/2019) 30 tablet 0  . cholecalciferol (VITAMIN D3) 10 MCG (400 UNIT) TABS tablet Take 1 tablet (400 Units total) by mouth daily. (Patient not taking: Reported on 08/25/2019) 30 tablet 0  . glipiZIDE (GLUCOTROL) 5 MG tablet Take 1 tablet (5 mg total) by mouth daily before breakfast. (Patient not taking: Reported on 08/25/2019) 30 tablet 0  . HYDROcodone-acetaminophen (NORCO) 5-325 MG tablet Take 1 tablet by mouth every 4 (four) hours as needed for moderate pain. (Patient not taking: Reported on 08/25/2019) 20 tablet 0  . metFORMIN (GLUCOPHAGE) 850 MG tablet Take 1 tablet (850 mg total) by mouth 2 (two) times daily with a meal. (Patient not taking: Reported on 08/25/2019) 60 tablet 0  . naltrexone (DEPADE) 50 MG tablet Take 1 tablet (50 mg total) by mouth daily. (Patient not taking: Reported on 08/25/2019) 30 tablet 0  . phenazopyridine (PYRIDIUM) 200 MG tablet Take 1 tablet (200 mg total) by mouth 3 (three) times daily as needed (for pain with urination). (Patient not taking: Reported on 08/25/2019) 30 tablet 0  . tamsulosin (FLOMAX) 0.4 MG CAPS capsule Take 1 capsule (0.4  mg total) by mouth daily after supper. (Patient not taking: Reported on 08/25/2019) 30 capsule 0    Lab Results:  Results for orders placed or performed during the hospital encounter of 09/21/19 (from the past 48 hour(s))  CBG monitoring, ED     Status: Abnormal   Collection Time: 09/21/19 12:31 PM  Result Value Ref Range   Glucose-Capillary 343 (H) 70 - 99 mg/dL    Comment: Glucose reference range applies only to samples taken after fasting for at least 8 hours.  CBG monitoring, ED     Status: Abnormal   Collection Time: 09/21/19  4:36 PM  Result Value Ref Range   Glucose-Capillary 188 (H) 70 - 99 mg/dL    Comment: Glucose reference range applies only to samples taken after fasting for at least 8 hours.  CBG monitoring, ED     Status: Abnormal   Collection Time: 09/21/19  9:04 PM  Result Value Ref Range   Glucose-Capillary 451 (H) 70 - 99 mg/dL    Comment: Glucose reference range applies only to samples taken after fasting for at least 8 hours.   Comment 1 Notify RN    Comment 2 Document in Chart   CBG monitoring, ED     Status: Abnormal   Collection Time: 09/22/19  8:27 AM  Result Value Ref Range   Glucose-Capillary 429 (H) 70 - 99 mg/dL    Comment: Glucose reference range applies only to samples taken after fasting for at least 8 hours.  CBG monitoring, ED     Status: Abnormal   Collection Time: 09/22/19 11:55 AM  Result Value Ref Range   Glucose-Capillary 341 (H) 70 - 99 mg/dL    Comment: Glucose reference range applies only to samples taken after fasting for at least 8 hours.  CBG monitoring, ED     Status: Abnormal   Collection Time: 09/22/19  4:30 PM  Result Value Ref Range   Glucose-Capillary 174 (H) 70 - 99 mg/dL    Comment: Glucose reference range applies only to samples taken after fasting for at least 8 hours.  CBG monitoring, ED     Status: Abnormal   Collection Time: 09/23/19  7:45 AM  Result Value Ref Range   Glucose-Capillary 326 (H) 70 - 99 mg/dL    Comment:  Glucose reference range applies only to samples taken after fasting for at least 8 hours.  CBG monitoring, ED     Status: Abnormal   Collection Time: 09/23/19 11:27 AM  Result Value Ref Range   Glucose-Capillary 341 (H) 70 - 99 mg/dL    Comment: Glucose reference range applies only to samples taken after fasting for at least 8 hours.    Blood Alcohol level:  Lab Results  Component Value Date   ETH <10 09/21/2019   ETH 142 (H) 08/25/2019    Physical Findings: AIMS:  , ,  ,  ,    CIWA:    COWS:     Musculoskeletal: Strength & Muscle Tone: within normal limits Gait & Station: normal Patient leans: N/A  Psychiatric Specialty Exam: Physical Exam Vitals and nursing note reviewed. Exam conducted with a chaperone present.  Constitutional:      Appearance: Normal appearance. He is obese.  Pulmonary:     Effort: Pulmonary effort is normal.  Musculoskeletal:        General: Normal range of motion.  Neurological:     Mental Status: He is alert.  Psychiatric:        Attention and Perception: Attention and perception normal.        Mood and Affect: Mood is depressed. Affect is flat.        Speech: Speech normal.        Behavior: Behavior normal. Behavior is cooperative.        Thought Content: Thought content is not paranoid. Thought content includes suicidal ideation. Thought content does not include homicidal ideation. Thought content includes suicidal plan.        Cognition and Memory: Cognition and memory normal.        Judgment: Judgment is impulsive.     Review of Systems  Psychiatric/Behavioral: Positive for suicidal ideas. Negative for hallucinations.       Patient reporting prostate cancer, bills due, unable to work as stressors for suicidal ideation     Blood pressure 122/82, pulse (!) 106, temperature (!) 97.5 F (36.4 C), temperature source Oral, resp. rate 18, SpO2 94 %.There is no height or weight on file to calculate BMI.  General Appearance: Casual  Eye  Contact:  Good  Speech:  Clear and Coherent and Normal Rate  Volume:  Normal  Mood:  Depressed  Affect:  Congruent and Depressed  Thought Process:  Coherent, Goal Directed and Descriptions of Associations: Intact  Orientation:  Full (Time, Place, and Person)  Thought Content:  WDL  Suicidal Thoughts:  Yes.  with intent/plan  Homicidal Thoughts:  No  Memory:  Immediate;   Good Recent;   Good  Judgement:  Impaired  Insight:  Lacking  Psychomotor Activity:  Normal  Concentration:  Concentration: Good and Attention Span: Good  Recall:  Good  Fund of Knowledge:  Good  Language:  Good  Akathisia:  No  Handed:  Right  AIMS (if indicated):     Assets:  Communication Skills Desire for Improvement  ADL's:  Intact  Cognition:  WNL  Sleep:      Treatment Plan Summary: Daily contact with patient to assess and evaluate symptoms and progress in treatment, Medication management and Plan Inpatient psychiatric treatment  Tekela Garguilo, NP 09/23/2019, 12:09 PM

## 2019-09-23 NOTE — BH Assessment (Signed)
Grandfather Assessment Progress Note  Per Shuvon Rankin, NP, this pt requires psychiatric hospitalization at this time.  Kathalene Frames, RN has assigned pt to [facility] Rm 301-2; [place] will be ready to receive pt at 17:00.  Pt's nurse, Diane, has been notified.  She agrees to have pt sign consents, to send original paperwork along with pt via Safe Transport, and to call report to [phone].  Jalene Mullet, [place] Coordinator [phone]

## 2019-09-23 NOTE — ED Notes (Signed)
Pt off unit for radiation therapy

## 2019-09-23 NOTE — Progress Notes (Signed)
Spoke with patient's nurse in [place] and explained he is scheduled for daily radiation and patient would like to come if possible. She feels patient is safe to come with his sitter, if I can come get him. I will be happy to transport him to radiation.

## 2019-09-23 NOTE — Progress Notes (Signed)
   09/23/19 2030  COVID-19 Daily Checkoff  Have you had a fever (temp > 37.80C/100F)  in the past 24 hours?  No  COVID-19 EXPOSURE  Have you traveled outside the state in the past 14 days? No  Have you been in contact with someone with a confirmed diagnosis of COVID-19 or PUI in the past 14 days without wearing appropriate PPE? No  Have you been living in the same home as a person with confirmed diagnosis of COVID-19 or a PUI (household contact)? No  Have you been diagnosed with COVID-19? No

## 2019-09-23 NOTE — ED Notes (Signed)
Report given to Will at [facility].

## 2019-09-23 NOTE — ED Notes (Signed)
Clinical research associate. No answer.

## 2019-09-23 NOTE — ED Notes (Signed)
Transported to [facility] by [facility] [facility]. Belongings given to driver. Pt was calm and cooperative.

## 2019-09-23 NOTE — Progress Notes (Signed)
Spoke with wife to let her know,patient was able to get his radiation treatment today. He still does not have a bed assignment but hopefully he will be admitted her in [place], so he can continue this treatments. She was very thankful and relieved he was able to get treated. I asked her to call me with questions or concerns.

## 2019-09-23 NOTE — Progress Notes (Signed)
Pt is a 54 y.o male, voluntarily admitted to [facility] (301-2) for SI.  Pt dislcosed that he is currently getting treatment for prostate CA with Zytiga and radiation (Monday through Friday) 5 times per week.  Pt receives radiation at [facility] at about 2:15pm on scheduled days.  Pt said he has had many stressors at home with stage 4 prostate ca, extreme financial difficulties and he was recently denied for disability. Pt said that his wife has a lot of pressure to keep the home going financially and in other areas.   Pt has been using cocaine and ETOH to cope with his stress.  Pt said his church family is also supportive.    Pt signed admission consent form and helped develop initial treatment plan.  RN initiated q 15 min safety checks.  Vital signs stable at this time.

## 2019-09-23 NOTE — ED Notes (Signed)
Unable to reach Safe Transport at this time.

## 2019-09-23 NOTE — ED Notes (Addendum)
Pt continues to be calm and cooperative. 

## 2019-09-24 ENCOUNTER — Other Ambulatory Visit: Payer: Self-pay

## 2019-09-24 ENCOUNTER — Ambulatory Visit
Admission: RE | Admit: 2019-09-24 | Discharge: 2019-09-24 | Disposition: A | Discharge status: Home / Self Care | Payer: 59 | Source: Ambulatory Visit | Attending: Radiation Oncology | Admitting: Radiation Oncology

## 2019-09-24 DIAGNOSIS — F1424 Cocaine dependence with cocaine-induced mood disorder: Secondary | ICD-10-CM

## 2019-09-24 DIAGNOSIS — F329 Major depressive disorder, single episode, unspecified: Secondary | ICD-10-CM | POA: Diagnosis present

## 2019-09-24 DIAGNOSIS — C61 Malignant neoplasm of prostate: Secondary | ICD-10-CM | POA: Diagnosis not present

## 2019-09-24 DIAGNOSIS — F1020 Alcohol dependence, uncomplicated: Secondary | ICD-10-CM

## 2019-09-24 DIAGNOSIS — F32A Depression, unspecified: Secondary | ICD-10-CM | POA: Diagnosis present

## 2019-09-24 LAB — GLUCOSE, CAPILLARY
Glucose-Capillary: 257 mg/dL — ABNORMAL HIGH (ref 70–99)
Glucose-Capillary: 344 mg/dL — ABNORMAL HIGH (ref 70–99)

## 2019-09-24 MED ORDER — ACETAMINOPHEN 325 MG PO TABS: 650.0000 mg | ORAL_TABLET | Freq: Four times a day (QID) | ORAL | Status: DC | PRN

## 2019-09-24 MED ORDER — TAMSULOSIN HCL 0.4 MG PO CAPS
0.4000 mg | ORAL_CAPSULE | Freq: Every day | ORAL | Status: DC
  Filled 2019-09-24: qty 1

## 2019-09-24 MED ORDER — ACAMPROSATE CALCIUM 333 MG PO TBEC
666.0000 mg | DELAYED_RELEASE_TABLET | Freq: Two times a day (BID) | ORAL | Status: DC
  Filled 2019-09-24 (×2): qty 2

## 2019-09-24 MED ORDER — THIAMINE HCL 100 MG PO TABS: 100.0000 mg | ORAL_TABLET | Freq: Every day | ORAL | 0 refills | Status: DC

## 2019-09-24 MED ORDER — ENSURE ENLIVE PO LIQD: 237.0000 mL | ORAL | Status: DC

## 2019-09-24 MED ORDER — ONDANSETRON 4 MG PO TBDP: 4.0000 mg | ORAL_TABLET | Freq: Four times a day (QID) | ORAL | 0 refills | Status: DC | PRN

## 2019-09-24 MED ORDER — MIRTAZAPINE 15 MG PO TABS
15.0000 mg | ORAL_TABLET | Freq: Every day | ORAL | Status: DC
  Filled 2019-09-24: qty 1

## 2019-09-24 MED ORDER — GLIPIZIDE 5 MG PO TABS: 5.0000 mg | ORAL_TABLET | Freq: Every day | ORAL | Status: DC

## 2019-09-24 MED ORDER — INSULIN ASPART PROT & ASPART (70-30 MIX) 100 UNIT/ML ~~LOC~~ SUSP: 12.0000 [IU] | Freq: Every day | SUBCUTANEOUS | Status: DC

## 2019-09-24 MED ORDER — CHOLECALCIFEROL 10 MCG (400 UNIT) PO TABS
400.0000 [IU] | ORAL_TABLET | Freq: Every day | ORAL | Status: DC
  Filled 2019-09-24 (×2): qty 1

## 2019-09-24 MED ORDER — AMLODIPINE BESYLATE 10 MG PO TABS
10.0000 mg | ORAL_TABLET | Freq: Every day | ORAL | Status: DC
  Administered 2019-09-24: 10 mg via ORAL
  Filled 2019-09-24: qty 2
  Filled 2019-09-24 (×2): qty 1

## 2019-09-24 MED ORDER — MAGNESIUM HYDROXIDE 400 MG/5ML PO SUSP: 30.0000 mL | Freq: Every day | ORAL | Status: DC | PRN

## 2019-09-24 MED ORDER — CEPHALEXIN 500 MG PO CAPS
500.0000 mg | ORAL_CAPSULE | Freq: Two times a day (BID) | ORAL | Status: DC
  Administered 2019-09-24: 500 mg via ORAL
  Filled 2019-09-24 (×3): qty 1

## 2019-09-24 MED ORDER — LORAZEPAM 1 MG PO TABS: 1.0000 mg | ORAL_TABLET | Freq: Four times a day (QID) | ORAL | Status: DC | PRN

## 2019-09-24 MED ORDER — GLIPIZIDE 5 MG PO TABS
5.0000 mg | ORAL_TABLET | Freq: Every day | ORAL | Status: DC
  Filled 2019-09-24 (×2): qty 1

## 2019-09-24 MED ORDER — ACAMPROSATE CALCIUM 333 MG PO TBEC: 666.0000 mg | DELAYED_RELEASE_TABLET | Freq: Two times a day (BID) | ORAL | 0 refills | Status: DC

## 2019-09-24 MED ORDER — MIRTAZAPINE 15 MG PO TABS: 15.0000 mg | ORAL_TABLET | Freq: Every day | ORAL | 0 refills | Status: DC

## 2019-09-24 MED ORDER — ALUM & MAG HYDROXIDE-SIMETH 200-200-20 MG/5ML PO SUSP: 30.0000 mL | ORAL | Status: DC | PRN

## 2019-09-24 MED ORDER — FOLIC ACID 1 MG PO TABS: 1.0000 mg | ORAL_TABLET | Freq: Every day | ORAL | 0 refills | Status: DC

## 2019-09-24 MED ORDER — LORAZEPAM 1 MG PO TABS: 1.0000 mg | ORAL_TABLET | Freq: Four times a day (QID) | ORAL | 0 refills | Status: DC | PRN

## 2019-09-24 MED ORDER — CEPHALEXIN 500 MG PO CAPS: 500.0000 mg | ORAL_CAPSULE | Freq: Two times a day (BID) | ORAL | 0 refills | Status: DC

## 2019-09-24 MED ORDER — INSULIN ASPART PROT & ASPART (70-30 MIX) 100 UNIT/ML ~~LOC~~ SUSP: 50.0000 [IU] | Freq: Every day | SUBCUTANEOUS | Status: DC

## 2019-09-24 MED ORDER — ADULT MULTIVITAMIN W/MINERALS CH: 1.0000 | ORAL_TABLET | Freq: Every day | ORAL | 0 refills | Status: DC

## 2019-09-24 MED ORDER — ATORVASTATIN CALCIUM 10 MG PO TABS
10.0000 mg | ORAL_TABLET | Freq: Every day | ORAL | Status: DC
  Filled 2019-09-24 (×2): qty 1

## 2019-09-24 MED ORDER — ATORVASTATIN CALCIUM 10 MG PO TABS: 10.0000 mg | ORAL_TABLET | Freq: Every day | ORAL | 0 refills | Status: DC

## 2019-09-24 NOTE — H&P (Signed)
Psychiatric Admission Assessment Adult  Patient Identification: Jonathan Austin MRN:  [id number] Date of Evaluation:  09/24/2019 Chief Complaint:  Depression [F32.9] Principal Diagnosis: <principal problem not specified> Diagnosis:  Active Problems:   Major depressive disorder, recurrent severe without psychotic features ([place])   Alcohol use disorder, severe, dependence ([place])   Depression  History of Present Illness: previous unresolved grief issues and depression who presented originally to the [facility] emergency department on 09/21/2019 with worsening depression and suicidal ideation.  The patient is recently been diagnosed with prostate cancer and has been receiving radiation treatment.  He was also recently started on antibiotics for urinary tract infection.  He stated he was under increasing stress and depression.  He had intermittent thoughts of suicidal ideation.  At the time of the evaluation he reported multiple plans to kill himself including driving his car off the road, overdosing or cutting himself.  He stated that his most significant stressor was his health and he was "very frustrated with himself".  He reported at that time using crack cocaine in various amounts over the last 2 months.  He stated after his last psychiatric hospitalization that he had remained sober for 1 to 2 months afterwards.  He also reported ongoing problems with alcohol and consumes 6-12 beers 3-4 times a week.  He had been seen recently in July for similar circumstances, and was discharged secondary to the fact he did not fulfill criteria for admission.  He was instructed to follow-up with family services of the [place], but failed to do so.  He continues to live with his wife.  On examination today he is scheduled to have his next radiation treatment this afternoon, the patient and I discussed this.  He feels that the radiation treatment is far more important to him, and shows that he does want  to live, that he does not want to kill himself.  Upon discharge from the hospital in March 2021 he was placed on naltrexone 50 mg p.o. daily, but no other psychiatric medications. During his course in the emergency department as well as the [facility] he was placed on mirtazapine 15 mg p.o. nightly last night.  His blood pressure and heart rate were elevated this morning.  His blood pressure was 137/115 his heart rate was 121.  The patient has been treated with amlodipine in the past, but has not been taking that at home.  His blood alcohol on admission was less than 10.  Liver function enzymes were essentially normal.  Associated Signs/Symptoms: Depression Symptoms:  depressed mood, anhedonia, fatigue, suicidal thoughts without plan, anxiety, disturbed sleep, weight loss, (Hypo) Manic Symptoms:  Irritable Mood, Anxiety Symptoms:  Excessive Worry, Psychotic Symptoms:  Denied PTSD Symptoms: Negative Total Time spent with patient: 1 hour  Past Psychiatric History: Patient is familiar to me from a previous psychiatric hospitalization in March of this year.  He was hospitalized at that time secondary to alcohol dependence, cocaine dependence and unresolved grief issues.  He has had several psychiatric hospitalizations in our facility in the past.  His last again was on 05/04/2019.  Is the patient at risk to self? No.  Has the patient been a risk to self in the past 6 months? Yes.    Has the patient been a risk to self within the distant past? No.  Is the patient a risk to others? No.  Has the patient been a risk to others in the past 6 months? No.  Has  the patient been a risk to others within the distant past? No.   Prior Inpatient Therapy:   Prior Outpatient Therapy:    Alcohol Screening: 1. How often do you have a drink containing alcohol?: 2 to 3 times a week 2. How many drinks containing alcohol do you have on a typical day when you are drinking?: 5 or  6 3. How often do you have six or more drinks on one occasion?: Weekly AUDIT-C Score: 8 4. How often during the last year have you found that you were not able to stop drinking once you had started?: Weekly 6. How often during the last year have you needed a first drink in the morning to get yourself going after a heavy drinking session?: Monthly 8. How often during the last year have you been unable to remember what happened the night before because you had been drinking?: Less than monthly 9. Have you or someone else been injured as a result of your drinking?: No 10. Has a relative or friend or a doctor or another health worker been concerned about your drinking or suggested you cut down?: Yes, during the last year Alcohol Use Disorder Identification Test Final Score (AUDIT): 18 Substance Abuse History in the last 12 months:  Yes.   Consequences of Substance Abuse: Medical Consequences:  His continued cocaine and alcohol abuse have led to both his most recent hospitalizations. Previous Psychotropic Medications: Yes  Psychological Evaluations: Yes  Past Medical History:  Past Medical History:  Diagnosis Date  . Alcohol use disorder, severe, dependence ([place])    05-24-2019 recent [facility] admission for suicide ideation and alcohol / cocaine detox 05-04-2019 in epic,  pt stated has been taking naltrexone daily as prescribed since and no alcohol since  . Cocaine abuse ([place])    05-24-2019  pt had recent [facility] admission, pt stated last used  "crack" 05-03-2019  . Cocaine abuse with cocaine-induced mood disorder ([place])   . Depression   . History of suicidal ideation    multiple [place] admission's;  last one admitted 05-04-2019  . Hyperplasia of prostate with lower urinary tract symptoms (LUTS)   . Hypertension    followed by pcp  . OSA on CPAP    study 10-02-2012 in epic  . Phimosis   . Prostate cancer [facility]) urologist-- dr winter   dx 02/ 2021;  localized advanced, gleason 4+4, PSA 317  . Type 2  diabetes mellitus treated with insulin ([place])    followed by pcp   (05-24-2019  per stated checks blood sugar twice daily, fasting sugar-- 180-200)  . Wears glasses     Past Surgical History:  Procedure Laterality Date  . CIRCUMCISION N/A 05/29/2019   Procedure: CIRCUMCISION ADULT;  Surgeon: Ceasar Mons, MD;  Location: [facility];  Service: Urology;  Laterality: N/A;  . PROSTATE BIOPSY    . TONSILLECTOMY  child  . TRANSURETHRAL RESECTION OF PROSTATE N/A 05/29/2019   Procedure: TRANSURETHRAL RESECTION OF THE PROSTATE (TURP)/ CYSTOSCOPY;  Surgeon: Ceasar Mons, MD;  Location: [facility];  Service: Urology;  Laterality: N/A;   Family History:  Family History  Problem Relation Age of Onset  . Hypertension Mother   . Hypertension Father   . Diabetes Father   . Prostate cancer Maternal Grandfather 96  . Breast cancer Neg Hx   . Colon cancer Neg Hx   . Pancreatic cancer Neg Hx    Family Psychiatric  History: He has several secondary relatives who  have attempted suicide and committed suicide in the past.  He had a great uncle who burned himself while drinking alcohol.  He had poured gasoline over himself. Tobacco Screening:   Social History:  Social History   Substance and Sexual Activity  Alcohol Use Yes   Comment: drinks beer 40 oz x 2 daily  (05-24-2019 per pt no alcohol since 05-03-2019)     Social History   Substance and Sexual Activity  Drug Use Yes  . Types: Cocaine, Marijuana   Comment: smokes 2g crack daily  (05-24-2019 per pt last crack and marijuana 05-03-2019)    Additional Social History:    Specify valuables returned: CPap machine, phone suitcase,Locker # [phone]                      Allergies:  No Known Allergies Lab Results:  Results for orders placed or performed during the hospital encounter of 09/23/19 (from the past 48 hour(s))  Glucose, capillary     Status: Abnormal   Collection Time: 09/23/19   8:41 PM  Result Value Ref Range   Glucose-Capillary 287 (H) 70 - 99 mg/dL    Comment: Glucose reference range applies only to samples taken after fasting for at least 8 hours.  Glucose, capillary     Status: Abnormal   Collection Time: 09/24/19  6:05 AM  Result Value Ref Range   Glucose-Capillary 257 (H) 70 - 99 mg/dL    Comment: Glucose reference range applies only to samples taken after fasting for at least 8 hours.  Glucose, capillary     Status: Abnormal   Collection Time: 09/24/19 11:36 AM  Result Value Ref Range   Glucose-Capillary 344 (H) 70 - 99 mg/dL    Comment: Glucose reference range applies only to samples taken after fasting for at least 8 hours.    Blood Alcohol level:  Lab Results  Component Value Date   ETH <10 09/21/2019   ETH 142 (H) 97/98/9211    Metabolic Disorder Labs:  Lab Results  Component Value Date   HGBA1C 11.4 (H) 05/04/2019   MPG 280.48 05/04/2019   MPG 291.96 05/03/2019   No results found for: PROLACTIN Lab Results  Component Value Date   CHOL 265 (H) 08/31/2014   TRIG 351 (H) 08/31/2014   HDL 49 08/31/2014   CHOLHDL 5.4 08/31/2014   VLDL 70 (H) 08/31/2014   LDLCALC 146 (H) 08/31/2014   LDLCALC 163 (H) 05/07/2014    Current Medications: No current facility-administered medications for this encounter.   Current Outpatient Medications  Medication Sig Dispense Refill  . abiraterone acetate (ZYTIGA) 250 MG tablet Take 4 tablets by mouth daily *Take on an empty stomach 1 hour BEFORE or 2 hours AFTER a meal. (Patient taking differently: Take 1,000 mg by mouth daily. ) 120 tablet 0  . acamprosate (CAMPRAL) 333 MG tablet Take 2 tablets (666 mg total) by mouth 2 (two) times daily with a meal. 60 tablet 0  . amLODipine (NORVASC) 10 MG tablet Take 1 tablet (10 mg total) by mouth daily. For high blood pressure (Patient not taking: Reported on 08/25/2019) 30 tablet 0  . aspirin 81 MG EC tablet Take 1 tablet (81 mg total) by mouth daily. Swallow whole.  (Patient not taking: Reported on 08/25/2019) 30 tablet 0  . [START ON 09/25/2019] atorvastatin (LIPITOR) 10 MG tablet Take 1 tablet (10 mg total) by mouth daily. 30 tablet 0  . cephALEXin (KEFLEX) 500 MG capsule Take 1 capsule (500  mg total) by mouth 2 (two) times daily. 10 capsule 0  . [START ON 1/76/1607] folic acid (FOLVITE) 1 MG tablet Take 1 tablet (1 mg total) by mouth daily. 30 tablet 0  . glipiZIDE (GLUCOTROL) 5 MG tablet Take 1 tablet (5 mg total) by mouth daily before breakfast. (Patient not taking: Reported on 08/25/2019) 30 tablet 0  . HYDROcodone-acetaminophen (NORCO) 5-325 MG tablet Take 1 tablet by mouth every 4 (four) hours as needed for moderate pain. (Patient not taking: Reported on 08/25/2019) 20 tablet 0  . Insulin Aspart Prot & Aspart (NOVOLOG MIX 70/30 Cade) Inject 7-12 Units into the skin 2 (two) times daily. Per pt ,  7 units in AM;  12 units in PM    . LORazepam (ATIVAN) 1 MG tablet Take 1 tablet (1 mg total) by mouth every 6 (six) hours as needed (see taper). 30 tablet 0  . mirtazapine (REMERON) 15 MG tablet Take 1 tablet (15 mg total) by mouth at bedtime. 30 tablet 0  . [START ON 09/25/2019] Multiple Vitamin (MULTIVITAMIN WITH MINERALS) TABS tablet Take 1 tablet by mouth daily. 30 tablet 0  . ondansetron (ZOFRAN-ODT) 4 MG disintegrating tablet Take 1 tablet (4 mg total) by mouth every 6 (six) hours as needed for nausea or vomiting. 20 tablet 0  . [START ON 09/25/2019] thiamine 100 MG tablet Take 1 tablet (100 mg total) by mouth daily. 30 tablet 0   PTA Medications: No medications prior to admission.    Musculoskeletal: Strength & Muscle Tone: within normal limits Gait & Station: normal Patient leans: N/A  Psychiatric Specialty Exam: Physical Exam Vitals and nursing note reviewed.  Constitutional:      Appearance: Normal appearance.  HENT:     Head: Normocephalic and atraumatic.  Pulmonary:     Effort: Pulmonary effort is normal.  Neurological:     General: No focal  deficit present.     Mental Status: He is alert and oriented to person, place, and time.     Review of Systems  Blood pressure (!) 137/115, pulse (!) 121, temperature 98.2 F (36.8 C), temperature source Oral, resp. rate 18, height 5\' 7"  (1.702 m), weight 88.9 kg, SpO2 99 %.Body mass index is 30.7 kg/m.  General Appearance: Casual  Eye Contact:  Fair  Speech:  Normal Rate  Volume:  Normal  Mood:  Euthymic  Affect:  Congruent  Thought Process:  Coherent and Descriptions of Associations: Intact  Orientation:  Full (Time, Place, and Person)  Thought Content:  Logical  Suicidal Thoughts:  No  Homicidal Thoughts:  No  Memory:  Immediate;   Fair Recent;   Fair Remote;   Fair  Judgement:  Intact  Insight:  Lacking  Psychomotor Activity:  Normal  Concentration:  Concentration: Fair and Attention Span: Fair  Recall:  [employer] of Knowledge:  Fair  Language:  Fair  Akathisia:  Negative  Handed:  Right  AIMS (if indicated):     Assets:  Desire for Improvement Housing Resilience Social Support  ADL's:  Intact  Cognition:  WNL  Sleep:  Number of Hours: 6.75    Treatment Plan Summary: Daily contact with patient to assess and evaluate symptoms and progress in treatment, Medication management and Plan : Patient is seen and examined.  Patient is a 54 year old male who was admitted with suicidal ideation.  He was seen in the emergency department as well as behavioral health urgent care center.  He was started on mirtazapine last night 15 mg  p.o. nightly for depression and insomnia.  He is requesting to go to his radiation treatment, and we will discharge him today.  We will also restart his insulin, his amlodipine and is well to Keflex for his urinary tract infection.  We will also refer him to the intensive outpatient program so that he can do video visits given the coronavirus and as well be involved while getting his radiation treatment.  Observation Level/Precautions:  15 minute checks   Laboratory:  Chemistry Profile  Psychotherapy:    Medications:    Consultations:    Discharge Concerns:    Estimated LOS:  Other:     Physician Treatment Plan for Primary Diagnosis: <principal problem not specified> Long Term Goal(s): Improvement in symptoms so as ready for discharge  Short Term Goals: Ability to identify changes in lifestyle to reduce recurrence of condition will improve, Ability to verbalize feelings will improve, Ability to disclose and discuss suicidal ideas, Ability to identify and develop effective coping behaviors will improve and Ability to identify triggers associated with substance abuse/mental health issues will improve  Physician Treatment Plan for Secondary Diagnosis: Active Problems:   Major depressive disorder, recurrent severe without psychotic features ([place])   Alcohol use disorder, severe, dependence ([place])   Depression  Long Term Goal(s): Improvement in symptoms so as ready for discharge  Short Term Goals: Ability to identify changes in lifestyle to reduce recurrence of condition will improve, Ability to verbalize feelings will improve, Ability to disclose and discuss suicidal ideas, Ability to identify and develop effective coping behaviors will improve and Ability to identify triggers associated with substance abuse/mental health issues will improve  I certify that inpatient services furnished can reasonably be expected to improve the patient's condition.    [name], MD 8/17/20212:53 PM

## 2019-09-24 NOTE — Progress Notes (Signed)
Discharge Note:  Patient denies SI/HI AVH at this time. Discharge instructions, AVS, prescriptions and transition record gone over with patient. Patient agrees to comply with medication management, follow-up visit, and outpatient therapy. Patient belongings returned to patient. Patient questions and concerns addressed and answered.  Patient ambulatory off unit.  Patient discharged to home with friend.   

## 2019-09-24 NOTE — BHH Suicide Risk Assessment (Signed)
[facility] Discharge Suicide Risk Assessment   Principal Problem: <principal problem not specified> Discharge Diagnoses: Active Problems:   Major depressive disorder, recurrent severe without psychotic features ([place])   Alcohol use disorder, severe, dependence ([place])   Total Time spent with patient: 30 minutes  Musculoskeletal: Strength & Muscle Tone: within normal limits Gait & Station: normal Patient leans: N/A  Psychiatric Specialty Exam: Review of Systems  Genitourinary: Positive for difficulty urinating, dysuria, flank pain, hematuria and urgency.    Blood pressure (!) 137/115, pulse (!) 121, temperature 98.2 F (36.8 C), temperature source Oral, resp. rate 18, height 5\' 7"  (1.702 m), weight 88.9 kg, SpO2 99 %.Body mass index is 30.7 kg/m.  General Appearance: Disheveled  Eye Contact::  Good  Speech:  Normal Rate409  Volume:  Normal  Mood:  Euthymic  Affect:  Congruent  Thought Process:  Coherent and Descriptions of Associations: Circumstantial  Orientation:  Full (Time, Place, and Person)  Thought Content:  Logical  Suicidal Thoughts:  No  Homicidal Thoughts:  No  Memory:  Immediate;   Fair Recent;   Fair Remote;   Fair  Judgement:  Intact  Insight:  Fair  Psychomotor Activity:  Decreased  Concentration:  Fair  Recall:  [employer] of Knowledge:Good  Language: Good  Akathisia:  Negative  Handed:  Right  AIMS (if indicated):     Assets:  Desire for Improvement Housing Resilience Social Support  Sleep:  Number of Hours: 6.75  Cognition: WNL  ADL's:  Intact   Mental Status Per Nursing Assessment::   On Admission:  NA  Demographic Factors:  Male, Low socioeconomic status and Unemployed  Loss Factors: Decline in physical health  Historical Factors: Impulsivity  Risk Reduction Factors:   Living with another person, especially a relative and Positive social support  Continued Clinical Symptoms:  Depression:   Comorbid alcohol  abuse/dependence Impulsivity Insomnia Alcohol/Substance Abuse/Dependencies  Cognitive Features That Contribute To Risk:  None    Suicide Risk:  Minimal: No identifiable suicidal ideation.  Patients presenting with no risk factors but with morbid ruminations; may be classified as minimal risk based on the severity of the depressive symptoms    Plan Of Care/Follow-up recommendations:  Activity:  ad lib  [name], MD 09/24/2019, 9:33 AM

## 2019-09-24 NOTE — Progress Notes (Signed)
NUTRITION ASSESSMENT RD working remotely.  Pt identified as at risk on the Malnutrition Screen Tool  INTERVENTION: - will order Ensure Enlive once/day, each supplement provides 350 kcal and 20 grams of protein. - will order 1 tablet multivitamin with minerals/day.   NUTRITION DIAGNOSIS: Unintentional weight loss related to sub-optimal intake as evidenced by pt report.   Goal: Pt to meet >/= 90% of their estimated nutrition needs.  Monitor:  PO intake  Assessment:  Patient admitted d/t SI and alcohol consumption to cope with severe depression. Patient has been unable to work d/t medical conditions. Notes indicate he has prostate cancer and is undergoing XRT.   Per chart review, weight yesterday was 196 lb and weight on 08/26/19 was 210 lb. This indicates 14 lb weight loss (6.7% body weight) in the past 1 month; significant for time frame.    54 y.o. male  Height: Ht Readings from Last 1 Encounters:  09/23/19 5\' 7"  (1.702 m)    Weight: Wt Readings from Last 1 Encounters:  09/23/19 88.9 kg    Weight Hx: Wt Readings from Last 10 Encounters:  09/23/19 88.9 kg  08/26/19 95.3 kg  08/06/19 95.7 kg  06/02/19 99.8 kg  05/29/19 99.7 kg  05/04/19 97.5 kg  05/04/19 98.9 kg  04/30/19 99 kg  01/24/18 84.8 kg  01/22/18 90.7 kg    BMI:  Body mass index is 30.7 kg/m. Pt meets criteria for obesity based on current BMI.  Estimated Nutritional Needs: Kcal: 25-30 kcal/kg Protein: > 1 gram protein/kg Fluid: 1 ml/kcal  Diet Order:  Diet Order            Diet - low sodium heart healthy                Pt is also offered choice of unit snacks mid-morning and mid-afternoon.  Pt is eating as desired.   Lab results and medications reviewed.     Jarome Matin, MS, RD, LDN, CNSC Inpatient Clinical Dietitian RD pager # available in [place]  After hours/weekend pager # available in [facility]

## 2019-09-24 NOTE — Progress Notes (Signed)
Adult Psychoeducational Group Note  Date:  09/24/2019 Time:  5:16 AM  Group Topic/Focus:  Wrap-Up Group:   The focus of this group is to help patients review their daily goal of treatment and discuss progress on daily workbooks.  Participation Level:  Active  Participation Quality:  Appropriate  Affect:  Appropriate  Cognitive:  Appropriate  Insight: Appropriate  Engagement in Group:  Engaged  Modes of Intervention:  Discussion  Additional Comments:  Pt attend wrap up group his day was a 4. Pt goal was to start to work on his anxiety. His goal was to talk to others. His coping skills he want to tell someone he trust what professional going on with him.   Lenice Llamas Long 09/24/2019, 5:16 AM

## 2019-09-24 NOTE — Discharge Summary (Signed)
Physician Discharge Summary Note  Patient:  Jonathan Austin is an 54 y.o., male MRN:  [id number] DOB:  Nov 09, 1965 Patient phone:  [phone] (home)  Patient address:   [place] [address],  Total Time spent with patient: 1 hour  Date of Admission:  09/23/2019 Date of Discharge: 09/24/2019  Reason for Admission: Suicidal ideation  Principal Problem: <principal problem not specified> Discharge Diagnoses: Active Problems:   Major depressive disorder, recurrent severe without psychotic features ([place])   Alcohol use disorder, severe, dependence ([place])   Depression   Past Psychiatric History: See admission H&P  Past Medical History:  Past Medical History:  Diagnosis Date   Alcohol use disorder, severe, dependence ([place])    05-24-2019 recent [facility] admission for suicide ideation and alcohol / cocaine detox 05-04-2019 in epic,  pt stated has been taking naltrexone daily as prescribed since and no alcohol since   Cocaine abuse ([place])    05-24-2019  pt had recent [facility] admission, pt stated last used  "crack" 05-03-2019   Cocaine abuse with cocaine-induced mood disorder ([place])    Depression    History of suicidal ideation    multiple [facility] admission's;  last one admitted 05-04-2019   Hyperplasia of prostate with lower urinary tract symptoms (LUTS)    Hypertension    followed by pcp   OSA on CPAP    study 10-02-2012 in epic   Phimosis    Prostate cancer [facility]) urologist-- dr winter   dx 02/ 2021;  localized advanced, gleason 4+4, PSA 317   Type 2 diabetes mellitus treated with insulin ([place])    followed by pcp   (05-24-2019  per stated checks blood sugar twice daily, fasting sugar-- 180-200)   Wears glasses     Past Surgical History:  Procedure Laterality Date   CIRCUMCISION N/A 05/29/2019   Procedure: CIRCUMCISION ADULT;  Surgeon: Ceasar Mons, MD;  Location: [facility];  Service: Urology;  Laterality: N/A;   PROSTATE BIOPSY      TONSILLECTOMY  child   TRANSURETHRAL RESECTION OF PROSTATE N/A 05/29/2019   Procedure: TRANSURETHRAL RESECTION OF THE PROSTATE (TURP)/ CYSTOSCOPY;  Surgeon: Ceasar Mons, MD;  Location: [facility];  Service: Urology;  Laterality: N/A;   Family History:  Family History  Problem Relation Age of Onset   Hypertension Mother    Hypertension Father    Diabetes Father    Prostate cancer Maternal Grandfather 41   Breast cancer Neg Hx    Colon cancer Neg Hx    Pancreatic cancer Neg Hx    Family Psychiatric  History: See admission H&P Social History:  Social History   Substance and Sexual Activity  Alcohol Use Yes   Comment: drinks beer 40 oz x 2 daily  (05-24-2019 per pt no alcohol since 05-03-2019)     Social History   Substance and Sexual Activity  Drug Use Yes   Types: Cocaine, Marijuana   Comment: smokes 2g crack daily  (05-24-2019 per pt last crack and marijuana 05-03-2019)    Social History   Socioeconomic History   Marital status: Married    Spouse name: Doris   Number of children: 1   Years of education: Not on file   Highest education level: Not on file  Occupational History   Not on file  Tobacco Use   Smoking status: Never Smoker   Smokeless tobacco: Never Used  Vaping Use   Vaping Use: Never used  Substance and Sexual Activity  Alcohol use: Yes    Comment: drinks beer 40 oz x 2 daily  (05-24-2019 per pt no alcohol since 05-03-2019)   Drug use: Yes    Types: Cocaine, Marijuana    Comment: smokes 2g crack daily  (05-24-2019 per pt last crack and marijuana 05-03-2019)   Sexual activity: Yes  Other Topics Concern   Not on file  Social History Narrative   Only child is deceased   Social Determinants of Radio broadcast assistant Strain:    Difficulty of Paying Living Expenses:   Food Insecurity:    Worried About Charity fundraiser in the Last Year:    Arboriculturist in the Last Year:    Transportation Needs:    Film/video editor (Medical):    Lack of Transportation (Non-Medical):   Physical Activity:    Days of Exercise per Week:    Minutes of Exercise per Session:   Stress:    Feeling of Stress :   Social Connections:    Frequency of Communication with Friends and Family:    Frequency of Social Gatherings with Friends and Family:    Attends Religious Services:    Active Member of Clubs or Organizations:    Attends Archivist Meetings:    Marital Status:     Hospital Course: Patient is seen and examined.  Patient is a 54 year old male familiar to me from a hospitalization earlier this year with history of prostate cancer and depression as well as cocaine dependence and alcohol dependence.  He presented to the [facility] emergency department on 09/21/2019 with worsening depression and suicidal ideation.  His earlier hospitalization this year also related to his recent diagnosis of prostate cancer.  He was admitted to the hospital for evaluation and stabilization.  He was started on mirtazapine 15 mg p.o. nightly on the night of admission.  He was able to sleep well at night.  On examination the next morning he denied any suicidal thoughts.  He was motivated to go to his radiation treatments for his prostate cancer.  He was referred to outpatient substance abuse treatment so that he would be able to do that as well as continuing his radiation treatment for cancer.  Physical Findings: AIMS: Facial and Oral Movements Muscles of Facial Expression: None, normal Lips and Perioral Area: None, normal Jaw: None, normal Tongue: None, normal,Extremity Movements Upper (arms, wrists, hands, fingers): None, normal Lower (legs, knees, ankles, toes): None, normal, Trunk Movements Neck, shoulders, hips: None, normal, Overall Severity Severity of abnormal movements (highest score from questions above): None, normal Incapacitation due to  abnormal movements: None, normal Patient's awareness of abnormal movements (rate only patient's report): No Awareness, Dental Status Current problems with teeth and/or dentures?: Yes Does patient usually wear dentures?: No  CIWA:  CIWA-Ar Total: 2 COWS:     Musculoskeletal: Strength & Muscle Tone: within normal limits Gait & Station: normal Patient leans: N/A  Psychiatric Specialty Exam: Physical Exam Vitals and nursing note reviewed.  Constitutional:      Appearance: Normal appearance.  HENT:     Head: Normocephalic and atraumatic.  Neurological:     General: No focal deficit present.     Mental Status: He is alert and oriented to person, place, and time.     Review of Systems  Blood pressure (!) 137/115, pulse (!) 121, temperature 98.2 F (36.8 C), temperature source Oral, resp. rate 18, height 5\' 7"  (1.702 m), weight 88.9 kg, SpO2 99 %.  Body mass index is 30.7 kg/m.  General Appearance: Casual  Eye Contact:  Fair  Speech:  Normal Rate  Volume:  Normal  Mood:  Euthymic  Affect:  Congruent  Thought Process:  Coherent and Descriptions of Associations: Intact  Orientation:  Full (Time, Place, and Person)  Thought Content:  Logical  Suicidal Thoughts:  No  Homicidal Thoughts:  No  Memory:  Immediate;   Fair Recent;   Fair Remote;   Fair  Judgement:  Intact  Insight:  Fair  Psychomotor Activity:  Normal  Concentration:  Concentration: Fair and Attention Span: Fair  Recall:  [employer] of Knowledge:  Fair  Language:  Fair  Akathisia:  Negative  Handed:  Right  AIMS (if indicated):     Assets:  Desire for Improvement Housing Resilience Social Support  ADL's:  Intact  Cognition:  WNL  Sleep:  Number of Hours: 6.75        Has this patient used any form of tobacco in the last 30 days? (Cigarettes, Smokeless Tobacco, Cigars, and/or Pipes) Yes, No  Blood Alcohol level:  Lab Results  Component Value Date   ETH <10 09/21/2019   ETH 142 (H) 16/11/9602     Metabolic Disorder Labs:  Lab Results  Component Value Date   HGBA1C 11.4 (H) 05/04/2019   MPG 280.48 05/04/2019   MPG 291.96 05/03/2019   No results found for: PROLACTIN Lab Results  Component Value Date   CHOL 265 (H) 08/31/2014   TRIG 351 (H) 08/31/2014   HDL 49 08/31/2014   CHOLHDL 5.4 08/31/2014   VLDL 70 (H) 08/31/2014   LDLCALC 146 (H) 08/31/2014   LDLCALC 163 (H) 05/07/2014    See Psychiatric Specialty Exam and Suicide Risk Assessment completed by Attending Physician prior to discharge.  Discharge destination:  Home  Is patient on multiple antipsychotic therapies at discharge:  No   Has Patient had three or more failed trials of antipsychotic monotherapy by history:  No  Recommended Plan for Multiple Antipsychotic Therapies: NA  Discharge Instructions    Diet - low sodium heart healthy   Complete by: As directed    Increase activity slowly   Complete by: As directed    Increase activity slowly   Complete by: As directed    Increase activity slowly   Complete by: As directed      Allergies as of 09/24/2019   No Known Allergies     Medication List    STOP taking these medications   cholecalciferol 10 MCG (400 UNIT) Tabs tablet Commonly known as: VITAMIN D3   metFORMIN 850 MG tablet Commonly known as: GLUCOPHAGE   naltrexone 50 MG tablet Commonly known as: DEPADE   phenazopyridine 200 MG tablet Commonly known as: Pyridium   tamsulosin 0.4 MG Caps capsule Commonly known as: FLOMAX     TAKE these medications     Indication  abiraterone acetate 250 MG tablet Commonly known as: ZYTIGA Take 4 tablets by mouth daily *Take on an empty stomach 1 hour BEFORE or 2 hours AFTER a meal. What changed: See the new instructions.  Indication: Cancer of Prostate that has Spread to Other Part of Body   acamprosate 333 MG tablet Commonly known as: CAMPRAL Take 2 tablets (666 mg total) by mouth 2 (two) times daily with a meal.  Indication: Abuse or  Misuse of Alcohol   amLODipine 10 MG tablet Commonly known as: NORVASC Take 1 tablet (10 mg total) by mouth daily. For high blood  pressure  Indication: High Blood Pressure Disorder   aspirin 81 MG EC tablet Take 1 tablet (81 mg total) by mouth daily. Swallow whole.  Indication: Antiplatelet   atorvastatin 10 MG tablet Commonly known as: LIPITOR Take 1 tablet (10 mg total) by mouth daily. Start taking on: September 25, 2019  Indication: High Amount of Fats in the Blood   cephALEXin 500 MG capsule Commonly known as: KEFLEX Take 1 capsule (500 mg total) by mouth 2 (two) times daily.  Indication: Acute Bacterial Infection of the Prostate   folic acid 1 MG tablet Commonly known as: FOLVITE Take 1 tablet (1 mg total) by mouth daily. Start taking on: September 25, 2019  Indication: Anemia From Inadequate Folic Acid   glipiZIDE 5 MG tablet Commonly known as: GLUCOTROL Take 1 tablet (5 mg total) by mouth daily before breakfast.  Indication: Type 2 Diabetes   HYDROcodone-acetaminophen 5-325 MG tablet Commonly known as: Norco Take 1 tablet by mouth every 4 (four) hours as needed for moderate pain.  Indication: Pain   LORazepam 1 MG tablet Commonly known as: ATIVAN Take 1 tablet (1 mg total) by mouth every 6 (six) hours as needed (see taper).  Indication: Alcohol Withdrawal Syndrome   mirtazapine 15 MG tablet Commonly known as: REMERON Take 1 tablet (15 mg total) by mouth at bedtime.  Indication: Major Depressive Disorder   multivitamin with minerals Tabs tablet Take 1 tablet by mouth daily. Start taking on: September 25, 2019  Indication: Nutritional Support   NOVOLOG MIX 70/30 [place] Inject 7-12 Units into the skin 2 (two) times daily. Per pt ,  7 units in AM;  12 units in PM  Indication: DM2   ondansetron 4 MG disintegrating tablet Commonly known as: ZOFRAN-ODT Take 1 tablet (4 mg total) by mouth every 6 (six) hours as needed for nausea or vomiting.  Indication: Nausea and  Vomiting caused by Cancer Chemotherapy   thiamine 100 MG tablet Take 1 tablet (100 mg total) by mouth daily. Start taking on: September 25, 2019  Indication: Deficiency of Vitamin B1       Follow-up Information    [facility]. Go on 09/27/2019.   Specialty: Behavioral Health Why: You have an intake appointment for Intensive Outpatient Program (IOP) on Friday 8/20 at 830am. This appointment is in person and following appointments will be virtual.  Contact information: [place] [phone]              Follow-up recommendations:  Activity:  Ad lib.  Comments: Stop using cocaine, stop using alcohol, stop getting illicit substances.  Follow-up with your intensive outpatient program for substance abuse.  Signed: [name], MD 09/24/2019, 2:59 PM

## 2019-09-24 NOTE — BHH Counselor (Signed)
Adult Comprehensive Assessment  Patient ID: Jonathan Austin, male   DOB: 1966-01-02, 54 y.o.   MRN: [id number]  Information Source:    Current Stressors:     Living/Environment/Situation:  Living Arrangements: Spouse/significant other  Family History:     Childhood History:     Education:     Employment/Work Situation:      Museum/gallery curator Resources:      Alcohol/Substance Abuse:      Social Support System:      Leisure/Recreation:      Strengths/Needs:      Discharge Plan:      Summary/Recommendations:   Summary and Recommendations (to be completed by the evaluator): Pt is scheduled for discharge today. Pt is a 54 yr old male with a history of Major depressive disorder, recurrent severe without psychotic features. Chart review states pt came to [facility] due to increased depression and self medicating with drugs and alcohol to manage life stressors. Chart review also states pt has prostate cancer and is receiving radiation. Pt last seen at [facility] in 01/25/2018. Pt agreeable to follow up with [facility]. Patient will benefit from crisis stabilization, medication evaluation, group therapy and psychoeducation, in addition to case management for discharge planning. At discharge it is recommended that patient adhere to the established discharge plan and continue in treatment.  Camilla Skeen T Jennilyn Esteve. 09/24/2019

## 2019-09-24 NOTE — BHH Suicide Risk Assessment (Signed)
[place] INPATIENT:  Family/Significant Other Suicide Prevention Education  Suicide Prevention Education:  Education Completed; Souleymane, Saiki (Spouse)  [phone] (Home Phone),  (name of family member/significant other) has been identified by the patient as the family member/significant other with whom the patient will be residing, and identified as the person(s) who will aid the patient in the event of a mental health crisis (suicidal ideations/suicide attempt).  With written consent from the patient, the family member/significant other has been provided the following suicide prevention education, prior to the and/or following the discharge of the patient.  The suicide prevention education provided includes the following:  Suicide risk factors  Suicide prevention and interventions  National Suicide Hotline telephone number  [facility] assessment telephone number  [name] Emergency Assistance [place] and/or Residential Mobile Crisis Unit telephone number  Request made of family/significant other to:  Remove weapons (e.g., guns, rifles, knives), all items previously/currently identified as safety concern.    Remove drugs/medications (over-the-counter, prescriptions, illicit drugs), all items previously/currently identified as a safety concern.  The family member/significant other verbalizes understanding of the suicide prevention education information provided.  The family member/significant other agrees to remove the items of safety concern listed above.  Bethann Berkshire 09/24/2019, 10:22 AM

## 2019-09-24 NOTE — BHH Suicide Risk Assessment (Signed)
[facility] Admission Suicide Risk Assessment   Nursing information obtained from:  Patient Demographic factors:  Male, Low socioeconomic status Current Mental Status:  NA Loss Factors:  Decline in physical health Historical Factors:  Family history of mental illness or substance abuse, Impulsivity Risk Reduction Factors:  Positive social support  Total Time spent with patient: 30 minutes Principal Problem: <principal problem not specified> Diagnosis:  Active Problems:   Major depressive disorder, recurrent severe without psychotic features ([place])   Alcohol use disorder, severe, dependence ([place])  Subjective Data: Patient is seen and examined.  Patient is a 54 year old male with a past psychiatric history significant for cocaine dependence, alcohol dependence, previous unresolved grief issues and depression who presented originally to the [facility] emergency department on 09/21/2019 with worsening depression and suicidal ideation.  The patient is recently been diagnosed with prostate cancer and has been receiving radiation treatment.  He was also recently started on antibiotics for urinary tract infection.  He stated he was under increasing stress and depression.  He had intermittent thoughts of suicidal ideation.  At the time of the evaluation he reported multiple plans to kill himself including driving his car off the road, overdosing or cutting himself.  He stated that his most significant stressor was his health and he was "very frustrated with himself".  He reported at that time using crack cocaine in various amounts over the last 2 months.  He stated after his last psychiatric hospitalization that he had remained sober for 1 to 2 months afterwards.  He also reported ongoing problems with alcohol and consumes 6-12 beers 3-4 times a week.  He had been seen recently in July for similar circumstances, and was discharged secondary to the fact he did not fulfill criteria for admission.  He  was instructed to follow-up with family services of the [place], but failed to do so.  He continues to live with his wife.  On examination today he is scheduled to have his next radiation treatment this afternoon, the patient and I discussed this.  He feels that the radiation treatment is far more important to him, and shows that he does want to live, that he does not want to kill himself.  Upon discharge from the hospital in March 2021 he was placed on naltrexone 50 mg p.o. daily, but no other psychiatric medications. During his course in the emergency department as well as the [facility] he was placed on mirtazapine 15 mg p.o. nightly last night.  His blood pressure and heart rate were elevated this morning.  His blood pressure was 137/115 his heart rate was 121.  The patient has been treated with amlodipine in the past, but has not been taking that at home.  His blood alcohol on admission was less than 10.  Liver function enzymes were essentially normal.  Continued Clinical Symptoms:  Alcohol Use Disorder Identification Test Final Score (AUDIT): 18 The "Alcohol Use Disorders Identification Test", Guidelines for Use in Primary Care, Second Edition.  World Pharmacologist [facility]). Score between 0-7:  no or low risk or alcohol related problems. Score between 8-15:  moderate risk of alcohol related problems. Score between 16-19:  high risk of alcohol related problems. Score 20 or above:  warrants further diagnostic evaluation for alcohol dependence and treatment.   CLINICAL FACTORS:   Depression:   Anhedonia Comorbid alcohol abuse/dependence Impulsivity Insomnia Alcohol/Substance Abuse/Dependencies   Musculoskeletal: Strength & Muscle Tone: within normal limits Gait & Station: normal Patient leans:  N/A  Psychiatric Specialty Exam: Physical Exam Vitals and nursing note reviewed.  Constitutional:      Appearance: Normal appearance.  HENT:     Head:  Normocephalic and atraumatic.  Pulmonary:     Effort: Pulmonary effort is normal.  Neurological:     General: No focal deficit present.     Mental Status: He is alert and oriented to person, place, and time.     Review of Systems  Blood pressure (!) 137/115, pulse (!) 121, temperature 98.2 F (36.8 C), temperature source Oral, resp. rate 18, height 5\' 7"  (1.702 m), weight 88.9 kg, SpO2 99 %.Body mass index is 30.7 kg/m.  General Appearance: Disheveled  Eye Contact:  Fair  Speech:  Normal Rate  Volume:  Normal  Mood:  Euthymic  Affect:  Congruent  Thought Process:  Coherent and Descriptions of Associations: Intact  Orientation:  Full (Time, Place, and Person)  Thought Content:  Logical  Suicidal Thoughts:  No  Homicidal Thoughts:  No  Memory:  Immediate;   Fair Recent;   Fair Remote;   Fair  Judgement:  Intact  Insight:  Fair  Psychomotor Activity:  Normal  Concentration:  Concentration: Fair and Attention Span: Fair  Recall:  [employer] of Knowledge:  Good  Language:  Good  Akathisia:  Negative  Handed:  Right  AIMS (if indicated):     Assets:  Desire for Improvement Housing Resilience Social Support  ADL's:  Intact  Cognition:  WNL  Sleep:  Number of Hours: 6.75      COGNITIVE FEATURES THAT CONTRIBUTE TO RISK:  None    SUICIDE RISK:   Minimal: No identifiable suicidal ideation.  Patients presenting with no risk factors but with morbid ruminations; may be classified as minimal risk based on the severity of the depressive symptoms  PLAN OF CARE: Patient is seen and examined.  Patient is a 54 year old male who was admitted with suicidal ideation.  He was seen in the emergency department as well as behavioral health urgent care center.  He was started on mirtazapine last night 15 mg p.o. nightly for depression and insomnia.  He is requesting to go to his radiation treatment, and we will discharge him today.  We will also restart his insulin, his amlodipine and is  well to Keflex for his urinary tract infection.  We will also refer him to the intensive outpatient program so that he can do video visits given the coronavirus and as well be involved while getting his radiation treatment.  I certify that inpatient services furnished can reasonably be expected to improve the patient's condition.   [name], MD 09/24/2019, 9:24 AM

## 2019-09-24 NOTE — Progress Notes (Signed)
  [facility] Adult Case Management Discharge Plan :  Will you be returning to the same living situation after discharge:  Yes,  home with wife At discharge, do you have transportation home?: Yes,  wife Do you have the ability to pay for your medications: Yes,  [facility]  Release of information consent forms completed and in the chart;  Patient's signature needed at discharge.  Patient to Follow up at:  Follow-up Information    [facility]. Go on 09/27/2019.   Specialty: Behavioral Health Why: You have an intake appointment for Intensive Outpatient Program (IOP) on Friday 8/20 at 830am. This appointment is in person and following appointments will be virtual.  Contact information: [place] [phone]              Next level of care provider has access to [place] and Suicide Prevention discussed: Yes,  with wife     Has patient been referred to the Quitline?: N/A patient is not a smoker  Patient has been referred for addiction treatment: Yes  Bethann Berkshire, [place] 09/24/2019, 10:33 AM

## 2019-09-25 ENCOUNTER — Ambulatory Visit
Admission: RE | Admit: 2019-09-25 | Discharge: 2019-09-25 | Disposition: A | Discharge status: Home / Self Care | Payer: 59 | Source: Ambulatory Visit | Attending: Radiation Oncology | Admitting: Radiation Oncology

## 2019-09-25 ENCOUNTER — Other Ambulatory Visit: Payer: Self-pay

## 2019-09-25 DIAGNOSIS — C61 Malignant neoplasm of prostate: Secondary | ICD-10-CM | POA: Diagnosis present

## 2019-09-25 DIAGNOSIS — Z510 Encounter for antineoplastic radiation therapy: Secondary | ICD-10-CM | POA: Diagnosis not present

## 2019-09-25 DIAGNOSIS — R599 Enlarged lymph nodes, unspecified: Secondary | ICD-10-CM | POA: Diagnosis present

## 2019-09-26 ENCOUNTER — Other Ambulatory Visit: Payer: Self-pay

## 2019-09-26 ENCOUNTER — Ambulatory Visit
Admission: RE | Admit: 2019-09-26 | Discharge: 2019-09-26 | Disposition: A | Discharge status: Home / Self Care | Payer: 59 | Source: Ambulatory Visit | Attending: Radiation Oncology | Admitting: Radiation Oncology

## 2019-09-26 ENCOUNTER — Ambulatory Visit: Payer: Self-pay | Admitting: Nurse Practitioner

## 2019-09-26 DIAGNOSIS — Z510 Encounter for antineoplastic radiation therapy: Secondary | ICD-10-CM | POA: Diagnosis not present

## 2019-09-27 ENCOUNTER — Other Ambulatory Visit: Payer: Self-pay

## 2019-09-27 ENCOUNTER — Ambulatory Visit
Admission: RE | Admit: 2019-09-27 | Discharge: 2019-09-27 | Disposition: A | Discharge status: Home / Self Care | Payer: 59 | Source: Ambulatory Visit | Attending: Radiation Oncology | Admitting: Radiation Oncology

## 2019-09-27 ENCOUNTER — Ambulatory Visit (HOSPITAL_COMMUNITY): Payer: 59 | Admitting: Licensed Clinical Social Worker

## 2019-09-27 DIAGNOSIS — Z510 Encounter for antineoplastic radiation therapy: Secondary | ICD-10-CM | POA: Diagnosis not present

## 2019-09-30 ENCOUNTER — Ambulatory Visit
Admission: RE | Admit: 2019-09-30 | Discharge: 2019-09-30 | Disposition: A | Discharge status: Home / Self Care | Payer: 59 | Source: Ambulatory Visit | Attending: Radiation Oncology | Admitting: Radiation Oncology

## 2019-09-30 ENCOUNTER — Other Ambulatory Visit: Payer: Self-pay

## 2019-09-30 DIAGNOSIS — Z510 Encounter for antineoplastic radiation therapy: Secondary | ICD-10-CM | POA: Diagnosis not present

## 2019-10-01 ENCOUNTER — Other Ambulatory Visit: Payer: Self-pay

## 2019-10-01 ENCOUNTER — Ambulatory Visit
Admission: RE | Admit: 2019-10-01 | Discharge: 2019-10-01 | Disposition: A | Discharge status: Home / Self Care | Payer: 59 | Source: Ambulatory Visit | Attending: Radiation Oncology | Admitting: Radiation Oncology

## 2019-10-01 ENCOUNTER — Ambulatory Visit: Payer: 59

## 2019-10-01 DIAGNOSIS — Z510 Encounter for antineoplastic radiation therapy: Secondary | ICD-10-CM | POA: Diagnosis not present

## 2019-10-02 ENCOUNTER — Ambulatory Visit
Admission: RE | Admit: 2019-10-02 | Discharge: 2019-10-02 | Disposition: A | Discharge status: Home / Self Care | Payer: 59 | Source: Ambulatory Visit | Attending: Radiation Oncology | Admitting: Radiation Oncology

## 2019-10-02 ENCOUNTER — Ambulatory Visit: Payer: 59

## 2019-10-02 ENCOUNTER — Other Ambulatory Visit: Payer: Self-pay

## 2019-10-02 DIAGNOSIS — Z510 Encounter for antineoplastic radiation therapy: Secondary | ICD-10-CM | POA: Diagnosis not present

## 2019-10-03 ENCOUNTER — Ambulatory Visit
Admission: RE | Admit: 2019-10-03 | Discharge: 2019-10-03 | Disposition: A | Discharge status: Home / Self Care | Payer: 59 | Source: Ambulatory Visit | Attending: Radiation Oncology | Admitting: Radiation Oncology

## 2019-10-03 ENCOUNTER — Ambulatory Visit: Payer: 59

## 2019-10-03 ENCOUNTER — Other Ambulatory Visit: Payer: Self-pay

## 2019-10-03 DIAGNOSIS — Z510 Encounter for antineoplastic radiation therapy: Secondary | ICD-10-CM | POA: Diagnosis not present

## 2019-10-04 ENCOUNTER — Ambulatory Visit: Payer: 59

## 2019-10-04 ENCOUNTER — Other Ambulatory Visit: Payer: Self-pay

## 2019-10-04 ENCOUNTER — Ambulatory Visit
Admission: RE | Admit: 2019-10-04 | Discharge: 2019-10-04 | Disposition: A | Discharge status: Home / Self Care | Payer: 59 | Source: Ambulatory Visit | Attending: Radiation Oncology | Admitting: Radiation Oncology

## 2019-10-04 DIAGNOSIS — Z510 Encounter for antineoplastic radiation therapy: Secondary | ICD-10-CM | POA: Diagnosis not present

## 2019-10-07 ENCOUNTER — Ambulatory Visit: Payer: 59

## 2019-10-07 ENCOUNTER — Ambulatory Visit
Admission: RE | Admit: 2019-10-07 | Discharge: 2019-10-07 | Disposition: A | Discharge status: Home / Self Care | Payer: 59 | Source: Ambulatory Visit | Attending: Radiation Oncology | Admitting: Radiation Oncology

## 2019-10-07 DIAGNOSIS — Z510 Encounter for antineoplastic radiation therapy: Secondary | ICD-10-CM | POA: Diagnosis not present

## 2019-10-07 MED FILL — Cephalexin Cap 500 MG: ORAL | Qty: 500 | Status: AC

## 2019-10-07 MED FILL — Loperamide HCl Cap 2 MG: ORAL | Qty: 2 | Status: AC

## 2019-10-07 MED FILL — Escitalopram Oxalate Tab 10 MG (Base Equiv): ORAL | Qty: 5 | Status: AC

## 2019-10-08 ENCOUNTER — Ambulatory Visit: Payer: 59

## 2019-10-09 ENCOUNTER — Telehealth: Payer: Self-pay | Admitting: Radiation Oncology

## 2019-10-09 ENCOUNTER — Ambulatory Visit
Admission: RE | Admit: 2019-10-09 | Discharge: 2019-10-09 | Disposition: A | Discharge status: Home / Self Care | Payer: 59 | Source: Ambulatory Visit | Attending: Radiation Oncology | Admitting: Radiation Oncology

## 2019-10-09 ENCOUNTER — Inpatient Hospital Stay: Payer: 59

## 2019-10-09 ENCOUNTER — Inpatient Hospital Stay: Payer: 59 | Attending: Oncology | Admitting: Oncology

## 2019-10-09 ENCOUNTER — Other Ambulatory Visit: Payer: Self-pay

## 2019-10-09 VITALS — BP 112/75 | HR 95 | Temp 97.5°F | Resp 17 | Ht 67.0 in | Wt 197.7 lb

## 2019-10-09 DIAGNOSIS — R399 Unspecified symptoms and signs involving the genitourinary system: Secondary | ICD-10-CM | POA: Insufficient documentation

## 2019-10-09 DIAGNOSIS — E119 Type 2 diabetes mellitus without complications: Secondary | ICD-10-CM | POA: Insufficient documentation

## 2019-10-09 DIAGNOSIS — R599 Enlarged lymph nodes, unspecified: Secondary | ICD-10-CM | POA: Insufficient documentation

## 2019-10-09 DIAGNOSIS — C61 Malignant neoplasm of prostate: Secondary | ICD-10-CM | POA: Insufficient documentation

## 2019-10-09 DIAGNOSIS — Z510 Encounter for antineoplastic radiation therapy: Secondary | ICD-10-CM | POA: Insufficient documentation

## 2019-10-09 DIAGNOSIS — E291 Testicular hypofunction: Secondary | ICD-10-CM | POA: Insufficient documentation

## 2019-10-09 DIAGNOSIS — D72819 Decreased white blood cell count, unspecified: Secondary | ICD-10-CM | POA: Insufficient documentation

## 2019-10-09 LAB — CBC WITH DIFFERENTIAL (CANCER CENTER ONLY)
Abs Immature Granulocytes: 0.02 10*3/uL (ref 0.00–0.07)
Basophils Absolute: 0 10*3/uL (ref 0.0–0.1)
Basophils Relative: 1 %
Eosinophils Absolute: 0.1 10*3/uL (ref 0.0–0.5)
Eosinophils Relative: 2 %
HCT: 39.9 % (ref 39.0–52.0)
Hemoglobin: 13.9 g/dL (ref 13.0–17.0)
Immature Granulocytes: 1 %
Lymphocytes Relative: 15 %
Lymphs Abs: 0.4 10*3/uL — ABNORMAL LOW (ref 0.7–4.0)
MCH: 30.4 pg (ref 26.0–34.0)
MCHC: 34.8 g/dL (ref 30.0–36.0)
MCV: 87.3 fL (ref 80.0–100.0)
Monocytes Absolute: 0.5 10*3/uL (ref 0.1–1.0)
Monocytes Relative: 19 %
Neutro Abs: 1.7 10*3/uL (ref 1.7–7.7)
Neutrophils Relative %: 62 %
Platelet Count: 249 10*3/uL (ref 150–400)
RBC: 4.57 MIL/uL (ref 4.22–5.81)
RDW: 12.8 % (ref 11.5–15.5)
WBC Count: 2.8 10*3/uL — ABNORMAL LOW (ref 4.0–10.5)
nRBC: 0 % (ref 0.0–0.2)

## 2019-10-09 LAB — CMP (CANCER CENTER ONLY)
ALT: 17 U/L (ref 0–44)
AST: 16 U/L (ref 15–41)
Albumin: 3.3 g/dL — ABNORMAL LOW (ref 3.5–5.0)
Alkaline Phosphatase: 61 U/L (ref 38–126)
Anion gap: 8 (ref 5–15)
BUN: 7 mg/dL (ref 6–20)
CO2: 28 mmol/L (ref 22–32)
Calcium: 9.6 mg/dL (ref 8.9–10.3)
Chloride: 100 mmol/L (ref 98–111)
Creatinine: 1.34 mg/dL — ABNORMAL HIGH (ref 0.61–1.24)
GFR, Est AFR Am: >60 mL/min (ref >60)
GFR, Estimated: 60 mL/min — ABNORMAL LOW (ref >60)
Glucose, Bld: 427 mg/dL — ABNORMAL HIGH (ref 70–99)
Potassium: 4.1 mmol/L (ref 3.5–5.1)
Sodium: 136 mmol/L (ref 135–145)
Total Bilirubin: 0.3 mg/dL (ref 0.3–1.2)
Total Protein: 6.5 g/dL (ref 6.5–8.1)

## 2019-10-09 NOTE — Progress Notes (Signed)
Hematology and Oncology Follow Up Visit  Jonathan Austin [id number] 09/04/1965 54 y.o. 10/09/2019 2:49 PM [facility]   Principle Diagnosis: 54 year old man with advanced prostate cancer presented with pelvic lymph node involvement diagnosed in February 2021.  His Gleason score 4+4 = 8 and PSA was 266 of the time of diagnosis.   Prior Therapy:  He is status post prostate biopsy completed in February 2021.  He is status post cystoscopy and TURP procedure by Dr. Lovena Neighbours in April 2021.    Current therapy:  [facility]ligard under [facility] care of [facility].   Radiation therapy to the prostate and pelvis currently ongoing.  Zytiga 1000 mg daily started in July 2021.  Interim History: Jonathan Austin is here for repeat evaluation.  Since the last visit, he reports no major changes in his health.  He has tolerated radiation therapy reasonably well without any major complications.  He does report some scrotal discomfort and pelvic pain at times but no hematuria or dysuria.  He does report some occasional frequency.  He denies any complications related to Zytiga.  Denies any nausea, fatigue or edema.  His performance status to be level remained excellent.    Medications: Updated on review. Current Outpatient Medications  Medication Sig Dispense Refill  . abiraterone acetate (ZYTIGA) 250 MG tablet Take 4 tablets by mouth daily *Take on an empty stomach 1 hour BEFORE or 2 hours AFTER a meal. (Patient taking differently: Take 1,000 mg by mouth daily. ) 120 tablet 0  . acamprosate (CAMPRAL) 333 MG tablet Take 2 tablets (666 mg total) by mouth 2 (two) times daily with a meal. 60 tablet 0  . amLODipine (NORVASC) 10 MG tablet Take 1 tablet (10 mg total) by mouth daily. For high blood pressure (Patient not taking: Reported on 08/25/2019) 30 tablet 0  . aspirin 81 MG EC tablet Take 1 tablet (81 mg total) by mouth daily. Swallow whole. (Patient not taking: Reported  on 08/25/2019) 30 tablet 0  . atorvastatin (LIPITOR) 10 MG tablet Take 1 tablet (10 mg total) by mouth daily. 30 tablet 0  . cephALEXin (KEFLEX) 500 MG capsule Take 1 capsule (500 mg total) by mouth 2 (two) times daily. 10 capsule 0  . folic acid (FOLVITE) 1 MG tablet Take 1 tablet (1 mg total) by mouth daily. 30 tablet 0  . glipiZIDE (GLUCOTROL) 5 MG tablet Take 1 tablet (5 mg total) by mouth daily before breakfast. (Patient not taking: Reported on 08/25/2019) 30 tablet 0  . HYDROcodone-acetaminophen (NORCO) 5-325 MG tablet Take 1 tablet by mouth every 4 (four) hours as needed for moderate pain. (Patient not taking: Reported on 08/25/2019) 20 tablet 0  . Insulin Aspart Prot & Aspart (NOVOLOG MIX 70/30 [place]) Inject 7-12 Units into the skin 2 (two) times daily. Per pt ,  7 units in AM;  12 units in PM    . LORazepam (ATIVAN) 1 MG tablet Take 1 tablet (1 mg total) by mouth every 6 (six) hours as needed (see taper). 30 tablet 0  . mirtazapine (REMERON) 15 MG tablet Take 1 tablet (15 mg total) by mouth at bedtime. 30 tablet 0  . Multiple Vitamin (MULTIVITAMIN WITH MINERALS) TABS tablet Take 1 tablet by mouth daily. 30 tablet 0  . ondansetron (ZOFRAN-ODT) 4 MG disintegrating tablet Take 1 tablet (4 mg total) by mouth every 6 (six) hours as needed for nausea or vomiting. 20 tablet 0  . thiamine 100 MG tablet Take  1 tablet (100 mg total) by mouth daily. 30 tablet 0   No current facility-administered medications for this visit.     Allergies: No Known Allergies   Physical Exam: Blood pressure 112/75, pulse 95, temperature (!) 97.5 F (36.4 C), temperature source Tympanic, resp. rate 17, height 5\' 7"  (1.702 m), weight 197 lb 11.2 oz (89.7 kg), SpO2 100 %.    ECOG: 0   General appearance: Comfortable appearing without any discomfort Head: Normocephalic without any trauma Oropharynx: Mucous membranes are moist and pink without any thrush or ulcers. Eyes: Pupils are equal and round reactive to  light. Lymph nodes: No cervical, supraclavicular, inguinal or axillary lymphadenopathy.   Heart:regular rate and rhythm.  S1 and S2 without leg edema. Lung: Clear without any rhonchi or wheezes.  No dullness to percussion. Abdomin: Soft, nontender, nondistended with good bowel sounds.  No hepatosplenomegaly. Musculoskeletal: No joint deformity or effusion.  Full range of motion noted. Neurological: No deficits noted on motor, sensory and deep tendon reflex exam. Skin: No petechial rash or dryness.  Appeared moist.      Lab Results: Lab Results  Component Value Date   WBC 3.5 (L) 09/21/2019   HGB 15.2 09/21/2019   HCT 44.1 09/21/2019   MCV 88.2 09/21/2019   PLT 221 09/21/2019     Chemistry      Component Value Date/Time   NA 129 (L) 09/21/2019 0504   K 3.9 09/21/2019 0504   CL 95 (L) 09/21/2019 0504   CO2 24 09/21/2019 0504   BUN 12 09/21/2019 0504   CREATININE 0.95 09/21/2019 0504      Component Value Date/Time   CALCIUM 8.6 (L) 09/21/2019 0504   ALKPHOS 54 09/21/2019 0504   AST 12 (L) 09/21/2019 0504   ALT 17 09/21/2019 0504   BILITOT 0.9 09/21/2019 0504         Impression and Plan:   54 year old man with:  1.  Advanced prostate cancer with lymphadenopathy diagnosed in February 2021.  He has castration-sensitive disease.  He is currently receiving Zytiga in addition to androgen deprivation and has tolerated very well.  PSA is currently pending but I anticipate improvement in his overall count as he is getting effective treatment.  Risks and benefits of continuing Zytiga long-term were discussed.  Complications including hypertension, renal sufficiency, hypokalemia among others were reviewed.  After discussion today he is agreeable to continue.  The duration of therapy is to be determined.  Lifetime treatment given his advanced disease is the standard of care.  He has a complete response therapy discontinuation would be considered in 2 years given the fact that he  received definitive therapy.   2.    Leukocytopenia: Appears to be chronic in nature and likely related to medication and possibly element of marrow toxicity from radiation.  We will continue to monitor future.   3.  Androgen deprivation: He is currently receiving that under the care of Dr. Lovena Neighbours.  Next injection will be October and will be repeated in 6 months.   4.   Follow-up: He will return in 3 months to follow his progress.   30  minutes were dedicated to this visit.  The time was spent on reviewing his disease status, reviewing treatment options and outlining future plan of care.     Zola Button, MD 9/1/20212:49 PM

## 2019-10-09 NOTE — Telephone Encounter (Signed)
Informed by radiation therapist via email that patient did not present for treatment yesterday. Per [name], RN she received a voicemail from the patient cancelling treatment due to severe scrotal pain. Phoned patient to inquire. No answer. Mailbox full. Informed Mel, RT on L2 of this.

## 2019-10-10 ENCOUNTER — Ambulatory Visit
Admission: RE | Admit: 2019-10-10 | Discharge: 2019-10-10 | Disposition: A | Discharge status: Home / Self Care | Payer: 59 | Source: Ambulatory Visit | Attending: Radiation Oncology | Admitting: Radiation Oncology

## 2019-10-10 ENCOUNTER — Telehealth: Payer: Self-pay

## 2019-10-10 LAB — PROSTATE-SPECIFIC AG, SERUM (LABCORP): Prostate Specific Ag, Serum: 153 ng/mL — ABNORMAL HIGH (ref 0.0–4.0)

## 2019-10-10 NOTE — Telephone Encounter (Signed)
Called patient to give PSA results per Dr Alen Blew. Patient verbalized understanding.

## 2019-10-10 NOTE — Telephone Encounter (Signed)
-----   Message from Wyatt Portela, MD sent at 10/10/2019  1:44 PM EDT ----- Please let him know his PSA is down

## 2019-10-11 ENCOUNTER — Ambulatory Visit: Payer: 59

## 2019-10-15 ENCOUNTER — Ambulatory Visit
Admission: RE | Admit: 2019-10-15 | Discharge: 2019-10-15 | Disposition: A | Discharge status: Home / Self Care | Payer: 59 | Source: Ambulatory Visit | Attending: Radiation Oncology | Admitting: Radiation Oncology

## 2019-10-16 ENCOUNTER — Ambulatory Visit
Admission: RE | Admit: 2019-10-16 | Discharge: 2019-10-16 | Disposition: A | Discharge status: Home / Self Care | Payer: 59 | Source: Ambulatory Visit | Attending: Radiation Oncology | Admitting: Radiation Oncology

## 2019-10-17 ENCOUNTER — Ambulatory Visit
Admission: RE | Admit: 2019-10-17 | Discharge: 2019-10-17 | Disposition: A | Discharge status: Home / Self Care | Payer: 59 | Source: Ambulatory Visit | Attending: Radiation Oncology | Admitting: Radiation Oncology

## 2019-10-17 ENCOUNTER — Other Ambulatory Visit: Payer: Self-pay

## 2019-10-18 ENCOUNTER — Ambulatory Visit
Admission: RE | Admit: 2019-10-18 | Discharge: 2019-10-18 | Disposition: A | Discharge status: Home / Self Care | Payer: 59 | Source: Ambulatory Visit | Attending: Radiation Oncology | Admitting: Radiation Oncology

## 2019-10-21 ENCOUNTER — Ambulatory Visit
Admission: RE | Admit: 2019-10-21 | Discharge: 2019-10-21 | Disposition: A | Discharge status: Home / Self Care | Payer: 59 | Source: Ambulatory Visit | Attending: Radiation Oncology | Admitting: Radiation Oncology

## 2019-10-21 ENCOUNTER — Other Ambulatory Visit: Payer: Self-pay

## 2019-10-22 ENCOUNTER — Ambulatory Visit: Payer: 59

## 2019-10-22 ENCOUNTER — Ambulatory Visit
Admission: RE | Admit: 2019-10-22 | Discharge: 2019-10-22 | Disposition: A | Discharge status: Home / Self Care | Payer: 59 | Source: Ambulatory Visit | Attending: Radiation Oncology | Admitting: Radiation Oncology

## 2019-10-22 ENCOUNTER — Other Ambulatory Visit: Payer: Self-pay

## 2019-10-23 ENCOUNTER — Ambulatory Visit: Payer: 59

## 2019-10-23 ENCOUNTER — Other Ambulatory Visit: Payer: Self-pay

## 2019-10-23 ENCOUNTER — Ambulatory Visit
Admission: RE | Admit: 2019-10-23 | Discharge: 2019-10-23 | Disposition: A | Discharge status: Home / Self Care | Payer: 59 | Source: Ambulatory Visit | Attending: Radiation Oncology | Admitting: Radiation Oncology

## 2019-10-24 ENCOUNTER — Ambulatory Visit: Payer: 59

## 2019-10-25 ENCOUNTER — Ambulatory Visit
Admission: RE | Admit: 2019-10-25 | Discharge: 2019-10-25 | Disposition: A | Discharge status: Home / Self Care | Payer: 59 | Source: Ambulatory Visit | Attending: Radiation Oncology | Admitting: Radiation Oncology

## 2019-10-25 ENCOUNTER — Ambulatory Visit: Payer: 59

## 2019-10-25 ENCOUNTER — Other Ambulatory Visit: Payer: Self-pay

## 2019-10-28 ENCOUNTER — Other Ambulatory Visit: Payer: Self-pay

## 2019-10-28 ENCOUNTER — Ambulatory Visit: Payer: 59

## 2019-10-28 ENCOUNTER — Ambulatory Visit
Admission: RE | Admit: 2019-10-28 | Discharge: 2019-10-28 | Disposition: A | Discharge status: Home / Self Care | Payer: 59 | Source: Ambulatory Visit | Attending: Radiation Oncology | Admitting: Radiation Oncology

## 2019-10-29 ENCOUNTER — Other Ambulatory Visit: Payer: Self-pay

## 2019-10-29 ENCOUNTER — Ambulatory Visit
Admission: RE | Admit: 2019-10-29 | Discharge: 2019-10-29 | Disposition: A | Discharge status: Home / Self Care | Payer: 59 | Source: Ambulatory Visit | Attending: Radiation Oncology | Admitting: Radiation Oncology

## 2019-10-29 ENCOUNTER — Ambulatory Visit: Payer: 59

## 2019-10-30 ENCOUNTER — Ambulatory Visit: Payer: 59

## 2019-10-30 ENCOUNTER — Ambulatory Visit
Admission: RE | Admit: 2019-10-30 | Discharge: 2019-10-30 | Disposition: A | Discharge status: Home / Self Care | Payer: 59 | Source: Ambulatory Visit | Attending: Radiation Oncology | Admitting: Radiation Oncology

## 2019-10-30 ENCOUNTER — Other Ambulatory Visit: Payer: Self-pay

## 2019-10-31 ENCOUNTER — Ambulatory Visit
Admission: RE | Admit: 2019-10-31 | Discharge: 2019-10-31 | Disposition: A | Discharge status: Home / Self Care | Payer: 59 | Source: Ambulatory Visit | Attending: Radiation Oncology | Admitting: Radiation Oncology

## 2019-10-31 ENCOUNTER — Ambulatory Visit: Payer: 59

## 2019-10-31 ENCOUNTER — Other Ambulatory Visit: Payer: Self-pay

## 2019-11-01 ENCOUNTER — Other Ambulatory Visit: Payer: Self-pay

## 2019-11-01 ENCOUNTER — Encounter: Payer: Self-pay | Admitting: Urology

## 2019-11-01 ENCOUNTER — Ambulatory Visit
Admission: RE | Admit: 2019-11-01 | Discharge: 2019-11-01 | Disposition: A | Discharge status: Home / Self Care | Payer: 59 | Source: Ambulatory Visit | Attending: Radiation Oncology | Admitting: Radiation Oncology

## 2019-11-01 ENCOUNTER — Encounter: Payer: Self-pay | Admitting: Medical Oncology

## 2019-11-06 ENCOUNTER — Ambulatory Visit: Payer: Self-pay | Admitting: Nurse Practitioner

## 2019-12-04 ENCOUNTER — Telehealth: Payer: Self-pay | Admitting: *Deleted

## 2019-12-04 NOTE — Telephone Encounter (Signed)
CALLED PATIENT TO OFFER NEW FU APPT. DUE TO PATIENT NOT BEING TO TALK ON 12-05-19, RESCHEDULED FOR 01-09-20, PATIENT AGREED TO TIME AND DATE

## 2019-12-05 ENCOUNTER — Ambulatory Visit: Admission: RE | Admit: 2019-12-05 | Payer: 59 | Source: Ambulatory Visit | Admitting: Urology

## 2019-12-19 ENCOUNTER — Encounter: Payer: Self-pay | Admitting: Medical Oncology

## 2019-12-19 NOTE — Progress Notes (Signed)
Left message, I called Dr. Jackson Latino office to reschedule missed appointment and was told he will need to call the business office before he can be rescheduled.  I asked him to call me back to confirm he received this message.
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
## 2019-12-19 NOTE — Progress Notes (Signed)
Spoke with patient and his wife to let them know they need to call the business office at [facility] before appointment with Dr. Lovena Neighbours can be rescheduled.I gave them the telephone number and they voiced understanding.

## 2019-12-24 ENCOUNTER — Other Ambulatory Visit: Payer: Self-pay

## 2019-12-24 ENCOUNTER — Encounter (HOSPITAL_COMMUNITY): Payer: Self-pay

## 2019-12-24 ENCOUNTER — Emergency Department (HOSPITAL_COMMUNITY)
Admission: EM | Admit: 2019-12-24 | Discharge: 2019-12-25 | Disposition: A | Discharge status: Home / Self Care | Payer: 59 | Attending: Emergency Medicine | Admitting: Emergency Medicine

## 2019-12-24 DIAGNOSIS — I10 Essential (primary) hypertension: Secondary | ICD-10-CM | POA: Insufficient documentation

## 2019-12-24 DIAGNOSIS — R109 Unspecified abdominal pain: Secondary | ICD-10-CM | POA: Insufficient documentation

## 2019-12-24 DIAGNOSIS — R1031 Right lower quadrant pain: Secondary | ICD-10-CM

## 2019-12-24 DIAGNOSIS — Z79899 Other long term (current) drug therapy: Secondary | ICD-10-CM | POA: Insufficient documentation

## 2019-12-24 DIAGNOSIS — Z8546 Personal history of malignant neoplasm of prostate: Secondary | ICD-10-CM | POA: Insufficient documentation

## 2019-12-24 DIAGNOSIS — E119 Type 2 diabetes mellitus without complications: Secondary | ICD-10-CM | POA: Insufficient documentation

## 2019-12-24 DIAGNOSIS — Z794 Long term (current) use of insulin: Secondary | ICD-10-CM | POA: Insufficient documentation

## 2019-12-24 DIAGNOSIS — Z7984 Long term (current) use of oral hypoglycemic drugs: Secondary | ICD-10-CM | POA: Insufficient documentation

## 2019-12-24 DIAGNOSIS — Z7982 Long term (current) use of aspirin: Secondary | ICD-10-CM | POA: Insufficient documentation

## 2019-12-24 LAB — CBC WITH DIFFERENTIAL/PLATELET
Abs Immature Granulocytes: 0.02 10*3/uL (ref 0.00–0.07)
Basophils Absolute: 0 10*3/uL (ref 0.0–0.1)
Basophils Relative: 1 %
Eosinophils Absolute: 0.1 10*3/uL (ref 0.0–0.5)
Eosinophils Relative: 2 %
HCT: 37.7 % — ABNORMAL LOW (ref 39.0–52.0)
Hemoglobin: 12.7 g/dL — ABNORMAL LOW (ref 13.0–17.0)
Immature Granulocytes: 1 %
Lymphocytes Relative: 12 %
Lymphs Abs: 0.4 10*3/uL — ABNORMAL LOW (ref 0.7–4.0)
MCH: 31 pg (ref 26.0–34.0)
MCHC: 33.7 g/dL (ref 30.0–36.0)
MCV: 92 fL (ref 80.0–100.0)
Monocytes Absolute: 0.5 10*3/uL (ref 0.1–1.0)
Monocytes Relative: 16 %
Neutro Abs: 2.1 10*3/uL (ref 1.7–7.7)
Neutrophils Relative %: 68 %
Platelets: 296 10*3/uL (ref 150–400)
RBC: 4.1 MIL/uL — ABNORMAL LOW (ref 4.22–5.81)
RDW: 12.6 % (ref 11.5–15.5)
WBC: 3 10*3/uL — ABNORMAL LOW (ref 4.0–10.5)
nRBC: 0 % (ref 0.0–0.2)

## 2019-12-24 LAB — BASIC METABOLIC PANEL
Anion gap: 10 (ref 5–15)
BUN: 19 mg/dL (ref 6–20)
CO2: 26 mmol/L (ref 22–32)
Calcium: 9.1 mg/dL (ref 8.9–10.3)
Chloride: 99 mmol/L (ref 98–111)
Creatinine, Ser: 1.04 mg/dL (ref 0.61–1.24)
GFR, Estimated: >60 mL/min (ref >60)
Glucose, Bld: 215 mg/dL — ABNORMAL HIGH (ref 70–99)
Potassium: 4.2 mmol/L (ref 3.5–5.1)
Sodium: 135 mmol/L (ref 135–145)

## 2019-12-24 LAB — URINALYSIS, ROUTINE W REFLEX MICROSCOPIC
Bacteria, UA: NONE SEEN
Bilirubin Urine: NEGATIVE
Glucose, UA: >=500 mg/dL — AB
Hgb urine dipstick: NEGATIVE
Ketones, ur: NEGATIVE mg/dL
Leukocytes,Ua: NEGATIVE
Nitrite: NEGATIVE
Protein, ur: NEGATIVE mg/dL
Specific Gravity, Urine: 1.018 (ref 1.005–1.030)
pH: 7 (ref 5.0–8.0)

## 2019-12-24 MED ORDER — MORPHINE SULFATE (PF) 4 MG/ML IV SOLN
4.0000 mg | Freq: Once | INTRAVENOUS | Status: AC
  Administered 2019-12-24: 4 mg via INTRAVENOUS
  Filled 2019-12-24: qty 1

## 2019-12-24 NOTE — ED Provider Notes (Signed)
[facility] DEPT Provider Note   CSN: [id number] Arrival date & time: 12/24/19  2223     History Chief Complaint  Patient presents with  . Lower groin pain    Jonathan Austin is a 54 y.o. male.  Patient with history of prostate cancer s/p TURP receiving chemo and just finished radiation presents with groin pain that he associates with his prostate condition. No fever. He is able to urinate, no hematuria or difficulty passing urine. No groin swelling and no abdominal pain. He states he ran out of his hydrocodone yesterday.  The history is provided by the patient. No language interpreter was used.       Past Medical History:  Diagnosis Date  . Alcohol use disorder, severe, dependence ([place])    05-24-2019 recent [facility] admission for suicide ideation and alcohol / cocaine detox 05-04-2019 in epic,  pt stated has been taking naltrexone daily as prescribed since and no alcohol since  . Cocaine abuse ([place])    05-24-2019  pt had recent [facility] admission, pt stated last used  "crack" 05-03-2019  . Cocaine abuse with cocaine-induced mood disorder ([place])   . Depression   . History of suicidal ideation    multiple [place] [facility] admission's;  last one admitted 05-04-2019  . Hyperplasia of prostate with lower urinary tract symptoms (LUTS)   . Hypertension    followed by pcp  . OSA on CPAP    study 10-02-2012 in epic  . Phimosis   . Prostate cancer [facility]) urologist-- dr winter   dx 02/ 2021;  localized advanced, gleason 4+4, PSA 317  . Type 2 diabetes mellitus treated with insulin ([place])    followed by pcp   (05-24-2019  per stated checks blood sugar twice daily, fasting sugar-- 180-200)  . Wears glasses     Patient Active Problem List   Diagnosis Date Noted  . Depression 09/24/2019  . Malignant neoplasm of prostate ([place]) 04/30/2019  . Polysubstance dependence including opioid drug with daily use ([place]) 03/16/2017  . Cocaine use disorder, severe, dependence ([place])  07/19/2016  . Alcohol use disorder, severe, dependence ([place]) 07/16/2016  . Thoracic aortic atherosclerosis ([place]) 05/09/2016  . Major depressive disorder, recurrent severe without psychotic features ([place]) 02/21/2016  . Renal insufficiency 01/20/2016  . HTN (hypertension) 09/20/2015  . HLD (hyperlipidemia) 09/20/2015  . Sinus tachycardia 02/12/2014  . Marijuana abuse 06/28/2013  . Diabetes mellitus type II, uncontrolled ([place]) 12/09/2012    Past Surgical History:  Procedure Laterality Date  . CIRCUMCISION N/A 05/29/2019   Procedure: CIRCUMCISION ADULT;  Surgeon: Ceasar Mons, MD;  Location[facility];  Service: Urology;  Laterality: N/A;  . PROSTATE BIOPSY    . TONSILLECTOMY  child  . TRANSURETHRAL RESECTION OF PROSTATE N/A 05/29/2019   Procedure: TRANSURETHRAL RESECTION OF THE PROSTATE (TURP)/ CYSTOSCOPY;  Surgeon: Ceasar Mons, MD;  Location: [facility];  Service: Urology;  Laterality: N/A;       Family History  Problem Relation Age of Onset  . Hypertension Mother   . Hypertension Father   . Diabetes Father   . Prostate cancer Maternal Grandfather 96  . Breast cancer Neg Hx   . Colon cancer Neg Hx   . Pancreatic cancer Neg Hx     Social History   Tobacco Use  . Smoking status: Never Smoker  . Smokeless tobacco: Never Used  Vaping Use  . Vaping Use: Never used  Substance Use Topics  . Alcohol use: Yes  Comment: drinks beer 40 oz x 2 daily  (05-24-2019 per pt no alcohol since 05-03-2019)  . Drug use: Yes    Types: Cocaine, Marijuana    Comment: smokes 2g crack daily  (05-24-2019 per pt last crack and marijuana 05-03-2019)    Home Medications Prior to Admission medications   Medication Sig Start Date End Date Taking? Authorizing Provider  abiraterone acetate (ZYTIGA) 250 MG tablet Take 4 tablets by mouth daily *Take on an empty stomach 1 hour BEFORE or 2 hours AFTER a meal. Patient taking differently: Take  1,000 mg by mouth daily.  08/23/19   Wyatt Portela, MD  acamprosate (CAMPRAL) 333 MG tablet Take 2 tablets (666 mg total) by mouth 2 (two) times daily with a meal. 09/24/19   Clary, Cordie Grice, MD  amLODipine (NORVASC) 10 MG tablet Take 1 tablet (10 mg total) by mouth daily. For high blood pressure Patient not taking: Reported on 08/25/2019 01/27/18   Lindell Spar I, NP  aspirin 81 MG EC tablet Take 1 tablet (81 mg total) by mouth daily. Swallow whole. Patient not taking: Reported on 08/25/2019 05/08/19   Connye Burkitt, NP  atorvastatin (LIPITOR) 10 MG tablet Take 1 tablet (10 mg total) by mouth daily. 09/25/19   [name], MD  cephALEXin (KEFLEX) 500 MG capsule Take 1 capsule (500 mg total) by mouth 2 (two) times daily. 09/24/19   [name], MD  folic acid (FOLVITE) 1 MG tablet Take 1 tablet (1 mg total) by mouth daily. 09/25/19   [name], MD  glipiZIDE (GLUCOTROL) 5 MG tablet Take 1 tablet (5 mg total) by mouth daily before breakfast. Patient not taking: Reported on 08/25/2019 05/08/19   Connye Burkitt, NP  HYDROcodone-acetaminophen (NORCO) 5-325 MG tablet Take 1 tablet by mouth every 4 (four) hours as needed for moderate pain. Patient not taking: Reported on 08/25/2019 05/29/19   Ceasar Mons, MD  Insulin Aspart Prot & Aspart (NOVOLOG MIX 70/30 [place]) Inject 7-12 Units into the skin 2 (two) times daily. Per pt ,  7 units in AM;  12 units in PM    [provider]  LORazepam (ATIVAN) 1 MG tablet Take 1 tablet (1 mg total) by mouth every 6 (six) hours as needed (see taper). 09/24/19   [name], MD  mirtazapine (REMERON) 15 MG tablet Take 1 tablet (15 mg total) by mouth at bedtime. 09/24/19   [name], MD  Multiple Vitamin (MULTIVITAMIN WITH MINERALS) TABS tablet Take 1 tablet by mouth daily. 09/25/19   [name], MD  ondansetron (ZOFRAN-ODT) 4 MG disintegrating tablet Take 1 tablet (4 mg total) by mouth every 6 (six) hours as needed  for nausea or vomiting. 09/24/19   [name], MD  thiamine 100 MG tablet Take 1 tablet (100 mg total) by mouth daily. 09/25/19   [name], MD    Allergies    Patient has no known allergies.  Review of Systems   Review of Systems  Constitutional: Negative for fever.  Gastrointestinal: Negative for abdominal pain and vomiting.  Genitourinary: Negative for difficulty urinating, dysuria, genital sores and scrotal swelling.       Groin swelling and pain  Musculoskeletal: Negative for back pain.    Physical Exam Updated Vital Signs BP (!) 150/104 (BP Location: Left Arm)   Pulse 100   Temp 97.6 F (36.4 C) (Oral)   Resp 20   Ht 5\' 7"  (1.702 m)  Wt 93 kg   SpO2 97%   BMI 32.11 kg/m   Physical Exam Constitutional:      Appearance: He is well-developed.  Pulmonary:     Effort: Pulmonary effort is normal.  Abdominal:     General: There is no distension.     Palpations: Abdomen is soft. There is no mass.     Tenderness: There is no abdominal tenderness.  Genitourinary:    Comments: Groin is generally tender without swelling, redness. No tenderness localized to the testicles. Circumcised, no penile swelling. No inguinal lymphadenopathy.  Musculoskeletal:        General: Normal range of motion.     Cervical back: Normal range of motion.  Skin:    General: Skin is warm and dry.  Neurological:     Mental Status: He is alert and oriented to person, place, and time.     ED Results / Procedures / Treatments   Labs (all labs ordered are listed, but only abnormal results are displayed) Labs Reviewed  URINALYSIS, ROUTINE W REFLEX MICROSCOPIC   Results for orders placed or performed during the hospital encounter of 12/24/19  Urinalysis, Routine w reflex microscopic Urine, Clean Catch  Result Value Ref Range   Color, Urine STRAW (A) YELLOW   APPearance CLEAR CLEAR   Specific Gravity, Urine 1.018 1.005 - 1.030   pH 7.0 5.0 - 8.0   Glucose, UA >=500 (A) NEGATIVE  mg/dL   Hgb urine dipstick NEGATIVE NEGATIVE   Bilirubin Urine NEGATIVE NEGATIVE   Ketones, ur NEGATIVE NEGATIVE mg/dL   Protein, ur NEGATIVE NEGATIVE mg/dL   Nitrite NEGATIVE NEGATIVE   Leukocytes,Ua NEGATIVE NEGATIVE   RBC / HPF 0-5 0 - 5 RBC/hpf   WBC, UA 6-10 0 - 5 WBC/hpf   Bacteria, UA NONE SEEN NONE SEEN  CBC with Differential  Result Value Ref Range   WBC 3.0 (L) 4.0 - 10.5 K/uL   RBC 4.10 (L) 4.22 - 5.81 MIL/uL   Hemoglobin 12.7 (L) 13.0 - 17.0 g/dL   HCT 37.7 (L) 39 - 52 %   MCV 92.0 80.0 - 100.0 fL   MCH 31.0 26.0 - 34.0 pg   MCHC 33.7 30.0 - 36.0 g/dL   RDW 12.6 11.5 - 15.5 %   Platelets 296 150 - 400 K/uL   nRBC 0.0 0.0 - 0.2 %   Neutrophils Relative % 68 %   Neutro Abs 2.1 1.7 - 7.7 K/uL   Lymphocytes Relative 12 %   Lymphs Abs 0.4 (L) 0.7 - 4.0 K/uL   Monocytes Relative 16 %   Monocytes Absolute 0.5 0.1 - 1.0 K/uL   Eosinophils Relative 2 %   Eosinophils Absolute 0.1 0.0 - 0.5 K/uL   Basophils Relative 1 %   Basophils Absolute 0.0 0.0 - 0.1 K/uL   Immature Granulocytes 1 %   Abs Immature Granulocytes 0.02 0.00 - 0.07 K/uL  Basic metabolic panel  Result Value Ref Range   Sodium 135 135 - 145 mmol/L   Potassium 4.2 3.5 - 5.1 mmol/L   Chloride 99 98 - 111 mmol/L   CO2 26 22 - 32 mmol/L   Glucose, Bld 215 (H) 70 - 99 mg/dL   BUN 19 6 - 20 mg/dL   Creatinine, Ser 1.04 0.61 - 1.24 mg/dL   Calcium 9.1 8.9 - 10.3 mg/dL   GFR, Estimated >60 >60 mL/min   Anion gap 10 5 - 15   EKG None  Radiology No results found.  Procedures Procedures (including  critical care time)  Medications Ordered in ED Medications - No data to display  ED Course  I have reviewed the triage vital signs and the nursing notes.  Pertinent labs & imaging results that were available during my care of the patient were reviewed by me and considered in my medical decision making (see chart for details).    MDM Rules/Calculators/A&P                          Patient with history of  prostate cancer presents with increased groin pain. No fever, swelling. Still on chemo, completed radiation, s/p TURP.   He denies difficulty urinating. No fever or bloody urine. He reports uncontrolled pain only since running out of his hydrocodone.   Pain addressed in the ED. Labs reviewed and show no evidence infection. Mildly hyperglycemic. No DKA. Pain controlled.   He can be discharged home with small quantity Rx hydrocodone and is encouraged to see his doctor for further pain management.   Final Clinical Impression(s) / ED Diagnoses Final diagnoses:  None   1. Groin pain 2. History of prostate cancer  Rx / DC Orders ED Discharge Orders    None       Dennie Bible 57/32/20 2542    Delora Fuel, MD 70/62/37 [phone]

## 2019-12-24 NOTE — ED Triage Notes (Signed)
Pt arrived via [facility] from home. Pt has a hx of prostate cancer and finished radiation 11/1. He reports lower groin pain that began 5 days ago that he describes as sharp and jabbing.   Vitals from EMS BP: 130 palpated HR: 100 O2: 98% CBG: 353

## 2019-12-25 MED ORDER — HYDROCODONE-ACETAMINOPHEN 5-325 MG PO TABS: 1.0000 | ORAL_TABLET | ORAL | 0 refills | Status: DC | PRN

## 2019-12-25 MED ORDER — HYDROCODONE-ACETAMINOPHEN 5-325 MG PO TABS
1.0000 | ORAL_TABLET | Freq: Once | ORAL | Status: AC
  Administered 2019-12-25: 1 via ORAL
  Filled 2019-12-25: qty 1

## 2019-12-25 MED ORDER — KETOROLAC TROMETHAMINE 30 MG/ML IJ SOLN
15.0000 mg | Freq: Once | INTRAMUSCULAR | Status: AC
  Administered 2019-12-25: 15 mg via INTRAVENOUS
  Filled 2019-12-25: qty 1

## 2019-12-25 NOTE — Discharge Instructions (Addendum)
Your tests are reassuring in the emergency department. No infection or acute process identified. Please call your doctor tomorrow for further pain management. A limited number of your previously prescribed pain pills have been called into your pharmacy until you contact your doctor for further prescriptions.

## 2019-12-25 NOTE — ED Notes (Signed)
Pt calling for a ride home

## 2020-01-08 NOTE — Progress Notes (Signed)
Radiation Oncology         [phone]) [phone] ________________________________  Name: Jonathan Austin MRN: [id number]  Date: 01/09/2020  DOB: 06-12-1965  Post Treatment Note  CC: [facility]  Taylor, Nestor Lewandowsky*  Diagnosis:   54 y.o. gentleman with locally advanced, Stage T1c adenocarcinoma of the prostate with a Gleason's score of 4+4 and a PSA of 317  Interval Since Last Radiation:  9.5 weeks  08/29/19 - 11/01/19; concurrent with LT-ADT (started 05/23/19) 1. The prostate, seminal vesicles, and pelvic lymph nodes were initially treated to 45 Gy in 25 fractions of 1.8 Gy  2. The prostate only was boosted to 75 Gy with 15 additional fractions of 2.0 Gy    Narrative:  I spoke with the patient to conduct his routine scheduled 1 month follow up visit via telephone to spare the patient unnecessary potential exposure in the healthcare setting during the current COVID-19 pandemic.  The patient was notified in advance and gave permission to proceed with this visit format.  He tolerated radiation treatment relatively well with only minor urinary irritation and modest fatigue.  He did experience increased nocturia, dysuria, urgency, and frequency but maintained a strong stream and denied gross hematuria, incomplete emptying, straining, or incontinence. He did develop diarrhea that was managed with Imodium prn.                              On review of systems, the patient states that he is doing very well in general and is in good spirits during our phone call.  He has had complete resolution of LUTS and reports a strong flow of stream which he is quite pleased with.  He specifically denies dysuria, gross hematuria, excessive daytime frequency, urgency, hesitancy, intermittency, straining to void, incomplete bladder emptying or incontinence.  His IPSS score was 7 indicating mild urinary symptoms only with nocturia x3 and occasional urgency if he postpones urination for too long which  is pretty much his baseline.  He denies any abdominal pain, nausea, vomiting, diarrhea or constipation.  He reports a healthy appetite and is maintaining his weight.  He has not had any recent issues with his depression and feels that this is much better controlled since his last psychiatric admission.  He continues to tolerate the ADT fairly well but unfortunately, missed his appointment in November 2021 when he was due for his next 28-month Eligard injection and per patient, was unable to reschedule his appointment due to billing issues with [facility].  Otherwise, he is pleased with his progress to date.  ALLERGIES:  has No Known Allergies.  Meds: Current Outpatient Medications  Medication Sig Dispense Refill  . abiraterone acetate (ZYTIGA) 250 MG tablet Take 4 tablets by mouth daily *Take on an empty stomach 1 hour BEFORE or 2 hours AFTER a meal. (Patient taking differently: Take 1,000 mg by mouth daily. ) 120 tablet 0  . acamprosate (CAMPRAL) 333 MG tablet Take 2 tablets (666 mg total) by mouth 2 (two) times daily with a meal. (Patient not taking: Reported on 12/25/2019) 60 tablet 0  . amLODipine (NORVASC) 10 MG tablet Take 1 tablet (10 mg total) by mouth daily. For high blood pressure (Patient not taking: Reported on 08/25/2019) 30 tablet 0  . aspirin 81 MG EC tablet Take 1 tablet (81 mg total) by mouth daily. Swallow whole. (Patient not taking: Reported on 08/25/2019) 30 tablet 0  . atorvastatin (LIPITOR)  10 MG tablet Take 1 tablet (10 mg total) by mouth daily. (Patient not taking: Reported on 12/25/2019) 30 tablet 0  . cephALEXin (KEFLEX) 500 MG capsule Take 1 capsule (500 mg total) by mouth 2 (two) times daily. (Patient not taking: Reported on 12/25/2019) 10 capsule 0  . folic acid (FOLVITE) 1 MG tablet Take 1 tablet (1 mg total) by mouth daily. (Patient not taking: Reported on 12/25/2019) 30 tablet 0  . glipiZIDE (GLUCOTROL) 5 MG tablet Take 1 tablet (5 mg total) by mouth daily before  breakfast. (Patient not taking: Reported on 08/25/2019) 30 tablet 0  . HYDROcodone-acetaminophen (NORCO) 5-325 MG tablet Take 1 tablet by mouth every 4 (four) hours as needed for moderate pain. 5 tablet 0  . LORazepam (ATIVAN) 1 MG tablet Take 1 tablet (1 mg total) by mouth every 6 (six) hours as needed (see taper). (Patient not taking: Reported on 12/25/2019) 30 tablet 0  . mirtazapine (REMERON) 15 MG tablet Take 1 tablet (15 mg total) by mouth at bedtime. (Patient not taking: Reported on 12/25/2019) 30 tablet 0  . Multiple Vitamin (MULTIVITAMIN WITH MINERALS) TABS tablet Take 1 tablet by mouth daily. (Patient not taking: Reported on 12/25/2019) 30 tablet 0  . ondansetron (ZOFRAN-ODT) 4 MG disintegrating tablet Take 1 tablet (4 mg total) by mouth every 6 (six) hours as needed for nausea or vomiting. (Patient not taking: Reported on 12/25/2019) 20 tablet 0  . thiamine 100 MG tablet Take 1 tablet (100 mg total) by mouth daily. (Patient not taking: Reported on 12/25/2019) 30 tablet 0   No current facility-administered medications for this encounter.    Physical Findings:  vitals were not taken for this visit.   /Unable to assess due to telephone follow up visit format.   Lab Findings: Lab Results  Component Value Date   WBC 3.0 (L) 12/24/2019   HGB 12.7 (L) 12/24/2019   HCT 37.7 (L) 12/24/2019   MCV 92.0 12/24/2019   PLT 296 12/24/2019     Radiographic Findings: No results found.  Impression/Plan: 1. 54 y.o. gentleman with locally advanced, Stage T1c adenocarcinoma of the prostate with a Gleason's score of 4+4 and a PSA of 317. He will continue to follow up with urology for ongoing PSA determinations and needs a follow-up appointment with Dr. Lovena Neighbours ASAP since he is now a month overdue for his ADT.  His PSA had decreased to 153 when last checked in September 2021 after starting ADT, which is a good sign.  He anticipates completing the full 2-year course of ADT under the care of Dr.  Lovena Neighbours.  He understands what to expect with regards to PSA monitoring going forward. I will look forward to following his response to treatment via correspondence with urology, and would be happy to continue to participate in his care if clinically indicated. I talked to the patient about what to expect in the future, including his risk for erectile dysfunction and rectal bleeding. I encouraged him to call or return to the office if he has any questions regarding his previous radiation or possible radiation side effects. He was comfortable with this plan and will follow up as needed.    Nicholos Johns, PA-C

## 2020-01-08 NOTE — Progress Notes (Signed)
°  Radiation Oncology         [phone]) [phone] ________________________________  Name: Jonathan Austin MRN: [id number]  Date: 11/01/2019  DOB: 07-Jun-1965  End of Treatment Note  Diagnosis:   54 y.o. gentleman with locally advanced, Stage T1c adenocarcinoma of the prostate with a Gleason's score of 4+4 and a PSA of 317     Indication for treatment:  Curative, Definitive Radiotherapy       Radiation treatment dates:   08/29/19 - 11/01/19; concurrent with LT-ADT (started 05/23/19)  Site/dose:  1. The prostate, seminal vesicles, and pelvic lymph nodes were initially treated to 45 Gy in 25 fractions of 1.8 Gy  2. The prostate only was boosted to 75 Gy with 15 additional fractions of 2.0 Gy   Beams/energy:  1. The prostate, seminal vesicles, and pelvic lymph nodes were initially treated using VMAT intensity modulated radiotherapy delivering 6 megavolt photons. Image guidance was performed with CB-CT studies prior to each fraction. He was immobilized with a body fix lower extremity mold.  2. the prostate only was boosted using VMAT intensity modulated radiotherapy delivering 6 megavolt photons. Image guidance was performed with CB-CT studies prior to each fraction. He was immobilized with a body fix lower extremity mold.  Narrative: The patient tolerated radiation treatment relatively well with only minor urinary irritation and modest fatigue.  He did experience increased nocturia, dysuria, urgency, and frequency but maintained a strong stream and denied gross hematuria, incomplete emptying, straining, or incontinence. He did develop diarrhea that was managed with Imodium prn.  Plan: The patient has completed radiation treatment. He will return to radiation oncology clinic for routine followup in one month. I advised him to call or return sooner if he has any questions or concerns related to his recovery or treatment. ________________________________  [name]. Tammi Klippel, M.D.

## 2020-01-09 ENCOUNTER — Inpatient Hospital Stay: Payer: Self-pay | Admitting: Oncology

## 2020-01-09 ENCOUNTER — Encounter: Payer: Self-pay | Admitting: Urology

## 2020-01-09 ENCOUNTER — Other Ambulatory Visit: Payer: Self-pay

## 2020-01-09 ENCOUNTER — Inpatient Hospital Stay: Payer: Self-pay | Attending: Oncology

## 2020-01-09 ENCOUNTER — Ambulatory Visit
Admission: RE | Admit: 2020-01-09 | Discharge: 2020-01-09 | Disposition: A | Discharge status: Home / Self Care | Payer: Self-pay | Source: Ambulatory Visit | Attending: Urology | Admitting: Urology

## 2020-01-09 DIAGNOSIS — C61 Malignant neoplasm of prostate: Secondary | ICD-10-CM

## 2020-01-09 NOTE — Progress Notes (Signed)
Patient states that he has not seen his urologist since October.Patients IPPS  was a 7. Patient states that he empties his bladder with urination.Patient reports a moderate stream.

## 2020-01-09 NOTE — Progress Notes (Deleted)
Hematology and Oncology Follow Up Visit  Jonathan Austin [id number] [birth year] 54 y.o. 01/09/2020 9:22 AM [facility]   Principle Diagnosis: 54 year old man with prostate cancer presented with Gleason score 4+4 = 8 and PSA was 266.  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   Prior Therapy:  He is status post prostate biopsy completed in February 2021.  He is status post cystoscopy and TURP procedure by Dr. Lovena Neighbours in April 2021.  He is status post radiation therapy to the prostate and pelvis between July 22 and November 01, 2019.  He received 75 Gray total dose.  Current therapy:  Eligard under the care of [facility].   Radiation therapy to the prostate and pelvis currently ongoing.  Zytiga 1000 mg daily started in July 2021.  Interim History: Jonathan Austin is here for a follow-up visit.  Since last visit, he completed radiation therapy without any major complaints.      Medications: Updated on review. Current Outpatient Medications  Medication Sig Dispense Refill  . abiraterone acetate (ZYTIGA) 250 MG tablet Take 4 tablets by mouth daily *Take on an empty stomach 1 hour BEFORE or 2 hours AFTER a meal. (Patient taking differently: Take 1,000 mg by mouth daily. ) 120 tablet 0  . acamprosate (CAMPRAL) 333 MG tablet Take 2 tablets (666 mg total) by mouth 2 (two) times daily with a meal. (Patient not taking: Reported on 12/25/2019) 60 tablet 0  . amLODipine (NORVASC) 10 MG tablet Take 1 tablet (10 mg total) by mouth daily. For high blood pressure (Patient not taking: Reported on 08/25/2019) 30 tablet 0  . aspirin 81 MG EC tablet Take 1 tablet (81 mg total) by mouth daily. Swallow whole. (Patient not taking: Reported on 08/25/2019) 30 tablet 0  . atorvastatin (LIPITOR) 10 MG tablet Take 1 tablet (10 mg total) by mouth daily. (Patient not taking: Reported on 12/25/2019) 30 tablet 0   . cephALEXin (KEFLEX) 500 MG capsule Take 1 capsule (500 mg total) by mouth 2 (two) times daily. (Patient not taking: Reported on 12/25/2019) 10 capsule 0  . folic acid (FOLVITE) 1 MG tablet Take 1 tablet (1 mg total) by mouth daily. (Patient not taking: Reported on 12/25/2019) 30 tablet 0  . glipiZIDE (GLUCOTROL) 5 MG tablet Take 1 tablet (5 mg total) by mouth daily before breakfast. (Patient not taking: Reported on 08/25/2019) 30 tablet 0  . HYDROcodone-acetaminophen (NORCO) 5-325 MG tablet Take 1 tablet by mouth every 4 (four) hours as needed for moderate pain. 5 tablet 0  . LORazepam (ATIVAN) 1 MG tablet Take 1 tablet (1 mg total) by mouth every 6 (six) hours as needed (see taper). (Patient not taking: Reported on 12/25/2019) 30 tablet 0  . mirtazapine (REMERON) 15 MG tablet Take 1 tablet (15 mg total) by mouth at bedtime. (Patient not taking: Reported on 12/25/2019) 30 tablet 0  . Multiple Vitamin (MULTIVITAMIN WITH MINERALS) TABS tablet Take 1 tablet by mouth daily. (Patient not taking: Reported on 12/25/2019) 30 tablet 0  . ondansetron (ZOFRAN-ODT) 4 MG disintegrating tablet Take 1 tablet (4 mg total) by mouth every 6 (six) hours as needed for nausea or vomiting. (Patient not taking: Reported on 12/25/2019) 20 tablet 0  . thiamine 100 MG tablet Take 1 tablet (100 mg total) by mouth daily. (Patient not taking: Reported on 12/25/2019) 30 tablet 0   No current facility-administered medications for this visit.     Allergies:  No Known Allergies   Physical Exam:    ECOG: 0   General appearance: Alert, awake without any distress. Head: Atraumatic without abnormalities Oropharynx: Without any thrush or ulcers. Eyes: No scleral icterus. Lymph nodes: No lymphadenopathy noted in the cervical, supraclavicular, or axillary nodes Heart:regular rate and rhythm, without any murmurs or gallops.   Lung: Clear to auscultation without any rhonchi, wheezes or dullness to percussion. Abdomin: Soft,  nontender without any shifting dullness or ascites. Musculoskeletal: No clubbing or cyanosis. Neurological: No motor or sensory deficits. Skin: No rashes or lesions.     Lab Results: Lab Results  Component Value Date   WBC 3.0 (L) 12/24/2019   HGB 12.7 (L) 12/24/2019   HCT 37.7 (L) 12/24/2019   MCV 92.0 12/24/2019   PLT 296 12/24/2019     Chemistry      Component Value Date/Time   NA 135 12/24/2019 2256   K 4.2 12/24/2019 2256   CL 99 12/24/2019 2256   CO2 26 12/24/2019 2256   BUN 19 12/24/2019 2256   CREATININE 1.04 12/24/2019 2256   CREATININE 1.34 (H) 10/09/2019 1441      Component Value Date/Time   CALCIUM 9.1 12/24/2019 2256   ALKPHOS 61 10/09/2019 1441   AST 16 10/09/2019 1441   ALT 17 10/09/2019 1441   BILITOT 0.3 10/09/2019 1441         Impression and Plan:   54 year old man with:  1.  Advanced prostate cancer with lymphadenopathy diagnosed in February 2021.  He has castration-sensitive disease.  He is currently receiving Zytiga in addition to androgen deprivation and has tolerated very well.  PSA is currently pending but I anticipate improvement in his overall count as he is getting effective treatment.  Risks and benefits of continuing Zytiga long-term were discussed.  Complications including hypertension, renal sufficiency, hypokalemia among others were reviewed.  After discussion today he is agreeable to continue.  The duration of therapy is to be determined.  Lifetime treatment given his advanced disease is the standard of care.  He has a complete response therapy discontinuation would be considered in 2 years given the fact that he received definitive therapy.   2.    Leukocytopenia: Appears to be chronic in nature and likely related to medication and possibly element of marrow toxicity from radiation.  We will continue to monitor future.   3.  Androgen deprivation: He is currently receiving that under the care of Dr. Lovena Neighbours.  Next injection will  be October and will be repeated in 6 months.   4.   Follow-up: He will return in 3 months to follow his progress.   30  minutes were dedicated to this visit.  The time was spent on reviewing his disease status, reviewing treatment options and outlining future plan of care.     Zola Button, MD 12/2/20219:22 AM

## 2020-09-09 ENCOUNTER — Encounter (HOSPITAL_COMMUNITY): Payer: Self-pay | Admitting: Emergency Medicine

## 2020-09-09 ENCOUNTER — Emergency Department (HOSPITAL_COMMUNITY)
Admission: EM | Admit: 2020-09-09 | Discharge: 2020-09-10 | Disposition: A | Discharge status: Home / Self Care | Payer: Self-pay | Attending: Emergency Medicine | Admitting: Emergency Medicine

## 2020-09-09 ENCOUNTER — Other Ambulatory Visit: Payer: Self-pay

## 2020-09-09 DIAGNOSIS — Z794 Long term (current) use of insulin: Secondary | ICD-10-CM | POA: Insufficient documentation

## 2020-09-09 DIAGNOSIS — X58XXXA Exposure to other specified factors, initial encounter: Secondary | ICD-10-CM | POA: Insufficient documentation

## 2020-09-09 DIAGNOSIS — Z79899 Other long term (current) drug therapy: Secondary | ICD-10-CM | POA: Insufficient documentation

## 2020-09-09 DIAGNOSIS — Z7982 Long term (current) use of aspirin: Secondary | ICD-10-CM | POA: Insufficient documentation

## 2020-09-09 DIAGNOSIS — I10 Essential (primary) hypertension: Secondary | ICD-10-CM | POA: Insufficient documentation

## 2020-09-09 DIAGNOSIS — Z7984 Long term (current) use of oral hypoglycemic drugs: Secondary | ICD-10-CM | POA: Insufficient documentation

## 2020-09-09 DIAGNOSIS — E119 Type 2 diabetes mellitus without complications: Secondary | ICD-10-CM | POA: Insufficient documentation

## 2020-09-09 DIAGNOSIS — S0502XA Injury of conjunctiva and corneal abrasion without foreign body, left eye, initial encounter: Secondary | ICD-10-CM | POA: Insufficient documentation

## 2020-09-09 DIAGNOSIS — Z8546 Personal history of malignant neoplasm of prostate: Secondary | ICD-10-CM | POA: Insufficient documentation

## 2020-09-09 MED ORDER — TETRACAINE HCL 0.5 % OP SOLN
2.0000 [drp] | Freq: Once | OPHTHALMIC | Status: AC
  Administered 2020-09-10: 2 [drp] via OPHTHALMIC
  Filled 2020-09-09: qty 4

## 2020-09-09 MED ORDER — ACETAMINOPHEN 500 MG PO TABS: 1000.0000 mg | ORAL_TABLET | Freq: Once | ORAL | Status: DC

## 2020-09-09 MED ORDER — FLUORESCEIN SODIUM 1 MG OP STRP
1.0000 | ORAL_STRIP | Freq: Once | OPHTHALMIC | Status: AC
  Administered 2020-09-10: 1 via OPHTHALMIC
  Filled 2020-09-09: qty 1

## 2020-09-09 NOTE — ED Triage Notes (Signed)
Pt here for redness and pain in L eye. Pt was sawing wood and thinks he got some saw dust in his eye. Pt endorses blurry vision out of L eye.

## 2020-09-09 NOTE — ED Provider Notes (Signed)
Emergency Medicine Provider Triage Evaluation Note  Jonathan Austin , a 55 y.o. male  was evaluated in triage.  Pt complains of eye pain.  Patient states around 4:00 this afternoon he got sawdust in his eyes.  He has pain in both eyes, but it is significantly worse in the left side.  He think something might of gotten in his eye.  Blurry vision, but no vision loss.  He does not wear glasses or contacts.  He tried to flush them out with water, has not done anything else.  Review of Systems  Positive: Eye pain L>R Negative: Eye discharge  Physical Exam  BP 136/90   Pulse (!) 119   Temp 99.4 F (37.4 C) (Oral)   Resp 20   Ht '5\' 7"'$  (1.702 m)   Wt 104.3 kg   SpO2 95%   BMI 36.02 kg/m  Gen:   Awake, no distress   Resp:  Normal effort  MSK:   Moves extremities without difficulty  Other:  L eye injected. EOMI. Periorbital edema around the L eye  Medical Decision Making  Medically screening exam initiated at 9:28 PM.  Appropriate orders placed.  Jonathan Austin was informed that the remainder of the evaluation will be completed by another provider, this initial triage assessment does not replace that evaluation, and the importance of remaining in the ED until their evaluation is complete.     Franchot Heidelberg, PA-C 09/09/20 2129    Daleen Bo, MD 09/09/20 [phone]

## 2020-09-10 MED ORDER — ERYTHROMYCIN 5 MG/GM OP OINT
1.0000 "application " | TOPICAL_OINTMENT | Freq: Once | OPHTHALMIC | Status: AC
  Administered 2020-09-10: 1 via OPHTHALMIC
  Filled 2020-09-10: qty 3.5

## 2020-09-10 MED ORDER — ERYTHROMYCIN 5 MG/GM OP OINT: TOPICAL_OINTMENT | OPHTHALMIC | 0 refills | Status: DC

## 2020-09-10 NOTE — Discharge Instructions (Addendum)
You have been seen and discharged from the emergency department.  You were found to have a corneal abrasion in the left eye.  Place erythromycin ointment in the left eye 4 times daily for the next 5 days.  Follow-up with optometry/ophthalmology for reevaluation and ensuring that the eye is healing appropriately.  If you have any worsening symptoms, worsening eye redness, worsening eye pain, vision change/loss, or further concerns for your health please return to the emergency department immediately for further evaluation.  Take other home medications as directed.  You may use over-the-counter medication for pain control.  You may leave the eye uncovered.

## 2020-09-10 NOTE — ED Notes (Signed)
Visual acuity test results: Left Eye- 10/32 Right Eye- 10/25

## 2020-09-10 NOTE — ED Provider Notes (Signed)
[facility] EMERGENCY DEPARTMENT Provider Note   CSN: [id number] Arrival date & time: 09/09/20  2101     History Chief Complaint  Patient presents with   Eye Problem    Abbot Ballog is a 55 y.o. male.  HPI  55 year old male with past medical history of DM, HTN, HLD presents emergency department with concern for left eye discomfort.  Patient states yesterday afternoon he was cutting wood and felt like sawdust got into his eyes.  Since then he has been having left eye irritation and sensitivity.  Yesterday he felt like he had slight blurred vision in the left eye but this is resolved today.  He is now complaining of photophobia and left eye sensation of foreign body/scratching.  Denies any vision loss.  No severe left eye pain or pain with extraocular movements.  Past Medical History:  Diagnosis Date   Alcohol use disorder, severe, dependence ([place])    05-24-2019 recent [facility] admission for suicide ideation and alcohol / cocaine detox 05-04-2019 in epic,  pt stated has been taking naltrexone daily as prescribed since and no alcohol since   Cocaine abuse ([place])    05-24-2019  pt had recent [facility] admission, pt stated last used  "crack" 05-03-2019   Cocaine abuse with cocaine-induced mood disorder ([place])    Depression    History of suicidal ideation    multiple [facility] admission's;  last one admitted 05-04-2019   Hyperplasia of prostate with lower urinary tract symptoms (LUTS)    Hypertension    followed by pcp   OSA on CPAP    study 10-02-2012 in epic   Phimosis    Prostate cancer [facility]) urologist-- dr winter   dx 02/ 2021;  localized advanced, gleason 4+4, PSA 317   Type 2 diabetes mellitus treated with insulin ([place])    followed by pcp   (05-24-2019  per stated checks blood sugar twice daily, fasting sugar-- 180-200)   Wears glasses     Patient Active Problem List   Diagnosis Date Noted   Depression 09/24/2019   Malignant neoplasm of prostate ([place]) 04/30/2019    Polysubstance dependence including opioid drug with daily use ([place]) 03/16/2017   Cocaine use disorder, severe, dependence ([place]) 07/19/2016   Alcohol use disorder, severe, dependence ([place]) 07/16/2016   Thoracic aortic atherosclerosis ([place]) 05/09/2016   Major depressive disorder, recurrent severe without psychotic features ([place]) 02/21/2016   Renal insufficiency 01/20/2016   HTN (hypertension) 09/20/2015   HLD (hyperlipidemia) 09/20/2015   Sinus tachycardia 02/12/2014   Marijuana abuse 06/28/2013   Diabetes mellitus type II, uncontrolled ([place]) 12/09/2012    Past Surgical History:  Procedure Laterality Date   CIRCUMCISION N/A 05/29/2019   Procedure: CIRCUMCISION ADULT;  Surgeon: Ceasar Mons, MD;  Location: [facility];  Service: Urology;  Laterality: N/A;   PROSTATE BIOPSY     TONSILLECTOMY  child   TRANSURETHRAL RESECTION OF PROSTATE N/A 05/29/2019   Procedure: TRANSURETHRAL RESECTION OF THE PROSTATE (TURP)/ CYSTOSCOPY;  Surgeon: Ceasar Mons, MD;  Location: [facility];  Service: Urology;  Laterality: N/A;       Family History  Problem Relation Age of Onset   Hypertension Mother    Hypertension Father    Diabetes Father    Prostate cancer Maternal Grandfather 69   Breast cancer Neg Hx    Colon cancer Neg Hx    Pancreatic cancer Neg Hx     Social History   Tobacco Use   Smoking status:  Never   Smokeless tobacco: Never  Vaping Use   Vaping Use: Never used  Substance Use Topics   Alcohol use: Yes    Comment: drinks beer 40 oz x 2 daily  (05-24-2019 per pt no alcohol since 05-03-2019)   Drug use: Yes    Types: Cocaine, Marijuana    Comment: smokes 2g crack daily  (05-24-2019 per pt last crack and marijuana 05-03-2019)    Home Medications Prior to Admission medications   Medication Sig Start Date End Date Taking? Authorizing Provider  abiraterone acetate (ZYTIGA) 250 MG tablet Take 4 tablets by mouth daily *Take on  an empty stomach 1 hour BEFORE or 2 hours AFTER a meal. Patient taking differently: Take 1,000 mg by mouth daily.  08/23/19   Wyatt Portela, MD  acamprosate (CAMPRAL) 333 MG tablet Take 2 tablets (666 mg total) by mouth 2 (two) times daily with a meal. Patient not taking: Reported on 12/25/2019 09/24/19   [name], MD  amLODipine (NORVASC) 10 MG tablet Take 1 tablet (10 mg total) by mouth daily. For high blood pressure 01/27/18   Lindell Spar I, NP  aspirin 81 MG EC tablet Take 1 tablet (81 mg total) by mouth daily. Swallow whole. 05/08/19   Connye Burkitt, NP  atorvastatin (LIPITOR) 10 MG tablet Take 1 tablet (10 mg total) by mouth daily. Patient not taking: Reported on 12/25/2019 09/25/19   [name], MD  cephALEXin (KEFLEX) 500 MG capsule Take 1 capsule (500 mg total) by mouth 2 (two) times daily. Patient not taking: Reported on 12/25/2019 09/24/19   [name], MD  folic acid (FOLVITE) 1 MG tablet Take 1 tablet (1 mg total) by mouth daily. Patient not taking: Reported on 12/25/2019 09/25/19   [name], MD  glipiZIDE (GLUCOTROL) 5 MG tablet Take 1 tablet (5 mg total) by mouth daily before breakfast. Patient not taking: Reported on 08/25/2019 05/08/19   Connye Burkitt, NP  HYDROcodone-acetaminophen (NORCO) 5-325 MG tablet Take 1 tablet by mouth every 4 (four) hours as needed for moderate pain. Patient not taking: Reported on 01/09/2020 12/25/19   Charlann Lange, PA-C  insulin aspart protamine- aspart (NOVOLOG MIX 70/30) (70-30) 100 UNIT/ML injection Inject into the skin.    [provider]  LORazepam (ATIVAN) 1 MG tablet Take 1 tablet (1 mg total) by mouth every 6 (six) hours as needed (see taper). Patient not taking: Reported on 12/25/2019 09/24/19   [name], MD  mirtazapine (REMERON) 15 MG tablet Take 1 tablet (15 mg total) by mouth at bedtime. Patient not taking: Reported on 12/25/2019 09/24/19   [name], MD  Multiple Vitamin  (MULTIVITAMIN WITH MINERALS) TABS tablet Take 1 tablet by mouth daily. 09/25/19   [name], MD  ondansetron (ZOFRAN-ODT) 4 MG disintegrating tablet Take 1 tablet (4 mg total) by mouth every 6 (six) hours as needed for nausea or vomiting. 09/24/19   [name], MD  thiamine 100 MG tablet Take 1 tablet (100 mg total) by mouth daily. 09/25/19   [name], MD    Allergies    Patient has no known allergies.  Review of Systems   Review of Systems  Constitutional:  Negative for fever.  HENT:  Negative for facial swelling.   Eyes:  Positive for photophobia, pain, redness and visual disturbance.  Respiratory:  Negative for shortness of breath.   Cardiovascular:  Negative for chest pain.  Gastrointestinal:  Negative for abdominal pain.  Musculoskeletal:  Negative for neck pain.  Neurological:  Negative for facial asymmetry, weakness, numbness and headaches.   Physical Exam Updated Vital Signs BP (!) 145/94   Pulse 92   Temp 98.3 F (36.8 C)   Resp 16   Ht '5\' 7"'$  (1.702 m)   Wt 104.3 kg   SpO2 100%   BMI 36.02 kg/m   Physical Exam Vitals and nursing note reviewed.  Constitutional:      Appearance: Normal appearance.  HENT:     Head: Normocephalic.     Mouth/Throat:     Mouth: Mucous membranes are moist.  Eyes:     Extraocular Movements: Extraocular movements intact.     Pupils: Pupils are equal, round, and reactive to light.     Comments: Mild left injected sclera with mild edema of the left upper eyelid, fluorescein stain reveals a small linear vertical corneal abrasion at the 9 o'clock position just outside the iris  Cardiovascular:     Rate and Rhythm: Normal rate.  Pulmonary:     Effort: Pulmonary effort is normal. No respiratory distress.  Skin:    General: Skin is warm.  Neurological:     Mental Status: He is alert and oriented to person, place, and time. Mental status is at baseline.  Psychiatric:        Mood and Affect: Mood normal.    ED  Results / Procedures / Treatments   Labs (all labs ordered are listed, but only abnormal results are displayed) Labs Reviewed - No data to display  EKG None  Radiology No results found.  Procedures Procedures   Medications Ordered in ED Medications  acetaminophen (TYLENOL) tablet 1,000 mg (has no administration in time range)  fluorescein ophthalmic strip 1 strip (1 strip Both Eyes Given 09/10/20 0929)  tetracaine (PONTOCAINE) 0.5 % ophthalmic solution 2 drop (2 drops Both Eyes Given 09/10/20 0929)  erythromycin ophthalmic ointment 1 application (1 application Left Eye Given 09/10/20 [id number])    ED Course  I have reviewed the triage vital signs and the nursing notes.  Pertinent labs & imaging results that were available during my care of the patient were reviewed by me and considered in my medical decision making (see chart for details).    MDM Rules/Calculators/A&P                           55 year old male presents emergency department with foreign body sensation in the left eye after cutting wood.  He believes that some sawdust may have scratched the left eye.  He reports to me that the right eye feels normal.  He had an episode of blurred vision but this is resolved, visual acuity is appropriate.  Eye exam consistent with a left corneal abrasion.  Patient does not wear contact lenses, plan to treat with erythromycin ointment.  Gave strict return to ED precautions and follow-up for optometry/ophthalmology.  Patient at this time appears safe and stable for discharge and will be treated as an outpatient.  Discharge plan and strict return to ED precautions discussed, patient verbalizes understanding and agreement.  Final Clinical Impression(s) / ED Diagnoses Final diagnoses:  Abrasion of left cornea, initial encounter    Rx / DC Orders ED Discharge Orders     None        Lorelle Gibbs, DO 09/10/20 [id number]

## 2021-03-02 ENCOUNTER — Ambulatory Visit
Admission: EM | Admit: 2021-03-02 | Discharge: 2021-03-02 | Disposition: A | Discharge status: Home / Self Care | Payer: 59 | Attending: Emergency Medicine | Admitting: Emergency Medicine

## 2021-03-02 ENCOUNTER — Other Ambulatory Visit: Payer: Self-pay

## 2021-03-02 DIAGNOSIS — H6121 Impacted cerumen, right ear: Secondary | ICD-10-CM

## 2021-03-02 NOTE — Discharge Instructions (Addendum)
Thank you for visiting urgent care today.  Please let us know if we have any other issues with your ears.

## 2021-03-02 NOTE — ED Provider Notes (Signed)
[facility]    CSN: [id number] Arrival date & time: 03/02/21  1415    HISTORY   Chief Complaint  Patient presents with   Ear Fullness   HPI Jonathan Austin is a 56 y.o. male. Patient states he went to get his hearing aid fitted today was advised that there was too much wax in his right ear that need to be cleaned out before this could be done.  Patient states he occasionally uses Q-tips to clean out his ears, states he instilled some warm water and peroxide in his ear this morning but was unable to successfully remove any wax on his own.  The history is provided by the patient.  Past Medical History:  Diagnosis Date   Alcohol use disorder, severe, dependence ([place])    05-24-2019 recent [facility] admission for suicide ideation and alcohol / cocaine detox 05-04-2019 in epic,  pt stated has been taking naltrexone daily as prescribed since and no alcohol since   Cocaine abuse ([place])    05-24-2019  pt had recent [facility] admission, pt stated last used  "crack" 05-03-2019   Cocaine abuse with cocaine-induced mood disorder ([place])    Depression    History of suicidal ideation    multiple [facility] admission's;  last one admitted 05-04-2019   Hyperplasia of prostate with lower urinary tract symptoms (LUTS)    Hypertension    followed by pcp   OSA on CPAP    study 10-02-2012 in epic   Phimosis    Prostate cancer [facility]) urologist-- dr winter   dx 02/ 2021;  localized advanced, gleason 4+4, PSA 317   Type 2 diabetes mellitus treated with insulin ([place])    followed by pcp   (05-24-2019  per stated checks blood sugar twice daily, fasting sugar-- 180-200)   Wears glasses    Patient Active Problem List   Diagnosis Date Noted   Depression 09/24/2019   Malignant neoplasm of prostate ([place]) 04/30/2019   Polysubstance dependence including opioid drug with daily use ([place]) 03/16/2017   Cocaine use disorder, severe, dependence ([place]) 07/19/2016   Alcohol use disorder, severe, dependence ([place]) 07/16/2016    Thoracic aortic atherosclerosis ([place]) 05/09/2016   Major depressive disorder, recurrent severe without psychotic features ([place]) 02/21/2016   Renal insufficiency 01/20/2016   HTN (hypertension) 09/20/2015   HLD (hyperlipidemia) 09/20/2015   Sinus tachycardia 02/12/2014   Marijuana abuse 06/28/2013   Diabetes mellitus type II, uncontrolled 12/09/2012   Past Surgical History:  Procedure Laterality Date   CIRCUMCISION N/A 05/29/2019   Procedure: CIRCUMCISION ADULT;  Surgeon: Ceasar Mons, MD;  Location: [facility];  Service: Urology;  Laterality: N/A;   PROSTATE BIOPSY     TONSILLECTOMY  child   TRANSURETHRAL RESECTION OF PROSTATE N/A 05/29/2019   Procedure: TRANSURETHRAL RESECTION OF THE PROSTATE (TURP)/ CYSTOSCOPY;  Surgeon: Ceasar Mons, MD;  Location: [facility];  Service: Urology;  Laterality: N/A;    Home Medications    Prior to Admission medications   Medication Sig Start Date End Date Taking? Authorizing Provider  abiraterone acetate (ZYTIGA) 250 MG tablet Take 4 tablets by mouth daily *Take on an empty stomach 1 hour BEFORE or 2 hours AFTER a meal. Patient taking differently: Take 1,000 mg by mouth daily.  08/23/19   Wyatt Portela, MD  amLODipine (NORVASC) 10 MG tablet Take 1 tablet (10 mg total) by mouth daily. For high blood pressure 01/27/18   Lindell Spar I, NP  aspirin 81 MG EC  tablet Take 1 tablet (81 mg total) by mouth daily. Swallow whole. 05/08/19   Connye Burkitt, NP  erythromycin ophthalmic ointment Place a 1/2 inch ribbon of ointment into the lower eyelid 4 times daily for 5 days. 09/10/20   Horton, Drue Dun M, DO  insulin aspart protamine- aspart (NOVOLOG MIX 70/30) (70-30) 100 UNIT/ML injection Inject into the skin.    [provider]  Multiple Vitamin (MULTIVITAMIN WITH MINERALS) TABS tablet Take 1 tablet by mouth daily. 09/25/19   [name], MD  ondansetron (ZOFRAN-ODT) 4 MG disintegrating tablet  Take 1 tablet (4 mg total) by mouth every 6 (six) hours as needed for nausea or vomiting. 09/24/19   [name], MD  thiamine 100 MG tablet Take 1 tablet (100 mg total) by mouth daily. 09/25/19   [name], MD   Family History Family History  Problem Relation Age of Onset   Hypertension Mother    Hypertension Father    Diabetes Father    Prostate cancer Maternal Grandfather 96   Breast cancer Neg Hx    Colon cancer Neg Hx    Pancreatic cancer Neg Hx    Social History Social History   Tobacco Use   Smoking status: Never   Smokeless tobacco: Never  Vaping Use   Vaping Use: Never used  Substance Use Topics   Alcohol use: Yes    Comment: drinks beer 40 oz x 2 daily  (05-24-2019 per pt no alcohol since 05-03-2019)   Drug use: Yes    Types: Cocaine, Marijuana    Comment: smokes 2g crack daily  (05-24-2019 per pt last crack and marijuana 05-03-2019)   Allergies   Patient has no known allergies.  Review of Systems Review of Systems Pertinent findings noted in history of present illness.   Physical Exam Triage Vital Signs ED Triage Vitals  Enc Vitals Group     BP 12/04/20 0827 (!) 147/82     Pulse Rate 12/04/20 0827 72     Resp 12/04/20 0827 18     Temp 12/04/20 0827 98.3 F (36.8 C)     Temp Source 12/04/20 0827 Oral     SpO2 12/04/20 0827 98 %     Weight --      Height --      Head Circumference --      Peak Flow --      Pain Score 12/04/20 0826 5     Pain Loc --      Pain Edu? --      Excl. in [place]? --   No data found.  Updated Vital Signs BP 133/87 (BP Location: Right Arm)    Pulse 98    Temp 98 F (36.7 C) (Oral)    SpO2 98%   Physical Exam Vitals and nursing note reviewed.  Constitutional:      General: He is not in acute distress.    Appearance: Normal appearance. He is not ill-appearing.  HENT:     Head: Normocephalic and atraumatic.     Salivary Glands: Right salivary gland is not diffusely enlarged or tender. Left salivary gland is not  diffusely enlarged or tender.     Right Ear: External ear normal. No drainage. No middle ear effusion. There is impacted cerumen. Tympanic membrane is not erythematous or bulging.     Left Ear: Tympanic membrane, ear canal and external ear normal. No drainage.  No middle ear effusion. There is no impacted cerumen. Tympanic membrane is not erythematous  or bulging.     Nose: Nose normal. No nasal deformity, septal deviation, mucosal edema, congestion or rhinorrhea.     Right Turbinates: Not enlarged, swollen or pale.     Left Turbinates: Not enlarged, swollen or pale.     Right Sinus: No maxillary sinus tenderness or frontal sinus tenderness.     Left Sinus: No maxillary sinus tenderness or frontal sinus tenderness.     Mouth/Throat:     Lips: Pink. No lesions.     Mouth: Mucous membranes are moist. No oral lesions.     Pharynx: Oropharynx is clear. Uvula midline. No posterior oropharyngeal erythema or uvula swelling.     Tonsils: No tonsillar exudate. 0 on the right. 0 on the left.  Eyes:     General: Lids are normal.        Right eye: No discharge.        Left eye: No discharge.     Extraocular Movements: Extraocular movements intact.     Conjunctiva/sclera: Conjunctivae normal.     Right eye: Right conjunctiva is not injected.     Left eye: Left conjunctiva is not injected.  Neck:     Trachea: Trachea and phonation normal.  Cardiovascular:     Rate and Rhythm: Normal rate and regular rhythm.     Pulses: Normal pulses.     Heart sounds: Normal heart sounds. No murmur heard.   No friction rub. No gallop.  Pulmonary:     Effort: Pulmonary effort is normal. No accessory muscle usage, prolonged expiration or respiratory distress.     Breath sounds: Normal breath sounds. No stridor, decreased air movement or transmitted upper airway sounds. No decreased breath sounds, wheezing, rhonchi or rales.  Chest:     Chest wall: No tenderness.  Musculoskeletal:        General: Normal range of  motion.     Cervical back: Normal range of motion and neck supple. Normal range of motion.  Lymphadenopathy:     Cervical: No cervical adenopathy.  Skin:    General: Skin is warm and dry.     Findings: No erythema or rash.  Neurological:     General: No focal deficit present.     Mental Status: He is alert and oriented to person, place, and time.  Psychiatric:        Mood and Affect: Mood normal.        Behavior: Behavior normal.    Visual Acuity Right Eye Distance:   Left Eye Distance:   Bilateral Distance:    Right Eye Near:   Left Eye Near:    Bilateral Near:     UC Couse / Diagnostics / Procedures:    EKG  Radiology No results found.  Procedures Ear Cerumen Removal  Date/Time: 03/02/2021 3:41 PM Performed by: Phillip Heal, Demetrice A, CMA Authorized by: Lynden Oxford Scales, PA-C   Consent:    Consent obtained:  Verbal   Risks, benefits, and alternatives were discussed: yes     Risks discussed:  Bleeding, infection, pain, TM perforation, incomplete removal and dizziness   Alternatives discussed:  Delayed treatment, no treatment, alternative treatment, observation and referral Universal protocol:    Procedure explained and questions answered to patient or proxy's satisfaction: yes     Patient identity confirmed:  Verbally with patient and arm band Procedure details:    Location:  R ear   Procedure type: irrigation     Procedure outcomes: cerumen removed   Post-procedure details:  Inspection:  Ear canal clear, no bleeding and TM intact   Procedure completion:  Tolerated (including critical care time)  UC Diagnoses / Final Clinical Impressions(s)   I have reviewed the triage vital signs and the nursing notes.  Pertinent labs & imaging results that were available during my care of the patient were reviewed by me and considered in my medical decision making (see chart for details).   Final diagnoses:  None   Cerumen removed without incident.  Patient advised  not to use Q-tips in his ears.  Return precautions advised  ED Prescriptions   None    PDMP not reviewed this encounter.  Pending results:  Labs Reviewed - No data to display  Medications Ordered in UC: Medications - No data to display  Disposition Upon Discharge:  Condition: stable for discharge home Home: take medications as prescribed; routine discharge instructions as discussed; follow up as advised.  Patient presented with an acute illness with associated systemic symptoms and significant discomfort requiring urgent management. In my opinion, this is a condition that a prudent lay person (someone who possesses an average knowledge of health and medicine) may potentially expect to result in complications if not addressed urgently such as respiratory distress, impairment of bodily function or dysfunction of bodily organs.   Routine symptom specific, illness specific and/or disease specific instructions were discussed with the patient and/or caregiver at length.   As such, the patient has been evaluated and assessed, work-up was performed and treatment was provided in alignment with urgent care protocols and evidence based medicine.  Patient/parent/caregiver has been advised that the patient may require follow up for further testing and treatment if the symptoms continue in spite of treatment, as clinically indicated and appropriate.  If the patient was tested for COVID-19, Influenza and/or RSV, then the patient/parent/guardian was advised to isolate at home pending the results of his/her diagnostic coronavirus test and potentially longer if theyre positive. I have also advised pt that if his/her COVID-19 test returns positive, it's recommended to self-isolate for at least 10 days after symptoms first appeared AND until fever-free for 24 hours without fever reducer AND other symptoms have improved or resolved. Discussed self-isolation recommendations as well as instructions for household  member/close contacts as per the [facility] and [place] DHHS, and also gave patient the [place] packet with this information.  Patient/parent/caregiver has been advised to return to the [facility] or PCP in 3-5 days if no better; to PCP or the Emergency Department if new signs and symptoms develop, or if the current signs or symptoms continue to change or worsen for further workup, evaluation and treatment as clinically indicated and appropriate  The patient will follow up with their current PCP if and as advised. If the patient does not currently have a PCP we will assist them in obtaining one.   The patient may need specialty follow up if the symptoms continue, in spite of conservative treatment and management, for further workup, evaluation, consultation and treatment as clinically indicated and appropriate.  Patient/parent/caregiver verbalized understanding and agreement of plan as discussed.  All questions were addressed during visit.  Please see discharge instructions below for further details of plan.  Discharge Instructions: Discharge Instructions   None     This office note has been dictated using Dragon speech recognition software.  Unfortunately, and despite my best efforts, this method of dictation can sometimes lead to occasional typographical or grammatical errors.  I apologize in advance if this occurs.  Lynden Oxford Scales, PA-C 03/02/21 1549

## 2021-03-02 NOTE — ED Triage Notes (Signed)
Pt would like his left ear clean out so that he can pass his hearing test.

## 2021-04-16 ENCOUNTER — Ambulatory Visit (INDEPENDENT_AMBULATORY_CARE_PROVIDER_SITE_OTHER): Payer: 59 | Admitting: Urology

## 2021-04-16 ENCOUNTER — Other Ambulatory Visit: Payer: Self-pay

## 2021-04-16 ENCOUNTER — Encounter: Payer: Self-pay | Admitting: Urology

## 2021-04-16 VITALS — BP 151/96 | HR 99 | Ht 67.0 in | Wt 235.0 lb

## 2021-04-16 DIAGNOSIS — C61 Malignant neoplasm of prostate: Secondary | ICD-10-CM | POA: Diagnosis not present

## 2021-04-16 DIAGNOSIS — R3915 Urgency of urination: Secondary | ICD-10-CM

## 2021-04-16 DIAGNOSIS — N393 Stress incontinence (female) (male): Secondary | ICD-10-CM

## 2021-04-16 DIAGNOSIS — R300 Dysuria: Secondary | ICD-10-CM | POA: Diagnosis not present

## 2021-04-16 DIAGNOSIS — R3129 Other microscopic hematuria: Secondary | ICD-10-CM

## 2021-04-16 DIAGNOSIS — R399 Unspecified symptoms and signs involving the genitourinary system: Secondary | ICD-10-CM

## 2021-04-16 DIAGNOSIS — R350 Frequency of micturition: Secondary | ICD-10-CM | POA: Diagnosis not present

## 2021-04-16 DIAGNOSIS — N3941 Urge incontinence: Secondary | ICD-10-CM | POA: Diagnosis not present

## 2021-04-16 LAB — BLADDER SCAN AMB NON-IMAGING: Scan Result: 66

## 2021-04-16 MED ORDER — MIRABEGRON ER 50 MG PO TB24: 50.0000 mg | ORAL_TABLET | Freq: Every day | ORAL | 2 refills | Status: DC

## 2021-04-16 NOTE — Progress Notes (Signed)
? ?04/16/2021 ?12:45 PM  ? ?Jonathan Austin ?04-Apr-1965 ?[id number] ? ?Referring provider: Collene Leyden, MD ?[place] ?[place],   [address] ? ?Chief Complaint  ?Patient presents with  ? Prostate Cancer  ? ? ?HPI: ?Makani Seckman is a 56 y.o. male who presents with lower urinary tract symptoms and prostate cancer history ? ?Diagnosis Gleason 4+4 adenocarcinoma the prostate in 2021.  PSA was 317 and 12/12 cores positive Gleason 4+4 adenocarcinoma.  Extensive extraprostatic extension and perineural invasion ?CT suspicious for lymphadenopathy; no other evidence of metastatic disease ?Had significant bladder outlet obstruction underwent channel TURP by Dr. Lovena Neighbours in [facility] April 2021 ?Completed IMRT 10/22/2019 ?Underwent ADT initially then lost his insurance and states he did not get his last injection ?No oncology follow-up since December 2021.  States last PSA was low that was checked at [facility] ?1 month history of bothersome lower urinary tract symptoms including frequency, urgency, dysuria.  Some urge and stress incontinence. ? ? ?PMH: ?Past Medical History:  ?Diagnosis Date  ? Alcohol use disorder, severe, dependence ([place])   ? 05-24-2019 recent [facility] admission for suicide ideation and alcohol / cocaine detox 05-04-2019 in epic,  pt stated has been taking naltrexone daily as prescribed since and no alcohol since  ? Cocaine abuse ([place])   ? 05-24-2019  pt had recent [facility] admission, pt stated last used  "crack" 05-03-2019  ? Cocaine abuse with cocaine-induced mood disorder ([employer])   ? Depression   ? History of suicidal ideation   ? multiple [place] admission's;  last one admitted 05-04-2019  ? Hyperplasia of prostate with lower urinary tract symptoms (LUTS)   ? Hypertension   ? followed by pcp  ? OSA on CPAP   ? study 10-02-2012 in epic  ? Phimosis   ? Prostate cancer [facility]) urologist-- dr winter  ? dx 02/ 2021;  localized advanced, gleason 4+4, PSA 317  ? Type 2 diabetes mellitus treated with insulin ([place])    ? followed by pcp   (05-24-2019  per stated checks blood sugar twice daily, fasting sugar-- 180-200)  ? Wears glasses   ? ? ?Surgical History: ?Past Surgical History:  ?Procedure Laterality Date  ? CIRCUMCISION N/A 05/29/2019  ? Procedure: CIRCUMCISION ADULT;  Surgeon: Ceasar Mons, MD;  Location: [facility];  Service: Urology;  Laterality: N/A;  ? PROSTATE BIOPSY    ? TONSILLECTOMY  child  ? TRANSURETHRAL RESECTION OF PROSTATE N/A 05/29/2019  ? Procedure: TRANSURETHRAL RESECTION OF THE PROSTATE (TURP)/ CYSTOSCOPY;  Surgeon: Ceasar Mons, MD;  Location: [facility];  Service: Urology;  Laterality: N/A;  ? ? ?Home Medications:  ?Allergies as of 04/16/2021   ?No Known Allergies ?  ? ?  ?Medication List  ?  ? ?  ? Accurate as of April 16, 2021 12:45 PM. If you have any questions, ask your nurse or doctor.  ?  ?  ? ?  ? ?abiraterone acetate 250 MG tablet ?Commonly known as: ZYTIGA ?Take 4 tablets by mouth daily *Take on an empty stomach 1 hour BEFORE or 2 hours AFTER a meal. ?What changed: See the new instructions. ?  ?amLODipine 10 MG tablet ?Commonly known as: NORVASC ?Take 1 tablet (10 mg total) by mouth daily. For high blood pressure ?  ?aspirin 81 MG EC tablet ?Take 1 tablet (81 mg total) by mouth daily. Swallow whole. ?  ?erythromycin ophthalmic ointment ?Place a 1/2 inch ribbon of ointment into the lower eyelid 4 times daily  for 5 days. ?  ?insulin aspart protamine- aspart (70-30) 100 UNIT/ML injection ?Commonly known as: NOVOLOG MIX 70/30 ?Inject into the skin. ?  ?multivitamin with minerals Tabs tablet ?Take 1 tablet by mouth daily. ?  ?ondansetron 4 MG disintegrating tablet ?Commonly known as: ZOFRAN-ODT ?Take 1 tablet (4 mg total) by mouth every 6 (six) hours as needed for nausea or vomiting. ?  ?thiamine 100 MG tablet ?Take 1 tablet (100 mg total) by mouth daily. ?  ? ?  ? ? ?Allergies: No Known Allergies ? ?Family History: ?Family History  ?Problem  Relation Age of Onset  ? Hypertension Mother   ? Hypertension Father   ? Diabetes Father   ? Prostate cancer Maternal Grandfather 96  ? Breast cancer Neg Hx   ? Colon cancer Neg Hx   ? Pancreatic cancer Neg Hx   ? ? ?Social History:  reports that he has never smoked. He has never used smokeless tobacco. He reports current alcohol use. He reports current drug use. Drugs: Cocaine and Marijuana. ? ? ?Physical Exam: ?BP (!) 151/96   Pulse 99   Ht '5\' 7"'$  (1.702 m)   Wt 235 lb (106.6 kg)   BMI 36.81 kg/m?   ?Constitutional:  Alert and oriented, No acute distress. ?HEENT: [place] AT, moist mucus membranes.  Trachea midline, no masses. ?Cardiovascular: No clubbing, cyanosis, or edema. ?Respiratory: Normal respiratory effort, no increased work of breathing. ?Psychiatric: Normal mood and affect. ? ?Laboratory Data: ? ?Urinalysis ?Dipstick 11-30 RBC ? ? ?Assessment & Plan:   ? ?1. cT3a N1 M0 Gleason 4+4 adenocarcinoma the prostate ?Status post IMRT and incomplete ADT ?It has been over a year since his last PSA ?PSA today and will contact with results ? ?2.  Lower urinary tract symptoms ?Channel TURP prior to radiation therapy ?1 month history of bothersome LUTS ?Bladder scan PVR 66 mL ?Trial Myrbetriq ? ?3.  Microhematuria ?Urine culture ordered ?Potential etiologies include urethral stricture, bladder neck contracture and radiation effects prostate/bladder ?He was contacted regarding the recommendation of CT urogram and cystoscopy ? ? ?Abbie Sons, MD ? ?[place] ?[address], [address] ?[place], [place] [address] ?[phone] ?

## 2021-04-17 ENCOUNTER — Telehealth: Payer: Self-pay | Admitting: Urology

## 2021-04-17 DIAGNOSIS — R3129 Other microscopic hematuria: Secondary | ICD-10-CM

## 2021-04-17 LAB — URINALYSIS, COMPLETE
Bilirubin, UA: NEGATIVE
Ketones, UA: NEGATIVE
Leukocytes,UA: NEGATIVE
Nitrite, UA: NEGATIVE
Specific Gravity, UA: >1.03 — ABNORMAL HIGH (ref 1.005–1.030)
Urobilinogen, Ur: 0.2 mg/dL (ref 0.2–1.0)
pH, UA: 6 (ref 5.0–7.5)

## 2021-04-17 LAB — PSA: Prostate Specific Ag, Serum: 0.1 ng/mL (ref 0.0–4.0)

## 2021-04-17 LAB — MICROSCOPIC EXAMINATION: Bacteria, UA: NONE SEEN

## 2021-04-17 NOTE — Telephone Encounter (Signed)
PSA level looks good at 0.1.  Urinalysis did show an abnormal amount of blood.  Recommend further evaluation with CT urogram and cystoscopy.  A CTU order was entered.  Please schedule a cystoscopy appointment with CT follow-up. ?

## 2021-04-19 NOTE — Telephone Encounter (Signed)
Left message for him to return my call.

## 2021-04-19 NOTE — Telephone Encounter (Signed)
Notified patient as instructed, patient pleased °

## 2021-04-19 NOTE — Telephone Encounter (Signed)
Pt LM on triage line returning call.  ?

## 2021-04-20 LAB — CULTURE, URINE COMPREHENSIVE

## 2021-04-23 ENCOUNTER — Other Ambulatory Visit: Payer: Self-pay | Admitting: *Deleted

## 2021-04-23 ENCOUNTER — Telehealth: Payer: Self-pay | Admitting: *Deleted

## 2021-04-23 MED ORDER — AMOXICILLIN-POT CLAVULANATE 875-125 MG PO TABS: 1.0000 | ORAL_TABLET | Freq: Two times a day (BID) | ORAL | 0 refills | Status: DC

## 2021-04-23 NOTE — Telephone Encounter (Signed)
-----   Message from Abbie Sons, MD sent at 04/22/2021 10:00 PM EDT ----- ?Urine culture showed evidence of infection.  Please send Rx amoxicillin 875 mg twice daily x7 days.  Looks like he is scheduled for follow-up next month ?

## 2021-04-23 NOTE — Telephone Encounter (Signed)
Notified patient as instructed, patient pleased. Discussed follow-up appointments, patient agrees  

## 2021-05-11 ENCOUNTER — Other Ambulatory Visit: Payer: Self-pay

## 2021-05-11 ENCOUNTER — Ambulatory Visit
Admission: RE | Admit: 2021-05-11 | Discharge: 2021-05-11 | Disposition: A | Discharge status: Home / Self Care | Payer: 59 | Source: Ambulatory Visit | Attending: Urology | Admitting: Urology

## 2021-05-11 DIAGNOSIS — R3129 Other microscopic hematuria: Secondary | ICD-10-CM | POA: Diagnosis not present

## 2021-05-11 LAB — POCT I-STAT CREATININE: Creatinine, Ser: 1 mg/dL (ref 0.61–1.24)

## 2021-05-11 MED ORDER — IOHEXOL 300 MG/ML  SOLN
125.0000 mL | Freq: Once | INTRAMUSCULAR | Status: AC | PRN
  Administered 2021-05-11: 125 mL via INTRAVENOUS

## 2021-05-13 ENCOUNTER — Telehealth: Payer: Self-pay | Admitting: *Deleted

## 2021-05-13 ENCOUNTER — Other Ambulatory Visit: Payer: Self-pay | Admitting: *Deleted

## 2021-05-13 MED ORDER — SOLIFENACIN SUCCINATE 10 MG PO TABS: 10.0000 mg | ORAL_TABLET | Freq: Every day | ORAL | 0 refills | Status: DC

## 2021-05-13 NOTE — Telephone Encounter (Signed)
Can send in Rx Solifenacin 10 mg daily #30.  CT scan showed no abnormalities.  Recommend keeping cystoscopy appointment 05/27/2021 ?

## 2021-05-13 NOTE — Telephone Encounter (Signed)
Patient called triage and has been having ongoing urinary leakage, has completed all of myrbetriq as prescribed. Would like to know if there is something else he can take. Denies any other urinary symptoms. Wants to know CT results from 05/11/21 ?

## 2021-05-13 NOTE — Telephone Encounter (Signed)
Notified patient as instructed, patient pleased. Discussed follow-up appointments, patient agrees  

## 2021-05-24 ENCOUNTER — Telehealth: Payer: Self-pay

## 2021-05-24 NOTE — Telephone Encounter (Signed)
Incoming call from pt who states that his urinary leakage is worsening, he has noticed no improvement on Vesicare. He would like to know what his options are, advised pt that he would need to discuss this in person with provider at upcoming appt, pt states that he is unsure he can wait until his appointment, appt moved up to 05/26/21, ok per [facility]. Pt voiced understanding.  ?

## 2021-05-26 ENCOUNTER — Encounter: Payer: Self-pay | Admitting: Urology

## 2021-05-26 ENCOUNTER — Ambulatory Visit: Payer: 59 | Admitting: Urology

## 2021-05-26 VITALS — BP 144/90 | HR 102 | Ht 70.0 in | Wt 237.0 lb

## 2021-05-26 DIAGNOSIS — N99111 Postprocedural bulbous urethral stricture: Secondary | ICD-10-CM | POA: Diagnosis not present

## 2021-05-26 DIAGNOSIS — C61 Malignant neoplasm of prostate: Secondary | ICD-10-CM

## 2021-05-26 DIAGNOSIS — Z8546 Personal history of malignant neoplasm of prostate: Secondary | ICD-10-CM

## 2021-05-26 DIAGNOSIS — R3129 Other microscopic hematuria: Secondary | ICD-10-CM | POA: Diagnosis not present

## 2021-05-26 LAB — URINALYSIS, COMPLETE
Bilirubin, UA: NEGATIVE
Ketones, UA: NEGATIVE
Leukocytes,UA: NEGATIVE
Nitrite, UA: NEGATIVE
RBC, UA: NEGATIVE
Specific Gravity, UA: 1.015 (ref 1.005–1.030)
Urobilinogen, Ur: 0.2 mg/dL (ref 0.2–1.0)
pH, UA: 6.5 (ref 5.0–7.5)

## 2021-05-26 LAB — MICROSCOPIC EXAMINATION: Bacteria, UA: NONE SEEN

## 2021-05-26 NOTE — H&P (View-Only) (Signed)
? ?05/26/2021 ?9:32 AM  ? ?Jonathan Austin ?[birth year] ?[id number] ? ?Referring provider: Collene Leyden, MD ?[address] ?[place],  [place] [address] ? ?Chief Complaint  ?Patient presents with  ? Cysto  ? ? ?HPI: ?56 y.o. male with urinary incontinence and microhematuria presents today for cystoscopy ? ?Refer to my previous note 04/16/2021 ?PSA was 0.1 ?CTU 05/11/2021 showed a stable right adrenal adenoma.  No upper tract abnormality identified bladder wall was thickened ?No improvement in his incontinence with Myrbetriq ?Both stress and urge incontinence ?Prior TURP after radiation therapy for urinary retention ? ? ?PMH: ?Past Medical History:  ?Diagnosis Date  ? Alcohol use disorder, severe, dependence ([place])   ? 05-24-2019 recent [facility] admission for suicide ideation and alcohol / cocaine detox 05-04-2019 in epic,  pt stated has been taking naltrexone daily as prescribed since and no alcohol since  ? Cocaine abuse ([place])   ? 05-24-2019  pt had recent [facility] admission, pt stated last used  "crack" 05-03-2019  ? Cocaine abuse with cocaine-induced mood disorder ([place])   ? Depression   ? History of suicidal ideation   ? multiple [place] admission's;  last one admitted 05-04-2019  ? Hyperplasia of prostate with lower urinary tract symptoms (LUTS)   ? Hypertension   ? followed by pcp  ? OSA on CPAP   ? study 10-02-2012 in epic  ? Phimosis   ? Prostate cancer [facility]) urologist-- dr winter  ? dx 02/ 2021;  localized advanced, gleason 4+4, PSA 317  ? Type 2 diabetes mellitus treated with insulin ([place])   ? followed by pcp   (05-24-2019  per stated checks blood sugar twice daily, fasting sugar-- 180-200)  ? Wears glasses   ? ? ?Surgical History: ?Past Surgical History:  ?Procedure Laterality Date  ? CIRCUMCISION N/A 05/29/2019  ? Procedure: CIRCUMCISION ADULT;  Surgeon: Ceasar Mons, MD;  Location: [facility];  Service: Urology;  Laterality: N/A;  ? PROSTATE BIOPSY    ? TONSILLECTOMY  child  ? TRANSURETHRAL  RESECTION OF PROSTATE N/A 05/29/2019  ? Procedure: TRANSURETHRAL RESECTION OF THE PROSTATE (TURP)/ CYSTOSCOPY;  Surgeon: Ceasar Mons, MD;  Location: [facility];  Service: Urology;  Laterality: N/A;  ? ? ?Home Medications:  ?Allergies as of 05/26/2021   ?No Known Allergies ?  ? ?  ?Medication List  ?  ? ?  ? Accurate as of May 26, 2021  9:32 AM. If you have any questions, ask your nurse or doctor.  ?  ?  ? ?  ? ?STOP taking these medications   ? ?insulin aspart protamine- aspart (70-30) 100 UNIT/ML injection ?Commonly known as: NOVOLOG MIX 70/30 ?Stopped by: Abbie Sons, MD ?  ? ?  ? ?TAKE these medications   ? ?abiraterone acetate 250 MG tablet ?Commonly known as: ZYTIGA ?Take 4 tablets by mouth daily *Take on an empty stomach 1 hour BEFORE or 2 hours AFTER a meal. ?What changed: See the new instructions. ?  ?amLODipine 10 MG tablet ?Commonly known as: NORVASC ?Take 1 tablet (10 mg total) by mouth daily. For high blood pressure ?  ?amoxicillin-clavulanate 875-125 MG tablet ?Commonly known as: AUGMENTIN ?Take 1 tablet by mouth 2 (two) times daily. ?  ?aspirin 81 MG EC tablet ?Take 1 tablet (81 mg total) by mouth daily. Swallow whole. ?  ?erythromycin ophthalmic ointment ?Place a 1/2 inch ribbon of ointment into the lower eyelid 4 times daily for 5 days. ?  ?HumuLIN 70/30  KwikPen (70-30) 100 UNIT/ML KwikPen ?Generic drug: insulin isophane & regular human KwikPen ?8 units in the morning and 14 units in the PM. ?  ?lisinopril 10 MG tablet ?Commonly known as: ZESTRIL ?1 tablet ?  ?mirabegron ER 50 MG Tb24 tablet ?Commonly known as: MYRBETRIQ ?Take 1 tablet (50 mg total) by mouth daily. ?  ?multivitamin with minerals Tabs tablet ?Take 1 tablet by mouth daily. ?  ?ondansetron 4 MG disintegrating tablet ?Commonly known as: ZOFRAN-ODT ?Take 1 tablet (4 mg total) by mouth every 6 (six) hours as needed for nausea or vomiting. ?  ?rosuvastatin 20 MG tablet ?Commonly known as: CRESTOR ?1  tablet ?  ?solifenacin 10 MG tablet ?Commonly known as: VESICARE ?Take 1 tablet (10 mg total) by mouth daily. ?  ?thiamine 100 MG tablet ?Take 1 tablet (100 mg total) by mouth daily. ?  ? ?  ? ? ?Allergies: No Known Allergies ? ?Family History: ?Family History  ?Problem Relation Age of Onset  ? Hypertension Mother   ? Hypertension Father   ? Diabetes Father   ? Prostate cancer Maternal Grandfather 96  ? Breast cancer Neg Hx   ? Colon cancer Neg Hx   ? Pancreatic cancer Neg Hx   ? ? ?Social History:  reports that he has never smoked. He has never used smokeless tobacco. He reports current alcohol use. He reports current drug use. Drugs: Cocaine and Marijuana. ? ? ?Physical Exam: ?BP (!) 144/90   Pulse (!) 102   Ht '5\' 10"'$  (1.778 m)   Wt 237 lb (107.5 kg)   BMI 34.01 kg/m?   ?Constitutional:  Alert and oriented, No acute distress. ?HEENT:  AT, moist mucus membranes.  Trachea midline, no masses. ?Cardiovascular: No clubbing, cyanosis, or edema. ?Respiratory: Normal respiratory effort, no increased work of breathing. ?GI: Abdomen is soft, nontender, nondistended, no abdominal masses ?GU: No CVA tenderness ?Skin: No rashes, bruises or suspicious lesions. ?Neurologic: Grossly intact, no focal deficits, moving all 4 extremities. ?Psychiatric: Normal mood and affect. ? ?Cystoscopy: External genitalia were prepped and draped.  Lidocaine gel was instilled.  Urethra meatus was normal in appearance.  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 ?Laboratory Data: ? ?Urinalysis ?Dipstick/microscopy negative ? ? ?Pertinent Imaging: ?CT images were personally reviewed and interpreted ? ?CT HEMATURIA WORKUP ? ?Narrative ?CLINICAL DATA:  Gross hematuria, dysuria, urinary leakage, history ?of prostate cancer status post surgery and radiation ? ?EXAM: ?CT ABDOMEN AND PELVIS WITHOUT AND WITH CONTRAST ? ?TECHNIQUE: ?Multidetector CT imaging of the abdomen and pelvis was performed ?following the standard  protocol before and following the bolus ?administration of intravenous contrast. ? ?RADIATION DOSE REDUCTION: This exam was performed according to the ?departmental dose-optimization program which includes automated ?exposure control, adjustment of the mA and/or kV according to ?patient size and/or use of iterative reconstruction technique. ? ?CONTRAST:  145m OMNIPAQUE IOHEXOL 300 MG/ML  SOLN ? ?COMPARISON:  04/12/2019 ? ?FINDINGS: ?Lower chest: No acute abnormality.  Coronary artery calcifications. ? ?Hepatobiliary: No solid liver abnormality is seen. No gallstones, ?gallbladder wall thickening, or biliary dilatation. ? ?Pancreas: Unremarkable. No pancreatic ductal dilatation or ?surrounding inflammatory changes. ? ?Spleen: Normal in size without significant abnormality. ? ?Adrenals/Urinary Tract: Stable, definitively benign, macroscopic fat ?containing right adrenal adenoma (series 2, image 21). Kidneys are ?normal, without renal calculi, solid lesion, or hydronephrosis. ?Severely thickened, relatively decompressed urinary bladder (series ?7, image 110). ? ?Stomach/Bowel: Stomach is within normal limits. Appendix appears ?normal. No evidence of bowel wall thickening,  distention, or ?inflammatory changes. ? ?Vascular/Lymphatic: Aortic atherosclerosis. No enlarged abdominal or ?pelvic lymph nodes. Previously noted presacral/perirectal and left ?iliac common lymph nodes are resolved. ? ?Reproductive: No mass. Marking clips within the prostate. Prostate ?is diminished in size compared to prior examination. ? ?Other: Small, fat containing bilateral inguinal hernias. No ascites. ? ?Musculoskeletal: No acute or significant osseous findings. ? ?IMPRESSION: ?1. Severely thickened, relatively decompressed urinary bladder, ?suggestive of nonspecific infectious or inflammatory cystitis, ?possibly related to local radiation therapy. ?2. Prostate is diminished in size compared to prior examination, ?consistent with interval  treatment of prostate malignancy. ?3. Previously noted presacral/perirectal and left iliac common lymph ?nodes are resolved, presumed consistent with treatment response of ?nodal metastatic disease. ?4. Coronary arte

## 2021-05-26 NOTE — Progress Notes (Signed)
? ?05/26/2021 ?9:32 AM  ? ?Jonathan Austin ?1965/08/29 ?[id number] ? ?Referring provider: Collene Leyden, MD ?[place] ?[place],  [place] [address] ? ?Chief Complaint  ?Patient presents with  ? Cysto  ? ? ?HPI: ?56 y.o. male with urinary incontinence and microhematuria presents today for cystoscopy ? ?Refer to my previous note 04/16/2021 ?PSA was 0.1 ?CTU 05/11/2021 showed a stable right adrenal adenoma.  No upper tract abnormality identified bladder wall was thickened ?No improvement in his incontinence with Myrbetriq ?Both stress and urge incontinence ?Prior TURP after radiation therapy for urinary retention ? ? ?PMH: ?Past Medical History:  ?Diagnosis Date  ? Alcohol use disorder, severe, dependence ([place])   ? 05-24-2019 recent [facility] admission for suicide ideation and alcohol / cocaine detox 05-04-2019 in epic,  pt stated has been taking naltrexone daily as prescribed since and no alcohol since  ? Cocaine abuse ([place])   ? 05-24-2019  pt had recent [facility] admission, pt stated last used  "crack" 05-03-2019  ? Cocaine abuse with cocaine-induced mood disorder ([place])   ? Depression   ? History of suicidal ideation   ? multiple [place] admission's;  last one admitted 05-04-2019  ? Hyperplasia of prostate with lower urinary tract symptoms (LUTS)   ? Hypertension   ? followed by pcp  ? OSA on CPAP   ? study 10-02-2012 in epic  ? Phimosis   ? Prostate cancer [facility]) urologist-- dr winter  ? dx 02/ 2021;  localized advanced, gleason 4+4, PSA 317  ? Type 2 diabetes mellitus treated with insulin ([place])   ? followed by pcp   (05-24-2019  per stated checks blood sugar twice daily, fasting sugar-- 180-200)  ? Wears glasses   ? ? ?Surgical History: ?Past Surgical History:  ?Procedure Laterality Date  ? CIRCUMCISION N/A 05/29/2019  ? Procedure: CIRCUMCISION ADULT;  Surgeon: Ceasar Mons, MD;  Location: [facility];  Service: Urology;  Laterality: N/A;  ? PROSTATE BIOPSY    ? TONSILLECTOMY  child  ? TRANSURETHRAL  RESECTION OF PROSTATE N/A 05/29/2019  ? Procedure: TRANSURETHRAL RESECTION OF THE PROSTATE (TURP)/ CYSTOSCOPY;  Surgeon: Ceasar Mons, MD;  Location: [facility];  Service: Urology;  Laterality: N/A;  ? ? ?Home Medications:  ?Allergies as of 05/26/2021   ?No Known Allergies ?  ? ?  ?Medication List  ?  ? ?  ? Accurate as of May 26, 2021  9:32 AM. If you have any questions, ask your nurse or doctor.  ?  ?  ? ?  ? ?STOP taking these medications   ? ?insulin aspart protamine- aspart (70-30) 100 UNIT/ML injection ?Commonly known as: NOVOLOG MIX 70/30 ?Stopped by: Abbie Sons, MD ?  ? ?  ? ?TAKE these medications   ? ?abiraterone acetate 250 MG tablet ?Commonly known as: ZYTIGA ?Take 4 tablets by mouth daily *Take on an empty stomach 1 hour BEFORE or 2 hours AFTER a meal. ?What changed: See the new instructions. ?  ?amLODipine 10 MG tablet ?Commonly known as: NORVASC ?Take 1 tablet (10 mg total) by mouth daily. For high blood pressure ?  ?amoxicillin-clavulanate 875-125 MG tablet ?Commonly known as: AUGMENTIN ?Take 1 tablet by mouth 2 (two) times daily. ?  ?aspirin 81 MG EC tablet ?Take 1 tablet (81 mg total) by mouth daily. Swallow whole. ?  ?erythromycin ophthalmic ointment ?Place a 1/2 inch ribbon of ointment into the lower eyelid 4 times daily for 5 days. ?  ?HumuLIN 70/30  KwikPen (70-30) 100 UNIT/ML KwikPen ?Generic drug: insulin isophane & regular human KwikPen ?8 units in the morning and 14 units in the PM. ?  ?lisinopril 10 MG tablet ?Commonly known as: ZESTRIL ?1 tablet ?  ?mirabegron ER 50 MG Tb24 tablet ?Commonly known as: MYRBETRIQ ?Take 1 tablet (50 mg total) by mouth daily. ?  ?multivitamin with minerals Tabs tablet ?Take 1 tablet by mouth daily. ?  ?ondansetron 4 MG disintegrating tablet ?Commonly known as: ZOFRAN-ODT ?Take 1 tablet (4 mg total) by mouth every 6 (six) hours as needed for nausea or vomiting. ?  ?rosuvastatin 20 MG tablet ?Commonly known as: CRESTOR ?1  tablet ?  ?solifenacin 10 MG tablet ?Commonly known as: VESICARE ?Take 1 tablet (10 mg total) by mouth daily. ?  ?thiamine 100 MG tablet ?Take 1 tablet (100 mg total) by mouth daily. ?  ? ?  ? ? ?Allergies: No Known Allergies ? ?Family History: ?Family History  ?Problem Relation Age of Onset  ? Hypertension Mother   ? Hypertension Father   ? Diabetes Father   ? Prostate cancer Maternal Grandfather 96  ? Breast cancer Neg Hx   ? Colon cancer Neg Hx   ? Pancreatic cancer Neg Hx   ? ? ?Social History:  reports that he has never smoked. He has never used smokeless tobacco. He reports current alcohol use. He reports current drug use. Drugs: Cocaine and Marijuana. ? ? ?Physical Exam: ?BP (!) 144/90   Pulse (!) 102   Ht '5\' 10"'$  (1.778 m)   Wt 237 lb (107.5 kg)   BMI 34.01 kg/m?   ?Constitutional:  Alert and oriented, No acute distress. ?HEENT: [place] AT, moist mucus membranes.  Trachea midline, no masses. ?Cardiovascular: No clubbing, cyanosis, or edema. ?Respiratory: Normal respiratory effort, no increased work of breathing. ?GI: Abdomen is soft, nontender, nondistended, no abdominal masses ?GU: No CVA tenderness ?Skin: No rashes, bruises or suspicious lesions. ?Neurologic: Grossly intact, no focal deficits, moving all 4 extremities. ?Psychiatric: Normal mood and affect. ? ?Cystoscopy: External genitalia were prepped and draped.  Lidocaine gel was instilled.  Urethra meatus was normal in appearance.  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 ?Laboratory Data: ? ?Urinalysis ?Dipstick/microscopy negative ? ? ?Pertinent Imaging: ?CT images were personally reviewed and interpreted ? ?CT HEMATURIA WORKUP ? ?Narrative ?CLINICAL DATA:  Gross hematuria, dysuria, urinary leakage, history ?of prostate cancer status post surgery and radiation ? ?EXAM: ?CT ABDOMEN AND PELVIS WITHOUT AND WITH CONTRAST ? ?TECHNIQUE: ?Multidetector CT imaging of the abdomen and pelvis was performed ?following the standard  protocol before and following the bolus ?administration of intravenous contrast. ? ?RADIATION DOSE REDUCTION: This exam was performed according to the ?departmental dose-optimization program which includes automated ?exposure control, adjustment of the mA and/or kV according to ?patient size and/or use of iterative reconstruction technique. ? ?CONTRAST:  126m OMNIPAQUE IOHEXOL 300 MG/ML  SOLN ? ?COMPARISON:  04/12/2019 ? ?FINDINGS: ?Lower chest: No acute abnormality.  Coronary artery calcifications. ? ?Hepatobiliary: No solid liver abnormality is seen. No gallstones, ?gallbladder wall thickening, or biliary dilatation. ? ?Pancreas: Unremarkable. No pancreatic ductal dilatation or ?surrounding inflammatory changes. ? ?Spleen: Normal in size without significant abnormality. ? ?Adrenals/Urinary Tract: Stable, definitively benign, macroscopic fat ?containing right adrenal adenoma (series 2, image 21). Kidneys are ?normal, without renal calculi, solid lesion, or hydronephrosis. ?Severely thickened, relatively decompressed urinary bladder (series ?7, image 110). ? ?Stomach/Bowel: Stomach is within normal limits. Appendix appears ?normal. No evidence of bowel wall thickening,  distention, or ?inflammatory changes. ? ?Vascular/Lymphatic: Aortic atherosclerosis. No enlarged abdominal or ?pelvic lymph nodes. Previously noted presacral/perirectal and left ?iliac common lymph nodes are resolved. ? ?Reproductive: No mass. Marking clips within the prostate. Prostate ?is diminished in size compared to prior examination. ? ?Other: Small, fat containing bilateral inguinal hernias. No ascites. ? ?Musculoskeletal: No acute or significant osseous findings. ? ?IMPRESSION: ?1. Severely thickened, relatively decompressed urinary bladder, ?suggestive of nonspecific infectious or inflammatory cystitis, ?possibly related to local radiation therapy. ?2. Prostate is diminished in size compared to prior examination, ?consistent with interval  treatment of prostate malignancy. ?3. Previously noted presacral/perirectal and left iliac common lymph ?nodes are resolved, presumed consistent with treatment response of ?nodal metastatic disease. ?4. Coronary arte

## 2021-05-27 ENCOUNTER — Other Ambulatory Visit: Payer: 59 | Admitting: Urology

## 2021-05-28 ENCOUNTER — Other Ambulatory Visit: Payer: Self-pay | Admitting: Urology

## 2021-05-28 DIAGNOSIS — N99111 Postprocedural bulbous urethral stricture: Secondary | ICD-10-CM

## 2021-05-28 NOTE — Progress Notes (Signed)
Surgical Physician Order Form [facility] [place] ? ?* Scheduling expectation : Next Available ? ?*Length of Case: 30 minutes ? ?*Clearance needed: no ? ?*Anticoagulation Instructions: N/A ? ?*Aspirin Instructions: Hold Aspirin ? ?*Post-op visit Date/Instructions:  1 week cath removal ? ?*Diagnosis: Urethral Stricture ? ?*Procedure:  Cysto w/urethral dilation (21308) ? ? ?Additional orders: N/A ? ?-Admit type: OUTpatient ? ?-Anesthesia: Choice ? ?-VTE Prophylaxis Standing Order SCD?s    ?   ?Other:  ? ?-Standing Lab Orders Per Anesthesia   ? ?Lab other: UA&Urine Culture ? ?-Standing Test orders EKG/Chest x-ray per Anesthesia      ? ?Test other:  ? ?- Medications:  Gentamicin per pharmacy ? ?-Other orders:  N/A ? ? ? ?  ? ?

## 2021-05-31 ENCOUNTER — Telehealth: Payer: Self-pay

## 2021-05-31 NOTE — Progress Notes (Signed)
[facility] Posting Form  ? ?Surgery Date/Time: Date: 06/08/2021 ? ?Surgeon: Dr. John Giovanni, MD ? ?Surgery Location: Day Surgery ? ?Inpt ( No  )   Outpt (Yes)   Obs ( No  )  ? ?Diagnosis: Urethral Stricture N99.111 ? ?-CPT: 11552 ? ?Surgery: Cystoscopy with Urethral Dilation ? ?Stop Anticoagulations: N/A, Hold ASA ? ?Cardiac/Medical/Pulmonary Clearance needed: no ? ?*Orders entered into EPIC  Date: 05/31/21  ? ?*Case booked in [place]  Date: 05/31/21 ? ?*Notified pt of Surgery: Date: 05/31/21 ? ?PRE-OP UA & CX: yes, will obtain in clinic on 06/01/2021 ? ?*Placed into Prior Authorization Work Fabio Bering Date: 05/31/21 ? ? ?Assistant/laser/rep:No ? ? ? ? ? ? ? ? ? ? ? ? ? ? ? ?

## 2021-05-31 NOTE — Telephone Encounter (Signed)
I spoke with Jonathan Austin. We have discussed possible surgery dates and Tuesday May 2nd, 2023 was agreed upon by all parties. Patient given information about surgery date, what to expect pre-operatively and post operatively.  ?We discussed that a Pre-Admission Testing office will be calling to set up the pre-op visit that will take place prior to surgery, and that these appointments are typically done over the phone with a Pre-Admissions RN. ? Informed patient that our office will communicate any additional care to be provided after surgery. Patients questions or concerns were discussed during our call. Advised to call our office should there be any additional information, questions or concerns that arise. Patient verbalized understanding.  ? ?

## 2021-06-01 ENCOUNTER — Other Ambulatory Visit: Payer: 59

## 2021-06-01 DIAGNOSIS — N99111 Postprocedural bulbous urethral stricture: Secondary | ICD-10-CM

## 2021-06-01 LAB — URINALYSIS, COMPLETE
Bilirubin, UA: NEGATIVE
Ketones, UA: NEGATIVE
Leukocytes,UA: NEGATIVE
Nitrite, UA: NEGATIVE
Specific Gravity, UA: 1.02 (ref 1.005–1.030)
Urobilinogen, Ur: 0.2 mg/dL (ref 0.2–1.0)
pH, UA: 7 (ref 5.0–7.5)

## 2021-06-01 LAB — MICROSCOPIC EXAMINATION: Bacteria, UA: NONE SEEN

## 2021-06-03 ENCOUNTER — Encounter
Admission: RE | Admit: 2021-06-03 | Discharge: 2021-06-03 | Disposition: A | Discharge status: Home / Self Care | Payer: 59 | Source: Ambulatory Visit | Attending: Urology | Admitting: Urology

## 2021-06-03 ENCOUNTER — Other Ambulatory Visit: Payer: Self-pay

## 2021-06-03 DIAGNOSIS — Z01818 Encounter for other preprocedural examination: Secondary | ICD-10-CM

## 2021-06-03 DIAGNOSIS — I700 Atherosclerosis of aorta: Secondary | ICD-10-CM

## 2021-06-03 DIAGNOSIS — F1420 Cocaine dependence, uncomplicated: Secondary | ICD-10-CM

## 2021-06-03 DIAGNOSIS — I10 Essential (primary) hypertension: Secondary | ICD-10-CM

## 2021-06-03 HISTORY — DX: Gastro-esophageal reflux disease without esophagitis: K21.9

## 2021-06-03 HISTORY — DX: Tachycardia, unspecified: R00.0

## 2021-06-03 HISTORY — DX: Atherosclerosis of aorta: I70.0

## 2021-06-03 HISTORY — DX: Disorder of kidney and ureter, unspecified: N28.9

## 2021-06-03 NOTE — Patient Instructions (Signed)
Your procedure is scheduled on:06-08-21 Tuesday ?Report to the Registration Desk on the 1st floor of the [facility].Then proceed to the 2nd floor Surgery Desk in the [facility] ?To find out your arrival time, please call [phone] between 1PM - 3PM on:06-07-21 Monday ? ?REMEMBER: ?Instructions that are not followed completely may result in serious medical risk, up to and including death; or upon the discretion of your surgeon and anesthesiologist your surgery may need to be rescheduled. ? ?Do not eat food OR drink any liquids after midnight the night before surgery.  ?No gum chewing, lozengers or hard candies. ? ?TAKE THESE MEDICATIONS THE MORNING OF SURGERY WITH A SIP OF WATER: ?-rosuvastatin (CRESTOR) ?-solifenacin (VESICARE)  ? ?Do NOT take any Insulin the morning of your surgery ? ?Bring your CPAP machine with you to the hospital ? ?One week prior to surgery: ?Stop Anti-inflammatories (NSAIDS) such as Advil, Aleve, Ibuprofen, Motrin, Naproxen, Naprosyn and Aspirin based products such as Excedrin, Goodys Powder, BC Powder.You may however, continue to take Tylenol if needed for pain up until the day of surgery. ? ?Stop ANY OVER THE COUNTER supplements/vitamins NOW (06-03-21) until after surgery. ? ?No Alcohol for 24 hours before or after surgery. ? ?No Smoking including e-cigarettes for 24 hours prior to surgery.  ?No chewable tobacco products for at least 6 hours prior to surgery.  ?No nicotine patches on the day of surgery. ? ?Do not use any "recreational" drugs for at least a week prior to your surgery.  ?Please be advised that the combination of cocaine and anesthesia may have negative outcomes, up to and including death. ?If you test positive for cocaine, your surgery will be cancelled. ? ?On the morning of surgery brush your teeth with toothpaste and water, you may rinse your mouth with mouthwash if you wish. ?Do not swallow any toothpaste or mouthwash. ? ?Do not wear jewelry, make-up, hairpins, clips or  nail polish. ? ?Do not wear lotions, powders, or perfumes.  ? ?Do not shave body from the neck down 48 hours prior to surgery just in case you cut yourself which could leave a site for infection.  ?Also, freshly shaved skin may become irritated if using the CHG soap. ? ?Contact lenses, hearing aids and dentures may not be worn into surgery. ? ?Do not bring valuables to the hospital. [facility] is not responsible for any missing/lost belongings or valuables.  ? ?Notify your doctor if there is any change in your medical condition (cold, fever, infection). ? ?Wear comfortable clothing (specific to your surgery type) to the hospital. ? ?After surgery, you can help prevent lung complications by doing breathing exercises.  ?Take deep breaths and cough every 1-2 hours. Your doctor may order a device called an Incentive Spirometer to help you take deep breaths. ?When coughing or sneezing, hold a pillow firmly against your incision with both hands. This is called ?splinting.? Doing this helps protect your incision. It also decreases belly discomfort. ? ?If you are being admitted to the hospital overnight, leave your suitcase in the car. ?After surgery it may be brought to your room. ? ?If you are being discharged the day of surgery, you will not be allowed to drive home. ?You will need a responsible adult (18 years or older) to drive you home and stay with you that night.  ? ?If you are taking public transportation, you will need to have a responsible adult (18 years or older) with you. ?Please confirm with your physician that it  is acceptable to use public transportation.  ? ?Please call the [facility] at [phone] if you have any questions about these instructions. ? ?Surgery Visitation Policy: ? ?Patients undergoing a surgery or procedure may have two family members or support persons with them as long as the person is not COVID-19 positive or experiencing its symptoms.  ? ?

## 2021-06-04 ENCOUNTER — Telehealth: Payer: Self-pay

## 2021-06-04 ENCOUNTER — Encounter
Admission: RE | Admit: 2021-06-04 | Discharge: 2021-06-04 | Disposition: A | Discharge status: Home / Self Care | Payer: 59 | Source: Ambulatory Visit | Attending: Urology | Admitting: Urology

## 2021-06-04 DIAGNOSIS — Z01818 Encounter for other preprocedural examination: Secondary | ICD-10-CM | POA: Diagnosis present

## 2021-06-04 DIAGNOSIS — I10 Essential (primary) hypertension: Secondary | ICD-10-CM | POA: Diagnosis not present

## 2021-06-04 DIAGNOSIS — F1420 Cocaine dependence, uncomplicated: Secondary | ICD-10-CM | POA: Insufficient documentation

## 2021-06-04 DIAGNOSIS — E119 Type 2 diabetes mellitus without complications: Secondary | ICD-10-CM | POA: Diagnosis not present

## 2021-06-04 DIAGNOSIS — I700 Atherosclerosis of aorta: Secondary | ICD-10-CM | POA: Diagnosis not present

## 2021-06-04 LAB — CBC
HCT: 42.5 % (ref 39.0–52.0)
Hemoglobin: 13.7 g/dL (ref 13.0–17.0)
MCH: 27.1 pg (ref 26.0–34.0)
MCHC: 32.2 g/dL (ref 30.0–36.0)
MCV: 84.2 fL (ref 80.0–100.0)
Platelets: 371 10*3/uL (ref 150–400)
RBC: 5.05 MIL/uL (ref 4.22–5.81)
RDW: 14.3 % (ref 11.5–15.5)
WBC: 4.2 10*3/uL (ref 4.0–10.5)
nRBC: 0 % (ref 0.0–0.2)

## 2021-06-04 LAB — CULTURE, URINE COMPREHENSIVE

## 2021-06-04 LAB — URINE DRUG SCREEN, QUALITATIVE (ARMC ONLY)
Amphetamines, Ur Screen: NOT DETECTED
Barbiturates, Ur Screen: NOT DETECTED
Benzodiazepine, Ur Scrn: NOT DETECTED
Cannabinoid 50 Ng, Ur ~~LOC~~: NOT DETECTED
Cocaine Metabolite,Ur ~~LOC~~: NOT DETECTED
MDMA (Ecstasy)Ur Screen: NOT DETECTED
Methadone Scn, Ur: NOT DETECTED
Opiate, Ur Screen: NOT DETECTED
Phencyclidine (PCP) Ur S: NOT DETECTED
Tricyclic, Ur Screen: NOT DETECTED

## 2021-06-04 MED ORDER — CEFUROXIME AXETIL 500 MG PO TABS: 500.0000 mg | ORAL_TABLET | Freq: Two times a day (BID) | ORAL | 0 refills | Status: DC

## 2021-06-04 NOTE — Telephone Encounter (Signed)
Patient notified script sent to pharmacy. 

## 2021-06-04 NOTE — Telephone Encounter (Signed)
-----   Message from Nori Riis, PA-C sent at 06/04/2021  8:54 AM EDT ----- ?Please let Jonathan Austin know that his urine culture was positive for infection and he needs to start Ceftin 500 mg, twice daily x seven days and he needs to start today as he is having his procedure next week.  ?

## 2021-06-08 ENCOUNTER — Encounter: Payer: Self-pay | Admitting: Urology

## 2021-06-08 ENCOUNTER — Encounter
Admission: RE | Disposition: A | Discharge status: Home / Self Care | Payer: Self-pay | Source: Home / Self Care | Attending: Urology

## 2021-06-08 ENCOUNTER — Other Ambulatory Visit: Payer: Self-pay

## 2021-06-08 ENCOUNTER — Ambulatory Visit
Admission: RE | Admit: 2021-06-08 | Discharge: 2021-06-08 | Disposition: A | Discharge status: Home / Self Care | Payer: 59 | Attending: Urology | Admitting: Urology

## 2021-06-08 ENCOUNTER — Other Ambulatory Visit: Payer: Self-pay | Admitting: Urology

## 2021-06-08 ENCOUNTER — Ambulatory Visit: Payer: 59 | Admitting: Urgent Care

## 2021-06-08 DIAGNOSIS — I10 Essential (primary) hypertension: Secondary | ICD-10-CM | POA: Diagnosis not present

## 2021-06-08 DIAGNOSIS — N3946 Mixed incontinence: Secondary | ICD-10-CM | POA: Insufficient documentation

## 2021-06-08 DIAGNOSIS — N35912 Unspecified bulbous urethral stricture, male: Secondary | ICD-10-CM | POA: Insufficient documentation

## 2021-06-08 DIAGNOSIS — Z8546 Personal history of malignant neoplasm of prostate: Secondary | ICD-10-CM | POA: Insufficient documentation

## 2021-06-08 DIAGNOSIS — N401 Enlarged prostate with lower urinary tract symptoms: Secondary | ICD-10-CM | POA: Insufficient documentation

## 2021-06-08 DIAGNOSIS — R3129 Other microscopic hematuria: Secondary | ICD-10-CM | POA: Diagnosis not present

## 2021-06-08 DIAGNOSIS — E119 Type 2 diabetes mellitus without complications: Secondary | ICD-10-CM | POA: Diagnosis not present

## 2021-06-08 DIAGNOSIS — G4730 Sleep apnea, unspecified: Secondary | ICD-10-CM | POA: Diagnosis not present

## 2021-06-08 DIAGNOSIS — K219 Gastro-esophageal reflux disease without esophagitis: Secondary | ICD-10-CM | POA: Diagnosis not present

## 2021-06-08 DIAGNOSIS — N99111 Postprocedural bulbous urethral stricture: Secondary | ICD-10-CM

## 2021-06-08 DIAGNOSIS — Z794 Long term (current) use of insulin: Secondary | ICD-10-CM | POA: Diagnosis not present

## 2021-06-08 HISTORY — PX: CYSTOSCOPY WITH URETHRAL DILATATION: SHX5125

## 2021-06-08 LAB — GLUCOSE, CAPILLARY
Glucose-Capillary: 299 mg/dL — ABNORMAL HIGH (ref 70–99)
Glucose-Capillary: 272 mg/dL — ABNORMAL HIGH (ref 70–99)
Glucose-Capillary: 282 mg/dL — ABNORMAL HIGH (ref 70–99)
Glucose-Capillary: 233 mg/dL — ABNORMAL HIGH (ref 70–99)

## 2021-06-08 SURGERY — CYSTOSCOPY, WITH URETHRAL DILATION
Anesthesia: General

## 2021-06-08 MED ORDER — OXYCODONE HCL 5 MG/5ML PO SOLN: 5.0000 mg | Freq: Once | ORAL | Status: DC | PRN

## 2021-06-08 MED ORDER — LABETALOL HCL 5 MG/ML IV SOLN
INTRAVENOUS | Status: AC
  Filled 2021-06-08: qty 4

## 2021-06-08 MED ORDER — OXYCODONE HCL 5 MG PO TABS: 5.0000 mg | ORAL_TABLET | Freq: Once | ORAL | Status: DC | PRN

## 2021-06-08 MED ORDER — SODIUM CHLORIDE 0.9 % IV SOLN
INTRAVENOUS | Status: DC
Start: 2021-06-08 — End: 2021-06-08

## 2021-06-08 MED ORDER — CHLORHEXIDINE GLUCONATE 0.12 % MT SOLN
OROMUCOSAL | Status: AC
  Administered 2021-06-08: 15 mL via OROMUCOSAL
  Filled 2021-06-08: qty 15

## 2021-06-08 MED ORDER — ACETAMINOPHEN 10 MG/ML IV SOLN: 1000.0000 mg | Freq: Once | INTRAVENOUS | Status: DC | PRN

## 2021-06-08 MED ORDER — INSULIN ASPART 100 UNIT/ML IJ SOLN
INTRAMUSCULAR | Status: AC
  Filled 2021-06-08: qty 1

## 2021-06-08 MED ORDER — CHLORHEXIDINE GLUCONATE 0.12 % MT SOLN: 15.0000 mL | Freq: Once | OROMUCOSAL | Status: AC

## 2021-06-08 MED ORDER — FAMOTIDINE 20 MG PO TABS: 20.0000 mg | ORAL_TABLET | Freq: Once | ORAL | Status: AC

## 2021-06-08 MED ORDER — LIDOCAINE HCL (CARDIAC) PF 100 MG/5ML IV SOSY
PREFILLED_SYRINGE | INTRAVENOUS | Status: DC | PRN
  Administered 2021-06-08: 100 mg via INTRAVENOUS

## 2021-06-08 MED ORDER — FENTANYL CITRATE (PF) 100 MCG/2ML IJ SOLN
INTRAMUSCULAR | Status: AC
  Filled 2021-06-08: qty 2

## 2021-06-08 MED ORDER — INSULIN REGULAR HUMAN 100 UNIT/ML IJ SOLN: 8.0000 [IU] | Freq: Once | INTRAMUSCULAR | Status: DC

## 2021-06-08 MED ORDER — FENTANYL CITRATE (PF) 100 MCG/2ML IJ SOLN
INTRAMUSCULAR | Status: DC | PRN
  Administered 2021-06-08: 25 ug via INTRAVENOUS

## 2021-06-08 MED ORDER — INSULIN ASPART 100 UNIT/ML IJ SOLN
8.0000 [IU] | Freq: Once | INTRAMUSCULAR | Status: AC
  Administered 2021-06-08: 8 [IU] via INTRAVENOUS

## 2021-06-08 MED ORDER — MIDAZOLAM HCL 2 MG/2ML IJ SOLN
INTRAMUSCULAR | Status: DC | PRN
  Administered 2021-06-08: 2 mg via INTRAVENOUS

## 2021-06-08 MED ORDER — INSULIN ASPART 100 UNIT/ML IJ SOLN: 8.0000 [IU] | Freq: Once | INTRAMUSCULAR | Status: DC

## 2021-06-08 MED ORDER — PHENYLEPHRINE HCL (PRESSORS) 10 MG/ML IV SOLN
INTRAVENOUS | Status: DC | PRN
  Administered 2021-06-08: 160 ug via INTRAVENOUS

## 2021-06-08 MED ORDER — ESMOLOL HCL 100 MG/10ML IV SOLN
INTRAVENOUS | Status: DC | PRN
  Administered 2021-06-08: 20 mg via INTRAVENOUS

## 2021-06-08 MED ORDER — TRAMADOL HCL 50 MG PO TABS: 50.0000 mg | ORAL_TABLET | Freq: Four times a day (QID) | ORAL | 0 refills | Status: DC | PRN

## 2021-06-08 MED ORDER — ACETAMINOPHEN 10 MG/ML IV SOLN
INTRAVENOUS | Status: DC | PRN
Start: 2021-06-08 — End: 2021-06-08
  Administered 2021-06-08: 1000 mg via INTRAVENOUS

## 2021-06-08 MED ORDER — SEVOFLURANE IN SOLN
RESPIRATORY_TRACT | Status: AC
  Filled 2021-06-08: qty 250

## 2021-06-08 MED ORDER — MIDAZOLAM HCL 2 MG/2ML IJ SOLN
INTRAMUSCULAR | Status: AC
  Filled 2021-06-08: qty 2

## 2021-06-08 MED ORDER — GENTAMICIN SULFATE 40 MG/ML IJ SOLN
5.0000 mg/kg | INTRAVENOUS | Status: AC
  Administered 2021-06-08: 540 mg via INTRAVENOUS
  Filled 2021-06-08: qty 13.5

## 2021-06-08 MED ORDER — ONDANSETRON HCL 4 MG/2ML IJ SOLN: 4.0000 mg | Freq: Once | INTRAMUSCULAR | Status: DC | PRN

## 2021-06-08 MED ORDER — ORAL CARE MOUTH RINSE: 15.0000 mL | Freq: Once | OROMUCOSAL | Status: AC

## 2021-06-08 MED ORDER — PROPOFOL 10 MG/ML IV BOLUS
INTRAVENOUS | Status: DC | PRN
  Administered 2021-06-08: 120 mg via INTRAVENOUS

## 2021-06-08 MED ORDER — LACTATED RINGERS IV SOLN: INTRAVENOUS | Status: DC

## 2021-06-08 MED ORDER — FAMOTIDINE 20 MG PO TABS
ORAL_TABLET | ORAL | Status: AC
  Administered 2021-06-08: 20 mg via ORAL
  Filled 2021-06-08: qty 1

## 2021-06-08 MED ORDER — FENTANYL CITRATE (PF) 100 MCG/2ML IJ SOLN: 25.0000 ug | INTRAMUSCULAR | Status: DC | PRN

## 2021-06-08 MED ORDER — LABETALOL HCL 5 MG/ML IV SOLN
INTRAVENOUS | Status: DC | PRN
  Administered 2021-06-08: 5 mg via INTRAVENOUS

## 2021-06-08 MED ORDER — ONDANSETRON HCL 4 MG/2ML IJ SOLN
INTRAMUSCULAR | Status: DC | PRN
  Administered 2021-06-08 (×2): 4 mg via INTRAVENOUS

## 2021-06-08 MED ORDER — SODIUM CHLORIDE 0.9 % IR SOLN
Status: DC | PRN
  Administered 2021-06-08: 1000 mL via INTRAVESICAL

## 2021-06-08 MED ORDER — GLYCOPYRROLATE 0.2 MG/ML IJ SOLN
INTRAMUSCULAR | Status: DC | PRN
  Administered 2021-06-08: .2 mg via INTRAVENOUS

## 2021-06-08 MED ORDER — INSULIN ASPART 100 UNIT/ML IJ SOLN
8.0000 [IU] | Freq: Once | INTRAMUSCULAR | Status: AC
Start: 2021-06-08 — End: 2021-06-08
  Administered 2021-06-08: 8 [IU] via INTRAVENOUS

## 2021-06-08 SURGICAL SUPPLY — 22 items
BAG URINE DRAIN 2000ML AR STRL (UROLOGICAL SUPPLIES) ×2 IMPLANT
BALLN OPTILUME DCB 24X5X75 (BALLOONS)
BALLN OPTILUME DCB 30X3X75 (BALLOONS)
BALLN OPTILUME DCB 30X5X75 (BALLOONS)
BALLOON OPTILUME DCB 24X5X75 (BALLOONS) IMPLANT
BALLOON OPTILUME DCB 30X3X75 (BALLOONS) IMPLANT
BALLOON OPTILUME DCB 30X5X75 (BALLOONS) IMPLANT
CATH FOL 2WAY LX 16X5 (CATHETERS) ×2 IMPLANT
CATH FOLEY 2W COUNCIL 20FR 5CC (CATHETERS) ×1 IMPLANT
CATH SET URETHRAL DILATOR (CATHETERS) ×2 IMPLANT
ELECT REM PT RETURN 9FT ADLT (ELECTROSURGICAL)
ELECTRODE REM PT RTRN 9FT ADLT (ELECTROSURGICAL) ×1 IMPLANT
GLOVE SURG UNDER POLY LF SZ7.5 (GLOVE) ×2 IMPLANT
GOWN STRL REUS W/ TWL XL LVL3 (GOWN DISPOSABLE) ×2 IMPLANT
GOWN STRL REUS W/TWL XL LVL3 (GOWN DISPOSABLE) ×2
GUIDEWIRE STR DUAL SENSOR (WIRE) ×2 IMPLANT
HOLDER FOLEY CATH W/STRAP (MISCELLANEOUS) ×2 IMPLANT
PACK CYSTO AR (MISCELLANEOUS) ×2 IMPLANT
SET CYSTO W/LG BORE CLAMP LF (SET/KITS/TRAYS/PACK) ×2 IMPLANT
SYR 30ML LL (SYRINGE) ×2 IMPLANT
WATER STERILE IRR 3000ML UROMA (IV SOLUTION) ×2 IMPLANT
WATER STERILE IRR 500ML POUR (IV SOLUTION) ×2 IMPLANT

## 2021-06-08 NOTE — Anesthesia Procedure Notes (Signed)
Procedure Name: LMA Insertion ?Date/Time: 06/08/2021 12:37 PM ?Performed by: Rosine Door, RN ?Pre-anesthesia Checklist: Patient identified, Emergency Drugs available, Suction available, Patient being monitored and Timeout performed ?Patient Re-evaluated:Patient Re-evaluated prior to induction ?Oxygen Delivery Method: Circle system utilized ?Preoxygenation: Pre-oxygenation with 100% oxygen ?Induction Type: IV induction ?LMA: LMA inserted ?LMA Size: 4.0 ?Number of attempts: 1 ?Dental Injury: Teeth and Oropharynx as per pre-operative assessment  ? ? ? ? ?

## 2021-06-08 NOTE — Progress Notes (Signed)
DR Erenest Rasher aware of BP and blood sugar, ok to dc patient ?

## 2021-06-08 NOTE — Progress Notes (Signed)
Dr Erenest Rasher aware of BP elevated in pacu and blood sugar results ?

## 2021-06-08 NOTE — Discharge Instructions (Addendum)
AMBULATORY SURGERY  ?DISCHARGE INSTRUCTIONS ? ? ?The drugs that you were given will stay in your system until tomorrow so for the next 24 hours you should not: ? ?Drive an automobile ?Make any legal decisions ?Drink any alcoholic beverage ? ? ?You may resume regular meals tomorrow.  Today it is better to start with liquids and gradually work up to solid foods. ? ?You may eat anything you prefer, but it is better to start with liquids, then soup and crackers, and gradually work up to solid foods. ? ? ?Please notify your doctor immediately if you have any unusual bleeding, trouble breathing, redness and pain at the surgery site, drainage, fever, or pain not relieved by medication. ? ? ? ?Additional Instructions: ? ?Cystoscopy patient instructions ? ?Following a cystoscopy, a catheter (a flexible rubber tube) is sometimes left in place to empty the bladder. This may cause some discomfort or a feeling that you need to urinate. Your doctor determines the period of time that the catheter will be left in place. ?You may have bloody urine for two to three days (Call your doctor if the amount of bleeding increases or does not subside). ? ?You may pass blood clots in your urine, especially if you had a biopsy. It is not unusual to pass small blood clots and have some bloody urine a couple of weeks after your cystoscopy. Again, call your doctor if the bleeding does not subside. ?You may have: ?Dysuria (painful urination) ?Frequency (urinating often) ?Urgency (strong desire to urinate) ? ?These symptoms are common especially if medicine is instilled into the bladder or a ureteral stent is placed. Avoiding alcohol and caffeine, such as coffee, tea, and chocolate, may help relieve these symptoms. Drink plenty of water, unless otherwise instructed. Your doctor may also prescribe an antibiotic or other medicine to reduce these symptoms. ? ?Cystoscopy results are available soon after the procedure; biopsy results usually take two to  four days. Your doctor will discuss the results of your exam with you. Before you go home, you will be given specific instructions for follow-up care. ?Special Instructions: ? ? If you are going home with a catheter in place do not take a tub bath until removed by your doctor.  ? You may resume your normal activities.  ? Do not drive or operate machinery if you are taking narcotic pain medicine.  ? Be sure to keep all follow-up appointments with your doctor.  ? ?Call Your Doctor If: The catheter is not draining ?You have severe pain ?You are unable to urinate ?You have a fever over 101 ?You have severe bleeding ? ?      6       you will be contacted this week for an appointment for catheter removal on Monday, 06/14/2021 ? ?        ? ? ? ? ?Please contact your physician with any problems or Same Day Surgery at [phone], Monday through Friday 6 am to 4 pm, or  at [facility] number at [phone].  ?

## 2021-06-08 NOTE — Anesthesia Preprocedure Evaluation (Addendum)
Anesthesia Evaluation  ?Patient identified by MRN, date of birth, ID band ?Patient awake ? ? ? ?Reviewed: ?Allergy & Precautions, NPO status , Patient's Chart, lab work & pertinent test results ? ?History of Anesthesia Complications ?Negative for: history of anesthetic complications ? ?Airway ?Mallampati: III ? ? ?Neck ROM: Full ? ? ? Dental ? ?(+) Missing, Chipped ?  ?Pulmonary ?sleep apnea and Continuous Positive Airway Pressure Ventilation ,  ?  ?Pulmonary exam normal ?breath sounds clear to auscultation ? ? ? ? ? ? Cardiovascular ?hypertension, Normal cardiovascular exam ?Rhythm:Regular Rate:Normal ? ?ECG 06/04/21:  ?Sinus tachycardia (HR 110) ?Otherwise normal ECG ?When compared with ECG of 25-Aug-2019 02:00, ?No significant change since last tracing ?  ?Neuro/Psych ?Polysubstance use disorder including cocaine, alcohol, marijuana; pt states last use of all was 2 years ago ?  ? GI/Hepatic ?GERD  ,  ?Endo/Other  ?diabetes, Poorly Controlled, Type 2, Insulin DependentObesity  ? Renal/GU ?negative Renal ROS  ? ?Prostate CA ? ?  ?Musculoskeletal ? ? Abdominal ?  ?Peds ? Hematology ?negative hematology ROS ?(+)   ?Anesthesia Other Findings ? ? Reproductive/Obstetrics ? ?  ? ? ? ? ? ? ? ? ? ? ? ? ? ?  ?  ? ? ? ? ? ? ? ?Anesthesia Physical ?Anesthesia Plan ? ?ASA: 3 ? ?Anesthesia Plan: General  ? ?Post-op Pain Management:   ? ?Induction: Intravenous ? ?PONV Risk Score and Plan: 2 and Ondansetron, Dexamethasone and Treatment may vary due to age or medical condition ? ?Airway Management Planned: Oral ETT ? ?Additional Equipment:  ? ?Intra-op Plan:  ? ?Post-operative Plan: Extubation in OR ? ?Informed Consent: I have reviewed the patients History and Physical, chart, labs and discussed the procedure including the risks, benefits and alternatives for the proposed anesthesia with the patient or authorized representative who has indicated his/her understanding and acceptance.  ? ? ? ?Dental  advisory given ? ?Plan Discussed with: CRNA ? ?Anesthesia Plan Comments: (Patient consented for risks of anesthesia including but not limited to:  ?- adverse reactions to medications ?- damage to eyes, teeth, lips or other oral mucosa ?- nerve damage due to positioning  ?- sore throat or hoarseness ?- damage to heart, brain, nerves, lungs, other parts of body or loss of life ? ?Informed patient about role of CRNA in peri- and intra-operative care.  Patient voiced understanding.)  ? ? ? ? ? ? ?Anesthesia Quick Evaluation ? ?

## 2021-06-08 NOTE — Interval H&P Note (Signed)
History and Physical Interval Note: ? ?06/08/2021 ?12:26 PM ? ?Jonathan Austin  has presented today for surgery, with the diagnosis of Urethral Stricture.  The various methods of treatment have been discussed with the patient and family. After consideration of risks, benefits and other options for treatment, the patient has consented to  Procedure(s): ?CYSTOSCOPY WITH URETHRAL DILATATION (N/A) as a surgical intervention.  The patient's history has been reviewed, patient examined, no change in status, stable for surgery.  I have reviewed the patient's chart and labs.  Questions were answered to the patient's satisfaction.   ? ? ?[name] C Savvy Peeters ? ? ?

## 2021-06-08 NOTE — Transfer of Care (Signed)
Immediate Anesthesia Transfer of Care Note ? ?Patient: Jonathan Austin ? ?Procedure(s) Performed: CYSTOSCOPY WITH URETHRAL DILATATION ? ?Patient Location: PACU ? ?Anesthesia Type:General ? ?Level of Consciousness: awake, drowsy and patient cooperative ? ?Airway & Oxygen Therapy: Patient Spontanous Breathing and Patient connected to face mask oxygen ? ?Post-op Assessment: Report given to RN and Post -op Vital signs reviewed and stable ? ?Post vital signs: Reviewed, labetolol '5mg'$  given iv per MDA, will cont to monitor [facility] CRNA  ? ?Last Vitals:  ?Vitals Value Taken Time  ?BP 197/137 06/08/21 1315  ?Temp 36.3 ?C 06/08/21 1315  ?Pulse 117 06/08/21 1318  ?Resp 14 06/08/21 1318  ?SpO2 99 % 06/08/21 1318  ?Vitals shown include unvalidated device data. ? ?Last Pain:  ?Vitals:  ? 06/08/21 1315  ?TempSrc:   ?PainSc: 0-No pain  ?   ? ?Patients Stated Pain Goal: 0 (06/08/21 1011) ? ?Complications: No notable events documented. ?

## 2021-06-09 ENCOUNTER — Encounter: Payer: Self-pay | Admitting: Urology

## 2021-06-09 NOTE — Anesthesia Postprocedure Evaluation (Signed)
Anesthesia Post Note ? ?Patient: Jonathan Austin ? ?Procedure(s) Performed: CYSTOSCOPY WITH URETHRAL DILATATION ? ?Patient location during evaluation: PACU ?Anesthesia Type: General ?Level of consciousness: awake and alert, oriented and patient cooperative ?Pain management: pain level controlled ?Vital Signs Assessment: post-procedure vital signs reviewed and stable ?Respiratory status: spontaneous breathing, nonlabored ventilation and respiratory function stable ?Cardiovascular status: blood pressure returned to baseline and stable ?Postop Assessment: adequate PO intake ?Anesthetic complications: no ? ? ?No notable events documented. ? ? ?Last Vitals:  ?Vitals:  ? 06/08/21 1348 06/08/21 1404  ?BP: (!) 147/99 (!) 140/100  ?Pulse: (!) 103 98  ?Resp: (!) 21 20  ?Temp: (!) 36.1 ?C (!) 36.2 ?C  ?SpO2: 92% 96%  ?  ?Last Pain:  ?Vitals:  ? 06/08/21 1404  ?TempSrc: Temporal  ?PainSc: 0-No pain  ? ? ?  ?  ?  ?  ?  ?  ? ?Darrin Nipper ? ? ? ? ?

## 2021-06-12 NOTE — Op Note (Signed)
Preoperative diagnosis:  ?Bulbar urethral stricture ? ?Postoperative diagnosis:  ?Prostatic urethral stricture ? ?Procedure: ?Cystoscopy with dilation of urethral stricture ? ?Surgeon: Abbie Sons, MD ? ?Anesthesia: General ? ?Complications: None ? ?Intraoperative findings:  ?~ 10 French stricture noted proximal bulbar urethra ?Bladder mucosa with mild scattered erythema consistent with radiation change.  No solid or papillary lesions ?Bladder neck open though prostatic urethral changes consistent with a dilated stricture ? ?EBL: Minimal ? ?Specimens: None ? ?Indication: Jonathan Austin is a 56 y.o. 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  He had significant outlet obstruction and underwent channel TURP in [facility] 05/2019 and subsequently treated with IMRT + ADT.  Recently with worsening lower urinary tract symptoms including decreased urine frequency, urgency and urinary incontinence.  Office cystoscopy remarkable for stricture in the area of the proximal bulbar urethra.  After reviewing the management options for treatment, he elected to proceed with the above surgical procedure(s). We have discussed the potential benefits and risks of the procedure, side effects of the proposed treatment, the likelihood of the patient achieving the goals of the procedure, and any potential problems that might occur during the procedure or recuperation. Informed consent has been obtained. ? ?Description of procedure: ? ?The patient was taken to the operating room and general anesthesia was induced.  The patient was placed in the dorsal lithotomy position, prepped and draped in the usual sterile fashion, and preoperative antibiotics were administered. A preoperative time-out was performed.  ? ?A 21 French cystoscope was lubricated, passed per urethra and advanced proximally under direct vision with findings as described above. ? ?A 0.038 Sensor wire was placed through the cystoscope  and advanced to the stricture into the bladder without difficulty. ? ?The cystoscope was removed and the stricture was dilated with disposable urethral dilators from 10-24 [place].  Resistance was felt through the region of the prostatic urethra. ? ?Once dilated the cystoscope was repassed with findings as described above.  No significant bleeding was noted ? ?A 20 French [place] catheter was then placed without difficulty and the balloon was inflated with 10 cc sterile water and placed to gravity drainage. ? ?Plan: ?Follow-up office visit 06/14/2021 for catheter removal ?Postop follow-up scheduled 06/30/2021 ? ? ? ?Abbie Sons, M.D. ? ?

## 2021-06-15 ENCOUNTER — Ambulatory Visit (INDEPENDENT_AMBULATORY_CARE_PROVIDER_SITE_OTHER): Payer: 59 | Admitting: Physician Assistant

## 2021-06-15 DIAGNOSIS — N99111 Postprocedural bulbous urethral stricture: Secondary | ICD-10-CM

## 2021-06-15 NOTE — Progress Notes (Signed)
Catheter Removal ? ?Patient is present today for a catheter removal.  52m of water was drained from the balloon. A 16FR foley cath was removed from the bladder no complications were noted . Patient tolerated well. ? ?Performed by: JGaspar ColaCMA ? ?Follow up/ Additional notes: as scheduled post op appt.   ?

## 2021-06-16 ENCOUNTER — Telehealth: Payer: Self-pay

## 2021-06-16 NOTE — Telephone Encounter (Signed)
Followed up w pt regarding yesterdays after hours nurse call. Pt reports he is leaking uncontrollably after having his catheter removed yesterday. Pt denies any other urinary symptoms. Advised pt he may need to continue wearing depends until his post op appt with Dr Bernardo Heater on 5/17. Pt expressed understanding.  ?

## 2021-06-30 ENCOUNTER — Ambulatory Visit (INDEPENDENT_AMBULATORY_CARE_PROVIDER_SITE_OTHER): Payer: 59 | Admitting: Urology

## 2021-06-30 ENCOUNTER — Encounter: Payer: Self-pay | Admitting: Urology

## 2021-06-30 VITALS — BP 139/88 | HR 102 | Ht 70.0 in | Wt 230.0 lb

## 2021-06-30 DIAGNOSIS — N99111 Postprocedural bulbous urethral stricture: Secondary | ICD-10-CM

## 2021-06-30 DIAGNOSIS — C61 Malignant neoplasm of prostate: Secondary | ICD-10-CM

## 2021-06-30 DIAGNOSIS — N35812 Other urethral bulbous stricture, male: Secondary | ICD-10-CM | POA: Diagnosis not present

## 2021-06-30 DIAGNOSIS — N3946 Mixed incontinence: Secondary | ICD-10-CM | POA: Diagnosis not present

## 2021-06-30 DIAGNOSIS — Z8546 Personal history of malignant neoplasm of prostate: Secondary | ICD-10-CM | POA: Diagnosis not present

## 2021-06-30 LAB — BLADDER SCAN AMB NON-IMAGING: Scan Result: 19

## 2021-06-30 NOTE — Progress Notes (Signed)
06/30/2021 9:27 AM   Jonathan Austin 04/11/1965 [id number]  Referring provider: Collene Leyden, MD [address] [place],  [place] [address]  Chief Complaint  Patient presents with   Routine Post Op    Urologic history: 1.  Prostate cancer High risk prostate cancer diagnosed [facility] [place] 2021; PSA 317, 12/12 cores positive Gleason 4+4 adenocarcinoma with extensive extraprostatic extension and perineural invasion; CT suspicious for lymphadenopathy; no distant mets Significant outlet obstruction pretreatment and underwent channel TURP by Dr. Lovena Neighbours 05/2019 Completed IMRT 10/22/2019 Was on ADT however did not get his last injection due to loss of insurance PSA 04/16/2021 was 0.1  2.  Urinary incontinence Both stress and urge incontinence post radiation No improvement on Solifenacin, Myrbetriq  3.  Bulbar/prostatic urethral stricture Dilation 06/08/2021  4.  Erectile dysfunction  HPI: 56 y.o. male presents for postop follow-up.  Bulbar/prostatic urethral stricture dilated 06/08/2021 Urinary incontinence persists post dilation but has not worsened At times has constant leakage of urine, exacerbated by stress Intermittent urge incontinence Also inquiring about options for ED.  PDE 5 inhibitor refractory   PMH: Past Medical History:  Diagnosis Date   Alcohol use disorder, severe, dependence ([place])    05-24-2019 recent [facility] admission for suicide ideation and alcohol / cocaine detox 05-04-2019 in epic,  pt stated has been taking naltrexone daily as prescribed since and no alcohol since   Cocaine abuse ([place])    05-24-2019  pt had recent [facility] admission, pt stated last used  "crack" 05-03-2019   Cocaine abuse with cocaine-induced mood disorder ([place])    Depression    GERD (gastroesophageal reflux disease)    History of suicidal ideation    multiple [facility] admission's;  last one admitted 05-04-2019   Hyperplasia of prostate with lower urinary tract symptoms (LUTS)     Hypertension    followed by pcp   OSA on CPAP    uses intermittently   Phimosis    Prostate cancer [facility]) urologist-- dr winter   dx 02/ 2021;  localized advanced, gleason 4+4, PSA 317   Renal insufficiency    Sinus tachycardia    Thoracic aortic atherosclerosis ([place])    Type 2 diabetes mellitus treated with insulin ([place])    followed by pcp   (05-24-2019  per stated checks blood sugar twice daily, fasting sugar-- 180-200)   Wears glasses     Surgical History: Past Surgical History:  Procedure Laterality Date   CIRCUMCISION N/A 05/29/2019   Procedure: CIRCUMCISION ADULT;  Surgeon: Ceasar Mons, MD;  Location: [facility];  Service: Urology;  Laterality: N/A;   CYSTOSCOPY WITH URETHRAL DILATATION N/A 06/08/2021   Procedure: CYSTOSCOPY WITH URETHRAL DILATATION;  Surgeon: Abbie Sons, MD;  Location: [facility];  Service: Urology;  Laterality: N/A;   PROSTATE BIOPSY     TONSILLECTOMY  child   TRANSURETHRAL RESECTION OF PROSTATE N/A 05/29/2019   Procedure: TRANSURETHRAL RESECTION OF THE PROSTATE (TURP)/ CYSTOSCOPY;  Surgeon: Ceasar Mons, MD;  Location: [facility];  Service: Urology;  Laterality: N/A;    Home Medications:  Allergies as of 06/30/2021   No Known Allergies      Medication List        Accurate as of Jun 30, 2021  9:27 AM. If you have any questions, ask your nurse or doctor.          STOP taking these medications    insulin aspart protamine- aspart (70-30) 100 UNIT/ML injection Commonly known  as: NOVOLOG MIX 70/30 Stopped by: Abbie Sons, MD       TAKE these medications    acetaminophen 325 MG tablet Commonly known as: TYLENOL Take 650 mg by mouth every 6 (six) hours as needed for moderate pain.   amLODipine 10 MG tablet Commonly known as: NORVASC Take 1 tablet (10 mg total) by mouth daily. For high blood pressure   calcium carbonate 500 MG chewable tablet Commonly known as: TUMS - dosed in  mg elemental calcium Chew 1 tablet by mouth as needed for indigestion or heartburn.   cefUROXime 500 MG tablet Commonly known as: CEFTIN Take 1 tablet (500 mg total) by mouth 2 (two) times daily with a meal.   HumuLIN 70/30 KwikPen (70-30) 100 UNIT/ML KwikPen Generic drug: insulin isophane & regular human KwikPen 15 units in the morning and 20 units in the pm.   lisinopril 10 MG tablet Commonly known as: ZESTRIL Take 10 mg by mouth every morning.   rosuvastatin 20 MG tablet Commonly known as: CRESTOR Take 20 mg by mouth every morning.   solifenacin 10 MG tablet Commonly known as: VESICARE Take 1 tablet (10 mg total) by mouth daily. What changed: when to take this   traMADol 50 MG tablet Commonly known as: ULTRAM Take 1 tablet (50 mg total) by mouth every 6 (six) hours as needed for moderate pain.        Allergies: No Known Allergies  Family History: Family History  Problem Relation Age of Onset   Hypertension Mother    Hypertension Father    Diabetes Father    Prostate cancer Maternal Grandfather 31   Breast cancer Neg Hx    Colon cancer Neg Hx    Pancreatic cancer Neg Hx     Social History:  reports that he has never smoked. He has never used smokeless tobacco. He reports that he does not currently use alcohol. He reports that he does not currently use drugs after having used the following drugs: Cocaine and Marijuana.   Physical Exam: BP 139/88   Pulse (!) 102   Ht '5\' 10"'$  (1.778 m)   Wt 230 lb (104.3 kg)   BMI 33.00 kg/m   Constitutional:  Alert and oriented, No acute distress. HEENT: [place] AT, moist mucus membranes.  Trachea midline, no masses. Cardiovascular: No clubbing, cyanosis, or edema. Respiratory: Normal respiratory effort, no increased work of breathing. Psychiatric: Normal mood and affect.   Assessment & Plan:    1.  Bulbar/prostatic urethral stricture PVR 19 mL We will need to monitor for recurrence  2.  Mixed urinary  incontinence Difficult to determine his most bothersome symptom No improvement Solifenacin and Myrbetriq Not a good candidate for an artificial sphincter due to urethral stricture and prior radiation Will schedule appointment with Dr. Matilde Sprang for a second opinion  3.  Erectile dysfunction Discussed intracavernosal injections and vacuum erection devices.  He was provided literature   Abbie Sons, MD  [place] [address], [place], [place] [address] [phone]

## 2021-09-06 ENCOUNTER — Encounter: Payer: Self-pay | Admitting: Urology

## 2021-09-06 ENCOUNTER — Other Ambulatory Visit: Payer: Self-pay | Admitting: Family Medicine

## 2021-09-06 ENCOUNTER — Ambulatory Visit: Payer: 59 | Admitting: Urology

## 2021-09-06 VITALS — BP 139/91 | HR 99 | Ht 67.0 in | Wt 239.0 lb

## 2021-09-06 DIAGNOSIS — N3946 Mixed incontinence: Secondary | ICD-10-CM

## 2021-09-06 LAB — URINALYSIS, COMPLETE
Bilirubin, UA: NEGATIVE
Glucose, UA: NEGATIVE
Ketones, UA: NEGATIVE
Leukocytes,UA: NEGATIVE
Nitrite, UA: NEGATIVE
Specific Gravity, UA: 1.025 (ref 1.005–1.030)
Urobilinogen, Ur: 0.2 mg/dL (ref 0.2–1.0)
pH, UA: 5.5 (ref 5.0–7.5)

## 2021-09-06 LAB — MICROSCOPIC EXAMINATION

## 2021-09-06 MED ORDER — AMBULATORY NON FORMULARY MEDICATION: 0 refills | Status: DC

## 2021-09-06 NOTE — Progress Notes (Signed)
09/06/2021 10:41 AM   [name] May 18, 1965 [id number]  Referring provider: Collene Leyden, MD [address] [place],  [place] [address]  Chief Complaint  Patient presents with   Follow-up   Urinary Incontinence    HPI: ST: Carcinoma of the prostate with PSA of 317.  Transurethral resection of prostate April 2021 by Dr. Lovena Neighbours.  Has had a radiation.  Recent PSA 0.1.  Has failed Vesicare and Myrbetriq for mixed incontinence.  In May 2023 had a urethral stricture dilated.  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  He had radiation changes in his bladder.  A few years ago he was seeing Dr. Alinda Money.  He had positive lymph nodes.  It was recommended to have transurethral section of the prostate prior to planned radiation for his significant lower urinary tract symptoms.  It was felt that he had locally advanced disease causing some of the symptoms.  According to Dr. Lovena Neighbours he had bothersome urgency frequency and a poor flow in spite of Flomax prior to surgery or he had a channel TURP and circumcision.  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  Prior to dilation he had worsening lower urinary tract symptoms including urgency and incontinence.  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  I think he has some rigidity in the prostatic urethra.  He had radiation changes.  Currently leaks a small amount with coughing sneezing bending lifting.  He leaks more when he is active.  Primary problem is urge incontinence.  He has moderately severe bedwetting.  He can soak 3-4 pads a day.  He said he was continent after the transurethral resection of the prostate and only started to leak a few months prior to the urethral dilation.  I do not see any urodynamics done prior to the transurethral section of the prostate.  I did not see bladder capacity  measured under anesthesia  He voids every 1 hour.  No nocturia.  Flow was good.  Right-handed and has had a hernia repair.  He is an insulin-dependent diabetic  He had shoulder penis in a standing position I think he did have a positive cough test.  Male genitalia otherwise normal   PMH: Past Medical History:  Diagnosis Date   Alcohol use disorder, severe, dependence ([place])    05-24-2019 recent [facility] admission for suicide ideation and alcohol / cocaine detox 05-04-2019 in epic,  pt stated has been taking naltrexone daily as prescribed since and no alcohol since   Cocaine abuse ([place])    05-24-2019  pt had recent [facility] admission, pt stated last used  "crack" 05-03-2019   Cocaine abuse with cocaine-induced mood disorder ([place])    Depression    GERD (gastroesophageal reflux disease)    History of suicidal ideation    multiple [facility] admission's;  last one admitted 05-04-2019   Hyperplasia of prostate with lower urinary tract symptoms (LUTS)    Hypertension    followed by pcp   OSA on CPAP    uses intermittently   Phimosis    Prostate cancer [facility]) urologist-- dr winter   dx 02/ 2021;  localized advanced, gleason 4+4, PSA 317   Renal insufficiency    Sinus tachycardia    Thoracic aortic atherosclerosis ([place])    Type 2 diabetes mellitus treated with insulin ([place])    followed by pcp   (  05-24-2019  per stated checks blood sugar twice daily, fasting sugar-- 180-200)   Wears glasses     Surgical History: Past Surgical History:  Procedure Laterality Date   CIRCUMCISION N/A 05/29/2019   Procedure: CIRCUMCISION ADULT;  Surgeon: Ceasar Mons, MD;  Location: [facility];  Service: Urology;  Laterality: N/A;   CYSTOSCOPY WITH URETHRAL DILATATION N/A 06/08/2021   Procedure: CYSTOSCOPY WITH URETHRAL DILATATION;  Surgeon: Abbie Sons, MD;  Location: [facility];  Service: Urology;  Laterality: N/A;   PROSTATE BIOPSY     TONSILLECTOMY  child   TRANSURETHRAL RESECTION OF  PROSTATE N/A 05/29/2019   Procedure: TRANSURETHRAL RESECTION OF THE PROSTATE (TURP)/ CYSTOSCOPY;  Surgeon: Ceasar Mons, MD;  Location: [facility];  Service: Urology;  Laterality: N/A;    Home Medications:  Allergies as of 09/06/2021   No Known Allergies      Medication List        Accurate as of September 06, 2021 10:41 AM. If you have any questions, ask your nurse or doctor.          acetaminophen 325 MG tablet Commonly known as: TYLENOL Take 650 mg by mouth every 6 (six) hours as needed for moderate pain.   amLODipine 10 MG tablet Commonly known as: NORVASC Take 1 tablet (10 mg total) by mouth daily. For high blood pressure   calcium carbonate 500 MG chewable tablet Commonly known as: TUMS - dosed in mg elemental calcium Chew 1 tablet by mouth as needed for indigestion or heartburn.   cefUROXime 500 MG tablet Commonly known as: CEFTIN Take 1 tablet (500 mg total) by mouth 2 (two) times daily with a meal.   HumuLIN 70/30 KwikPen (70-30) 100 UNIT/ML KwikPen Generic drug: insulin isophane & regular human KwikPen 15 units in the morning and 20 units in the pm.   lisinopril 10 MG tablet Commonly known as: ZESTRIL Take 10 mg by mouth every morning.   rosuvastatin 20 MG tablet Commonly known as: CRESTOR Take 20 mg by mouth every morning.   solifenacin 10 MG tablet Commonly known as: VESICARE Take 1 tablet (10 mg total) by mouth daily.   traMADol 50 MG tablet Commonly known as: ULTRAM Take 1 tablet (50 mg total) by mouth every 6 (six) hours as needed for moderate pain.        Allergies: No Known Allergies  Family History: Family History  Problem Relation Age of Onset   Hypertension Mother    Hypertension Father    Diabetes Father    Prostate cancer Maternal Grandfather 47   Breast cancer Neg Hx    Colon cancer Neg Hx    Pancreatic cancer Neg Hx     Social History:  reports that he has never smoked. He has never been exposed to  tobacco smoke. He has never used smokeless tobacco. He reports that he does not currently use alcohol. He reports that he does not currently use drugs after having used the following drugs: Cocaine and Marijuana.  ROS:                                        Physical Exam: There were no vitals taken for this visit.  Constitutional:  Alert and oriented, No acute distress. HEENT: [place] AT, moist mucus membranes.  Trachea midline, no masses.   Laboratory Data: Lab Results  Component Value Date  WBC 4.2 06/04/2021   HGB 13.7 06/04/2021   HCT 42.5 06/04/2021   MCV 84.2 06/04/2021   PLT 371 06/04/2021    Lab Results  Component Value Date   CREATININE 1.00 05/11/2021    No results found for: "PSA"  Lab Results  Component Value Date   TESTOSTERONE 9 (L) 08/26/2019    Lab Results  Component Value Date   HGBA1C 11.4 (H) 05/04/2019    Urinalysis    Component Value Date/Time   COLORURINE STRAW (A) 12/24/2019 2240   APPEARANCEUR Clear 06/01/2021 1109   LABSPEC 1.018 12/24/2019 2240   PHURINE 7.0 12/24/2019 2240   GLUCOSEU 2+ (A) 06/01/2021 1109   HGBUR NEGATIVE 12/24/2019 2240   BILIRUBINUR Negative 06/01/2021 1109   KETONESUR NEGATIVE 12/24/2019 2240   PROTEINUR 2+ (A) 06/01/2021 1109   PROTEINUR NEGATIVE 12/24/2019 2240   UROBILINOGEN 0.2 12/17/2014 1649   NITRITE Negative 06/01/2021 1109   NITRITE NEGATIVE 12/24/2019 2240   LEUKOCYTESUR Negative 06/01/2021 1109   LEUKOCYTESUR NEGATIVE 12/24/2019 2240    Pertinent Imaging:   Assessment & Plan: Patient has mixed incontinence.  He has bedwetting.  On suspect that he has a small spastic bladder.  He likely may also have urethral insufficiency which is less common postradiation and transurethral section of the prostate.  He has had a stricture which would make it sphincter more difficult to place in the bulbar urethra.  He may need repeat cystoscopy in the future.  Call if culture positive.  Order  urodynamics and proceed accordingly.  He may need to be supine his bladder neck occluded with a small catheter to measure his bladder capacity if he starts leaking a lot at low volumes during urodynamics  There are no diagnoses linked to this encounter.  No follow-ups on file.  Reece Packer, MD  [place] [address], [place], [place] [address] [phone]

## 2021-09-09 ENCOUNTER — Telehealth: Payer: Self-pay

## 2021-09-09 MED ORDER — CEPHALEXIN 500 MG PO CAPS: 500.0000 mg | ORAL_CAPSULE | Freq: Three times a day (TID) | ORAL | 0 refills | Status: AC

## 2021-09-09 NOTE — Telephone Encounter (Signed)
Sw pt. Medication sent to [facility] ( [facility] in [place] per pt. Pt verbalized understanding.

## 2021-09-09 NOTE — Telephone Encounter (Signed)
-----   Message from Bjorn Loser, MD sent at 09/09/2021 12:48 PM EDT ----- Keflex 500 mg tid for 7 days ----- Message ----- From: Alvera Novel, CMA Sent: 09/08/2021   4:43 PM EDT To: Bjorn Loser, MD   ----- Message ----- From: Interface, [facility] Lab Results In Sent: 09/06/2021  11:36 AM EDT To: [facility]

## 2021-09-10 LAB — CULTURE, URINE COMPREHENSIVE

## 2021-09-15 ENCOUNTER — Telehealth: Payer: Self-pay | Admitting: Urology

## 2021-09-15 NOTE — Telephone Encounter (Signed)
Patient has not picked up his medication for the Trimix and he has an appt on 09/21/21. Can you look into this.   Sharyn Lull

## 2021-09-15 NOTE — Telephone Encounter (Signed)
Spoke to patient and he did get the medication. He forgot it was in the freezer. He will be here at his appointment time.

## 2021-09-21 ENCOUNTER — Encounter: Payer: Self-pay | Admitting: Urology

## 2021-09-21 ENCOUNTER — Ambulatory Visit (INDEPENDENT_AMBULATORY_CARE_PROVIDER_SITE_OTHER): Payer: Self-pay | Admitting: Urology

## 2021-09-21 VITALS — BP 136/90 | HR 81 | Ht 67.0 in | Wt 239.0 lb

## 2021-09-21 DIAGNOSIS — Z8546 Personal history of malignant neoplasm of prostate: Secondary | ICD-10-CM

## 2021-09-21 DIAGNOSIS — N3946 Mixed incontinence: Secondary | ICD-10-CM

## 2021-09-21 DIAGNOSIS — N529 Male erectile dysfunction, unspecified: Secondary | ICD-10-CM

## 2021-09-21 DIAGNOSIS — C61 Malignant neoplasm of prostate: Secondary | ICD-10-CM

## 2021-09-21 NOTE — Progress Notes (Signed)
09/21/2021 9:46 AM  Jonathan Austin Apr 01, 1965 [id number]   Referring provider: Collene Leyden, MD [address] [place],  [place] [address]  Urological history: 1. ED Contributing factors of age, BPH, diabetes, hypertension, depression, polysubstance abuse and hyperlipidemia failed PDE5i's   2.  Prostate cancer High risk prostate cancer diagnosed [facility] [place] 2021; PSA 317, 12/12 cores positive Gleason 4+4 adenocarcinoma with extensive extraprostatic extension and perineural invasion; CT suspicious for lymphadenopathy; no distant mets Significant outlet obstruction pretreatment and underwent channel TURP by Dr. Lovena Neighbours 05/2019 Completed IMRT 10/22/2019 Was on ADT however did not get his last injection due to loss of insurance PSA 04/16/2021 was 0.1   3.  Urinary incontinence Both stress and urge incontinence post radiation No improvement on Solifenacin, Myrbetriq Wears depends    4.  Bulbar/prostatic urethral stricture Dilation 06/08/2021  Chief Complaint  Patient presents with   Erectile Dysfunction    HPI: Jonathan Austin is a 56 y.o. male with PDE5i's refractory ED who presents today for a Trimix titration.  Patient is not having spontaneous erections.  He denies any pain or curvature with erections.  He did leak constantly during this appointment.  Physical Exam:  BP (!) 136/90   Pulse 81   Ht '5\' 7"'$  (1.702 m)   Wt 239 lb (108.4 kg)   BMI 37.43 kg/m   Constitutional:  Well nourished. Alert and oriented, No acute distress. GU: No CVA tenderness.  No bladder fullness or masses.  Patient with uncircumcised phallus. Foreskin easily retracted  Urethral meatus is patent.  No penile discharge. No penile lesions or rashes. Scrotum without lesions, cysts, rashes and/or edema.  Psychiatric: Normal mood and affect.   Procedure  Patient's left corpus cavernosum is identified.  An area near the base of the penis is cleansed with rubbing alcohol.  Careful to avoid the  dorsal vein, 2 mcg of Trimix (papaverine 30 mg, phentolamine 1 mg and prostaglandin E1 10 mcg, Lot # [id number]'@38'$  exp # [id number]$ is injected at a 90 degree angle into the left corpus cavernosum near the base of the penis.  Patient experienced penile fullness in 15 minutes.    Patient's right corpus cavernosum is identified.  An area near the base of the penis is cleansed with rubbing alcohol.  Careful to avoid the dorsal vein, 2 mcg of Trimix (papaverine 30 mg, phentolamine 1 mg and prostaglandin E1 10 mcg, Lot # [id number]'@38'$  exp # [id number] is injected at a 90 degree angle into the right corpus cavernosum near the base of the penis.  Patient experienced penile fullness in 15 minutes.    Patient's left corpus cavernosum is identified.  An area near the base of the penis is cleansed with rubbing alcohol.  Careful to avoid the dorsal vein, 2 mcg of Trimix (papaverine 30 mg, phentolamine 1 mg and prostaglandin E1 10 mcg, Lot # [id number]'@38'$  exp # [id number]$ is injected at a 90 degree angle into the left corpus cavernosum near the base of the penis.  Patient experienced penile fullness in 15 minutes.    Assessment & Plan:    1. ED Suboptimal response to 6 mcg of Trimix He will return in the next few weeks and we will start the titration at 6 mcg Advised patient of the condition of priapism, painful erection lasting for more than four hours, and to contact the office or seek treatment in the ED immediately  2. Prostate cancer Follow up scheduled for September for PSA/OV   3.  Mixed incontinence Followed by Dr. Matilde Sprang He states that he will be without insurance in the near future and will be unable to undergo UDS/cystoscopy at this time for further evaluation of his incontinence  Return for for repeat Trimix titration/PSA/OV .  [name]  [facility] [address] [place], [place] [address] [phone]  I spent 15 minutes on the day of the  encounter to include pre-visit record review, face-to-face time with the patient, and post-visit ordering of tests.

## 2021-10-05 NOTE — Progress Notes (Deleted)
10/05/2021 8:14 AM  Jonathan Austin 04/23/1965 [id number]   Referring provider: Collene Leyden, MD [address] [place] Jonathan Austin,  [place] [address]  Urological history: 1. ED Contributing factors of age, BPH, diabetes, hypertension, depression, polysubstance abuse and hyperlipidemia failed PDE5i's   2.  Prostate cancer PSA pending High risk prostate cancer diagnosed [facility] [place] 2021; PSA 317, 12/12 cores positive Gleason 4+4 adenocarcinoma with extensive extraprostatic extension and perineural invasion; CT suspicious for lymphadenopathy; no distant mets Significant outlet obstruction pretreatment and underwent channel TURP by Dr. Lovena Neighbours 05/2019 Completed IMRT 10/22/2019 Was on ADT however did not get his last injection due to loss of insurance   3.  Urinary incontinence Both stress and urge incontinence post radiation No improvement on Solifenacin, Myrbetriq Wears depends    4.  Bulbar/prostatic urethral stricture Dilation 06/08/2021  No chief complaint on file.  HPI: Jonathan Austin is a 56 y.o. male with PDE5i's refractory ED who presents today for a Trimix titration.  At his previous appointment on September 21, 2021, we performed titration with 6 mcg of Trimix.  He had a suboptimal response to this.  He also notified me that he will be without insurance in the near future and that will obviously cause some hardship for him.  He does have a history of prostate cancer and a PSA is drawn today.  Physical Exam:  There were no vitals taken for this visit.  Constitutional:  Well nourished. Alert and oriented, No acute distress. HEENT: [place] AT, moist mucus membranes.  Trachea midline Cardiovascular: No clubbing, cyanosis, or edema. Respiratory: Normal respiratory effort, no increased work of breathing. GU: No CVA tenderness.  No bladder fullness or masses.  Patient with circumcised/uncircumcised phallus. ***Foreskin easily retracted***  Urethral meatus is patent.  No penile  discharge. No penile lesions or rashes. Scrotum without lesions, cysts, rashes and/or edema.  Testicles are located scrotally bilaterally. No masses are appreciated in the testicles. Left and right epididymis are normal. Rectal: Patient with  normal sphincter tone. Anus and perineum without scarring or rashes. No rectal masses are appreciated. Prostate is approximately *** grams, *** nodules are appreciated. Seminal vesicles are normal. Neurologic: Grossly intact, no focal deficits, moving all 4 extremities. Psychiatric: Normal mood and affect.   Procedure  Patient's left corpus cavernosum is identified.  An area near the base of the penis is cleansed with rubbing alcohol.  Careful to avoid the dorsal vein, 2 mcg of Trimix (papaverine 30 mg, phentolamine 1 mg and prostaglandin E1 10 mcg, Lot # [id number]'@38'$  exp # [id number] is injected at a 90 degree angle into the left corpus cavernosum near the base of the penis.  Patient experienced penile fullness in 15 minutes.    Patient's right corpus cavernosum is identified.  An area near the base of the penis is cleansed with rubbing alcohol.  Careful to avoid the dorsal vein, 2 mcg of Trimix (papaverine 30 mg, phentolamine 1 mg and prostaglandin E1 10 mcg, Lot # [id number]'@38'$  exp # [id number]$ is injected at a 90 degree angle into the right corpus cavernosum near the base of the penis.  Patient experienced penile fullness in 15 minutes.    Patient's left corpus cavernosum is identified.  An area near the base of the penis is cleansed with rubbing alcohol.  Careful to avoid the dorsal vein, 2 mcg of Trimix (papaverine 30 mg, phentolamine 1 mg and prostaglandin E1 10 mcg, Lot # [id number]'@38'$  exp # [id number] is injected at a 90 degree  angle into the left corpus cavernosum near the base of the penis.  Patient experienced penile fullness in 15 minutes.    Assessment & Plan:    1. ED Suboptimal response to 6 mcg of Trimix He will return in the next few weeks and we will  start the titration at 6 mcg Advised patient of the condition of priapism, painful erection lasting for more than four hours, and to contact the office or seek treatment in the ED immediately  2. Prostate cancer PSA drawn today    3. Mixed incontinence Followed by Dr. Matilde Sprang He states that he will be without insurance in the near future and will be unable to undergo UDS/cystoscopy at this time for further evaluation of his incontinence  No follow-ups on file.  [facility] [address] [place], [place] [address] [phone]

## 2021-10-06 ENCOUNTER — Ambulatory Visit: Payer: Self-pay | Admitting: Urology

## 2021-10-06 DIAGNOSIS — N529 Male erectile dysfunction, unspecified: Secondary | ICD-10-CM

## 2021-10-06 DIAGNOSIS — C61 Malignant neoplasm of prostate: Secondary | ICD-10-CM

## 2021-10-07 ENCOUNTER — Ambulatory Visit: Payer: Self-pay | Admitting: Urology

## 2021-11-01 ENCOUNTER — Telehealth: Payer: Self-pay | Admitting: Urology

## 2021-11-01 NOTE — Telephone Encounter (Signed)
Left message for patient to call and schedule his follow up.

## 2021-11-01 NOTE — Telephone Encounter (Signed)
Pt returned your call, he is without insurance right now and in the process of getting Christella Scheuermann, he will call with his coverage is active. Pt doesn't want to have more bills without insurance.

## 2021-11-01 NOTE — Telephone Encounter (Signed)
Would you please call Jonathan Austin and have him schedule a follow up for his prostate cancer?  He is due for his PSA.

## 2021-11-08 ENCOUNTER — Ambulatory Visit: Payer: Self-pay | Admitting: Urology

## 2021-11-20 ENCOUNTER — Other Ambulatory Visit: Payer: Self-pay

## 2021-11-20 ENCOUNTER — Emergency Department (HOSPITAL_COMMUNITY)
Admission: EM | Admit: 2021-11-20 | Discharge: 2021-11-20 | Discharge status: Against Medical Advice | Payer: Commercial Managed Care - HMO | Attending: Emergency Medicine | Admitting: Emergency Medicine

## 2021-11-20 ENCOUNTER — Encounter (HOSPITAL_COMMUNITY): Payer: Self-pay

## 2021-11-20 DIAGNOSIS — N5082 Scrotal pain: Secondary | ICD-10-CM | POA: Insufficient documentation

## 2021-11-20 DIAGNOSIS — R200 Anesthesia of skin: Secondary | ICD-10-CM | POA: Diagnosis not present

## 2021-11-20 DIAGNOSIS — Z5321 Procedure and treatment not carried out due to patient leaving prior to being seen by health care provider: Secondary | ICD-10-CM | POA: Insufficient documentation

## 2021-11-20 DIAGNOSIS — R300 Dysuria: Secondary | ICD-10-CM | POA: Insufficient documentation

## 2021-11-20 DIAGNOSIS — Z8546 Personal history of malignant neoplasm of prostate: Secondary | ICD-10-CM | POA: Diagnosis not present

## 2021-11-20 DIAGNOSIS — R32 Unspecified urinary incontinence: Secondary | ICD-10-CM | POA: Insufficient documentation

## 2021-11-20 LAB — COMPREHENSIVE METABOLIC PANEL
ALT: 25 U/L (ref 0–44)
AST: 19 U/L (ref 15–41)
Albumin: 3.8 g/dL (ref 3.5–5.0)
Alkaline Phosphatase: 68 U/L (ref 38–126)
Anion gap: 6 (ref 5–15)
BUN: 9 mg/dL (ref 6–20)
CO2: 28 mmol/L (ref 22–32)
Calcium: 8.5 mg/dL — ABNORMAL LOW (ref 8.9–10.3)
Chloride: 101 mmol/L (ref 98–111)
Creatinine, Ser: 0.96 mg/dL (ref 0.61–1.24)
GFR, Estimated: >60 mL/min (ref >60)
Glucose, Bld: 241 mg/dL — ABNORMAL HIGH (ref 70–99)
Potassium: 3.9 mmol/L (ref 3.5–5.1)
Sodium: 135 mmol/L (ref 135–145)
Total Bilirubin: 0.7 mg/dL (ref 0.3–1.2)
Total Protein: 7.3 g/dL (ref 6.5–8.1)

## 2021-11-20 LAB — CBC WITH DIFFERENTIAL/PLATELET
Abs Immature Granulocytes: 0.03 10*3/uL (ref 0.00–0.07)
Basophils Absolute: 0 10*3/uL (ref 0.0–0.1)
Basophils Relative: 0 %
Eosinophils Absolute: 0 10*3/uL (ref 0.0–0.5)
Eosinophils Relative: 1 %
HCT: 42.4 % (ref 39.0–52.0)
Hemoglobin: 14 g/dL (ref 13.0–17.0)
Immature Granulocytes: 1 %
Lymphocytes Relative: 15 %
Lymphs Abs: 0.8 10*3/uL (ref 0.7–4.0)
MCH: 29 pg (ref 26.0–34.0)
MCHC: 33 g/dL (ref 30.0–36.0)
MCV: 87.8 fL (ref 80.0–100.0)
Monocytes Absolute: 0.5 10*3/uL (ref 0.1–1.0)
Monocytes Relative: 9 %
Neutro Abs: 3.9 10*3/uL (ref 1.7–7.7)
Neutrophils Relative %: 74 %
Platelets: 358 10*3/uL (ref 150–400)
RBC: 4.83 MIL/uL (ref 4.22–5.81)
RDW: 14.2 % (ref 11.5–15.5)
WBC: 5.3 10*3/uL (ref 4.0–10.5)
nRBC: 0 % (ref 0.0–0.2)

## 2021-11-20 NOTE — ED Triage Notes (Signed)
Patient said he has not been able to control his bladder for 6 months. Today he said it was the worst. Anytime he coughs, laughs, lifts objects he urinates and cannot control it.

## 2021-11-20 NOTE — ED Provider Triage Note (Signed)
Emergency Medicine Provider Triage Evaluation Note  Jonathan Austin , a 56 y.o. male  was evaluated in triage.  Pt complains of urinary incontinence, dysuria.  Patient states he has a history of prostate cancer and has been dealing with incontinence for greater than 1 year but he states that incontinence is gotten acutely worse over the past 1 to 2 days.  He states that any cough, laugh, lifting, bearing down produces urine.  He states he is to change his depends multiple times a day.  He states similar symptoms in the past when he had a urinary tract infection.  He called his urologist who sent him to the emergency department for further evaluation.  Denies fever, chills, night sweats, chest pain, shortness of breath, change in bowel habits.  Denies any recent falls or trauma to back, weakness in lower extremities, saddle anesthesia, bowel dysfunction, history of IV drug use, no malignancy.  He does report intermittent episodes of left lateral/anterior thigh numbness over the past year.  He also reports scrotal pain from irritation from urine.  Review of Systems  Positive: See above Negative:   Physical Exam  BP (!) 166/100 (BP Location: Left Arm)   Pulse (!) 104   Temp 97.7 F (36.5 C) (Oral)   Resp 15   Ht '5\' 7"'$  (1.702 m)   Wt 104.3 kg   SpO2 100%   BMI 36.02 kg/m  Gen:   Awake, no distress   Resp:  Normal effort  MSK:   Moves extremities without difficulty  Other:  No urine anterior abdominal tenderness.  Mild right-sided CVA tenderness.  Medical Decision Making  Medically screening exam initiated at 1:53 PM.  Appropriate orders placed.  Jonathan Austin was informed that the remainder of the evaluation will be completed by another provider, this initial triage assessment does not replace that evaluation, and the importance of remaining in the ED until their evaluation is complete.     Wilnette Kales, [place] 11/20/21 1357

## 2021-12-15 ENCOUNTER — Other Ambulatory Visit: Payer: Self-pay

## 2021-12-15 ENCOUNTER — Emergency Department (HOSPITAL_COMMUNITY)
Admission: EM | Admit: 2021-12-15 | Discharge: 2021-12-16 | Discharge status: Against Medical Advice | Payer: Commercial Managed Care - HMO | Attending: Emergency Medicine | Admitting: Emergency Medicine

## 2021-12-15 DIAGNOSIS — R000 Tachycardia, unspecified: Secondary | ICD-10-CM | POA: Insufficient documentation

## 2021-12-15 DIAGNOSIS — Z79899 Other long term (current) drug therapy: Secondary | ICD-10-CM | POA: Insufficient documentation

## 2021-12-15 DIAGNOSIS — Z794 Long term (current) use of insulin: Secondary | ICD-10-CM | POA: Insufficient documentation

## 2021-12-15 DIAGNOSIS — E1165 Type 2 diabetes mellitus with hyperglycemia: Secondary | ICD-10-CM | POA: Diagnosis not present

## 2021-12-15 DIAGNOSIS — I10 Essential (primary) hypertension: Secondary | ICD-10-CM | POA: Insufficient documentation

## 2021-12-15 DIAGNOSIS — R350 Frequency of micturition: Secondary | ICD-10-CM | POA: Insufficient documentation

## 2021-12-15 DIAGNOSIS — E860 Dehydration: Secondary | ICD-10-CM | POA: Insufficient documentation

## 2021-12-15 DIAGNOSIS — L02412 Cutaneous abscess of left axilla: Secondary | ICD-10-CM | POA: Insufficient documentation

## 2021-12-15 DIAGNOSIS — Z5321 Procedure and treatment not carried out due to patient leaving prior to being seen by health care provider: Secondary | ICD-10-CM | POA: Diagnosis not present

## 2021-12-15 DIAGNOSIS — L02414 Cutaneous abscess of left upper limb: Secondary | ICD-10-CM | POA: Insufficient documentation

## 2021-12-15 DIAGNOSIS — E119 Type 2 diabetes mellitus without complications: Secondary | ICD-10-CM | POA: Diagnosis not present

## 2021-12-15 LAB — URINALYSIS, ROUTINE W REFLEX MICROSCOPIC
Bacteria, UA: NONE SEEN
Bilirubin Urine: NEGATIVE
Glucose, UA: >=500 mg/dL — AB
Hgb urine dipstick: NEGATIVE
Ketones, ur: NEGATIVE mg/dL
Leukocytes,Ua: NEGATIVE
Nitrite: NEGATIVE
Protein, ur: NEGATIVE mg/dL
Specific Gravity, Urine: 1.02 (ref 1.005–1.030)
pH: 7 (ref 5.0–8.0)

## 2021-12-15 NOTE — ED Notes (Signed)
Called pt's name twice and no answer

## 2021-12-15 NOTE — ED Triage Notes (Signed)
Pt. Stated, My bladder is a problem Im unable to control my urine , it drips and drips and Im using a lot of depends all the time. For about a year. I also have a boil underneath my left arm for about 2 weeks.

## 2021-12-16 ENCOUNTER — Encounter (HOSPITAL_COMMUNITY): Payer: Self-pay | Admitting: *Deleted

## 2021-12-16 ENCOUNTER — Emergency Department (EMERGENCY_DEPARTMENT_HOSPITAL)
Admission: EM | Admit: 2021-12-16 | Discharge: 2021-12-16 | Disposition: A | Discharge status: Home / Self Care | Payer: Commercial Managed Care - HMO | Source: Home / Self Care | Attending: Emergency Medicine | Admitting: Emergency Medicine

## 2021-12-16 ENCOUNTER — Other Ambulatory Visit: Payer: Self-pay

## 2021-12-16 DIAGNOSIS — R000 Tachycardia, unspecified: Secondary | ICD-10-CM | POA: Insufficient documentation

## 2021-12-16 DIAGNOSIS — L02412 Cutaneous abscess of left axilla: Secondary | ICD-10-CM | POA: Insufficient documentation

## 2021-12-16 DIAGNOSIS — I10 Essential (primary) hypertension: Secondary | ICD-10-CM | POA: Insufficient documentation

## 2021-12-16 DIAGNOSIS — R739 Hyperglycemia, unspecified: Secondary | ICD-10-CM

## 2021-12-16 DIAGNOSIS — E119 Type 2 diabetes mellitus without complications: Secondary | ICD-10-CM

## 2021-12-16 DIAGNOSIS — Z79899 Other long term (current) drug therapy: Secondary | ICD-10-CM | POA: Insufficient documentation

## 2021-12-16 DIAGNOSIS — E1165 Type 2 diabetes mellitus with hyperglycemia: Secondary | ICD-10-CM | POA: Insufficient documentation

## 2021-12-16 DIAGNOSIS — E860 Dehydration: Secondary | ICD-10-CM | POA: Insufficient documentation

## 2021-12-16 DIAGNOSIS — Z794 Long term (current) use of insulin: Secondary | ICD-10-CM | POA: Insufficient documentation

## 2021-12-16 LAB — URINALYSIS, ROUTINE W REFLEX MICROSCOPIC
Bacteria, UA: NONE SEEN
Bilirubin Urine: NEGATIVE
Glucose, UA: >=500 mg/dL — AB
Hgb urine dipstick: NEGATIVE
Ketones, ur: NEGATIVE mg/dL
Leukocytes,Ua: NEGATIVE
Nitrite: NEGATIVE
Protein, ur: NEGATIVE mg/dL
Specific Gravity, Urine: 1.023 (ref 1.005–1.030)
pH: 7 (ref 5.0–8.0)

## 2021-12-16 LAB — LACTIC ACID, PLASMA: Lactic Acid, Venous: 3.3 mmol/L (ref 0.5–1.9)

## 2021-12-16 LAB — BASIC METABOLIC PANEL
Anion gap: 11 (ref 5–15)
Anion gap: 9 (ref 5–15)
BUN: 20 mg/dL (ref 6–20)
BUN: 15 mg/dL (ref 6–20)
CO2: 29 mmol/L (ref 22–32)
CO2: 27 mmol/L (ref 22–32)
Calcium: 8.8 mg/dL — ABNORMAL LOW (ref 8.9–10.3)
Calcium: 8.8 mg/dL — ABNORMAL LOW (ref 8.9–10.3)
Chloride: 86 mmol/L — ABNORMAL LOW (ref 98–111)
Chloride: 99 mmol/L (ref 98–111)
Creatinine, Ser: 1.38 mg/dL — ABNORMAL HIGH (ref 0.61–1.24)
Creatinine, Ser: 0.93 mg/dL (ref 0.61–1.24)
GFR, Estimated: >60 mL/min (ref >60)
GFR, Estimated: >60 mL/min (ref >60)
Glucose, Bld: 927 mg/dL (ref 70–99)
Glucose, Bld: 351 mg/dL — ABNORMAL HIGH (ref 70–99)
Potassium: 5.2 mmol/L — ABNORMAL HIGH (ref 3.5–5.1)
Potassium: 4 mmol/L (ref 3.5–5.1)
Sodium: 126 mmol/L — ABNORMAL LOW (ref 135–145)
Sodium: 135 mmol/L (ref 135–145)

## 2021-12-16 LAB — RAPID URINE DRUG SCREEN, HOSP PERFORMED
Amphetamines: NOT DETECTED
Barbiturates: NOT DETECTED
Benzodiazepines: NOT DETECTED
Cocaine: POSITIVE — AB
Opiates: NOT DETECTED
Tetrahydrocannabinol: NOT DETECTED

## 2021-12-16 LAB — OSMOLALITY: Osmolality: 329 mOsm/kg (ref 275–295)

## 2021-12-16 LAB — BETA-HYDROXYBUTYRIC ACID: Beta-Hydroxybutyric Acid: 0.16 mmol/L (ref 0.05–0.27)

## 2021-12-16 LAB — CBG MONITORING, ED
Glucose-Capillary: >600 mg/dL (ref 70–99)
Glucose-Capillary: >600 mg/dL (ref 70–99)
Glucose-Capillary: >600 mg/dL (ref 70–99)
Glucose-Capillary: 491 mg/dL — ABNORMAL HIGH (ref 70–99)
Glucose-Capillary: >600 mg/dL (ref 70–99)
Glucose-Capillary: 347 mg/dL — ABNORMAL HIGH (ref 70–99)

## 2021-12-16 LAB — CBC
HCT: 47.2 % (ref 39.0–52.0)
Hemoglobin: 15 g/dL (ref 13.0–17.0)
MCH: 28.9 pg (ref 26.0–34.0)
MCHC: 31.8 g/dL (ref 30.0–36.0)
MCV: 90.9 fL (ref 80.0–100.0)
Platelets: 337 10*3/uL (ref 150–400)
RBC: 5.19 MIL/uL (ref 4.22–5.81)
RDW: 13.6 % (ref 11.5–15.5)
WBC: 6.1 10*3/uL (ref 4.0–10.5)
nRBC: 0 % (ref 0.0–0.2)

## 2021-12-16 MED ORDER — DEXTROSE 50 % IV SOLN: 0.0000 mL | INTRAVENOUS | Status: DC | PRN

## 2021-12-16 MED ORDER — INSULIN ASPART PROT & ASPART (70-30 MIX) 100 UNIT/ML ~~LOC~~ SUSP
17.0000 [IU] | Freq: Once | SUBCUTANEOUS | Status: AC
  Administered 2021-12-16: 17 [IU] via SUBCUTANEOUS
  Filled 2021-12-16: qty 10

## 2021-12-16 MED ORDER — INSULIN REGULAR(HUMAN) IN NACL 100-0.9 UT/100ML-% IV SOLN
INTRAVENOUS | Status: DC
  Administered 2021-12-16: 15 [IU]/h via INTRAVENOUS
  Filled 2021-12-16 (×3): qty 100

## 2021-12-16 MED ORDER — DOXYCYCLINE HYCLATE 100 MG PO CAPS: 100.0000 mg | ORAL_CAPSULE | Freq: Two times a day (BID) | ORAL | 0 refills | Status: DC

## 2021-12-16 MED ORDER — DEXTROSE IN LACTATED RINGERS 5 % IV SOLN: INTRAVENOUS | Status: DC

## 2021-12-16 MED ORDER — LACTATED RINGERS IV SOLN: INTRAVENOUS | Status: DC

## 2021-12-16 MED ORDER — AMLODIPINE BESYLATE 5 MG PO TABS
10.0000 mg | ORAL_TABLET | Freq: Once | ORAL | Status: AC
  Administered 2021-12-16: 10 mg via ORAL
  Filled 2021-12-16: qty 2

## 2021-12-16 MED ORDER — VANCOMYCIN HCL 2000 MG/400ML IV SOLN
2000.0000 mg | Freq: Once | INTRAVENOUS | Status: AC
  Administered 2021-12-16: 2000 mg via INTRAVENOUS
  Filled 2021-12-16: qty 400

## 2021-12-16 MED ORDER — LACTATED RINGERS IV BOLUS
20.0000 mL/kg | Freq: Once | INTRAVENOUS | Status: AC
  Administered 2021-12-16: 2086 mL via INTRAVENOUS

## 2021-12-16 MED ORDER — SODIUM CHLORIDE 0.9 % IV BOLUS
1000.0000 mL | Freq: Once | INTRAVENOUS | Status: AC
  Administered 2021-12-16: 1000 mL via INTRAVENOUS

## 2021-12-16 MED ORDER — LISINOPRIL 10 MG PO TABS
10.0000 mg | ORAL_TABLET | Freq: Once | ORAL | Status: AC
  Administered 2021-12-16: 10 mg via ORAL
  Filled 2021-12-16: qty 1

## 2021-12-16 NOTE — ED Triage Notes (Signed)
Pt says that he has not been able to control his urine, he is having to change his depends frequently. He is supposed to be taking insulin, but has been out for several days. His sugar has been in the 300's. Also, has not been taking his bp meds for 7-8 months. Glucose reading "high in triage".

## 2021-12-16 NOTE — ED Provider Notes (Signed)
The plan was for patient to be admitted given his hyperglycemia and draining abscesses.  Dr. Marylyn Ishihara with the hospitalist service went to see the patient for admission and patient reports he wants to go home.  Patient has now been on an insulin drip since 3 AM and last blood sugar was 345.  He understands that he could get worse, infection can get worse and he might come back to the emergency room but he states that his brother has a job for him and he really needs to leave so he can make some money.  The reason why his blood sugar was elevated is because he ran out of insulin and could not afford to go to [place] and buy a new bottle because the [employer] laid him off.  He was 4 days without his insulin.  Offered TOC to try to obtain the medications for him but he reports he really wants to leave now.  Patient was given subcu insulin here 17 units which is what he typically takes in the morning.  He was given return precautions and a prescription for doxycycline.   Blanchie Dessert, MD 12/16/21 [phone]

## 2021-12-16 NOTE — ED Provider Notes (Signed)
[facility] DEPT Provider Note   CSN: [id number] Arrival date & time: 12/16/21  0236     History  Chief Complaint  Patient presents with   Hyperglycemia    [name] is a 56 y.o. male.  Patient presents to the emergency department for evaluation of increased urination.  Patient reports that he ran out of his insulin several days ago and has noticed that he has been urinating constantly.  Patient reports that his blood sugars have been around 300.  Additionally, patient reports that he has had a wound in his left axilla that has been present for more than a week.  The area has been draining pus.       Home Medications Prior to Admission medications   Medication Sig Start Date End Date Taking? Authorizing Provider  acetaminophen (TYLENOL) 325 MG tablet Take 650 mg by mouth every 6 (six) hours as needed for moderate pain.    [provider]  AMBULATORY NON FORMULARY MEDICATION Trimix (30/1/10)-(Pap/Phent/PGE)  Test Dose  29m vial   Qty [place]3 [phone] [phone]/31/23   MZara CouncilA, PA-C  amLODipine (NORVASC) 10 MG tablet Take 1 tablet (10 mg total) by mouth daily. For high blood pressure 01/27/18   NLindell SparI, NP  calcium carbonate (TUMS - DOSED IN MG ELEMENTAL CALCIUM) 500 MG chewable tablet Chew 1 tablet by mouth as needed for indigestion or heartburn.    [provider]  insulin isophane & regular human KwikPen (HUMULIN 70/30 KWIKPEN) (70-30) 100 UNIT/ML KwikPen 15 units in the morning and 20 units in the pm.    [provider]  lisinopril (ZESTRIL) 10 MG tablet Take 10 mg by mouth every morning. 04/26/21   [provider]  rosuvastatin (CRESTOR) 20 MG tablet Take 20 mg by mouth every morning. 04/01/21   [provider]  solifenacin (VESICARE) 10 MG tablet Take 1 tablet (10 mg total) by mouth daily. 05/13/21   Stoioff, SRonda Fairly MD  traMADol  (ULTRAM) 50 MG tablet Take 1 tablet (50 mg total) by mouth every 6 (six) hours as needed for moderate pain. 06/08/21   Stoioff, SRonda Fairly MD      Allergies    Patient has no known allergies.    Review of Systems   Review of Systems  Physical Exam Updated Vital Signs BP (!) 164/96   Pulse (!) 115   Temp 97.6 F (36.4 C) (Oral)   Resp 16   SpO2 99%  Physical Exam Vitals and nursing note reviewed.  Constitutional:      General: He is not in acute distress.    Appearance: He is well-developed.  HENT:     Head: Normocephalic and atraumatic.     Mouth/Throat:     Mouth: Mucous membranes are moist.  Eyes:     General: Vision grossly intact. Gaze aligned appropriately.     Extraocular Movements: Extraocular movements intact.     Conjunctiva/sclera: Conjunctivae normal.  Cardiovascular:     Rate and Rhythm: Regular rhythm. Tachycardia present.     Pulses: Normal pulses.     Heart sounds: Normal heart sounds, S1 normal and S2 normal. No murmur heard.    No friction rub. No gallop.  Pulmonary:     Effort: Pulmonary effort is normal. No respiratory distress.     Breath sounds: Normal breath sounds.  Abdominal:     Palpations: Abdomen is soft.     Tenderness:  There is no abdominal tenderness. There is no guarding or rebound.     Hernia: No hernia is present.  Musculoskeletal:        General: No swelling.     Cervical back: Full passive range of motion without pain, normal range of motion and neck supple. No pain with movement, spinous process tenderness or muscular tenderness. Normal range of motion.     Right lower leg: No edema.     Left lower leg: No edema.  Skin:    General: Skin is warm and dry.     Capillary Refill: Capillary refill takes less than 2 seconds.     Findings: Wound (open, draining abscess left axilla) present. No ecchymosis, erythema or lesion.  Neurological:     Mental Status: He is alert and oriented to person, place, and time.     GCS: GCS eye subscore is  4. GCS verbal subscore is 5. GCS motor subscore is 6.     Cranial Nerves: Cranial nerves 2-12 are intact.     Sensory: Sensation is intact.     Motor: Motor function is intact. No weakness or abnormal muscle tone.     Coordination: Coordination is intact.  Psychiatric:        Mood and Affect: Mood normal.        Speech: Speech normal.        Behavior: Behavior normal.     ED Results / Procedures / Treatments   Labs (all labs ordered are listed, but only abnormal results are displayed) Labs Reviewed  BASIC METABOLIC PANEL - Abnormal; Notable for the following components:      Result Value   Sodium 126 (*)    Potassium 5.2 (*)    Chloride 86 (*)    Glucose, Bld 927 (*)    Creatinine, Ser 1.38 (*)    Calcium 8.8 (*)    All other components within normal limits  URINALYSIS, ROUTINE W REFLEX MICROSCOPIC - Abnormal; Notable for the following components:   Color, Urine COLORLESS (*)    Glucose, UA >=500 (*)    All other components within normal limits  CBG MONITORING, ED - Abnormal; Notable for the following components:   Glucose-Capillary >600 (*)    All other components within normal limits  CULTURE, BLOOD (ROUTINE X 2)  CULTURE, BLOOD (ROUTINE X 2)  CBC  OSMOLALITY  BETA-HYDROXYBUTYRIC ACID  BETA-HYDROXYBUTYRIC ACID  BETA-HYDROXYBUTYRIC ACID  RAPID URINE DRUG SCREEN, HOSP PERFORMED  LACTIC ACID, PLASMA  CBG MONITORING, ED    EKG EKG Interpretation  Date/Time:  Thursday December 16 2021 04:03:06 EST Ventricular Rate:  116 PR Interval:  157 QRS Duration: 91 QT Interval:  328 QTC Calculation: 456 R Axis:   61 Text Interpretation: Sinus tachycardia Biatrial enlargement Confirmed by Orpah Greek [phone]) on 12/16/2021 4:22:47 AM  Radiology No results found.  Procedures Procedures    Medications Ordered in ED Medications  lactated ringers bolus 2,086 mL (has no administration in time range)  insulin regular, human (MYXREDLIN) 100 units/ 100 mL infusion  (has no administration in time range)  lactated ringers infusion (has no administration in time range)  dextrose 5 % in lactated ringers infusion (has no administration in time range)  dextrose 50 % solution 0-50 mL (has no administration in time range)  amLODipine (NORVASC) tablet 10 mg (has no administration in time range)  lisinopril (ZESTRIL) tablet 10 mg (has no administration in time range)  sodium chloride 0.9 % bolus 1,000 mL (0 mLs  Intravenous Stopped 12/16/21 0517)    ED Course/ Medical Decision Making/ A&P                           Medical Decision Making Amount and/or Complexity of Data Reviewed External Data Reviewed: labs, ECG and notes. Labs: ordered. Decision-making details documented in ED Course. Radiology: ordered and independent interpretation performed. Decision-making details documented in ED Course. ECG/medicine tests: ordered and independent interpretation performed. Decision-making details documented in ED Course.  Risk Prescription drug management.   Patient presents to the emergency department for evaluation of polyuria in the setting of hyperglycemia.  Patient is a type II and some dependent diabetic and has been out of his insulin for days.  Differential diagnosis includes hyperglycemia, diabetic ketoacidosis, hyperosmolar nonketotic state.  Additional diagnoses considered includes cellulitis, soft tissue abscess.   Patient has been experiencing drainage from his left axilla for up to 2 weeks.  He does have evidence of a draining abscess with very slight area of cellulitis.  No fluctuance to suggest need for further drainage.  Glucose found to be 927.  Anion gap.  Serum osmolality pending.  Patient tachycardic consistent with dehydration.  Initiate aggressive fluid hydration and insulin drip to correct significant hyperglycemia.  Patient's uncontrolled blood sugars are multifactorial.  He does have a soft tissue infection which has been driving up his sugars  and now he is off of his insulin.  Patient also has been out of his blood pressure medications for months.  Blood pressure significantly elevated on arrival.  Patient administered his prior doses of outpatient blood pressure meds.  Patient does have cellulitis associated with draining left axillary abscess.  Given dose of vancomycin.  He has multiple medical problems and will benefit from hospitalization to initiate treatment of infection and reinitiate treatment of his diabetes and hypertension.  CRITICAL CARE Performed by: Orpah Greek   Total critical care time: 30 minutes  Critical care time was exclusive of separately billable procedures and treating other patients.  Critical care was necessary to treat or prevent imminent or life-threatening deterioration.  Critical care was time spent personally by me on the following activities: development of treatment plan with patient and/or surrogate as well as nursing, discussions with consultants, evaluation of patient's response to treatment, examination of patient, obtaining history from patient or surrogate, ordering and performing treatments and interventions, ordering and review of laboratory studies, ordering and review of radiographic studies, pulse oximetry and re-evaluation of patient's condition.         Final Clinical Impression(s) / ED Diagnoses Final diagnoses:  Hyperglycemia  Abscess of axilla, left    Rx / DC Orders ED Discharge Orders     None         Govanni Plemons, Gwenyth Allegra, MD 12/16/21 [phone]

## 2021-12-16 NOTE — Progress Notes (Signed)
A consult was received from an ED physician for Vancomycin per pharmacy dosing.  The patient's profile has been reviewed for ht/wt/allergies/indication/available labs.    A one time order has been placed for Vancomycin 2gm IV.    Further antibiotics/pharmacy consults should be ordered by admitting physician if indicated.                       Thank you, Everette Rank, PharmD 12/16/2021  5:22 AM

## 2021-12-16 NOTE — ED Notes (Signed)
X2 no response. Patient called by this Probation officer. Patient states "I am on the way to [place]. I'm just trying to get seen tonight." Patient states he is going to be seen at the other hospital.

## 2021-12-16 NOTE — ED Notes (Signed)
Pt asked to be discharged. Medications stopped. Will d/w MD if this will be an AMA.

## 2021-12-16 NOTE — Consult Note (Addendum)
Initial Consultation Note   Patient: Jonathan Austin TKZ:[id number] DOB: 1965-11-22 PCP: Collene Leyden, MD DOA: 12/16/2021 DOS: the patient was seen and examined on 12/16/2021 Primary service: Orpah Greek, *  Referring physician: Dr. Betsey Holiday Reason for consult: Hyperglycemia  Assessment and Plan: Uncontrolled DM2, hyperglycemia     - presenting with glucose 900+; but not in DKA     - he was started on Endotool for hyperglycemia and his glucose responded; it's now down to 335     - he is refusing admission however; says he's needs to go home and pay bills; I recommended that he stay to finish treatment, but he continues to refuse     - recommend quick follow up with PCP  Left axillary abscess     - spontaneously draining; started on vanc; can continue doxy or clinda at discharge  EtOH abuse     - counsel against further use  Marijuana abuse     - counsel against further use  Cocaine abuse     - counsel against further  HTN     - continue home regimen; follow up with PCP  TRH will sign off at present, please call us again when needed.  HPI: Jonathan Austin is a 56 y.o. male with past medical history of HTN, GERD, EtOH abuse, DM2. Presenting with hyperglycemia and abscess draining. He reports that he's had 2 axillary boils start draining over the last few days. He's dressed them with band aids, but otherwise has not tried any medications. He has not had any fevers, N/V. He went to the ED yesterday to be assess, but left d/t a long wait. Last night he noticed that he was urinating a lot. He reports that he has been out of his insulin. So he decided to come back to the ED for evaluation.    Review of Systems: As mentioned in the history of present illness. All other systems reviewed and are negative. Past Medical History:  Diagnosis Date   Alcohol use disorder, severe, dependence ([place])    05-24-2019 recent [facility] admission for suicide ideation and alcohol / cocaine detox  05-04-2019 in epic,  pt stated has been taking naltrexone daily as prescribed since and no alcohol since   Cocaine abuse ([place])    05-24-2019  pt had recent [facility] admission, pt stated last used  "crack" 05-03-2019   Cocaine abuse with cocaine-induced mood disorder ([place])    Depression    GERD (gastroesophageal reflux disease)    History of suicidal ideation    multiple [facility] admission's;  last one admitted 05-04-2019   Hyperplasia of prostate with lower urinary tract symptoms (LUTS)    Hypertension    followed by pcp   OSA on CPAP    uses intermittently   Phimosis    Prostate cancer [facility]) urologist-- dr winter   dx 02/ 2021;  localized advanced, gleason 4+4, PSA 317   Renal insufficiency    Sinus tachycardia    Thoracic aortic atherosclerosis ([place])    Type 2 diabetes mellitus treated with insulin ([place])    followed by pcp   (05-24-2019  per stated checks blood sugar twice daily, fasting sugar-- 180-200)   Wears glasses    Past Surgical History:  Procedure Laterality Date   CIRCUMCISION N/A 05/29/2019   Procedure: CIRCUMCISION ADULT;  Surgeon: Ceasar Mons, MD;  Location: [facility];  Service: Urology;  Laterality: N/A;   CYSTOSCOPY WITH URETHRAL DILATATION N/A 06/08/2021   Procedure:  CYSTOSCOPY WITH URETHRAL DILATATION;  Surgeon: Abbie Sons, MD;  Location: [facility];  Service: Urology;  Laterality: N/A;   PROSTATE BIOPSY     TONSILLECTOMY  child   TRANSURETHRAL RESECTION OF PROSTATE N/A 05/29/2019   Procedure: TRANSURETHRAL RESECTION OF THE PROSTATE (TURP)/ CYSTOSCOPY;  Surgeon: Ceasar Mons, MD;  Location: [facility];  Service: Urology;  Laterality: N/A;   Social History:  reports that he has never smoked. He has never been exposed to tobacco smoke. He has never used smokeless tobacco. He reports that he does not currently use alcohol. He reports that he does not currently use drugs after having used the following drugs: Cocaine  and Marijuana.  No Known Allergies  Family History  Problem Relation Age of Onset   Hypertension Mother    Hypertension Father    Diabetes Father    Prostate cancer Maternal Grandfather 96   Breast cancer Neg Hx    Colon cancer Neg Hx    Pancreatic cancer Neg Hx     Prior to Admission medications   Medication Sig Start Date End Date Taking? Authorizing Provider  doxycycline (VIBRAMYCIN) 100 MG capsule Take 1 capsule (100 mg total) by mouth 2 (two) times daily. 12/16/21  Yes Blanchie Dessert, MD  acetaminophen (TYLENOL) 325 MG tablet Take 650 mg by mouth every 6 (six) hours as needed for moderate pain.    [provider]  AMBULATORY NON FORMULARY MEDICATION Trimix (30/1/10)-(Pap/Phent/PGE)  Test Dose  52m vial   Qty #3 [phone] [phone]/31/23   MZara CouncilA, PA-C  amLODipine (NORVASC) 10 MG tablet Take 1 tablet (10 mg total) by mouth daily. For high blood pressure 01/27/18   NLindell SparI, NP  calcium carbonate (TUMS - DOSED IN MG ELEMENTAL CALCIUM) 500 MG chewable tablet Chew 1 tablet by mouth as needed for indigestion or heartburn.    [provider]  insulin isophane & regular human KwikPen (HUMULIN 70/30 KWIKPEN) (70-30) 100 UNIT/ML KwikPen 15 units in the morning and 20 units in the pm.    [provider]  lisinopril (ZESTRIL) 10 MG tablet Take 10 mg by mouth every morning. 04/26/21   [provider]  rosuvastatin (CRESTOR) 20 MG tablet Take 20 mg by mouth every morning. 04/01/21   [provider]  solifenacin (VESICARE) 10 MG tablet Take 1 tablet (10 mg total) by mouth daily. 05/13/21   Stoioff, SRonda Fairly MD  traMADol (ULTRAM) 50 MG tablet Take 1 tablet (50 mg total) by mouth every 6 (six) hours as needed for moderate pain. 06/08/21   SAbbie Sons MD    Physical Exam: Vitals:   12/16/21 0713 12/16/21 0751 12/16/21 0830 12/16/21 0900  BP: (!) 152/87 (!) 138/93 (!) 133/94 (!)  124/96  Pulse: (!) 102 (!) 107 (!) 107 95  Resp: '18 14 20 15  '$ Temp: (!) 97.5 F (36.4 C)     TempSrc: Oral     SpO2: 99% 100% 98% 97%   General: 56y.o. male resting in bed in NAD Eyes: PERRL, normal sclera ENMT: Nares patent w/o discharge, orophaynx clear, dentition normal, ears w/o discharge/lesions/ulcers Neck: Supple, trachea midline Cardiovascular: RRR, +S1, S2, no m/g/r, equal pulses throughout Respiratory: CTABL, no w/r/r, normal WOB GI: BS+, NDNT, no masses noted, no organomegaly noted MSK: No e/c/c; two spontaneously draining abscess non-endurated Neuro: A&O x 3, no focal deficits Psyc: Appropriate interaction and affect, calm/cooperative  Data Reviewed:  Results for orders placed or performed during the hospital encounter of 12/16/21 (from the past 24 hour(s))  CBG monitoring, ED     Status: Abnormal   Collection Time: 12/16/21  2:57 AM  Result Value Ref Range   Glucose-Capillary >600 (HH) 70 - 99 mg/dL  Basic metabolic panel     Status: Abnormal   Collection Time: 12/16/21  3:10 AM  Result Value Ref Range   Sodium 126 (L) 135 - 145 mmol/L   Potassium 5.2 (H) 3.5 - 5.1 mmol/L   Chloride 86 (L) 98 - 111 mmol/L   CO2 29 22 - 32 mmol/L   Glucose, Bld 927 (HH) 70 - 99 mg/dL   BUN 20 6 - 20 mg/dL   Creatinine, Ser 1.38 (H) 0.61 - 1.24 mg/dL   Calcium 8.8 (L) 8.9 - 10.3 mg/dL   GFR, Estimated >60 >60 mL/min   Anion gap 11 5 - 15  CBC     Status: None   Collection Time: 12/16/21  3:10 AM  Result Value Ref Range   WBC 6.1 4.0 - 10.5 K/uL   RBC 5.19 4.22 - 5.81 MIL/uL   Hemoglobin 15.0 13.0 - 17.0 g/dL   HCT 47.2 39.0 - 52.0 %   MCV 90.9 80.0 - 100.0 fL   MCH 28.9 26.0 - 34.0 pg   MCHC 31.8 30.0 - 36.0 g/dL   RDW 13.6 11.5 - 15.5 %   Platelets 337 150 - 400 K/uL   nRBC 0.0 0.0 - 0.2 %  Urinalysis, Routine w reflex microscopic Urine, Clean Catch     Status: Abnormal   Collection Time: 12/16/21  3:12 AM  Result Value Ref Range   Color, Urine COLORLESS (A) YELLOW    APPearance CLEAR CLEAR   Specific Gravity, Urine 1.023 1.005 - 1.030   pH 7.0 5.0 - 8.0   Glucose, UA >=500 (A) NEGATIVE mg/dL   Hgb urine dipstick NEGATIVE NEGATIVE   Bilirubin Urine NEGATIVE NEGATIVE   Ketones, ur NEGATIVE NEGATIVE mg/dL   Protein, ur NEGATIVE NEGATIVE mg/dL   Nitrite NEGATIVE NEGATIVE   Leukocytes,Ua NEGATIVE NEGATIVE   RBC / HPF 0-5 0 - 5 RBC/hpf   WBC, UA 0-5 0 - 5 WBC/hpf   Bacteria, UA NONE SEEN NONE SEEN   Squamous Epithelial / LPF 0-5 0 - 5  CBG monitoring, ED     Status: Abnormal   Collection Time: 12/16/21  5:24 AM  Result Value Ref Range   Glucose-Capillary >600 (HH) 70 - 99 mg/dL   Comment 1 Notify RN    Comment 2 Document in Chart   Beta-hydroxybutyric acid     Status: None   Collection Time: 12/16/21  5:30 AM  Result Value Ref Range   Beta-Hydroxybutyric Acid 0.16 0.05 - 0.27 mmol/L  Rapid urine drug screen (hospital performed)     Status: Abnormal   Collection Time: 12/16/21  5:30 AM  Result Value Ref Range   Opiates NONE DETECTED NONE DETECTED   Cocaine POSITIVE (A) NONE DETECTED   Benzodiazepines NONE DETECTED NONE DETECTED   Amphetamines NONE DETECTED NONE DETECTED   Tetrahydrocannabinol NONE DETECTED NONE DETECTED   Barbiturates NONE DETECTED NONE DETECTED  Lactic acid, plasma     Status: Abnormal   Collection Time: 12/16/21  5:30 AM  Result Value Ref Range   Lactic Acid, Venous 3.3 (HH) 0.5 - 1.9 mmol/L  CBG monitoring, ED     Status: Abnormal   Collection Time: 12/16/21  6:10 AM  Result Value  Ref Range   Glucose-Capillary >600 (HH) 70 - 99 mg/dL   Comment 1 Notify RN    Comment 2 Document in Chart   CBG monitoring, ED     Status: Abnormal   Collection Time: 12/16/21  6:46 AM  Result Value Ref Range   Glucose-Capillary >600 (HH) 70 - 99 mg/dL  CBG monitoring, ED     Status: Abnormal   Collection Time: 12/16/21  7:18 AM  Result Value Ref Range   Glucose-Capillary 491 (H) 70 - 99 mg/dL  CBG monitoring, ED     Status: Abnormal    Collection Time: 12/16/21  7:53 AM  Result Value Ref Range   Glucose-Capillary 347 (H) 70 - 99 mg/dL  Basic metabolic panel     Status: Abnormal   Collection Time: 12/16/21  8:05 AM  Result Value Ref Range   Sodium 135 135 - 145 mmol/L   Potassium 4.0 3.5 - 5.1 mmol/L   Chloride 99 98 - 111 mmol/L   CO2 27 22 - 32 mmol/L   Glucose, Bld 351 (H) 70 - 99 mg/dL   BUN 15 6 - 20 mg/dL   Creatinine, Ser 0.93 0.61 - 1.24 mg/dL   Calcium 8.8 (L) 8.9 - 10.3 mg/dL   GFR, Estimated >60 >60 mL/min   Anion gap 9 5 - 15       Family Communication: None at bedside Primary team communication: w/ Dr. Maryan Rued Thank you very much for involving [place] in the care of your patient. 45 minutes spent in consultation.  Author: Jonnie Finner, DO 12/16/2021 9:30 AM  For on call review [email or web address].

## 2021-12-16 NOTE — ED Notes (Signed)
Pt called multiple times no answer 

## 2021-12-16 NOTE — ED Triage Notes (Signed)
Pt also states his abscess under the left arm evaluated, says it has been draining white drainage. Noted to have two wounds in the left axilla with small amount of drainage, and peri wound redness. Also reports a new abscess on the left upper arm without drainage. Denies fevers.

## 2021-12-16 NOTE — ED Notes (Signed)
Pt noted to have long periods of apnea, pulse became thready. Remained w/ pt and family.  At 0845 lung sounds, heart sounds auscultated x 1 minute, none noted. Dr. Maryan Rued to bedside to pronounce pt's death. Emotional support provided to family.

## 2021-12-16 NOTE — ED Notes (Signed)
Received a call from [place] lab at 11:39 on 12/16/2021, critical serum osmo 329, results verbally given to Dr. Gilford Raid who verbally acknowledged the results.

## 2021-12-16 NOTE — Discharge Instructions (Addendum)
Your blood sugar was very high today and you have an infection under your armpits.  You need to get back on your insulin so that this does not happen again.  If you start feeling worse, your sugar continues to climb you should return to the hospital.

## 2021-12-21 LAB — CULTURE, BLOOD (ROUTINE X 2)
Culture: NO GROWTH
Culture: NO GROWTH

## 2022-01-03 ENCOUNTER — Ambulatory Visit: Payer: Self-pay | Admitting: Urology

## 2022-01-10 ENCOUNTER — Encounter: Payer: Self-pay | Admitting: Urology

## 2022-01-10 ENCOUNTER — Ambulatory Visit: Payer: Self-pay | Admitting: Urology

## 2022-03-03 ENCOUNTER — Emergency Department (HOSPITAL_COMMUNITY)
Admission: EM | Admit: 2022-03-03 | Discharge: 2022-03-03 | Disposition: A | Discharge status: Home / Self Care | Payer: Commercial Managed Care - HMO | Attending: Emergency Medicine | Admitting: Emergency Medicine

## 2022-03-03 DIAGNOSIS — Z79899 Other long term (current) drug therapy: Secondary | ICD-10-CM | POA: Insufficient documentation

## 2022-03-03 DIAGNOSIS — E1165 Type 2 diabetes mellitus with hyperglycemia: Secondary | ICD-10-CM | POA: Diagnosis not present

## 2022-03-03 DIAGNOSIS — Z8546 Personal history of malignant neoplasm of prostate: Secondary | ICD-10-CM | POA: Insufficient documentation

## 2022-03-03 DIAGNOSIS — R739 Hyperglycemia, unspecified: Secondary | ICD-10-CM

## 2022-03-03 DIAGNOSIS — Z794 Long term (current) use of insulin: Secondary | ICD-10-CM | POA: Diagnosis not present

## 2022-03-03 DIAGNOSIS — R350 Frequency of micturition: Secondary | ICD-10-CM | POA: Diagnosis not present

## 2022-03-03 DIAGNOSIS — I10 Essential (primary) hypertension: Secondary | ICD-10-CM | POA: Diagnosis not present

## 2022-03-03 LAB — URINALYSIS, ROUTINE W REFLEX MICROSCOPIC
Bacteria, UA: NONE SEEN
Bilirubin Urine: NEGATIVE
Glucose, UA: >=500 mg/dL — AB
Hgb urine dipstick: NEGATIVE
Ketones, ur: 20 mg/dL — AB
Leukocytes,Ua: NEGATIVE
Nitrite: NEGATIVE
Protein, ur: 100 mg/dL — AB
Specific Gravity, Urine: 1.031 — ABNORMAL HIGH (ref 1.005–1.030)
pH: 6 (ref 5.0–8.0)

## 2022-03-03 LAB — CBC
HCT: 46 % (ref 39.0–52.0)
Hemoglobin: 15.5 g/dL (ref 13.0–17.0)
MCH: 29.6 pg (ref 26.0–34.0)
MCHC: 33.7 g/dL (ref 30.0–36.0)
MCV: 87.8 fL (ref 80.0–100.0)
Platelets: 338 10*3/uL (ref 150–400)
RBC: 5.24 MIL/uL (ref 4.22–5.81)
RDW: 13.1 % (ref 11.5–15.5)
WBC: 6 10*3/uL (ref 4.0–10.5)
nRBC: 0 % (ref 0.0–0.2)

## 2022-03-03 LAB — BASIC METABOLIC PANEL
Anion gap: 11 (ref 5–15)
BUN: 19 mg/dL (ref 6–20)
CO2: 23 mmol/L (ref 22–32)
Calcium: 8.8 mg/dL — ABNORMAL LOW (ref 8.9–10.3)
Chloride: 95 mmol/L — ABNORMAL LOW (ref 98–111)
Creatinine, Ser: 1.14 mg/dL (ref 0.61–1.24)
GFR, Estimated: >60 mL/min (ref >60)
Glucose, Bld: 455 mg/dL — ABNORMAL HIGH (ref 70–99)
Potassium: 4 mmol/L (ref 3.5–5.1)
Sodium: 129 mmol/L — ABNORMAL LOW (ref 135–145)

## 2022-03-03 LAB — CBG MONITORING, ED
Glucose-Capillary: 423 mg/dL — ABNORMAL HIGH (ref 70–99)
Glucose-Capillary: 444 mg/dL — ABNORMAL HIGH (ref 70–99)
Glucose-Capillary: 454 mg/dL — ABNORMAL HIGH (ref 70–99)
Glucose-Capillary: 371 mg/dL — ABNORMAL HIGH (ref 70–99)
Glucose-Capillary: 240 mg/dL — ABNORMAL HIGH (ref 70–99)

## 2022-03-03 LAB — BLOOD GAS, VENOUS
Acid-Base Excess: 2.2 mmol/L — ABNORMAL HIGH (ref 0.0–2.0)
Bicarbonate: 28.3 mmol/L — ABNORMAL HIGH (ref 20.0–28.0)
O2 Saturation: 60.6 %
Patient temperature: 37
pCO2, Ven: 49 mmHg (ref 44–60)
pH, Ven: 7.37 (ref 7.25–7.43)
pO2, Ven: 35 mmHg (ref 32–45)

## 2022-03-03 MED ORDER — BLOOD GLUCOSE METER KIT: PACK | 0 refills | Status: AC

## 2022-03-03 MED ORDER — NOVOLOG MIX 70/30 FLEXPEN (70-30) 100 UNIT/ML ~~LOC~~ SUPN: 20.0000 [IU] | PEN_INJECTOR | Freq: Two times a day (BID) | SUBCUTANEOUS | 11 refills | Status: DC

## 2022-03-03 MED ORDER — SODIUM CHLORIDE 0.9 % IV BOLUS
1000.0000 mL | Freq: Once | INTRAVENOUS | Status: AC
  Administered 2022-03-03: 1000 mL via INTRAVENOUS

## 2022-03-03 MED ORDER — INSULIN PEN NEEDLE 31G X 5 MM MISC: 1.0000 | Freq: Two times a day (BID) | 0 refills | Status: AC

## 2022-03-03 MED ORDER — DOXYCYCLINE HYCLATE 100 MG PO CAPS: 100.0000 mg | ORAL_CAPSULE | Freq: Two times a day (BID) | ORAL | 0 refills | Status: DC

## 2022-03-03 MED ORDER — INSULIN ASPART 100 UNIT/ML IJ SOLN
8.0000 [IU] | Freq: Once | INTRAMUSCULAR | Status: AC
  Administered 2022-03-03: 8 [IU] via INTRAVENOUS
  Filled 2022-03-03: qty 0.08

## 2022-03-03 MED ORDER — LISINOPRIL 10 MG PO TABS: 10.0000 mg | ORAL_TABLET | ORAL | 1 refills | Status: DC

## 2022-03-03 NOTE — Inpatient Diabetes Management (Signed)
Inpatient Diabetes Program Recommendations  AACE/ADA: New Consensus Statement on Inpatient Glycemic Control (2015)  Target Ranges:  Prepandial:   less than 140 mg/dL      Peak postprandial:   less than 180 mg/dL (1-2 hours)      Critically ill patients:  140 - 180 mg/dL   Lab Results  Component Value Date   GLUCAP 371 (H) 03/03/2022   HGBA1C 11.4 (H) 05/04/2019    Review of Glycemic Control  Latest Reference Range & Units 03/03/22 06:05 03/03/22 10:24 03/03/22 11:40  Glucose-Capillary 70 - 99 mg/dL 444 (H) 454 (H) 371 (H)  (H): Data is abnormally high Diabetes history: Type 2 DM Outpatient Diabetes medications: Novolin 70/30 15 units QA/30 units QP Current orders for Inpatient glycemic control:  Novolog 8 units x 1  Inpatient Diabetes Program Recommendations:    Spoke with patient regarding outpatient diabetes management. Patient has had recent job loss with loss of insurance and has not been able to afford his insulin. A1C has been >11% since 2014.  Explained what a A1c is and what it measures. Also reviewed goal A1c with patient, importance of good glucose control @ home, and blood sugar goals. Reviewed the need for insulin, role of pancreas, Relion insulin, survival skills, interventions, vascular changes and comorbidites.  Patient needs a meter. Reviewed when to check CBGs and when to reach out to MD. [facility] consult placed to help assist patient with follow up.  Patient can afford Medicaid- $4 prescriptions.  Reviewed importance of being mindful with CHO intake. Patient reports that he rarely has sugary beverage intake. Reviewed plate method and importance of protein. Patient has no further questions at this time.   At discharge: -Novolog 70/30 20 units BID  #[id number] -Glucose meter- #[id number] - insulin pen needles- #[id number]  Thanks, Bronson Curb, MSN, RNC-OB Diabetes Coordinator [phone] (8a-5p)

## 2022-03-03 NOTE — Progress Notes (Signed)
TOC consulted for PCP needs. Per MD,pt has lost his insurance. This CSW attempted to contact Amanda to schedule the pt an appointment without success. This CSW has added the PCP information to the pt's AVS. Pt will need to call to schedule a new pt appt if this CSW is unable to schedule it prior to d/c. No further TOC needs at this time.

## 2022-03-03 NOTE — ED Provider Notes (Signed)
[place] EMERGENCY DEPARTMENT AT [facility] Provider Note   CSN: [id number] Arrival date & time: 03/03/22  0105     History  Chief Complaint  Patient presents with   Hyperglycemia   Urinary Frequency    Jonathan Austin is a 57 y.o. male.   Hyperglycemia Urinary Frequency  Patient presents with urinary frequency.  Hyperglycemia.  Has been out of his insulin for about a week due to insurance issues.  States has been urinating more frequently.  Previous history of bladder spasms also and states is somewhat incontinent at times because of this.  States he had to stop work due to the incontinence.  Had been off his bladder spasm medicine also.  Had not been checking his sugar at home.  Urinating frequently.  Had been on 70/30 insulin Also states there is a swelling in his left armpit.  Has had previous abscesses.  No drainage.  No fevers.    Past Medical History:  Diagnosis Date   Alcohol use disorder, severe, dependence ([place])    05-24-2019 recent [facility] admission for suicide ideation and alcohol / cocaine detox 05-04-2019 in epic,  pt stated has been taking naltrexone daily as prescribed since and no alcohol since   Cocaine abuse ([place])    05-24-2019  pt had recent [facility] admission, pt stated last used  "crack" 05-03-2019   Cocaine abuse with cocaine-induced mood disorder ([place])    Depression    GERD (gastroesophageal reflux disease)    History of suicidal ideation    multiple [facility] admission's;  last one admitted 05-04-2019   Hyperplasia of prostate with lower urinary tract symptoms (LUTS)    Hypertension    followed by pcp   OSA on CPAP    uses intermittently   Phimosis    Prostate cancer [facility]) urologist-- dr winter   dx 02/ 2021;  localized advanced, gleason 4+4, PSA 317   Renal insufficiency    Sinus tachycardia    Thoracic aortic atherosclerosis ([place])    Type 2 diabetes mellitus treated with insulin ([place])    followed by pcp   (05-24-2019  per stated checks blood  sugar twice daily, fasting sugar-- 180-200)   Wears glasses     Home Medications Prior to Admission medications   Medication Sig Start Date End Date Taking? Authorizing Provider  blood glucose meter kit and supplies Dispense based on patient and insurance preference. Use up to four times daily as directed. (FOR ICD-10 E10.9, E11.9). 03/03/22  Yes Davonna Belling, MD  insulin aspart protamine - aspart (NOVOLOG MIX 70/30 FLEXPEN) (70-30) 100 UNIT/ML FlexPen Inject 20 Units into the skin 2 (two) times daily. 03/03/22  Yes Davonna Belling, MD  Insulin Pen Needle 31G X 5 MM MISC 1 Needle by Does not apply route 2 (two) times daily. 03/03/22  Yes Davonna Belling, MD  acetaminophen (TYLENOL) 325 MG tablet Take 650 mg by mouth every 6 (six) hours as needed for moderate pain.    [provider]  AMBULATORY NON FORMULARY MEDICATION Trimix (30/1/10)-(Pap/Phent/PGE)  Test Dose  18m vial   Qty #3 [phone] [phone]/31/23   MZara CouncilA, PA-C  amLODipine (NORVASC) 10 MG tablet Take 1 tablet (10 mg total) by mouth daily. For high blood pressure 01/27/18   NLindell SparI, NP  calcium carbonate (TUMS - DOSED IN MG ELEMENTAL CALCIUM) 500 MG chewable tablet Chew 1 tablet by mouth as needed for indigestion or heartburn.  [provider]  doxycycline (VIBRAMYCIN) 100 MG capsule Take 1 capsule (100 mg total) by mouth 2 (two) times daily. 03/03/22   Davonna Belling, MD  lisinopril (ZESTRIL) 10 MG tablet Take 1 tablet (10 mg total) by mouth every morning. 03/03/22   Davonna Belling, MD  rosuvastatin (CRESTOR) 20 MG tablet Take 20 mg by mouth every morning. 04/01/21   [provider]  solifenacin (VESICARE) 10 MG tablet Take 1 tablet (10 mg total) by mouth daily. 05/13/21   Stoioff, Ronda Fairly, MD  traMADol (ULTRAM) 50 MG tablet Take 1 tablet (50 mg total) by mouth every 6 (six) hours as needed for moderate pain. 06/08/21   Stoioff, Ronda Fairly, MD      Allergies    Patient has no known allergies.    Review of Systems   Review of Systems  Genitourinary:  Positive for frequency.    Physical Exam Updated Vital Signs BP (!) 137/97   Pulse (!) 101   Temp (!) 97.5 F (36.4 C)   Resp 18   SpO2 100%  Physical Exam Vitals and nursing note reviewed.  Cardiovascular:     Rate and Rhythm: Tachycardia present.  Pulmonary:     Breath sounds: Normal breath sounds.  Abdominal:     Tenderness: There is no abdominal tenderness.  Skin:    Comments: Left axilla has few small firm masses.  No fluctuance.  No erythema.  Scars from previous drainages.  Neurological:     Mental Status: He is alert.     ED Results / Procedures / Treatments   Labs (all labs ordered are listed, but only abnormal results are displayed) Labs Reviewed  BASIC METABOLIC PANEL - Abnormal; Notable for the following components:      Result Value   Sodium 129 (*)    Chloride 95 (*)    Glucose, Bld 455 (*)    Calcium 8.8 (*)    All other components within normal limits  URINALYSIS, ROUTINE W REFLEX MICROSCOPIC - Abnormal; Notable for the following components:   Specific Gravity, Urine 1.031 (*)    Glucose, UA >=500 (*)    Ketones, ur 20 (*)    Protein, ur 100 (*)    All other components within normal limits  BLOOD GAS, VENOUS - Abnormal; Notable for the following components:   Bicarbonate 28.3 (*)    Acid-Base Excess 2.2 (*)    All other components within normal limits  CBG MONITORING, ED - Abnormal; Notable for the following components:   Glucose-Capillary 423 (*)    All other components within normal limits  CBG MONITORING, ED - Abnormal; Notable for the following components:   Glucose-Capillary 444 (*)    All other components within normal limits  CBG MONITORING, ED - Abnormal; Notable for the following components:   Glucose-Capillary 454 (*)    All other components within normal limits  CBG MONITORING, ED - Abnormal; Notable for the  following components:   Glucose-Capillary 371 (*)    All other components within normal limits  CBG MONITORING, ED - Abnormal; Notable for the following components:   Glucose-Capillary 240 (*)    All other components within normal limits  CBC    EKG None  Radiology No results found.  Procedures Procedures    Medications Ordered in ED Medications  sodium chloride 0.9 % bolus 1,000 mL (0 mLs Intravenous Stopped 03/03/22 1130)  insulin aspart (novoLOG) injection 8 Units (8 Units Intravenous Given 03/03/22 1039)  sodium chloride 0.9 %  bolus 1,000 mL (0 mLs Intravenous Stopped 03/03/22 1345)  insulin aspart (novoLOG) injection 8 Units (8 Units Intravenous Given 03/03/22 1246)    ED Course/ Medical Decision Making/ A&P                             Medical Decision Making Amount and/or Complexity of Data Reviewed Labs: ordered.  Risk OTC drugs. Prescription drug management.   Patient with hyperglycemia.  Noncompliant with medication due to money issues.  Appears hyperglycemic without DKA.  VBG is normal.  Will give fluid bolus and insulin.  Hopefully sugars get down started medicines. Also few firm masses left axilla.  Abscess versus potentially hidradenitis.  Do not see any that need drainage at this time.  Will treat with antibiotics however due to the hyperglycemia.  Does not appear septic.  Has been seen by diabetes educator/coordinator.  Adjusted medicines and allow me to order supplies and insulin.  Will discharge home.  Well-appearing.  Sugars now down to 240 and can test and treat at home.  He did.  Will a coffee        Final Clinical Impression(s) / ED Diagnoses Final diagnoses:  Hyperglycemia    Rx / DC Orders ED Discharge Orders          Ordered    insulin aspart protamine - aspart (NOVOLOG MIX 70/30 FLEXPEN) (70-30) 100 UNIT/ML FlexPen  2 times daily        03/03/22 1307    blood glucose meter kit and supplies        03/03/22 1307    Insulin Pen Needle  31G X 5 MM MISC  2 times daily        03/03/22 1307    doxycycline (VIBRAMYCIN) 100 MG capsule  2 times daily        03/03/22 1307    lisinopril (ZESTRIL) 10 MG tablet  BH-each morning        03/03/22 1402              Davonna Belling, MD 03/03/22 1410

## 2022-03-03 NOTE — ED Triage Notes (Signed)
Presents from home for hyperglycemia (has not had insulin in 1 week), polyuria, urinary frequency and urgency (was being seen by urologist but now has no insurance to continue meds for bladder spasms).  Denies pain, fever, chills

## 2022-03-03 NOTE — Discharge Instructions (Addendum)
There is infection versus inflammation of your armpit on the left.  Antibiotics may help.  You have been filled with the medications.  Follow-up with your doctor

## 2022-03-03 NOTE — ED Notes (Signed)
Lab said the VBG clotted and needs to be recollected

## 2022-03-28 ENCOUNTER — Ambulatory Visit (INDEPENDENT_AMBULATORY_CARE_PROVIDER_SITE_OTHER): Payer: Medicaid Other | Admitting: Urology

## 2022-03-28 VITALS — BP 134/92 | HR 128 | Ht 67.0 in | Wt 230.0 lb

## 2022-03-28 DIAGNOSIS — N3946 Mixed incontinence: Secondary | ICD-10-CM | POA: Diagnosis not present

## 2022-03-28 LAB — URINALYSIS, COMPLETE
Bilirubin, UA: NEGATIVE
Glucose, UA: NEGATIVE
Leukocytes,UA: NEGATIVE
Nitrite, UA: NEGATIVE
Specific Gravity, UA: 1.02 (ref 1.005–1.030)
Urobilinogen, Ur: 0.2 mg/dL (ref 0.2–1.0)
pH, UA: 5.5 (ref 5.0–7.5)

## 2022-03-28 LAB — MICROSCOPIC EXAMINATION: Bacteria, UA: NONE SEEN

## 2022-03-28 NOTE — Progress Notes (Signed)
03/28/2022 9:00 AM   Jonathan Austin December 06, 1965 [id number]  Referring provider: Collene Leyden, MD [address] [place],   [address]  No chief complaint on file.   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  Transurethral resection of prostate April 2021 by Dr. Lovena Neighbours.  Has had a radiation.  Recent PSA 0.1.  Has failed Vesicare and Myrbetriq for mixed incontinence.  In May 2023 had a urethral stricture dilated.  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  He had radiation changes in his bladder.   A few years ago he was seeing Dr. Alinda Money.  He had positive lymph nodes.  It was recommended to have transurethral resection of the prostate prior to planned radiation for his significant lower urinary tract symptoms.  It was felt that he had locally advanced disease causing some of the symptoms.  According to Dr. Lovena Neighbours he had bothersome urgency frequency and a poor flow in spite of Flomax prior to surgery or he had a channel TURP and circumcision.  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  Prior to dilation he had worsening lower urinary tract symptoms including urgency and incontinence.  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  I think he has some rigidity in the prostatic urethra.  He had radiation changes.   Currently leaks a small amount with coughing sneezing bending lifting.  He leaks more when he is active.  Primary problem is urge incontinence.  He has moderately severe bedwetting.  He can soak 3-4 pads a day.  He said he was continent after the transurethral resection of the prostate and only started to leak a few months prior to the urethral dilation.  I do not see any urodynamics done prior to the transurethral section of the prostate.  I did not see bladder capacity measured under anesthesia  He voids every 1 hour.   No nocturia.  Flow was good.  Right-handed and has had a hernia repair.  He is an insulin-dependent diabetic  He had shoulder penis in a standing position I think he did have a positive cough test.  Male genitalia otherwise normal    Patient has mixed incontinence.  He has bedwetting.  On suspect that he has a small spastic bladder.  He likely may also have urethral insufficiency which is less common postradiation and transurethral section of the prostate.  He has had a stricture which would make it sphincter more difficult to place in the bulbar urethra.  He may need repeat cystoscopy in the future.  Call if culture positive.  Order urodynamics and proceed accordingly.  He may need to be supine his bladder neck occluded with a small catheter to measure his bladder capacity if he starts leaking a lot at low volumes during urodynamics   Today When the patient saw the nurse practitioner recently he apparently had no insurance and therefore could not undergo urodynamics and cystoscopy. Patient still has high-volume mixed incontinence especially when his sugars are out of control.  He is on insulin.  He has sleep apnea.  He now has Medicaid and would like to organize the urodynamics.  Clinically not infected.  I urged him to follow-up with the cancer center in [facility] for his ongoing prostate cancer surveillance and treatment     PMH: Past Medical History:  Diagnosis Date  Alcohol use disorder, severe, dependence ([place])    05-24-2019 recent [facility] admission for suicide ideation and alcohol / cocaine detox 05-04-2019 in epic,  pt stated has been taking naltrexone daily as prescribed since and no alcohol since   Cocaine abuse ([place])    05-24-2019  pt had recent [facility] admission, pt stated last used  "crack" 05-03-2019   Cocaine abuse with cocaine-induced mood disorder ([place])    Depression    GERD (gastroesophageal reflux disease)    History of suicidal ideation    multiple [facility] admission's;  last  one admitted 05-04-2019   Hyperplasia of prostate with lower urinary tract symptoms (LUTS)    Hypertension    followed by pcp   OSA on CPAP    uses intermittently   Phimosis    Prostate cancer [facility]) urologist-- dr winter   dx 02/ 2021;  localized advanced, gleason 4+4, PSA 317   Renal insufficiency    Sinus tachycardia    Thoracic aortic atherosclerosis ([place])    Type 2 diabetes mellitus treated with insulin ([place])    followed by pcp   (05-24-2019  per stated checks blood sugar twice daily, fasting sugar-- 180-200)   Wears glasses     Surgical History: Past Surgical History:  Procedure Laterality Date   CIRCUMCISION N/A 05/29/2019   Procedure: CIRCUMCISION ADULT;  Surgeon: Ceasar Mons, MD;  Location: [facility];  Service: Urology;  Laterality: N/A;   CYSTOSCOPY WITH URETHRAL DILATATION N/A 06/08/2021   Procedure: CYSTOSCOPY WITH URETHRAL DILATATION;  Surgeon: Abbie Sons, MD;  Location: [facility];  Service: Urology;  Laterality: N/A;   PROSTATE BIOPSY     TONSILLECTOMY  child   TRANSURETHRAL RESECTION OF PROSTATE N/A 05/29/2019   Procedure: TRANSURETHRAL RESECTION OF THE PROSTATE (TURP)/ CYSTOSCOPY;  Surgeon: Ceasar Mons, MD;  Location: [facility];  Service: Urology;  Laterality: N/A;    Home Medications:  Allergies as of 03/28/2022   No Known Allergies      Medication List        Accurate as of March 28, 2022  9:00 AM. If you have any questions, ask your nurse or doctor.          acetaminophen 325 MG tablet Commonly known as: TYLENOL Take 650 mg by mouth every 6 (six) hours as needed for moderate pain.   AMBULATORY NON FORMULARY MEDICATION Trimix (30/1/10)-(Pap/Phent/PGE)  Test Dose  1m vial   Qty #3 [phone] [phone]  amLODipine 10 MG tablet Commonly known as: NORVASC Take 1 tablet (10 mg total) by mouth daily. For high blood pressure   blood  glucose meter kit and supplies Dispense based on patient and insurance preference. Use up to four times daily as directed. (FOR ICD-10 E10.9, E11.9).   calcium carbonate 500 MG chewable tablet Commonly known as: TUMS - dosed in mg elemental calcium Chew 1 tablet by mouth as needed for indigestion or heartburn.   doxycycline 100 MG capsule Commonly known as: VIBRAMYCIN Take 1 capsule (100 mg total) by mouth 2 (two) times daily.   Insulin Pen Needle 31G X 5 MM Misc 1 Needle by Does not apply route 2 (two) times daily.   lisinopril 10 MG tablet Commonly known as: ZESTRIL Take 1 tablet (10 mg total) by mouth every morning.   NovoLOG Mix 70/30 FlexPen (70-30) 100 UNIT/ML FlexPen Generic drug: insulin aspart protamine - aspart Inject 20 Units into the skin 2 (two) times  daily.   rosuvastatin 20 MG tablet Commonly known as: CRESTOR Take 20 mg by mouth every morning.   solifenacin 10 MG tablet Commonly known as: VESICARE Take 1 tablet (10 mg total) by mouth daily.   traMADol 50 MG tablet Commonly known as: ULTRAM Take 1 tablet (50 mg total) by mouth every 6 (six) hours as needed for moderate pain.        Allergies: No Known Allergies  Family History: Family History  Problem Relation Age of Onset   Hypertension Mother    Hypertension Father    Diabetes Father    Prostate cancer Maternal Grandfather 65   Breast cancer Neg Hx    Colon cancer Neg Hx    Pancreatic cancer Neg Hx     Social History:  reports that he has never smoked. He has never been exposed to tobacco smoke. He has never used smokeless tobacco. He reports that he does not currently use alcohol. He reports that he does not currently use drugs after having used the following drugs: Cocaine and Marijuana.  ROS:                                        Physical Exam: There were no vitals taken for this visit.  Constitutional:  Alert and oriented, No acute distress. HEENT: [place] AT, moist  mucus membranes.  Trachea midline, no masses.   Laboratory Data: Lab Results  Component Value Date   WBC 6.0 03/03/2022   HGB 15.5 03/03/2022   HCT 46.0 03/03/2022   MCV 87.8 03/03/2022   PLT 338 03/03/2022    Lab Results  Component Value Date   CREATININE 1.14 03/03/2022    No results found for: "PSA"  Lab Results  Component Value Date   TESTOSTERONE 9 (L) 08/26/2019    Lab Results  Component Value Date   HGBA1C 11.4 (H) 05/04/2019    Urinalysis    Component Value Date/Time   COLORURINE YELLOW 03/03/2022 0300   APPEARANCEUR CLEAR 03/03/2022 0300   APPEARANCEUR Clear 09/06/2021 1043   LABSPEC 1.031 (H) 03/03/2022 0300   PHURINE 6.0 03/03/2022 0300   GLUCOSEU >=500 (A) 03/03/2022 0300   HGBUR NEGATIVE 03/03/2022 0300   BILIRUBINUR NEGATIVE 03/03/2022 0300   BILIRUBINUR Negative 09/06/2021 1043   KETONESUR 20 (A) 03/03/2022 0300   PROTEINUR 100 (A) 03/03/2022 0300   UROBILINOGEN 0.2 12/17/2014 1649   NITRITE NEGATIVE 03/03/2022 0300   LEUKOCYTESUR NEGATIVE 03/03/2022 0300    Pertinent Imaging:   Assessment & Plan: Follow-up with urodynamics and proceed accordingly.  Currently his flow was good.  I am suspect that he has a small capacity overactive bladder in combination with urethral insufficiency  1. Mixed stress and urge urinary incontinence    No follow-ups on file.  Reece Packer, MD  [place] [place] [address] [place], [place] [address] [phone]

## 2022-04-11 ENCOUNTER — Encounter (HOSPITAL_COMMUNITY): Payer: Self-pay | Admitting: Nurse Practitioner

## 2022-04-11 ENCOUNTER — Encounter (HOSPITAL_COMMUNITY): Payer: Self-pay | Admitting: Emergency Medicine

## 2022-04-11 ENCOUNTER — Inpatient Hospital Stay (HOSPITAL_COMMUNITY)
Admission: AD | Admit: 2022-04-11 | Discharge: 2022-04-16 | DRG: 885 | Disposition: A | Discharge status: Home / Self Care | Payer: Commercial Managed Care - HMO | Source: Intra-hospital | Attending: Psychiatry | Admitting: Psychiatry

## 2022-04-11 ENCOUNTER — Emergency Department (EMERGENCY_DEPARTMENT_HOSPITAL)
Admission: EM | Admit: 2022-04-11 | Discharge: 2022-04-11 | Disposition: A | Discharge status: Other Acute Inpatient Hospital | Payer: Commercial Managed Care - HMO | Source: Home / Self Care | Attending: Emergency Medicine | Admitting: Emergency Medicine

## 2022-04-11 ENCOUNTER — Other Ambulatory Visit: Payer: Self-pay

## 2022-04-11 DIAGNOSIS — Z9152 Personal history of nonsuicidal self-harm: Secondary | ICD-10-CM

## 2022-04-11 DIAGNOSIS — F332 Major depressive disorder, recurrent severe without psychotic features: Secondary | ICD-10-CM | POA: Diagnosis present

## 2022-04-11 DIAGNOSIS — K219 Gastro-esophageal reflux disease without esophagitis: Secondary | ICD-10-CM | POA: Diagnosis present

## 2022-04-11 DIAGNOSIS — F329 Major depressive disorder, single episode, unspecified: Secondary | ICD-10-CM | POA: Diagnosis present

## 2022-04-11 DIAGNOSIS — G4733 Obstructive sleep apnea (adult) (pediatric): Secondary | ICD-10-CM | POA: Diagnosis present

## 2022-04-11 DIAGNOSIS — F10288 Alcohol dependence with other alcohol-induced disorder: Secondary | ICD-10-CM | POA: Insufficient documentation

## 2022-04-11 DIAGNOSIS — F1420 Cocaine dependence, uncomplicated: Secondary | ICD-10-CM | POA: Diagnosis present

## 2022-04-11 DIAGNOSIS — F1020 Alcohol dependence, uncomplicated: Secondary | ICD-10-CM | POA: Diagnosis present

## 2022-04-11 DIAGNOSIS — R45851 Suicidal ideations: Secondary | ICD-10-CM | POA: Diagnosis present

## 2022-04-11 DIAGNOSIS — Z794 Long term (current) use of insulin: Secondary | ICD-10-CM

## 2022-04-11 DIAGNOSIS — Z79899 Other long term (current) drug therapy: Secondary | ICD-10-CM | POA: Insufficient documentation

## 2022-04-11 DIAGNOSIS — Z1152 Encounter for screening for COVID-19: Secondary | ICD-10-CM | POA: Insufficient documentation

## 2022-04-11 DIAGNOSIS — Z8546 Personal history of malignant neoplasm of prostate: Secondary | ICD-10-CM | POA: Diagnosis not present

## 2022-04-11 DIAGNOSIS — N529 Male erectile dysfunction, unspecified: Secondary | ICD-10-CM | POA: Diagnosis present

## 2022-04-11 DIAGNOSIS — I10 Essential (primary) hypertension: Secondary | ICD-10-CM | POA: Diagnosis present

## 2022-04-11 DIAGNOSIS — E785 Hyperlipidemia, unspecified: Secondary | ICD-10-CM | POA: Diagnosis present

## 2022-04-11 DIAGNOSIS — E119 Type 2 diabetes mellitus without complications: Secondary | ICD-10-CM | POA: Diagnosis present

## 2022-04-11 DIAGNOSIS — Z560 Unemployment, unspecified: Secondary | ICD-10-CM | POA: Diagnosis not present

## 2022-04-11 DIAGNOSIS — Z20822 Contact with and (suspected) exposure to covid-19: Secondary | ICD-10-CM | POA: Diagnosis present

## 2022-04-11 DIAGNOSIS — Y901 Blood alcohol level of 20-39 mg/100 ml: Secondary | ICD-10-CM | POA: Diagnosis present

## 2022-04-11 DIAGNOSIS — Z9151 Personal history of suicidal behavior: Secondary | ICD-10-CM

## 2022-04-11 DIAGNOSIS — Z833 Family history of diabetes mellitus: Secondary | ICD-10-CM

## 2022-04-11 LAB — RAPID URINE DRUG SCREEN, HOSP PERFORMED
Amphetamines: NOT DETECTED
Barbiturates: NOT DETECTED
Benzodiazepines: NOT DETECTED
Cocaine: POSITIVE — AB
Opiates: NOT DETECTED
Tetrahydrocannabinol: NOT DETECTED

## 2022-04-11 LAB — COMPREHENSIVE METABOLIC PANEL
ALT: 28 U/L (ref 0–44)
AST: 21 U/L (ref 15–41)
Albumin: 3.4 g/dL — ABNORMAL LOW (ref 3.5–5.0)
Alkaline Phosphatase: 72 U/L (ref 38–126)
Anion gap: 12 (ref 5–15)
BUN: 13 mg/dL (ref 6–20)
CO2: 20 mmol/L — ABNORMAL LOW (ref 22–32)
Calcium: 8.8 mg/dL — ABNORMAL LOW (ref 8.9–10.3)
Chloride: 99 mmol/L (ref 98–111)
Creatinine, Ser: 1.19 mg/dL (ref 0.61–1.24)
GFR, Estimated: >60 mL/min (ref >60)
Glucose, Bld: 303 mg/dL — ABNORMAL HIGH (ref 70–99)
Potassium: 4.3 mmol/L (ref 3.5–5.1)
Sodium: 131 mmol/L — ABNORMAL LOW (ref 135–145)
Total Bilirubin: 0.4 mg/dL (ref 0.3–1.2)
Total Protein: 6.8 g/dL (ref 6.5–8.1)

## 2022-04-11 LAB — CBC
HCT: 43.4 % (ref 39.0–52.0)
Hemoglobin: 15 g/dL (ref 13.0–17.0)
MCH: 30.5 pg (ref 26.0–34.0)
MCHC: 34.6 g/dL (ref 30.0–36.0)
MCV: 88.4 fL (ref 80.0–100.0)
Platelets: 363 10*3/uL (ref 150–400)
RBC: 4.91 MIL/uL (ref 4.22–5.81)
RDW: 13.2 % (ref 11.5–15.5)
WBC: 5.3 10*3/uL (ref 4.0–10.5)
nRBC: 0 % (ref 0.0–0.2)

## 2022-04-11 LAB — CBG MONITORING, ED
Glucose-Capillary: 216 mg/dL — ABNORMAL HIGH (ref 70–99)
Glucose-Capillary: 277 mg/dL — ABNORMAL HIGH (ref 70–99)

## 2022-04-11 LAB — RESP PANEL BY RT-PCR (RSV, FLU A&B, COVID)  RVPGX2
Influenza A by PCR: NEGATIVE
Influenza B by PCR: NEGATIVE
Resp Syncytial Virus by PCR: NEGATIVE
SARS Coronavirus 2 by RT PCR: NEGATIVE

## 2022-04-11 LAB — ACETAMINOPHEN LEVEL: Acetaminophen (Tylenol), Serum: <10 ug/mL — ABNORMAL LOW (ref 10–30)

## 2022-04-11 LAB — SALICYLATE LEVEL: Salicylate Lvl: <7 mg/dL — ABNORMAL LOW (ref 7.0–30.0)

## 2022-04-11 LAB — ETHANOL: Alcohol, Ethyl (B): 29 mg/dL — ABNORMAL HIGH (ref <10)

## 2022-04-11 MED ORDER — ALUM & MAG HYDROXIDE-SIMETH 200-200-20 MG/5ML PO SUSP: 30.0000 mL | ORAL | Status: DC | PRN

## 2022-04-11 MED ORDER — HALOPERIDOL LACTATE 5 MG/ML IJ SOLN: 5.0000 mg | Freq: Three times a day (TID) | INTRAMUSCULAR | Status: DC | PRN

## 2022-04-11 MED ORDER — ROSUVASTATIN CALCIUM 20 MG PO TABS
20.0000 mg | ORAL_TABLET | ORAL | Status: DC
  Administered 2022-04-11: 20 mg via ORAL
  Filled 2022-04-11: qty 1

## 2022-04-11 MED ORDER — DIPHENHYDRAMINE HCL 50 MG/ML IJ SOLN: 50.0000 mg | Freq: Three times a day (TID) | INTRAMUSCULAR | Status: DC | PRN

## 2022-04-11 MED ORDER — ROSUVASTATIN CALCIUM 20 MG PO TABS
20.0000 mg | ORAL_TABLET | ORAL | Status: DC
  Administered 2022-04-12 – 2022-04-16 (×5): 20 mg via ORAL
  Filled 2022-04-11 (×7): qty 1

## 2022-04-11 MED ORDER — LISINOPRIL 10 MG PO TABS
10.0000 mg | ORAL_TABLET | ORAL | Status: DC
  Administered 2022-04-11: 10 mg via ORAL
  Filled 2022-04-11: qty 1

## 2022-04-11 MED ORDER — HYDROXYZINE HCL 25 MG PO TABS: 25.0000 mg | ORAL_TABLET | Freq: Three times a day (TID) | ORAL | Status: DC | PRN

## 2022-04-11 MED ORDER — INSULIN ASPART PROT & ASPART (70-30 MIX) 100 UNIT/ML ~~LOC~~ SUSP
20.0000 [IU] | Freq: Two times a day (BID) | SUBCUTANEOUS | Status: DC
  Administered 2022-04-12 – 2022-04-14 (×5): 20 [IU] via SUBCUTANEOUS

## 2022-04-11 MED ORDER — AMLODIPINE BESYLATE 5 MG PO TABS
10.0000 mg | ORAL_TABLET | Freq: Every day | ORAL | Status: DC
  Administered 2022-04-11: 10 mg via ORAL
  Filled 2022-04-11: qty 2

## 2022-04-11 MED ORDER — TRAZODONE HCL 50 MG PO TABS: 50.0000 mg | ORAL_TABLET | Freq: Every evening | ORAL | Status: DC | PRN

## 2022-04-11 MED ORDER — DIPHENHYDRAMINE HCL 25 MG PO CAPS: 50.0000 mg | ORAL_CAPSULE | Freq: Three times a day (TID) | ORAL | Status: DC | PRN

## 2022-04-11 MED ORDER — LORAZEPAM 2 MG/ML IJ SOLN: 2.0000 mg | Freq: Three times a day (TID) | INTRAMUSCULAR | Status: DC | PRN

## 2022-04-11 MED ORDER — HALOPERIDOL 5 MG PO TABS: 5.0000 mg | ORAL_TABLET | Freq: Three times a day (TID) | ORAL | Status: DC | PRN

## 2022-04-11 MED ORDER — MAGNESIUM HYDROXIDE 400 MG/5ML PO SUSP: 30.0000 mL | Freq: Every day | ORAL | Status: DC | PRN

## 2022-04-11 MED ORDER — INSULIN ASPART 100 UNIT/ML IJ SOLN
0.0000 [IU] | Freq: Three times a day (TID) | INTRAMUSCULAR | Status: DC
  Administered 2022-04-11: 5 [IU] via SUBCUTANEOUS
  Administered 2022-04-11: 8 [IU] via SUBCUTANEOUS

## 2022-04-11 MED ORDER — LISINOPRIL 10 MG PO TABS
10.0000 mg | ORAL_TABLET | ORAL | Status: DC
  Administered 2022-04-12 – 2022-04-16 (×5): 10 mg via ORAL
  Filled 2022-04-11 (×7): qty 1

## 2022-04-11 MED ORDER — LORAZEPAM 1 MG PO TABS: 2.0000 mg | ORAL_TABLET | Freq: Three times a day (TID) | ORAL | Status: DC | PRN

## 2022-04-11 MED ORDER — ACETAMINOPHEN 325 MG PO TABS
650.0000 mg | ORAL_TABLET | Freq: Four times a day (QID) | ORAL | Status: DC | PRN
  Administered 2022-04-12: 650 mg via ORAL
  Filled 2022-04-11: qty 2

## 2022-04-11 MED ORDER — INSULIN ASPART 100 UNIT/ML IJ SOLN
0.0000 [IU] | Freq: Three times a day (TID) | INTRAMUSCULAR | Status: DC
  Administered 2022-04-12: 15 [IU] via SUBCUTANEOUS
  Administered 2022-04-12: 2 [IU] via SUBCUTANEOUS
  Administered 2022-04-12: 8 [IU] via SUBCUTANEOUS
  Administered 2022-04-13 (×2): 3 [IU] via SUBCUTANEOUS
  Administered 2022-04-13 – 2022-04-14 (×2): 5 [IU] via SUBCUTANEOUS
  Administered 2022-04-14 (×2): 3 [IU] via SUBCUTANEOUS
  Administered 2022-04-15: 2 [IU] via SUBCUTANEOUS
  Administered 2022-04-15: 8 [IU] via SUBCUTANEOUS
  Administered 2022-04-16: 2 [IU] via SUBCUTANEOUS

## 2022-04-11 MED ORDER — AMLODIPINE BESYLATE 10 MG PO TABS
10.0000 mg | ORAL_TABLET | Freq: Every day | ORAL | Status: DC
  Administered 2022-04-12 – 2022-04-16 (×5): 10 mg via ORAL
  Filled 2022-04-11 (×6): qty 1

## 2022-04-11 MED ORDER — INSULIN ASPART PROT & ASPART (70-30 MIX) 100 UNIT/ML ~~LOC~~ SUSP
20.0000 [IU] | Freq: Two times a day (BID) | SUBCUTANEOUS | Status: DC
  Administered 2022-04-11: 20 [IU] via SUBCUTANEOUS
  Filled 2022-04-11: qty 10

## 2022-04-11 MED ORDER — INSULIN ASPART 100 UNIT/ML IJ SOLN: 0.0000 [IU] | Freq: Every day | INTRAMUSCULAR | Status: DC

## 2022-04-11 MED ORDER — INSULIN ASPART 100 UNIT/ML IJ SOLN
0.0000 [IU] | Freq: Every day | INTRAMUSCULAR | Status: DC
  Administered 2022-04-11 – 2022-04-15 (×4): 2 [IU] via SUBCUTANEOUS

## 2022-04-11 NOTE — Plan of Care (Signed)
Nurse discussed coping skills with patient.  
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
## 2022-04-11 NOTE — ED Notes (Signed)
Pt  is voluntary consent form attached to the clipboard in blue zone

## 2022-04-11 NOTE — ED Notes (Signed)
Sate tx arrived paperwork given to nurse for tx to [facility]

## 2022-04-11 NOTE — ED Triage Notes (Signed)
Pt reports SI since yesterday, states he has multiple health problems and financial difficulties that contribute to SI

## 2022-04-11 NOTE — ED Provider Notes (Signed)
[facility] Provider Note   CSN: [id number] Arrival date & time: 04/11/22  0242     History  Chief Complaint  Patient presents with   Suicidal    Jonathan Austin is a 57 y.o. male.  57 year old male presents with complaint of suicidal ideation with plan to cut wrists.  States that he attempted suicide by cutting wrist 8 years ago.  States that his chronic health problems including all as his drug use including crack cocaine with last use prior to walking in the door tonight and heavy alcohol use or leading him to feel suicidal today.       Home Medications Prior to Admission medications   Medication Sig Start Date End Date Taking? Authorizing Provider  acetaminophen (TYLENOL) 325 MG tablet Take 650 mg by mouth every 6 (six) hours as needed for moderate pain.    [provider]  AMBULATORY NON FORMULARY MEDICATION Trimix (30/1/10)-(Pap/Phent/PGE)  Test Dose  57m vial   Qty #3 [id number] [phone]/31/23   MZara CouncilA, PA-C  amLODipine (NORVASC) 10 MG tablet Take 1 tablet (10 mg total) by mouth daily. For high blood pressure 01/27/18   NLindell SparI, NP  blood glucose meter kit and supplies Dispense based on patient and insurance preference. Use up to four times daily as directed. (FOR ICD-10 E10.9, E11.9). 03/03/22   PDavonna Belling MD  calcium carbonate (TUMS - DOSED IN MG ELEMENTAL CALCIUM) 500 MG chewable tablet Chew 1 tablet by mouth as needed for indigestion or heartburn.    [provider]  doxycycline (VIBRAMYCIN) 100 MG capsule Take 1 capsule (100 mg total) by mouth 2 (two) times daily. 03/03/22   PDavonna Belling MD  insulin aspart protamine - aspart (NOVOLOG MIX 70/30 FLEXPEN) (70-30) 100 UNIT/ML FlexPen Inject 20 Units into the skin 2 (two) times daily. 03/03/22   PDavonna Belling MD  Insulin Pen Needle 31G X 5 MM MISC 1 Needle by Does not apply route  2 (two) times daily. 03/03/22   PDavonna Belling MD  lisinopril (ZESTRIL) 10 MG tablet Take 1 tablet (10 mg total) by mouth every morning. 03/03/22   PDavonna Belling MD  rosuvastatin (CRESTOR) 20 MG tablet Take 20 mg by mouth every morning. 04/01/21   [provider]  solifenacin (VESICARE) 10 MG tablet Take 1 tablet (10 mg total) by mouth daily. 05/13/21   Stoioff, SRonda Fairly MD  traMADol (ULTRAM) 50 MG tablet Take 1 tablet (50 mg total) by mouth every 6 (six) hours as needed for moderate pain. 06/08/21   Stoioff, SRonda Fairly MD      Allergies    Patient has no known allergies.    Review of Systems   Review of Systems Negative except as per HPI Physical Exam Updated Vital Signs BP 118/79 (BP Location: Right Arm)   Pulse (!) 109   Temp 98.7 F (37.1 C) (Oral)   Resp 16   Ht '5\' 7"'$  (1.702 m)   Wt 104.3 kg   SpO2 98%   BMI 36.01 kg/m  Physical Exam Vitals and nursing note reviewed.  Constitutional:      General: He is not in acute distress.    Appearance: He is well-developed. He is not diaphoretic.  HENT:     Head: Normocephalic and atraumatic.  Cardiovascular:     Rate and Rhythm: Normal rate and regular rhythm.     Heart sounds: Normal heart sounds.  Pulmonary:     Effort: Pulmonary effort is normal.     Breath sounds: Normal breath sounds.  Abdominal:     Palpations: Abdomen is soft.     Tenderness: There is no abdominal tenderness.  Skin:    General: Skin is warm and dry.     Findings: No erythema or rash.  Neurological:     Mental Status: He is alert and oriented to person, place, and time.  Psychiatric:        Behavior: Behavior normal.     ED Results / Procedures / Treatments   Labs (all labs ordered are listed, but only abnormal results are displayed) Labs Reviewed  COMPREHENSIVE METABOLIC PANEL - Abnormal; Notable for the following components:      Result Value   Sodium 131 (*)    CO2 20 (*)    Glucose, Bld 303 (*)    Calcium 8.8 (*)    Albumin  3.4 (*)    All other components within normal limits  ETHANOL - Abnormal; Notable for the following components:   Alcohol, Ethyl (B) 29 (*)    All other components within normal limits  SALICYLATE LEVEL - Abnormal; Notable for the following components:   Salicylate Lvl Q000111Q (*)    All other components within normal limits  ACETAMINOPHEN LEVEL - Abnormal; Notable for the following components:   Acetaminophen (Tylenol), Serum <10 (*)    All other components within normal limits  RAPID URINE DRUG SCREEN, HOSP PERFORMED - Abnormal; Notable for the following components:   Cocaine POSITIVE (*)    All other components within normal limits  CBC    EKG None  Radiology No results found.  Procedures Procedures    Medications Ordered in ED Medications  amLODipine (NORVASC) tablet 10 mg (has no administration in time range)  insulin aspart protamine- aspart (NOVOLOG MIX 70/30) injection 20 Units (has no administration in time range)  lisinopril (ZESTRIL) tablet 10 mg (has no administration in time range)  rosuvastatin (CRESTOR) tablet 20 mg (has no administration in time range)    ED Course/ Medical Decision Making/ A&P                             Medical Decision Making Amount and/or Complexity of Data Reviewed Labs: ordered.  Risk Prescription drug management.   This patient presents to the ED for concern of suicidal ideation, this involves an extensive number of treatment options, and is a complaint that carries with it a high risk of complications and morbidity.  The differential diagnosis includes but not limited to depression, polysubstance abuse   Co morbidities that complicate the patient evaluation  Prostate cancer, OSA, cocaine abuse, hypertension,, alcohol abuse   Additional history obtained:  External records from outside source obtained and reviewed including prior labs on file for comparison. Note from urology dated 03/28/2022 notes high-volume mixed  incontinence especially when blood sugars out of control, now with insurance and would like to proceed with urodynamic testing   Lab Tests:  I Ordered, and personally interpreted labs.  The pertinent results include: All and salicylate levels negative.  Alcohol 29.  UDS positive for cocaine.  CBC within normal limits.  CMP with glucose elevated at 303, bicarb 20, gap normal.   Cardiac Monitoring: / EKG:  The patient was maintained on a cardiac monitor.  I personally viewed and interpreted the cardiac monitored which showed an underlying rhythm of: sinus tach, rate 122  Consultations Obtained:  I requested consultation with the behavioral health team,  and discussed lab and imaging findings as well as pertinent plan - they recommend: Consult pending   Problem List / ED Course / Critical interventions / Medication management  57 year old male presents with concern for suicidal ideation with plan to cut wrists secondary to chronic medical ailments.  His blood sugar is elevated, he is noncompliant with his diabetes regimen.  Patient is medically cleared pending behavioral health evaluation and disposition. I ordered medication including home medications for management of chronic underlying conditions Reevaluation of the patient after these medicines showed that the patient stayed the same I have reviewed the patients home medicines and have made adjustments as needed   Social Determinants of Health:  Has PCP   Test / Admission - Considered:  Disposition pending behavioral health evaluation         Final Clinical Impression(s) / ED Diagnoses Final diagnoses:  Suicidal ideation    Rx / DC Orders ED Discharge Orders     None         Tacy Learn, PA-C 04/11/22 Nicholes Calamity, April, MD 04/11/22 [id number]
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
## 2022-04-11 NOTE — Progress Notes (Signed)
Pt was accepted to [facility] 04/11/2022, pending negative covid PCR. Bed assignment: [id number]  Pt meets inpatient criteria per Vesta Mixer, NP  Attending Physician will be Janine Limbo, MD  Report can be called to: - Adult unit: [phone]  Pt can arrive after pending items are received  Care Team Notified: [facility] Lynnda Shields, RN, Vesta Mixer, NP, Pecolia Ades, RN, and Qais Moccasin, NT  [place], [place]  04/11/2022 11:55 AM

## 2022-04-11 NOTE — BHH Group Notes (Signed)
Patient did not attend the Resiliency group.

## 2022-04-11 NOTE — BHH Group Notes (Signed)
[place] Group Notes:  (Nursing/MHT/Case Management/Adjunct)  Date:  04/11/2022  Time:  8:18 PM  Type of Therapy:   AA  Participation Level:  Did Not Attend  Participation Quality:   na  Affect:   na  Cognitive:   na  Insight:  None  Engagement in Group:   na  Modes of Intervention:   na  Summary of Progress/Problems:  Orvan Falconer 04/11/2022, 8:18 PM

## 2022-04-11 NOTE — ED Notes (Signed)
Waiting for lab result for covid  before calling  for transport to [facility]

## 2022-04-11 NOTE — ED Notes (Signed)
Pt dressed out in scrubs and wanded by security at this time

## 2022-04-11 NOTE — ED Provider Notes (Signed)
Emergency Medicine Observation Re-evaluation Note  Jonathan Austin is a 57 y.o. male, seen on rounds today.  Pt initially presented to the ED for complaints of Suicidal Currently, the patient is si with plan to OD and polysubstance abuse.  Physical Exam  BP 131/81 (BP Location: Left Arm)   Pulse 90   Temp 97.6 F (36.4 C) (Oral)   Resp 18   Ht 1.702 m ('5\' 7"'$ )   Wt 104.3 kg   SpO2 97%   BMI 36.01 kg/m  Physical Exam General: nad Cardiac: rrr Lungs: no distress Psych: si  ED Course / MDM  EKG:   I have reviewed the labs performed to date as well as medications administered while in observation.  Recent changes in the last 24 hours include patient has been medically cleared.  He is positive for cocaine and has had hyperglycemia.  He has been treated with insulin and blood sugars have been improving..  Plan  Current plan is for plan is admission to behavioral health hospital..    Pattricia Boss, MD 04/11/22 1614

## 2022-04-12 LAB — GLUCOSE, CAPILLARY
Glucose-Capillary: 211 mg/dL — ABNORMAL HIGH (ref 70–99)
Glucose-Capillary: 198 mg/dL — ABNORMAL HIGH (ref 70–99)
Glucose-Capillary: 299 mg/dL — ABNORMAL HIGH (ref 70–99)
Glucose-Capillary: 475 mg/dL — ABNORMAL HIGH (ref 70–99)
Glucose-Capillary: 180 mg/dL — ABNORMAL HIGH (ref 70–99)

## 2022-04-12 LAB — HEMOGLOBIN A1C
Hgb A1c MFr Bld: 11.8 % — ABNORMAL HIGH (ref 4.8–5.6)
Mean Plasma Glucose: 292 mg/dL

## 2022-04-12 MED ORDER — SERTRALINE HCL 25 MG PO TABS
25.0000 mg | ORAL_TABLET | Freq: Once | ORAL | Status: AC
  Administered 2022-04-12: 25 mg via ORAL
  Filled 2022-04-12 (×2): qty 1

## 2022-04-12 MED ORDER — SERTRALINE HCL 50 MG PO TABS
50.0000 mg | ORAL_TABLET | Freq: Every day | ORAL | Status: DC
  Administered 2022-04-13 – 2022-04-16 (×4): 50 mg via ORAL
  Filled 2022-04-12 (×7): qty 1

## 2022-04-12 NOTE — Progress Notes (Signed)
D- Patient alert and oriented.  Denies SI, HI, AVH. Patient reports neuropathy in his hands and feet.   A-Scheduled medications administered to patient, per MAR. Support and encouragement provided.  Routine safety checks conducted every 15 minutes.  Patient informed to notify staff with problems or concerns.  R- No adverse drug reactions noted. Patient contracts for safety at this time. Patient compliant with medications and treatment plan. Patient receptive, calm, and cooperative. Patient interacts well with others on the unit.  Patient remains safe at this time.

## 2022-04-12 NOTE — Progress Notes (Signed)
   04/12/22 2300  Psych Admission Type (Psych Patients Only)  Admission Status Voluntary  Psychosocial Assessment  Patient Complaints Anxiety;Depression  Eye Contact Brief  Facial Expression Sad  Affect Anxious;Sad  Speech Logical/coherent  Interaction Assertive  Motor Activity Slow  Appearance/Hygiene Unremarkable  Behavior Characteristics Cooperative;Anxious  Mood Anxious;Sad  Thought Process  Coherency WDL  Content WDL  Delusions None reported or observed  Perception WDL  Hallucination None reported or observed  Judgment Poor  Confusion None  Danger to Self  Current suicidal ideation? Denies  Self-Injurious Behavior No self-injurious ideation or behavior indicators observed or expressed   Agreement Not to Harm Self Yes  Description of Agreement verbal  Danger to Others  Danger to Others None reported or observed

## 2022-04-12 NOTE — Progress Notes (Signed)
   04/12/22 0542  15 Minute Checks  Location Bedroom  Visual Appearance Calm  Behavior Sleeping  Sleep (Behavioral Health Patients Only)  Calculate sleep? (Click Yes once per 24 hr at 0600 safety check) Yes  Documented sleep last 24 hours 8.5
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
## 2022-04-12 NOTE — Plan of Care (Signed)
Nurse discussed coping skills with patient.  

## 2022-04-12 NOTE — Group Note (Signed)
Recreation Therapy Group Note   Group Topic:Animal Assisted Therapy   Group Date: 04/12/2022 Start Time: 1430 End Time: 1500 Facilitators: Yiannis Tulloch-McCall, LRT,CTRS Location: [address] Dayroom   Animal-Assisted Activity (AAA) Program Checklist/Progress Notes Patient Eligibility Criteria Checklist & Daily Group note for Rec Tx Intervention  AAA/T Program Assumption of Risk Form signed by Patient/ or Parent Legal Guardian Yes  Patient is free of allergies or severe asthma Yes  Patient reports no fear of animals Yes  Patient reports no history of cruelty to animals Yes  Patient understands his/her participation is voluntary Yes  Patient washes hands before animal contact Yes  Patient washes hands after animal contact Yes   Affect/Mood: Appropriate   Participation Level: Engaged   Participation Quality: Independent   Behavior: Appropriate    Clinical Observations/Individualized Feedback:  Patient attended session and interacted appropriately with therapy dog and peers. Patient asked appropriate questions about therapy dog and his training. Patient shared stories about their pets at home with group.    Plan: Continue to engage patient in RT group sessions 2-3x/week.   Americus Perkey-McCall, LRT,CTRS 04/12/2022 3:43 PM

## 2022-04-12 NOTE — Progress Notes (Signed)
The patient verbalized in group that he spent time today working on his discharge plans. In addition, he mentioned that his wife dropped off his CPAP machine today. His goal for tomorrow is to address his substance abuse and depression.

## 2022-04-12 NOTE — Group Note (Signed)
LCSW Group Therapy Note  Group Date: 04/12/2022 Start Time: 1100 End Time: 1200   Type of Therapy and Topic:  Group Therapy - Healthy vs Unhealthy Coping Skills  Participation Level:  Did Not Attend   Description of Group The focus of this group was to determine what unhealthy coping techniques typically are used by group members and what healthy coping techniques would be helpful in coping with various problems. Patients were guided in becoming aware of the differences between healthy and unhealthy coping techniques. Patients were asked to identify 2-3 healthy coping skills they would like to learn to use more effectively.  Therapeutic Goals Patients learned that coping is what human beings do all day long to deal with various situations in their lives Patients defined and discussed healthy vs unhealthy coping techniques Patients identified their preferred coping techniques and identified whether these were healthy or unhealthy Patients determined 2-3 healthy coping skills they would like to become more familiar with and use more often. Patients provided support and ideas to each other   .   Therapeutic Modalities Cognitive Behavioral Therapy Motivational Interviewing  Windle Guard, LCSW 04/12/2022  3:18 PM

## 2022-04-12 NOTE — Progress Notes (Signed)
D:  Patient's self inventory sheet, patient sleeps good, no sleep medication.  Good appetite, normal energy level, good concentration.  Rated depression, hopeless and anxiety 5.  Denied withdrawals.  Denied SI.  Physical problems, feet, legs, hands.  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  Plans to get treatment and set up MD appointment.   A:  Medications administered per MD orders.  Emotional support and encouragement given patient. R:  Denied SI and HI, contracts for safety.  Denied A/V hallucinations.  Safety maintained with 15 minute checks.

## 2022-04-12 NOTE — Plan of Care (Signed)
  Problem: Education: Goal: Utilization of techniques to improve thought processes will improve Outcome: Progressing Goal: Knowledge of the prescribed therapeutic regimen will improve Outcome: Progressing   Problem: Education: Goal: Knowledge of disease or condition will improve Outcome: Progressing

## 2022-04-12 NOTE — [facility] Suicide Risk Assessment (Signed)
Jonathan Austin INPATIENT:  Family/Significant Other Suicide Prevention Education  Suicide Prevention Education:  Education Completed; 04-12-2022, Jonathan Austin [phone] has been identified by the patient as the family member/significant other with whom the patient will be residing, and identified as the person(Jonathan) who will aid the patient in the event of a mental health crisis (suicidal ideations/suicide attempt).  With written consent from the patient, Jonathan Austin [phone] (Spouse) has been provided the following suicide prevention education, prior to the and/or following the discharge of the patient.  The suicide prevention education provided includes the following: Suicide risk factors Suicide prevention and interventions National Suicide Hotline telephone number [facility] assessment telephone number [facility] and/or Residential Mobile Crisis Unit telephone number  Request made of family/significant other to: Remove weapons (e.g., guns, rifles, knives), all items previously/currently identified as safety concern.   Remove drugs/medications (over-the-counter, prescriptions, illicit drugs), all items previously/currently identified as a safety concern.  Jonathan Austin [phone] verbalizes understanding of the suicide prevention education information provided.  The family member/significant other agrees to remove the items of safety concern listed above.  Jonathan Austin Jonathan Austin Mark 04/12/2022, 2:40 PM
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
## 2022-04-13 ENCOUNTER — Encounter (HOSPITAL_COMMUNITY): Payer: Self-pay

## 2022-04-13 LAB — GLUCOSE, CAPILLARY
Glucose-Capillary: 152 mg/dL — ABNORMAL HIGH (ref 70–99)
Glucose-Capillary: 188 mg/dL — ABNORMAL HIGH (ref 70–99)
Glucose-Capillary: 222 mg/dL — ABNORMAL HIGH (ref 70–99)
Glucose-Capillary: 240 mg/dL — ABNORMAL HIGH (ref 70–99)

## 2022-04-13 LAB — LIPID PANEL
Cholesterol: 206 mg/dL — ABNORMAL HIGH (ref 0–200)
HDL: 43 mg/dL (ref >40)
LDL Cholesterol: 141 mg/dL — ABNORMAL HIGH (ref 0–99)
Total CHOL/HDL Ratio: 4.8 RATIO
Triglycerides: 108 mg/dL (ref <150)
VLDL: 22 mg/dL (ref 0–40)

## 2022-04-13 NOTE — Group Note (Signed)
Date:  04/13/2022 Time:  4:25 PM  Group Topic/Focus:  Healthy Communication:   The focus of this group is to discuss communication, barriers to communication, as well as healthy ways to communicate with others. Making Healthy Choices:   The focus of this group is to help patients identify negative/unhealthy choices they were using prior to admission and identify positive/healthier coping strategies to replace them upon discharge.    Participation Level:  Active  Participation Quality:  Appropriate  Affect:  Appropriate  Cognitive:  Appropriate  Insight: Appropriate  Engagement in Group:  Engaged  Modes of Intervention:  Discussion  Additional Comments:    Garvin Fila 04/13/2022, 4:25 PM

## 2022-04-13 NOTE — Inpatient Diabetes Management (Signed)
Inpatient Diabetes Program Recommendations  AACE/ADA: New Consensus Statement on Inpatient Glycemic Control (2015)  Target Ranges:  Prepandial:   less than 140 mg/dL      Peak postprandial:   less than 180 mg/dL (1-2 hours)      Critically ill patients:  140 - 180 mg/dL   Lab Results  Component Value Date   GLUCAP 152 (H) 04/13/2022   HGBA1C 11.8 (H) 04/11/2022    Review of Glycemic Control  Latest Reference Range & Units 04/12/22 06:02 04/12/22 12:02 04/12/22 17:12 04/12/22 20:32 04/13/22 05:40  Glucose-Capillary 70 - 99 mg/dL 198 (H) 299 (H) 475 (H) 180 (H) 152 (H)   Diabetes history: DM 2 Outpatient Diabetes medications: 70/30 units 20 units bid Current orders for Inpatient glycemic control:  70/30 20 units bid Novolog 0-15 units tid + hs  Inpatient Diabetes Program Recommendations:    -  Increase am dose of 70/30 to 28 units -  Keep 70/30 pm dose at 20 units.  Thanks,  Tama Headings RN, MSN, BC-ADM Inpatient Diabetes Coordinator Team Pager [phone] (8a-5p)

## 2022-04-13 NOTE — Group Note (Signed)
Recreation Therapy Group Note   Group Topic:Leisure Education  Group Date: 04/13/2022 Start Time: 1400 End Time: [id number] Facilitators: Neco Kling-McCall, LRT,CTRS Location: [address] Dayroom   Activity Description/Intervention: Therapeutic Drumming. Patients with peers and staff were given the opportunity to engage in a leader facilitated [place] with staff from the [employer], in partnership with [employer]. Nurse, adult and trained [employer], Devin Going leading with LRT observing and documenting intervention and pt response. This evidenced-based practice targets 7 areas of health and wellbeing in the human experience including: stress-reduction, exercise, self-expression, camaraderie/support, nurturing, spirituality, and music-making (leisure).   Goal Area(s) Addresses:  Patient will engage in pro-social way in music group.  Patient will follow directions of drum leader on the first prompt. Patient will demonstrate no behavioral issues during group.  Patient will identify if a reduction in stress level occurs as a result of participation in therapeutic drum circle.     Affect/Mood: Appropriate   Participation Level: Engaged   Participation Quality: Independent   Behavior: Appropriate   Speech/Thought Process: Focused   Insight: Good   Judgement: Good   Modes of Intervention: Nurse, adult   Patient Response to Interventions:  Engaged   Education Outcome:  Acknowledges education and In group clarification offered    Clinical Observations/Individualized Feedback: Pt was pleasant and engaged during group session.  Pt was attentive and bright throughout group.     Plan: Continue to engage patient in RT group sessions 2-3x/week.   Addilynne Olheiser-McCall, LRT,CTRS  04/13/2022 4:07 PM

## 2022-04-13 NOTE — Progress Notes (Signed)
[place] Group Notes:  (Nursing/MHT/Case Management/Adjunct)  Date:  04/13/2022  Time:  2000  Type of Therapy:   NA Meeting  Participation Level:    Participation Quality:  Appropriate and Attentive  Affect:  Appropriate  Cognitive:  Alert  Insight:  Improving  Engagement in Group:  Engaged  Modes of Intervention:  Education and Support  Summary of Progress/Problems:  Shellia Cleverly 04/13/2022, 9:30 PM

## 2022-04-13 NOTE — BH IP Treatment Plan (Signed)
Interdisciplinary Treatment and Diagnostic Plan Update  04/13/2022 Time of Session: 9:10am  Jonathan Austin MRN: [id number]  Principal Diagnosis: <principal problem not specified>  Secondary Diagnoses: Active Problems:   MDD (major depressive disorder)   Current Medications:  Current Facility-Administered Medications  Medication Dose Route Frequency Provider Last Rate Last Admin   acetaminophen (TYLENOL) tablet 650 mg  650 mg Oral Q6H PRN Vesta Mixer, NP   650 mg at 04/12/22 1506   alum & mag hydroxide-simeth (MAALOX/MYLANTA) 200-200-20 MG/5ML suspension 30 mL  30 mL Oral Q4H PRN Vesta Mixer, NP       amLODipine (NORVASC) tablet 10 mg  10 mg Oral Daily Vesta Mixer, NP   10 mg at 04/13/22 0801   diphenhydrAMINE (BENADRYL) capsule 50 mg  50 mg Oral TID PRN Vesta Mixer, NP       Or   diphenhydrAMINE (BENADRYL) injection 50 mg  50 mg Intramuscular TID PRN Vesta Mixer, NP       haloperidol (HALDOL) tablet 5 mg  5 mg Oral TID PRN Vesta Mixer, NP       Or   haloperidol lactate (HALDOL) injection 5 mg  5 mg Intramuscular TID PRN Vesta Mixer, NP       hydrOXYzine (ATARAX) tablet 25 mg  25 mg Oral TID PRN Vesta Mixer, NP       insulin aspart (novoLOG) injection 0-15 Units  0-15 Units Subcutaneous TID WC Vesta Mixer, NP   3 Units at 04/13/22 1245   insulin aspart (novoLOG) injection 0-5 Units  0-5 Units Subcutaneous QHS Vesta Mixer, NP   2 Units at 04/11/22 2144   insulin aspart protamine- aspart (NOVOLOG MIX 70/30) injection 20 Units  20 Units Subcutaneous BID WC Vesta Mixer, NP   20 Units at 04/13/22 0802   lisinopril (ZESTRIL) tablet 10 mg  10 mg Oral Leonie Man, Wallie Renshaw, NP   10 mg at 04/13/22 1244   LORazepam (ATIVAN) tablet 2 mg  2 mg Oral TID PRN Vesta Mixer, NP       Or   LORazepam (ATIVAN) injection 2 mg  2 mg Intramuscular TID PRN Vesta Mixer, NP       magnesium hydroxide (MILK OF MAGNESIA) suspension 30 mL  30 mL Oral  Daily PRN Vesta Mixer, NP       rosuvastatin (CRESTOR) tablet 20 mg  20 mg Oral Leonie Man, Wallie Renshaw, NP   20 mg at 04/13/22 1244   sertraline (ZOLOFT) tablet 50 mg  50 mg Oral Daily Carrion-Carrero, Caryl Ada, MD   50 mg at 04/13/22 0801   traZODone (DESYREL) tablet 50 mg  50 mg Oral QHS PRN Vesta Mixer, NP       PTA Medications: Medications Prior to Admission  Medication Sig Dispense Refill Last Dose   acetaminophen (TYLENOL) 325 MG tablet Take 650 mg by mouth every 6 (six) hours as needed for moderate pain.      AMBULATORY NON FORMULARY MEDICATION Trimix (30/1/10)-(Pap/Phent/PGE)  Test Dose  23m vial   Qty #3 Refills 0  [facility] [phone] [phone] mL 0    amLODipine (NORVASC) 10 MG tablet Take 1 tablet (10 mg total) by mouth daily. For high blood pressure (Patient not taking: Reported on 04/11/2022) 30 tablet 0    blood glucose meter kit and supplies Dispense based on patient and insurance preference. Use up to four times daily as directed. (FOR ICD-10 E10.9, E11.9). 1 each 0    calcium carbonate (TUMS - DOSED IN MG ELEMENTAL  CALCIUM) 500 MG chewable tablet Chew 1 tablet by mouth as needed for indigestion or heartburn.      doxycycline (VIBRAMYCIN) 100 MG capsule Take 1 capsule (100 mg total) by mouth 2 (two) times daily. (Patient not taking: Reported on 04/11/2022) 14 capsule 0    insulin aspart protamine - aspart (NOVOLOG MIX 70/30 FLEXPEN) (70-30) 100 UNIT/ML FlexPen Inject 20 Units into the skin 2 (two) times daily. 15 mL 11    Insulin Pen Needle 31G X 5 MM MISC 1 Needle by Does not apply route 2 (two) times daily. 100 each 0    lisinopril (ZESTRIL) 10 MG tablet Take 1 tablet (10 mg total) by mouth every morning. (Patient not taking: Reported on 04/11/2022) 30 tablet 1    rosuvastatin (CRESTOR) 20 MG tablet Take 20 mg by mouth every morning. (Patient not taking: Reported on 04/11/2022)       Patient Stressors:    Patient Strengths:    Treatment  Modalities: Medication Management, Group therapy, Case management,  1 to 1 session with clinician, Psychoeducation, Recreational therapy.   Physician Treatment Plan for Primary Diagnosis: <principal problem not specified> Long Term Goal(s):     Short Term Goals:    Medication Management: Evaluate patient's response, side effects, and tolerance of medication regimen.  Therapeutic Interventions: 1 to 1 sessions, Unit Group sessions and Medication administration.  Evaluation of Outcomes: Not Met  Physician Treatment Plan for Secondary Diagnosis: Active Problems:   MDD (major depressive disorder)  Long Term Goal(s):     Short Term Goals:       Medication Management: Evaluate patient's response, side effects, and tolerance of medication regimen.  Therapeutic Interventions: 1 to 1 sessions, Unit Group sessions and Medication administration.  Evaluation of Outcomes: Not Met   RN Treatment Plan for Primary Diagnosis: <principal problem not specified> Long Term Goal(s): Knowledge of disease and therapeutic regimen to maintain health will improve  Short Term Goals: Ability to remain free from injury will improve, Ability to participate in decision making will improve, Ability to verbalize feelings will improve, Ability to disclose and discuss suicidal ideas, and Ability to identify and develop effective coping behaviors will improve  Medication Management: RN will administer medications as ordered by provider, will assess and evaluate patient's response and provide education to patient for prescribed medication. RN will report any adverse and/or side effects to prescribing provider.  Therapeutic Interventions: 1 on 1 counseling sessions, Psychoeducation, Medication administration, Evaluate responses to treatment, Monitor vital signs and CBGs as ordered, Perform/monitor CIWA, COWS, AIMS and Fall Risk screenings as ordered, Perform wound care treatments as ordered.  Evaluation of Outcomes:  Not Met   LCSW Treatment Plan for Primary Diagnosis: <principal problem not specified> Long Term Goal(s): Safe transition to appropriate next level of care at discharge, Engage patient in therapeutic group addressing interpersonal concerns.  Short Term Goals: Engage patient in aftercare planning with referrals and resources, Increase social support, Increase emotional regulation, Facilitate acceptance of mental health diagnosis and concerns, Identify triggers associated with mental health/substance abuse issues, and Increase skills for wellness and recovery  Therapeutic Interventions: Assess for all discharge needs, 1 to 1 time with Social worker, Explore available resources and support systems, Assess for adequacy in community support network, Educate family and significant other(s) on suicide prevention, Complete Psychosocial Assessment, Interpersonal group therapy.  Evaluation of Outcomes: Not Met   Progress in Treatment: Attending groups: Yes. Participating in groups: Yes. Taking medication as prescribed: Yes. Toleration medication: Yes. Family/Significant other contact  made: Yes, individual(s) contacted:  Wife  Patient understands diagnosis: Yes. Discussing patient identified problems/goals with staff: Yes. Medical problems stabilized or resolved: Yes. Denies suicidal/homicidal ideation: Yes. Issues/concerns per patient self-inventory: No.   New problem(s) identified: No, Describe:  None   New Short Term/Long Term Goal(s): medication stabilization, elimination of SI thoughts, development of comprehensive mental wellness plan.   Patient Goals:  "Outpatient for my mental health, medical concerns, and to join NA and AA"  Discharge Plan or Barriers: Patient recently admitted. CSW will continue to follow and assess for appropriate referrals and possible discharge planning.   Reason for Continuation of Hospitalization: Anxiety Depression Medication stabilization Suicidal  ideation Withdrawal symptoms  Estimated Length of Stay: 3 to 7 days   Last [place] Suicide Severity Risk Score: [place] Admission (Current) from 04/11/2022 in [facility] Most recent reading at 04/11/2022  6:44 PM ED from 04/11/2022 in [facility] Department at [facility] Most recent reading at 04/11/2022  9:01 AM ED from 03/03/2022 in [facility] Emergency Department at [facility] Most recent reading at 03/03/2022  1:59 AM  C-SSRS RISK CATEGORY High Risk High Risk No Risk       Last PHQ 2/9 Scores:    08/25/2019    9:18 AM  Depression screen PHQ 2/9  Decreased Interest 2  Down, Depressed, Hopeless 2  PHQ - 2 Score 4  Altered sleeping 2  Tired, decreased energy 2  Change in appetite 3  Feeling bad or failure about yourself  2  Trouble concentrating 1  Moving slowly or fidgety/restless 2  Suicidal thoughts 2  PHQ-9 Score 18  Difficult doing work/chores Very difficult    Scribe for Treatment Team: Darleen Crocker, Latanya Presser 04/13/2022 1:08 PM

## 2022-04-13 NOTE — Group Note (Signed)
Date:  04/13/2022 Time:  9:33 AM  Group Topic/Focus:  Goals Group:   The focus of this group is to help patients establish daily goals to achieve during treatment and discuss how the patient can incorporate goal setting into their daily lives to aide in recovery. Orientation:   The focus of this group is to educate the patient on the purpose and policies of crisis stabilization and provide a format to answer questions about their admission.  The group details unit policies and expectations of patients while admitted.    Participation Level:  Active  Participation Quality:  Appropriate  Affect:  Appropriate  Cognitive:  Appropriate  Insight: Appropriate  Engagement in Group:  Engaged  Modes of Intervention:  Discussion  Additional Comments:  Pt was engaged and appropriate during group  Garvin Fila 04/13/2022, 9:33 AM

## 2022-04-13 NOTE — Progress Notes (Signed)
   04/13/22 2200  Psych Admission Type (Psych Patients Only)  Admission Status Voluntary  Psychosocial Assessment  Patient Complaints Depression  Eye Contact Fair  Facial Expression Animated  Affect Appropriate to circumstance  Speech Logical/coherent  Interaction Assertive  Motor Activity Slow  Appearance/Hygiene Unremarkable  Behavior Characteristics Cooperative;Appropriate to situation  Mood Pleasant  Thought Process  Coherency WDL  Content WDL  Delusions None reported or observed  Perception WDL  Hallucination None reported or observed  Judgment Impaired  Confusion None  Danger to Self  Current suicidal ideation? Denies  Self-Injurious Behavior No self-injurious ideation or behavior indicators observed or expressed   Agreement Not to Harm Self Yes  Description of Agreement verbal  Danger to Others  Danger to Others None reported or observed

## 2022-04-13 NOTE — Progress Notes (Signed)
   04/13/22 0547  15 Minute Checks  Location Bedroom  Visual Appearance Calm  Behavior Sleeping  Sleep (Behavioral Health Patients Only)  Calculate sleep? (Click Yes once per 24 hr at 0600 safety check) Yes  Documented sleep last 24 hours 8
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
## 2022-04-13 NOTE — Progress Notes (Signed)
Pt denied SI/HI/AVH. Pt rates his depression a 5/10, anxiety a 5/10, and feelings of hopelessness a 5/10. Pt has been pleasant, calm, and cooperative throughout the shift.  Pt given scheduled medications as prescribed. Q15 min checks verified for safety. Patient verbally contracts for safety. Patient compliant with medications and treatment plan. Patient is interacting well on the unit. Pt is safe on the unit.   04/13/22 1200  Psych Admission Type (Psych Patients Only)  Admission Status Voluntary  Psychosocial Assessment  Patient Complaints Anxiety;Depression  Eye Contact Brief  Facial Expression Sad  Affect Anxious;Sad  Speech Logical/coherent  Interaction Assertive  Motor Activity Slow  Appearance/Hygiene Unremarkable  Behavior Characteristics Cooperative;Appropriate to situation  Mood Depressed;Anxious  Thought Process  Coherency WDL  Content WDL  Delusions None reported or observed  Perception WDL  Hallucination None reported or observed  Judgment Impaired  Confusion None  Danger to Self  Current suicidal ideation? Denies  Self-Injurious Behavior No self-injurious ideation or behavior indicators observed or expressed   Agreement Not to Harm Self Yes  Description of Agreement Pt verbally contracts for safety  Danger to Others  Danger to Others None reported or observed

## 2022-04-14 LAB — GLUCOSE, CAPILLARY
Glucose-Capillary: 181 mg/dL — ABNORMAL HIGH (ref 70–99)
Glucose-Capillary: 154 mg/dL — ABNORMAL HIGH (ref 70–99)
Glucose-Capillary: 231 mg/dL — ABNORMAL HIGH (ref 70–99)
Glucose-Capillary: 218 mg/dL — ABNORMAL HIGH (ref 70–99)

## 2022-04-14 MED ORDER — INSULIN ASPART PROT & ASPART (70-30 MIX) 100 UNIT/ML ~~LOC~~ SUSP
24.0000 [IU] | Freq: Two times a day (BID) | SUBCUTANEOUS | Status: DC
  Administered 2022-04-14 – 2022-04-15 (×2): 24 [IU] via SUBCUTANEOUS
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
## 2022-04-14 NOTE — Progress Notes (Signed)
   04/14/22 [id number]  15 Minute Checks  Location Bedroom  Visual Appearance Calm  Behavior Composed  Sleep (Behavioral Health Patients Only)  Calculate sleep? (Click Yes once per 24 hr at 0600 safety check) Yes  Documented sleep last 24 hours 6.25

## 2022-04-14 NOTE — [facility] Group Notes (Signed)
Curren attended wrap up group. Talked about his goals and him leaving tomorrow. How to take the steps to continue to do better.

## 2022-04-14 NOTE — BHH Group Notes (Signed)
[place] Group Notes:  (Nursing/MHT/Case Management/Adjunct)  Date:  04/14/2022  Time:  3:39 PM  Type of Therapy:  Group Therapy  Participation Level:  Active  Participation Quality:  Appropriate  Affect:  Appropriate  Cognitive:  Appropriate  Insight:  Good  Engagement in Group:  Engaged  Modes of Intervention:  Discussion  Summary of Progress/Problems:  Jonathan Austin 04/14/2022, 3:39 PM

## 2022-04-14 NOTE — Group Note (Signed)
[facility] LCSW Group Therapy Note   Group Date: 04/14/2022 Start Time: 1100 End Time: 1200  Type of Therapy/Topic:  Group Therapy:  Feelings about Diagnosis  Participation Level:  Active   Mood: Appropriate    Description of Group:    This group will allow patients to explore their thoughts and feelings about diagnoses they have received. Patients will be guided to explore their level of understanding and acceptance of these diagnoses. Facilitator will encourage patients to process their thoughts and feelings about the reactions of others to their diagnosis, and will guide patients in identifying ways to discuss their diagnosis with significant others in their lives. This group will be process-oriented, with patients participating in exploration of their own experiences as well as giving and receiving support and challenge from other group members.   Therapeutic Goals: 1. Patient will demonstrate understanding of diagnosis as evidence by identifying two or more symptoms of the disorder:  2. Patient will be able to express two feelings regarding the diagnosis 3. Patient will demonstrate ability to communicate their needs through discussion and/or role plays  Summary of Patient Progress: pt engaged in group session and provided input.        Therapeutic Modalities:   Cognitive Behavioral Therapy Brief Therapy Feelings Identification    Ayjah Show S Anaysia Germer, LCSW

## 2022-04-14 NOTE — [facility] Group Notes (Signed)
Adult Psychoeducational Group Note  Date:  04/14/2022 Time:  9:35 AM  Group Topic/Focus:  Goals Group:   The focus of this group is to help patients establish daily goals to achieve during treatment and discuss how the patient can incorporate goal setting into their daily lives to aide in recovery.  Participation Level:  Active  Participation Quality:  Appropriate  Affect:  Appropriate  Cognitive:  Appropriate  Insight: Appropriate  Engagement in Group:  Engaged  Modes of Intervention:  Discussion  Additional Comments:  Pt participated throughout group by engaging in discussion.  Victorino December 04/14/2022, 9:35 AM

## 2022-04-14 NOTE — Plan of Care (Signed)
  Problem: Education: Goal: Knowledge of the prescribed therapeutic regimen will improve Outcome: Progressing   Problem: Education: Goal: Knowledge of disease or condition will improve Outcome: Progressing Goal: Understanding of discharge needs will improve Outcome: Progressing   Problem: Education: Goal: Ability to make informed decisions regarding treatment will improve Outcome: Progressing

## 2022-04-14 NOTE — Progress Notes (Signed)
Pt denied SI/HI/AVH this morning. Pt rated his depression a 1/10, anxiety a 1/10, and feelings of hopelessness a 1/10. Pt has been pleasant, calm, and cooperative throughout the shift. RN provided support and encouragement to patient. Pt given scheduled medications as prescribed. Q15 min checks verified for safety. Patient verbally contracts for safety. Patient compliant with medications and treatment plan. Patient is interacting well on the unit. Pt is safe on the unit.   04/14/22 1600  Psych Admission Type (Psych Patients Only)  Admission Status Voluntary  Psychosocial Assessment  Patient Complaints Anxiety;Depression (Pt rates his depression a 1/10 and his anxiety a 1/10)  Eye Contact Fair  Facial Expression Animated  Affect Appropriate to circumstance  Speech Logical/coherent  Interaction Assertive  Motor Activity Slow  Appearance/Hygiene Unremarkable  Behavior Characteristics Cooperative;Appropriate to situation  Mood Pleasant  Thought Process  Coherency WDL  Content WDL  Delusions None reported or observed  Perception WDL  Hallucination None reported or observed  Judgment Impaired  Confusion None  Danger to Self  Current suicidal ideation? Denies  Self-Injurious Behavior No self-injurious ideation or behavior indicators observed or expressed   Agreement Not to Harm Self Yes  Description of Agreement Verbal  Danger to Others  Danger to Others None reported or observed

## 2022-04-15 DIAGNOSIS — F332 Major depressive disorder, recurrent severe without psychotic features: Principal | ICD-10-CM

## 2022-04-15 LAB — GLUCOSE, CAPILLARY
Glucose-Capillary: 102 mg/dL — ABNORMAL HIGH (ref 70–99)
Glucose-Capillary: 147 mg/dL — ABNORMAL HIGH (ref 70–99)
Glucose-Capillary: 262 mg/dL — ABNORMAL HIGH (ref 70–99)
Glucose-Capillary: 229 mg/dL — ABNORMAL HIGH (ref 70–99)

## 2022-04-15 MED ORDER — INSULIN ASPART PROT & ASPART (70-30 MIX) 100 UNIT/ML ~~LOC~~ SUSP
24.0000 [IU] | Freq: Two times a day (BID) | SUBCUTANEOUS | Status: DC
  Administered 2022-04-15 – 2022-04-16 (×2): 24 [IU] via SUBCUTANEOUS

## 2022-04-15 MED ORDER — INSULIN ASPART PROT & ASPART (70-30 MIX) 100 UNIT/ML ~~LOC~~ SUSP: 24.0000 [IU] | Freq: Every day | SUBCUTANEOUS | Status: DC

## 2022-04-15 NOTE — Progress Notes (Signed)
   04/14/22 2130  Psych Admission Type (Psych Patients Only)  Admission Status Voluntary  Psychosocial Assessment  Patient Complaints None  Eye Contact Fair  Facial Expression Animated  Affect Appropriate to circumstance  Speech Logical/coherent  Interaction Assertive  Motor Activity Other (Comment)  Appearance/Hygiene Unremarkable  Behavior Characteristics Cooperative;Appropriate to situation  Mood Pleasant  Thought Process  Coherency WDL  Content WDL  Delusions None reported or observed  Perception WDL  Hallucination None reported or observed  Judgment Impaired  Confusion None  Danger to Self  Current suicidal ideation? Denies  Self-Injurious Behavior No self-injurious ideation or behavior indicators observed or expressed   Agreement Not to Harm Self Yes  Description of Agreement Verbal  Danger to Others  Danger to Others None reported or observed
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
## 2022-04-15 NOTE — Plan of Care (Signed)
  Problem: Education: Goal: Ability to state activities that reduce stress will improve Outcome: Adequate for Discharge   Problem: Education: Goal: Utilization of techniques to improve thought processes will improve Outcome: Adequate for Discharge   Problem: Education: Goal: Knowledge of disease or condition will improve Outcome: Adequate for Discharge Goal: Understanding of discharge needs will improve Outcome: Adequate for Discharge   Problem: Education: Goal: Ability to make informed decisions regarding treatment will improve Outcome: Adequate for Discharge
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
## 2022-04-15 NOTE — Group Note (Signed)
Occupational Therapy Group Note  Group Topic: Sleep Hygiene  Group Date: 04/15/2022 Start Time: 1430 End Time: 1500 Facilitators: Brantley Stage, OT   Group Description: Group encouraged increased participation and engagement through topic focused on sleep hygiene. Patients reflected on the quality of sleep they typically receive and identified areas that need improvement. Group was given background information on sleep and sleep hygiene, including common sleep disorders. Group members also received information on how to improve one's sleep and introduced a sleep diary as a tool that can be utilized to track sleep quality over a length of time. Group session ended with patients identifying one or more strategies they could utilize or implement into their sleep routine in order to improve overall sleep quality.        Therapeutic Goal(s):  Identify one or more strategies to improve overall sleep hygiene  Identify one or more areas of sleep that are negatively impacted (sleep too much, too little, etc)     Participation Level: Engaged   Participation Quality: Independent   Behavior: Appropriate   Speech/Thought Process: Relevant   Affect/Mood: Appropriate   Insight: Fair   Judgement: Fair   Individualization: pt was engaged in their participation of group discussion/activity. New skills were identified  Modes of Intervention: Education  Patient Response to Interventions:  Attentive   Plan: Continue to engage patient in OT groups 2 - 3x/week.  04/15/2022  Brantley Stage, OT Cornell Barman, OT

## 2022-04-15 NOTE — [facility] Group Notes (Signed)
[place] Group Notes:  (Nursing/MHT/Case Management/Adjunct)  Date:  04/15/2022  Time:  8:09 PM  Type of Therapy:   AA  Participation Level:  Active  Participation Quality:  Appropriate  Affect:  Appropriate  Cognitive:  Appropriate  Insight:  Appropriate  Engagement in Group:  Engaged  Modes of Intervention:  Education Attended AA group.  Summary of Progress/Problems:  Jonathan Austin 04/15/2022, 8:09 PM

## 2022-04-15 NOTE — Progress Notes (Signed)
   04/15/22 1113  Psych Admission Type (Psych Patients Only)  Admission Status Voluntary  Psychosocial Assessment  Patient Complaints None  Eye Contact Fair  Facial Expression Animated  Affect Appropriate to circumstance  Speech Logical/coherent  Interaction Assertive  Motor Activity Slow  Appearance/Hygiene Unremarkable  Behavior Characteristics Cooperative;Appropriate to situation  Mood Pleasant  Thought Process  Coherency WDL  Content WDL  Delusions None reported or observed  Perception WDL  Hallucination None reported or observed  Judgment Impaired  Confusion None  Danger to Self  Current suicidal ideation? Denies  Self-Injurious Behavior No self-injurious ideation or behavior indicators observed or expressed   Agreement Not to Harm Self Yes  Description of Agreement verbal  Danger to Others  Danger to Others None reported or observed

## 2022-04-15 NOTE — Group Note (Signed)
Date:  04/15/2022 Time:  12:27 PM  Group Topic/Focus:  Healthy Communication:   The focus of this group is to discuss communication, barriers to communication, as well as healthy ways to communicate with others.    Participation Level:  Active  Participation Quality:  Appropriate  Affect:  Appropriate  Cognitive:  Appropriate  Insight: Appropriate  Engagement in Group:  Engaged  Modes of Intervention:  Discussion  Additional Comments:     Jerrye Beavers 04/15/2022, 12:27 PM

## 2022-04-15 NOTE — Progress Notes (Signed)
   04/15/22 0616  15 Minute Checks  Location Hallway  Visual Appearance Calm  Behavior Composed  Sleep (Behavioral Health Patients Only)  Calculate sleep? (Click Yes once per 24 hr at 0600 safety check) Yes  Documented sleep last 24 hours 7

## 2022-04-15 NOTE — Group Note (Signed)
Date:  04/15/2022 Time:  12:26 PM  Group Topic/Focus:  Orientation:   The focus of this group is to educate the patient on the purpose and policies of crisis stabilization and provide a format to answer questions about their admission.  The group details unit policies and expectations of patients while admitted.    Participation Level:  Active  Participation Quality:  Appropriate  Affect:  Appropriate  Cognitive:  Appropriate  Insight: Appropriate  Engagement in Group:  Engaged  Modes of Intervention:  Discussion  Additional Comments:     Jerrye Beavers 04/15/2022, 12:26 PM

## 2022-04-16 LAB — GLUCOSE, CAPILLARY: Glucose-Capillary: 132 mg/dL — ABNORMAL HIGH (ref 70–99)

## 2022-04-16 MED ORDER — SERTRALINE HCL 50 MG PO TABS: 50.0000 mg | ORAL_TABLET | Freq: Every day | ORAL | 0 refills | Status: DC

## 2022-04-16 NOTE — Progress Notes (Signed)
Patient discharged. Reviewed discharge instructions. Patient verbalized understanding. Patient received all personal belongings including CPAP machine. Patient left unit at 1111.

## 2022-04-16 NOTE — Progress Notes (Signed)
  [facility] Adult Case Management Discharge Plan :  Will you be returning to the same living situation after discharge:  Yes,  Patient will be returning back with his wife  At discharge, do you have transportation home?: Yes,  Patient wife will be picking him up at 11:00 AM  Do you have the ability to pay for your medications: Yes,  Patient has [employer]   Release of information consent forms completed and in the chart;  Patient's signature needed at discharge.  Patient to Follow up at:  Follow-up Information     BEHAVIORAL HEALTH INTENSIVE CHEMICAL DEPENDENCY. Go on 04/19/2022.   Specialty: Behavioral Health Why: You have an appointment on 04/19/22 at 1:00 p.m.  This program meets in-person M, W, F from 9 am to 12 pm and runs for 8 - 12 weeks.  Clinets can also receive individual and family therapy and MAT.  There is weekly drug testing.  The program is abstinence based and AA, NA, Smart Recovery, etc. attendance is encouraged.  For questions, please call # [phone]. Contact information: [place] [phone] [place] [facility]. Go on 05/09/2022.   Specialty: Behavioral Health Why: You have an appointment for therapy services on 05/09/22 at 9:00 am with [facility].  You also have an appointment on  05/19/22 at  9:00 am with Dr. Nelida Gores.   The appointments will be held in person. Contact information: [place] [phone]        Collene Leyden, MD. Go on 04/26/2022.   Specialty: Family Medicine Why: You have an appointment with your primary care provider on 04/26/22 at 10:45 am.   This appointment will be held in person. Contact information: [place] [address] [phone]                 Next level of care provider has access to [place] and Suicide  Prevention discussed: Yes,  Jonathan Austin [phone] (Wife)     Has patient been referred to the Quitline?: N/A patient is not a smoker  Patient has been referred for addiction treatment: Yes. Patient has an appointment with [facility]  Sherre Lain, LCSWA 04/16/2022, 9:56 AM

## 2022-04-16 NOTE — Progress Notes (Signed)
   04/16/22 0820  Psych Admission Type (Psych Patients Only)  Admission Status Voluntary  Psychosocial Assessment  Patient Complaints None  Eye Contact Fair  Facial Expression Animated  Affect Appropriate to circumstance  Speech Logical/coherent  Interaction Assertive  Motor Activity Slow  Appearance/Hygiene Unremarkable  Behavior Characteristics Cooperative;Appropriate to situation  Mood Pleasant  Thought Process  Coherency WDL  Content WDL  Delusions None reported or observed  Perception WDL  Hallucination None reported or observed  Judgment Impaired  Confusion None  Danger to Self  Current suicidal ideation? Denies  Self-Injurious Behavior No self-injurious ideation or behavior indicators observed or expressed   Agreement Not to Harm Self Yes  Description of Agreement verbal  Danger to Others  Danger to Others None reported or observed

## 2022-04-16 NOTE — Progress Notes (Signed)
   04/16/22 0543  15 Minute Checks  Location Bedroom  Visual Appearance Calm  Behavior Composed  Sleep (Behavioral Health Patients Only)  Calculate sleep? (Click Yes once per 24 hr at 0600 safety check) Yes  Documented sleep last 24 hours 6.5

## 2022-04-16 NOTE — Progress Notes (Signed)
   04/15/22 2145  Psych Admission Type (Psych Patients Only)  Admission Status Voluntary  Psychosocial Assessment  Patient Complaints None  Eye Contact Fair  Facial Expression Animated  Affect Appropriate to circumstance  Speech Logical/coherent  Interaction Assertive  Motor Activity Slow  Appearance/Hygiene Unremarkable  Behavior Characteristics Cooperative;Appropriate to situation  Mood Pleasant  Thought Process  Coherency WDL  Content WDL  Delusions None reported or observed  Perception WDL  Hallucination None reported or observed  Judgment Impaired  Confusion None  Danger to Self  Current suicidal ideation? Denies  Self-Injurious Behavior No self-injurious ideation or behavior indicators observed or expressed   Agreement Not to Harm Self Yes  Description of Agreement Verbal  Danger to Others  Danger to Others None reported or observed

## 2022-04-19 ENCOUNTER — Ambulatory Visit (HOSPITAL_COMMUNITY): Payer: Medicaid Other | Admitting: Licensed Clinical Social Worker

## 2022-04-19 ENCOUNTER — Telehealth (HOSPITAL_COMMUNITY): Payer: Self-pay | Admitting: Licensed Clinical Social Worker

## 2022-04-19 NOTE — Telephone Encounter (Signed)
The therapist calls Dyshon as he wants to reschedule his appointment for today at 1 p.m. He cannot come in on 04/21/22 due to another appointment so schedules to meet with this therapist on 04/25/22 at 1 p.m. at the [facility] on the 2nd floor.  Adam Phenix, [place], LCSW, [facility] [place] 04/19/2022

## 2022-04-21 ENCOUNTER — Ambulatory Visit (INDEPENDENT_AMBULATORY_CARE_PROVIDER_SITE_OTHER): Payer: Commercial Managed Care - HMO | Admitting: Podiatry

## 2022-04-21 ENCOUNTER — Encounter: Payer: Self-pay | Admitting: Podiatry

## 2022-04-21 VITALS — BP 130/91 | HR 121

## 2022-04-21 DIAGNOSIS — B351 Tinea unguium: Secondary | ICD-10-CM

## 2022-04-21 DIAGNOSIS — M79674 Pain in right toe(s): Secondary | ICD-10-CM | POA: Diagnosis not present

## 2022-04-21 DIAGNOSIS — M79675 Pain in left toe(s): Secondary | ICD-10-CM | POA: Diagnosis not present

## 2022-04-21 DIAGNOSIS — E1142 Type 2 diabetes mellitus with diabetic polyneuropathy: Secondary | ICD-10-CM

## 2022-04-21 NOTE — Progress Notes (Signed)
  Subjective:  Patient ID: Jonathan Austin, male    DOB: [birth year],  MRN: [id number]  Chief Complaint  Patient presents with   Diabetes    (np) bil foot pain-numbness and swelling   Numbness    57 y.o. male presents with the above complaint. History confirmed with patient.  Nails are thickened and elongated he has pain and tingling and numbness in his feet.  Does not know his A1c [place] is quite high  Objective:  Physical Exam: warm, good capillary refill, no trophic changes or ulcerative lesions, normal DP and PT pulses, and severe polyneuropathy with complete loss of vision sensation in the distal forefoot. Left Foot: dystrophic yellowed discolored nail plates with subungual debris Right Foot: dystrophic yellowed discolored nail plates with subungual debris  Assessment:   1. Pain due to onychomycosis of toenails of both feet   2. Type 2 diabetes mellitus with polyneuropathy ([place])      Plan:  Patient was evaluated and treated and all questions answered.  Patient educated on diabetes. Discussed proper diabetic foot care and discussed risks and complications of disease. Educated patient in depth on reasons to return to the office immediately should he/she discover anything concerning or new on the feet. All questions answered. Discussed proper shoes as well.  We discussed the risk of polyneuropathy and how it relates to possible risk of infection ulceration and complications.  He was to inspect his feet daily.  Hopefully can improve and get his A1c under control  Discussed the etiology and treatment options for the condition in detail with the patient. Educated patient on the topical and oral treatment options for mycotic nails. Recommended debridement of the nails today. Sharp and mechanical debridement performed of all painful and mycotic nails today. Nails debrided in length and thickness using a nail nipper to level of comfort. Discussed treatment options including appropriate shoe  gear. Follow up as needed for painful nails.     Return in about 3 months (around 07/22/2022) for at risk diabetic foot care.

## 2022-04-22 ENCOUNTER — Telehealth: Payer: Self-pay

## 2022-04-22 NOTE — Telephone Encounter (Signed)
Patient advised.

## 2022-04-22 NOTE — Telephone Encounter (Signed)
Patient called stating he has insurance now and would like to get scheduled for Trimix injection again. He asked for medication be sent in. Advised patient I would check with Larene [place] to make sure there is nothing else that is needed ahead of time and will call him back to schedule/advise.

## 2022-04-25 ENCOUNTER — Ambulatory Visit (HOSPITAL_COMMUNITY): Payer: Medicaid Other

## 2022-04-26 ENCOUNTER — Telehealth (HOSPITAL_COMMUNITY): Payer: Self-pay | Admitting: Licensed Clinical Social Worker

## 2022-04-26 ENCOUNTER — Encounter (HOSPITAL_COMMUNITY): Payer: Self-pay | Admitting: Licensed Clinical Social Worker

## 2022-04-26 NOTE — Telephone Encounter (Signed)
The therapist attempts to reach Jonathan Austin leaving a HIPAA-compliant voicemail in relation to missing his appointment with this therapist yesterday.  Adam Phenix, [place], LCSW, [facility], [place] 04/26/2022

## 2022-04-27 ENCOUNTER — Encounter: Payer: Self-pay | Admitting: Urology

## 2022-04-27 ENCOUNTER — Ambulatory Visit: Payer: Medicaid Other | Admitting: Urology

## 2022-04-27 ENCOUNTER — Ambulatory Visit (INDEPENDENT_AMBULATORY_CARE_PROVIDER_SITE_OTHER): Payer: Medicaid Other | Admitting: Urology

## 2022-04-27 VITALS — BP 111/71 | HR 112 | Ht 67.0 in | Wt 230.0 lb

## 2022-04-27 DIAGNOSIS — N529 Male erectile dysfunction, unspecified: Secondary | ICD-10-CM

## 2022-04-27 DIAGNOSIS — N3946 Mixed incontinence: Secondary | ICD-10-CM | POA: Diagnosis not present

## 2022-04-27 DIAGNOSIS — C61 Malignant neoplasm of prostate: Secondary | ICD-10-CM

## 2022-04-27 MED ORDER — NONFORMULARY OR COMPOUNDED ITEM: 0 refills | Status: DC

## 2022-04-27 NOTE — Progress Notes (Signed)
04/27/2022 2:05 PM  Jonathan Austin [birth year] [id number]   Referring provider: Collene Leyden, MD [address] [place],  [place] [address]  Urological history: 1. ED Contributing factors of age, BPH, diabetes, hypertension, depression, polysubstance abuse and hyperlipidemia failed PDE5i's   2.  Prostate cancer High risk prostate cancer diagnosed AUS  2021; PSA 317, 12/12 cores positive Gleason 4+4 adenocarcinoma with extensive extraprostatic extension and perineural invasion; CT suspicious for lymphadenopathy; no distant mets Significant outlet obstruction pretreatment and underwent channel TURP by Dr. Lovena Neighbours 05/2019 Completed IMRT 10/22/2019 Was on ADT however did not get his last injection due to loss of insurance PSA 04/16/2021 was 0.1   3.  Urinary incontinence Both stress and urge incontinence post radiation No improvement on Solifenacin, Myrbetriq Wears depends    4.  Bulbar/prostatic urethral stricture Dilation 06/08/2021  Chief Complaint  Patient presents with   Erectile Dysfunction    HPI: Jonathan Austin is a 57 y.o. male who presents today for follow up.  He is wanting to have a retrial of the Trimix for his erectile dysfunction.  He was last seen by [place] in August 2023 for Trimix titration.  We injected 0.6 of the Trimix (30/1/10) and only experienced penile fullness.  Patient is not having spontaneous erections.  He denies any pain or curvature with erections.    He had lost his insurance and has missed follow up for his PSA monitoring.   He has been evaluated by Dr. Matilde Sprang for his bladder spasms and incontinence.  His next step would be urodynamics.  It was to be conducted at [facility] urology, but unfortunately he states he was told he has a balance due and cannot schedule with them at this time.  Patient denies any modifying or aggravating factors.  Patient denies any gross hematuria, dysuria or suprapubic/flank pain.  Patient denies any  fevers, chills, nausea or vomiting.    I PSS 13/6   IPSS     Row Name 04/27/22 1300         International Prostate Symptom Score   How often have you had the sensation of not emptying your bladder? Not at All     How often have you had to urinate less than every two hours? Almost always     How often have you found you stopped and started again several times when you urinated? Less than half the time     How often have you found it difficult to postpone urination? Less than half the time     How often have you had a weak urinary stream? Not at All     How often have you had to strain to start urination? Not at All     How many times did you typically get up at night to urinate? 4 Times     Total IPSS Score 13       Quality of Life due to urinary symptoms   If you were to spend the rest of your life with your urinary condition just the way it is now how would you feel about that? Terrible              Score:  1-7 Mild 8-19 Moderate 20-35 Severe    Physical Exam:  BP 111/71   Pulse (!) 112   Ht 5\' 7"  (1.702 m)   Wt 230 lb (104.3 kg)   BMI 36.02 kg/m   Constitutional:  Well nourished. Alert and oriented, No acute  distress. HEENT: [name], moist mucus membranes.  Trachea midline Cardiovascular: No clubbing, cyanosis, or edema. Respiratory: Normal respiratory effort, no increased work of breathing. Neurologic: Grossly intact, no focal deficits, moving all 4 extremities. Psychiatric: Normal mood and affect.   Assessment & Plan:    1. ED Will have a retrial of Trimix Script for Trimix sent Return for ICI titration  2. Prostate cancer PSA pending   3. Mixed incontinence Followed by Dr. Matilde Sprang He states that he will be without insurance in the near future and will be unable to undergo UDS/cystoscopy at this time for further evaluation of his incontinence Sent request to [facility] per patient request  Return for ICI titration .  [name]  [place] [address] [name], [name] [address] [phone]

## 2022-04-28 ENCOUNTER — Other Ambulatory Visit: Payer: Self-pay | Admitting: Urology

## 2022-04-28 ENCOUNTER — Telehealth: Payer: Self-pay | Admitting: Family Medicine

## 2022-04-28 DIAGNOSIS — C61 Malignant neoplasm of prostate: Secondary | ICD-10-CM

## 2022-04-28 DIAGNOSIS — R972 Elevated prostate specific antigen [PSA]: Secondary | ICD-10-CM

## 2022-04-28 LAB — PSA: Prostate Specific Ag, Serum: 4.9 ng/mL — ABNORMAL HIGH (ref 0.0–4.0)

## 2022-04-28 NOTE — Telephone Encounter (Signed)
Patient notified and voiced understanding. Appointment has been made.  ?

## 2022-04-28 NOTE — Telephone Encounter (Signed)
-----   Message from Nori Riis, PA-C sent at 04/28/2022 12:12 PM EDT ----- I spoke with Jonathan Austin regarding his recent PSA results.  I explained that this rise of PSA indicates that there is likely a prostate cancer recurrence.  We will need to obtain a CT scan and a PSMA-PET/CT scan at this time for evaluation of possible metastatic disease.  We would then contact him for follow-up appointment to go over those results.   Please call [name] and schedule an appointment to go over the results of the scans.  He will also be picking up his notes sometime today or Friday for his disability case on Monday.

## 2022-05-09 ENCOUNTER — Ambulatory Visit (INDEPENDENT_AMBULATORY_CARE_PROVIDER_SITE_OTHER): Payer: Medicaid Other | Admitting: Licensed Clinical Social Worker

## 2022-05-09 DIAGNOSIS — F1420 Cocaine dependence, uncomplicated: Secondary | ICD-10-CM

## 2022-05-09 DIAGNOSIS — F332 Major depressive disorder, recurrent severe without psychotic features: Secondary | ICD-10-CM

## 2022-05-09 DIAGNOSIS — F1020 Alcohol dependence, uncomplicated: Secondary | ICD-10-CM | POA: Diagnosis not present

## 2022-05-10 ENCOUNTER — Ambulatory Visit
Admission: EM | Admit: 2022-05-10 | Discharge: 2022-05-10 | Disposition: A | Discharge status: Home / Self Care | Payer: Commercial Managed Care - HMO | Attending: Internal Medicine | Admitting: Internal Medicine

## 2022-05-10 ENCOUNTER — Ambulatory Visit
Admission: RE | Admit: 2022-05-10 | Discharge: 2022-05-10 | Disposition: A | Discharge status: Home / Self Care | Payer: Medicaid Other | Source: Ambulatory Visit | Attending: Urology | Admitting: Urology

## 2022-05-10 ENCOUNTER — Encounter (HOSPITAL_COMMUNITY): Payer: Self-pay

## 2022-05-10 DIAGNOSIS — R972 Elevated prostate specific antigen [PSA]: Secondary | ICD-10-CM | POA: Diagnosis present

## 2022-05-10 DIAGNOSIS — H6593 Unspecified nonsuppurative otitis media, bilateral: Secondary | ICD-10-CM | POA: Diagnosis not present

## 2022-05-10 DIAGNOSIS — C61 Malignant neoplasm of prostate: Secondary | ICD-10-CM | POA: Insufficient documentation

## 2022-05-10 MED ORDER — FLUTICASONE PROPIONATE 50 MCG/ACT NA SUSP: 1.0000 | Freq: Every day | NASAL | 0 refills | Status: DC

## 2022-05-10 MED ORDER — IOHEXOL 350 MG/ML SOLN
100.0000 mL | Freq: Once | INTRAVENOUS | Status: AC | PRN
  Administered 2022-05-10: 100 mL via INTRAVENOUS

## 2022-05-10 MED ORDER — CETIRIZINE HCL 10 MG PO TABS: 10.0000 mg | ORAL_TABLET | Freq: Every day | ORAL | 0 refills | Status: DC

## 2022-05-10 NOTE — ED Triage Notes (Signed)
Pt presents with c/o ear fullness and feeling off balance X few months.
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
## 2022-05-10 NOTE — ED Provider Notes (Signed)
[facility]    CSN: [id number] Arrival date & time: 05/10/22  1154      History   Chief Complaint Chief Complaint  Patient presents with   Ear Fullness    HPI Jonathan Austin is a 57 y.o. male.   Patient presents with feelings of ear fullness that has been present for few months.  He denies any associated pain, trauma, drainage from the ears.  He does wear hearing aids at baseline.  He states that he was evaluated by his specialist about a month ago for ear symptoms.  He had his ears washed out at that time due to wax.  Also got new hearing aids recently.  States that these symptoms are persistent.  Also reports that it feels like he has been off balance since symptoms started and is attributing the symptoms to ears.  Patient not reporting chest pain, shortness of breath, headache, blurred vision, nausea, vomiting.   Ear Fullness    Past Medical History:  Diagnosis Date   Alcohol use disorder, severe, dependence    05-24-2019 recent [facility] admission for suicide ideation and alcohol / cocaine detox 05-04-2019 in epic,  pt stated has been taking naltrexone daily as prescribed since and no alcohol since   Cocaine abuse    05-24-2019  pt had recent [facility] admission, pt stated last used  "crack" 05-03-2019   Cocaine abuse with cocaine-induced mood disorder    Depression    GERD (gastroesophageal reflux disease)    History of suicidal ideation    multiple [facility] admission's;  last one admitted 05-04-2019   Hyperplasia of prostate with lower urinary tract symptoms (LUTS)    Hypertension    followed by pcp   OSA on CPAP    uses intermittently   Phimosis    Prostate cancer urologist-- dr winter   dx 02/ 2021;  localized advanced, gleason 4+4, PSA 317   Renal insufficiency    Sinus tachycardia    Thoracic aortic atherosclerosis    Type 2 diabetes mellitus treated with insulin    followed by pcp   (05-24-2019  per stated checks blood sugar twice daily, fasting sugar-- 180-200)    Wears glasses     Patient Active Problem List   Diagnosis Date Noted   MDD (major depressive disorder) 04/11/2022   Depression 09/24/2019   Malignant neoplasm of prostate 04/30/2019   Polysubstance dependence including opioid drug with daily use 03/16/2017   Cocaine use disorder, severe, dependence 07/19/2016   Alcohol use disorder, severe, dependence 07/16/2016   Thoracic aortic atherosclerosis 05/09/2016   Major depressive disorder, recurrent severe without psychotic features 02/21/2016   Renal insufficiency 01/20/2016   HTN (hypertension) 09/20/2015   HLD (hyperlipidemia) 09/20/2015   Sinus tachycardia 02/12/2014   Marijuana abuse 06/28/2013   Type 2 diabetes mellitus with polyneuropathy 12/09/2012    Past Surgical History:  Procedure Laterality Date   CIRCUMCISION N/A 05/29/2019   Procedure: CIRCUMCISION ADULT;  Surgeon: Ceasar Mons, MD;  Location[facility];  Service: Urology;  Laterality: N/A;   CYSTOSCOPY WITH URETHRAL DILATATION N/A 06/08/2021   Procedure: CYSTOSCOPY WITH URETHRAL DILATATION;  Surgeon: Abbie Sons, MD;  Location: [facility];  Service: Urology;  Laterality: N/A;   PROSTATE BIOPSY     TONSILLECTOMY  child   TRANSURETHRAL RESECTION OF PROSTATE N/A 05/29/2019   Procedure: TRANSURETHRAL RESECTION OF THE PROSTATE (TURP)/ CYSTOSCOPY;  Surgeon: Ceasar Mons, MD;  Location: [facility];  Service: Urology;  Laterality: N/A;       Home Medications    Prior to Admission medications   Medication Sig Start Date End Date Taking? Authorizing Provider  cetirizine (ZYRTEC) 10 MG tablet Take 1 tablet (10 mg total) by mouth daily. 05/10/22  Yes , Hildred Alamin E, FNP  fluticasone (FLONASE) 50 MCG/ACT nasal spray Place 1 spray into both nostrils daily. 05/10/22  Yes , Hildred Alamin E, FNP  acetaminophen (TYLENOL) 325 MG tablet Take 650 mg by mouth every 6 (six) hours as needed for moderate pain.    [provider]  AMBULATORY NON FORMULARY MEDICATION Trimix (30/1/10)-(Pap/Phent/PGE)  Test Dose  3ml vial   Qty #3 [place] [phone] Fax [phone] 09/06/21   Zara Council A, PA-C  amLODipine (NORVASC) 10 MG tablet Take 1 tablet (10 mg total) by mouth daily. For high blood pressure 01/27/18   Lindell Spar I, NP  blood glucose meter kit and supplies Dispense based on patient and insurance preference. Use up to four times daily as directed. (FOR ICD-10 E10.9, E11.9). 03/03/22   Davonna Belling, MD  calcium carbonate (TUMS - DOSED IN MG ELEMENTAL CALCIUM) 500 MG chewable tablet Chew 1 tablet by mouth as needed for indigestion or heartburn.    [provider]  insulin aspart protamine - aspart (NOVOLOG MIX 70/30 FLEXPEN) (70-30) 100 UNIT/ML FlexPen Inject 20 Units into the skin 2 (two) times daily. 03/03/22   Davonna Belling, MD  Insulin Pen Needle 31G X 5 MM MISC 1 Needle by Does not apply route 2 (two) times daily. 03/03/22   Davonna Belling, MD  lisinopril (ZESTRIL) 10 MG tablet Take 1 tablet (10 mg total) by mouth every morning. 03/03/22   Davonna Belling, MD  NONFORMULARY OR COMPOUNDED ITEM Trimix (30/1/10)-(Pap/Phent/PGE)  Test Dose  52ml vial   Qty #3 [place] [phone] Fax [phone] 04/27/22   Zara Council A, PA-C  rosuvastatin (CRESTOR) 20 MG tablet Take 20 mg by mouth every morning. 04/01/21   [provider]  sertraline (ZOLOFT) 50 MG tablet Take 1 tablet (50 mg total) by mouth daily. 04/17/22   Corky Sox, MD    Family History Family History  Problem Relation Age of Onset   Hypertension Mother    Hypertension Father    Diabetes Father    Prostate cancer Maternal Grandfather 96   Breast cancer Neg Hx    Colon cancer Neg Hx    Pancreatic cancer Neg Hx     Social History Social History   Tobacco Use   Smoking status: Never    Passive exposure: Never   Smokeless tobacco: Never  Vaping Use    Vaping Use: Never used  Substance Use Topics   Alcohol use: Yes    Alcohol/week: 2.0 standard drinks of alcohol    Types: 2 Cans of beer per week    Comment: drinks beer 40 oz x 2 daily  (05-24-2019 per pt no alcohol since 05-03-2019)   Drug use: Yes    Types: Cocaine, Marijuana    Comment: smokes crack cocaine     Allergies   Patient has no known allergies.   Review of Systems Review of Systems Per HPI  Physical Exam Triage Vital Signs ED Triage Vitals  Enc Vitals Group     BP 05/10/22 1224 (!) 146/89     Pulse Rate 05/10/22 1224 (!) 106     Resp 05/10/22 1224 16     Temp 05/10/22 1224 98.1 F (  36.7 C)     Temp Source 05/10/22 1224 Oral     SpO2 05/10/22 1224 96 %     Weight --      Height --      Head Circumference --      Peak Flow --      Pain Score 05/10/22 1223 0     Pain Loc --      Pain Edu? --      Excl. in [place]? --    No data found.  Updated Vital Signs BP (!) 146/89 (BP Location: Right Arm)   Pulse (!) 106   Temp 98.1 F (36.7 C) (Oral)   Resp 16   SpO2 96%   Visual Acuity Right Eye Distance:   Left Eye Distance:   Bilateral Distance:    Right Eye Near:   Left Eye Near:    Bilateral Near:     Physical Exam Constitutional:      General: He is not in acute distress.    Appearance: Normal appearance. He is not toxic-appearing or diaphoretic.  HENT:     Head: Normocephalic and atraumatic.     Right Ear: No drainage, swelling or tenderness. A middle ear effusion is present. There is no impacted cerumen. Tympanic membrane is not perforated, erythematous or bulging.     Left Ear: No drainage, swelling or tenderness. A middle ear effusion is present. There is no impacted cerumen. Tympanic membrane is not perforated, erythematous or bulging.  Eyes:     Extraocular Movements: Extraocular movements intact.     Conjunctiva/sclera: Conjunctivae normal.  Pulmonary:     Effort: Pulmonary effort is normal.  Neurological:     General: No focal  deficit present.     Mental Status: He is alert and oriented to person, place, and time. Mental status is at baseline.     Cranial Nerves: Cranial nerves 2-12 are intact.     Sensory: Sensation is intact.     Motor: Motor function is intact.     Coordination: Coordination is intact.     Gait: Gait is intact.  Psychiatric:        Mood and Affect: Mood normal.        Behavior: Behavior normal.        Thought Content: Thought content normal.        Judgment: Judgment normal.      UC Treatments / Results  Labs (all labs ordered are listed, but only abnormal results are displayed) Labs Reviewed - No data to display  EKG   Radiology No results found.  Procedures Procedures (including critical care time)  Medications Ordered in UC Medications - No data to display  Initial Impression / Assessment and Plan / UC Course  I have reviewed the triage vital signs and the nursing notes.  Pertinent labs & imaging results that were available during my care of the patient were reviewed by me and considered in my medical decision making (see chart for details).     Patient has fluid behind TMs but no impacted cerumen.  Fluid is most likely attributing to feelings of ear fullness.  This is most likely also contributing to feeling "off balance".  Neuroexam is normal so do not think any further workup for dizziness/feeling "off balance" is necessary given this is mot likely related to ears.  Will treat with antihistamine and Flonase as patient denies that he takes these daily and that should be safe with daily medications.  Heart rate appears  baseline so no further workup for this is necessary.  Advised patient to follow-up if any symptoms persist or worsen.  Patient verbalized understanding and was agreeable with plan. Final Clinical Impressions(s) / UC Diagnoses   Final diagnoses:  Fluid level behind tympanic membrane of both ears     Discharge Instructions      It appears that you have  fluid behind your eardrums causing discomfort.  I have prescribed you 2 medications to alleviate symptoms.  Follow-up if any symptoms persist or worsen.    ED Prescriptions     Medication Sig Dispense Auth. Provider   cetirizine (ZYRTEC) 10 MG tablet Take 1 tablet (10 mg total) by mouth daily. 30 tablet [place], [place] E, [place]   fluticasone [facility]) 50 MCG/ACT nasal spray Place 1 spray into both nostrils daily. 16 g Teodora Medici, [place]      PDMP not reviewed this encounter.   Teodora Medici, [place] 05/10/22 [phone]

## 2022-05-10 NOTE — Discharge Instructions (Addendum)
It appears that you have fluid behind your eardrums causing discomfort.  I have prescribed you 2 medications to alleviate symptoms.  Follow-up if any symptoms persist or worsen.

## 2022-05-12 ENCOUNTER — Ambulatory Visit: Payer: Commercial Managed Care - HMO

## 2022-05-16 NOTE — Progress Notes (Deleted)
   05/16/2022 8:16 AM  Jonathan Austin 03-03-1965 [id number]   Referring provider: Irven Coe, MD [address] [place],  [place] [address]   No chief complaint on file.   HPI: Jonathan Austin is a 57 y.o. male who presents today for Trimix titration.    The procedure is discussed with patient.  He is allowed to ask questions.  Questions were answered to his satisfaction.  We were able to proceed to the titration.  Physical Exam:  There were no vitals taken for this visit.  Constitutional:  Well nourished. Alert and oriented, No acute distress. GU: No CVA tenderness.  No bladder fullness or masses.  Patient with circumcised/uncircumcised phallus. ***Foreskin easily retracted***  Urethral meatus is patent.  No penile discharge. No penile lesions or rashes. Scrotum without lesions, cysts, rashes and/or edema.  Psychiatric: Normal mood and affect.   Procedure *** Patient's left corpus cavernosum is identified.  An area near the base of the penis is cleansed with rubbing alcohol.  Careful to avoid the dorsal vein, *** mcg of Trimix (papaverine 30 mg, phentolamine 1 mg and prostaglandin E1 10 mcg, Lot # ***@*** exp # *** is injected at a 90 degree angle into the left *** corpus cavernosum near the base of the penis.  Patient experienced a very firm erection in 15 minutes.     Assessment & Plan:    1. Erectile dysfunction ***  2. Prostate cancer -He has a history of high risk prostate cancer and his recent PSA was found to be 4.9 on April 27, 2022 -Contrast CT dated May 10, 2022 did not identify any prostate metastases -PSMA CT/PET scan is still pending  3.  Mixed incontinence -He has been referred to [facility] for urodynamic study    No follow-ups on file.  Oris Drone [facility]  [facility] [address] [place], [place] [address] [phone]

## 2022-05-17 ENCOUNTER — Ambulatory Visit: Payer: Medicaid Other | Admitting: Urology

## 2022-05-17 DIAGNOSIS — N529 Male erectile dysfunction, unspecified: Secondary | ICD-10-CM

## 2022-05-19 ENCOUNTER — Encounter (HOSPITAL_COMMUNITY): Payer: Medicaid Other | Admitting: Psychiatry

## 2022-05-19 ENCOUNTER — Encounter (HOSPITAL_COMMUNITY): Payer: Self-pay

## 2022-05-19 ENCOUNTER — Ambulatory Visit
Admission: RE | Admit: 2022-05-19 | Discharge: 2022-05-19 | Disposition: A | Discharge status: Home / Self Care | Payer: Medicaid Other | Source: Ambulatory Visit | Attending: Urology | Admitting: Urology

## 2022-05-19 DIAGNOSIS — I2510 Atherosclerotic heart disease of native coronary artery without angina pectoris: Secondary | ICD-10-CM | POA: Diagnosis not present

## 2022-05-19 DIAGNOSIS — I700 Atherosclerosis of aorta: Secondary | ICD-10-CM | POA: Diagnosis not present

## 2022-05-19 DIAGNOSIS — R590 Localized enlarged lymph nodes: Secondary | ICD-10-CM | POA: Diagnosis not present

## 2022-05-19 DIAGNOSIS — C61 Malignant neoplasm of prostate: Secondary | ICD-10-CM | POA: Diagnosis not present

## 2022-05-19 DIAGNOSIS — R972 Elevated prostate specific antigen [PSA]: Secondary | ICD-10-CM

## 2022-05-19 DIAGNOSIS — R9721 Rising PSA following treatment for malignant neoplasm of prostate: Secondary | ICD-10-CM | POA: Diagnosis present

## 2022-05-19 DIAGNOSIS — D3501 Benign neoplasm of right adrenal gland: Secondary | ICD-10-CM | POA: Insufficient documentation

## 2022-05-19 MED ORDER — PIFLIFOLASTAT F 18 (PYLARIFY) INJECTION
9.0000 | Freq: Once | INTRAVENOUS | Status: AC
  Administered 2022-05-19: 9.66 via INTRAVENOUS

## 2022-05-19 NOTE — Progress Notes (Signed)
This encounter was created in error - please disregard.

## 2022-05-31 ENCOUNTER — Ambulatory Visit (INDEPENDENT_AMBULATORY_CARE_PROVIDER_SITE_OTHER): Payer: Medicaid Other | Admitting: Licensed Clinical Social Worker

## 2022-05-31 DIAGNOSIS — Z91199 Patient's noncompliance with other medical treatment and regimen due to unspecified reason: Secondary | ICD-10-CM

## 2022-05-31 NOTE — Progress Notes (Signed)
LCSW counselor attempted to connect with patient for scheduled appointment via MyChart video text request x 2 and email request with no response.   Attempt 1: Text and email: 3:03p  Attempt 2: Text and email: 3:06p  Attempt 3: Text and email: 3:13p  Video session was closed at : 3:17p  Per [place] policy, after multiple attempts to reach pt unsuccessfully at appointed time--visit will be coded as no show

## 2022-06-02 NOTE — Progress Notes (Deleted)
   06/02/2022 9:39 AM  [name] 1965-09-18 [id number]   Referring provider: Irven Coe, MD [address] [place],  [place] [address]  Urological history: 1. ED Contributing factors of age, BPH, diabetes, hypertension, depression, polysubstance abuse and hyperlipidemia failed PDE5i's   2.  Prostate cancer PSA (04/2022) 4.9  High risk prostate cancer diagnosed [facility] 2021; PSA 317, 12/12 cores positive Gleason 4+4 adenocarcinoma with extensive extraprostatic extension and perineural invasion; CT suspicious for lymphadenopathy; no distant mets Significant outlet obstruction pretreatment and underwent channel TURP by Dr. Liliane Shi 05/2019 Completed IMRT 10/22/2019 Was on ADT however did not get his last injection due to loss of insurance   3.  Urinary incontinence Both stress and urge incontinence post radiation No improvement on Solifenacin, Myrbetriq Wears depends    4.  Bulbar/prostatic urethral stricture Dilation 06/08/2021  No chief complaint on file.   HPI: Jonathan Austin is a 57 y.o. male who presents today for to discuss his CT and PET scan results.    Contrast CT completed on 05/10/2022 did not identify any metastatic disease.  PSMA CT-/PET completed on 05/19/2022 did reveal likely metastases to the abdominal retroperitoneal lymph nodes with radiotracer accumulation of the posterior prostatic apex which would be consistent with recurrent prostate carcinoma.  His recent UDS performed at [facility] and not [facility] for insurance purposes revealed SUI.     Physical Exam:  There were no vitals taken for this visit.  Constitutional:  Well nourished. Alert and oriented, No acute distress. HEENT: [place] AT, moist mucus membranes.  Trachea midline Cardiovascular: No clubbing, cyanosis, or edema. Respiratory: Normal respiratory effort, no increased work of breathing. GU: No CVA tenderness.  No bladder fullness or masses.  Patient with  circumcised/uncircumcised phallus. ***Foreskin easily retracted***  Urethral meatus is patent.  No penile discharge. No penile lesions or rashes. Scrotum without lesions, cysts, rashes and/or edema.  Testicles are located scrotally bilaterally. No masses are appreciated in the testicles. Left and right epididymis are normal. Rectal: Patient with  normal sphincter tone. Anus and perineum without scarring or rashes. No rectal masses are appreciated. Prostate is approximately *** grams, *** nodules are appreciated. Seminal vesicles are normal. Neurologic: Grossly intact, no focal deficits, moving all 4 extremities. Psychiatric: Normal mood and affect.   Assessment & Plan:    1. Prostate cancer High risk prostate cancer now with metastatic disease PSA 4.9 Lymph node metastases identified on PET scan Refer to cancer center  2. ED Will have a retrial of Trimix Script for Trimix sent Return for ICI titration  3. Mixed incontinence Followed by Dr. Sherron Monday He states that he will be without insurance in the near future and will be unable to undergo UDS/cystoscopy at this time for further evaluation of his incontinence Sent request to [facility] per patient request  No follow-ups on file.  [name]  [facility] [address] [place], [place] [address] [phone]

## 2022-06-03 ENCOUNTER — Ambulatory Visit: Payer: Medicaid Other | Admitting: Urology

## 2022-06-03 DIAGNOSIS — C61 Malignant neoplasm of prostate: Secondary | ICD-10-CM

## 2022-06-03 DIAGNOSIS — N529 Male erectile dysfunction, unspecified: Secondary | ICD-10-CM

## 2022-06-03 DIAGNOSIS — N3946 Mixed incontinence: Secondary | ICD-10-CM

## 2022-06-12 ENCOUNTER — Telehealth: Payer: Self-pay | Admitting: Urology

## 2022-06-12 NOTE — Telephone Encounter (Signed)
Please have Jonathan Austin reschedule his missed appointment from 06/03/2022.  That appointment was to go over the results of his scans.  His scans were abnormal and we need to discuss his next step.

## 2022-06-13 NOTE — Telephone Encounter (Signed)
Appointment made

## 2022-06-16 NOTE — Progress Notes (Deleted)
   06/16/2022 9:42 PM  Jonathan Austin [birth year] [id number]   Referring provider: Irven Coe, MD [address] [place],  [place] [address]  Urological history: 1. ED Contributing factors of age, BPH, diabetes, hypertension, depression, polysubstance abuse and hyperlipidemia failed PDE5i's   2.  Prostate cancer PSA (04/2022) 4.9  High risk prostate cancer diagnosed [facility] 2021; PSA 317, 12/12 cores positive Gleason 4+4 adenocarcinoma with extensive extraprostatic extension and perineural invasion; CT suspicious for lymphadenopathy; no distant mets Significant outlet obstruction pretreatment and underwent channel TURP by Dr. Liliane Shi 05/2019 Completed IMRT 10/22/2019 Was on ADT however did not get his last injection due to loss of insurance   3.  Urinary incontinence Both stress and urge incontinence post radiation No improvement on Solifenacin, Myrbetriq Wears depends    4.  Bulbar/prostatic urethral stricture Dilation 06/08/2021  No chief complaint on file.   HPI: Jonathan Austin is a 57 y.o. male who presents today for to discuss his CT and PET scan results.    Contrast CT completed on 05/10/2022 did not identify any metastatic disease.  PSMA CT-/PET completed on 05/19/2022 did reveal likely metastases to the abdominal retroperitoneal lymph nodes with radiotracer accumulation of the posterior prostatic apex which would be consistent with recurrent prostate carcinoma.  His recent UDS performed at [facility] and not [facility] for insurance purposes revealed SUI.     Physical Exam:  There were no vitals taken for this visit.  Constitutional:  Well nourished. Alert and oriented, No acute distress. HEENT: [place] AT, moist mucus membranes.  Trachea midline Cardiovascular: No clubbing, cyanosis, or edema. Respiratory: Normal respiratory effort, no increased work of breathing. GU: No CVA tenderness.  No bladder fullness or masses.  Patient with  circumcised/uncircumcised phallus. ***Foreskin easily retracted***  Urethral meatus is patent.  No penile discharge. No penile lesions or rashes. Scrotum without lesions, cysts, rashes and/or edema.  Testicles are located scrotally bilaterally. No masses are appreciated in the testicles. Left and right epididymis are normal. Rectal: Patient with  normal sphincter tone. Anus and perineum without scarring or rashes. No rectal masses are appreciated. Prostate is approximately *** grams, *** nodules are appreciated. Seminal vesicles are normal. Neurologic: Grossly intact, no focal deficits, moving all 4 extremities. Psychiatric: Normal mood and affect.   Assessment & Plan:    1. Prostate cancer High risk prostate cancer now with metastatic disease PSA 4.9 Lymph node metastases identified on PET scan Refer to cancer center  2. ED Will have a retrial of Trimix Script for Trimix sent Return for ICI titration  3. Mixed incontinence Followed by Dr. Sherron Monday Follow up with Dr. Sherron Monday to review UDS results  No follow-ups on file.  [name]  [facility] [address] [place], [place] [address] [phone]

## 2022-06-17 ENCOUNTER — Ambulatory Visit: Payer: Medicaid Other | Admitting: Urology

## 2022-06-28 NOTE — Progress Notes (Signed)
   07/01/2022 11:00 AM  Jonathan Austin Apr 06, 1965 [id number]   Referring provider: Irven Coe, MD [address] [place],  [place] [address]  Urological history: 1. ED Contributing factors of age, BPH, diabetes, hypertension, depression, polysubstance abuse and hyperlipidemia failed PDE5i's   2.  Prostate cancer PSA (04/2022) 4.9  High risk prostate cancer diagnosed [facility] 2021; PSA 317, 12/12 cores positive Gleason 4+4 adenocarcinoma with extensive extraprostatic extension and perineural invasion; CT suspicious for lymphadenopathy; no distant mets Significant outlet obstruction pretreatment and underwent channel TURP by Dr. Liliane Austin 05/2019 Completed IMRT 10/22/2019 Was on ADT however did not get his last injection due to loss of insurance   3.  Urinary incontinence Both stress and urge incontinence post radiation No improvement on Solifenacin, Myrbetriq Wears depends    4.  Bulbar/prostatic urethral stricture Dilation 06/08/2021  Chief Complaint  Patient presents with   Results    HPI: Jonathan Austin is a 57 y.o. male who presents today for to discuss his CT and PET scan results with his wife, Jonathan Austin.   Contrast CT completed on 05/10/2022 did not identify any metastatic disease.  PSMA CT-/PET completed on 05/19/2022 did reveal likely metastases to the abdominal retroperitoneal lymph nodes with radiotracer accumulation of the posterior prostatic apex which would be consistent with recurrent prostate carcinoma.  His recent UDS performed at [facility] and not [facility] for insurance purposes revealed SUI.    Physical Exam:  BP 127/72 (BP Location: Left Arm, Patient Position: Sitting, Cuff Size: Large)   Pulse (!) 112   Ht 5\' 7"  (1.702 m)   Wt 221 lb 12.8 oz (100.6 kg)   BMI 34.74 kg/m   Constitutional:  Well nourished. Alert and oriented, No acute distress. HEENT: [place] AT, moist mucus membranes.  Trachea midline Cardiovascular: No clubbing, cyanosis, or  edema. Respiratory: Normal respiratory effort, no increased work of breathing. Neurologic: Grossly intact, no focal deficits, moving all 4 extremities. Psychiatric: Normal mood and affect.   Assessment & Plan:    1. Prostate cancer High risk prostate cancer now with metastatic disease PSA 4.9 Abdominal retroperitoneal lymph node metastases identified on PET scan Discussed CT and PET scan findings with the patient and his wife and recommend that we refer him to the cancer center for further evaluation and treatment plan and they are agreeable  2. ED Tabled at this time  3. Mixed incontinence Followed by Jonathan Austin This is very bothersome to the patient, will have him follow up with Jonathan Austin once he has been evaluated by the cancer center and treatment regimen has been decided   Return for referral to cancer center for metastatic prostate cancer .  Jonathan Austin  [facility] [address] [place], [place] [address] [phone]

## 2022-06-29 ENCOUNTER — Ambulatory Visit (INDEPENDENT_AMBULATORY_CARE_PROVIDER_SITE_OTHER): Payer: Medicaid Other | Admitting: Urology

## 2022-06-29 ENCOUNTER — Encounter: Payer: Self-pay | Admitting: Urology

## 2022-06-29 VITALS — BP 127/72 | HR 112 | Ht 67.0 in | Wt 221.8 lb

## 2022-06-29 DIAGNOSIS — C61 Malignant neoplasm of prostate: Secondary | ICD-10-CM | POA: Diagnosis not present

## 2022-06-29 DIAGNOSIS — N529 Male erectile dysfunction, unspecified: Secondary | ICD-10-CM

## 2022-06-29 DIAGNOSIS — C772 Secondary and unspecified malignant neoplasm of intra-abdominal lymph nodes: Secondary | ICD-10-CM

## 2022-06-29 DIAGNOSIS — N3946 Mixed incontinence: Secondary | ICD-10-CM

## 2022-07-07 ENCOUNTER — Telehealth: Payer: Self-pay | Admitting: Hematology and Oncology

## 2022-07-07 NOTE — Telephone Encounter (Signed)
Patient called for update on his appointment. Left a voicemail in regards to appointment times and dates.

## 2022-07-22 ENCOUNTER — Encounter: Payer: Self-pay | Admitting: Podiatry

## 2022-07-28 ENCOUNTER — Ambulatory Visit: Payer: Commercial Managed Care - HMO | Admitting: Podiatry

## 2022-07-29 ENCOUNTER — Ambulatory Visit: Payer: Commercial Managed Care - HMO | Admitting: Podiatry

## 2022-08-01 ENCOUNTER — Other Ambulatory Visit (HOSPITAL_COMMUNITY): Payer: Self-pay

## 2022-08-01 ENCOUNTER — Encounter: Payer: Self-pay | Admitting: Hematology and Oncology

## 2022-08-01 ENCOUNTER — Telehealth: Payer: Self-pay | Admitting: *Deleted

## 2022-08-01 ENCOUNTER — Other Ambulatory Visit: Payer: Self-pay

## 2022-08-01 ENCOUNTER — Telehealth: Payer: Self-pay | Admitting: Pharmacy Technician

## 2022-08-01 ENCOUNTER — Inpatient Hospital Stay: Payer: Medicaid Other

## 2022-08-01 ENCOUNTER — Telehealth: Payer: Self-pay | Admitting: Pharmacist

## 2022-08-01 ENCOUNTER — Telehealth: Payer: Self-pay

## 2022-08-01 ENCOUNTER — Inpatient Hospital Stay: Payer: Medicaid Other | Attending: Hematology and Oncology | Admitting: Hematology and Oncology

## 2022-08-01 VITALS — BP 121/80 | HR 108 | Temp 97.7°F | Resp 17 | Wt 219.2 lb

## 2022-08-01 DIAGNOSIS — F1410 Cocaine abuse, uncomplicated: Secondary | ICD-10-CM | POA: Diagnosis not present

## 2022-08-01 DIAGNOSIS — Z923 Personal history of irradiation: Secondary | ICD-10-CM | POA: Insufficient documentation

## 2022-08-01 DIAGNOSIS — R3981 Functional urinary incontinence: Secondary | ICD-10-CM | POA: Diagnosis not present

## 2022-08-01 DIAGNOSIS — Z7952 Long term (current) use of systemic steroids: Secondary | ICD-10-CM | POA: Insufficient documentation

## 2022-08-01 DIAGNOSIS — Z8042 Family history of malignant neoplasm of prostate: Secondary | ICD-10-CM | POA: Diagnosis not present

## 2022-08-01 DIAGNOSIS — F1090 Alcohol use, unspecified, uncomplicated: Secondary | ICD-10-CM | POA: Diagnosis not present

## 2022-08-01 DIAGNOSIS — C779 Secondary and unspecified malignant neoplasm of lymph node, unspecified: Secondary | ICD-10-CM | POA: Insufficient documentation

## 2022-08-01 DIAGNOSIS — C61 Malignant neoplasm of prostate: Secondary | ICD-10-CM | POA: Diagnosis present

## 2022-08-01 LAB — CBC WITH DIFFERENTIAL (CANCER CENTER ONLY)
Abs Immature Granulocytes: 0.01 10*3/uL (ref 0.00–0.07)
Basophils Absolute: 0 10*3/uL (ref 0.0–0.1)
Basophils Relative: 1 %
Eosinophils Absolute: 0.1 10*3/uL (ref 0.0–0.5)
Eosinophils Relative: 2 %
HCT: 42.8 % (ref 39.0–52.0)
Hemoglobin: 15 g/dL (ref 13.0–17.0)
Immature Granulocytes: 0 %
Lymphocytes Relative: 27 %
Lymphs Abs: 1.2 10*3/uL (ref 0.7–4.0)
MCH: 31.1 pg (ref 26.0–34.0)
MCHC: 35 g/dL (ref 30.0–36.0)
MCV: 88.8 fL (ref 80.0–100.0)
Monocytes Absolute: 0.5 10*3/uL (ref 0.1–1.0)
Monocytes Relative: 11 %
Neutro Abs: 2.8 10*3/uL (ref 1.7–7.7)
Neutrophils Relative %: 59 %
Platelet Count: 304 10*3/uL (ref 150–400)
RBC: 4.82 MIL/uL (ref 4.22–5.81)
RDW: 12.9 % (ref 11.5–15.5)
WBC Count: 4.6 10*3/uL (ref 4.0–10.5)
nRBC: 0 % (ref 0.0–0.2)

## 2022-08-01 LAB — CMP (CANCER CENTER ONLY)
ALT: 28 U/L (ref 0–44)
AST: 15 U/L (ref 15–41)
Albumin: 3.7 g/dL (ref 3.5–5.0)
Alkaline Phosphatase: 68 U/L (ref 38–126)
Anion gap: 8 (ref 5–15)
BUN: 11 mg/dL (ref 6–20)
CO2: 26 mmol/L (ref 22–32)
Calcium: 9.2 mg/dL (ref 8.9–10.3)
Chloride: 100 mmol/L (ref 98–111)
Creatinine: 1.15 mg/dL (ref 0.61–1.24)
GFR, Estimated: >60 mL/min (ref >60)
Glucose, Bld: 510 mg/dL (ref 70–99)
Potassium: 4.2 mmol/L (ref 3.5–5.1)
Sodium: 134 mmol/L — ABNORMAL LOW (ref 135–145)
Total Bilirubin: 0.5 mg/dL (ref 0.3–1.2)
Total Protein: 6.6 g/dL (ref 6.5–8.1)

## 2022-08-01 MED ORDER — PREDNISONE 5 MG PO TABS
5.0000 mg | ORAL_TABLET | Freq: Every day | ORAL | 1 refills | Status: DC
  Filled 2022-08-01 – 2022-08-03 (×2): qty 90, 90d supply, fill #0
  Filled 2022-10-21: qty 30, 30d supply, fill #1
  Filled 2022-11-21: qty 30, 30d supply, fill #2
  Filled 2022-12-19: qty 30, 30d supply, fill #3

## 2022-08-01 MED ORDER — ABIRATERONE ACETATE 250 MG PO TABS
1000.0000 mg | ORAL_TABLET | Freq: Every day | ORAL | 0 refills | Status: DC
  Filled 2022-08-01 – 2022-08-03 (×2): qty 120, 30d supply, fill #0

## 2022-08-01 NOTE — Telephone Encounter (Signed)
CRITICAL VALUE STICKER  CRITICAL VALUE:  Glucose 510  repeat-[phone]  RECEIVER (on-site recipient of call): Daneil Dolin, LPN  DATE & TIME NOTIFIED: 08/01/22  11:00  MESSENGER (representative from lab):  Hillary  MD NOTIFIED: Jeanie Sewer, MD  TIME OF NOTIFICATION:11:03  RESPONSE:

## 2022-08-01 NOTE — Telephone Encounter (Signed)
Oral Oncology Pharmacist Encounter  Received new prescription for Zytiga (abiraterone acetate) for the treatment of metastatic castration sensitive prostate cancer in conjunction with prednisone and ADT, planned duration until disease progression or unacceptable drug toxicity. Of note it appears patient was previously on Zytiga in 08/2019.  CBC w/ Diff and CMP from 08/01/22 assessed, patient with blood glucose 510 mg/dL - MD OV aware of this. Patient with Scr of 1.15 mg/dL (CrCl ~16.1 mL/min). No baseline renal or hepatic dose adjustments required. Prescription dose and frequency assessed for appropriateness.  Current medication list in Epic reviewed, no relevant/significant DDIs with Zytiga identified.  Evaluated chart and no patient barriers to medication adherence noted.   Prescription has been e-scribed to the [facility] for benefits analysis and approval.  Oral Oncology Clinic will continue to follow for insurance authorization, copayment issues, initial counseling and start date.  Lenord Carbo, PharmD, BCPS, [facility] Hematology/Oncology Clinical Pharmacist [facility] and [facility] Oral Chemotherapy Navigation Clinics [phone] 08/01/2022 12:34 PM

## 2022-08-01 NOTE — Telephone Encounter (Signed)
TCT pt's wife, [name]. Spoke with her and advised that pt's Blood glucose level is high @ 510. Advised that they need to reach out to his PCP for next steps to address this. She voiced understanding and verbalized the seriousness of this result. She states she will contact pt's PCP. Dr. Maurine Cane aware of lab results.

## 2022-08-01 NOTE — Telephone Encounter (Signed)
Oral Oncology Patient Advocate Encounter  After completing a benefits investigation, prior authorization for abiraterone is not required at this time through [facility].  Patient's copay is $4.     Jinger Neighbors, CPhT-Adv Oncology Pharmacy Patient Advocate [facility] Direct Number: [phone]  Fax: [phone]

## 2022-08-01 NOTE — Progress Notes (Signed)
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 Gleason 4+4 adenocarcinoma 05/2019: TURP with Dr. Liliane Shi 10/22/2019: completed IMRT 05/10/2022: CT abdomen/pelvis w/ contrast showed stable exam. No evidence of metastatic disease within the abdomen or pelvis. 05/19/2022: NM PET (PSMA) showed focus of radiotracer accumulation in posterior prostatic apex, consistent with recurrent prostate carcinoma as well as sub-centimeter abdominal retroperitoneal lymph nodes show radiotracer accumulation, consistent with lymph node metastases 08/01/2022: establish care with Dr. Leonides Schanz   CHIEF COMPLAINTS/PURPOSE OF CONSULTATION:  "Prostate Cancer spread to lymph nodes "  HISTORY OF PRESENTING ILLNESS:  Jonathan Austin 57 y.o. male with medical history significant for prostate cancer status post TURP and radiation who presents for evaluation of recurrent disease with involvement of the prostate and lymph nodes.  On review of the previous records Jonathan Austin was initially diagnosed in 2021 when he had a PSA of 317.  He underwent a biopsy which showed Gleason 4+4 adenocarcinoma.  He subsequently underwent a TURP as well as radiation therapy which ended on 10/22/2019.  He had been following with urology in [place] when his PSA began rising.  On 05/19/2022 he underwent a PSMA scan which showed evidence of recurrent prostate cancer as well as involvement of lymph nodes.  Due to concern for these findings the patient was referred to oncology for further evaluation and management.  On exam today Jonathan Austin is accompanied by his wife.  He reports that his major issue is currently bladder control.  He has to  wear depends because he has a urinary incontinence.  He reports that he is not having any pain or unexplained weight loss.  He currently follows with urology and is hoping to get his incontinence under better control.  On further discussion he reports that his parents have type 2 diabetes and his paternal grandmother had a pacemaker.  He thinks his paternal grandfather had cancer but he is unsure what kind.  The patient does not smoke tobacco but does smoke crack cocaine.  He reports he smokes about 1 g/day.  He drinks alcohol reportedly "at least 6 pack/day".  He otherwise denies any fevers, chills, sweats, nausea, vomiting or diarrhea.  A full 10 point ROS is otherwise negative.  MEDICAL HISTORY:  Past Medical History:  Diagnosis Date   Alcohol use disorder, severe, dependence (HCC)    05-24-2019 recent [facility] admission for suicide ideation and alcohol / cocaine detox 05-04-2019 in epic,  pt stated has been taking naltrexone daily as prescribed since and no alcohol since   Cocaine abuse (HCC)    05-24-2019  pt had recent [facility] admission, pt stated last used  "crack" 05-03-2019   Cocaine abuse with cocaine-induced mood disorder (HCC)    Depression    GERD (gastroesophageal reflux disease)    History of suicidal ideation    multiple [facility] admission's;  last one admitted 05-04-2019   Hyperplasia of prostate with lower urinary tract symptoms (LUTS)    Hypertension    followed by pcp   OSA on CPAP    uses intermittently   Phimosis    Prostate cancer [facility]) urologist-- dr winter   dx 02/ 2021;  localized advanced, gleason 4+4, PSA 317   Renal insufficiency  Sinus tachycardia    Thoracic aortic atherosclerosis (HCC)    Type 2 diabetes mellitus treated with insulin (HCC)    followed by pcp   (05-24-2019  per stated checks blood sugar twice daily, fasting sugar-- 180-200)   Wears glasses     SURGICAL HISTORY: Past Surgical History:  Procedure Laterality Date   CIRCUMCISION N/A 05/29/2019    Procedure: CIRCUMCISION ADULT;  Surgeon: Rene Paci, MD;  Location: [facility];  Service: Urology;  Laterality: N/A;   CYSTOSCOPY WITH URETHRAL DILATATION N/A 06/08/2021   Procedure: CYSTOSCOPY WITH URETHRAL DILATATION;  Surgeon: Riki Altes, MD;  Location: [facility];  Service: Urology;  Laterality: N/A;   PROSTATE BIOPSY     TONSILLECTOMY  child   TRANSURETHRAL RESECTION OF PROSTATE N/A 05/29/2019   Procedure: TRANSURETHRAL RESECTION OF THE PROSTATE (TURP)/ CYSTOSCOPY;  Surgeon: Rene Paci, MD;  Location: [facility];  Service: Urology;  Laterality: N/A;    SOCIAL HISTORY: Social History   Socioeconomic History   Marital status: Married    Spouse name: Doris   Number of children: 1   Years of education: 12   Highest education level: GED or equivalent  Occupational History   Not on file  Tobacco Use   Smoking status: Never    Passive exposure: Never   Smokeless tobacco: Never  Vaping Use   Vaping Use: Never used  Substance and Sexual Activity   Alcohol use: Yes    Alcohol/week: 2.0 standard drinks of alcohol    Types: 2 Cans of beer per week    Comment: drinks beer 40 oz x 2 daily  (05-24-2019 per pt no alcohol since 05-03-2019)   Drug use: Yes    Types: Cocaine, Marijuana    Comment: smokes crack cocaine   Sexual activity: Not Currently  Other Topics Concern   Not on file  Social History Narrative   Only child is deceased   Social Determinants of Health   Financial Resource Strain: Not on file  Food Insecurity: No Food Insecurity (04/11/2022)   Hunger Vital Sign    Worried About Running Out of Food in the Last Year: Never true    Ran Out of Food in the Last Year: Never true  Transportation Needs: No Transportation Needs (04/11/2022)   PRAPARE - Administrator, Civil Service (Medical): No    Lack of Transportation (Non-Medical): No  Physical Activity: Not on file  Stress: Not on file  Social  Connections: Not on file  Intimate Partner Violence: Not At Risk (04/11/2022)   Humiliation, Afraid, Rape, and Kick questionnaire    Fear of Current or Ex-Partner: No    Emotionally Abused: No    Physically Abused: No    Sexually Abused: No    FAMILY HISTORY: Family History  Problem Relation Age of Onset   Hypertension Mother    Hypertension Father    Diabetes Father    Prostate cancer Maternal Grandfather 27   Breast cancer Neg Hx    Colon cancer Neg Hx    Pancreatic cancer Neg Hx     ALLERGIES:  has No Known Allergies.  MEDICATIONS:  Current Outpatient Medications  Medication Sig Dispense Refill   abiraterone acetate (ZYTIGA) 250 MG tablet Take 4 tablets (1,000 mg total) by mouth daily. Take on an empty stomach 1 hour before or 2 hours after a meal 120 tablet 0   predniSONE (DELTASONE) 5 MG tablet Take 1 tablet (5 mg total) by  mouth daily with breakfast. 90 tablet 1   ACCU-CHEK GUIDE test strip USE UP TO 4 TIMES A DAY AS DIRECTED     Accu-Chek Softclix Lancets lancets SMARTSIG:Topical 1-4 Times Daily     blood glucose meter kit and supplies Dispense based on patient and insurance preference. Use up to four times daily as directed. (FOR ICD-10 E10.9, E11.9). 1 each 0   insulin aspart protamine - aspart (NOVOLOG MIX 70/30 FLEXPEN) (70-30) 100 UNIT/ML FlexPen Inject 20 Units into the skin 2 (two) times daily. 15 mL 11   Insulin Pen Needle 31G X 5 MM MISC 1 Needle by Does not apply route 2 (two) times daily. 100 each 0   NONFORMULARY OR COMPOUNDED ITEM Trimix (30/1/10)-(Pap/Phent/PGE)  Test Dose  1ml vial   Qty #3 Refills 0  [facility] [phone] Fax [phone] 3 each 0   No current facility-administered medications for this visit.    REVIEW OF SYSTEMS:   Constitutional: ( - ) fevers, ( - )  chills , ( - ) night sweats Eyes: ( - ) blurriness of vision, ( - ) double vision, ( - ) watery eyes Ears, nose, mouth, throat, and face: ( - ) mucositis, ( - ) sore  throat Respiratory: ( - ) cough, ( - ) dyspnea, ( - ) wheezes Cardiovascular: ( - ) palpitation, ( - ) chest discomfort, ( - ) lower extremity swelling Gastrointestinal:  ( - ) nausea, ( - ) heartburn, ( - ) change in bowel habits Skin: ( - ) abnormal skin rashes Lymphatics: ( - ) new lymphadenopathy, ( - ) easy bruising Neurological: ( - ) numbness, ( - ) tingling, ( - ) new weaknesses Behavioral/Psych: ( - ) mood change, ( - ) new changes  All other systems were reviewed with the patient and are negative.  PHYSICAL EXAMINATION: Vitals:   08/01/22 0909  BP: 121/80  Pulse: (!) 108  Resp: 17  Temp: 97.7 F (36.5 C)  SpO2: 97%   Filed Weights   08/01/22 0909  Weight: 219 lb 3.2 oz (99.4 kg)    GENERAL: well appearing middle-aged African-American male in NAD  SKIN: skin color, texture, turgor are normal, no rashes or significant lesions EYES: conjunctiva are pink and non-injected, sclera clear LUNGS: clear to auscultation and percussion with normal breathing effort HEART: regular rate & rhythm and no murmurs and no lower extremity edema Musculoskeletal: no cyanosis of digits and no clubbing  PSYCH: alert & oriented x 3, fluent speech NEURO: no focal motor/sensory deficits  LABORATORY DATA:  I have reviewed the data as listed    Latest Ref Rng & Units 08/01/2022    9:54 AM 04/11/2022    3:17 AM 03/03/2022    2:14 AM  CBC  WBC 4.0 - 10.5 K/uL 4.6  5.3  6.0   Hemoglobin 13.0 - 17.0 g/dL 65.7  84.6  96.2   Hematocrit 39.0 - 52.0 % 42.8  43.4  46.0   Platelets 150 - 400 K/uL 304  363  338        Latest Ref Rng & Units 08/01/2022    9:54 AM 04/11/2022    3:17 AM 03/03/2022    2:14 AM  CMP  Glucose 70 - 99 mg/dL 952  841  324   BUN 6 - 20 mg/dL 11  13  19    Creatinine 0.61 - 1.24 mg/dL 4.01  0.27  2.53   Sodium 135 - 145 mmol/L 134  131  129  Potassium 3.5 - 5.1 mmol/L 4.2  4.3  4.0   Chloride 98 - 111 mmol/L 100  99  95   CO2 22 - 32 mmol/L 26  20  23    Calcium 8.9 - 10.3  mg/dL 9.2  8.8  8.8   Total Protein 6.5 - 8.1 g/dL 6.6  6.8    Total Bilirubin 0.3 - 1.2 mg/dL 0.5  0.4    Alkaline Phos 38 - 126 U/L 68  72    AST 15 - 41 U/L 15  21    ALT 0 - 44 U/L 28  28      RADIOGRAPHIC STUDIES: No results found.  ASSESSMENT & PLAN Jonathan Austin 57 y.o. male with medical history significant for prostate cancer status post TURP and radiation who presents for evaluation of recurrent disease with involvement of the prostate and lymph nodes.  After review of the labs, review of the records, and discussion with the patient the patients findings are most consistent with castrate sensitive prostate cancer with metastatic spread to the lymph nodes.  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   #Polysubstance Abuse -- patient drinks 6 pack of beer daily  -- patient smokes 1 g of crack cocaine daily -- discussed cutting back/cessation of substances today    Orders Placed This Encounter  Procedures   CBC with Differential (Cancer Center Only)    Standing Status:   Future    Number of Occurrences:   1    Standing Expiration Date:   08/01/2023   CMP (Cancer Center only)    Standing Status:   Future    Number of Occurrences:   1    Standing Expiration Date:   08/01/2023   Testosterone    Standing Status:   Future    Number of Occurrences:   1    Standing Expiration Date:   08/01/2023   Prostate-Specific AG, Serum    Standing Status:   Future    Number of Occurrences:   1    Standing Expiration Date:   08/01/2023    All questions were answered. The patient knows to call the clinic with any problems, questions or concerns.  A  total of more than 60 minutes were spent on this encounter with face-to-face time and non-face-to-face time, including preparing to see the patient, ordering tests and/or medications, counseling the patient and coordination of care as outlined above.   Ulysees Barns, MD Department of Hematology/Oncology [facility] Phone: [phone] Pager: [phone] Email: [email or web address]  08/01/2022 12:05 PM

## 2022-08-01 NOTE — Progress Notes (Signed)
Introduced myself to the patient, and his wife, as the prostate nurse navigator.  He is here to discuss his treatment options. Patient verbalized concerns with finances and insurance.  RN will refer to social workers for resources. I gave him my business card and asked him to call me with questions or concerns.  Verbalized understanding.

## 2022-08-02 ENCOUNTER — Inpatient Hospital Stay: Payer: Medicaid Other | Admitting: Licensed Clinical Social Worker

## 2022-08-02 ENCOUNTER — Telehealth: Payer: Self-pay | Admitting: Hematology and Oncology

## 2022-08-02 DIAGNOSIS — C61 Malignant neoplasm of prostate: Secondary | ICD-10-CM

## 2022-08-02 NOTE — Telephone Encounter (Signed)
Oral Chemotherapy Pharmacist Encounter  I spoke with patient's wife, [name], for overview of: Zytiga for the treatment of metastatic, castration-sensitive prostate cancer in conjunction with prednisone, planned duration until disease progression or unacceptable toxicity.   Counseled on administration, dosing, side effects, monitoring, drug-food interactions, safe handling, storage, and disposal.  Patient will take Zytiga 250mg  tablets, 4 tablets (1000mg ) by mouth once daily on an empty stomach, 1 hour before or 2 hours after a meal.  Patient will take prednisone 5mg  tablet, 1 tablet by mouth one daily with breakfast.  Zytiga start date: 08/05/22 AM  Adverse effects include but are not limited to: peripheral edema, GI upset, hypertension, hot flashes, fatigue, and arthralgias.    Reviewed importance of keeping a medication schedule and plan for any missed doses. No barriers to medication adherence identified.  Medication reconciliation performed and medication/allergy list updated.  All questions answered.  Ms. Gaertner voiced understanding and appreciation.   Medication education handout placed in mail for patient. Patient and patient's wife know to call the office with questions or concerns. Oral Chemotherapy Clinic phone number provided.   Lenord Carbo, PharmD, BCPS, [facility] Hematology/Oncology Clinical Pharmacist [facility] and [facility] Oral Chemotherapy Navigation Clinics [phone] 08/02/2022 11:27 AM

## 2022-08-02 NOTE — Progress Notes (Signed)
[facility] Clinical Social Work  Initial Assessment   Jonathan Austin is a 57 y.o. year old male contacted by phone. Clinical Social Work was referred by medical provider for assessment of psychosocial needs.   SDOH (Social Determinants of Health) assessments performed: Yes SDOH Interventions    Flowsheet Row ED from 08/25/2019 in [facility] at [facility]  SDOH Interventions   Depression Interventions/Treatment  Referral to Psychiatry       SDOH Screenings   Food Insecurity: No Food Insecurity (04/11/2022)  Housing: Low Risk  (04/11/2022)  Transportation Needs: No Transportation Needs (04/11/2022)  Utilities: Not At Risk (04/11/2022)  Alcohol Screen: High Risk (04/11/2022)  Depression (PHQ2-9): Low Risk  (05/10/2022)  Tobacco Use: Low Risk  (06/29/2022)     Distress Screen completed: Yes    01/09/2020    9:27 AM  ONCBCN DISTRESS SCREENING  Screening Type Initial Screening  Distress experienced in past week (1-10) 0      Family/Social Information:  Housing Arrangement: patient lives with his wife, Jonathan Austin who has been retired for the past 6 years. Family members/support persons in your life? Pt states his wife is his sole source of support.  Per pt he does not have family residing locally and does not have a supportive network of friends he is able to rely on. Transportation concerns: no  Employment: Unemployed pt states he has not worked in over a year due to medical issues.  Per pt he applied for disability, but received a denial a few months ago.  Pt receives Medicaid, but states he was not approved for SSI.  Income source: Supported by [employer] and [employer] Financial concerns: Yes, current concerns Type of concern: Utilities and Biomedical scientist access concerns: no Religious or spiritual practice: Not known Services Currently in place:  none  Coping/ Adjustment to diagnosis: Patient understands treatment plan and what happens next? yes Concerns about  diagnosis and/or treatment: Overwhelmed by information, How I will pay for the services I need, How will I care for myself, and Quality of life Patient reported stressors: Finances, Depression, Anxiety/ nervousness, and Adjusting to my illness Hopes and/or priorities: Pt's priority is to start treatment w/ the hope of positive results. Patient enjoys  not addressed Current coping skills/ strengths: Capable of independent living  and Motivation for treatment/growth     SUMMARY: Current SDOH Barriers:  Financial constraints related to no income and Limited social support  Clinical Social Work Clinical Goal(s):  Explore community resource options for unmet needs related to:  Financial Strain   Interventions: Discussed common feeling and emotions when being diagnosed with cancer, and the importance of support during treatment Informed patient of the support team roles and support services at [facility] Provided CSW contact information and encouraged patient to call with any questions or concerns CSW discussed sending a referral to the [facility] to apply for long term SSD as he has been diagnosed w/ a recurrence of metastatic prostate cancer since he last applied.  Pt was to meet CSW today to sign referral; however, was unable to keep appointment.  Voicemail left for pt to reschedule or to request form be mailed and then returned as pt's email address does not work.  CSW spoke w/ Jonathan Austin regarding allowing pt to re-apply for a 2nd [employer] as he used the [employer] upon initial diagnosis in 2021.  Pt approved to apply for second grant.  CSW provided contact details for patient financial resource specialist to  apply.  Pt encouraged to reconnect w/ a psychiatrist and counselor given recent [facility] inpatient admission and history of depression.  Pt aware CSW also is available for counseling.     Follow Up Plan: Patient will contact CSW with any support or resource needs Patient verbalizes  understanding of plan: Yes    Rachel Moulds, LCSW Clinical Social Worker [facility]  Patient is participating in a Managed Medicaid Plan:  Yes

## 2022-08-03 ENCOUNTER — Other Ambulatory Visit: Payer: Self-pay

## 2022-08-03 ENCOUNTER — Inpatient Hospital Stay: Payer: Medicaid Other | Admitting: Licensed Clinical Social Worker

## 2022-08-03 ENCOUNTER — Other Ambulatory Visit (HOSPITAL_COMMUNITY): Payer: Self-pay

## 2022-08-03 ENCOUNTER — Encounter: Payer: Self-pay | Admitting: Hematology and Oncology

## 2022-08-03 DIAGNOSIS — C61 Malignant neoplasm of prostate: Secondary | ICD-10-CM

## 2022-08-03 LAB — TESTOSTERONE: Testosterone: 269 ng/dL (ref 264–916)

## 2022-08-03 LAB — PROSTATE-SPECIFIC AG, SERUM (LABCORP): Prostate Specific Ag, Serum: 9.8 ng/mL — ABNORMAL HIGH (ref 0.0–4.0)

## 2022-08-03 NOTE — Progress Notes (Signed)
[facility] CSW Progress Note  Visual merchandiser  spoke with pt over the phone.  Pt requested the acknowledgment form for the [facility] be sent to him through the mail.  CSW mailed to pt's home at his request.  Pt to bring in the signed form at the next visit.  CSW to continue to follow as appropriate.        Rachel Moulds, LCSW Clinical Social Worker [facility]    Patient is participating in a Managed Medicaid Plan:  Yes

## 2022-08-03 NOTE — Progress Notes (Signed)
Received call from patient referred by Child psychotherapist.  Introduced myself as Dance movement psychotherapist and to offer available resources. Discussed one-time $1000 and $200 Prostate grants to assist with personal expenses while going through treatment. Advised what is needed to apply and asked patient if he can provide at next visit on 08/08/22 at check-in. Advised he will be given a grant packet with an expense sheet for [place] to discuss and to contact me at his earliest convenience to discuss in detail. He verbalized understanding.  He has my card for any additional financial questions or concerns.

## 2022-08-04 NOTE — Progress Notes (Signed)
Patient has been contacted by CSW and financial advocate to address financial barriers.    Patient is scheduled for Lupron, and Zytiga start date of 6/28.   RN will follow up after start of medication to ensure all navigational needs have been met.

## 2022-08-08 ENCOUNTER — Inpatient Hospital Stay: Payer: MEDICAID | Admitting: Licensed Clinical Social Worker

## 2022-08-08 ENCOUNTER — Inpatient Hospital Stay: Payer: MEDICAID | Attending: Hematology and Oncology

## 2022-08-08 ENCOUNTER — Encounter: Payer: Self-pay | Admitting: Hematology and Oncology

## 2022-08-08 VITALS — BP 122/78 | HR 88 | Temp 98.2°F | Resp 18

## 2022-08-08 DIAGNOSIS — Z7952 Long term (current) use of systemic steroids: Secondary | ICD-10-CM | POA: Insufficient documentation

## 2022-08-08 DIAGNOSIS — Z923 Personal history of irradiation: Secondary | ICD-10-CM | POA: Insufficient documentation

## 2022-08-08 DIAGNOSIS — C61 Malignant neoplasm of prostate: Secondary | ICD-10-CM | POA: Insufficient documentation

## 2022-08-08 DIAGNOSIS — R3981 Functional urinary incontinence: Secondary | ICD-10-CM | POA: Diagnosis not present

## 2022-08-08 DIAGNOSIS — Z8042 Family history of malignant neoplasm of prostate: Secondary | ICD-10-CM | POA: Diagnosis not present

## 2022-08-08 DIAGNOSIS — F1090 Alcohol use, unspecified, uncomplicated: Secondary | ICD-10-CM | POA: Insufficient documentation

## 2022-08-08 DIAGNOSIS — F1410 Cocaine abuse, uncomplicated: Secondary | ICD-10-CM | POA: Insufficient documentation

## 2022-08-08 DIAGNOSIS — C779 Secondary and unspecified malignant neoplasm of lymph node, unspecified: Secondary | ICD-10-CM | POA: Insufficient documentation

## 2022-08-08 MED ORDER — LEUPROLIDE ACETATE (3 MONTH) 22.5 MG IM KIT: 22.5000 mg | PACK | Freq: Once | INTRAMUSCULAR | Status: DC

## 2022-08-08 MED ORDER — LEUPROLIDE ACETATE (3 MONTH) 22.5 MG ~~LOC~~ KIT
22.5000 mg | PACK | Freq: Once | SUBCUTANEOUS | Status: AC
  Administered 2022-08-08: 22.5 mg via SUBCUTANEOUS
  Filled 2022-08-08: qty 22.5

## 2022-08-08 NOTE — Patient Instructions (Signed)
Leuprolide Solution for Injection What is this medication? LEUPROLIDE (loo PROE lide) reduces the symptoms of prostate cancer. It works by decreasing levels of the hormone testosterone in the body. This prevents prostate cancer cells from spreading or growing. This medicine may be used for other purposes; ask your health care provider or pharmacist if you have questions. COMMON BRAND NAME(S): Lupron What should I tell my care team before I take this medication? They need to know if you have any of these conditions: Diabetes Heart attack Heart disease High blood pressure High cholesterol Pain or difficulty passing urine Spinal cord metastasis Stroke Tobacco use An unusual or allergic reaction to leuprolide, other medications, foods, dyes, or preservatives Pregnant or trying to get pregnant Breast-feeding How should I use this medication? This medication is for injection under the skin or into a muscle. You will be taught how to prepare and give this medication. Use exactly as directed. Take your medication at regular intervals. Do not take it more often than directed. It is important that you put your used needles and syringes in a special sharps container. Do not put them in a trash can. If you do not have a sharps container, call your care team to get one. A special MedGuide will be given to you by the pharmacist with each prescription and refill. Be sure to read this information carefully each time. Talk to your care team about the use of this medication in children. While this medication may be prescribed for children as young as 8 years for selected conditions, precautions do apply. Overdosage: If you think you have taken too much of this medicine contact a poison control center or emergency room at once. NOTE: This medicine is only for you. Do not share this medicine with others. What if I miss a dose? If you miss a dose, take it as soon as you can. If it is almost time for your next  dose, take only that dose. Do not take double or extra doses. What may interact with this medication? Do not take this medication with any of the following: Chasteberry Cisapride Dronedarone Pimozide Thioridazine This medication may also interact with the following: Estrogen or progestin hormones Herbal or dietary supplements, like black cohosh or DHEA Other medications that cause heart rhythm changes Testosterone This list may not describe all possible interactions. Give your health care provider a list of all the medicines, herbs, non-prescription drugs, or dietary supplements you use. Also tell them if you smoke, drink alcohol, or use illegal drugs. Some items may interact with your medicine. What should I watch for while using this medication? Visit your care team for regular checks on your progress. During the first week, your symptoms may get worse, but then will improve as you continue your treatment. You may get hot flashes, increased bone pain, increased difficulty passing urine, or an aggravation of nerve symptoms. Discuss these effects with your care team, some of them may improve with continued use of this medication. Patients may experience a menstrual cycle or spotting during the first 2 months of therapy with this medication. If this continues, contact your care team. This medication may increase blood sugar. The risk may be higher in patients who already have diabetes. Ask your care team what you can do to lower your risk of diabetes while taking this medication. What side effects may I notice from receiving this medication? Side effects that you should report to your care team as soon as possible: Allergic reactions--skin rash,   itching, hives, swelling of the face, lips, tongue, or throat Heart attack--pain or tightness in the chest, shoulders, arms, or jaw, nausea, shortness of breath, cold or clammy skin, feeling faint or lightheaded Heart rhythm changes--fast or irregular  heartbeat, dizziness, feeling faint or lightheaded, chest pain, trouble breathing High blood sugar (hyperglycemia)--increased thirst or amount of urine, unusual weakness or fatigue, blurry vision Mood swings, irritability, hostility Seizures Stroke--sudden numbness or weakness of the face, arm, or leg, trouble speaking, confusion, trouble walking, loss of balance or coordination, dizziness, severe headache, change in vision Thoughts of suicide or self-harm, worsening mood, feelings of depression Side effects that usually do not require medical attention (report to your care team if they continue or are bothersome): Bone pain Change in sex drive or performance General discomfort and fatigue Hot flashes Muscle pain Pain, redness, or irritation at injection site Swelling of the ankles, hands, or feet This list may not describe all possible side effects. Call your doctor for medical advice about side effects. You may report side effects to FDA at 1-800-FDA-1088. Where should I keep my medication? Keep out of the reach of children and pets. Store below 25 degrees C (77 degrees F). Do not freeze. Protect from light. Get rid of any unused medication after the expiration date. To get rid of medications that are no longer needed or have expired: Take the medication to a medication take-back program. Check with your pharmacy or law enforcement to find a location. If you cannot return the medication, ask your pharmacist or care team how to get rid of this medication safely. NOTE: This sheet is a summary. It may not cover all possible information. If you have questions about this medicine, talk to your doctor, pharmacist, or health care provider.  2024 Elsevier/Gold Standard (2021-06-15 00:00:00)  

## 2022-08-08 NOTE — Progress Notes (Signed)
[facility] CSW Progress Note  Visual merchandiser met with patient in infusion to obtain signature for [facility] referral.  Referral submitted on behalf of pt.  CSW to remain available as appropriate throughout duration of treatment to provide support.      Rachel Moulds, LCSW Clinical Social Worker [facility]    Patient is participating in a Managed Medicaid Plan:  Yes

## 2022-08-10 ENCOUNTER — Ambulatory Visit: Payer: Medicaid Other | Admitting: Podiatry

## 2022-08-12 NOTE — Progress Notes (Signed)
RN spoke with patient to assess any additional needs since starting treatment.  No additional barriers identified at this time.  Patient tolerating treatment well thus far.  RN encouraged patient to please call with any concerns or questions that may arise.

## 2022-08-17 ENCOUNTER — Encounter: Payer: Self-pay | Admitting: Hematology and Oncology

## 2022-08-17 ENCOUNTER — Other Ambulatory Visit: Payer: Self-pay

## 2022-08-31 ENCOUNTER — Inpatient Hospital Stay: Payer: MEDICAID

## 2022-08-31 ENCOUNTER — Other Ambulatory Visit: Payer: Self-pay | Admitting: Hematology and Oncology

## 2022-08-31 ENCOUNTER — Inpatient Hospital Stay: Payer: MEDICAID | Admitting: Hematology and Oncology

## 2022-08-31 DIAGNOSIS — C61 Malignant neoplasm of prostate: Secondary | ICD-10-CM

## 2022-08-31 NOTE — Progress Notes (Signed)
Rescheduled

## 2022-09-01 ENCOUNTER — Other Ambulatory Visit: Payer: Self-pay

## 2022-09-01 ENCOUNTER — Other Ambulatory Visit: Payer: Self-pay | Admitting: Hematology and Oncology

## 2022-09-02 ENCOUNTER — Inpatient Hospital Stay: Payer: MEDICAID

## 2022-09-02 ENCOUNTER — Other Ambulatory Visit: Payer: Self-pay

## 2022-09-02 DIAGNOSIS — C61 Malignant neoplasm of prostate: Secondary | ICD-10-CM | POA: Diagnosis not present

## 2022-09-02 LAB — CBC WITH DIFFERENTIAL (CANCER CENTER ONLY)
Abs Immature Granulocytes: 0.03 10*3/uL (ref 0.00–0.07)
Basophils Absolute: 0 10*3/uL (ref 0.0–0.1)
Basophils Relative: 1 %
Eosinophils Absolute: 0.1 10*3/uL (ref 0.0–0.5)
Eosinophils Relative: 3 %
HCT: 39.4 % (ref 39.0–52.0)
Hemoglobin: 13.9 g/dL (ref 13.0–17.0)
Immature Granulocytes: 1 %
Lymphocytes Relative: 31 %
Lymphs Abs: 1.2 10*3/uL (ref 0.7–4.0)
MCH: 30.6 pg (ref 26.0–34.0)
MCHC: 35.3 g/dL (ref 30.0–36.0)
MCV: 86.8 fL (ref 80.0–100.0)
Monocytes Absolute: 0.4 10*3/uL (ref 0.1–1.0)
Monocytes Relative: 10 %
Neutro Abs: 2.1 10*3/uL (ref 1.7–7.7)
Neutrophils Relative %: 54 %
Platelet Count: 327 10*3/uL (ref 150–400)
RBC: 4.54 MIL/uL (ref 4.22–5.81)
RDW: 12.3 % (ref 11.5–15.5)
WBC Count: 3.9 10*3/uL — ABNORMAL LOW (ref 4.0–10.5)
nRBC: 0 % (ref 0.0–0.2)

## 2022-09-02 LAB — CMP (CANCER CENTER ONLY)
ALT: 29 U/L (ref 0–44)
AST: 18 U/L (ref 15–41)
Albumin: 3.7 g/dL (ref 3.5–5.0)
Alkaline Phosphatase: 76 U/L (ref 38–126)
Anion gap: 6 (ref 5–15)
BUN: 8 mg/dL (ref 6–20)
CO2: 29 mmol/L (ref 22–32)
Calcium: 9.1 mg/dL (ref 8.9–10.3)
Chloride: 103 mmol/L (ref 98–111)
Creatinine: 0.91 mg/dL (ref 0.61–1.24)
GFR, Estimated: >60 mL/min (ref >60)
Glucose, Bld: 146 mg/dL — ABNORMAL HIGH (ref 70–99)
Potassium: 3.7 mmol/L (ref 3.5–5.1)
Sodium: 138 mmol/L (ref 135–145)
Total Bilirubin: 0.4 mg/dL (ref 0.3–1.2)
Total Protein: 6.7 g/dL (ref 6.5–8.1)

## 2022-09-03 ENCOUNTER — Encounter: Payer: Self-pay | Admitting: Hematology and Oncology

## 2022-09-05 ENCOUNTER — Other Ambulatory Visit: Payer: Self-pay | Admitting: Hematology and Oncology

## 2022-09-05 ENCOUNTER — Ambulatory Visit (INDEPENDENT_AMBULATORY_CARE_PROVIDER_SITE_OTHER): Payer: MEDICAID | Admitting: Podiatry

## 2022-09-05 ENCOUNTER — Encounter: Payer: Self-pay | Admitting: Podiatry

## 2022-09-05 ENCOUNTER — Other Ambulatory Visit: Payer: Self-pay

## 2022-09-05 VITALS — BP 146/66 | HR 93

## 2022-09-05 DIAGNOSIS — M79675 Pain in left toe(s): Secondary | ICD-10-CM

## 2022-09-05 DIAGNOSIS — M79674 Pain in right toe(s): Secondary | ICD-10-CM | POA: Diagnosis not present

## 2022-09-05 DIAGNOSIS — E1142 Type 2 diabetes mellitus with diabetic polyneuropathy: Secondary | ICD-10-CM | POA: Diagnosis not present

## 2022-09-05 DIAGNOSIS — B351 Tinea unguium: Secondary | ICD-10-CM

## 2022-09-05 NOTE — Progress Notes (Signed)
This patient returns to my office for at risk foot care.  This patient requires this care by a professional since this patient will be at risk due to having diabetes.  This patient is unable to cut nails himself since the patient cannot reach his nails.These nails are painful walking and wearing shoes.  He presents to the office with male caregiver.This patient presents for at risk foot care today.  General Appearance  Alert, conversant and in no acute stress.  Vascular  Dorsalis pedis and posterior tibial  pulses are palpable  bilaterally.  Capillary return is within normal limits  bilaterally. Temperature is within normal limits  bilaterally.  Neurologic  Senn-Weinstein monofilament wire test diminished  bilaterally. Muscle power within normal limits bilaterally.  Nails Thick disfigured discolored nails with subungual debris  from hallux to fifth toes bilaterally. No evidence of bacterial infection or drainage bilaterally.  Orthopedic  No limitations of motion  feet .  No crepitus or effusions noted.  No bony pathology or digital deformities noted.  Skin  normotropic skin with no porokeratosis noted bilaterally.  No signs of infections or ulcers noted.     Onychomycosis  Pain in right toes  Pain in left toes  Consent was obtained for treatment procedures.   Mechanical debridement of nails 1-5  bilaterally performed with a nail nipper.  Filed with dremel without incident.    Return office visit     3 months                Told patient to return for periodic foot care and evaluation due to potential at risk complications.   Helane Gunther DPM

## 2022-09-06 ENCOUNTER — Other Ambulatory Visit: Payer: Self-pay

## 2022-09-06 ENCOUNTER — Other Ambulatory Visit (HOSPITAL_COMMUNITY): Payer: Self-pay

## 2022-09-06 ENCOUNTER — Encounter: Payer: Self-pay | Admitting: Hematology and Oncology

## 2022-09-06 MED ORDER — ABIRATERONE ACETATE 250 MG PO TABS
1000.0000 mg | ORAL_TABLET | Freq: Every day | ORAL | 0 refills | Status: DC
  Filled 2022-09-06: qty 120, 30d supply, fill #0

## 2022-09-16 ENCOUNTER — Emergency Department (HOSPITAL_COMMUNITY): Payer: MEDICAID

## 2022-09-16 ENCOUNTER — Encounter (HOSPITAL_COMMUNITY): Payer: Self-pay | Admitting: Emergency Medicine

## 2022-09-16 ENCOUNTER — Telehealth: Payer: Self-pay | Admitting: *Deleted

## 2022-09-16 ENCOUNTER — Other Ambulatory Visit: Payer: Self-pay

## 2022-09-16 ENCOUNTER — Emergency Department (HOSPITAL_COMMUNITY)
Admission: EM | Admit: 2022-09-16 | Discharge: 2022-09-16 | Disposition: A | Discharge status: Home / Self Care | Payer: MEDICAID | Attending: Emergency Medicine | Admitting: Emergency Medicine

## 2022-09-16 DIAGNOSIS — C61 Malignant neoplasm of prostate: Secondary | ICD-10-CM | POA: Diagnosis not present

## 2022-09-16 DIAGNOSIS — Z794 Long term (current) use of insulin: Secondary | ICD-10-CM | POA: Insufficient documentation

## 2022-09-16 DIAGNOSIS — R39198 Other difficulties with micturition: Secondary | ICD-10-CM | POA: Diagnosis present

## 2022-09-16 DIAGNOSIS — R339 Retention of urine, unspecified: Secondary | ICD-10-CM | POA: Insufficient documentation

## 2022-09-16 LAB — CBC WITH DIFFERENTIAL/PLATELET
Abs Immature Granulocytes: 0.03 10*3/uL (ref 0.00–0.07)
Basophils Absolute: 0 10*3/uL (ref 0.0–0.1)
Basophils Relative: 1 %
Eosinophils Absolute: 0.1 10*3/uL (ref 0.0–0.5)
Eosinophils Relative: 3 %
HCT: 42.5 % (ref 39.0–52.0)
Hemoglobin: 14.4 g/dL (ref 13.0–17.0)
Immature Granulocytes: 1 %
Lymphocytes Relative: 17 %
Lymphs Abs: 1 10*3/uL (ref 0.7–4.0)
MCH: 29.9 pg (ref 26.0–34.0)
MCHC: 33.9 g/dL (ref 30.0–36.0)
MCV: 88.4 fL (ref 80.0–100.0)
Monocytes Absolute: 0.5 10*3/uL (ref 0.1–1.0)
Monocytes Relative: 9 %
Neutro Abs: 4 10*3/uL (ref 1.7–7.7)
Neutrophils Relative %: 69 %
Platelets: 323 10*3/uL (ref 150–400)
RBC: 4.81 MIL/uL (ref 4.22–5.81)
RDW: 12.5 % (ref 11.5–15.5)
WBC: 5.7 10*3/uL (ref 4.0–10.5)
nRBC: 0 % (ref 0.0–0.2)

## 2022-09-16 LAB — URINALYSIS, ROUTINE W REFLEX MICROSCOPIC
Bilirubin Urine: NEGATIVE
Glucose, UA: >=500 mg/dL — AB
Ketones, ur: NEGATIVE mg/dL
Nitrite: NEGATIVE
Protein, ur: NEGATIVE mg/dL
Specific Gravity, Urine: 1.025 (ref 1.005–1.030)
pH: 6 (ref 5.0–8.0)

## 2022-09-16 LAB — COMPREHENSIVE METABOLIC PANEL
ALT: 31 U/L (ref 0–44)
AST: 23 U/L (ref 15–41)
Albumin: 3.8 g/dL (ref 3.5–5.0)
Alkaline Phosphatase: 95 U/L (ref 38–126)
Anion gap: 10 (ref 5–15)
BUN: 14 mg/dL (ref 6–20)
CO2: 27 mmol/L (ref 22–32)
Calcium: 9.4 mg/dL (ref 8.9–10.3)
Chloride: 96 mmol/L — ABNORMAL LOW (ref 98–111)
Creatinine, Ser: 0.98 mg/dL (ref 0.61–1.24)
GFR, Estimated: >60 mL/min (ref >60)
Glucose, Bld: 384 mg/dL — ABNORMAL HIGH (ref 70–99)
Potassium: 4.2 mmol/L (ref 3.5–5.1)
Sodium: 133 mmol/L — ABNORMAL LOW (ref 135–145)
Total Bilirubin: 0.8 mg/dL (ref 0.3–1.2)
Total Protein: 8 g/dL (ref 6.5–8.1)

## 2022-09-16 MED ORDER — IOHEXOL 300 MG/ML  SOLN
100.0000 mL | Freq: Once | INTRAMUSCULAR | Status: AC | PRN
  Administered 2022-09-16: 100 mL via INTRAVENOUS

## 2022-09-16 NOTE — Discharge Instructions (Signed)
Please follow-up with urology for your catheter placement. You were retaining urine and unable to urinate thus we had to place a catheter. Return to the ED if you fever, chills, or abdominal pain.

## 2022-09-16 NOTE — ED Triage Notes (Signed)
Patient presents due to urinary retention. Was able to urinate this morning, but noticed dribble to the end. He has not able to urinate since and now has lower abdominal pain.  Hx; Prostate cancer with mets  EMS vitals: 170/88 BP 101 HR 18 RR  98% SPO2 on room air

## 2022-09-16 NOTE — Telephone Encounter (Signed)
Received vm message from pt's wife, Jonathan Austin. She states Mr. Jonathan Austin is currently in the ED at [facility]. He has been unable to pass his urine today and is very uncomfortable, lower abd. Pain.  Dr. Leonides Schanz made aware.

## 2022-09-16 NOTE — ED Notes (Signed)
Patient transported to CT 

## 2022-09-16 NOTE — ED Provider Notes (Signed)
[place] EMERGENCY DEPARTMENT AT [facility] Provider Note   CSN: [id number] Arrival date & time: 09/16/22  1422     History  Chief Complaint  Patient presents with   Urinary Retention    Jonathan Austin is a 57 y.o. male, history of metastatic prostate cancer, on immunotherapy, who presents to the ED secondary to difficulty urinating since 2 PM today.  He states he was frequently urinating last night, and then today he feels like he cannot urinate.  Last urinated at 2 PM.  He states that initially was coming out in spurts erratically, and was having a hard time controlling the spurts, but then started noticing some urinary hesitancy, and having to press down when urinating.  Has denied any blood in his stool, or dysuria, or pain until now.  He states that now he has a lot of suprapubic pain, and feels pressure.     Home Medications Prior to Admission medications   Medication Sig Start Date End Date Taking? Authorizing Provider  abiraterone acetate (ZYTIGA) 250 MG tablet Take 4 tablets (1,000 mg total) by mouth daily. Take on an empty stomach 1 hour before or 2 hours after a meal 09/06/22   Jaci Standard, MD  ACCU-CHEK GUIDE test strip USE UP TO 4 TIMES A DAY AS DIRECTED 05/05/22   [provider]  Accu-Chek Softclix Lancets lancets SMARTSIG:Topical 1-4 Times Daily 05/05/22   [provider]  blood glucose meter kit and supplies Dispense based on patient and insurance preference. Use up to four times daily as directed. (FOR ICD-10 E10.9, E11.9). 03/03/22   Benjiman Core, MD  insulin aspart protamine - aspart (NOVOLOG MIX 70/30 FLEXPEN) (70-30) 100 UNIT/ML FlexPen Inject 20 Units into the skin 2 (two) times daily. 03/03/22   Benjiman Core, MD  Insulin Pen Needle 31G X 5 MM MISC 1 Needle by Does not apply route 2 (two) times daily. 03/03/22   Benjiman Core, MD  NONFORMULARY OR COMPOUNDED ITEM Trimix (30/1/10)-(Pap/Phent/PGE)  Test Dose  1ml vial    Qty #3 Refills 0  [facility] [phone] Fax [phone] 04/27/22   Michiel Cowboy A, PA-C  predniSONE (DELTASONE) 5 MG tablet Take 1 tablet (5 mg total) by mouth daily with breakfast. 08/01/22   Jaci Standard, MD      Allergies    Patient has no known allergies.    Review of Systems   Review of Systems  Constitutional:  Negative for fever.  Genitourinary:  Positive for difficulty urinating.    Physical Exam Updated Vital Signs BP (!) 182/114 (BP Location: Left Arm)   Pulse (!) 111   Temp 98.3 F (36.8 C) (Oral)   Resp 18   SpO2 98%  Physical Exam Vitals and nursing note reviewed.  Constitutional:      General: He is not in acute distress.    Appearance: He is well-developed.  HENT:     Head: Normocephalic and atraumatic.  Eyes:     Conjunctiva/sclera: Conjunctivae normal.  Cardiovascular:     Rate and Rhythm: Normal rate and regular rhythm.     Heart sounds: No murmur heard. Pulmonary:     Effort: Pulmonary effort is normal. No respiratory distress.     Breath sounds: Normal breath sounds.  Abdominal:     Palpations: Abdomen is soft.     Tenderness: There is abdominal tenderness in the suprapubic area.  Musculoskeletal:        General: No swelling.  Cervical back: Neck supple.  Skin:    General: Skin is warm and dry.     Capillary Refill: Capillary refill takes less than 2 seconds.  Neurological:     Mental Status: He is alert.  Psychiatric:        Mood and Affect: Mood normal.     ED Results / Procedures / Treatments   Labs (all labs ordered are listed, but only abnormal results are displayed) Labs Reviewed  URINALYSIS, ROUTINE W REFLEX MICROSCOPIC - Abnormal; Notable for the following components:      Result Value   APPearance HAZY (*)    Glucose, UA >=500 (*)    Hgb urine dipstick  (*)    Leukocytes,Ua TRACE (*)    Bacteria, UA RARE (*)    All other components within normal limits  COMPREHENSIVE METABOLIC PANEL -  Abnormal; Notable for the following components:   Sodium 133 (*)    Chloride 96 (*)    Glucose, Bld 384 (*)    All other components within normal limits  URINE CULTURE  CBC WITH DIFFERENTIAL/PLATELET    EKG None  Radiology CT ABDOMEN PELVIS W CONTRAST  Result Date: 09/16/2022 CLINICAL DATA:  History of prostate carcinoma with difficulty urinating, initial encounter EXAM: CT ABDOMEN AND PELVIS WITH CONTRAST TECHNIQUE: Multidetector CT imaging of the abdomen and pelvis was performed using the standard protocol following bolus administration of intravenous contrast. RADIATION DOSE REDUCTION: This exam was performed according to the departmental dose-optimization program which includes automated exposure control, adjustment of the mA and/or kV according to patient size and/or use of iterative reconstruction technique. CONTRAST:  OMNIPAQUE IOHEXOL 300 MG/ML  SOLN COMPARISON:  05/19/2022 FINDINGS: Lower chest: No acute abnormality. Hepatobiliary: No focal liver abnormality is seen. No gallstones, gallbladder wall thickening, or biliary dilatation. Pancreas: Unremarkable. No pancreatic ductal dilatation or surrounding inflammatory changes. Spleen: Normal in size without focal abnormality. Adrenals/Urinary Tract: Left adrenal gland is within normal limits. Stable right adrenal adenoma is noted. This showed no metabolic activity on recent PET-CT and no further follow-up is recommended. Kidneys show normal enhancement pattern bilaterally. Delayed images demonstrate normal excretion bilaterally. No calculi or obstructive changes are seen. Bladder is decompressed by Foley catheter. Stomach/Bowel: No obstructive or inflammatory changes of the colon are noted. The appendix is within normal limits.  bowel and stomach are unremarkable. Vascular/Lymphatic: Aortic atherosclerosis. No enlarged abdominal or pelvic lymph nodes. Reproductive: Prostate is stable in appearance from the prior PET-CT. Other: No  abdominal wall hernia or abnormality. No abdominopelvic ascites. Musculoskeletal: No acute or significant osseous findings. IMPRESSION: Stable appearance when compared with the prior PET-CT from April of 2024. No acute abnormality is noted. Electronically Signed   By: Alcide Clever M.D.   On: 09/16/2022 19:51    Procedures Procedures    Medications Ordered in ED Medications  iohexol (OMNIPAQUE) 300 MG/ML solution 100 mL (100 mLs Intravenous Contrast Given 09/16/22 1930)    ED Course/ Medical Decision Making/ A&P                                 Medical Decision Making Patent is a 57 y.o. male here for inability to urinate x2 hours. Feels much suprapubic pressure. Hx of metastatic prostate cancer. We will obtain CT abd/pelvis to eval for any mass obstructing voiding d/t hx of prostate cancer. As well will obtain urinalysis, cbc, cmp, and place foley cath as he is retaining  450+ mL  Amount and/or Complexity of Data Reviewed Labs: ordered.    Details: Cr WNL, UA with many glucose, some WBC, and trace bacteria Radiology: ordered.    Details: CT abd/pelvis shows not acute abnormality Discussion of management or test interpretation with external provider(s): Discussed with patient, negative CT scan, urinalysis not specifically concerning for UTI, but given suprapubic pain and 21-50 WBC with culture. I'm suspicious of a possible urethral stricture causing patient inability to void. Foley catheter placed with relief of symptoms. Will have pt follow-up with urology for further eval.   Risk Prescription drug management.   Final Clinical Impression(s) / ED Diagnoses Final diagnoses:  Urinary retention    Rx / DC Orders ED Discharge Orders     None         , Harley Alto, PA 09/16/22 2017    Loetta Rough, MD 09/16/22 [phone]

## 2022-09-19 ENCOUNTER — Telehealth: Payer: Self-pay | Admitting: *Deleted

## 2022-09-19 ENCOUNTER — Telehealth (HOSPITAL_BASED_OUTPATIENT_CLINIC_OR_DEPARTMENT_OTHER): Payer: Self-pay | Admitting: *Deleted

## 2022-09-19 NOTE — Telephone Encounter (Signed)
Post ED Visit - Positive Culture Follow-up: Unsuccessful Patient Follow-up  Culture assessed and recommendations reviewed by:  [x]  Andee Poles, Pharm.D. []  Celedonio Miyamoto, Pharm.D., BCPS AQ-ID []  Garvin Fila, Pharm.D., BCPS []  Georgina Pillion, Pharm.D., BCPS []  [place], [address].D., BCPS, AAHIVP []  Estella Husk, Pharm.D., BCPS, AAHIVP []  Sherlynn Carbon, PharmD []  Pollyann Samples, PharmD, BCPS  Positive urine culture  [x]  Patient discharged without antimicrobial prescription and treatment is now indicated []  Organism is resistant to prescribed ED discharge antimicrobial []  Patient with positive blood cultures  Plan: Cephalexin 500mg  BID x 5 days per Derwood Kaplan, MD  Unable to contact patient , letter will be sent to address on file  Patsey Berthold 09/19/2022, 9:46 AM

## 2022-09-19 NOTE — Progress Notes (Signed)
ED Antimicrobial Stewardship Positive Culture Follow Up   Jonathan Austin is an 57 y.o. male who presented to [facility] on 09/16/2022 with a chief complaint of  Chief Complaint  Patient presents with   Urinary Retention    Recent Results (from the past 720 hour(s))  Urine Culture     Status: Abnormal   Collection Time: 09/16/22  5:02 PM   Specimen: Urine, Catheterized  Result Value Ref Range Status   Specimen Description   Final    URINE, CATHETERIZED Performed at [facility], [address], [place], [place] [address]    Special Requests   Final    NONE Performed at [facility], [address] [place], [place] [address]    Culture >=100,000 COLONIES/mL KLEBSIELLA PNEUMONIAE (A)  Final   Report Status 09/18/2022 FINAL  Final   Organism ID, Bacteria KLEBSIELLA PNEUMONIAE (A)  Final      Susceptibility   Klebsiella pneumoniae - MIC*    AMPICILLIN >=32 RESISTANT Resistant     CEFAZOLIN <=4 SENSITIVE Sensitive     CEFEPIME <=0.12 SENSITIVE Sensitive     CEFTRIAXONE <=0.25 SENSITIVE Sensitive     CIPROFLOXACIN <=0.25 SENSITIVE Sensitive     GENTAMICIN <=1 SENSITIVE Sensitive     IMIPENEM <=0.25 SENSITIVE Sensitive     NITROFURANTOIN 128 RESISTANT Resistant     TRIMETH/SULFA <=20 SENSITIVE Sensitive     AMPICILLIN/SULBACTAM >=32 RESISTANT Resistant     PIP/TAZO 32 INTERMEDIATE Intermediate     * >=100,000 COLONIES/mL KLEBSIELLA PNEUMONIAE   57 yo M with difficulty urinating and abdominal tenderness. He had a foley catheter placed in the ED symptoms were resolved. Patient has scheduled urology appointment for 08/26.  [x]  Patient discharged originally without antimicrobial agent and treatment is now indicated  New antibiotic prescription: Cephalexin 500mg  PO BID x 5 days  ED Provider: Derwood Kaplan, MD   Caprice Beaver PharmD Candidate 2025 09/19/2022 9:15 AM

## 2022-09-19 NOTE — Telephone Encounter (Signed)
Received call from pt. He states he went to the ED on 09/16/22 because he could not pass his urine. A catheter was placed and he was sent home. He has had a h/o urine leakage for some time. He is seen here for prostate cancer. Pt is asking how long he needs to keep the catheter and if the urologists can do a procedure for his bladder issues while he is on prostate cancer treatment. Pt did not know what type of procedure he would have. Advised that he needs to call his urologist about thwe catheter as they would be the ones to address that particular issue. Advised that pt can discuss the possibility of a bladder procedure as it realtes to his prostate cancer treatment with Dr. Leonides Schanz at his next appt which is 09/26/22. Pt voiced understanding and will contact his urologist in [place] today.

## 2022-09-22 ENCOUNTER — Other Ambulatory Visit: Payer: Self-pay | Admitting: Hematology and Oncology

## 2022-09-22 ENCOUNTER — Other Ambulatory Visit (HOSPITAL_COMMUNITY): Payer: Self-pay

## 2022-09-23 ENCOUNTER — Other Ambulatory Visit: Payer: Self-pay

## 2022-09-23 MED ORDER — ABIRATERONE ACETATE 250 MG PO TABS
1000.0000 mg | ORAL_TABLET | Freq: Every day | ORAL | 0 refills | Status: DC
  Filled 2022-09-23: qty 120, 30d supply, fill #0

## 2022-09-26 ENCOUNTER — Encounter: Payer: Self-pay | Admitting: Hematology and Oncology

## 2022-09-26 ENCOUNTER — Other Ambulatory Visit: Payer: Self-pay | Admitting: *Deleted

## 2022-09-26 ENCOUNTER — Inpatient Hospital Stay: Payer: MEDICAID | Attending: Hematology and Oncology

## 2022-09-26 ENCOUNTER — Other Ambulatory Visit: Payer: Self-pay

## 2022-09-26 ENCOUNTER — Inpatient Hospital Stay (HOSPITAL_BASED_OUTPATIENT_CLINIC_OR_DEPARTMENT_OTHER): Payer: MEDICAID | Admitting: Hematology and Oncology

## 2022-09-26 ENCOUNTER — Telehealth (HOSPITAL_BASED_OUTPATIENT_CLINIC_OR_DEPARTMENT_OTHER): Payer: Self-pay | Admitting: *Deleted

## 2022-09-26 VITALS — BP 127/86 | HR 98 | Temp 97.7°F | Resp 19 | Wt 224.2 lb

## 2022-09-26 DIAGNOSIS — C61 Malignant neoplasm of prostate: Secondary | ICD-10-CM

## 2022-09-26 DIAGNOSIS — F1910 Other psychoactive substance abuse, uncomplicated: Secondary | ICD-10-CM | POA: Diagnosis not present

## 2022-09-26 DIAGNOSIS — C772 Secondary and unspecified malignant neoplasm of intra-abdominal lymph nodes: Secondary | ICD-10-CM | POA: Insufficient documentation

## 2022-09-26 DIAGNOSIS — F1410 Cocaine abuse, uncomplicated: Secondary | ICD-10-CM | POA: Insufficient documentation

## 2022-09-26 LAB — CBC WITH DIFFERENTIAL (CANCER CENTER ONLY)
Abs Immature Granulocytes: 0.08 10*3/uL — ABNORMAL HIGH (ref 0.00–0.07)
Basophils Absolute: 0 10*3/uL (ref 0.0–0.1)
Basophils Relative: 1 %
Eosinophils Absolute: 0.2 10*3/uL (ref 0.0–0.5)
Eosinophils Relative: 2 %
HCT: 34 % — ABNORMAL LOW (ref 39.0–52.0)
Hemoglobin: 11.4 g/dL — ABNORMAL LOW (ref 13.0–17.0)
Immature Granulocytes: 1 %
Lymphocytes Relative: 17 %
Lymphs Abs: 1.1 10*3/uL (ref 0.7–4.0)
MCH: 29.8 pg (ref 26.0–34.0)
MCHC: 33.5 g/dL (ref 30.0–36.0)
MCV: 89 fL (ref 80.0–100.0)
Monocytes Absolute: 0.5 10*3/uL (ref 0.1–1.0)
Monocytes Relative: 8 %
Neutro Abs: 4.3 10*3/uL (ref 1.7–7.7)
Neutrophils Relative %: 71 %
Platelet Count: 509 10*3/uL — ABNORMAL HIGH (ref 150–400)
RBC: 3.82 MIL/uL — ABNORMAL LOW (ref 4.22–5.81)
RDW: 12.7 % (ref 11.5–15.5)
WBC Count: 6.1 10*3/uL (ref 4.0–10.5)
nRBC: 0 % (ref 0.0–0.2)

## 2022-09-26 LAB — CMP (CANCER CENTER ONLY)
ALT: 16 U/L (ref 0–44)
AST: 14 U/L — ABNORMAL LOW (ref 15–41)
Albumin: 3.5 g/dL (ref 3.5–5.0)
Alkaline Phosphatase: 74 U/L (ref 38–126)
Anion gap: 9 (ref 5–15)
BUN: 14 mg/dL (ref 6–20)
CO2: 25 mmol/L (ref 22–32)
Calcium: 8.9 mg/dL (ref 8.9–10.3)
Chloride: 104 mmol/L (ref 98–111)
Creatinine: 0.93 mg/dL (ref 0.61–1.24)
GFR, Estimated: >60 mL/min (ref >60)
Glucose, Bld: 280 mg/dL — ABNORMAL HIGH (ref 70–99)
Potassium: 4.2 mmol/L (ref 3.5–5.1)
Sodium: 138 mmol/L (ref 135–145)
Total Bilirubin: 0.3 mg/dL (ref 0.3–1.2)
Total Protein: 7.1 g/dL (ref 6.5–8.1)

## 2022-09-26 NOTE — Progress Notes (Signed)
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 Gleason 4+4 adenocarcinoma 05/2019: TURP with Dr. Liliane Shi 10/22/2019: completed IMRT 05/10/2022: CT abdomen/pelvis w/ contrast showed stable exam. No evidence of metastatic disease within the abdomen or pelvis. 05/19/2022: NM PET (PSMA) showed focus of radiotracer accumulation in posterior prostatic apex, consistent with recurrent prostate carcinoma as well as sub-centimeter abdominal retroperitoneal lymph nodes show radiotracer accumulation, consistent with lymph node metastases 08/01/2022: establish care with Dr. Leonides Schanz.  Started Zytiga 1000 mg p.o. daily with prednisone 5 mg p.o. daily  Interval History:  Jonathan Austin 57 y.o. male with medical history significant for metastatic castrate sensitive prostate cancer who presents for a follow up visit. The patient's last visit was on 08/01/2022 at which time he established care. In the interim since the last visit he missed his appointment on 08/31/2022.  On exam today Jonathan Austin is accompanied by his wife.  He reports that the last 2 months have been "up-and-down".  He notes he has been having difficulty with urinary retention and recently had a catheter put in place on 09/16/2022.  It occurred when he could not urinate.  The catheter is in place though he would like it out.  He notes he will be seeing urology next week and will discuss to that time.  He notes he does have a little bit of blood in the urine but no other overt signs of bleeding.  He reports his energy levels are pretty good.  He notes he takes his Zytiga and prednisone faithfully though when he had this urinary issue he did miss a few  days.  He is not having any major side effects as result of the treatment.  He otherwise denies any fevers, chills, sweats, nausea, vomiting or diarrhea.  A full 10 point ROS is otherwise negative.  MEDICAL HISTORY:  Past Medical History:  Diagnosis Date   Alcohol use disorder, severe, dependence (HCC)    05-24-2019 recent [facility] admission for suicide ideation and alcohol / cocaine detox 05-04-2019 in epic,  pt stated has been taking naltrexone daily as prescribed since and no alcohol since   Cocaine abuse (HCC)    05-24-2019  pt had recent [facility] admission, pt stated last used  "crack" 05-03-2019   Cocaine abuse with cocaine-induced mood disorder (HCC)    Depression    GERD (gastroesophageal reflux disease)    History of suicidal ideation    multiple [facility] admission's;  last one admitted 05-04-2019   Hyperplasia of prostate with lower urinary tract symptoms (LUTS)    Hypertension    followed by pcp   OSA on CPAP    uses intermittently   Phimosis    Prostate cancer [facility]) urologist-- dr winter   dx 02/ 2021;  localized advanced, gleason 4+4, PSA 317   Renal insufficiency    Sinus tachycardia    Thoracic aortic atherosclerosis (HCC)    Type 2 diabetes mellitus treated with insulin (HCC)    followed by pcp   (05-24-2019  per stated checks blood sugar twice daily, fasting sugar-- 180-200)   Wears glasses     SURGICAL HISTORY: Past Surgical History:  Procedure Laterality Date   CIRCUMCISION N/A 05/29/2019   Procedure: CIRCUMCISION ADULT;  Surgeon:  Rene Paci, MD;  Location: [facility];  Service: Urology;  Laterality: N/A;   CYSTOSCOPY WITH URETHRAL DILATATION N/A 06/08/2021   Procedure: CYSTOSCOPY WITH URETHRAL DILATATION;  Surgeon: Riki Altes, MD;  Location: [facility];  Service: Urology;  Laterality: N/A;   PROSTATE BIOPSY     TONSILLECTOMY  child   TRANSURETHRAL RESECTION OF PROSTATE N/A 05/29/2019   Procedure: TRANSURETHRAL RESECTION OF THE PROSTATE  (TURP)/ CYSTOSCOPY;  Surgeon: Rene Paci, MD;  Location: [facility];  Service: Urology;  Laterality: N/A;    SOCIAL HISTORY: Social History   Socioeconomic History   Marital status: Married    Spouse name: Doris   Number of children: 1   Years of education: 12   Highest education level: GED or equivalent  Occupational History   Not on file  Tobacco Use   Smoking status: Never    Passive exposure: Never   Smokeless tobacco: Never  Vaping Use   Vaping status: Never Used  Substance and Sexual Activity   Alcohol use: Yes    Alcohol/week: 2.0 standard drinks of alcohol    Types: 2 Cans of beer per week    Comment: drinks beer 40 oz x 2 daily  (05-24-2019 per pt no alcohol since 05-03-2019)   Drug use: Yes    Types: Cocaine, Marijuana    Comment: smokes crack cocaine   Sexual activity: Not Currently  Other Topics Concern   Not on file  Social History Narrative   Only child is deceased   Social Determinants of Health   Financial Resource Strain: Not on file  Food Insecurity: No Food Insecurity (04/11/2022)   Hunger Vital Sign    Worried About Running Out of Food in the Last Year: Never true    Ran Out of Food in the Last Year: Never true  Transportation Needs: No Transportation Needs (04/11/2022)   PRAPARE - Administrator, Civil Service (Medical): No    Lack of Transportation (Non-Medical): No  Physical Activity: Not on file  Stress: Not on file  Social Connections: Not on file  Intimate Partner Violence: Not At Risk (04/11/2022)   Humiliation, Afraid, Rape, and Kick questionnaire    Fear of Current or Ex-Partner: No    Emotionally Abused: No    Physically Abused: No    Sexually Abused: No    FAMILY HISTORY: Family History  Problem Relation Age of Onset   Hypertension Mother    Hypertension Father    Diabetes Father    Prostate cancer Maternal Grandfather 35   Breast cancer Neg Hx    Colon cancer Neg Hx    Pancreatic  cancer Neg Hx     ALLERGIES:  has No Known Allergies.  MEDICATIONS:  Current Outpatient Medications  Medication Sig Dispense Refill   abiraterone acetate (ZYTIGA) 250 MG tablet Take 4 tablets (1,000 mg total) by mouth daily. Take on an empty stomach 1 hour before or 2 hours after a meal 120 tablet 0   ACCU-CHEK GUIDE test strip USE UP TO 4 TIMES A DAY AS DIRECTED     Accu-Chek Softclix Lancets lancets SMARTSIG:Topical 1-4 Times Daily     blood glucose meter kit and supplies Dispense based on patient and insurance preference. Use up to four times daily as directed. (FOR ICD-10 E10.9, E11.9). 1 each 0   insulin aspart protamine - aspart (NOVOLOG MIX 70/30 FLEXPEN) (70-30) 100 UNIT/ML FlexPen Inject 20 Units into the skin 2 (two) times  daily. 15 mL 11   Insulin Pen Needle 31G X 5 MM MISC 1 Needle by Does not apply route 2 (two) times daily. 100 each 0   NONFORMULARY OR COMPOUNDED ITEM Trimix (30/1/10)-(Pap/Phent/PGE)  Test Dose  1ml vial   Qty #3 Refills 0  [facility] [phone] Fax [phone] 3 each 0   predniSONE (DELTASONE) 5 MG tablet Take 1 tablet (5 mg total) by mouth daily with breakfast. 90 tablet 1   No current facility-administered medications for this visit.    REVIEW OF SYSTEMS:   Constitutional: ( - ) fevers, ( - )  chills , ( - ) night sweats Eyes: ( - ) blurriness of vision, ( - ) double vision, ( - ) watery eyes Ears, nose, mouth, throat, and face: ( - ) mucositis, ( - ) sore throat Respiratory: ( - ) cough, ( - ) dyspnea, ( - ) wheezes Cardiovascular: ( - ) palpitation, ( - ) chest discomfort, ( - ) lower extremity swelling Gastrointestinal:  ( - ) nausea, ( - ) heartburn, ( - ) change in bowel habits Skin: ( - ) abnormal skin rashes Lymphatics: ( - ) new lymphadenopathy, ( - ) easy bruising Neurological: ( - ) numbness, ( - ) tingling, ( - ) new weaknesses Behavioral/Psych: ( - ) mood change, ( - ) new changes  All other systems were reviewed with  the patient and are negative.  PHYSICAL EXAMINATION:  Vitals:   09/26/22 1450  BP: 127/86  Pulse: 98  Resp: 19  Temp: 97.7 F (36.5 C)  SpO2: 99%   Filed Weights   09/26/22 1450  Weight: 224 lb 3.2 oz (101.7 kg)    GENERAL: Well-appearing middle-aged African-American male, alert, no distress and comfortable SKIN: skin color, texture, turgor are normal, no rashes or significant lesions EYES: conjunctiva are pink and non-injected, sclera clear LUNGS: clear to auscultation and percussion with normal breathing effort HEART: regular rate & rhythm and no murmurs and no lower extremity edema Musculoskeletal: no cyanosis of digits and no clubbing  PSYCH: alert & oriented x 3, fluent speech NEURO: no focal motor/sensory deficits  LABORATORY DATA:  I have reviewed the data as listed    Latest Ref Rng & Units 09/26/2022    2:31 PM 09/16/2022    5:02 PM 09/02/2022    9:17 AM  CBC  WBC 4.0 - 10.5 K/uL 6.1  5.7  3.9   Hemoglobin 13.0 - 17.0 g/dL 29.5  62.1  30.8   Hematocrit 39.0 - 52.0 % 34.0  42.5  39.4   Platelets 150 - 400 K/uL 509  323  327        Latest Ref Rng & Units 09/26/2022    2:31 PM 09/16/2022    5:02 PM 09/02/2022    9:17 AM  CMP  Glucose 70 - 99 mg/dL 657  846  962   BUN 6 - 20 mg/dL 14  14  8    Creatinine 0.61 - 1.24 mg/dL 9.52  8.41  3.24   Sodium 135 - 145 mmol/L 138  133  138   Potassium 3.5 - 5.1 mmol/L 4.2  4.2  3.7   Chloride 98 - 111 mmol/L 104  96  103   CO2 22 - 32 mmol/L 25  27  29    Calcium 8.9 - 10.3 mg/dL 8.9  9.4  9.1   Total Protein 6.5 - 8.1 g/dL 7.1  8.0  6.7   Total Bilirubin 0.3 - 1.2  mg/dL 0.3  0.8  0.4   Alkaline Phos 38 - 126 U/L 74  95  76   AST 15 - 41 U/L 14  23  18    ALT 0 - 44 U/L 16  31  29     RADIOGRAPHIC STUDIES: CT ABDOMEN PELVIS W CONTRAST  Result Date: 09/16/2022 CLINICAL DATA:  History of prostate carcinoma with difficulty urinating, initial encounter EXAM: CT ABDOMEN AND PELVIS WITH CONTRAST TECHNIQUE: Multidetector CT  imaging of the abdomen and pelvis was performed using the standard protocol following bolus administration of intravenous contrast. RADIATION DOSE REDUCTION: This exam was performed according to the departmental dose-optimization program which includes automated exposure control, adjustment of the mA and/or kV according to patient size and/or use of iterative reconstruction technique. CONTRAST:  OMNIPAQUE IOHEXOL 300 MG/ML  SOLN COMPARISON:  05/19/2022 FINDINGS: Lower chest: No acute abnormality. Hepatobiliary: No focal liver abnormality is seen. No gallstones, gallbladder wall thickening, or biliary dilatation. Pancreas: Unremarkable. No pancreatic ductal dilatation or surrounding inflammatory changes. Spleen: Normal in size without focal abnormality. Adrenals/Urinary Tract: Left adrenal gland is within normal limits. Stable right adrenal adenoma is noted. This showed no metabolic activity on recent PET-CT and no further follow-up is recommended. Kidneys show normal enhancement pattern bilaterally. Delayed images demonstrate normal excretion bilaterally. No calculi or obstructive changes are seen. Bladder is decompressed by Foley catheter. Stomach/Bowel: No obstructive or inflammatory changes of the colon are noted. The appendix is within normal limits. Small bowel and stomach are unremarkable. Vascular/Lymphatic: Aortic atherosclerosis. No enlarged abdominal or pelvic lymph nodes. Reproductive: Prostate is stable in appearance from the prior PET-CT. Other: No abdominal wall hernia or abnormality. No abdominopelvic ascites. Musculoskeletal: No acute or significant osseous findings. IMPRESSION: Stable appearance when compared with the prior PET-CT from April of 2024. No acute abnormality is noted. Electronically Signed   By: Alcide Clever M.D.   On: 09/16/2022 19:51    ASSESSMENT & PLAN Jonathan Austin 57 y.o. male with medical history significant for metastatic castrate sensitive prostate cancer who  presents for a follow up visit.  After review of the labs, review of the records, and discussion with the patient the patients findings are most consistent with castrate sensitive prostate cancer with metastatic spread to the lymph nodes.   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 First dose on 08/08/2022. Next due in late Sept/ early Oct 2024.  -- labs today show WBC 6.1, Hgb 11.4, MCV 89, Plt 509. PSA on 09/16/2022 down to 0.4.  --RTC in 4 weeks to assure tolerance of abiraterone.  After 3 months on the medication we will transition to acute 12-week visits to coincide with his Lupron shots.   #Polysubstance Abuse -- patient drinks 6 pack of beer daily  -- patient smokes 1 g of crack cocaine daily -- discussed cutting back/cessation of substances today    No orders of the defined types were placed in this encounter.   All questions were answered. The patient knows to call the clinic with any problems, questions or concerns.  A total of more than 30 minutes were spent on this encounter with face-to-face time and non-face-to-face time, including preparing to see the patient, ordering tests and/or medications, counseling the patient and coordination of care as outlined above.  Ulysees Barns, MD Department of Hematology/Oncology [facility] Phone: [phone] Pager: [phone] Email: [email or web address]  09/26/2022 3:39 PM

## 2022-09-26 NOTE — Telephone Encounter (Signed)
Spoke with pt via phone in response to letter sent to address on file regarding (+) urine culture.  Called in Cephalexin 500mg  PO BID x 5 days to [facility], [facility] [phone]

## 2022-09-30 ENCOUNTER — Other Ambulatory Visit (HOSPITAL_COMMUNITY): Payer: Self-pay

## 2022-10-03 ENCOUNTER — Ambulatory Visit (INDEPENDENT_AMBULATORY_CARE_PROVIDER_SITE_OTHER): Payer: MEDICAID | Admitting: Urology

## 2022-10-03 ENCOUNTER — Encounter: Payer: Self-pay | Admitting: Urology

## 2022-10-03 VITALS — BP 129/82 | HR 89 | Ht 67.0 in | Wt 240.0 lb

## 2022-10-03 DIAGNOSIS — N3946 Mixed incontinence: Secondary | ICD-10-CM

## 2022-10-03 LAB — MICROSCOPIC EXAMINATION: RBC, Urine: >30 /hpf — AB (ref 0–2)

## 2022-10-03 LAB — URINALYSIS, COMPLETE
Bilirubin, UA: NEGATIVE
Ketones, UA: NEGATIVE
Nitrite, UA: POSITIVE — AB
Specific Gravity, UA: 1.02 (ref 1.005–1.030)
Urobilinogen, Ur: 0.2 mg/dL (ref 0.2–1.0)
pH, UA: 6 (ref 5.0–7.5)

## 2022-10-03 NOTE — Progress Notes (Unsigned)
10/04/2022 4:35 PM   [name] [birth year] [id number]  Referring provider: Irven Coe, MD [address] [place],  [place] [address]  Urological history: 1. 1. ED Contributing factors of age, BPH, diabetes, hypertension, depression, polysubstance abuse and hyperlipidemia failed PDE5i's    2.  Prostate cancer PSA (08/2022) 0.4 High risk prostate cancer diagnosed [facility] [place] 2021; PSA 317, 12/12 cores positive Gleason 4+4 adenocarcinoma with extensive extraprostatic extension and perineural invasion; CT suspicious for lymphadenopathy; no distant mets Significant outlet obstruction pretreatment and underwent channel TURP by Dr. Liliane Shi 05/2019 Completed IMRT 10/22/2019 Was on ADT however did not get his last injection due to loss of insurance 05/10/2022: CT abdomen/pelvis w/ contrast showed stable exam. No evidence of metastatic disease within the abdomen or pelvis. 05/19/2022: NM PET (PSMA) showed focus of radiotracer accumulation in posterior prostatic apex, consistent with recurrent prostate carcinoma as well as sub-centimeter abdominal retroperitoneal lymph nodes show radiotracer accumulation, consistent with lymph node metastases 08/01/2022: establish care with Dr. Leonides Schanz.  Started Zytiga 1000 mg p.o. daily with prednisone 5 mg p.o. daily   3.  Urinary incontinence Both stress and urge incontinence post radiation No improvement on Solifenacin, Myrbetriq Wears depends    4.  Bulbar/prostatic urethral stricture Dilation 06/08/2021  Chief Complaint  Patient presents with   voiding trial   HPI: JHAEL SHIPPEE is a 57 y.o. male who presents today for voiding trial  Previous records reviewed.  He is being followed by [facility] for metastatic castrate sensitive prostate cancer.    Foley was placed in the ED on 09/16/2022.    His Foley catheter was removed this a.m.  He went home and drink fluid and has been urinating freely without issues.  He has had some  blood in his urine since catheter was placed.  Patient denies any modifying or aggravating factors.  Patient denies any recent UTI's, dysuria or suprapubic/flank pain.  Patient denies any fevers, chills, nausea or vomiting.   PVR 84 mL   PMH: Past Medical History:  Diagnosis Date   Alcohol use disorder, severe, dependence (HCC)    05-24-2019 recent [facility] admission for suicide ideation and alcohol / cocaine detox 05-04-2019 in epic,  pt stated has been taking naltrexone daily as prescribed since and no alcohol since   Cocaine abuse (HCC)    05-24-2019  pt had recent [facility] admission, pt stated last used  "crack" 05-03-2019   Cocaine abuse with cocaine-induced mood disorder (HCC)    Depression    GERD (gastroesophageal reflux disease)    History of suicidal ideation    multiple [facility] admission's;  last one admitted 05-04-2019   Hyperplasia of prostate with lower urinary tract symptoms (LUTS)    Hypertension    followed by pcp   OSA on CPAP    uses intermittently   Phimosis    Prostate cancer [facility]) urologist-- dr winter   dx 02/ 2021;  localized advanced, gleason 4+4, PSA 317   Renal insufficiency    Sinus tachycardia    Thoracic aortic atherosclerosis (HCC)    Type 2 diabetes mellitus treated with insulin (HCC)    followed by pcp   (05-24-2019  per stated checks blood sugar twice daily, fasting sugar-- 180-200)   Wears glasses     Surgical History: Past Surgical History:  Procedure Laterality Date   CIRCUMCISION N/A 05/29/2019   Procedure: CIRCUMCISION ADULT;  Surgeon: Rene Paci, MD;  Location: [facility];  Service: Urology;  Laterality: N/A;   CYSTOSCOPY WITH URETHRAL DILATATION N/A 06/08/2021   Procedure: CYSTOSCOPY WITH URETHRAL DILATATION;  Surgeon: Riki Altes, MD;  Location: [facility];  Service: Urology;  Laterality: N/A;   PROSTATE BIOPSY     TONSILLECTOMY  child   TRANSURETHRAL RESECTION OF PROSTATE N/A 05/29/2019   Procedure: TRANSURETHRAL  RESECTION OF THE PROSTATE (TURP)/ CYSTOSCOPY;  Surgeon: Rene Paci, MD;  Location: [facility];  Service: Urology;  Laterality: N/A;    Home Medications:  Allergies as of 10/04/2022   No Known Allergies      Medication List        Accurate as of October 04, 2022  4:35 PM. If you have any questions, ask your nurse or doctor.          abiraterone acetate 250 MG tablet Commonly known as: ZYTIGA Take 4 tablets (1,000 mg total) by mouth daily. Take on an empty stomach 1 hour before or 2 hours after a meal   Accu-Chek Guide test strip Generic drug: glucose blood USE UP TO 4 TIMES A DAY AS DIRECTED   Accu-Chek Softclix Lancets lancets SMARTSIG:Topical 1-4 Times Daily   blood glucose meter kit and supplies Dispense based on patient and insurance preference. Use up to four times daily as directed. (FOR ICD-10 E10.9, E11.9).   Insulin Pen Needle 31G X 5 MM Misc 1 Needle by Does not apply route 2 (two) times daily.   NONFORMULARY OR COMPOUNDED ITEM Trimix (30/1/10)-(Pap/Phent/PGE)  Test Dose  1ml vial   Qty #3 Refills 0  [facility] [phone] Fax [phone]   NovoLOG Mix 70/30 FlexPen (70-30) 100 UNIT/ML FlexPen Generic drug: insulin aspart protamine - aspart Inject 20 Units into the skin 2 (two) times daily.   predniSONE 5 MG tablet Commonly known as: DELTASONE Take 1 tablet (5 mg total) by mouth daily with breakfast.        Allergies: No Known Allergies  Family History: Family History  Problem Relation Age of Onset   Hypertension Mother    Hypertension Father    Diabetes Father    Prostate cancer Maternal Grandfather 91   Breast cancer Neg Hx    Colon cancer Neg Hx    Pancreatic cancer Neg Hx     Social History:  reports that he has never smoked. He has never been exposed to tobacco smoke. He has never used smokeless tobacco. He reports current alcohol use of about 2.0 standard drinks of alcohol per week. He  reports current drug use. Drugs: Cocaine and Marijuana.  ROS: Pertinent ROS in HPI  Physical Exam: BP 132/84   Pulse 79   Wt 240 lb (108.9 kg)   BMI 37.59 kg/m   Constitutional:  Well nourished. Alert and oriented, No acute distress. HEENT: [place] AT, moist mucus membranes.  Trachea midline Cardiovascular: No clubbing, cyanosis, or edema. Respiratory: Normal respiratory effort, no increased work of breathing. Neurologic: Grossly intact, no focal deficits, moving all 4 extremities. Psychiatric: Normal mood and affect.  Laboratory Data: Lab Results  Component Value Date   WBC 6.1 09/26/2022   HGB 11.4 (L) 09/26/2022   HCT 34.0 (L) 09/26/2022   MCV 89.0 09/26/2022   PLT 509 (H) 09/26/2022   Lab Results  Component Value Date   CREATININE 0.93 09/26/2022   Lab Results  Component Value Date   TESTOSTERONE <3 (L) 09/02/2022    Lab Results  Component Value Date   HGBA1C 11.8 (H) 04/11/2022      Component  Value Date/Time   CHOL 206 (H) 04/13/2022 0621   HDL 43 04/13/2022 0621   CHOLHDL 4.8 04/13/2022 0621   VLDL 22 04/13/2022 0621   LDLCALC 141 (H) 04/13/2022 0621   Lab Results  Component Value Date   AST 14 (L) 09/26/2022   Lab Results  Component Value Date   ALT 16 09/26/2022   Urinalysis    Component Value Date/Time   COLORURINE YELLOW 09/16/2022 1702   APPEARANCEUR Hazy (A) 10/03/2022 1433   LABSPEC 1.025 09/16/2022 1702   PHURINE 6.0 09/16/2022 1702   GLUCOSEU 3+ (A) 10/03/2022 1433   HGBUR SMALL (A) 09/16/2022 1702   BILIRUBINUR Negative 10/03/2022 1433   KETONESUR NEGATIVE 09/16/2022 1702   PROTEINUR 1+ (A) 10/03/2022 1433   PROTEINUR NEGATIVE 09/16/2022 1702   UROBILINOGEN 0.2 12/17/2014 1649   NITRITE Positive (A) 10/03/2022 1433   NITRITE NEGATIVE 09/16/2022 1702   LEUKOCYTESUR 1+ (A) 10/03/2022 1433   LEUKOCYTESUR TRACE (A) 09/16/2022 1702  I have reviewed the labs.   Pertinent Imaging:  10/04/22 15:54  Scan Result 84 ml     Assessment  & Plan:    1. Urinary retention -Foley removed this am for TOV -PVR demonstrated adequate emptying   2. Prostate cancer -has follow up with Dr. Lonna Cobb in September  3. Mixed incontinence -has follow up with Dr. Sherron Monday in October    Return for keep follow up appointments .  These notes generated with voice recognition software. I apologize for typographical errors.  Cloretta Ned  [facility] [address]  [address] [place], [place] [address] [phone]

## 2022-10-03 NOTE — Progress Notes (Addendum)
10/03/2022 2:41 PM   [name] 06-09-1965 [id number]  Referring provider: Irven Coe, MD 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  Transurethral resection of prostate April 2021 by Dr. Liliane Shi.  Has had a radiation.  Recent PSA 0.1.  Has failed Vesicare and Myrbetriq for mixed incontinence.  In May 2023 had a urethral stricture dilated.  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  He had radiation changes in his bladder.   A few years ago he was seeing Dr. Laverle Patter.  He had positive lymph nodes.  It was recommended to have transurethral resection of the prostate prior to planned radiation for his significant lower urinary tract symptoms.  It was felt that he had locally advanced disease causing some of the symptoms.  According to Dr. Liliane Shi he had bothersome urgency frequency and a poor flow in spite of Flomax prior to surgery or he had a channel TURP and circumcision.  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  Prior to dilation he had worsening lower urinary tract symptoms including urgency and incontinence.  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  I think he has some rigidity in the prostatic urethra.  He had radiation changes.   Currently leaks a small amount with coughing sneezing bending lifting.  He leaks more when he is active.  Primary problem is urge incontinence.  He has moderately severe bedwetting.  He can soak 3-4 pads a day.  He said he was continent after the transurethral resection of the prostate and only started to leak a few months prior to the urethral dilation.  I do not see any urodynamics done prior to the transurethral section of the prostate.  I did not see bladder capacity  measured under anesthesia  He voids every 1 hour.  No nocturia.  Flow was good.  Right-handed and has had a hernia repair.  He is an insulin-dependent diabetic.  He has failed Vesicare and Myrbetriq.  He had shoulder penis in a standing position I think he did have a positive cough test.  Male genitalia otherwise normal     Patient has mixed incontinence.  He has bedwetting.  On suspect that he has a small spastic bladder.  He likely may also have urethral insufficiency which is less common postradiation and transurethral section of the prostate.  He has had a stricture which would make it sphincter more difficult to place in the bulbar urethra.  He may need repeat cystoscopy in the future.  Call if culture positive.  Order urodynamics and proceed accordingly.  He may need to be supine his bladder neck occluded with a small catheter to measure his bladder capacity if he starts leaking a lot at low volumes during urodynamics    Today When the patient saw the nurse practitioner recently he apparently had no insurance and therefore could not undergo urodynamics and cystoscopy. Patient still has high-volume mixed incontinence especially when his sugars are out of control.  He is on insulin.  He has sleep apnea.  He now has Medicaid and would like to organize the urodynamics.  Clinically not infected.   I urged him to follow-up with the cancer center in [facility]  for his ongoing prostate cancer surveillance and treatment   Follow-up with urodynamics and proceed accordingly.  Currently his flow was good.  I am suspect that he has a small capacity overactive bladder in combination with urethral insufficiency   TOday I last saw the patient in February 2024 and I do not think he had urodynamics for insurance reasons so had them at [facility].  In May he was seen by our nurse practitioner.  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   Referred to cancer center for further evaluation.  It appears the patient went to the emergency room September 16, 2022 difficulty voiding.  He was having retention symptoms.  He was on immunotherapy.  Fully catheter was placed for 450 mL. CT scan demonstrated normal kidneys with no hydronephrosis.  Turns out he had a pop of culture that was treated and he will finish antibiotics tomorrow  Patient had urodynamics.  He voided 258 mL with a maximal flow of 33 mL/s and his residual is 3 mL.  Maximum bladder capacity was 256 mL.  He had increased bladder sensation.  Bladder was stable.  At lower volumes his leak point pressure was 102 cm water.  At higher volumes range between 140 and 163 cm of water.  During voiding he did well sustain detrusor contraction at 54 cm of water and acute backs at 9.5 mL/s.  EMG activity normal.           PMH: Past Medical History:  Diagnosis Date   Alcohol use disorder, severe, dependence (HCC)    05-24-2019 recent [facility] admission for suicide ideation and alcohol / cocaine detox 05-04-2019 in epic,  pt stated has been taking naltrexone daily as prescribed since and no alcohol since   Cocaine abuse (HCC)    05-24-2019  pt had recent [facility] admission, pt stated last used  "crack" 05-03-2019   Cocaine abuse with cocaine-induced mood disorder (HCC)    Depression    GERD (gastroesophageal reflux disease)    History of suicidal ideation    multiple [facility] admission's;  last one admitted 05-04-2019   Hyperplasia of prostate with lower urinary tract symptoms (LUTS)    Hypertension    followed by pcp   OSA on CPAP    uses intermittently   Phimosis    Prostate cancer [facility]) urologist-- dr winter   dx 02/ 2021;  localized advanced, gleason 4+4, PSA 317   Renal insufficiency    Sinus tachycardia    Thoracic aortic atherosclerosis (HCC)    Type 2 diabetes mellitus treated with insulin (HCC)    followed by pcp   (05-24-2019  per stated checks blood sugar twice daily, fasting  sugar-- 180-200)   Wears glasses     Surgical History: Past Surgical History:  Procedure Laterality Date   CIRCUMCISION N/A 05/29/2019   Procedure: CIRCUMCISION ADULT;  Surgeon: Rene Paci, MD;  Location: [facility];  Service: Urology;  Laterality: N/A;   CYSTOSCOPY WITH URETHRAL DILATATION N/A 06/08/2021   Procedure: CYSTOSCOPY WITH URETHRAL DILATATION;  Surgeon: Riki Altes, MD;  Location: [facility];  Service: Urology;  Laterality: N/A;   PROSTATE BIOPSY     TONSILLECTOMY  child   TRANSURETHRAL RESECTION OF PROSTATE N/A 05/29/2019   Procedure: TRANSURETHRAL RESECTION OF THE PROSTATE (TURP)/ CYSTOSCOPY;  Surgeon: Rene Paci, MD;  Location: [name];  Service: Urology;  Laterality: N/A;  Home Medications:  Allergies as of 10/03/2022   No Known Allergies      Medication List        Accurate as of October 03, 2022  2:41 PM. If you have any questions, ask your nurse or doctor.          abiraterone acetate 250 MG tablet Commonly known as: ZYTIGA Take 4 tablets (1,000 mg total) by mouth daily. Take on an empty stomach 1 hour before or 2 hours after a meal   Accu-Chek Guide test strip Generic drug: glucose blood USE UP TO 4 TIMES A DAY AS DIRECTED   Accu-Chek Softclix Lancets lancets SMARTSIG:Topical 1-4 Times Daily   blood glucose meter kit and supplies Dispense based on patient and insurance preference. Use up to four times daily as directed. (FOR ICD-10 E10.9, E11.9).   Insulin Pen Needle 31G X 5 MM Misc 1 Needle by Does not apply route 2 (two) times daily.   NONFORMULARY OR COMPOUNDED ITEM Trimix (30/1/10)-(Pap/Phent/PGE)  Test Dose  1ml vial   Qty #3 Refills 0  [facility] [phone] Fax [phone]   NovoLOG Mix 70/30 FlexPen (70-30) 100 UNIT/ML FlexPen Generic drug: insulin aspart protamine - aspart Inject 20 Units into the skin 2 (two) times daily.   predniSONE 5 MG  tablet Commonly known as: DELTASONE Take 1 tablet (5 mg total) by mouth daily with breakfast.        Allergies: No Known Allergies  Family History: Family History  Problem Relation Age of Onset   Hypertension Mother    Hypertension Father    Diabetes Father    Prostate cancer Maternal Grandfather 93   Breast cancer Neg Hx    Colon cancer Neg Hx    Pancreatic cancer Neg Hx     Social History:  reports that he has never smoked. He has never been exposed to tobacco smoke. He has never used smokeless tobacco. He reports current alcohol use of about 2.0 standard drinks of alcohol per week. He reports current drug use. Drugs: Cocaine and Marijuana.  ROS:                                        Physical Exam: There were no vitals taken for this visit.  Constitutional:  Alert and oriented, No acute distress. HEENT: [place] AT, moist mucus membranes.  Trachea midline, no masses.   Laboratory Data: Lab Results  Component Value Date   WBC 6.1 09/26/2022   HGB 11.4 (L) 09/26/2022   HCT 34.0 (L) 09/26/2022   MCV 89.0 09/26/2022   PLT 509 (H) 09/26/2022    Lab Results  Component Value Date   CREATININE 0.93 09/26/2022    No results found for: "PSA"  Lab Results  Component Value Date   TESTOSTERONE <3 (L) 09/02/2022    Lab Results  Component Value Date   HGBA1C 11.8 (H) 04/11/2022    Urinalysis    Component Value Date/Time   COLORURINE YELLOW 09/16/2022 1702   APPEARANCEUR HAZY (A) 09/16/2022 1702   APPEARANCEUR Clear 03/28/2022 0902   LABSPEC 1.025 09/16/2022 1702   PHURINE 6.0 09/16/2022 1702   GLUCOSEU >=500 (A) 09/16/2022 1702   HGBUR SMALL (A) 09/16/2022 1702   BILIRUBINUR NEGATIVE 09/16/2022 1702   BILIRUBINUR Negative 03/28/2022 0902   KETONESUR NEGATIVE 09/16/2022 1702   PROTEINUR NEGATIVE 09/16/2022 1702   UROBILINOGEN 0.2 12/17/2014 1649   NITRITE  NEGATIVE 09/16/2022 1702   LEUKOCYTESUR TRACE (A) 09/16/2022 1702    Pertinent  Imaging:   Assessment & Plan: Patient has mixed incontinence and a complicated presentation with no quick treatment path.  He understands that approximately 70% of his problem is a small capacity overactive bladder resulting in urge incontinence and bedwetting.  He has mild urethral insufficiency causing the stress incontinence.  Placing artificial sphincter may not help the bedwetting or overactive bladder component, he recently had a urinary tract infection with retention, and a bulbar urethral stricture that would make placement of the sphincter more challenging or complicating in the future.  .  She saw the medical oncologist and started new immunotherapy in the last 4 to 6 weeks in [place].  I want him to have a trial of voiding tomorrow and another urine could be sent for culture.  I would like to have him see Dr. Lonna Cobb to make certain that his prostate cancer is appropriately managed.  He might benefit from cystoscopy but the catheter went in still likely if he has a stricture it is mild.  We want to make certain that his residuals remain low.  My neck step would be to try [facility] but this could complicate retention risks.  Third line overactive bladder treatment is something to consider and except the stress incontinence.  Gets more challenging for the patient to understand the complexity of his incontinence issue and we will continue to explain it to him moving forward. Patient understand the stepwise approach  1. Mixed stress and urge urinary incontinence  - Urinalysis, Complete   No follow-ups on file.  Martina Sinner, MD  [facility] [address] [place], [place] [address] [phone]

## 2022-10-04 ENCOUNTER — Encounter: Payer: Self-pay | Admitting: Urology

## 2022-10-04 ENCOUNTER — Encounter: Payer: Self-pay | Admitting: Hematology and Oncology

## 2022-10-04 ENCOUNTER — Ambulatory Visit (INDEPENDENT_AMBULATORY_CARE_PROVIDER_SITE_OTHER): Payer: MEDICAID | Admitting: Urology

## 2022-10-04 ENCOUNTER — Ambulatory Visit: Payer: MEDICAID | Admitting: Urology

## 2022-10-04 VITALS — BP 132/84 | HR 79 | Wt 240.0 lb

## 2022-10-04 DIAGNOSIS — N3946 Mixed incontinence: Secondary | ICD-10-CM

## 2022-10-04 DIAGNOSIS — C61 Malignant neoplasm of prostate: Secondary | ICD-10-CM

## 2022-10-04 DIAGNOSIS — R338 Other retention of urine: Secondary | ICD-10-CM | POA: Diagnosis not present

## 2022-10-04 LAB — BLADDER SCAN AMB NON-IMAGING: Scan Result: 84

## 2022-10-14 ENCOUNTER — Ambulatory Visit: Payer: MEDICAID | Admitting: Urology

## 2022-10-21 ENCOUNTER — Other Ambulatory Visit (HOSPITAL_COMMUNITY): Payer: Self-pay

## 2022-10-21 ENCOUNTER — Other Ambulatory Visit: Payer: Self-pay | Admitting: Hematology and Oncology

## 2022-10-21 ENCOUNTER — Other Ambulatory Visit: Payer: Self-pay

## 2022-10-21 MED ORDER — ABIRATERONE ACETATE 250 MG PO TABS
1000.0000 mg | ORAL_TABLET | Freq: Every day | ORAL | 0 refills | Status: DC
  Filled 2022-10-21: qty 120, 30d supply, fill #0

## 2022-10-24 ENCOUNTER — Inpatient Hospital Stay: Payer: MEDICAID | Attending: Hematology and Oncology | Admitting: Licensed Clinical Social Worker

## 2022-10-24 DIAGNOSIS — F1410 Cocaine abuse, uncomplicated: Secondary | ICD-10-CM | POA: Insufficient documentation

## 2022-10-24 DIAGNOSIS — C61 Malignant neoplasm of prostate: Secondary | ICD-10-CM | POA: Insufficient documentation

## 2022-10-24 DIAGNOSIS — F1910 Other psychoactive substance abuse, uncomplicated: Secondary | ICD-10-CM | POA: Insufficient documentation

## 2022-10-24 DIAGNOSIS — C772 Secondary and unspecified malignant neoplasm of intra-abdominal lymph nodes: Secondary | ICD-10-CM | POA: Insufficient documentation

## 2022-10-24 NOTE — Progress Notes (Signed)
[facility] CSW Progress Note  Visual merchandiser  received a call from pt inquiring about how to access his [employer] funds.  Pt applied for a second [employer] in June and states he still has funds available that he would like to use towards home bills.  CSW provided pt w/ the contact number for the patient financial resource specialist to arrange a day to bring in his home bills as well as find out the remaining available funds.  CSW provided pt w/ contact information for the [employer] and [employer] for additional financial assistance.  CSW to remain available to provide support throughout duration of treatment.        Rachel Moulds, LCSW Clinical Social Worker [facility]    Patient is participating in a Managed Medicaid Plan:  Yes

## 2022-10-25 ENCOUNTER — Other Ambulatory Visit (HOSPITAL_COMMUNITY): Payer: Self-pay

## 2022-10-31 ENCOUNTER — Encounter: Payer: Self-pay | Admitting: Hematology and Oncology

## 2022-10-31 ENCOUNTER — Other Ambulatory Visit: Payer: Self-pay | Admitting: Hematology and Oncology

## 2022-10-31 ENCOUNTER — Inpatient Hospital Stay: Payer: MEDICAID

## 2022-10-31 ENCOUNTER — Inpatient Hospital Stay (HOSPITAL_BASED_OUTPATIENT_CLINIC_OR_DEPARTMENT_OTHER): Payer: MEDICAID | Admitting: Hematology and Oncology

## 2022-10-31 VITALS — BP 135/79 | HR 97 | Temp 97.4°F | Resp 16 | Wt 225.6 lb

## 2022-10-31 DIAGNOSIS — F1910 Other psychoactive substance abuse, uncomplicated: Secondary | ICD-10-CM | POA: Diagnosis not present

## 2022-10-31 DIAGNOSIS — C772 Secondary and unspecified malignant neoplasm of intra-abdominal lymph nodes: Secondary | ICD-10-CM | POA: Diagnosis present

## 2022-10-31 DIAGNOSIS — C61 Malignant neoplasm of prostate: Secondary | ICD-10-CM

## 2022-10-31 DIAGNOSIS — F1410 Cocaine abuse, uncomplicated: Secondary | ICD-10-CM | POA: Diagnosis not present

## 2022-10-31 LAB — CBC WITH DIFFERENTIAL (CANCER CENTER ONLY)
Abs Immature Granulocytes: 0.02 10*3/uL (ref 0.00–0.07)
Basophils Absolute: 0 10*3/uL (ref 0.0–0.1)
Basophils Relative: 0 %
Eosinophils Absolute: 0.1 10*3/uL (ref 0.0–0.5)
Eosinophils Relative: 1 %
HCT: 36.2 % — ABNORMAL LOW (ref 39.0–52.0)
Hemoglobin: 12.3 g/dL — ABNORMAL LOW (ref 13.0–17.0)
Immature Granulocytes: 0 %
Lymphocytes Relative: 25 %
Lymphs Abs: 1.2 10*3/uL (ref 0.7–4.0)
MCH: 29.8 pg (ref 26.0–34.0)
MCHC: 34 g/dL (ref 30.0–36.0)
MCV: 87.7 fL (ref 80.0–100.0)
Monocytes Absolute: 0.5 10*3/uL (ref 0.1–1.0)
Monocytes Relative: 9 %
Neutro Abs: 3.1 10*3/uL (ref 1.7–7.7)
Neutrophils Relative %: 65 %
Platelet Count: 329 10*3/uL (ref 150–400)
RBC: 4.13 MIL/uL — ABNORMAL LOW (ref 4.22–5.81)
RDW: 12.8 % (ref 11.5–15.5)
WBC Count: 4.9 10*3/uL (ref 4.0–10.5)
nRBC: 0 % (ref 0.0–0.2)

## 2022-10-31 LAB — CMP (CANCER CENTER ONLY)
ALT: 22 U/L (ref 0–44)
AST: 14 U/L — ABNORMAL LOW (ref 15–41)
Albumin: 4.1 g/dL (ref 3.5–5.0)
Alkaline Phosphatase: 77 U/L (ref 38–126)
Anion gap: 6 (ref 5–15)
BUN: 17 mg/dL (ref 6–20)
CO2: 29 mmol/L (ref 22–32)
Calcium: 10 mg/dL (ref 8.9–10.3)
Chloride: 102 mmol/L (ref 98–111)
Creatinine: 1.02 mg/dL (ref 0.61–1.24)
GFR, Estimated: >60 mL/min (ref >60)
Glucose, Bld: 189 mg/dL — ABNORMAL HIGH (ref 70–99)
Potassium: 4.8 mmol/L (ref 3.5–5.1)
Sodium: 137 mmol/L (ref 135–145)
Total Bilirubin: 0.5 mg/dL (ref 0.3–1.2)
Total Protein: 7.7 g/dL (ref 6.5–8.1)

## 2022-10-31 MED ORDER — LEUPROLIDE ACETATE (3 MONTH) 22.5 MG ~~LOC~~ KIT
22.5000 mg | PACK | Freq: Once | SUBCUTANEOUS | Status: AC
  Administered 2022-10-31: 22.5 mg via SUBCUTANEOUS
  Filled 2022-10-31: qty 22.5

## 2022-10-31 NOTE — Progress Notes (Signed)
Patient was approved for one-time $1000 [employer] grant to assist with personal household expenses and one-time $200 [employer] grant for medication and gas cards only on 08/08/22. He completed the paperwork and has a copy of the approval letter and expense sheet.  He has my card for any additional financial questions or concerns.

## 2022-10-31 NOTE — Patient Instructions (Signed)

## 2022-10-31 NOTE — Progress Notes (Signed)
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 Gleason 4+4 adenocarcinoma 05/2019: TURP with Dr. Liliane Shi 10/22/2019: completed IMRT 05/10/2022: CT abdomen/pelvis w/ contrast showed stable exam. No evidence of metastatic disease within the abdomen or pelvis. 05/19/2022: NM PET (PSMA) showed focus of radiotracer accumulation in posterior prostatic apex, consistent with recurrent prostate carcinoma as well as sub-centimeter abdominal retroperitoneal lymph nodes show radiotracer accumulation, consistent with lymph node metastases 08/01/2022: establish care with Dr. Leonides Schanz.  Started Zytiga 1000 mg p.o. daily with prednisone 5 mg p.o. daily  Interval History:  Jonathan Austin 57 y.o. male with medical history significant for metastatic castrate sensitive prostate cancer who presents for a follow up visit. The patient's last visit was on 09/26/2022. In the interim since the last visit he has had no major changes in his health and continues on his Zytiga pills.  On exam today Jonathan Austin is accompanied by his wife.  He reports he is faithfully taking his Zytiga 1000 mg p.o. daily with a steroid pill.  He is not having any side effects other than hot flashes.  He reports that the hot flashes last for about 2 to 3 minutes and he is able to cool off quickly.  He notes he is not having any nausea, vomiting, or diarrhea.  He is having some numbness of his feet and reports that typically it occurs when he wakes up but improves as he is walking.  He reports he is eating well with a strong energy of 10 out of 10.  He notes he does have some occasional itching in his underarm area and thinks it may be due to  ingrown hairs.  Jonathan Austin reports otherwise he is willing and able to proceed with treatment today and that he feels quite well.  He otherwise denies any fevers, chills, sweats, nausea, vomiting or diarrhea.  A full 10 point ROS is otherwise negative.  MEDICAL HISTORY:  Past Medical History:  Diagnosis Date   Alcohol use disorder, severe, dependence (HCC)    05-24-2019 recent 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)    Hypertension    followed by pcp   OSA on CPAP    uses intermittently   Phimosis    Prostate cancer [facility]) urologist-- dr winter   dx 02/ 2021;  localized advanced, gleason 4+4, PSA 317   Renal insufficiency    Sinus tachycardia    Thoracic aortic atherosclerosis (HCC)    Type 2 diabetes mellitus treated with insulin (HCC)    followed by pcp   (05-24-2019  per stated checks blood sugar twice daily, fasting sugar-- 180-200)   Wears glasses     SURGICAL HISTORY: Past Surgical History:  Procedure Laterality Date   CIRCUMCISION N/A 05/29/2019   Procedure: CIRCUMCISION ADULT;  Surgeon: Rene Paci, MD;  Location: [facility];  Service: Urology;  Laterality: N/A;   CYSTOSCOPY WITH URETHRAL DILATATION N/A 06/08/2021   Procedure: CYSTOSCOPY WITH URETHRAL DILATATION;  Surgeon: Riki Altes, MD;  Location: [facility];  Service: Urology;  Laterality: N/A;   PROSTATE BIOPSY     TONSILLECTOMY  child   TRANSURETHRAL RESECTION OF PROSTATE N/A 05/29/2019    Procedure: TRANSURETHRAL RESECTION OF THE PROSTATE (TURP)/ CYSTOSCOPY;  Surgeon: Rene Paci, MD;  Location: [facility];  Service: Urology;  Laterality: N/A;    SOCIAL HISTORY: Social History   Socioeconomic History   Marital status: Married    Spouse name: Doris   Number of children: 1   Years of education: 12   Highest education level: GED or equivalent  Occupational History   Not on file  Tobacco Use   Smoking status: Never    Passive exposure: Never   Smokeless tobacco: Never  Vaping Use   Vaping status: Never Used  Substance and Sexual Activity   Alcohol use: Yes    Alcohol/week: 2.0 standard drinks of alcohol    Types: 2 Cans of beer per week    Comment: drinks beer 40 oz x 2 daily  (05-24-2019 per pt no alcohol since 05-03-2019)   Drug use: Yes    Types: Cocaine, Marijuana    Comment: smokes crack cocaine   Sexual activity: Not Currently  Other Topics Concern   Not on file  Social History Narrative   Only child is deceased   Social Determinants of Health   Financial Resource Strain: Not on file  Food Insecurity: No Food Insecurity (04/11/2022)   Hunger Vital Sign    Worried About Running Out of Food in the Last Year: Never true    Ran Out of Food in the Last Year: Never true  Transportation Needs: No Transportation Needs (04/11/2022)   PRAPARE - Administrator, Civil Service (Medical): No    Lack of Transportation (Non-Medical): No  Physical Activity: Not on file  Stress: Not on file  Social Connections: Not on file  Intimate Partner Violence: Not At Risk (04/11/2022)   Humiliation, Afraid, Rape, and Kick questionnaire    Fear of Current or Ex-Partner: No    Emotionally Abused: No    Physically Abused: No    Sexually Abused: No    FAMILY HISTORY: Family History  Problem Relation Age of Onset   Hypertension Mother    Hypertension Father    Diabetes Father    Prostate cancer Maternal Grandfather 64   Breast cancer  Neg Hx    Colon cancer Neg Hx    Pancreatic cancer Neg Hx     ALLERGIES:  has No Known Allergies.  MEDICATIONS:  Current Outpatient Medications  Medication Sig Dispense Refill   abiraterone acetate (ZYTIGA) 250 MG tablet Take 4 tablets (1,000 mg total) by mouth daily. Take on an empty stomach 1 hour before or 2 hours after a meal 120 tablet 0   ACCU-CHEK GUIDE test strip USE UP TO 4 TIMES A DAY AS DIRECTED     Accu-Chek Softclix Lancets lancets SMARTSIG:Topical 1-4 Times Daily     blood glucose meter kit and supplies Dispense based on patient and insurance preference. Use up to four times daily as directed. (FOR ICD-10 E10.9, E11.9). 1 each 0   insulin aspart protamine - aspart (NOVOLOG MIX  70/30 FLEXPEN) (70-30) 100 UNIT/ML FlexPen Inject 20 Units into the skin 2 (two) times daily. 15 mL 11   Insulin Pen Needle 31G X 5 MM MISC 1 Needle by Does not apply route 2 (two) times daily. 100 each 0   NONFORMULARY OR COMPOUNDED ITEM Trimix (30/1/10)-(Pap/Phent/PGE)  Test Dose  1ml vial   Qty #3 Refills 0  [facility] [phone] Fax [phone] 3 each 0   predniSONE (DELTASONE) 5 MG tablet Take 1 tablet (5 mg total) by mouth daily with breakfast. 90 tablet 1   No current facility-administered medications for this visit.   Facility-Administered Medications Ordered in Other Visits  Medication Dose Route Frequency Provider Last Rate Last Admin   Leuprolide Acetate (3 Month) (ELIGARD) 22.5 MG injection 22.5 mg  22.5 mg Subcutaneous Once Jaci Standard, MD        REVIEW OF SYSTEMS:   Constitutional: ( - ) fevers, ( - )  chills , ( - ) night sweats Eyes: ( - ) blurriness of vision, ( - ) double vision, ( - ) watery eyes Ears, nose, mouth, throat, and face: ( - ) mucositis, ( - ) sore throat Respiratory: ( - ) cough, ( - ) dyspnea, ( - ) wheezes Cardiovascular: ( - ) palpitation, ( - ) chest discomfort, ( - ) lower extremity swelling Gastrointestinal:  ( - ) nausea, ( - )  heartburn, ( - ) change in bowel habits Skin: ( - ) abnormal skin rashes Lymphatics: ( - ) new lymphadenopathy, ( - ) easy bruising Neurological: ( - ) numbness, ( - ) tingling, ( - ) new weaknesses Behavioral/Psych: ( - ) mood change, ( - ) new changes  All other systems were reviewed with the patient and are negative.  PHYSICAL EXAMINATION:  Vitals:   10/31/22 1454  BP: 135/79  Pulse: 97  Resp: 16  Temp: (!) 97.4 F (36.3 C)  SpO2: 99%    Filed Weights   10/31/22 1454  Weight: 225 lb 9.6 oz (102.3 kg)     GENERAL: Well-appearing middle-aged African-American male, alert, no distress and comfortable SKIN: skin color, texture, turgor are normal, no rashes or significant lesions EYES: conjunctiva are pink and non-injected, sclera clear LUNGS: clear to auscultation and percussion with normal breathing effort HEART: regular rate & rhythm and no murmurs and no lower extremity edema Musculoskeletal: no cyanosis of digits and no clubbing  PSYCH: alert & oriented x 3, fluent speech NEURO: no focal motor/sensory deficits  LABORATORY DATA:  I have reviewed the data as listed    Latest Ref Rng & Units 10/31/2022    2:37 PM 09/26/2022    2:31 PM 09/16/2022    5:02 PM  CBC  WBC 4.0 - 10.5 K/uL 4.9  6.1  5.7   Hemoglobin 13.0 - 17.0 g/dL 29.5  62.1  30.8   Hematocrit 39.0 - 52.0 % 36.2  34.0  42.5   Platelets 150 - 400 K/uL 329  509  323        Latest Ref Rng & Units 10/31/2022    2:37 PM 09/26/2022    2:31 PM 09/16/2022    5:02 PM  CMP  Glucose 70 - 99 mg/dL 657  846  962   BUN 6 - 20 mg/dL 17  14  14    Creatinine 0.61 - 1.24 mg/dL 9.52  8.41  3.24   Sodium 135 - 145 mmol/L 137  138  133   Potassium 3.5 - 5.1  mmol/L 4.8  4.2  4.2   Chloride 98 - 111 mmol/L 102  104  96   CO2 22 - 32 mmol/L 29  25  27    Calcium 8.9 - 10.3 mg/dL 09.8  8.9  9.4   Total Protein 6.5 - 8.1 g/dL 7.7  7.1  8.0   Total Bilirubin 0.3 - 1.2 mg/dL 0.5  0.3  0.8   Alkaline Phos 38 - 126 U/L 77  74  95    AST 15 - 41 U/L 14  14  23    ALT 0 - 44 U/L 22  16  31     RADIOGRAPHIC STUDIES: No results found.  ASSESSMENT & PLAN Jonathan Austin 57 y.o. male with medical history significant for metastatic castrate sensitive prostate cancer who presents for a follow up visit.  After review of the labs, review of the records, and discussion with the patient the patients findings are most consistent with castrate sensitive prostate cancer with metastatic spread to the lymph nodes.   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 First dose on 08/08/2022. Dose to be administered today. Next due in Dec 2024.  -- labs today show WBC 4.9, hemoglobin 12.3, MCV 87.7, and platelets of 329. PSA on 09/16/2022 down to 0.4.  --RTC q 12-week visits to coincide with his Lupron shots.   #Polysubstance Abuse -- patient drinks 6 pack of beer daily  -- patient smokes 1 g of crack cocaine daily -- discussed cutting back/cessation of substances today    No orders of the defined types were placed in this encounter.   All questions were answered. The patient knows to call the clinic with any problems, questions or concerns.  A total of more than 30 minutes were spent on this encounter with face-to-face time and non-face-to-face time, including preparing to see the patient, ordering tests and/or medications, counseling the patient and coordination of care as outlined above.   Ulysees Barns, MD Department of Hematology/Oncology [facility] Phone: [phone] Pager: [phone] Email: [email or web address]  10/31/2022 3:26 PM

## 2022-11-01 ENCOUNTER — Telehealth: Payer: Self-pay | Admitting: *Deleted

## 2022-11-01 LAB — TESTOSTERONE: Testosterone: <3 ng/dL — ABNORMAL LOW (ref 264–916)

## 2022-11-01 LAB — PROSTATE-SPECIFIC AG, SERUM (LABCORP): Prostate Specific Ag, Serum: <0.1 ng/mL (ref 0.0–4.0)

## 2022-11-01 NOTE — Telephone Encounter (Signed)
-----   Message from Ulysees Barns IV sent at 11/01/2022  1:34 PM EDT ----- Please let Mr. Thierry know that his testosterone and PSA levels are undetectable.  These are excellent results.  Encouraged him to continue his Zytiga 1000 mg p.o. daily with prednisone p.o. daily.  Additionally we will continue the Lupron shots every 3 months. ----- Message ----- From: Leory Plowman, [facility] [place] Sent: 10/31/2022   2:52 PM EDT To: Jaci Standard, MD

## 2022-11-01 NOTE — Telephone Encounter (Signed)
Notified of message below

## 2022-11-01 NOTE — Telephone Encounter (Signed)
LM to call Dr Derek Mound nurse to review good labs.

## 2022-11-02 ENCOUNTER — Encounter: Payer: Self-pay | Admitting: Hematology and Oncology

## 2022-11-02 NOTE — Progress Notes (Signed)
Received voicemail from patient's spouse regarding an expense submitted for grant.  Patient called back and I explained the process and what was still needed for the expense which he was given the W-9 document which needs to be completed.  Advised patient to gather the expense sheet so that we could go over it in detail and he did. We went over each expense and how it is submitted and covered and the processing time. He verbalized understanding. Reminded patient that he has the $200 Prostate grant which covers medications and gas cards only and the $1000 [employer]ight which covers expenses on the sheet.  He has my card for any additional financial questions or concerns.

## 2022-11-21 ENCOUNTER — Other Ambulatory Visit (HOSPITAL_COMMUNITY): Payer: Self-pay

## 2022-11-21 ENCOUNTER — Other Ambulatory Visit: Payer: Self-pay | Admitting: Hematology and Oncology

## 2022-11-21 ENCOUNTER — Encounter: Payer: Self-pay | Admitting: Urology

## 2022-11-21 ENCOUNTER — Ambulatory Visit (INDEPENDENT_AMBULATORY_CARE_PROVIDER_SITE_OTHER): Payer: MEDICAID | Admitting: Urology

## 2022-11-21 ENCOUNTER — Other Ambulatory Visit: Payer: Self-pay

## 2022-11-21 VITALS — BP 158/91 | HR 92

## 2022-11-21 DIAGNOSIS — R338 Other retention of urine: Secondary | ICD-10-CM

## 2022-11-21 DIAGNOSIS — Z87448 Personal history of other diseases of urinary system: Secondary | ICD-10-CM | POA: Diagnosis not present

## 2022-11-21 DIAGNOSIS — N3946 Mixed incontinence: Secondary | ICD-10-CM

## 2022-11-21 LAB — MICROSCOPIC EXAMINATION

## 2022-11-21 LAB — URINALYSIS, COMPLETE
Bilirubin, UA: NEGATIVE
Glucose, UA: NEGATIVE
Ketones, UA: NEGATIVE
Nitrite, UA: NEGATIVE
Protein,UA: NEGATIVE
Specific Gravity, UA: 1.02 (ref 1.005–1.030)
Urobilinogen, Ur: 0.2 mg/dL (ref 0.2–1.0)
pH, UA: 5.5 (ref 5.0–7.5)

## 2022-11-21 LAB — BLADDER SCAN AMB NON-IMAGING: Scan Result: 4

## 2022-11-21 MED ORDER — GEMTESA 75 MG PO TABS
1.0000 | ORAL_TABLET | Freq: Every day | ORAL | Status: DC
Start: 2022-11-21 — End: 2023-01-02

## 2022-11-21 MED ORDER — ABIRATERONE ACETATE 250 MG PO TABS
1000.0000 mg | ORAL_TABLET | Freq: Every day | ORAL | 0 refills | Status: DC
  Filled 2022-11-21: qty 120, 30d supply, fill #0

## 2022-11-21 NOTE — Progress Notes (Signed)
Specialty Pharmacy Refill Coordination Note  AMORI COLOMB is a 57 y.o. male contacted today regarding refills of specialty medication(s) Abiraterone Acetate   Patient requested Delivery   Delivery date: 11/23/22   Verified address: Patient address [address]  [place] [address]   Medication will be filled on 11/22/22- *Pending Refill Request*.    Specialty Pharmacy Ongoing Clinical Assessment Note  KAYZEN KENDZIERSKI is a 57 y.o. male who is being followed by the specialty pharmacy service for RxSp Oncology   Patient's specialty medication(s) reviewed today: Abiraterone Acetate   Missed doses in the last 4 weeks: 0   Patient/Caregiver did not have any additional questions or concerns.   No data recorded  Adverse events/side effects summary: Experienced adverse events/side effects (hot flashes, tolerable)   Patient's therapy is appropriate to: Continue    Goals Addressed             This Visit's Progress    Slow Disease Progression       Patient is on track. Patient will maintain adherence.  The most recent PSA was <0.1.          Follow up:  6 months  Servando Snare Specialty Pharmacist   Clinical Intervention Note  Clinical Intervention Notes: Patient started Crestor 20mg  and Ozempic, Zytiga increases the concentration and the potential side effects of the Crestor. I counseled the patient's wife about the potential side effects to watch out for and she plans to call the PCP to discuss.   Clinical Intervention Outcomes: Prevention of an adverse drug event   Eulah Citizen

## 2022-11-21 NOTE — Progress Notes (Signed)
11/21/2022 2:14 PM   [name] Jun 17, 1965 [id number]  Referring provider: Irven Coe, MD [address] [place],  [place] [address]  No chief complaint on file.   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  Transurethral resection of prostate April 2021 by Dr. Liliane Shi.  Has had a radiation.  Recent PSA 0.1.  Has failed Vesicare and Myrbetriq for mixed incontinence.  In May 2023 had a urethral stricture dilated.  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  He had radiation changes in his bladder.   A few years ago he was seeing Dr. Laverle Patter.  He had positive lymph nodes.  It was recommended to have transurethral resection of the prostate prior to planned radiation for his significant lower urinary tract symptoms.  It was felt that he had locally advanced disease causing some of the symptoms.  According to Dr. Liliane Shi he had bothersome urgency frequency and a poor flow in spite of Flomax prior to surgery or he had a channel TURP and circumcision.  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  Prior to dilation he had worsening lower urinary tract symptoms including urgency and incontinence.  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  I think he has some rigidity in the prostatic urethra.  He had radiation changes.   Currently leaks a small amount with coughing sneezing bending lifting.  He leaks more when he is active.  Primary problem is urge incontinence.  He has moderately severe bedwetting.  He can soak 3-4 pads a day.  He said he was continent after the transurethral resection of the prostate and only started to leak a few months prior to the urethral dilation.  I do not see any urodynamics done prior to the transurethral section of the prostate.  I did not see bladder capacity measured under anesthesia  He voids every 1  hour.  No nocturia.  Flow was good.  Right-handed and has had a hernia repair.  He is an insulin-dependent diabetic.  He has failed Vesicare and Myrbetriq.  He had shorter penis in a standing position I think he did have a positive cough test.  Male genitalia otherwise normal     Patient has mixed incontinence.  He has bedwetting.  I'm suspect that he has a small spastic bladder.  He likely may also have urethral insufficiency which is less common postradiation and transurethral section of the prostate.  He has had a stricture which would make it sphincter more difficult to place in the bulbar urethra.  He may need repeat cystoscopy in the future.  Call if culture positive.  Order urodynamics and proceed accordingly.  He may need to be supine his bladder neck occluded with a small catheter to measure his bladder capacity if he starts leaking a lot at low volumes during urodynamics    When the patient saw the nurse practitioner recently he apparently had no insurance and therefore could not undergo urodynamics and cystoscopy. Patient still has high-volume mixed incontinence especially when his sugars are out of control.  He is on insulin.  He has sleep apnea.  He now has Medicaid and would like to organize the urodynamics.  Clinically not infected.   I urged him to follow-up with the cancer center in [facility] for his ongoing prostate cancer surveillance and treatment  Follow-up with urodynamics and proceed accordingly.  Currently his flow was good.  I am suspect that he has a small capacity overactive bladder in combination with urethral insufficiency    I last saw the patient in February 2024 and I do not think he had urodynamics for insurance reasons so had them at [facility].  In May he was seen by our nurse practitioner.  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  Referred to cancer center for further evaluation.   It  appears the patient went to the emergency room September 16, 2022 difficulty voiding.  He was having retention symptoms.  He was on immunotherapy.  Fully catheter was placed for 450 mL. CT scan demonstrated normal kidneys with no hydronephrosis.  Turns out he had a pos culture that was treated and he will finish antibiotics tomorrow   Patient had urodynamics.  He voided 258 mL with a maximal flow of 33 mL/s and his residual is 3 mL.  Maximum bladder capacity was 256 mL.  He had increased bladder sensation.  Bladder was stable.  At lower volumes his leak point pressure was 102 cm water.  At higher volumes range between 140 and 163 cm of water.  During voiding he did well sustain detrusor contraction at 54 cm of water and acute backs at 9.5 mL/s.  EMG activity normal.    Patient has mixed incontinence and a complicated presentation with no quick treatment path.  He understands that approximately 70% of his problem is a small capacity overactive bladder resulting in urge incontinence and bedwetting.  He has mild urethral insufficiency causing the stress incontinence.  Placing artificial sphincter may not help the bedwetting or overactive bladder component, he recently had a urinary tract infection with retention, and a bulbar urethral stricture that would make placement of the sphincter more challenging or complicating in the future.   he saw the medical oncologist and started new immunotherapy in the last 4 to 6 weeks in [place].  I want him to have a trial of voiding tomorrow and another urine could be sent for culture.  I would like to have him see Dr. Lonna Cobb to make certain that his prostate cancer is appropriately managed.  He might benefit from cystoscopy but the catheter went in still likely if he has a stricture it is mild.  We want to make certain that his residuals remain low.  My next step would be to try [facility] but this could complicate retention risks.  Third line overactive bladder treatment is  something to consider and except the stress incontinence.   Gets more challenging for the patient to understand the complexity of his incontinence issue and we will continue to explain it to him moving forward. Patient understand the stepwise approach  Today Patient had a successful trial of voiding October 04, 2022.  Still wearing 4-5 pads a day.  High-volume leakage.  High-volume bedwetting.  Cancer checkup was cancer free.  Flow was good.  He is channel TURP was in 2021 in [place] by radiation.  In May 2023 Dr. 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  I read the operative note.  It was dilated from 10-24 [place] with dilators.  Distance was felt through the region of the prostatic urethra.  Once dilated the operative note suggest that he may have had stricture in the prostatic urethra and/or bladder neck.  He had erythema consistent with  radiation changes of the bladder mucosa.   PMH: Past Medical History:  Diagnosis Date   Alcohol use disorder, severe, dependence (HCC)    05-24-2019 recent [facility] admission for suicide ideation and alcohol / cocaine detox 05-04-2019 in epic,  pt stated has been taking naltrexone daily as prescribed since and no alcohol since   Cocaine abuse (HCC)    05-24-2019  pt had recent [facility] admission, pt stated last used  "crack" 05-03-2019   Cocaine abuse with cocaine-induced mood disorder (HCC)    Depression    GERD (gastroesophageal reflux disease)    History of suicidal ideation    multiple [facility] admission's;  last one admitted 05-04-2019   Hyperplasia of prostate with lower urinary tract symptoms (LUTS)    Hypertension    followed by pcp   OSA on CPAP    uses intermittently   Phimosis    Prostate cancer [facility]) urologist-- dr winter   dx 02/ 2021;  localized advanced, gleason 4+4, PSA 317   Renal insufficiency    Sinus tachycardia    Thoracic aortic atherosclerosis (HCC)    Type 2 diabetes mellitus treated with insulin (HCC)     followed by pcp   (05-24-2019  per stated checks blood sugar twice daily, fasting sugar-- 180-200)   Wears glasses     Surgical History: Past Surgical History:  Procedure Laterality Date   CIRCUMCISION N/A 05/29/2019   Procedure: CIRCUMCISION ADULT;  Surgeon: Rene Paci, MD;  [facility];  Service: Urology;  Laterality: N/A;   CYSTOSCOPY WITH URETHRAL DILATATION N/A 06/08/2021   Procedure: CYSTOSCOPY WITH URETHRAL DILATATION;  Surgeon: Riki Altes, MD;  Location: [facility];  Service: Urology;  Laterality: N/A;   PROSTATE BIOPSY     TONSILLECTOMY  child   TRANSURETHRAL RESECTION OF PROSTATE N/A 05/29/2019   Procedure: TRANSURETHRAL RESECTION OF THE PROSTATE (TURP)/ CYSTOSCOPY;  Surgeon: Rene Paci, MD;  Location: [facility];  Service: Urology;  Laterality: N/A;    Home Medications:  Allergies as of 11/21/2022   No Known Allergies      Medication List        Accurate as of November 21, 2022  2:14 PM. If you have any questions, ask your nurse or doctor.          abiraterone acetate 250 MG tablet Commonly known as: ZYTIGA Take 4 tablets (1,000 mg total) by mouth daily. Take on an empty stomach 1 hour before or 2 hours after a meal   Accu-Chek Guide test strip Generic drug: glucose blood USE UP TO 4 TIMES A DAY AS DIRECTED   Accu-Chek Softclix Lancets lancets SMARTSIG:Topical 1-4 Times Daily   blood glucose meter kit and supplies Dispense based on patient and insurance preference. Use up to four times daily as directed. (FOR ICD-10 E10.9, E11.9).   Insulin Pen Needle 31G X 5 MM Misc 1 Needle by Does not apply route 2 (two) times daily.   NONFORMULARY OR COMPOUNDED ITEM Trimix (30/1/10)-(Pap/Phent/PGE)  Test Dose  1ml vial   Qty #3 Refills 0  [facility] [phone] Fax [phone]   NovoLOG Mix 70/30 FlexPen (70-30) 100 UNIT/ML FlexPen Generic drug: insulin aspart protamine -  aspart Inject 20 Units into the skin 2 (two) times daily.   predniSONE 5 MG tablet Commonly known as: DELTASONE Take 1 tablet (5 mg total) by mouth daily with breakfast.        Allergies: No Known Allergies  Family History: Family History  Problem Relation Age of Onset   Hypertension Mother    Hypertension Father    Diabetes Father    Prostate cancer Maternal Grandfather 52   Breast cancer Neg Hx    Colon cancer Neg Hx    Pancreatic cancer Neg Hx     Social History:  reports that he has never smoked. He has never been exposed to tobacco smoke. He has never used smokeless tobacco. He reports current alcohol use of about 2.0 standard drinks of alcohol per week. He reports current drug use. Drugs: Cocaine and Marijuana.  ROS:                                        Physical Exam: There were no vitals taken for this visit.  Constitutional:  Alert and oriented, No acute distress. HEENT:  AT, moist mucus membranes.  Trachea midline, no masses.   Laboratory Data: Lab Results  Component Value Date   WBC 4.9 10/31/2022   HGB 12.3 (L) 10/31/2022   HCT 36.2 (L) 10/31/2022   MCV 87.7 10/31/2022   PLT 329 10/31/2022    Lab Results  Component Value Date   CREATININE 1.02 10/31/2022    No results found for: "PSA"  Lab Results  Component Value Date   TESTOSTERONE <3 (L) 10/31/2022    Lab Results  Component Value Date   HGBA1C 11.8 (H) 04/11/2022    Urinalysis    Component Value Date/Time   COLORURINE YELLOW 09/16/2022 1702   APPEARANCEUR Hazy (A) 10/03/2022 1433   LABSPEC 1.025 09/16/2022 1702   PHURINE 6.0 09/16/2022 1702   GLUCOSEU 3+ (A) 10/03/2022 1433   HGBUR SMALL (A) 09/16/2022 1702   BILIRUBINUR Negative 10/03/2022 1433   KETONESUR NEGATIVE 09/16/2022 1702   PROTEINUR 1+ (A) 10/03/2022 1433   PROTEINUR NEGATIVE 09/16/2022 1702   UROBILINOGEN 0.2 12/17/2014 1649   NITRITE Positive (A) 10/03/2022 1433   NITRITE NEGATIVE  09/16/2022 1702   LEUKOCYTESUR 1+ (A) 10/03/2022 1433   LEUKOCYTESUR TRACE (A) 09/16/2022 1702    Pertinent Imaging: Urine negative but sent for culture  Assessment & Plan: Reassess patient in 6 weeks on Gemtesa.  Complexity discussed with patient again.  I am highly suspect that stricture was in the level of the prostatic urethra bladder neck and or bulbar urethra.  If this recurs proximal to the sphincter it would be troublesome.  Repeat cystoscopy would need to be performed.  The retention with Gemtesa discussed.  It has been over a year since he was dilated.  I am a bit concerned that he hit he will still have incontinence with a third line OAB therapy but is likely a good step to consider  There are no diagnoses linked to this encounter.  No follow-ups on file.  Martina Sinner, MD  [facility] [address] [place], [place] [address] [phone]

## 2022-11-24 LAB — CULTURE, URINE COMPREHENSIVE

## 2022-12-06 ENCOUNTER — Ambulatory Visit: Payer: MEDICAID | Admitting: Podiatry

## 2022-12-08 ENCOUNTER — Other Ambulatory Visit: Payer: Self-pay

## 2022-12-12 ENCOUNTER — Encounter: Payer: Self-pay | Admitting: Podiatry

## 2022-12-12 ENCOUNTER — Ambulatory Visit (INDEPENDENT_AMBULATORY_CARE_PROVIDER_SITE_OTHER): Payer: MEDICAID | Admitting: Podiatry

## 2022-12-12 DIAGNOSIS — B351 Tinea unguium: Secondary | ICD-10-CM | POA: Diagnosis not present

## 2022-12-12 DIAGNOSIS — M79675 Pain in left toe(s): Secondary | ICD-10-CM

## 2022-12-12 DIAGNOSIS — M79674 Pain in right toe(s): Secondary | ICD-10-CM | POA: Diagnosis not present

## 2022-12-12 DIAGNOSIS — E1142 Type 2 diabetes mellitus with diabetic polyneuropathy: Secondary | ICD-10-CM

## 2022-12-12 NOTE — Progress Notes (Signed)
This patient returns to my office for at risk foot care.  This patient requires this care by a professional since this patient will be at risk due to having diabetes.  This patient is unable to cut nails himself since the patient cannot reach his nails.These nails are painful walking and wearing shoes.  He presents to the office with male caregiver.This patient presents for at risk foot care today.  General Appearance  Alert, conversant and in no acute stress.  Vascular  Dorsalis pedis and posterior tibial  pulses are palpable  bilaterally.  Capillary return is within normal limits  bilaterally. Temperature is within normal limits  bilaterally.  Neurologic  Senn-Weinstein monofilament wire test diminished  bilaterally. Muscle power within normal limits bilaterally.  Nails Thick disfigured discolored nails with subungual debris  from hallux to fifth toes bilaterally. No evidence of bacterial infection or drainage bilaterally.  Orthopedic  No limitations of motion  feet .  No crepitus or effusions noted.  No bony pathology or digital deformities noted.  Skin  normotropic skin with no porokeratosis noted bilaterally.  No signs of infections or ulcers noted.     Onychomycosis  Pain in right toes  Pain in left toes  Consent was obtained for treatment procedures.   Mechanical debridement of nails 1-5  bilaterally performed with a nail nipper.  Filed with dremel without incident.    Return office visit     3 months                Told patient to return for periodic foot care and evaluation due to potential at risk complications.   Helane Gunther DPM

## 2022-12-13 ENCOUNTER — Other Ambulatory Visit: Payer: Self-pay

## 2022-12-16 ENCOUNTER — Other Ambulatory Visit: Payer: Self-pay

## 2022-12-19 ENCOUNTER — Other Ambulatory Visit: Payer: Self-pay

## 2022-12-19 ENCOUNTER — Other Ambulatory Visit: Payer: Self-pay | Admitting: Hematology and Oncology

## 2022-12-19 MED ORDER — ABIRATERONE ACETATE 250 MG PO TABS
1000.0000 mg | ORAL_TABLET | Freq: Every day | ORAL | 0 refills | Status: DC
  Filled 2022-12-19: qty 120, 30d supply, fill #0

## 2022-12-19 NOTE — Progress Notes (Signed)
Specialty Pharmacy Refill Coordination Note  Jonathan Austin is a 57 y.o. male contacted today regarding refills of specialty medication(s) Abiraterone Acetate   Patient requested Delivery   Delivery date: 12/23/22   Verified address: Patient address [address]  [place] [address]   Medication will be filled on 12/22/22.  Refill request pending.

## 2022-12-22 ENCOUNTER — Other Ambulatory Visit: Payer: Self-pay

## 2023-01-02 ENCOUNTER — Ambulatory Visit: Payer: MEDICAID | Admitting: Urology

## 2023-01-02 ENCOUNTER — Encounter: Payer: Self-pay | Admitting: Urology

## 2023-01-02 VITALS — BP 146/84 | HR 98

## 2023-01-02 DIAGNOSIS — N3946 Mixed incontinence: Secondary | ICD-10-CM | POA: Diagnosis not present

## 2023-01-02 DIAGNOSIS — R338 Other retention of urine: Secondary | ICD-10-CM | POA: Diagnosis not present

## 2023-01-02 LAB — URINALYSIS, COMPLETE
Bilirubin, UA: NEGATIVE
Glucose, UA: NEGATIVE
Ketones, UA: NEGATIVE
Nitrite, UA: NEGATIVE
Specific Gravity, UA: 1.025 (ref 1.005–1.030)
Urobilinogen, Ur: 0.2 mg/dL (ref 0.2–1.0)
pH, UA: 6 (ref 5.0–7.5)

## 2023-01-02 LAB — MICROSCOPIC EXAMINATION: Epithelial Cells (non renal): >10 /[HPF] — AB (ref 0–10)

## 2023-01-02 NOTE — Progress Notes (Signed)
01/02/2023 2:55 PM   [name] [birth year] [id number]  Referring provider: Irven Coe, MD [address] [place],  [place] [address]  Chief Complaint  Patient presents with   Follow-up    HPI: ST: Carcinoma of the prostate with PSA of 317.  Transurethral resection of prostate April 2021 by Dr. Liliane Shi.  Has had a radiation.  Recent PSA 0.1.  Has failed Vesicare and Myrbetriq for mixed incontinence.  In May 2023 had a urethral stricture dilated.  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  He had radiation changes in his bladder.   A few years ago he was seeing Dr. Laverle Patter.  He had positive lymph nodes.  It was recommended to have transurethral resection of the prostate prior to planned radiation for his significant lower urinary tract symptoms.  It was felt that he had locally advanced disease causing some of the symptoms.  According to Dr. Liliane Shi he had bothersome urgency frequency and a poor flow in spite of Flomax prior to surgery or he had a channel TURP and circumcision.  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  Prior to dilation he had worsening lower urinary tract symptoms including urgency and incontinence.  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  I think he has some rigidity in the prostatic urethra.  He had radiation changes.   Currently leaks a small amount with coughing sneezing bending lifting.  He leaks more when he is active.  Primary problem is urge incontinence.  He has moderately severe bedwetting.  He can soak 3-4 pads a day.  He said he was continent after the transurethral resection of the prostate and only started to leak a few months prior to the urethral dilation.  I do not see any urodynamics done prior to the transurethral section of the prostate.  I did not see bladder capacity measured under  anesthesia  He voids every 1 hour.  No nocturia.  Flow was good.  Right-handed and has had a hernia repair.  He is an insulin-dependent diabetic.  He has failed Vesicare and Myrbetriq.  He had shorter penis in a standing position I think he did have a positive cough test.  Male genitalia otherwise normal     Patient has mixed incontinence.  He has bedwetting.  I'm suspect that he has a small spastic bladder.  He likely may also have urethral insufficiency which is less common postradiation and transurethral section of the prostate.  He has had a stricture which would make it sphincter more difficult to place in the bulbar urethra.  He may need repeat cystoscopy in the future.  Call if culture positive.  Order urodynamics and proceed accordingly.  He may need to be supine his bladder neck occluded with a small catheter to measure his bladder capacity if he starts leaking a lot at low volumes during urodynamics    When the patient saw the nurse practitioner recently he apparently had no insurance and therefore could not undergo urodynamics and cystoscopy. Patient still has high-volume mixed incontinence especially when his sugars are out of control.  He is on insulin.  He has sleep apnea.  He now has Medicaid and would like to organize the urodynamics.  Clinically not infected.   I urged him to follow-up with the cancer center in [place] [facility] for his ongoing prostate cancer  surveillance and treatment    Follow-up with urodynamics and proceed accordingly.  Currently his flow was good.  I am suspect that he has a small capacity overactive bladder in combination with urethral insufficiency    I last saw the patient in February 2024 and I do not think he had urodynamics for insurance reasons so had them at [facility].  In May he was seen by our nurse practitioner.  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  Referred to cancer center for  further evaluation.   It appears the patient went to the emergency room September 16, 2022 difficulty voiding.  He was having retention symptoms.  He was on immunotherapy.  Fully catheter was placed for 450 mL. CT scan demonstrated normal kidneys with no hydronephrosis.  Turns out he had a pos culture that was treated and he will finish antibiotics tomorrow   Patient had urodynamics.  He voided 258 mL with a maximal flow of 33 mL/s and his residual is 3 mL.  Maximum bladder capacity was 256 mL.  He had increased bladder sensation.  Bladder was stable.  At lower volumes his leak point pressure was 102 cm water.  At higher volumes range between 140 and 163 cm of water.  During voiding he did well sustain detrusor contraction at 54 cm of water and acute backs at 9.5 mL/s.  EMG activity normal.     Patient has mixed incontinence and a complicated presentation with no quick treatment path.  He understands that approximately 70% of his problem is a small capacity overactive bladder resulting in urge incontinence and bedwetting.  He has mild urethral insufficiency causing the stress incontinence.  Placing artificial sphincter may not help the bedwetting or overactive bladder component, he recently had a urinary tract infection with retention, and a bulbar urethral stricture that would make placement of the sphincter more challenging or complicating in the future.   he saw the medical oncologist and started new immunotherapy in the last 4 to 6 weeks in [place].  I want him to have a trial of voiding tomorrow and another urine could be sent for culture.  I would like to have him see Dr. Lonna Cobb to make certain that his prostate cancer is appropriately managed.  He might benefit from cystoscopy but the catheter went in still likely if he has a stricture it is mild.  We want to make certain that his residuals remain low.  My next step would be to try [facility] but this could complicate retention risks.  Third line  overactive bladder treatment is something to consider and accept the stress incontinence.   Gets more challenging for the patient to understand the complexity of his incontinence issue and we will continue to explain it to him moving forward. Patient understand the stepwise approach   Patient had a successful trial of voiding October 04, 2022.  Still wearing 4-5 pads a day.  High-volume leakage.  High-volume bedwetting.  Cancer checkup was cancer free.  Flow was good.   He is channel TURP was in 2021 in [place] by radiation.  In May 2023 Dr. 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  I read the operative note.  It was dilated from 10-24 [place] with dilators.  Distance was felt through the region of the prostatic urethra.  Once dilated the operative note suggest that he may have had stricture in the prostatic urethra and/or  bladder neck.  He had erythema consistent with radiation changes of the bladder mucosa.   Reassess patient in 6 weeks on Gemtesa.  Complexity discussed with patient again.  I am highly suspect that stricture was in the level of the prostatic urethra bladder neck and or bulbar urethra.  If this recurs proximal to the sphincter it would be troublesome.  Repeat cystoscopy would need to be performed.  The retention with Gemtesa discussed.  It has been over a year since he was dilated.   I am a bit concerned that he hit he will still have incontinence with a third line OAB therapy but is likely a good step to consider   Today The patient had a positive urine culture in August 2024 and his culture was again positive in October 2024 during the last visit.  Both were Klebsiella.  CT scan in April 2024 normal with no evidence of metastatic disease.  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  He again said his incontinence started all of a sudden while he was working  before the dilation of stricture and dilation did not affect.  Clinically not infected today and he understands he does not have an obvious etiology.    PMH: Past Medical History:  Diagnosis Date   Alcohol use disorder, severe, dependence (HCC)    05-24-2019 recent [facility] admission for suicide ideation and alcohol / cocaine detox 05-04-2019 in epic,  pt stated has been taking naltrexone daily as prescribed since and no alcohol since   Cocaine abuse (HCC)    05-24-2019  pt had recent [facility] admission, pt stated last used  "crack" 05-03-2019   Cocaine abuse with cocaine-induced mood disorder (HCC)    Depression    GERD (gastroesophageal reflux disease)    History of suicidal ideation    multiple [facility] admission's;  last one admitted 05-04-2019   Hyperplasia of prostate with lower urinary tract symptoms (LUTS)    Hypertension    followed by pcp   OSA on CPAP    uses intermittently   Phimosis    Prostate cancer [facility]) urologist-- dr winter   dx 02/ 2021;  localized advanced, gleason 4+4, PSA 317   Renal insufficiency    Sinus tachycardia    Thoracic aortic atherosclerosis (HCC)    Type 2 diabetes mellitus treated with insulin (HCC)    followed by pcp   (05-24-2019  per stated checks blood sugar twice daily, fasting sugar-- 180-200)   Wears glasses     Surgical History: Past Surgical History:  Procedure Laterality Date   CIRCUMCISION N/A 05/29/2019   Procedure: CIRCUMCISION ADULT;  Surgeon: Rene Paci, MD;  Location: [facility];  Service: Urology;  Laterality: N/A;   CYSTOSCOPY WITH URETHRAL DILATATION N/A 06/08/2021   Procedure: CYSTOSCOPY WITH URETHRAL DILATATION;  Surgeon: Riki Altes, MD;  Location: [facility];  Service: Urology;  Laterality: N/A;   PROSTATE BIOPSY     TONSILLECTOMY  child   TRANSURETHRAL RESECTION OF PROSTATE N/A 05/29/2019   Procedure: TRANSURETHRAL RESECTION OF THE PROSTATE (TURP)/ CYSTOSCOPY;  Surgeon: Rene Paci, MD;   Location: [facility];  Service: Urology;  Laterality: N/A;    Home Medications:  Allergies as of 01/02/2023   No Known Allergies      Medication List        Accurate as of January 02, 2023  2:55 PM.  If you have any questions, ask your nurse or doctor.          abiraterone acetate 250 MG tablet Commonly known as: ZYTIGA Take 4 tablets (1,000 mg total) by mouth daily. Take on an empty stomach 1 hour before or 2 hours after a meal   Accu-Chek Guide test strip Generic drug: glucose blood USE UP TO 4 TIMES A DAY AS DIRECTED   Accu-Chek Softclix Lancets lancets SMARTSIG:Topical 1-4 Times Daily   blood glucose meter kit and supplies Dispense based on patient and insurance preference. Use up to four times daily as directed. (FOR ICD-10 E10.9, E11.9).   Gemtesa 75 MG Tabs Generic drug: Vibegron Take 1 tablet (75 mg total) by mouth daily.   Insulin Pen Needle 31G X 5 MM Misc 1 Needle by Does not apply route 2 (two) times daily.   NONFORMULARY OR COMPOUNDED ITEM Trimix (30/1/10)-(Pap/Phent/PGE)  Test Dose  1ml vial   Qty #3 Refills 0  [facility] [phone] Fax [phone]   NovoLOG Mix 70/30 FlexPen (70-30) 100 UNIT/ML FlexPen Generic drug: insulin aspart protamine - aspart Inject 20 Units into the skin 2 (two) times daily.   Ozempic (0.25 or 0.5 MG/DOSE) 2 MG/3ML Sopn Generic drug: Semaglutide(0.25 or 0.5MG /DOS) SMARTSIG:0.25 Milligram(s) SUB-Q Once a Week   predniSONE 5 MG tablet Commonly known as: DELTASONE Take 1 tablet (5 mg total) by mouth daily with breakfast.   rosuvastatin 20 MG tablet Commonly known as: CRESTOR Take 20 mg by mouth daily.        Allergies: No Known Allergies  Family History: Family History  Problem Relation Age of Onset   Hypertension Mother    Hypertension Father    Diabetes Father    Prostate cancer Maternal Grandfather 20   Breast cancer Neg Hx    Colon cancer Neg Hx    Pancreatic cancer  Neg Hx     Social History:  reports that he has never smoked. He has never been exposed to tobacco smoke. He has never used smokeless tobacco. He reports current alcohol use of about 2.0 standard drinks of alcohol per week. He reports current drug use. Drugs: Cocaine and Marijuana.  ROS:                                        Physical Exam: There were no vitals taken for this visit.  Constitutional:  Alert and oriented, No acute distress. HEENT: [place] AT, moist mucus membranes.  Trachea midline, no masses.   Laboratory Data: Lab Results  Component Value Date   WBC 4.9 10/31/2022   HGB 12.3 (L) 10/31/2022   HCT 36.2 (L) 10/31/2022   MCV 87.7 10/31/2022   PLT 329 10/31/2022    Lab Results  Component Value Date   CREATININE 1.02 10/31/2022    No results found for: "PSA"  Lab Results  Component Value Date   TESTOSTERONE <3 (L) 10/31/2022    Lab Results  Component Value Date   HGBA1C 11.8 (H) 04/11/2022    Urinalysis    Component Value Date/Time   COLORURINE YELLOW 09/16/2022 1702   APPEARANCEUR Clear 11/21/2022 1421   LABSPEC 1.025 09/16/2022 1702   PHURINE 6.0 09/16/2022 1702   GLUCOSEU Negative 11/21/2022 1421   HGBUR SMALL (A) 09/16/2022 1702   BILIRUBINUR Negative 11/21/2022 1421   KETONESUR NEGATIVE 09/16/2022 1702   PROTEINUR Negative 11/21/2022 1421   PROTEINUR NEGATIVE  09/16/2022 1702   UROBILINOGEN 0.2 12/17/2014 1649   NITRITE Negative 11/21/2022 1421   NITRITE NEGATIVE 09/16/2022 1702   LEUKOCYTESUR Trace (A) 11/21/2022 1421   LEUKOCYTESUR TRACE (A) 09/16/2022 1702    Pertinent Imaging: Urine reviewed and sent for culture.  Chart reviewed  Assessment & Plan: I discussed the 3 refractory treatments in detail.  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  He understands that all 3 treatments may not work as well because of this and also he would still have stress incontinence.  He  understands an artificial sphincter may not help refractory treatments and radiation cystitis can be complicated by his stricture history and retention history.  His flow was currently good but I do plan likely to cystoscope him in the future especially ever chooses Botox.  He would like to do a peripheral nerve evaluation I think this is a good choice.  All handouts given.  Understands the bleeding may be worse and radiated bladders with Botox but overall I felt of the procedure is safe.  He understands retention rates especially with his past history is a male retention rate may be higher with Botox.  He does not take daily aspirin or blood thinners.  Site of service issues discussed  1. Acute urinary retention   2. Mixed stress and urge urinary incontinence  - Urinalysis, Complete   No follow-ups on file.  Martina Sinner, MD  [facility] [address], [address] [place], [place] [address] [phone]

## 2023-01-02 NOTE — Patient Instructions (Signed)
MedTronic InterStim Placement  Past treatments haven't given you satisfactory results, which may be due to the way these treatments focus on the muscle rather than the nerves. They don't target the miscommunication between your bladder and your brain. Because bladder function involves both muscles and nerves, you may need something that addresses the communication problem between the bladder and the brain that can cause symptoms.  Medtronic Bladder Control Therapy is unique because it offers a simple evaluation to see if it's right for you. The evaluation is a minimally invasive procedure We will be looking to see if your troublesome bladder symptoms are reduced by at least 50% In as few as 3 days, you'll know if:you're a candidate for long-term treatment we simply need more information   It's important that you document your symptoms before and during your evaluation to help me understand if you see any improvements. I will provide you with a Symptom Tracker and I would like you to document your symptoms for a minimum of three days. Bring your Symptom Tracker back with you at your next appointment.  Tracking daily will help [place] determine if the therapy delivered by the InterStimT system is right for you. You will have 3 appointments scheduled: 1st appointment for a 20-minute, in-office procedure that allows you to try the therapy for a few days. 2nd appointment to remove the lead (thin wire) from your evaluation. 3rd appointment is the date for long-term therapy if you're a candidate or for an advanced evaluation if we need more information.  Here's a brochure explaining the in-office procedure and long-term therapy, as well as the Symptom Tracker we discussed to document your symptoms. For more information, visit [email or web address].     Urgent PC office-based treatment for overactive bladder Take back control of your life! Urgent PC is a non-drug, non-surgical option for overactive  bladder and associated symptoms of urinary urgency, urinary frequency and urge incontinence  Urgent-PC- Since 2003, healthcare professionals have used the Urgent PC Neuromodulation System as an effective office treatment for men and women suffering from overactive bladder, a condition commonly referred to as OAB. Urgent PC is up to 80% effective, even after conservative measures and OAB drugs have failed. Plus, Urgent PC is very low risk, making it a great choice for people unable or unwilling to have more invasive procedures.  How does Urgent PC work?  The Urgent PC system delivers a specific type of neuromodulation called percutaneous tibial nerve stimulation (PTNS). During treatment, a small, slim needle electrode is inserted near your ankle. The needle electrode is then connected to the battery-powered stimulator. During your 30-minute treatment, mild impulses from the stimulator travel through the needle electrode, along your leg and to the nerves in your pelvis that control bladder function. This process is also referred to as neuromodulation.  What will I feel with Urgent PC therapy?  Because patients may experience the sensation of the Urgent PC therapy in different ways, it's difficult to say what the treatment would feel like to you. Patients often describe the sensation as "tingling" or "pulsating." Treatment is typically well-tolerated by patients. Urgent PC offers many different levels of stimulation, so your clinician will be able to adjust treatment to suit you as well as address any discomfort that you might experience during treatment.  How often will I need Urgent PC treatments? You will receive an initial series of 12 treatments scheduled about a week apart. If you respond, you will likely need a treatment about once per month  to maintain your improvements.  How soon will I see results with Urgent PC? Because Urgent PC gently modifies the signals to achieve bladder control, it  usually takes 5-7 weeks for symptoms to change. However, patients respond at different rates. In a review of about 100 patients who had success with Urgent PC, symptoms improved anywhere between 2-12 weeks. For about 20% of these patients, the symptoms of urgency and/or urge incontinence didn't improve until after 8 weeks.1  There is no way to anticipate who will respond earlier, later or not at all. That's why it is important to receive the 12 recommended treatments before you and your physician evaluate whether this therapy is an appropriate and effective choice for you.  How can I receive treatment with Urgent PC? Urgent PC is an option for patients with OAB. If you think you have OAB, talk to your doctor, a urologist or urogynecologist. If you have OAB, the doctor will work with you to determine your own personal treatment plan which usually starts with behavior and diet modifications plus medications. Urgent PC is an excellent option if these options don't work or provide sufficient improvements. Treatment with Urgent PC is typically performed at the office of a urologist, urogynecologist or gynecologist. So, if you run out of options with your normal doctor, consider visiting one of these specialists.  Does insurance cover Urgent PC? Urgent PC treatment is reimbursed by Medicare across the [place]. Private insurance coverage varies by state. To see if your insurance company covers Urgent PC, use our Coverage Finder or talk to your Healthcare Provider.  Are there patients who should not be treated with Urgent PC? Yes, these include: patients with pacemakers or implantable defibrillators, patients prone to excessive bleeding, patients with nerve damage that could impact either percutaneous tibial nerve or pelvic floor function and patients who are pregnant or planning to become pregnant during the duration of the treatment.  What are the risks associated with Urgent PC? The risks associated  with Urgent PC therapy are low. Most common side-effects are temporary and include mild pain or skin inflammation at or near the stimulation site.

## 2023-01-04 ENCOUNTER — Other Ambulatory Visit: Payer: Self-pay

## 2023-01-04 ENCOUNTER — Other Ambulatory Visit (HOSPITAL_COMMUNITY): Payer: Self-pay

## 2023-01-04 ENCOUNTER — Other Ambulatory Visit: Payer: Self-pay | Admitting: Hematology and Oncology

## 2023-01-04 MED ORDER — ABIRATERONE ACETATE 250 MG PO TABS
1000.0000 mg | ORAL_TABLET | Freq: Every day | ORAL | 0 refills | Status: DC
  Filled 2023-01-04: qty 120, 30d supply, fill #0

## 2023-01-04 MED ORDER — PREDNISONE 5 MG PO TABS
5.0000 mg | ORAL_TABLET | Freq: Every day | ORAL | 1 refills | Status: DC
  Filled 2023-01-04: qty 30, 30d supply, fill #0
  Filled 2023-02-14: qty 30, 30d supply, fill #1
  Filled 2023-03-14 (×2): qty 30, 30d supply, fill #2
  Filled 2023-04-11: qty 30, 30d supply, fill #3

## 2023-01-04 NOTE — Progress Notes (Signed)
Specialty Pharmacy Refill Coordination Note  Jonathan Austin is a 57 y.o. male contacted today regarding refills of specialty medication(s) Abiraterone Acetate   Patient requested Delivery   Delivery date: 01/17/23   Verified address: Patient address [address]  [place] [id number]   Medication will be filled on 01/16/23, pending refill request.

## 2023-01-05 LAB — CULTURE, URINE COMPREHENSIVE

## 2023-01-16 ENCOUNTER — Other Ambulatory Visit: Payer: Self-pay

## 2023-01-18 ENCOUNTER — Telehealth: Payer: Self-pay | Admitting: *Deleted

## 2023-01-18 NOTE — Telephone Encounter (Signed)
Pt calling asking what his insurance will cover, pt was going to be seen at [facility] and they do not take his insurance and pt has a balance with [facility]. Per pt he was told he could be seen for $1100.00 and pt states he can not afford that. Please advise on next step?

## 2023-01-22 ENCOUNTER — Other Ambulatory Visit: Payer: Self-pay | Admitting: Hematology and Oncology

## 2023-01-22 DIAGNOSIS — C61 Malignant neoplasm of prostate: Secondary | ICD-10-CM

## 2023-01-22 NOTE — Progress Notes (Unsigned)
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 Gleason 4+4 adenocarcinoma 05/2019: TURP with Jonathan Austin 10/22/2019: completed IMRT 05/10/2022: CT abdomen/pelvis w/ contrast showed stable exam. No evidence of metastatic disease within the abdomen or pelvis. 05/19/2022: NM PET (PSMA) showed focus of radiotracer accumulation in posterior prostatic apex, consistent with recurrent prostate carcinoma as well as sub-centimeter abdominal retroperitoneal lymph nodes show radiotracer accumulation, consistent with lymph node metastases 08/01/2022: establish care with Jonathan. Leonides Austin.  Started Zytiga 1000 mg p.o. daily with prednisone 5 mg p.o. daily  Interval History:  Jonathan Austin 57 y.o. male with medical history significant for metastatic castrate sensitive prostate cancer who presents for a follow up visit. The patient's last visit was on 09/26/2022. In the interim since the last visit he has had no major changes in his health and continues on his Zytiga pills.  On exam today Jonathan Austin is accompanied by his wife.  He reports he is faithfully taking his Zytiga 1000 mg p.o. daily with a steroid pill.  He is not having any side effects other than hot flashes.  He reports that the hot flashes last for about 2 to 3 minutes and he is able to cool off quickly.  He notes he is not having any nausea, vomiting, or diarrhea.  He is having some numbness of his feet and reports that typically it occurs when he wakes up but improves as he is walking.  He reports he is eating well with a strong energy of 10 out of 10.  He notes he does have some occasional itching in his underarm area and thinks it may be due to  ingrown hairs.  Jonathan Austin reports otherwise he is willing and able to proceed with treatment today and that he feels quite well.  He otherwise denies any fevers, chills, sweats, nausea, vomiting or diarrhea.  A full 10 point ROS is otherwise negative.  MEDICAL HISTORY:  Past Medical History:  Diagnosis Date   Alcohol use disorder, severe, dependence (HCC)    05-24-2019 recent [facility] admission for suicide ideation and alcohol / cocaine detox 05-04-2019 in epic,  pt stated has been taking naltrexone daily as prescribed since and no alcohol since   Cocaine abuse (HCC)    05-24-2019  pt had recent [facility] admission, pt stated last used  "crack" 05-03-2019   Cocaine abuse with cocaine-induced mood disorder (HCC)    Depression    GERD (gastroesophageal reflux disease)    History of suicidal ideation    multiple [facility] admission's;  last one admitted 05-04-2019   Hyperplasia of prostate with lower urinary tract symptoms (LUTS)    Hypertension    followed by pcp   OSA on CPAP    uses intermittently   Phimosis    Prostate cancer [facility]) urologist-- Jonathan Austin   dx 02/ 2021;  localized advanced, gleason 4+4, PSA 317   Renal insufficiency    Sinus tachycardia    Thoracic aortic atherosclerosis (HCC)    Type 2 diabetes mellitus treated with insulin (HCC)    followed by pcp   (05-24-2019  per stated checks blood sugar twice daily, fasting sugar-- 180-200)   Wears glasses     SURGICAL HISTORY: Past Surgical History:  Procedure Laterality Date   CIRCUMCISION N/A 05/29/2019   Procedure: CIRCUMCISION ADULT;  Surgeon: Rene Paci, MD;  Location: [facility];  Service: Urology;  Laterality: N/A;   CYSTOSCOPY WITH URETHRAL DILATATION N/A 06/08/2021   Procedure: CYSTOSCOPY WITH URETHRAL DILATATION;  Surgeon: Riki Altes, MD;  Location: [facility];  Service: Urology;  Laterality: N/A;   PROSTATE BIOPSY     TONSILLECTOMY  child   TRANSURETHRAL RESECTION OF PROSTATE N/A 05/29/2019    Procedure: TRANSURETHRAL RESECTION OF THE PROSTATE (TURP)/ CYSTOSCOPY;  Surgeon: Rene Paci, MD;  Location: [facility];  Service: Urology;  Laterality: N/A;    SOCIAL HISTORY: Social History   Socioeconomic History   Marital status: Married    Spouse name: Jonathan Austin   Number of children: 1   Years of education: 12   Highest education level: GED or equivalent  Occupational History   Not on file  Tobacco Use   Smoking status: Never    Passive exposure: Never   Smokeless tobacco: Never  Vaping Use   Vaping status: Never Used  Substance and Sexual Activity   Alcohol use: Yes    Alcohol/week: 2.0 standard drinks of alcohol    Types: 2 Cans of beer per week    Comment: drinks beer 40 oz x 2 daily  (05-24-2019 per pt no alcohol since 05-03-2019)   Drug use: Yes    Types: Cocaine, Marijuana    Comment: smokes crack cocaine   Sexual activity: Not Currently  Other Topics Concern   Not on file  Social History Narrative   Only child is deceased   Social Drivers of Corporate investment banker Strain: Not on file  Food Insecurity: No Food Insecurity (04/11/2022)   Hunger Vital Sign    Worried About Running Out of Food in the Last Year: Never true    Ran Out of Food in the Last Year: Never true  Transportation Needs: No Transportation Needs (04/11/2022)   PRAPARE - Administrator, Civil Service (Medical): No    Lack of Transportation (Non-Medical): No  Physical Activity: Not on file  Stress: Not on file  Social Connections: Not on file  Intimate Partner Violence: Not At Risk (04/11/2022)   Humiliation, Afraid, Rape, and Kick questionnaire    Fear of Current or Ex-Partner: No    Emotionally Abused: No    Physically Abused: No    Sexually Abused: No    FAMILY HISTORY: Family History  Problem Relation Age of Onset   Hypertension Mother    Hypertension Father    Diabetes Father    Prostate cancer Maternal Grandfather 17   Breast cancer Neg  Hx    Colon cancer Neg Hx    Pancreatic cancer Neg Hx     ALLERGIES:  has no known allergies.  MEDICATIONS:  Current Outpatient Medications  Medication Sig Dispense Refill   abiraterone acetate (ZYTIGA) 250 MG tablet Take 4 tablets (1,000 mg total) by mouth daily. Take on an empty stomach 1 hour before or 2 hours after a meal 120 tablet 0   ACCU-CHEK GUIDE test strip USE UP TO 4 TIMES A DAY AS DIRECTED     Accu-Chek Softclix Lancets lancets SMARTSIG:Topical 1-4 Times Daily     blood glucose meter kit and supplies Dispense based on patient and insurance preference. Use up to four times daily as directed. (FOR ICD-10 E10.9, E11.9). 1 each 0   insulin aspart protamine - aspart (NOVOLOG MIX  70/30 FLEXPEN) (70-30) 100 UNIT/ML FlexPen Inject 20 Units into the skin 2 (two) times daily. (Patient not taking: Reported on 01/02/2023) 15 mL 11   Insulin Pen Needle 31G X 5 MM MISC 1 Needle by Does not apply route 2 (two) times daily. 100 each 0   NONFORMULARY OR COMPOUNDED ITEM Trimix (30/1/10)-(Pap/Phent/PGE)  Test Dose  1ml vial   Qty #3 Refills 0  [facility] [phone] Fax [phone] 3 each 0   OZEMPIC, 0.25 OR 0.5 MG/DOSE, 2 MG/3ML SOPN SMARTSIG:0.25 Milligram(s) SUB-Q Once a Week     predniSONE (DELTASONE) 5 MG tablet Take 1 tablet (5 mg total) by mouth daily with breakfast. 90 tablet 1   rosuvastatin (CRESTOR) 20 MG tablet Take 20 mg by mouth daily.     No current facility-administered medications for this visit.    REVIEW OF SYSTEMS:   Constitutional: ( - ) fevers, ( - )  chills , ( - ) night sweats Eyes: ( - ) blurriness of vision, ( - ) double vision, ( - ) watery eyes Ears, nose, mouth, throat, and face: ( - ) mucositis, ( - ) sore throat Respiratory: ( - ) cough, ( - ) dyspnea, ( - ) wheezes Cardiovascular: ( - ) palpitation, ( - ) chest discomfort, ( - ) lower extremity swelling Gastrointestinal:  ( - ) nausea, ( - ) heartburn, ( - ) change in bowel habits Skin: (  - ) abnormal skin rashes Lymphatics: ( - ) new lymphadenopathy, ( - ) easy bruising Neurological: ( - ) numbness, ( - ) tingling, ( - ) new weaknesses Behavioral/Psych: ( - ) mood change, ( - ) new changes  All other systems were reviewed with the patient and are negative.  PHYSICAL EXAMINATION:  There were no vitals filed for this visit.   There were no vitals filed for this visit.    GENERAL: Well-appearing middle-aged African-American male, alert, no distress and comfortable SKIN: skin color, texture, turgor are normal, no rashes or significant lesions EYES: conjunctiva are pink and non-injected, sclera clear LUNGS: clear to auscultation and percussion with normal breathing effort HEART: regular rate & rhythm and no murmurs and no lower extremity edema Musculoskeletal: no cyanosis of digits and no clubbing  PSYCH: alert & oriented x 3, fluent speech NEURO: no focal motor/sensory deficits  LABORATORY DATA:  I have reviewed the data as listed    Latest Ref Rng & Units 10/31/2022    2:37 PM 09/26/2022    2:31 PM 09/16/2022    5:02 PM  CBC  WBC 4.0 - 10.5 K/uL 4.9  6.1  5.7   Hemoglobin 13.0 - 17.0 g/dL 01.0  27.2  53.6   Hematocrit 39.0 - 52.0 % 36.2  34.0  42.5   Platelets 150 - 400 K/uL 329  509  323        Latest Ref Rng & Units 10/31/2022    2:37 PM 09/26/2022    2:31 PM 09/16/2022    5:02 PM  CMP  Glucose 70 - 99 mg/dL 644  034  742   BUN 6 - 20 mg/dL 17  14  14    Creatinine 0.61 - 1.24 mg/dL 5.95  6.38  7.56   Sodium 135 - 145 mmol/L 137  138  133   Potassium 3.5 - 5.1 mmol/L 4.8  4.2  4.2   Chloride 98 - 111 mmol/L 102  104  96   CO2 22 - 32 mmol/L 29  25  27   Calcium 8.9 - 10.3 mg/dL 91.4  8.9  9.4   Total Protein 6.5 - 8.1 g/dL 7.7  7.1  8.0   Total Bilirubin 0.3 - 1.2 mg/dL 0.5  0.3  0.8   Alkaline Phos 38 - 126 U/L 77  74  95   AST 15 - 41 U/L 14  14  23    ALT 0 - 44 U/L 22  16  31     RADIOGRAPHIC STUDIES: No results found.  ASSESSMENT & PLAN Jonathan Austin 57 y.o. male with medical history significant for metastatic castrate sensitive prostate cancer who presents for a follow up visit.  After review of the labs, review of the records, and discussion with the patient the patients findings are most consistent with castrate sensitive prostate cancer with metastatic spread to the lymph nodes.   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 First dose on 08/08/2022. Dose to be administered today. Next due in Dec 2024.  -- labs today show WBC 4.9, hemoglobin 12.3, MCV 87.7, and platelets of 329. PSA on 09/16/2022 down to 0.4.  --RTC q 12-week visits to coincide with his Lupron shots.   #Polysubstance Abuse -- patient drinks 6 pack of beer daily  -- patient smokes 1 g of crack cocaine daily -- discussed cutting back/cessation of substances today    No orders of the defined types were placed in this encounter.   All questions were answered. The patient knows to call the clinic with any problems, questions or concerns.  A total of more than 30 minutes were spent on this encounter with face-to-face time and non-face-to-face time, including preparing to see the patient, ordering tests and/or medications, counseling the patient and coordination of care as outlined above.   Ulysees Barns, MD Department of Hematology/Oncology [facility] at [facility] [place] Phone: [phone] Pager: [phone] Email: [email or web address]  01/22/2023 10:29 PM

## 2023-01-23 ENCOUNTER — Inpatient Hospital Stay: Payer: MEDICAID

## 2023-01-23 ENCOUNTER — Inpatient Hospital Stay (HOSPITAL_BASED_OUTPATIENT_CLINIC_OR_DEPARTMENT_OTHER): Payer: MEDICAID | Admitting: Hematology and Oncology

## 2023-01-23 ENCOUNTER — Other Ambulatory Visit: Payer: Self-pay

## 2023-01-23 ENCOUNTER — Inpatient Hospital Stay: Payer: MEDICAID | Attending: Hematology and Oncology

## 2023-01-23 ENCOUNTER — Telehealth: Payer: Self-pay

## 2023-01-23 VITALS — BP 155/101 | HR 95 | Temp 97.7°F | Resp 16 | Wt 236.8 lb

## 2023-01-23 DIAGNOSIS — F1410 Cocaine abuse, uncomplicated: Secondary | ICD-10-CM | POA: Insufficient documentation

## 2023-01-23 DIAGNOSIS — C61 Malignant neoplasm of prostate: Secondary | ICD-10-CM | POA: Insufficient documentation

## 2023-01-23 DIAGNOSIS — R232 Flushing: Secondary | ICD-10-CM | POA: Diagnosis not present

## 2023-01-23 DIAGNOSIS — Z7952 Long term (current) use of systemic steroids: Secondary | ICD-10-CM | POA: Diagnosis not present

## 2023-01-23 DIAGNOSIS — F1010 Alcohol abuse, uncomplicated: Secondary | ICD-10-CM | POA: Insufficient documentation

## 2023-01-23 DIAGNOSIS — C772 Secondary and unspecified malignant neoplasm of intra-abdominal lymph nodes: Secondary | ICD-10-CM | POA: Insufficient documentation

## 2023-01-23 DIAGNOSIS — N3946 Mixed incontinence: Secondary | ICD-10-CM

## 2023-01-23 LAB — CMP (CANCER CENTER ONLY)
ALT: 22 U/L (ref 0–44)
AST: 24 U/L (ref 15–41)
Albumin: 4 g/dL (ref 3.5–5.0)
Alkaline Phosphatase: 61 U/L (ref 38–126)
Anion gap: 3 — ABNORMAL LOW (ref 5–15)
BUN: 11 mg/dL (ref 6–20)
CO2: 29 mmol/L (ref 22–32)
Calcium: 9.5 mg/dL (ref 8.9–10.3)
Chloride: 107 mmol/L (ref 98–111)
Creatinine: 0.86 mg/dL (ref 0.61–1.24)
GFR, Estimated: >60 mL/min (ref >60)
Glucose, Bld: 79 mg/dL (ref 70–99)
Potassium: 3.9 mmol/L (ref 3.5–5.1)
Sodium: 139 mmol/L (ref 135–145)
Total Bilirubin: 0.3 mg/dL (ref <1.2)
Total Protein: 7.1 g/dL (ref 6.5–8.1)

## 2023-01-23 LAB — CBC WITH DIFFERENTIAL (CANCER CENTER ONLY)
Abs Immature Granulocytes: 0.01 10*3/uL (ref 0.00–0.07)
Basophils Absolute: 0 10*3/uL (ref 0.0–0.1)
Basophils Relative: 0 %
Eosinophils Absolute: 0.2 10*3/uL (ref 0.0–0.5)
Eosinophils Relative: 4 %
HCT: 36.7 % — ABNORMAL LOW (ref 39.0–52.0)
Hemoglobin: 12.3 g/dL — ABNORMAL LOW (ref 13.0–17.0)
Immature Granulocytes: 0 %
Lymphocytes Relative: 34 %
Lymphs Abs: 1.5 10*3/uL (ref 0.7–4.0)
MCH: 29.9 pg (ref 26.0–34.0)
MCHC: 33.5 g/dL (ref 30.0–36.0)
MCV: 89.1 fL (ref 80.0–100.0)
Monocytes Absolute: 0.4 10*3/uL (ref 0.1–1.0)
Monocytes Relative: 10 %
Neutro Abs: 2.3 10*3/uL (ref 1.7–7.7)
Neutrophils Relative %: 52 %
Platelet Count: 326 10*3/uL (ref 150–400)
RBC: 4.12 MIL/uL — ABNORMAL LOW (ref 4.22–5.81)
RDW: 13.1 % (ref 11.5–15.5)
WBC Count: 4.5 10*3/uL (ref 4.0–10.5)
nRBC: 0 % (ref 0.0–0.2)

## 2023-01-23 MED ORDER — AMLODIPINE BESYLATE 5 MG PO TABS: 5.0000 mg | ORAL_TABLET | Freq: Every day | ORAL | 1 refills | Status: DC

## 2023-01-23 MED ORDER — LEUPROLIDE ACETATE (3 MONTH) 22.5 MG ~~LOC~~ KIT
22.5000 mg | PACK | Freq: Once | SUBCUTANEOUS | Status: AC
  Administered 2023-01-23: 22.5 mg via SUBCUTANEOUS

## 2023-01-23 NOTE — Telephone Encounter (Signed)
Per Dr. Sherron Monday, Patient is to be scheduled for  Percutaneous Temporary Placement for  Peripheral Nerve Evaluation  Mr. Chickering was contacted and possible surgical dates were discussed, Monday January 6th, 2025 was agreed upon for surgery.   Patient was directed to call [phone] between 1-3pm the day before surgery to find out surgical arrival time.  Instructions were given not to eat or drink from midnight on the night before surgery and have a driver for the day of surgery. On the surgery day patient was instructed to enter through the Medical Mall entrance of [facility] report the Same Day Surgery desk.   Pre-Admit Testing will be in contact via phone to set up an interview with the anesthesia team to review your history and medications prior to surgery.   Reminder of this information was sent by mail to the patient.

## 2023-01-23 NOTE — Progress Notes (Signed)
   [facility] Surgical Posting Form  Surgery Date: Date: 02/13/2023  Surgeon: Dr. Alfredo Martinez, MD  Inpt ( No  )   Outpt (Yes)   Obs ( No  )   Diagnosis: N39.46 Urge Incontinence  -CPT: [phone]  Surgery: Percutaneous Temporary Placement for  Peripheral Nerve Evaluation   Stop Anticoagulations: No  Cardiac/Medical/Pulmonary Clearance needed: no  *Orders entered into EPIC  Date: 01/23/2023    *Case booked in [place]  Date: 01/20/2023  *Notified pt of Surgery: Date: 01/20/2023  PRE-OP UA & CX: no  *Placed into Prior Authorization Work [place] Date: 01/23/23  Assistant/laser/rep:Yes, [employer] Rep to be present for case. Confirmed on 01/19/2023

## 2023-01-24 LAB — TESTOSTERONE: Testosterone: <3 ng/dL — ABNORMAL LOW (ref 264–916)

## 2023-01-24 LAB — PROSTATE-SPECIFIC AG, SERUM (LABCORP): Prostate Specific Ag, Serum: <0.1 ng/mL (ref 0.0–4.0)

## 2023-02-03 ENCOUNTER — Other Ambulatory Visit: Payer: Self-pay

## 2023-02-03 ENCOUNTER — Encounter
Admission: RE | Admit: 2023-02-03 | Discharge: 2023-02-03 | Disposition: A | Discharge status: Home / Self Care | Payer: MEDICAID | Source: Ambulatory Visit | Attending: Urology

## 2023-02-03 ENCOUNTER — Telehealth: Payer: Self-pay | Admitting: *Deleted

## 2023-02-03 VITALS — Ht 67.0 in | Wt 230.0 lb

## 2023-02-03 DIAGNOSIS — Z01812 Encounter for preprocedural laboratory examination: Secondary | ICD-10-CM

## 2023-02-03 DIAGNOSIS — E1142 Type 2 diabetes mellitus with diabetic polyneuropathy: Secondary | ICD-10-CM

## 2023-02-03 DIAGNOSIS — Z01810 Encounter for preprocedural cardiovascular examination: Secondary | ICD-10-CM

## 2023-02-03 DIAGNOSIS — I10 Essential (primary) hypertension: Secondary | ICD-10-CM

## 2023-02-03 HISTORY — DX: Mixed incontinence: N39.46

## 2023-02-03 HISTORY — DX: Hyperlipidemia, unspecified: E78.5

## 2023-02-03 NOTE — Telephone Encounter (Signed)
TCT patient regarding recent lab results. Spoke with pt. Advised that his PSA and testosterone are both undetectable. These are excellent results. We will plan to see him back in March 2025 as scheduled. Pt pleased with results and is awarwe of his appts in March 2025

## 2023-02-03 NOTE — Telephone Encounter (Signed)
-----   Message from Ulysees Barns IV sent at 02/02/2023  1:24 PM EST ----- Please let Mr. Jonathan Austin know that his PSA and testosterone are both undetectable.  These are excellent results.  We will plan to see him back in March 2025 as scheduled. ----- Message ----- From: Leory Plowman, Lab In [place] Sent: 01/23/2023  11:03 AM EST To: Jaci Standard, MD

## 2023-02-03 NOTE — Patient Instructions (Addendum)
Your procedure is scheduled on: Monday, January 6 Report to the Registration Desk on the 1st floor of the [facility] To find out your arrival time, please call [phone] between 1PM - 3PM on: Friday, January 3 If your arrival time is 6:00 am, do not arrive before that time as the Medical Mall entrance doors do not open until 6:00 am.  REMEMBER: Instructions that are not followed completely may result in serious medical risk, up to and including death; or upon the discretion of your surgeon and anesthesiologist your surgery may need to be rescheduled.  Do not eat or drink after midnight the night before surgery.  No gum chewing or hard candies.  One week prior to surgery: starting December 30 Stop Anti-inflammatories (NSAIDS) such as Advil, Aleve, Ibuprofen, Motrin, Naproxen, Naprosyn and Aspirin based products such as Excedrin, Goody's Powder, BC Powder. Stop ANY OVER THE COUNTER supplements until after surgery.  You may however, continue to take Tylenol if needed for pain up until the day of surgery.  Ozempic - hold for 7 days before surgery. Do NOT take your Ozempic on Sunday, January 5. Resume AFTER surgery on your regular weekly day, Sunday, January 12.  Continue taking all of your other prescription medications up until the day of surgery.  Do NOT take ANY insulin on the morning of surgery.  ON THE DAY OF SURGERY ONLY TAKE THESE MEDICATIONS WITH SIPS OF WATER:  abiraterone acetate (ZYTIGA)  amLODipine (NORVASC)  fluticasone (FLONASE) 50 MCG/ACT nasal spray  predniSONE (DELTASONE)  rosuvastatin (CRESTOR)   No Alcohol for 24 hours before or after surgery.  No Smoking including e-cigarettes for 24 hours before surgery.  No chewable tobacco products for at least 6 hours before surgery.  No nicotine patches on the day of surgery.  Do not use any "recreational" drugs for at least a week (preferably 2 weeks) before your surgery.  Please be advised that the combination of  cocaine and anesthesia may have negative outcomes, up to and including death. If you test positive for cocaine, your surgery will be cancelled.  On the morning of surgery brush your teeth with toothpaste and water, you may rinse your mouth with mouthwash if you wish. Do not swallow any toothpaste or mouthwash.  Use CHG Soap as directed on instruction sheet.  Do not wear jewelry, make-up, hairpins, clips or nail polish.  For welded (permanent) jewelry: bracelets, anklets, waist bands, etc.  Please have this removed prior to surgery.  If it is not removed, there is a chance that hospital personnel will need to cut it off on the day of surgery.  Do not wear lotions, powders, or perfumes.   Do not shave body hair from the neck down 48 hours before surgery.  Contact lenses, hearing aids and dentures may not be worn into surgery.  Do not bring valuables to the hospital. [facility] is not responsible for any missing/lost belongings or valuables.   Bring your C-PAP to the hospital in case you may have to spend the night.   Notify your doctor if there is any change in your medical condition (cold, fever, infection).  Wear comfortable clothing (specific to your surgery type) to the hospital.  After surgery, you can help prevent lung complications by doing breathing exercises.  Take deep breaths and cough every 1-2 hours.   If you are being discharged the day of surgery, you will not be allowed to drive home. You will need a responsible individual to drive you  home and stay with you for 24 hours after surgery.   If you are taking public transportation, you will need to have a responsible individual with you.  Please call the Pre-admissions Testing Dept. at [phone] if you have any questions about these instructions.  Surgery Visitation Policy:  Patients having surgery or a procedure may have two visitors.  Children under the age of 46 must have an adult with them who is not the  patient.     Preparing for Surgery with CHLORHEXIDINE GLUCONATE (CHG) Soap  Chlorhexidine Gluconate (CHG) Soap  o An antiseptic cleaner that kills germs and bonds with the skin to continue killing germs even after washing  o Used for showering the night before surgery and morning of surgery  Before surgery, you can play an important role by reducing the number of germs on your skin.  CHG (Chlorhexidine gluconate) soap is an antiseptic cleanser which kills germs and bonds with the skin to continue killing germs even after washing.  Please do not use if you have an allergy to CHG or antibacterial soaps. If your skin becomes reddened/irritated stop using the CHG.  1. Shower the NIGHT BEFORE SURGERY and the MORNING OF SURGERY with CHG soap.  2. If you choose to wash your hair, wash your hair first as usual with your normal shampoo.  3. After shampooing, rinse your hair and body thoroughly to remove the shampoo.  4. Use CHG as you would any other liquid soap. You can apply CHG directly to the skin and wash gently with a scrungie or a clean washcloth.  5. Apply the CHG soap to your body only from the neck down. Do not use on open wounds or open sores. Avoid contact with your eyes, ears, mouth, and genitals (private parts). Wash face and genitals (private parts) with your normal soap.  6. Wash thoroughly, paying special attention to the area where your surgery will be performed.  7. Thoroughly rinse your body with warm water.  8. Do not shower/wash with your normal soap after using and rinsing off the CHG soap.  9. Pat yourself dry with a clean towel.  10. Wear clean pajamas to bed the night before surgery.  12. Place clean sheets on your bed the night of your first shower and do not sleep with pets.  13. Shower again with the CHG soap on the day of surgery prior to arriving at the hospital.  14. Do not apply any deodorants/lotions/powders.  15. Please wear clean clothes to the  hospital.

## 2023-02-06 ENCOUNTER — Encounter
Admission: RE | Admit: 2023-02-06 | Discharge: 2023-02-06 | Disposition: A | Discharge status: Home / Self Care | Payer: MEDICAID | Source: Ambulatory Visit | Attending: Urology | Admitting: Urology

## 2023-02-06 ENCOUNTER — Encounter: Payer: Self-pay | Admitting: Urgent Care

## 2023-02-06 ENCOUNTER — Encounter: Payer: Self-pay | Admitting: Anesthesiology

## 2023-02-06 DIAGNOSIS — I10 Essential (primary) hypertension: Secondary | ICD-10-CM | POA: Diagnosis not present

## 2023-02-06 DIAGNOSIS — Z01810 Encounter for preprocedural cardiovascular examination: Secondary | ICD-10-CM | POA: Insufficient documentation

## 2023-02-06 DIAGNOSIS — E1142 Type 2 diabetes mellitus with diabetic polyneuropathy: Secondary | ICD-10-CM | POA: Diagnosis not present

## 2023-02-06 DIAGNOSIS — Z01812 Encounter for preprocedural laboratory examination: Secondary | ICD-10-CM

## 2023-02-06 NOTE — Progress Notes (Signed)
Received GLP-1 form back from PCP (Eli Hammer)-No further recommendations -ok to hold prescribed GLP-1 (Ozempic) medication as per ASA guidelines

## 2023-02-10 ENCOUNTER — Telehealth: Payer: Self-pay

## 2023-02-10 NOTE — Telephone Encounter (Signed)
 LM informing pt that there was an issue with his insurance regarding his surgery and it was urgent that he contact the office.    Pt called triage line-   Pt aware that insurance has not auth'ed his surgery. Per Dr.  Linwood ok to post pone. Pt aware we will call once surgery is approved. OR aware to cx. [facility] to contact rep. Will send to Melissa for f/u.

## 2023-02-13 ENCOUNTER — Encounter: Admission: RE | Payer: Self-pay | Source: Home / Self Care

## 2023-02-13 ENCOUNTER — Ambulatory Visit: Admission: RE | Admit: 2023-02-13 | Payer: MEDICAID | Source: Home / Self Care | Admitting: Urology

## 2023-02-13 SURGERY — INTERSTIM IMPLANT FIRST STAGE
Anesthesia: Monitor Anesthesia Care

## 2023-02-14 ENCOUNTER — Other Ambulatory Visit: Payer: Self-pay

## 2023-02-14 ENCOUNTER — Other Ambulatory Visit: Payer: Self-pay | Admitting: Hematology and Oncology

## 2023-02-14 ENCOUNTER — Other Ambulatory Visit (HOSPITAL_COMMUNITY): Payer: Self-pay

## 2023-02-14 ENCOUNTER — Other Ambulatory Visit (HOSPITAL_COMMUNITY): Payer: Self-pay | Admitting: Pharmacy Technician

## 2023-02-14 MED ORDER — ABIRATERONE ACETATE 250 MG PO TABS
1000.0000 mg | ORAL_TABLET | Freq: Every day | ORAL | 0 refills | Status: DC
  Filled 2023-02-14: qty 120, 30d supply, fill #0

## 2023-02-14 NOTE — Progress Notes (Signed)
 Specialty Pharmacy Refill Coordination Note  Jonathan Austin is a 58 y.o. male contacted today regarding refills of specialty medication(s) Abiraterone  Acetate (ZYTIGA )  Spoke with Wife  Patient requested Delivery   Delivery date: 02/22/23   Verified address: Patient address [address]  [place]   Medication will be filled on 02/21/23.  Refill request sent to MD; call if any delays

## 2023-02-20 ENCOUNTER — Encounter: Payer: MEDICAID | Admitting: Urology

## 2023-02-21 ENCOUNTER — Other Ambulatory Visit: Payer: Self-pay

## 2023-02-27 ENCOUNTER — Inpatient Hospital Stay: Admission: RE | Admit: 2023-02-27 | Payer: MEDICAID | Source: Ambulatory Visit

## 2023-03-01 ENCOUNTER — Ambulatory Visit: Payer: MEDICAID | Admitting: Podiatry

## 2023-03-01 ENCOUNTER — Encounter: Payer: Self-pay | Admitting: Podiatry

## 2023-03-01 ENCOUNTER — Encounter
Admission: RE | Admit: 2023-03-01 | Discharge: 2023-03-01 | Disposition: A | Discharge status: Home / Self Care | Payer: MEDICAID | Source: Ambulatory Visit | Attending: Urology | Admitting: Urology

## 2023-03-01 DIAGNOSIS — B351 Tinea unguium: Secondary | ICD-10-CM | POA: Diagnosis not present

## 2023-03-01 DIAGNOSIS — M79674 Pain in right toe(s): Secondary | ICD-10-CM | POA: Diagnosis not present

## 2023-03-01 DIAGNOSIS — M79675 Pain in left toe(s): Secondary | ICD-10-CM

## 2023-03-01 DIAGNOSIS — E1142 Type 2 diabetes mellitus with diabetic polyneuropathy: Secondary | ICD-10-CM

## 2023-03-01 NOTE — Progress Notes (Signed)
This patient returns to my office for at risk foot care.  This patient requires this care by a professional since this patient will be at risk due to having diabetes.  This patient is unable to cut nails himself since the patient cannot reach his nails.These nails are painful walking and wearing shoes.  He presents to the office with male caregiver.This patient presents for at risk foot care today.  General Appearance  Alert, conversant and in no acute stress.  Vascular  Dorsalis pedis and posterior tibial  pulses are palpable  bilaterally.  Capillary return is within normal limits  bilaterally. Temperature is within normal limits  bilaterally.  Neurologic  Senn-Weinstein monofilament wire test diminished  bilaterally. Muscle power within normal limits bilaterally.  Nails Thick disfigured discolored nails with subungual debris  from hallux to fifth toes bilaterally. No evidence of bacterial infection or drainage bilaterally.  Orthopedic  No limitations of motion  feet .  No crepitus or effusions noted.  No bony pathology or digital deformities noted.  Skin  normotropic skin with no porokeratosis noted bilaterally.  No signs of infections or ulcers noted.     Onychomycosis  Pain in right toes  Pain in left toes  Consent was obtained for treatment procedures.   Mechanical debridement of nails 1-5  bilaterally performed with a nail nipper.  Filed with dremel without incident.    Return office visit     3 months                Told patient to return for periodic foot care and evaluation due to potential at risk complications.   Helane Gunther DPM

## 2023-03-01 NOTE — Patient Instructions (Addendum)
Your procedure is scheduled on: Mar 06, 2023  Report to the Registration Desk on the 1st floor of the [facility] To find out your arrival time, please call [phone] between 1PM - 3PM on: Jan 24, Friday If your arrival time is 6:00 am, do not arrive before that time as the [facility] entrance doors do not open until 6:00 am.  REMEMBER: Instructions that are not followed completely may result in serious medical risk, up to and including death; or upon the discretion of your surgeon and anesthesiologist your surgery may need to be rescheduled.  Do not eat food after midnight the night before surgery.  No gum chewing or hard candies.     Stop Anti-inflammatories (NSAIDS) such as Advil, Aleve, Ibuprofen, Motrin, Naproxen, Naprosyn and Aspirin based products such as Excedrin, Goody's Powder, BC Powder. Stop ANY OVER THE COUNTER supplements until after surgery.  You may however, continue to take Tylenol if needed for pain up until the day of surgery.  **Follow guidelines for insulin and diabetes medications. No changed    Continue taking all of your other prescription medications up until the day of surgery.  ON THE DAY OF SURGERY ONLY TAKE THESE MEDICATIONS WITH SIPS OF WATER:  amLODipine (NORVASC) abiraterone acetate (ZYTIGA) predniSONE (DELTASONE      No Alcohol for 24 hours before or after surgery.  No Smoking including e-cigarettes for 24 hours before surgery.  No chewable tobacco products for at least 6 hours before surgery.  No nicotine patches on the day of surgery.  Do not use any "recreational" drugs for at least a week (preferably 2 weeks) before your surgery.  Please be advised that the combination of cocaine and anesthesia may have negative outcomes, up to and including death. If you test positive for cocaine, your surgery will be cancelled.  On the morning of surgery brush your teeth with toothpaste and water, you may rinse your mouth with mouthwash if  you wish. Do not swallow any toothpaste or mouthwash.  Use CHG Soap or wipes as directed on instruction sheet.  Do not wear jewelry, make-up, hairpins, clips or nail polish.  For welded (permanent) jewelry: bracelets, anklets, waist bands, etc.  Please have this removed prior to surgery.  If it is not removed, there is a chance that hospital personnel will need to cut it off on the day of surgery.  Do not wear lotions, powders, or perfumes.   Do not shave body hair from the neck down 48 hours before surgery.  Contact lenses, hearing aids and dentures may not be worn into surgery.  Do not bring valuables to the hospital. [facility] is not responsible for any missing/lost belongings or valuables.    Bring your C-PAP to the hospital in case you may have to spend the night.   Notify your doctor if there is any change in your medical condition (cold, fever, infection).  Wear comfortable clothing (specific to your surgery type) to the hospital.  After surgery, you can help prevent lung complications by doing breathing exercises.  Take deep breaths and cough every 1-2 hours. Your doctor may order a device called an Incentive Spirometer to help you take deep breaths.  If you are being admitted to the hospital overnight, leave your suitcase in the car. After surgery it may be brought to your room.  In case of increased patient census, it may be necessary for you, the patient, to continue your postoperative care in the Same Day Surgery department.  If you are being discharged the day of surgery, you will not be allowed to drive home. You will need a responsible individual to drive you home and stay with you for 24 hours after surgery.    Please call the Pre-admissions Testing Dept. at [phone] if you have any questions about these instructions.  Surgery Visitation Policy:  Patients having surgery or a procedure may have two visitors.  Children under the age of 38 must have an  adult with them who is not the patient.  Temporary Visitor Restrictions Due to increasing cases of flu, RSV and COVID-19: Children ages 37 and under will not be able to visit patients in [facility] hospitals under most circumstances.  Inpatient Visitation:    Visiting hours are 7 a.m. to 8 p.m. Up to four visitors are allowed at one time in a patient room. The visitors may rotate out with other people during the day.  One visitor age 83 or older may stay with the patient overnight and must be in the room by 8 p.m.

## 2023-03-01 NOTE — Pre-Procedure Instructions (Signed)
Pre- admit interview completed and updated instructions given to patient and he verbalized understanding.

## 2023-03-02 NOTE — Progress Notes (Signed)
  Perioperative Services Pre-Admission/Anesthesia Testing    Date: 03/02/23  Name: Jonathan Austin MRN:   [id number]  Re: GLP-1 clearance and provider recommendations   Planned Surgical Procedure(s):    Case: [id number] Date/Time: 03/06/23 1245   Procedure: Percutaneous Temmporary Placement for Peripheral Nerve Evaluation   Anesthesia type: Monitor Anesthesia Care   Pre-op diagnosis: Refractory Urge Incontinence   Location: [facility] OR ROOM 08 / [facility] ORS FOR ANESTHESIA GROUP   Surgeons: Alfredo Martinez, MD      Clinical Notes:  Patient is scheduled for the above procedure with the indicated provider/surgeon. In review of his medication reconciliation it was noted that patient is on a prescribed GLP-1 medication. Per guidelines issued by the American Society of Anesthesiologists (ASA), it is recommended that these medications be held for 7 days prior to the patient undergoing any type of elective surgical procedure. The patient is taking the following GLP-1 medication:  [x]  SEMAGLUTIDE   []  EXENATIDE  []  LIRAGLUTIDE   []  LIXISENATIDE  []  DULAGLUTIDE     []  TIRZEPATIDE (GLP-1/GIP)  Reached out to prescribing provider Jonathan Chamber, MD) to make them aware of the guidelines from anesthesia. Given that this patient takes the prescribed GLP-1 medication for his  diabetes diagnosis, rather than for weight loss, recommendations from the prescribing provider were solicited. Prescribing provider made aware of the following so that informed decision/POC can be developed for this patient that may be taking medications belonging to these drug classes:  Oral GLP-1 medications will be held 1 day prior to surgery.  Injectable GLP-1 medications will be held 7 days prior to surgery.  Metformin is routinely held 48 hours prior to surgery due to renal concerns, potential need for contrasted imaging perioperatively, and the potential for tissue hypoxia leading to drug induced lactic acidosis.  All SGLT2i  medications are held 72 hours prior to surgery as they can be associated with the increased potential for developing euglycemic diabetic ketoacidosis (EDKA).   Impression and Plan:  Jonathan Austin is on a prescribed GLP-1 medication, which induces the known side effect of decreased gastric emptying. Efforts are bring made to mitigate the risk of perioperative hyperglycemic events, as elevated blood glucose levels have been found to contribute to intra/postoperative complications. Additionally, hyperglycemic extremes can potentially necessitate the postponing of a patient's elective case in order to better optimize perioperative glycemic control, again with the aforementioned guidelines in place. With this in mind, recommendations have been sought from the prescribing provider, who has cleared patient to proceed with holding the prescribed GLP-1 as per the guidelines from the ASA.   Provider recommending: no further recommendations received from the prescribing provider.  Copy of signed clearance and recommendations placed on patient's chart for inclusion in their medical record and for review by the surgical/anesthetic team on the day of his procedure.   Quentin Mulling, MSN, APRN, FNP-C, CEN [facility]  Perioperative Services Nurse Practitioner Phone: [phone] Fax: [phone] 03/02/23 10:20 AM  NOTE: This note has been prepared using Dragon dictation software. Despite my best ability to proofread, there is always the potential that unintentional transcriptional errors may still occur from this process.

## 2023-03-06 ENCOUNTER — Encounter
Admission: RE | Disposition: A | Discharge status: Home / Self Care | Payer: Self-pay | Source: Home / Self Care | Attending: Urology

## 2023-03-06 ENCOUNTER — Ambulatory Visit: Payer: MEDICAID | Admitting: Urgent Care

## 2023-03-06 ENCOUNTER — Other Ambulatory Visit: Payer: Self-pay

## 2023-03-06 ENCOUNTER — Ambulatory Visit
Admission: RE | Admit: 2023-03-06 | Discharge: 2023-03-06 | Disposition: A | Discharge status: Home / Self Care | Payer: MEDICAID | Attending: Urology | Admitting: Urology

## 2023-03-06 ENCOUNTER — Ambulatory Visit: Payer: MEDICAID

## 2023-03-06 ENCOUNTER — Encounter: Payer: Self-pay | Admitting: Urology

## 2023-03-06 DIAGNOSIS — Z7984 Long term (current) use of oral hypoglycemic drugs: Secondary | ICD-10-CM | POA: Insufficient documentation

## 2023-03-06 DIAGNOSIS — I10 Essential (primary) hypertension: Secondary | ICD-10-CM | POA: Diagnosis not present

## 2023-03-06 DIAGNOSIS — N3941 Urge incontinence: Secondary | ICD-10-CM | POA: Insufficient documentation

## 2023-03-06 DIAGNOSIS — Z7985 Long-term (current) use of injectable non-insulin antidiabetic drugs: Secondary | ICD-10-CM | POA: Insufficient documentation

## 2023-03-06 DIAGNOSIS — Z923 Personal history of irradiation: Secondary | ICD-10-CM | POA: Insufficient documentation

## 2023-03-06 DIAGNOSIS — Z9079 Acquired absence of other genital organ(s): Secondary | ICD-10-CM | POA: Diagnosis not present

## 2023-03-06 DIAGNOSIS — Z9221 Personal history of antineoplastic chemotherapy: Secondary | ICD-10-CM | POA: Insufficient documentation

## 2023-03-06 DIAGNOSIS — Z794 Long term (current) use of insulin: Secondary | ICD-10-CM | POA: Insufficient documentation

## 2023-03-06 DIAGNOSIS — E119 Type 2 diabetes mellitus without complications: Secondary | ICD-10-CM | POA: Diagnosis not present

## 2023-03-06 DIAGNOSIS — Z01818 Encounter for other preprocedural examination: Secondary | ICD-10-CM

## 2023-03-06 DIAGNOSIS — Z8546 Personal history of malignant neoplasm of prostate: Secondary | ICD-10-CM | POA: Insufficient documentation

## 2023-03-06 HISTORY — PX: INTERSTIM IMPLANT PLACEMENT: SHX5130

## 2023-03-06 LAB — GLUCOSE, CAPILLARY
Glucose-Capillary: 105 mg/dL — ABNORMAL HIGH (ref 70–99)
Glucose-Capillary: 65 mg/dL — ABNORMAL LOW (ref 70–99)
Glucose-Capillary: 52 mg/dL — ABNORMAL LOW (ref 70–99)
Glucose-Capillary: 102 mg/dL — ABNORMAL HIGH (ref 70–99)

## 2023-03-06 SURGERY — INSERTION, SACRAL NERVE STIMULATOR, INTERSTIM, STAGE 1
Anesthesia: Monitor Anesthesia Care

## 2023-03-06 MED ORDER — MIDAZOLAM HCL 5 MG/5ML IJ SOLN
INTRAMUSCULAR | Status: DC | PRN
  Administered 2023-03-06 (×2): 1 mg via INTRAVENOUS

## 2023-03-06 MED ORDER — CHLORHEXIDINE GLUCONATE 0.12 % MT SOLN
15.0000 mL | Freq: Once | OROMUCOSAL | Status: AC
  Administered 2023-03-06: 15 mL via OROMUCOSAL

## 2023-03-06 MED ORDER — DEXTROSE 50 % IV SOLN
INTRAVENOUS | Status: AC
  Filled 2023-03-06: qty 50

## 2023-03-06 MED ORDER — TRANEXAMIC ACID-NACL 1000-0.7 MG/100ML-% IV SOLN
INTRAVENOUS | Status: AC
  Filled 2023-03-06: qty 100

## 2023-03-06 MED ORDER — DEXTROSE 50 % IV SOLN
25.0000 mL | Freq: Once | INTRAVENOUS | Status: AC
  Administered 2023-03-06: 25 mL via INTRAVENOUS

## 2023-03-06 MED ORDER — MIDAZOLAM HCL 2 MG/2ML IJ SOLN
INTRAMUSCULAR | Status: AC
Start: 2023-03-06 — End: ?
  Filled 2023-03-06: qty 2

## 2023-03-06 MED ORDER — CHLORHEXIDINE GLUCONATE 0.12 % MT SOLN
OROMUCOSAL | Status: AC
  Filled 2023-03-06: qty 15

## 2023-03-06 MED ORDER — DEXMEDETOMIDINE HCL IN NACL 80 MCG/20ML IV SOLN
INTRAVENOUS | Status: DC | PRN
  Administered 2023-03-06: 4 ug via INTRAVENOUS

## 2023-03-06 MED ORDER — ORAL CARE MOUTH RINSE: 15.0000 mL | Freq: Once | OROMUCOSAL | Status: AC

## 2023-03-06 MED ORDER — BUPIVACAINE HCL (PF) 0.5 % IJ SOLN
INTRAMUSCULAR | Status: AC
  Filled 2023-03-06: qty 30

## 2023-03-06 MED ORDER — BUPIVACAINE HCL 0.5 % IJ SOLN
INTRAMUSCULAR | Status: DC | PRN
  Administered 2023-03-06: 10 mL

## 2023-03-06 MED ORDER — LIDOCAINE-EPINEPHRINE (PF) 1 %-1:200000 IJ SOLN
INTRAMUSCULAR | Status: AC
  Filled 2023-03-06: qty 30

## 2023-03-06 MED ORDER — PROPOFOL 1000 MG/100ML IV EMUL
INTRAVENOUS | Status: AC
  Filled 2023-03-06: qty 100

## 2023-03-06 MED ORDER — SODIUM CHLORIDE 0.9 % IV SOLN: INTRAVENOUS | Status: DC

## 2023-03-06 MED ORDER — LIDOCAINE-EPINEPHRINE (PF) 1 %-1:200000 IJ SOLN
INTRAMUSCULAR | Status: DC | PRN
  Administered 2023-03-06: 10 mL

## 2023-03-06 SURGICAL SUPPLY — 43 items
BENZOIN TINCTURE PRP APPL 2/3 (GAUZE/BANDAGES/DRESSINGS) IMPLANT
BLADE SURG 15 STRL LF DISP TIS (BLADE) ×1 IMPLANT
CHLORAPREP W/TINT 26 (MISCELLANEOUS) ×1 IMPLANT
COVER MAYO STAND STRL (DRAPES) ×1 IMPLANT
COVER PROBE FLX POLY STRL (MISCELLANEOUS) ×1 IMPLANT
DERMABOND ADVANCED .7 DNX12 (GAUZE/BANDAGES/DRESSINGS) ×1 IMPLANT
DRAPE C-ARM XRAY 36X54 (DRAPES) ×1 IMPLANT
DRAPE C-ARMOR (DRAPES) ×1 IMPLANT
DRAPE INCISE 23X17 STRL (DRAPES) ×1 IMPLANT
DRAPE INCISE IOBAN 23X17 STRL (DRAPES) IMPLANT
DRAPE INCISE IOBAN 66X45 STRL (DRAPES) ×1 IMPLANT
DRAPE LAPAROTOMY TRNSV 106X77 (MISCELLANEOUS) ×1 IMPLANT
DRAPE SHEET LG 3/4 BI-LAMINATE (DRAPES) ×1 IMPLANT
DRSG TEGADERM 2-3/8X2-3/4 SM (GAUZE/BANDAGES/DRESSINGS) ×1 IMPLANT
DRSG TEGADERM 4X4.75 (GAUZE/BANDAGES/DRESSINGS) ×1 IMPLANT
DRSG TELFA 3X8 NADH STRL (GAUZE/BANDAGES/DRESSINGS) ×1 IMPLANT
ELECT REM PT RETURN 9FT ADLT (ELECTROSURGICAL)
ELECTRODE REM PT RTRN 9FT ADLT (ELECTROSURGICAL) ×1 IMPLANT
GAUZE 4X4 16PLY ~~LOC~~+RFID DBL (SPONGE) ×1 IMPLANT
GAUZE SPONGE 4X4 12PLY STRL (GAUZE/BANDAGES/DRESSINGS) IMPLANT
GLOVE BIO SURGEON STRL SZ7.5 (GLOVE) ×1 IMPLANT
GOWN STRL REUS W/ TWL XL LVL3 (GOWN DISPOSABLE) ×1 IMPLANT
KIT HANDSET INTERSTIM COMM (NEUROSURGERY SUPPLIES) IMPLANT
KIT TEST INTERSTIM LEAD (MISCELLANEOUS) IMPLANT
KIT TURNOVER CYSTO (KITS) ×1 IMPLANT
LEAD NEUROSTIMULATOR INTERSTIM (Lead) IMPLANT
MANIFOLD NEPTUNE II (INSTRUMENTS) ×1 IMPLANT
NDL HYPO 22X1.5 SAFETY MO (MISCELLANEOUS) ×1 IMPLANT
NEEDLE HYPO 22X1.5 SAFETY MO (MISCELLANEOUS) ×1 IMPLANT
NEUROSTIMULATOR 1.7X2X.06 (UROLOGICAL SUPPLIES) IMPLANT
PACK BASIN MINOR ARMC (MISCELLANEOUS) ×1 IMPLANT
STRIP CLOSURE SKIN 1/2X4 (GAUZE/BANDAGES/DRESSINGS) ×1 IMPLANT
SUCTION TUBE FRAZIER 10FR DISP (SUCTIONS) ×1 IMPLANT
SUT SILK 2-0 18XBRD TIE 12 (SUTURE) IMPLANT
SUT VIC AB 3-0 SH 27X BRD (SUTURE) ×2 IMPLANT
SUT VIC AB 4-0 PS2 18 (SUTURE) ×1 IMPLANT
SUT VIC AB 4-0 RB1 27X BRD (SUTURE) ×1 IMPLANT
SYR 10ML LL (SYRINGE) ×2 IMPLANT
SYR BULB IRRIG 60ML STRL (SYRINGE) ×1 IMPLANT
TOWEL OR 17X26 4PK STRL BLUE (TOWEL DISPOSABLE) ×1 IMPLANT
TRAP FLUID SMOKE EVACUATOR (MISCELLANEOUS) ×1 IMPLANT
WATER STERILE IRR 1000ML POUR (IV SOLUTION) ×1 IMPLANT
WATER STERILE IRR 500ML POUR (IV SOLUTION) ×1 IMPLANT

## 2023-03-06 NOTE — Transfer of Care (Signed)
Immediate Anesthesia Transfer of Care Note  Patient: Jonathan Austin  Procedure(s) Performed: Percutaneous Temmporary Placement for Peripheral Nerve Evaluation  Patient Location: PACU  Anesthesia Type:MAC  Level of Consciousness: awake  Airway & Oxygen Therapy: Patient Spontanous Breathing  Post-op Assessment: Report given to RN and Post -op Vital signs reviewed and stable  Post vital signs: stable  Last Vitals:  Vitals Value Taken Time  BP    Temp    Pulse    Resp    SpO2      Last Pain:  Vitals:   03/06/23 1152  TempSrc: Temporal  PainSc: 0-No pain         Complications: No notable events documented.

## 2023-03-06 NOTE — Anesthesia Postprocedure Evaluation (Signed)
Anesthesia Post Note  Patient: [name]  Procedure(s) Performed: Percutaneous Temmporary Placement for Peripheral Nerve Evaluation  Patient location during evaluation: PACU Anesthesia Type: MAC Level of consciousness: awake and alert Pain management: pain level controlled Vital Signs Assessment: post-procedure vital signs reviewed and stable Respiratory status: spontaneous breathing, nonlabored ventilation, respiratory function stable and patient connected to nasal cannula oxygen Cardiovascular status: blood pressure returned to baseline and stable Postop Assessment: no apparent nausea or vomiting Anesthetic complications: no   There were no known notable events for this encounter.   Last Vitals:  Vitals:   03/06/23 1326 03/06/23 1330  BP: 123/85 139/86  Pulse: 95 95  Resp: 12 18  Temp: 37.1 C   SpO2: 98% 98%    Last Pain:  Vitals:   03/06/23 1326  TempSrc:   PainSc: 0-No pain                 Louie Boston

## 2023-03-06 NOTE — Progress Notes (Signed)
CV and Resp exam normal Questions answered

## 2023-03-06 NOTE — H&P (Signed)
ST: Carcinoma of the prostate with PSA of 317.  Transurethral resection of prostate April 2021 by Dr. Liliane Shi.  Has had a radiation.  Recent PSA 0.1.  Has failed Vesicare and Myrbetriq for mixed incontinence.  In May 2023 had a urethral stricture dilated.  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  He had radiation changes in his bladder.   A few years ago he was seeing Dr. Laverle Patter.  He had positive lymph nodes.  It was recommended to have transurethral resection of the prostate prior to planned radiation for his significant lower urinary tract symptoms.  It was felt that he had locally advanced disease causing some of the symptoms.  According to Dr. Liliane Shi he had bothersome urgency frequency and a poor flow in spite of Flomax prior to surgery or he had a channel TURP and circumcision.  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  Prior to dilation he had worsening lower urinary tract symptoms including urgency and incontinence.  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  I think he has some rigidity in the prostatic urethra.  He had radiation changes.   Currently leaks a small amount with coughing sneezing bending lifting.  He leaks more when he is active.  Primary problem is urge incontinence.  He has moderately severe bedwetting.  He can soak 3-4 pads a day.  He said he was continent after the transurethral resection of the prostate and only started to leak a few months prior to the urethral dilation.  I do not see any urodynamics done prior to the transurethral section of the prostate.  I did not see bladder capacity measured under anesthesia  He voids every 1 hour.  No nocturia.  Flow was good.  Right-handed and has had a hernia repair.  He is an insulin-dependent diabetic.  He has failed Vesicare and Myrbetriq.  He had shorter penis in a standing position  I think he did have a positive cough test.  Male genitalia otherwise normal     Patient has mixed incontinence.  He has bedwetting.  I'm suspect that he has a small spastic bladder.  He likely may also have urethral insufficiency which is less common postradiation and transurethral section of the prostate.  He has had a stricture which would make it sphincter more difficult to place in the bulbar urethra.  He may need repeat cystoscopy in the future.  Call if culture positive.  Order urodynamics and proceed accordingly.  He may need to be supine his bladder neck occluded with a small catheter to measure his bladder capacity if he starts leaking a lot at low volumes during urodynamics    When the patient saw the nurse practitioner recently he apparently had no insurance and therefore could not undergo urodynamics and cystoscopy. Patient still has high-volume mixed incontinence especially when his sugars are out of control.  He is on insulin.  He has sleep apnea.  He now has Medicaid and would like to organize the urodynamics.  Clinically not infected.   I urged him to follow-up with the cancer center in [facility] for his ongoing prostate cancer surveillance and treatment    Follow-up with urodynamics and proceed accordingly.  Currently his flow was good.  I am suspect that he has a small capacity overactive bladder in combination with urethral insufficiency    I last saw  the patient in February 2024 and I do not think he had urodynamics for insurance reasons so had them at [facility].  In May he was seen by our nurse practitioner.  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  Referred to cancer center for further evaluation.   It appears the patient went to the emergency room September 16, 2022 difficulty voiding.  He was having retention symptoms.  He was on immunotherapy.  Fully catheter was placed for 450 mL. CT scan demonstrated normal  kidneys with no hydronephrosis.  Turns out he had a pos culture that was treated and he will finish antibiotics tomorrow   Patient had urodynamics.  He voided 258 mL with a maximal flow of 33 mL/s and his residual is 3 mL.  Maximum bladder capacity was 256 mL.  He had increased bladder sensation.  Bladder was stable.  At lower volumes his leak point pressure was 102 cm water.  At higher volumes range between 140 and 163 cm of water.  During voiding he did well sustain detrusor contraction at 54 cm of water and acute backs at 9.5 mL/s.  EMG activity normal.     Patient has mixed incontinence and a complicated presentation with no quick treatment path.  He understands that approximately 70% of his problem is a small capacity overactive bladder resulting in urge incontinence and bedwetting.  He has mild urethral insufficiency causing the stress incontinence.  Placing artificial sphincter may not help the bedwetting or overactive bladder component, he recently had a urinary tract infection with retention, and a bulbar urethral stricture that would make placement of the sphincter more challenging or complicating in the future.   he saw the medical oncologist and started new immunotherapy in the last 4 to 6 weeks in [place].  I want him to have a trial of voiding tomorrow and another urine could be sent for culture.  I would like to have him see Dr. Lonna Cobb to make certain that his prostate cancer is appropriately managed.  He might benefit from cystoscopy but the catheter went in still likely if he has a stricture it is mild.  We want to make certain that his residuals remain low.  My next step would be to try [facility] but this could complicate retention risks.  Third line overactive bladder treatment is something to consider and accept the stress incontinence.   Gets more challenging for the patient to understand the complexity of his incontinence issue and we will continue to explain it to him moving forward.  Patient understand the stepwise approach   Patient had a successful trial of voiding October 04, 2022.  Still wearing 4-5 pads a day.  High-volume leakage.  High-volume bedwetting.  Cancer checkup was cancer free.  Flow was good.   He is channel TURP was in 2021 in [place] by radiation.  In May 2023 Dr. 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  I read the operative note.  It was dilated from 10-24 [place] with dilators.  Distance was felt through the region of the prostatic urethra.  Once dilated the operative note suggest that he may have had stricture in the prostatic urethra and/or bladder neck.  He had erythema consistent with radiation changes of the bladder mucosa.    Reassess patient in 6 weeks on Gemtesa.  Complexity discussed with patient again.  I am highly suspect that stricture was in the level  of the prostatic urethra bladder neck and or bulbar urethra.  If this recurs proximal to the sphincter it would be troublesome.  Repeat cystoscopy would need to be performed.  The retention with Gemtesa discussed.  It has been over a year since he was dilated.   I am a bit concerned that he hit he will still have incontinence with a third line OAB therapy but is likely a good step to consider    Today The patient had a positive urine culture in August 2024 and his culture was again positive in October 2024 during the last visit.  Both were Klebsiella.  CT scan in April 2024 normal with no evidence of metastatic disease.  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  He again said his incontinence started all of a sudden while he was working before the dilation of stricture and dilation did not affect.  Clinically not infected today and he understands he does not have an obvious etiology.

## 2023-03-06 NOTE — Interval H&P Note (Signed)
History and Physical Interval Note:  03/06/2023 12:34 PM  [name]  has presented today for surgery, with the diagnosis of Refractory Urge Incontinence.  The various methods of treatment have been discussed with the patient and family. After consideration of risks, benefits and other options for treatment, the patient has consented to  Procedure(s): Percutaneous Temmporary Placement for Peripheral Nerve Evaluation (N/A) as a surgical intervention.  The patient's history has been reviewed, patient examined, no change in status, stable for surgery.  I have reviewed the patient's chart and labs.  Questions were answered to the patient's satisfaction.     Yulissa Needham A [name]

## 2023-03-06 NOTE — Discharge Instructions (Addendum)
I have reviewed discharge instructions in detail with the patient. They will follow-up with me or their physician as scheduled. My nurse will also be calling the patients as per protocol.

## 2023-03-06 NOTE — Anesthesia Preprocedure Evaluation (Signed)
Anesthesia Evaluation  Patient identified by MRN, date of birth, ID band Patient awake    Reviewed: Allergy & Precautions, NPO status , Patient's Chart, lab work & pertinent test results  History of Anesthesia Complications Negative for: history of anesthetic complications  Airway Mallampati: III  TM Distance: >3 FB Neck ROM: full    Dental  (+) Poor Dentition   Pulmonary sleep apnea and Continuous Positive Airway Pressure Ventilation    Pulmonary exam normal        Cardiovascular hypertension, On Medications negative cardio ROS Normal cardiovascular exam     Neuro/Psych  PSYCHIATRIC DISORDERS  Depression     Neuromuscular disease    GI/Hepatic Neg liver ROS,GERD  Medicated,,  Endo/Other  negative endocrine ROSdiabetes, Type 2, Oral Hypoglycemic Agents    Renal/GU Renal disease  negative genitourinary   Musculoskeletal   Abdominal   Peds  Hematology negative hematology ROS (+)   Anesthesia Other Findings Past Medical History: No date: Alcohol use disorder, severe, dependence (HCC)     Comment:  05-24-2019 recent [facility] admission for suicide ideation and              alcohol / cocaine detox 05-04-2019 in epic,  pt stated               has been taking naltrexone daily as prescribed since and               no alcohol since No date: Cocaine abuse (HCC)     Comment:  05-24-2019  pt had recent [facility] admission, pt stated last               used  "crack" 05-03-2019 No date: Cocaine abuse with cocaine-induced mood disorder (HCC) No date: Depression No date: GERD (gastroesophageal reflux disease) No date: History of suicidal ideation     Comment:  multiple [facility] admission's;  last one admitted 05-04-2019 No date: Hyperlipidemia No date: Hyperplasia of prostate with lower urinary tract symptoms  (LUTS) No date: Hypertension     Comment:  followed by pcp No date: Mixed stress and urge urinary incontinence No date: OSA on  CPAP     Comment:  uses intermittently No date: Phimosis urologist-- dr winter: Prostate cancer [facility])     Comment:  dx 02/ 2021;  localized advanced, gleason 4+4, PSA 317 No date: Renal insufficiency No date: Sinus tachycardia No date: Thoracic aortic atherosclerosis (HCC) No date: Type 2 diabetes mellitus treated with insulin (HCC)     Comment:  followed by pcp   (05-24-2019  per stated checks blood               sugar twice daily, fasting sugar-- 180-200) No date: Wears glasses  Past Surgical History: 05/29/2019: CIRCUMCISION; N/A     Comment:  Procedure: CIRCUMCISION ADULT;  Surgeon: Rene Paci, MD;  Location: [facility];  Service: Urology;  Laterality: N/A; 06/08/2021: CYSTOSCOPY WITH URETHRAL DILATATION; N/A     Comment:  Procedure: CYSTOSCOPY WITH URETHRAL DILATATION;                Surgeon: Riki Altes, MD;  Location: [facility] ORS;                Service: Urology;  Laterality: N/A; No date: PROSTATE BIOPSY child: TONSILLECTOMY 05/29/2019: TRANSURETHRAL RESECTION OF PROSTATE;  N/A     Comment:  Procedure: TRANSURETHRAL RESECTION OF THE PROSTATE               (TURP)/ CYSTOSCOPY;  Surgeon: Rene Paci,               MD;  Location: [facility];  Service:               Urology;  Laterality: N/A;  BMI    Body Mass Index: 36.81 kg/m      Reproductive/Obstetrics negative OB ROS                             Anesthesia Physical Anesthesia Plan  ASA: 3  Anesthesia Plan: General   Post-op Pain Management: Minimal or no pain anticipated   Induction: Intravenous  PONV Risk Score and Plan: 2 and Propofol infusion and TIVA  Airway Management Planned: Natural Airway and Nasal Cannula  Additional Equipment:   Intra-op Plan:   Post-operative Plan:   Informed Consent: I have reviewed the patients History and Physical, chart, labs and discussed the procedure  including the risks, benefits and alternatives for the proposed anesthesia with the patient or authorized representative who has indicated his/her understanding and acceptance.     Dental Advisory Given  Plan Discussed with: Anesthesiologist, CRNA and Surgeon  Anesthesia Plan Comments: (Patient consented for risks of anesthesia including but not limited to:  - adverse reactions to medications - risk of airway placement if required - damage to eyes, teeth, lips or other oral mucosa - nerve damage due to positioning  - sore throat or hoarseness - Damage to heart, brain, nerves, lungs, other parts of body or loss of life  Patient voiced understanding and assent.)       Anesthesia Quick Evaluation

## 2023-03-06 NOTE — H&P (View-Only) (Signed)
CV and Resp exam normal Questions answered

## 2023-03-06 NOTE — Interval H&P Note (Signed)
History and Physical Interval Note:  03/06/2023 12:34 PM  [name]  has presented today for surgery, with the diagnosis of Refractory Urge Incontinence.  The various methods of treatment have been discussed with the patient and family. After consideration of risks, benefits and other options for treatment, the patient has consented to  Procedure(s): Percutaneous Temmporary Placement for Peripheral Nerve Evaluation (N/A) as a surgical intervention.  The patient's history has been reviewed, patient examined, no change in status, stable for surgery.  I have reviewed the patient's chart and labs.  Questions were answered to the patient's satisfaction.     Jonathan Austin A [name]

## 2023-03-06 NOTE — Op Note (Signed)
Preoperative diagnosis: Refractory urgency incontinence Postoperative diagnosis: Refractory urgency incontinence Surgery: Peripheral nerve evaluation and fluoroscopy Surgeon: Dr. Lorin Picket Vaniyah Lansky  The patient has the above diagnoses and consented the above procedure.  He was placed in the prone position.  Preoperative antibiotics given.  Fluoroscopy utilized.  It was easy to mark the S3 foramina bilaterally.  I used 1 cc of lidocaine epinephrine mixture on the right and 10 cc on the left.  When I first placed a foramen needle on the right it went directly into the S3 foramina.  When I stimulated 3.5 inch foramen needle on the right he had toe response.  He had little bellow response.  Radiographically it was in perfect position.  The same exercise was done on the left side with the same responses.  I removed the inner aspect of the foramen needle and placed the lead to the appropriate depth removing the foramen needle.  Sterile dressing was applied.  He will be followed as per protocol

## 2023-03-07 ENCOUNTER — Telehealth: Payer: Self-pay

## 2023-03-07 NOTE — Telephone Encounter (Signed)
2nd attempt to reach pt, LVM for pt to return call

## 2023-03-07 NOTE — Telephone Encounter (Signed)
Called pt to do a post op call and schedule a 1 week follow up with Dr.Macdiarmid, okay to double book anywhere.   LVM for pt to return call.

## 2023-03-09 ENCOUNTER — Telehealth: Payer: Self-pay

## 2023-03-09 ENCOUNTER — Encounter: Payer: Self-pay | Admitting: Urology

## 2023-03-09 NOTE — Telephone Encounter (Signed)
Recent surgery: Percutaneous Temmporary Placement for Peripheral Nerve Evaluation  When: 03/06/2023 Signs and symptoms: Fever,chills:no Nausea,vomiting:no nausea or vomiting  Voiding: pt states no issues urinating Frequency:persistent Urgency: no Incontinence: no,  Incomplete Emptying: yes Plan:  Appointment w/Physician: 03/13/2023

## 2023-03-13 ENCOUNTER — Ambulatory Visit: Payer: MEDICAID | Admitting: Urology

## 2023-03-13 ENCOUNTER — Encounter: Payer: Self-pay | Admitting: Urology

## 2023-03-13 VITALS — BP 152/91 | HR 123 | Ht 67.0 in | Wt 236.4 lb

## 2023-03-13 DIAGNOSIS — N3946 Mixed incontinence: Secondary | ICD-10-CM

## 2023-03-13 LAB — URINALYSIS, COMPLETE
Bilirubin, UA: NEGATIVE
Glucose, UA: NEGATIVE
Ketones, UA: NEGATIVE
Nitrite, UA: NEGATIVE
Specific Gravity, UA: 1.025 (ref 1.005–1.030)
Urobilinogen, Ur: 1 mg/dL (ref 0.2–1.0)
pH, UA: 6 (ref 5.0–7.5)

## 2023-03-13 LAB — MICROSCOPIC EXAMINATION: WBC, UA: >30 /[HPF] — AB (ref 0–5)

## 2023-03-13 NOTE — Progress Notes (Signed)
03/13/2023 8:38 AM   Jonathan Austin 06-Jan-1966 [id number]  Referring provider: Irven Coe, MD [address] [place],  [place] [address]  No chief complaint on file.   HPI: Patient has complicated voiding dysfunction.  History has been reviewed.  He had peripheral nerve evaluation the end of January 2025.  He has mixed dressers incontinence.  Primary problem is urge incontinence and moderately severe bedwetting.  He can soak 3-4 pads a day.  He has had a transurethral section of the prostate.  He had radiation.  He has mild urethral insufficiency.  He has had a bulbar urethral stricture dilated.  He has advanced prostate cancer and is followed by medical oncology.  He failed multiple medications.  The patient kept a voiding diary where his number of leaks decreased from 20-4 and 20-3.5.  His nighttime frequency went from 4-1.  He has had a dramatic decrease in urgency.  Official diary scanned into medical record  Patient is more than 80% better.  He did not get up at all last night.  He normally gets up once.  He has no more bedwetting.  He is wearing 2 pads a day that are damp versus 4 that are soaked.  Leads were in good position and easily removed.  No skin issues.  PMH: Past Medical History:  Diagnosis Date   Alcohol use disorder, severe, dependence (HCC)    05-24-2019 recent [facility] admission for suicide ideation and alcohol / cocaine detox 05-04-2019 in epic,  pt stated has been taking naltrexone daily as prescribed since and no alcohol since   Cocaine abuse (HCC)    05-24-2019  pt had recent [facility] admission, pt stated last used  "crack" 05-03-2019   Cocaine abuse with cocaine-induced mood disorder (HCC)    Depression    GERD (gastroesophageal reflux disease)    History of suicidal ideation    multiple [facility] admission's;  last one admitted 05-04-2019   Hyperlipidemia    Hyperplasia of prostate with lower urinary tract symptoms (LUTS)    Hypertension    followed by  pcp   Mixed stress and urge urinary incontinence    OSA on CPAP    uses intermittently   Phimosis    Prostate cancer [facility]) urologist-- dr winter   dx 02/ 2021;  localized advanced, gleason 4+4, PSA 317   Renal insufficiency    Sinus tachycardia    Thoracic aortic atherosclerosis (HCC)    Type 2 diabetes mellitus treated with insulin (HCC)    followed by pcp   (05-24-2019  per stated checks blood sugar twice daily, fasting sugar-- 180-200)   Wears glasses     Surgical History: Past Surgical History:  Procedure Laterality Date   CIRCUMCISION N/A 05/29/2019   Procedure: CIRCUMCISION ADULT;  Surgeon: Rene Paci, MD;  Location: [facility];  Service: Urology;  Laterality: N/A;   CYSTOSCOPY WITH URETHRAL DILATATION N/A 06/08/2021   Procedure: CYSTOSCOPY WITH URETHRAL DILATATION;  Surgeon: Riki Altes, MD;  Location: [facility];  Service: Urology;  Laterality: N/A;   INTERSTIM IMPLANT PLACEMENT N/A 03/06/2023   Procedure: Percutaneous Temmporary Placement for Peripheral Nerve Evaluation;  Surgeon: Alfredo Martinez, MD;  Location: [facility];  Service: Urology;  Laterality: N/A;   PROSTATE BIOPSY     TONSILLECTOMY  child   TRANSURETHRAL RESECTION OF PROSTATE N/A 05/29/2019   Procedure: TRANSURETHRAL RESECTION OF THE PROSTATE (TURP)/ CYSTOSCOPY;  Surgeon: Rene Paci, MD;  Location: [facility];  Service: Urology;  Laterality: N/A;    Home Medications:  Allergies as of 03/13/2023   No Known Allergies      Medication List        Accurate as of March 13, 2023  8:38 AM. If you have any questions, ask your nurse or doctor.          abiraterone acetate 250 MG tablet Commonly known as: ZYTIGA Take 4 tablets (1,000 mg total) by mouth daily. Take on an empty stomach 1 hour before or 2 hours after a meal   Accu-Chek Guide test strip Generic drug: glucose blood USE UP TO 4 TIMES A DAY AS DIRECTED   Accu-Chek Softclix Lancets  lancets SMARTSIG:Topical 1-4 Times Daily   amLODipine 5 MG tablet Commonly known as: NORVASC Take 1 tablet (5 mg total) by mouth daily.   blood glucose meter kit and supplies Dispense based on patient and insurance preference. Use up to four times daily as directed. (FOR ICD-10 E10.9, E11.9).   Dexcom G7 Sensor Misc See admin instructions.   HumaLOG Mix 75/25 KwikPen (75-25) 100 UNIT/ML KwikPen Generic drug: Insulin Lispro Prot & Lispro Inject into the skin in the morning and at bedtime.   Insulin Pen Needle 31G X 5 MM Misc 1 Needle by Does not apply route 2 (two) times daily.   NovoLOG Mix 70/30 FlexPen (70-30) 100 UNIT/ML FlexPen Generic drug: insulin aspart protamine - aspart Inject 20 Units into the skin 2 (two) times daily. What changed:  when to take this reasons to take this   Ozempic (0.25 or 0.5 MG/DOSE) 2 MG/3ML Sopn Generic drug: Semaglutide(0.25 or 0.5MG /DOS) Inject 0.25 mg as directed once a week. Sunday   predniSONE 5 MG tablet Commonly known as: DELTASONE Take 1 tablet (5 mg total) by mouth daily with breakfast.   rosuvastatin 20 MG tablet Commonly known as: CRESTOR Take 20 mg by mouth daily.        Allergies: No Known Allergies  Family History: Family History  Problem Relation Age of Onset   Hypertension Mother    Hypertension Father    Diabetes Father    Prostate cancer Maternal Grandfather 8   Breast cancer Neg Hx    Colon cancer Neg Hx    Pancreatic cancer Neg Hx     Social History:  reports that he has never smoked. He has never been exposed to tobacco smoke. He has never used smokeless tobacco. He reports that he does not currently use alcohol after a past usage of about 2.0 standard drinks of alcohol per week. He reports that he does not currently use drugs after having used the following drugs: Cocaine and Marijuana.  ROS:                                        Physical Exam: There were no vitals taken for  this visit.  Constitutional:  Alert and oriented, No acute distress. HEENT: [place] AT, moist mucus membranes.  Trachea midline, no masses.  Laboratory Data: Lab Results  Component Value Date   WBC 4.5 01/23/2023   HGB 12.3 (L) 01/23/2023   HCT 36.7 (L) 01/23/2023   MCV 89.1 01/23/2023   PLT 326 01/23/2023    Lab Results  Component Value Date   CREATININE 0.86 01/23/2023    No results found for: "PSA"  Lab Results  Component Value Date   TESTOSTERONE <3 (L) 01/23/2023  Lab Results  Component Value Date   HGBA1C 11.8 (H) 04/11/2022    Urinalysis    Component Value Date/Time   COLORURINE YELLOW 09/16/2022 1702   APPEARANCEUR Clear 01/02/2023 1456   LABSPEC 1.025 09/16/2022 1702   PHURINE 6.0 09/16/2022 1702   GLUCOSEU Negative 01/02/2023 1456   HGBUR SMALL (A) 09/16/2022 1702   BILIRUBINUR Negative 01/02/2023 1456   KETONESUR NEGATIVE 09/16/2022 1702   PROTEINUR 1+ (A) 01/02/2023 1456   PROTEINUR NEGATIVE 09/16/2022 1702   UROBILINOGEN 0.2 12/17/2014 1649   NITRITE Negative 01/02/2023 1456   NITRITE NEGATIVE 09/16/2022 1702   LEUKOCYTESUR Trace (A) 01/02/2023 1456   LEUKOCYTESUR TRACE (A) 09/16/2022 1702    Pertinent Imaging:   Assessment & Plan: I am very pleased of his dramatic positive response.  I will proceed with scheduling him for InterStim.  There are no diagnoses linked to this encounter.  No follow-ups on file.  Martina Sinner, MD  [facility] [place] [facility] [address], [address] [place], [place] [address] [phone]   03/13/2023 8:38 AM   Jonathan Austin Feb 19, 1965 [id number]  Referring provider: Irven Coe, MD [address] [place],  [place] [address]  No chief complaint on file.

## 2023-03-14 ENCOUNTER — Other Ambulatory Visit (HOSPITAL_COMMUNITY): Payer: Self-pay | Admitting: Pharmacy Technician

## 2023-03-14 ENCOUNTER — Other Ambulatory Visit: Payer: Self-pay

## 2023-03-14 ENCOUNTER — Other Ambulatory Visit: Payer: Self-pay | Admitting: Hematology and Oncology

## 2023-03-14 ENCOUNTER — Other Ambulatory Visit (HOSPITAL_COMMUNITY): Payer: Self-pay

## 2023-03-14 MED ORDER — ABIRATERONE ACETATE 250 MG PO TABS
1000.0000 mg | ORAL_TABLET | Freq: Every day | ORAL | 0 refills | Status: DC
  Filled 2023-03-14: qty 120, 30d supply, fill #0
  Filled ????-??-??: fill #0

## 2023-03-14 NOTE — Progress Notes (Signed)
 Specialty Pharmacy Refill Coordination Note  Jonathan Austin is a 58 y.o. male contacted today regarding refills of specialty medication(s) Abiraterone  Acetate (ZYTIGA )   Patient requested Delivery   Delivery date: 03/23/23   Verified address: Patient address [address]   [place]   Medication will be filled on 03/22/23.  RR sent for Abiraterone 

## 2023-03-22 ENCOUNTER — Other Ambulatory Visit: Payer: Self-pay

## 2023-03-27 ENCOUNTER — Other Ambulatory Visit: Payer: Self-pay

## 2023-03-27 ENCOUNTER — Telehealth: Payer: Self-pay

## 2023-03-27 DIAGNOSIS — N3946 Mixed incontinence: Secondary | ICD-10-CM

## 2023-03-27 NOTE — Telephone Encounter (Signed)
  Per Dr. Sherron Monday, MD, Patient is to be scheduled for Stage One and Two Interstim Placement with Impedance Check   Mr. [place]ville was contacted and possible surgical dates were discussed, Monday March 3rd, 2025 was agreed upon for surgery.    Patient was directed to call [phone] between 1-3pm the day before surgery to find out surgical arrival time.  Instructions were given not to eat or drink from midnight on the night before surgery and have a driver for the day of surgery. On the surgery day patient was instructed to enter through the Medical Mall entrance of [facility] report the Same Day Surgery desk.   Pre-Admit Testing will be in contact via phone to set up an interview with the anesthesia team to review your history and medications prior to surgery.   Reminder of this information was sent via Mail to the patient.

## 2023-03-27 NOTE — Progress Notes (Signed)
   [facility] Surgical Posting Form  Surgery Date: Date: 04/10/2023  Surgeon: Dr. Alfredo Martinez, MD  Inpt ( No  )   Outpt (Yes)   Obs ( No  )   Diagnosis: N39.46 Urge Incontinence  -CPT: 96295, 28413, [phone], [phone]  Surgery: Stage One and Two Interstim Placement with Impedance Check  Stop Anticoagulations: Yes  Cardiac/Medical/Pulmonary Clearance needed: no  *Orders entered into EPIC  Date: 03/27/23   *Case booked in [place]  Date: 03/24/2023  *Notified pt of Surgery: Date: 03/24/2023  PRE-OP UA & CX: no  *Placed into Prior Authorization Work Que Date: 03/27/23  Assistant/laser/rep:No

## 2023-04-04 ENCOUNTER — Encounter
Admission: RE | Admit: 2023-04-04 | Discharge: 2023-04-04 | Disposition: A | Discharge status: Home / Self Care | Payer: MEDICAID | Source: Ambulatory Visit | Attending: Urology | Admitting: Urology

## 2023-04-04 ENCOUNTER — Other Ambulatory Visit: Payer: Self-pay

## 2023-04-04 DIAGNOSIS — E1142 Type 2 diabetes mellitus with diabetic polyneuropathy: Secondary | ICD-10-CM

## 2023-04-04 HISTORY — DX: Unspecified eustachian tube disorder, bilateral: H69.93

## 2023-04-04 NOTE — Patient Instructions (Addendum)
 Your procedure is scheduled on: 04/10/23 - Monday Report to the Registration Desk on the 1st floor of the Medical Mall. To find out your arrival time, please call [phone] between 1PM - 3PM on: 04/07/23 - Friday If your arrival time is 6:00 am, do not arrive before that time as the Medical Mall entrance doors do not open until 6:00 am.  REMEMBER: Instructions that are not followed completely may result in serious medical risk, up to and including death; or upon the discretion of your surgeon and anesthesiologist your surgery may need to be rescheduled.  Do not eat food or drink any liquids after midnight the night before surgery.  No gum chewing or hard candies.   One week prior to surgery: Stop Anti-inflammatories (NSAIDS) such as Advil, Aleve, Ibuprofen, Motrin, Naproxen, Naprosyn and Aspirin based products such as Excedrin, Goody's Powder, BC Powder. You may take Tylenol if needed for pain up until the day of surgery.  Stop ANY OVER THE COUNTER supplements until after surgery.  HUMALOG MIX 75/25 - inject 1/2 of your bedtime dose on the night before your surgery.  HOLD HUMALOG MIX 75/25 on the morning of your surgery.  HOLD OZEMPIC 7 days prior to your procedure.  ON THE DAY OF SURGERY ONLY TAKE THESE MEDICATIONS WITH SIPS OF WATER:  abiraterone acetate (ZYTIGA)  amLODipine (NORVASC)  predniSONE (DELTASONE)    No Alcohol for 24 hours before or after surgery.  No Smoking including e-cigarettes for 24 hours before surgery.  No chewable tobacco products for at least 6 hours before surgery.  No nicotine patches on the day of surgery.  Do not use any "recreational" drugs for at least a week (preferably 2 weeks) before your surgery.  Please be advised that the combination of cocaine and anesthesia may have negative outcomes, up to and including death. If you test positive for cocaine, your surgery will be cancelled.  On the morning of surgery brush your teeth with toothpaste  and water, you may rinse your mouth with mouthwash if you wish. Do not swallow any toothpaste or mouthwash.  Do not wear jewelry, make-up, hairpins, clips or nail polish.  For welded (permanent) jewelry: bracelets, anklets, waist bands, etc.  Please have this removed prior to surgery.  If it is not removed, there is a chance that hospital personnel will need to cut it off on the day of surgery.  Do not wear lotions, powders, or perfumes.   Do not shave body hair from the neck down 48 hours before surgery.  Contact lenses, hearing aids and dentures may not be worn into surgery.  Do not bring valuables to the hospital. [facility] is not responsible for any missing/lost belongings or valuables.   Bring your C-PAP to the hospital in case you may have to spend the night.   Notify your doctor if there is any change in your medical condition (cold, fever, infection).  Wear comfortable clothing (specific to your surgery type) to the hospital.  After surgery, you can help prevent lung complications by doing breathing exercises.  Take deep breaths and cough every 1-2 hours. Your doctor may order a device called an Incentive Spirometer to help you take deep breaths. When coughing or sneezing, hold a pillow firmly against your incision with both hands. This is called "splinting." Doing this helps protect your incision. It also decreases belly discomfort.  If you are being admitted to the hospital overnight, leave your suitcase in the car. After surgery it may be  brought to your room.  In case of increased patient census, it may be necessary for you, the patient, to continue your postoperative care in the Same Day Surgery department.  If you are being discharged the day of surgery, you will not be allowed to drive home. You will need a responsible individual to drive you home and stay with you for 24 hours after surgery.   If you are taking public transportation, you will need to have a  responsible individual with you.  Please call the Pre-admissions Testing Dept. at [phone] if you have any questions about these instructions.  Surgery Visitation Policy:  Patients having surgery or a procedure may have two visitors.  Children under the age of 30 must have an adult with them who is not the patient.  Temporary Visitor Restrictions Due to increasing cases of flu, RSV and COVID-19: Children ages 19 and under will not be able to visit patients in [facility] hospitals under most circumstances.  Inpatient Visitation:    Visiting hours are 7 a.m. to 8 p.m. Up to four visitors are allowed at one time in a patient room. The visitors may rotate out with other people during the day.  One visitor age 86 or older may stay with the patient overnight and must be in the room by 8 p.m.

## 2023-04-09 MED ORDER — CHLORHEXIDINE GLUCONATE 0.12 % MT SOLN
15.0000 mL | Freq: Once | OROMUCOSAL | Status: AC
  Administered 2023-04-10: 15 mL via OROMUCOSAL

## 2023-04-09 MED ORDER — DEXTROSE 5 % IV SOLN
5.0000 mg/kg | INTRAVENOUS | Status: AC
  Administered 2023-04-10: 540 mg via INTRAVENOUS
  Filled 2023-04-09: qty 13.5

## 2023-04-09 MED ORDER — SODIUM CHLORIDE 0.9% FLUSH: 3.0000 mL | INTRAVENOUS | Status: DC | PRN

## 2023-04-09 MED ORDER — SODIUM CHLORIDE 0.9% FLUSH
3.0000 mL | Freq: Two times a day (BID) | INTRAVENOUS | Status: DC
Start: 2023-04-09 — End: 2023-04-10

## 2023-04-09 MED ORDER — ORAL CARE MOUTH RINSE: 15.0000 mL | Freq: Once | OROMUCOSAL | Status: AC

## 2023-04-09 MED ORDER — VANCOMYCIN HCL IN DEXTROSE 1-5 GM/200ML-% IV SOLN
1000.0000 mg | INTRAVENOUS | Status: AC
  Administered 2023-04-10: 1000 mg via INTRAVENOUS

## 2023-04-10 ENCOUNTER — Ambulatory Visit: Payer: MEDICAID | Admitting: Certified Registered"

## 2023-04-10 ENCOUNTER — Encounter
Admission: RE | Disposition: A | Discharge status: Home / Self Care | Payer: Self-pay | Source: Home / Self Care | Attending: Urology

## 2023-04-10 ENCOUNTER — Ambulatory Visit
Admission: RE | Admit: 2023-04-10 | Discharge: 2023-04-10 | Disposition: A | Discharge status: Home / Self Care | Payer: MEDICAID | Attending: Urology | Admitting: Urology

## 2023-04-10 ENCOUNTER — Ambulatory Visit: Payer: MEDICAID

## 2023-04-10 ENCOUNTER — Other Ambulatory Visit: Payer: Self-pay

## 2023-04-10 ENCOUNTER — Encounter: Payer: Self-pay | Admitting: Urology

## 2023-04-10 DIAGNOSIS — N3944 Nocturnal enuresis: Secondary | ICD-10-CM | POA: Insufficient documentation

## 2023-04-10 DIAGNOSIS — I10 Essential (primary) hypertension: Secondary | ICD-10-CM | POA: Diagnosis not present

## 2023-04-10 DIAGNOSIS — Z8546 Personal history of malignant neoplasm of prostate: Secondary | ICD-10-CM | POA: Diagnosis not present

## 2023-04-10 DIAGNOSIS — Z794 Long term (current) use of insulin: Secondary | ICD-10-CM | POA: Insufficient documentation

## 2023-04-10 DIAGNOSIS — Z6836 Body mass index (BMI) 36.0-36.9, adult: Secondary | ICD-10-CM | POA: Diagnosis not present

## 2023-04-10 DIAGNOSIS — K219 Gastro-esophageal reflux disease without esophagitis: Secondary | ICD-10-CM | POA: Diagnosis not present

## 2023-04-10 DIAGNOSIS — E66813 Obesity, class 3: Secondary | ICD-10-CM | POA: Insufficient documentation

## 2023-04-10 DIAGNOSIS — E1142 Type 2 diabetes mellitus with diabetic polyneuropathy: Secondary | ICD-10-CM

## 2023-04-10 DIAGNOSIS — N3946 Mixed incontinence: Secondary | ICD-10-CM | POA: Diagnosis present

## 2023-04-10 DIAGNOSIS — E109 Type 1 diabetes mellitus without complications: Secondary | ICD-10-CM | POA: Diagnosis not present

## 2023-04-10 DIAGNOSIS — Z923 Personal history of irradiation: Secondary | ICD-10-CM | POA: Insufficient documentation

## 2023-04-10 DIAGNOSIS — N35912 Unspecified bulbous urethral stricture, male: Secondary | ICD-10-CM | POA: Diagnosis not present

## 2023-04-10 LAB — GLUCOSE, CAPILLARY
Glucose-Capillary: 61 mg/dL — ABNORMAL LOW (ref 70–99)
Glucose-Capillary: 123 mg/dL — ABNORMAL HIGH (ref 70–99)
Glucose-Capillary: 122 mg/dL — ABNORMAL HIGH (ref 70–99)

## 2023-04-10 SURGERY — INSERTION, SACRAL NERVE STIMULATOR, INTERSTIM, STAGE 1
Anesthesia: General | Site: Buttocks

## 2023-04-10 MED ORDER — GLYCOPYRROLATE 0.2 MG/ML IJ SOLN
INTRAMUSCULAR | Status: DC | PRN
  Administered 2023-04-10: .2 mg via INTRAVENOUS

## 2023-04-10 MED ORDER — PROPOFOL 1000 MG/100ML IV EMUL
INTRAVENOUS | Status: AC
  Filled 2023-04-10: qty 200

## 2023-04-10 MED ORDER — STERILE WATER FOR IRRIGATION IR SOLN
Status: DC | PRN
  Administered 2023-04-10: 500 mL

## 2023-04-10 MED ORDER — BUPIVACAINE-EPINEPHRINE (PF) 0.5% -1:200000 IJ SOLN
INTRAMUSCULAR | Status: AC
  Filled 2023-04-10: qty 30

## 2023-04-10 MED ORDER — OXYCODONE HCL 5 MG/5ML PO SOLN: 5.0000 mg | Freq: Once | ORAL | Status: DC | PRN

## 2023-04-10 MED ORDER — CHLORHEXIDINE GLUCONATE 0.12 % MT SOLN
OROMUCOSAL | Status: AC
  Filled 2023-04-10: qty 15

## 2023-04-10 MED ORDER — OXYCODONE-ACETAMINOPHEN 5-325 MG PO TABS: 1.0000 | ORAL_TABLET | Freq: Three times a day (TID) | ORAL | 0 refills | Status: AC | PRN

## 2023-04-10 MED ORDER — EPHEDRINE SULFATE-NACL 50-0.9 MG/10ML-% IV SOSY
PREFILLED_SYRINGE | INTRAVENOUS | Status: DC | PRN
  Administered 2023-04-10: 10 mg via INTRAVENOUS

## 2023-04-10 MED ORDER — FENTANYL CITRATE (PF) 100 MCG/2ML IJ SOLN
INTRAMUSCULAR | Status: AC
  Filled 2023-04-10: qty 2

## 2023-04-10 MED ORDER — MIDAZOLAM HCL 2 MG/2ML IJ SOLN
INTRAMUSCULAR | Status: AC
  Filled 2023-04-10: qty 2

## 2023-04-10 MED ORDER — LIDOCAINE-EPINEPHRINE (PF) 1 %-1:200000 IJ SOLN
INTRAMUSCULAR | Status: AC
  Filled 2023-04-10: qty 30

## 2023-04-10 MED ORDER — PHENYLEPHRINE 80 MCG/ML (10ML) SYRINGE FOR IV PUSH (FOR BLOOD PRESSURE SUPPORT)
PREFILLED_SYRINGE | INTRAVENOUS | Status: DC | PRN
  Administered 2023-04-10 (×7): 160 ug via INTRAVENOUS

## 2023-04-10 MED ORDER — SODIUM CHLORIDE 0.9 % IV SOLN: INTRAVENOUS | Status: DC | PRN

## 2023-04-10 MED ORDER — DROPERIDOL 2.5 MG/ML IJ SOLN: 0.6250 mg | Freq: Once | INTRAMUSCULAR | Status: DC | PRN

## 2023-04-10 MED ORDER — MIDAZOLAM HCL 2 MG/2ML IJ SOLN
INTRAMUSCULAR | Status: DC | PRN
  Administered 2023-04-10: 2 mg via INTRAVENOUS

## 2023-04-10 MED ORDER — DEXTROSE 50 % IV SOLN
INTRAVENOUS | Status: AC
  Filled 2023-04-10: qty 50

## 2023-04-10 MED ORDER — DEXTROSE 50 % IV SOLN
25.0000 mL | Freq: Once | INTRAVENOUS | Status: AC
Start: 2023-04-10 — End: 2023-04-10
  Administered 2023-04-10: 25 mL via INTRAVENOUS

## 2023-04-10 MED ORDER — OXYCODONE HCL 5 MG PO TABS: 5.0000 mg | ORAL_TABLET | Freq: Once | ORAL | Status: DC | PRN

## 2023-04-10 MED ORDER — LIDOCAINE-EPINEPHRINE (PF) 1 %-1:200000 IJ SOLN
INTRAMUSCULAR | Status: DC | PRN
  Administered 2023-04-10: 30 mL via SURGICAL_CAVITY

## 2023-04-10 MED ORDER — ESMOLOL HCL 100 MG/10ML IV SOLN
INTRAVENOUS | Status: DC | PRN
  Administered 2023-04-10: 10 mg via INTRAVENOUS

## 2023-04-10 MED ORDER — PROPOFOL 10 MG/ML IV BOLUS
INTRAVENOUS | Status: DC | PRN
  Administered 2023-04-10: 200 mg via INTRAVENOUS

## 2023-04-10 MED ORDER — BUPIVACAINE HCL (PF) 0.5 % IJ SOLN
INTRAMUSCULAR | Status: AC
  Filled 2023-04-10: qty 30

## 2023-04-10 MED ORDER — ONDANSETRON HCL 4 MG/2ML IJ SOLN
INTRAMUSCULAR | Status: DC | PRN
  Administered 2023-04-10 (×2): 4 mg via INTRAVENOUS

## 2023-04-10 MED ORDER — ACETAMINOPHEN 10 MG/ML IV SOLN: 1000.0000 mg | Freq: Once | INTRAVENOUS | Status: DC | PRN

## 2023-04-10 MED ORDER — VANCOMYCIN HCL IN DEXTROSE 1-5 GM/200ML-% IV SOLN
INTRAVENOUS | Status: AC
  Filled 2023-04-10: qty 200

## 2023-04-10 MED ORDER — FENTANYL CITRATE (PF) 100 MCG/2ML IJ SOLN
INTRAMUSCULAR | Status: DC | PRN
  Administered 2023-04-10 (×2): 50 ug via INTRAVENOUS

## 2023-04-10 MED ORDER — FENTANYL CITRATE (PF) 100 MCG/2ML IJ SOLN: 25.0000 ug | INTRAMUSCULAR | Status: DC | PRN

## 2023-04-10 MED ORDER — LIDOCAINE HCL (CARDIAC) PF 100 MG/5ML IV SOSY
PREFILLED_SYRINGE | INTRAVENOUS | Status: DC | PRN
  Administered 2023-04-10: 100 mg via INTRAVENOUS

## 2023-04-10 MED ORDER — SUCCINYLCHOLINE CHLORIDE 200 MG/10ML IV SOSY
PREFILLED_SYRINGE | INTRAVENOUS | Status: DC | PRN
  Administered 2023-04-10: 120 mg via INTRAVENOUS

## 2023-04-10 MED ORDER — SULFAMETHOXAZOLE-TRIMETHOPRIM 800-160 MG PO TABS: 1.0000 | ORAL_TABLET | Freq: Two times a day (BID) | ORAL | 0 refills | Status: DC

## 2023-04-10 SURGICAL SUPPLY — 42 items
CHLORAPREP W/TINT 26 (MISCELLANEOUS) ×2 IMPLANT
COVER MAYO STAND STRL (DRAPES) ×2 IMPLANT
COVER PROBE FLX POLY STRL (MISCELLANEOUS) ×2 IMPLANT
DERMABOND ADVANCED .7 DNX12 (GAUZE/BANDAGES/DRESSINGS) ×2 IMPLANT
DERMABOND ADVANCED .7 DNX6 (GAUZE/BANDAGES/DRESSINGS) IMPLANT
DRAPE C-ARM 42X72 X-RAY (DRAPES) ×2 IMPLANT
DRAPE INCISE 23X17 STRL (DRAPES) IMPLANT
DRAPE INCISE IOBAN 23X17 STRL (DRAPES) IMPLANT
DRAPE INCISE IOBAN 66X45 STRL (DRAPES) ×2 IMPLANT
DRAPE LAPAROTOMY TRNSV 106X77 (MISCELLANEOUS) ×2 IMPLANT
DRAPE SHEET LG 3/4 BI-LAMINATE (DRAPES) IMPLANT
DRAPE TABLE BACK 80X90 (DRAPES) ×2 IMPLANT
DRSG TEGADERM 2-3/8X2-3/4 SM (GAUZE/BANDAGES/DRESSINGS) ×2 IMPLANT
DRSG TEGADERM 4X4.75 (GAUZE/BANDAGES/DRESSINGS) ×2 IMPLANT
DRSG TELFA 3X8 NADH STRL (GAUZE/BANDAGES/DRESSINGS) ×2 IMPLANT
ELECT REM PT RETURN 9FT ADLT (ELECTROSURGICAL) ×2 IMPLANT
ELECTRODE REM PT RTRN 9FT ADLT (ELECTROSURGICAL) IMPLANT
GAUZE 4X4 16PLY ~~LOC~~+RFID DBL (SPONGE) ×2 IMPLANT
GAUZE SPONGE 4X4 12PLY STRL (GAUZE/BANDAGES/DRESSINGS) ×2 IMPLANT
GLOVE BIO SURGEON STRL SZ7.5 (GLOVE) ×2 IMPLANT
GOWN STRL REUS W/ TWL XL LVL3 (GOWN DISPOSABLE) ×2 IMPLANT
KIT HANDSET INTERSTIM COMM (NEUROSURGERY SUPPLIES) IMPLANT
KIT TURNOVER CYSTO (KITS) ×2 IMPLANT
KIT TURNOVER KIT A (KITS) ×2 IMPLANT
LEAD INTERSTIM 4.32 28 L (Lead) IMPLANT
MANIFOLD NEPTUNE II (INSTRUMENTS) ×2 IMPLANT
NDL HYPO 22X1.5 SAFETY MO (MISCELLANEOUS) IMPLANT
NEEDLE HYPO 22X1.5 SAFETY MO (MISCELLANEOUS) ×6 IMPLANT
NEUROSTIMULATOR 1.7X2X.06 (UROLOGICAL SUPPLIES) IMPLANT
PACK BASIN MINOR ARMC (MISCELLANEOUS) ×2 IMPLANT
STIMULATOR INTERSTIM 2X1.7X.3 (Miscellaneous) ×2 IMPLANT
SUT MNCRL 4-0 27 PS-2 XMFL (SUTURE) ×2 IMPLANT
SUT VIC AB 3-0 SH 27X BRD (SUTURE) ×4 IMPLANT
SUT VIC AB 4-0 PS2 18 (SUTURE) ×2 IMPLANT
SUT VIC AB 4-0 RB1 27X BRD (SUTURE) ×6 IMPLANT
SUTURE MNCRL 4-0 27XMF (SUTURE) ×2 IMPLANT
SYR BULB IRRIG 60ML STRL (SYRINGE) ×2 IMPLANT
SYR CONTROL 10ML LL (SYRINGE) IMPLANT
TOWEL OR 17X26 4PK STRL BLUE (TOWEL DISPOSABLE) ×4 IMPLANT
TRAP FLUID SMOKE EVACUATOR (MISCELLANEOUS) ×2 IMPLANT
TUBING CONNECTING 10 (TUBING) IMPLANT
WATER STERILE IRR 500ML POUR (IV SOLUTION) ×2 IMPLANT

## 2023-04-10 NOTE — Discharge Instructions (Addendum)
 I have reviewed discharge instructions in detail with the patient. They will follow-up with me or their physician as scheduled. My nurse will also be calling the patients as per protocol.   He can shower and remove dressings on Saturday.  I spoke to him about this.  I sent in pain medicine and antibiotics to the pharmacy and we will call him tomorrow and make certain he has a follow-up appointment.

## 2023-04-10 NOTE — Transfer of Care (Signed)
 Immediate Anesthesia Transfer of Care Note  Patient: Jonathan Austin  Procedure(s) Performed: Leane Platt IMPLANT FIRST STAGE WITH IMPEDENCE CHECK (Back) INTERSTIM IMPLANT SECOND STAGE (Buttocks)  Patient Location: PACU  Anesthesia Type:General  Level of Consciousness: awake, drowsy, and patient cooperative  Airway & Oxygen Therapy: Patient Spontanous Breathing and Patient connected to face mask oxygen  Post-op Assessment: Report given to RN and Post -op Vital signs reviewed and stable  Post vital signs: Reviewed and stable  Last Vitals:  Vitals Value Taken Time  BP 170/100 04/10/23 1416  Temp 36.1 C 04/10/23 1416  Pulse 95 04/10/23 1420  Resp 14 04/10/23 1420  SpO2 99 % 04/10/23 1420  Vitals shown include unfiled device data.  Last Pain:  Vitals:   04/10/23 1416  TempSrc:   PainSc: 0-No pain         Complications: No notable events documented.

## 2023-04-10 NOTE — Anesthesia Preprocedure Evaluation (Signed)
 Anesthesia Evaluation  Patient identified by MRN, date of birth, ID band Patient awake    Reviewed: Allergy & Precautions, H&P , NPO status , Patient's Chart, lab work & pertinent test results, reviewed documented beta blocker date and time   Airway Mallampati: III  TM Distance: >3 FB Neck ROM: full    Dental  (+) Teeth Intact, Poor Dentition   Pulmonary sleep apnea and Continuous Positive Airway Pressure Ventilation    Pulmonary exam normal        Cardiovascular Exercise Tolerance: Good hypertension, On Medications negative cardio ROS Normal cardiovascular exam Rhythm:regular Rate:Normal     Neuro/Psych  PSYCHIATRIC DISORDERS  Depression     Neuromuscular disease    GI/Hepatic Neg liver ROS,GERD  Medicated,,  Endo/Other  diabetes, Type 1, Insulin Dependent  Class 3 obesity  Renal/GU Renal disease  negative genitourinary   Musculoskeletal   Abdominal   Peds  Hematology negative hematology ROS (+)   Anesthesia Other Findings Past Medical History: No date: Alcohol use disorder, severe, dependence (HCC)     Comment:  05-24-2019 recent [facility] admission for suicide ideation and              alcohol / cocaine detox 05-04-2019 in epic,  pt stated               has been taking naltrexone daily as prescribed since and               no alcohol since No date: Cocaine abuse (HCC)     Comment:  05-24-2019  pt had recent [facility] admission, pt stated last               used  "crack" 05-03-2019 No date: Cocaine abuse with cocaine-induced mood disorder (HCC) No date: Depression No date: Eustachian tube dysfunction, bilateral No date: GERD (gastroesophageal reflux disease) No date: History of suicidal ideation     Comment:  multiple [facility] admission's;  last one admitted 05-04-2019 No date: Hyperlipidemia No date: Hyperplasia of prostate with lower urinary tract symptoms  (LUTS) No date: Hypertension     Comment:  followed by pcp No  date: Mixed stress and urge urinary incontinence No date: OSA on CPAP     Comment:  uses intermittently No date: Phimosis urologist-- dr winter: Prostate cancer [facility])     Comment:  dx 02/ 2021;  localized advanced, gleason 4+4, PSA 317 No date: Renal insufficiency No date: Sinus tachycardia No date: Thoracic aortic atherosclerosis (HCC) No date: Type 2 diabetes mellitus treated with insulin (HCC)     Comment:  followed by pcp   (05-24-2019  per stated checks blood               sugar twice daily, fasting sugar-- 180-200) No date: Wears glasses Past Surgical History: 05/29/2019: CIRCUMCISION; N/A     Comment:  Procedure: CIRCUMCISION ADULT;  Surgeon: Rene Paci, MD;  Location: [facility];  Service: Urology;  Laterality: N/A; 06/08/2021: CYSTOSCOPY WITH URETHRAL DILATATION; N/A     Comment:  Procedure: CYSTOSCOPY WITH URETHRAL DILATATION;                Surgeon: Riki Altes, MD;  Location: [facility];                Service: Urology;  Laterality: N/A; 03/06/2023:  INTERSTIM IMPLANT PLACEMENT; N/A     Comment:  Procedure: Percutaneous Temmporary Placement for               Peripheral Nerve Evaluation;  Surgeon: Alfredo Martinez,              MD;  Location: [facility];  Service: Urology;  Laterality:               N/A; No date: PROSTATE BIOPSY child: TONSILLECTOMY 05/29/2019: TRANSURETHRAL RESECTION OF PROSTATE; N/A     Comment:  Procedure: TRANSURETHRAL RESECTION OF THE PROSTATE               (TURP)/ CYSTOSCOPY;  Surgeon: Rene Paci,               MD;  Location[facility];  Service:               Urology;  Laterality: N/A; BMI    Body Mass Index: 36.81 kg/m     Reproductive/Obstetrics negative OB ROS                             Anesthesia Physical Anesthesia Plan  ASA: 3  Anesthesia Plan: General ETT   Post-op Pain Management:    Induction:   PONV Risk Score  and Plan: 3  Airway Management Planned:   Additional Equipment:   Intra-op Plan:   Post-operative Plan:   Informed Consent: I have reviewed the patients History and Physical, chart, labs and discussed the procedure including the risks, benefits and alternatives for the proposed anesthesia with the patient or authorized representative who has indicated his/her understanding and acceptance.     Dental Advisory Given  Plan Discussed with: CRNA  Anesthesia Plan Comments:        Anesthesia Quick Evaluation

## 2023-04-10 NOTE — H&P (Signed)
 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  Transurethral resection of prostate April 2021 by Dr. Liliane Shi.  Has had a radiation.  Recent PSA 0.1.  Has failed Vesicare and Myrbetriq for mixed incontinence.  In May 2023 had a urethral stricture dilated.  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  He had radiation changes in his bladder.   A few years ago he was seeing Dr. Laverle Patter.  He had positive lymph nodes.  It was recommended to have transurethral resection of the prostate prior to planned radiation for his significant lower urinary tract symptoms.  It was felt that he had locally advanced disease causing some of the symptoms.  According to Dr. Liliane Shi he had bothersome urgency frequency and a poor flow in spite of Flomax prior to surgery or he had a channel TURP and circumcision.  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  Prior to dilation he had worsening lower urinary tract symptoms including urgency and incontinence.  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  I think he has some rigidity in the prostatic urethra.  He had radiation changes.   Currently leaks a small amount with coughing sneezing bending lifting.  He leaks more when he is active.  Primary problem is urge incontinence.  He has moderately severe bedwetting.  He can soak 3-4 pads a day.  He said he was continent after the transurethral resection of the prostate and only started to leak a few months prior to the urethral dilation.  I do not see any urodynamics done prior to the transurethral section of the prostate.  I did not see bladder capacity measured under anesthesia  He voids every 1 hour.  No nocturia.  Flow was good.  Right-handed and has had a hernia repair.  He is an insulin-dependent diabetic.  He has failed Vesicare and Myrbetriq.  He had shoulder penis in a standing  position I think he did have a positive cough test.  Male genitalia otherwise normal     Patient has mixed incontinence.  He has bedwetting.  On suspect that he has a small spastic bladder.  He likely may also have urethral insufficiency which is less common postradiation and transurethral section of the prostate.  He has had a stricture which would make it sphincter more difficult to place in the bulbar urethra.  He may need repeat cystoscopy in the future.  Call if culture positive.  Order urodynamics and proceed accordingly.  He may need to be supine his bladder neck occluded with a small catheter to measure his bladder capacity if he starts leaking a lot at low volumes during urodynamics    Today When the patient saw the nurse practitioner recently he apparently had no insurance and therefore could not undergo urodynamics and cystoscopy. Patient still has high-volume mixed incontinence especially when his sugars are out of control.  He is on insulin.  He has sleep apnea.  He now has Medicaid and would like to organize the urodynamics.  Clinically not infected.   I urged him to follow-up with the cancer center in [facility] for his ongoing prostate cancer surveillance and treatment    Follow-up with urodynamics and proceed accordingly.  Currently his flow was good.  I am suspect that he has a small capacity overactive bladder in combination with urethral insufficiency    TOday  I last saw the patient in February 2024 and I do not think he had urodynamics for insurance reasons so had them at [facility].  In May he was seen by our nurse practitioner.  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  Referred to cancer center for further evaluation.   It appears the patient went to the emergency room September 16, 2022 difficulty voiding.  He was having retention symptoms.  He was on immunotherapy.  Fully catheter was placed for 450 mL. CT  scan demonstrated normal kidneys with no hydronephrosis.  Turns out he had a pop of culture that was treated and he will finish antibiotics tomorrow   Patient had urodynamics.  He voided 258 mL with a maximal flow of 33 mL/s and his residual is 3 mL.  Maximum bladder capacity was 256 mL.  He had increased bladder sensation.  Bladder was stable.  At lower volumes his leak point pressure was 102 cm water.  At higher volumes range between 140 and 163 cm of water.  During voiding he did well sustain detrusor contraction at 54 cm of water and acute backs at 9.5 mL/s.  EMG activity normal.                 PMH:     Past Medical History:  Diagnosis Date   Alcohol use disorder, severe, dependence (HCC)      05-24-2019 recent [facility] admission for suicide ideation and alcohol / cocaine detox 05-04-2019 in epic,  pt stated has been taking naltrexone daily as prescribed since and no alcohol since   Cocaine abuse (HCC)      05-24-2019  pt had recent [facility] admission, pt stated last used  "crack" 05-03-2019   Cocaine abuse with cocaine-induced mood disorder (HCC)     Depression     GERD (gastroesophageal reflux disease)     History of suicidal ideation      multiple [facility] admission's;  last one admitted 05-04-2019   Hyperplasia of prostate with lower urinary tract symptoms (LUTS)     Hypertension      followed by pcp   OSA on CPAP      uses intermittently   Phimosis     Prostate cancer [facility]) urologist-- dr winter    dx 02/ 2021;  localized advanced, gleason 4+4, PSA 317   Renal insufficiency     Sinus tachycardia     Thoracic aortic atherosclerosis (HCC)     Type 2 diabetes mellitus treated with insulin (HCC)      followed by pcp   (05-24-2019  per stated checks blood sugar twice daily, fasting sugar-- 180-200)   Wears glasses            Surgical History:      Past Surgical History:  Procedure Laterality Date   CIRCUMCISION N/A 05/29/2019    Procedure: CIRCUMCISION ADULT;  Surgeon: Rene Paci, MD;  Location: [facility];  Service: Urology;  Laterality: N/A;   CYSTOSCOPY WITH URETHRAL DILATATION N/A 06/08/2021    Procedure: CYSTOSCOPY WITH URETHRAL DILATATION;  Surgeon: Riki Altes, MD;  Location: [facility];  Service: Urology;  Laterality: N/A;   PROSTATE BIOPSY       TONSILLECTOMY   child   TRANSURETHRAL RESECTION OF PROSTATE N/A 05/29/2019    Procedure: TRANSURETHRAL RESECTION OF THE PROSTATE (TURP)/ CYSTOSCOPY;  Surgeon: Rene Paci, MD;  Location: [facility];  Service: Urology;  Laterality: N/A;          Home Medications:  Allergies as of 10/03/2022   No Known Allergies         Medication List           Accurate as of October 03, 2022  2:41 PM. If you have any questions, ask your nurse or doctor.              abiraterone acetate 250 MG tablet Commonly known as: ZYTIGA Take 4 tablets (1,000 mg total) by mouth daily. Take on an empty stomach 1 hour before or 2 hours after a meal    Accu-Chek Guide test strip Generic drug: glucose blood USE UP TO 4 TIMES A DAY AS DIRECTED    Accu-Chek Softclix Lancets lancets SMARTSIG:Topical 1-4 Times Daily    blood glucose meter kit and supplies Dispense based on patient and insurance preference. Use up to four times daily as directed. (FOR ICD-10 E10.9, E11.9).    Insulin Pen Needle 31G X 5 MM Misc 1 Needle by Does not apply route 2 (two) times daily.    NONFORMULARY OR COMPOUNDED ITEM Trimix (30/1/10)-(Pap/Phent/PGE)   Test Dose  1ml vial    Qty #3 Refills 0   [facility] [phone] Fax [phone]    NovoLOG Mix 70/30 FlexPen (70-30) 100 UNIT/ML FlexPen Generic drug: insulin aspart protamine - aspart Inject 20 Units into the skin 2 (two) times daily.    predniSONE 5 MG tablet Commonly known as: DELTASONE Take 1 tablet (5 mg total) by mouth daily with breakfast.             Allergies:  Allergies  No Known Allergies      Family History:      Family History  Problem Relation Age of Onset   Hypertension Mother     Hypertension Father     Diabetes Father     Prostate cancer Maternal Grandfather 42   Breast cancer Neg Hx     Colon cancer Neg Hx     Pancreatic cancer Neg Hx            Social History:  reports that he has never smoked. He has never been exposed to tobacco smoke. He has never used smokeless tobacco. He reports current alcohol use of about 2.0 standard drinks of alcohol per week. He reports current drug use. Drugs: Cocaine and Marijuana.   ROS:                           Physical Exam: There were no vitals taken for this visit.  Constitutional:  Alert and oriented, No acute distress. HEENT: [place] AT, moist mucus membranes.  Trachea midline, no masses.     Laboratory Data: Recent Labs       Lab Results  Component Value Date    WBC 6.1 09/26/2022    HGB 11.4 (L) 09/26/2022    HCT 34.0 (L) 09/26/2022    MCV 89.0 09/26/2022    PLT 509 (H) 09/26/2022        Recent Labs       Lab Results  Component Value Date    CREATININE 0.93 09/26/2022        Recent Labs  No results found for: "PSA"     Recent Labs       Lab Results  Component Value Date    TESTOSTERONE <3 (L) 09/02/2022  Recent Labs       Lab Results  Component Value Date    HGBA1C 11.8 (H) 04/11/2022        Urinalysis Labs (Brief)          Component Value Date/Time    COLORURINE YELLOW 09/16/2022 1702    APPEARANCEUR HAZY (A) 09/16/2022 1702    APPEARANCEUR Clear 03/28/2022 0902    LABSPEC 1.025 09/16/2022 1702    PHURINE 6.0 09/16/2022 1702    GLUCOSEU >=500 (A) 09/16/2022 1702    HGBUR SMALL (A) 09/16/2022 1702    BILIRUBINUR NEGATIVE 09/16/2022 1702    BILIRUBINUR Negative 03/28/2022 0902    KETONESUR NEGATIVE 09/16/2022 1702    PROTEINUR NEGATIVE 09/16/2022 1702    UROBILINOGEN 0.2 12/17/2014 1649    NITRITE NEGATIVE 09/16/2022 1702    LEUKOCYTESUR TRACE (A) 09/16/2022  1702        Pertinent Imaging:     Assessment & Plan: Patient has mixed incontinence and a complicated presentation with no quick treatment path.  He understands that approximately 70% of his problem is a small capacity overactive bladder resulting in urge incontinence and bedwetting.  He has mild urethral insufficiency causing the stress incontinence.  Placing artificial sphincter may not help the bedwetting or overactive bladder component, he recently had a urinary tract infection with retention, and a bulbar urethral stricture that would make placement of the sphincter more challenging or complicating in the future.   .  She saw the medical oncologist and started new immunotherapy in the last 4 to 6 weeks in [place].  I want him to have a trial of voiding tomorrow and another urine could be sent for culture.  I would like to have him see Dr. Lonna Cobb to make certain that his prostate cancer is appropriately managed.  He might benefit from cystoscopy but the catheter went in still likely if he has a stricture it is mild.  We want to make certain that his residuals remain low.  My neck step would be to try [facility] but this could complicate retention risks.  Third line overactive bladder treatment is something to consider and except the stress incontinence.   Gets more challenging for the patient to understand the complexity of his incontinence issue and we will continue to explain it to him moving forward. Patient understand the stepwise approach   1. Mixed stress and urge urinary incontinence

## 2023-04-10 NOTE — Op Note (Signed)
 Preoperative diagnosis: Refractory urgency incontinence Postoperative diagnosis: Refractory urgency incontinence Surgery: InterStim stage I and II and impedance check Surgeon: Dr. Lorin Picket Koree Staheli  The patient has the above diagnoses and consented the above procedure.  Extra care taken with positioning.  It was easy mark the S3 foramina bilaterally.  Preoperative antibiotics were given  10 cc of lidocaine epinephrine was used on the right side.  It was easy to find the S3 foramina.  He had excellent tone bellow response at low amplitude.  Inner aspect of foramen needle was removed.  Guide was placed the appropriate depth.  Scalpel incision was made in skin 1 cm.  White trocar was placed the appropriate depth.  It had to be placed a few more millimeters deeper because of mild resistance when I passed the lead.  In doing this maneuver the lead went in beautifully.  He had excellent tone bellow response at all 4 positions.  It was at 1.5 amplitude  AP and lateral x-rays were taken  Using soft tissue and bony landmarks I marked a 4-1/2 cm right upper buttock incision.  I was careful with the landmarks.  20 cc of lidocaine epinephrine mixture was utilized.  I dissected down with scalpel and cautery to the appropriate depth.  He had dense fat.  I did not go close the muscle.  I was diligent to stay in the right plane.  I had used cutting current and cautery for most of the dissection with minimal finger dissection but was very pleased with that  Lead was brought field to lateral with passer with the described technique.  It was attached to the IPG with screwdriver.  It laid in very nicely tension-free  Impedance was checked and normal in all 4-lead positions  I closed the midline incision with interrupted 4-0 Vicryl suture.  I closed right buttock incision with running 3-0 Vicryl subcutaneously followed by 4-0 Monocryl subcuticular.  Dressing was applied.  I was very pleased with surgery and hopefully it  reaches the patient's treatment goal

## 2023-04-10 NOTE — Anesthesia Procedure Notes (Signed)
 Procedure Name: Intubation Date/Time: 04/10/2023 1:03 PM  Performed by: Mohammed Kindle, CRNAPre-anesthesia Checklist: Patient identified, Emergency Drugs available, Suction available and Patient being monitored Patient Re-evaluated:Patient Re-evaluated prior to induction Oxygen Delivery Method: Circle system utilized Preoxygenation: Pre-oxygenation with 100% oxygen Induction Type: IV induction and Cricoid Pressure applied Ventilation: Oral airway inserted - appropriate to patient size Laryngoscope Size: McGrath and 3 Grade View: Grade II Tube type: Oral Number of attempts: 1 Airway Equipment and Method: Stylet and Oral airway Placement Confirmation: ETT inserted through vocal cords under direct vision, positive ETCO2 and breath sounds checked- equal and bilateral Secured at: 21 cm Tube secured with: Tape Dental Injury: Teeth and Oropharynx as per pre-operative assessment  Future Recommendations: Recommend- induction with short-acting agent, and alternative techniques readily available

## 2023-04-10 NOTE — Progress Notes (Signed)
 Cv and resp exam normal Questions answered  Post op described

## 2023-04-10 NOTE — Interval H&P Note (Signed)
 History and Physical Interval Note:  04/10/2023 11:38 AM  Jonathan Austin  has presented today for surgery, with the diagnosis of Refractory Urge Incontinence.  The various methods of treatment have been discussed with the patient and family. After consideration of risks, benefits and other options for treatment, the patient has consented to  Procedure(s): INTERSTIM IMPLANT FIRST STAGE WITH IMPEDENCE CHECK (N/A) INTERSTIM IMPLANT SECOND STAGE (N/A) as a surgical intervention.  The patient's history has been reviewed, patient examined, no change in status, stable for surgery.  I have reviewed the patient's chart and labs.  Questions were answered to the patient's satisfaction.     Charmine Bockrath A Murvin Gift

## 2023-04-11 ENCOUNTER — Other Ambulatory Visit: Payer: Self-pay | Admitting: Hematology and Oncology

## 2023-04-11 ENCOUNTER — Other Ambulatory Visit: Payer: Self-pay

## 2023-04-11 ENCOUNTER — Other Ambulatory Visit (HOSPITAL_COMMUNITY): Payer: Self-pay

## 2023-04-11 NOTE — Progress Notes (Signed)
 Specialty Pharmacy Refill Coordination Note  Jonathan Austin is a 58 y.o. male contacted today regarding refills of specialty medication(s) Abiraterone Acetate (ZYTIGA)   Patient requested Delivery   Delivery date: 04/21/23   Verified address: [address]   [place] [address]   Medication will be filled on 04/20/23.   This fill date is pending response to refill request from provider. Patient is aware and if they have not received fill by intended date, they must follow up with pharmacy.

## 2023-04-11 NOTE — Anesthesia Postprocedure Evaluation (Signed)
 Anesthesia Post Note  Patient: [name]  Procedure(s) Performed: INTERSTIM IMPLANT FIRST STAGE WITH IMPEDENCE CHECK (Back) INSERTION, SACRAL NERVE STIMULATOR, INTERSTIM, STAGE 2 (Buttocks)  Patient location during evaluation: PACU Anesthesia Type: General Level of consciousness: awake and alert Pain management: pain level controlled Vital Signs Assessment: post-procedure vital signs reviewed and stable Respiratory status: spontaneous breathing, nonlabored ventilation, respiratory function stable and patient connected to nasal cannula oxygen Cardiovascular status: blood pressure returned to baseline and stable Postop Assessment: no apparent nausea or vomiting Anesthetic complications: no   No notable events documented.   Last Vitals:  Vitals:   04/10/23 1500 04/10/23 1518  BP: (!) 143/81 (!) 147/84  Pulse: 100 96  Resp: 20 18  Temp: (!) 36.1 C (!) 36.2 C  SpO2: 97% 94%    Last Pain:  Vitals:   04/10/23 1518  TempSrc: Temporal  PainSc: 0-No pain                 Yevette Edwards

## 2023-04-12 ENCOUNTER — Telehealth: Payer: Self-pay

## 2023-04-12 ENCOUNTER — Other Ambulatory Visit: Payer: Self-pay

## 2023-04-12 MED ORDER — ABIRATERONE ACETATE 250 MG PO TABS
1000.0000 mg | ORAL_TABLET | Freq: Every day | ORAL | 0 refills | Status: DC
  Filled 2023-04-13: qty 120, 30d supply, fill #0

## 2023-04-12 NOTE — Telephone Encounter (Signed)
 Post-op call  Pt states he is doing well, no issues urinating, just a little sore at the incision site.  Post op appt scheduled for 04/17/2023 with Dr.Macdiarmid

## 2023-04-13 ENCOUNTER — Other Ambulatory Visit (HOSPITAL_COMMUNITY): Payer: Self-pay

## 2023-04-14 ENCOUNTER — Other Ambulatory Visit (HOSPITAL_COMMUNITY): Payer: Self-pay

## 2023-04-14 ENCOUNTER — Encounter: Payer: Self-pay | Admitting: Urology

## 2023-04-17 ENCOUNTER — Ambulatory Visit (INDEPENDENT_AMBULATORY_CARE_PROVIDER_SITE_OTHER): Payer: MEDICAID | Admitting: Urology

## 2023-04-17 ENCOUNTER — Inpatient Hospital Stay: Payer: MEDICAID | Admitting: Hematology and Oncology

## 2023-04-17 ENCOUNTER — Encounter: Payer: Self-pay | Admitting: Urology

## 2023-04-17 ENCOUNTER — Inpatient Hospital Stay: Payer: MEDICAID

## 2023-04-17 VITALS — BP 145/79 | HR 74

## 2023-04-17 DIAGNOSIS — N3946 Mixed incontinence: Secondary | ICD-10-CM

## 2023-04-17 LAB — MICROSCOPIC EXAMINATION

## 2023-04-17 LAB — URINALYSIS, COMPLETE
Bilirubin, UA: NEGATIVE
Glucose, UA: NEGATIVE
Ketones, UA: NEGATIVE
Leukocytes,UA: NEGATIVE
Nitrite, UA: NEGATIVE
Protein,UA: NEGATIVE
RBC, UA: NEGATIVE
Specific Gravity, UA: 1.015 (ref 1.005–1.030)
Urobilinogen, Ur: 1 mg/dL (ref 0.2–1.0)
pH, UA: 6 (ref 5.0–7.5)

## 2023-04-17 NOTE — Progress Notes (Signed)
 04/17/2023 8:50 AM   Jonathan Austin [birth year] [id number]  Referring provider: Irven Coe, MD [address] [place],  [place] [address]  Chief Complaint  Patient presents with   Routine Post Op    HPI: Patient had surgery April 10, 2023 with placement of InterStim.  Surgery went very well.  He has complex incontinence and a done beautifully with a peripheral nerve evaluation.  He has had good days and bad days in the first week but does not know how to utilize the device.  Incisions look excellent.   PMH: Past Medical History:  Diagnosis Date   Alcohol use disorder, severe, dependence (HCC)    05-24-2019 recent [facility] admission for suicide ideation and alcohol / cocaine detox 05-04-2019 in epic,  pt stated has been taking naltrexone daily as prescribed since and no alcohol since   Cocaine abuse (HCC)    05-24-2019  pt had recent [facility] admission, pt stated last used  "crack" 05-03-2019   Cocaine abuse with cocaine-induced mood disorder (HCC)    Depression    Eustachian tube dysfunction, bilateral    GERD (gastroesophageal reflux disease)    History of suicidal ideation    multiple [facility] admission's;  last one admitted 05-04-2019   Hyperlipidemia    Hyperplasia of prostate with lower urinary tract symptoms (LUTS)    Hypertension    followed by pcp   Mixed stress and urge urinary incontinence    OSA on CPAP    uses intermittently   Phimosis    Prostate cancer [facility]) urologist-- dr winter   dx 02/ 2021;  localized advanced, gleason 4+4, PSA 317   Renal insufficiency    Sinus tachycardia    Thoracic aortic atherosclerosis (HCC)    Type 2 diabetes mellitus treated with insulin (HCC)    followed by pcp   (05-24-2019  per stated checks blood sugar twice daily, fasting sugar-- 180-200)   Wears glasses     Surgical History: Past Surgical History:  Procedure Laterality Date   CIRCUMCISION N/A 05/29/2019   Procedure: CIRCUMCISION ADULT;  Surgeon: Rene Paci, MD;  Location: [facility];  Service: Urology;  Laterality: N/A;   CYSTOSCOPY WITH URETHRAL DILATATION N/A 06/08/2021   Procedure: CYSTOSCOPY WITH URETHRAL DILATATION;  Surgeon: Riki Altes, MD;  Location: [facility];  Service: Urology;  Laterality: N/A;   INTERSTIM IMPLANT PLACEMENT N/A 03/06/2023   Procedure: Percutaneous Temmporary Placement for Peripheral Nerve Evaluation;  Surgeon: Alfredo Martinez, MD;  Location: [facility];  Service: Urology;  Laterality: N/A;   INTERSTIM IMPLANT PLACEMENT N/A 04/10/2023   Procedure: Leane Platt IMPLANT FIRST STAGE WITH IMPEDENCE CHECK;  Surgeon: Alfredo Martinez, MD;  Location: [facility];  Service: Urology;  Laterality: N/A;   INTERSTIM IMPLANT PLACEMENT N/A 04/10/2023   Procedure: INSERTION, SACRAL NERVE STIMULATOR, INTERSTIM, STAGE 2;  Surgeon: Alfredo Martinez, MD;  Location: [facility];  Service: Urology;  Laterality: N/A;   PROSTATE BIOPSY     TONSILLECTOMY  child   TRANSURETHRAL RESECTION OF PROSTATE N/A 05/29/2019   Procedure: TRANSURETHRAL RESECTION OF THE PROSTATE (TURP)/ CYSTOSCOPY;  Surgeon: Rene Paci, MD;  Location: [facility];  Service: Urology;  Laterality: N/A;    Home Medications:  Allergies as of 04/17/2023   No Known Allergies      Medication List        Accurate as of April 17, 2023  8:50 AM. If you have any questions, ask your nurse or doctor.  abiraterone acetate 250 MG tablet Commonly known as: ZYTIGA Take 4 tablets (1,000 mg total) by mouth daily. Take on an empty stomach 1 hour before or 2 hours after a meal   Accu-Chek Guide test strip Generic drug: glucose blood USE UP TO 4 TIMES A DAY AS DIRECTED   Accu-Chek Softclix Lancets lancets SMARTSIG:Topical 1-4 Times Daily   acetaminophen 500 MG tablet Commonly known as: TYLENOL Take 500 mg by mouth every 6 (six) hours as needed (pain.).   amLODipine 5 MG tablet Commonly known as: NORVASC Take 1 tablet (5 mg  total) by mouth daily.   blood glucose meter kit and supplies Dispense based on patient and insurance preference. Use up to four times daily as directed. (FOR ICD-10 E10.9, E11.9).   Dexcom G7 Sensor Misc See admin instructions.   HumaLOG Mix 75/25 KwikPen (75-25) 100 UNIT/ML KwikPen Generic drug: Insulin Lispro Prot & Lispro Inject 25 Units into the skin in the morning and at bedtime.   Insulin Pen Needle 31G X 5 MM Misc 1 Needle by Does not apply route 2 (two) times daily.   oxyCODONE-acetaminophen 5-325 MG tablet Commonly known as: Percocet Take 1-2 tablets by mouth every 8 (eight) hours as needed for severe pain (pain score 7-10).   Ozempic (0.25 or 0.5 MG/DOSE) 2 MG/3ML Sopn Generic drug: Semaglutide(0.25 or 0.5MG /DOS) Inject 0.25 mg as directed every Wednesday.   predniSONE 5 MG tablet Commonly known as: DELTASONE Take 1 tablet (5 mg total) by mouth daily with breakfast.   rosuvastatin 20 MG tablet Commonly known as: CRESTOR Take 20 mg by mouth in the morning.   sulfamethoxazole-trimethoprim 800-160 MG tablet Commonly known as: Bactrim DS Take 1 tablet by mouth 2 (two) times daily.        Allergies: No Known Allergies  Family History: Family History  Problem Relation Age of Onset   Hypertension Mother    Hypertension Father    Diabetes Father    Prostate cancer Maternal Grandfather 6   Breast cancer Neg Hx    Colon cancer Neg Hx    Pancreatic cancer Neg Hx     Social History:  reports that he has never smoked. He has never been exposed to tobacco smoke. He has never used smokeless tobacco. He reports that he does not currently use alcohol after a past usage of about 2.0 standard drinks of alcohol per week. He reports that he does not currently use drugs after having used the following drugs: Cocaine and Marijuana.  ROS:                                        Physical Exam: BP (!) 145/79   Pulse 74   Constitutional:  Alert and  oriented, No acute distress. HEENT: [place] AT, moist mucus membranes.  Trachea midline, no masses. .  Laboratory Data: Lab Results  Component Value Date   WBC 4.5 01/23/2023   HGB 12.3 (L) 01/23/2023   HCT 36.7 (L) 01/23/2023   MCV 89.1 01/23/2023   PLT 326 01/23/2023    Lab Results  Component Value Date   CREATININE 0.86 01/23/2023    No results found for: "PSA"  Lab Results  Component Value Date   TESTOSTERONE <3 (L) 01/23/2023    Lab Results  Component Value Date   HGBA1C 11.8 (H) 04/11/2022    Urinalysis    Component Value Date/Time   COLORURINE  YELLOW 09/16/2022 1702   APPEARANCEUR Clear 03/13/2023 0841   LABSPEC 1.025 09/16/2022 1702   PHURINE 6.0 09/16/2022 1702   GLUCOSEU Negative 03/13/2023 0841   HGBUR SMALL (A) 09/16/2022 1702   BILIRUBINUR Negative 03/13/2023 0841   KETONESUR NEGATIVE 09/16/2022 1702   PROTEINUR 1+ (A) 03/13/2023 0841   PROTEINUR NEGATIVE 09/16/2022 1702   UROBILINOGEN 0.2 12/17/2014 1649   NITRITE Negative 03/13/2023 0841   NITRITE NEGATIVE 09/16/2022 1702   LEUKOCYTESUR 1+ (A) 03/13/2023 0841   LEUKOCYTESUR TRACE (A) 09/16/2022 1702    Pertinent Imaging:   Assessment & Plan: Patient will see our extender in the next 2 weeks for training on amplitude and programming I will reassess in 4 months.  I am very hopeful that this device will help him  1. Mixed stress and urge urinary incontinence (Primary)  - Urinalysis, Complete  2. Mixed incontinence  - Urinalysis, Complete   No follow-ups on file.  Martina Sinner, MD  [facility] [address], [address] [place], [place] [address] [phone]

## 2023-04-18 ENCOUNTER — Inpatient Hospital Stay: Payer: MEDICAID

## 2023-04-18 ENCOUNTER — Other Ambulatory Visit: Payer: Self-pay | Admitting: Hematology and Oncology

## 2023-04-18 ENCOUNTER — Inpatient Hospital Stay: Payer: MEDICAID | Admitting: Hematology and Oncology

## 2023-04-18 ENCOUNTER — Inpatient Hospital Stay: Payer: MEDICAID | Attending: Hematology and Oncology

## 2023-04-18 VITALS — BP 137/84 | HR 88 | Temp 97.6°F | Resp 16 | Wt 230.9 lb

## 2023-04-18 DIAGNOSIS — F1010 Alcohol abuse, uncomplicated: Secondary | ICD-10-CM | POA: Insufficient documentation

## 2023-04-18 DIAGNOSIS — F1410 Cocaine abuse, uncomplicated: Secondary | ICD-10-CM | POA: Insufficient documentation

## 2023-04-18 DIAGNOSIS — C772 Secondary and unspecified malignant neoplasm of intra-abdominal lymph nodes: Secondary | ICD-10-CM | POA: Diagnosis present

## 2023-04-18 DIAGNOSIS — Z8042 Family history of malignant neoplasm of prostate: Secondary | ICD-10-CM | POA: Diagnosis not present

## 2023-04-18 DIAGNOSIS — C61 Malignant neoplasm of prostate: Secondary | ICD-10-CM

## 2023-04-18 DIAGNOSIS — Z7952 Long term (current) use of systemic steroids: Secondary | ICD-10-CM | POA: Diagnosis not present

## 2023-04-18 DIAGNOSIS — R232 Flushing: Secondary | ICD-10-CM | POA: Insufficient documentation

## 2023-04-18 LAB — CBC WITH DIFFERENTIAL (CANCER CENTER ONLY)
Abs Immature Granulocytes: 0.01 10*3/uL (ref 0.00–0.07)
Basophils Absolute: 0.1 10*3/uL (ref 0.0–0.1)
Basophils Relative: 1 %
Eosinophils Absolute: 0.2 10*3/uL (ref 0.0–0.5)
Eosinophils Relative: 4 %
HCT: 34.5 % — ABNORMAL LOW (ref 39.0–52.0)
Hemoglobin: 11.8 g/dL — ABNORMAL LOW (ref 13.0–17.0)
Immature Granulocytes: 0 %
Lymphocytes Relative: 33 %
Lymphs Abs: 1.4 10*3/uL (ref 0.7–4.0)
MCH: 29.6 pg (ref 26.0–34.0)
MCHC: 34.2 g/dL (ref 30.0–36.0)
MCV: 86.5 fL (ref 80.0–100.0)
Monocytes Absolute: 0.5 10*3/uL (ref 0.1–1.0)
Monocytes Relative: 11 %
Neutro Abs: 2.1 10*3/uL (ref 1.7–7.7)
Neutrophils Relative %: 51 %
Platelet Count: 303 10*3/uL (ref 150–400)
RBC: 3.99 MIL/uL — ABNORMAL LOW (ref 4.22–5.81)
RDW: 13.1 % (ref 11.5–15.5)
WBC Count: 4.2 10*3/uL (ref 4.0–10.5)
nRBC: 0 % (ref 0.0–0.2)

## 2023-04-18 LAB — CMP (CANCER CENTER ONLY)
ALT: 18 U/L (ref 0–44)
AST: 13 U/L — ABNORMAL LOW (ref 15–41)
Albumin: 4 g/dL (ref 3.5–5.0)
Alkaline Phosphatase: 73 U/L (ref 38–126)
Anion gap: 4 — ABNORMAL LOW (ref 5–15)
BUN: 15 mg/dL (ref 6–20)
CO2: 28 mmol/L (ref 22–32)
Calcium: 8.7 mg/dL — ABNORMAL LOW (ref 8.9–10.3)
Chloride: 102 mmol/L (ref 98–111)
Creatinine: 1.1 mg/dL (ref 0.61–1.24)
GFR, Estimated: >60 mL/min (ref >60)
Glucose, Bld: 234 mg/dL — ABNORMAL HIGH (ref 70–99)
Potassium: 4.2 mmol/L (ref 3.5–5.1)
Sodium: 134 mmol/L — ABNORMAL LOW (ref 135–145)
Total Bilirubin: 0.4 mg/dL (ref 0.0–1.2)
Total Protein: 6.8 g/dL (ref 6.5–8.1)

## 2023-04-18 MED ORDER — LEUPROLIDE ACETATE (3 MONTH) 22.5 MG ~~LOC~~ KIT
22.5000 mg | PACK | Freq: Once | SUBCUTANEOUS | Status: AC
  Administered 2023-04-18: 22.5 mg via SUBCUTANEOUS
  Filled 2023-04-18: qty 22.5

## 2023-04-18 NOTE — Progress Notes (Signed)
 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 Gleason 4+4 adenocarcinoma 05/2019: TURP with Dr. Liliane Shi 10/22/2019: completed IMRT 05/10/2022: CT abdomen/pelvis w/ contrast showed stable exam. No evidence of metastatic disease within the abdomen or pelvis. 05/19/2022: NM PET (PSMA) showed focus of radiotracer accumulation in posterior prostatic apex, consistent with recurrent prostate carcinoma as well as sub-centimeter abdominal retroperitoneal lymph nodes show radiotracer accumulation, consistent with lymph node metastases 08/01/2022: establish care with Dr. Leonides Schanz.  Started Zytiga 1000 mg p.o. daily with prednisone 5 mg p.o. daily  Interval History:  Jonathan Austin 58 y.o. male with medical history significant for metastatic castrate sensitive prostate cancer who presents for a follow up visit. The patient's last visit was on 01/23/2023. In the interim since the last visit he has had no major changes in his health and continues on his Zytiga pills.  On exam today Jonathan Austin is accompanied by his wife.  He reports he has been well overall interim since our last visit.  He reports that he received a bladder stimulator and it is working quite well so far.  He reports that he has not had any accidents since it was installed.  He reports he is taking his Zytiga and prednisone every day faithfully.  He notes that he does have some occasional hot flashes and sweats but he is able to tolerate it well without any difficulty.  He notes he does not have any trouble with the Lupron shots such as redness, itching, rash.  His energy and appetite are strong.  Overall he is willing and able to proceed with  Zytiga and Lupron therapy at this time.   He otherwise denies any fevers, chills, sweats, nausea, vomiting or diarrhea.  A full 10 point ROS is otherwise negative.  MEDICAL HISTORY:  Past Medical History:  Diagnosis Date   Alcohol use disorder, severe, dependence (HCC)    05-24-2019 recent [facility] admission for suicide ideation and alcohol / cocaine detox 05-04-2019 in epic,  pt stated has been taking naltrexone daily as prescribed since and no alcohol since   Cocaine abuse (HCC)    05-24-2019  pt had recent [facility] admission, pt stated last used  "crack" 05-03-2019   Cocaine abuse with cocaine-induced mood disorder (HCC)    Depression    Eustachian tube dysfunction, bilateral    GERD (gastroesophageal reflux disease)    History of suicidal ideation    multiple [facility] admission's;  last one admitted 05-04-2019   Hyperlipidemia    Hyperplasia of prostate with lower urinary tract symptoms (LUTS)    Hypertension    followed by pcp   Mixed stress and urge urinary incontinence    OSA on CPAP    uses intermittently   Phimosis    Prostate cancer [facility]) urologist-- dr winter   dx 02/ 2021;  localized advanced, gleason 4+4, PSA 317   Renal insufficiency    Sinus tachycardia    Thoracic aortic atherosclerosis (HCC)    Type 2 diabetes mellitus treated with insulin (HCC)    followed by pcp   (05-24-2019  per stated checks blood sugar twice daily, fasting sugar-- 180-200)   Wears glasses     SURGICAL HISTORY: Past Surgical History:  Procedure Laterality Date  CIRCUMCISION N/A 05/29/2019   Procedure: CIRCUMCISION ADULT;  Surgeon: Rene Paci, MD;  Location: [facility];  Service: Urology;  Laterality: N/A;   CYSTOSCOPY WITH URETHRAL DILATATION N/A 06/08/2021   Procedure: CYSTOSCOPY WITH URETHRAL DILATATION;  Surgeon: Riki Altes, MD;  Location: [facility];  Service: Urology;  Laterality: N/A;   INTERSTIM IMPLANT PLACEMENT N/A 03/06/2023   Procedure: Percutaneous Temmporary  Placement for Peripheral Nerve Evaluation;  Surgeon: Alfredo Martinez, MD;  Location: [facility];  Service: Urology;  Laterality: N/A;   INTERSTIM IMPLANT PLACEMENT N/A 04/10/2023   Procedure: Leane Platt IMPLANT FIRST STAGE WITH IMPEDENCE CHECK;  Surgeon: Alfredo Martinez, MD;  Location: [facility];  Service: Urology;  Laterality: N/A;   INTERSTIM IMPLANT PLACEMENT N/A 04/10/2023   Procedure: INSERTION, SACRAL NERVE STIMULATOR, INTERSTIM, STAGE 2;  Surgeon: Alfredo Martinez, MD;  Location: [facility];  Service: Urology;  Laterality: N/A;   PROSTATE BIOPSY     TONSILLECTOMY  child   TRANSURETHRAL RESECTION OF PROSTATE N/A 05/29/2019   Procedure: TRANSURETHRAL RESECTION OF THE PROSTATE (TURP)/ CYSTOSCOPY;  Surgeon: Rene Paci, MD;  Location: [facility];  Service: Urology;  Laterality: N/A;    SOCIAL HISTORY: Social History   Socioeconomic History   Marital status: Married    Spouse name: Doris   Number of children: 1   Years of education: 12   Highest education level: GED or equivalent  Occupational History   Not on file  Tobacco Use   Smoking status: Never    Passive exposure: Never   Smokeless tobacco: Never  Vaping Use   Vaping status: Never Used  Substance and Sexual Activity   Alcohol use: Not Currently    Alcohol/week: 2.0 standard drinks of alcohol    Types: 2 Cans of beer per week   Drug use: Not Currently    Types: Cocaine, Marijuana    Comment: smokes crack cocaine 2023   Sexual activity: Not Currently  Other Topics Concern   Not on file  Social History Narrative   Only child is deceased   Social Drivers of Corporate investment banker Strain: Not on file  Food Insecurity: No Food Insecurity (04/11/2022)   Hunger Vital Sign    Worried About Running Out of Food in the Last Year: Never true    Ran Out of Food in the Last Year: Never true  Transportation Needs: No Transportation Needs (04/11/2022)   PRAPARE - Scientist, research (physical sciences) (Medical): No    Lack of Transportation (Non-Medical): No  Physical Activity: Not on file  Stress: Not on file  Social Connections: Not on file  Intimate Partner Violence: Not At Risk (04/11/2022)   Humiliation, Afraid, Rape, and Kick questionnaire    Fear of Current or Ex-Partner: No    Emotionally Abused: No    Physically Abused: No    Sexually Abused: No    FAMILY HISTORY: Family History  Problem Relation Age of Onset   Hypertension Mother    Hypertension Father    Diabetes Father    Prostate cancer Maternal Grandfather 77   Breast cancer Neg Hx    Colon cancer Neg Hx    Pancreatic cancer Neg Hx     ALLERGIES:  has no known allergies.  MEDICATIONS:  Current Outpatient Medications  Medication Sig Dispense Refill   abiraterone acetate (ZYTIGA) 250 MG tablet Take 4 tablets (1,000 mg total) by mouth daily. Take on an empty stomach 1 hour before or 2  hours after a meal 120 tablet 0   ACCU-CHEK GUIDE test strip USE UP TO 4 TIMES A DAY AS DIRECTED     Accu-Chek Softclix Lancets lancets SMARTSIG:Topical 1-4 Times Daily     acetaminophen (TYLENOL) 500 MG tablet Take 500 mg by mouth every 6 (six) hours as needed (pain.).     amLODipine (NORVASC) 5 MG tablet Take 1 tablet (5 mg total) by mouth daily. 90 tablet 1   blood glucose meter kit and supplies Dispense based on patient and insurance preference. Use up to four times daily as directed. (FOR ICD-10 E10.9, E11.9). 1 each 0   Continuous Glucose Sensor (DEXCOM G7 SENSOR) MISC See admin instructions.     HUMALOG MIX 75/25 KWIKPEN (75-25) 100 UNIT/ML KwikPen Inject 25 Units into the skin in the morning and at bedtime.     Insulin Pen Needle 31G X 5 MM MISC 1 Needle by Does not apply route 2 (two) times daily. 100 each 0   oxyCODONE-acetaminophen (PERCOCET) 5-325 MG tablet Take 1-2 tablets by mouth every 8 (eight) hours as needed for severe pain (pain score 7-10). 10 tablet 0   OZEMPIC, 0.25 OR 0.5 MG/DOSE, 2 MG/3ML SOPN  Inject 0.25 mg as directed every Wednesday.     predniSONE (DELTASONE) 5 MG tablet Take 1 tablet (5 mg total) by mouth daily with breakfast. 90 tablet 1   rosuvastatin (CRESTOR) 20 MG tablet Take 20 mg by mouth in the morning.     sulfamethoxazole-trimethoprim (BACTRIM DS) 800-160 MG tablet Take 1 tablet by mouth 2 (two) times daily. 10 tablet 0   No current facility-administered medications for this visit.    REVIEW OF SYSTEMS:   Constitutional: ( - ) fevers, ( - )  chills , ( - ) night sweats Eyes: ( - ) blurriness of vision, ( - ) double vision, ( - ) watery eyes Ears, nose, mouth, throat, and face: ( - ) mucositis, ( - ) sore throat Respiratory: ( - ) cough, ( - ) dyspnea, ( - ) wheezes Cardiovascular: ( - ) palpitation, ( - ) chest discomfort, ( - ) lower extremity swelling Gastrointestinal:  ( - ) nausea, ( - ) heartburn, ( - ) change in bowel habits Skin: ( - ) abnormal skin rashes Lymphatics: ( - ) new lymphadenopathy, ( - ) easy bruising Neurological: ( - ) numbness, ( - ) tingling, ( - ) new weaknesses Behavioral/Psych: ( - ) mood change, ( - ) new changes  All other systems were reviewed with the patient and are negative.  PHYSICAL EXAMINATION:  Vitals:   04/18/23 1031  BP: 137/84  Pulse: 88  Resp: 16  Temp: 97.6 F (36.4 C)  SpO2: 99%    Filed Weights   04/18/23 1031  Weight: 230 lb 14.4 oz (104.7 kg)    GENERAL: Well-appearing middle-aged African-American male, alert, no distress and comfortable SKIN: skin color, texture, turgor are normal, no rashes or significant lesions EYES: conjunctiva are pink and non-injected, sclera clear LUNGS: clear to auscultation and percussion with normal breathing effort HEART: regular rate & rhythm and no murmurs and no lower extremity edema Musculoskeletal: no cyanosis of digits and no clubbing  PSYCH: alert & oriented x 3, fluent speech NEURO: no focal motor/sensory deficits  LABORATORY DATA:  I have reviewed the data as  listed    Latest Ref Rng & Units 04/18/2023    9:58 AM 01/23/2023   10:45 AM 10/31/2022    2:37 PM  CBC  WBC 4.0 - 10.5 K/uL 4.2  4.5  4.9   Hemoglobin 13.0 - 17.0 g/dL 52.8  41.3  24.4   Hematocrit 39.0 - 52.0 % 34.5  36.7  36.2   Platelets 150 - 400 K/uL 303  326  329        Latest Ref Rng & Units 04/18/2023    9:58 AM 01/23/2023   10:45 AM 10/31/2022    2:37 PM  CMP  Glucose 70 - 99 mg/dL 010  79  272   BUN 6 - 20 mg/dL 15  11  17    Creatinine 0.61 - 1.24 mg/dL 5.36  6.44  0.34   Sodium 135 - 145 mmol/L 134  139  137   Potassium 3.5 - 5.1 mmol/L 4.2  3.9  4.8   Chloride 98 - 111 mmol/L 102  107  102   CO2 22 - 32 mmol/L 28  29  29    Calcium 8.9 - 10.3 mg/dL 8.7  9.5  74.2   Total Protein 6.5 - 8.1 g/dL 6.8  7.1  7.7   Total Bilirubin 0.0 - 1.2 mg/dL 0.4  0.3  0.5   Alkaline Phos 38 - 126 U/L 73  61  77   AST 15 - 41 U/L 13  24  14    ALT 0 - 44 U/L 18  22  22     RADIOGRAPHIC STUDIES: DG Lumbar Spine 2-3 Views Result Date: 04/10/2023 CLINICAL DATA:  Elective surgery.  InterStim implant. EXAM: LUMBAR SPINE - 2-3 VIEW COMPARISON:  None Available. FINDINGS: Five fluoroscopic spot views of the lower lumbar spine and sacrum submitted in the operating room. Surgical instruments project over the sacrum with subsequent stimulator placement, tip appears presacral. Fluoroscopy time 37 seconds. Dose 23.93 mGy. IMPRESSION: Intraoperative fluoroscopy during sacral stimulator placement. Electronically Signed   By: Narda Rutherford M.D.   On: 04/10/2023 15:42   DG C-Arm 1-60 Min-No Report Result Date: 04/10/2023 Fluoroscopy was utilized by the requesting physician.  No radiographic interpretation.    ASSESSMENT & PLAN Jonathan Austin 58 y.o. male with medical history significant for metastatic castrate sensitive prostate cancer who presents for a follow up visit.  After review of the labs, review of the records, and discussion with the patient the patients findings are most consistent with  castrate sensitive prostate cancer with metastatic spread to the lymph nodes.   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 First dose on 08/08/2022. Dose to be administered today. Next due June 2025.  -- labs today show WBC 4.2, Hgb 11.8, MCV 86.5, Plt 303 --Last PSA in September 2024 was less than 0.1.  Repeated today. --RTC q 12-week visits to coincide with his Lupron shots.   #Polysubstance Abuse -- patient drinks 6 pack of beer daily  -- patient smokes 1 g of crack cocaine daily -- discussed cutting back/cessation of substances today    No orders of the defined types were placed in this encounter.   All questions were answered. The patient knows to call the clinic with any problems, questions or concerns.  A total of more than 30 minutes were spent on this encounter with face-to-face time and non-face-to-face time, including preparing to see the patient, ordering tests and/or medications, counseling the patient and coordination of care as  outlined above.   Ulysees Barns, MD Department of Hematology/Oncology [facility] Phone: [phone] Pager: [phone] Email: [email or web address]  04/23/2023 11:52 AM

## 2023-04-19 LAB — TESTOSTERONE: Testosterone: <3 ng/dL — ABNORMAL LOW (ref 264–916)

## 2023-04-19 LAB — PROSTATE-SPECIFIC AG, SERUM (LABCORP): Prostate Specific Ag, Serum: <0.1 ng/mL (ref 0.0–4.0)

## 2023-04-20 ENCOUNTER — Telehealth: Payer: Self-pay | Admitting: *Deleted

## 2023-04-20 NOTE — Telephone Encounter (Signed)
-----   Message from Jonathan Austin sent at 04/20/2023  8:42 AM EDT ----- Please let Mr. Quillin know that his PSA and testosterone levels remain undetectable. He is doing excellent. We will continue with his q 3 month lupron and daily zytiga. ----- Message ----- From: Leory Plowman, Lab In [place] Sent: 04/18/2023  10:07 AM EDT To: Jaci Standard, MD

## 2023-04-20 NOTE — Telephone Encounter (Signed)
 TCT patient regarding recent lab results. No answer but was able to leave vm message for pt to return this call to [phone] at his earliest convenience

## 2023-04-23 ENCOUNTER — Encounter: Payer: Self-pay | Admitting: Hematology and Oncology

## 2023-04-24 ENCOUNTER — Ambulatory Visit: Payer: MEDICAID | Admitting: Urology

## 2023-04-24 ENCOUNTER — Telehealth: Payer: Self-pay | Admitting: *Deleted

## 2023-04-24 NOTE — Telephone Encounter (Signed)
 Received call back from pt regarding recent lab results.  Advised that his PSA and testosterone levels remain undetectable. He is doing excellent. We will continue with his q 3 month lupron and daily zytiga.  Pt voiced understanding. Aware of future appts.

## 2023-04-24 NOTE — Telephone Encounter (Signed)
-----   Message from Ulysees Barns IV sent at 04/20/2023  8:42 AM EDT ----- Please let Mr. Quillin know that his PSA and testosterone levels remain undetectable. He is doing excellent. We will continue with his q 3 month lupron and daily zytiga. ----- Message ----- From: Leory Plowman, Lab [facility] [place] Sent: 04/18/2023  10:07 AM EDT To: Jaci Standard, MD

## 2023-05-01 ENCOUNTER — Ambulatory Visit (INDEPENDENT_AMBULATORY_CARE_PROVIDER_SITE_OTHER): Payer: MEDICAID | Admitting: Physician Assistant

## 2023-05-01 VITALS — BP 152/92 | HR 101 | Ht 67.0 in | Wt 230.0 lb

## 2023-05-01 DIAGNOSIS — N3946 Mixed incontinence: Secondary | ICD-10-CM | POA: Diagnosis not present

## 2023-05-01 NOTE — Patient Instructions (Signed)
 Your Interstim Components  Your device has three components: The implant, which you can feel in your buttock The communicator, which is the white and blue flat credit card-sized device you place over your implant when you want to adjust its settings The remote, which is the [facility] cell phone device where you adjust your settings  Think of your implant as a television, the remote as your remote control where you change the settings and turn it on and off, and the communicator as the receiver the TV needs to get instructions from the remote control. Once you turn the TV on, it's going to stay on until you turn it off. If the batteries in the remote or the communicator die, the TV will keep running. That means that your device will keep working if either of the two other components are powered off, run out of battery, or are not physically with you. In fact, you only need the communicator and remote when you are adjusting your settings (or coming to clinic to get help)--otherwise you can leave them at home!  How to Adjust Your Interstim Settings  Today we set your device to Program 1, Intensity 1.0.  Your device has 7 different programs, and you may need to try a few of them to achieve maximum results. Once you find a program that is working well for you, you may stay on it.  It takes time after changing the program to see results. Every time you change the program, you should plan to stay on that program for 2 full weeks before moving on to the next one. After the first week, if your symptoms are no better, then you may increase the intensity--but keep the program the same. If after 2 weeks on the same program you still notice no difference, then you may switch to a different program and repeat the process.   Tracking Your Bladder Symptoms   Patient Name:___________________________________________________  Example: Day   Daytime Voids  Nighttime Voids Urgency for the Day (none, mild, strong,  severe) Number of Accidents/ Leaks Beverage Comments  Monday IIII II Strong I Water IIII Coffee  I     Week Starting:____________________________________  Day Daytime  Voids Nighttime  Voids Urgency for the Day (none, mild, strong, severe) Number of Accidents/ Leaks Beverages Comments                                                           This week my symptoms were:  O much better O better O the same O worse

## 2023-05-01 NOTE — Progress Notes (Signed)
 Patient presented today for InterStim teaching, s/p placement with Dr. Sherron Monday on 04/10/2023.  We discussed the different components including stimulator, communicator, and remote. I demonstrated how to change the settings and we discussed how to proceed through 2-week trials of each program until he finds one that gives him at least 50% improvement in his symptoms.  At the conclusion of today's visit, his device was set to P1, 1.0 and he felt tingling in his urethra.

## 2023-05-16 ENCOUNTER — Other Ambulatory Visit: Payer: Self-pay | Admitting: Hematology and Oncology

## 2023-05-16 ENCOUNTER — Other Ambulatory Visit (HOSPITAL_COMMUNITY): Payer: Self-pay

## 2023-05-16 ENCOUNTER — Other Ambulatory Visit: Payer: Self-pay

## 2023-05-16 MED ORDER — ABIRATERONE ACETATE 250 MG PO TABS
1000.0000 mg | ORAL_TABLET | Freq: Every day | ORAL | 0 refills | Status: DC
  Filled 2023-05-16: qty 120, 30d supply, fill #0

## 2023-05-16 MED ORDER — PREDNISONE 5 MG PO TABS
5.0000 mg | ORAL_TABLET | Freq: Every day | ORAL | 1 refills | Status: DC
  Filled 2023-05-16: qty 30, 30d supply, fill #0
  Filled 2023-06-12: qty 30, 30d supply, fill #1
  Filled 2023-07-13: qty 30, 30d supply, fill #2
  Filled 2023-08-04 – 2023-08-08 (×2): qty 30, 30d supply, fill #3
  Filled 2023-09-05 – 2023-09-07 (×3): qty 30, 30d supply, fill #4
  Filled 2023-10-02: qty 30, 30d supply, fill #5

## 2023-05-16 NOTE — Progress Notes (Signed)
 Specialty Pharmacy Ongoing Clinical Assessment Note  Jonathan Austin is a 58 y.o. male who is being followed by the specialty pharmacy service for RxSp Oncology   Patient's specialty medication(s) reviewed today: Abiraterone Acetate (ZYTIGA)   Missed doses in the last 4 weeks: 1   Patient/Caregiver did not have any additional questions or concerns.   Therapeutic benefit summary: Patient is achieving benefit   Adverse events/side effects summary: No adverse events/side effects   Patient's therapy is appropriate to: Continue    Goals Addressed             This Visit's Progress    Slow Disease Progression   On track    Patient is on track. Patient will maintain adherence.  The most recent PSA was <0.1.          Follow up:  6 months  Otto Herb Specialty Pharmacist

## 2023-05-16 NOTE — Progress Notes (Signed)
 Specialty Pharmacy Refill Coordination Note  Jonathan Austin is a 58 y.o. male contacted today regarding refills of specialty medication(s) Abiraterone Acetate (ZYTIGA)   Patient requested Delivery   Delivery date: 05/24/23   Verified address: [address]   [place] [address]   Medication will be filled on 05/23/23.

## 2023-05-23 ENCOUNTER — Other Ambulatory Visit: Payer: Self-pay

## 2023-05-23 ENCOUNTER — Other Ambulatory Visit (HOSPITAL_COMMUNITY): Payer: Self-pay

## 2023-05-31 ENCOUNTER — Encounter: Payer: Self-pay | Admitting: Podiatry

## 2023-05-31 ENCOUNTER — Ambulatory Visit (INDEPENDENT_AMBULATORY_CARE_PROVIDER_SITE_OTHER): Payer: MEDICAID | Admitting: Podiatry

## 2023-05-31 DIAGNOSIS — M79675 Pain in left toe(s): Secondary | ICD-10-CM

## 2023-05-31 DIAGNOSIS — B351 Tinea unguium: Secondary | ICD-10-CM | POA: Diagnosis not present

## 2023-05-31 DIAGNOSIS — M79674 Pain in right toe(s): Secondary | ICD-10-CM | POA: Diagnosis not present

## 2023-05-31 DIAGNOSIS — E1142 Type 2 diabetes mellitus with diabetic polyneuropathy: Secondary | ICD-10-CM

## 2023-05-31 NOTE — Progress Notes (Signed)
This patient returns to my office for at risk foot care.  This patient requires this care by a professional since this patient will be at risk due to having diabetes.  This patient is unable to cut nails himself since the patient cannot reach his nails.These nails are painful walking and wearing shoes.  He presents to the office with male caregiver.This patient presents for at risk foot care today.  General Appearance  Alert, conversant and in no acute stress.  Vascular  Dorsalis pedis and posterior tibial  pulses are palpable  bilaterally.  Capillary return is within normal limits  bilaterally. Temperature is within normal limits  bilaterally.  Neurologic  Senn-Weinstein monofilament wire test diminished  bilaterally. Muscle power within normal limits bilaterally.  Nails Thick disfigured discolored nails with subungual debris  from hallux to fifth toes bilaterally. No evidence of bacterial infection or drainage bilaterally.  Orthopedic  No limitations of motion  feet .  No crepitus or effusions noted.  No bony pathology or digital deformities noted.  Skin  normotropic skin with no porokeratosis noted bilaterally.  No signs of infections or ulcers noted.     Onychomycosis  Pain in right toes  Pain in left toes  Consent was obtained for treatment procedures.   Mechanical debridement of nails 1-5  bilaterally performed with a nail nipper.  Filed with dremel without incident.    Return office visit     3 months                Told patient to return for periodic foot care and evaluation due to potential at risk complications.   Helane Gunther DPM

## 2023-06-12 ENCOUNTER — Other Ambulatory Visit: Payer: Self-pay | Admitting: Hematology and Oncology

## 2023-06-12 ENCOUNTER — Other Ambulatory Visit: Payer: Self-pay

## 2023-06-12 ENCOUNTER — Other Ambulatory Visit (HOSPITAL_COMMUNITY): Payer: Self-pay

## 2023-06-12 MED ORDER — ABIRATERONE ACETATE 250 MG PO TABS
1000.0000 mg | ORAL_TABLET | Freq: Every day | ORAL | 0 refills | Status: DC
  Filled 2023-06-12 (×2): qty 120, 30d supply, fill #0

## 2023-06-12 NOTE — Progress Notes (Signed)
 Specialty Pharmacy Refill Coordination Note  Jonathan Austin is a 58 y.o. male contacted today regarding refills of specialty medication(s) Abiraterone  Acetate (ZYTIGA )   Patient requested Delivery   Delivery date: 06/20/23   Verified address: [address]   [place] [address]   Medication will be filled on 06/19/23.

## 2023-06-27 ENCOUNTER — Other Ambulatory Visit: Payer: Self-pay

## 2023-06-29 ENCOUNTER — Telehealth: Payer: Self-pay

## 2023-06-29 NOTE — Telephone Encounter (Signed)
 Pt calls triage line and states that he is still experiencing significant urinary leakage. He states he would like to come back into the office for additional teaching on changing the program for his interstim. Patient scheduled.

## 2023-07-05 ENCOUNTER — Ambulatory Visit (INDEPENDENT_AMBULATORY_CARE_PROVIDER_SITE_OTHER): Payer: MEDICAID | Admitting: Physician Assistant

## 2023-07-05 VITALS — BP 137/90 | HR 91 | Ht 67.0 in | Wt 233.0 lb

## 2023-07-05 DIAGNOSIS — N529 Male erectile dysfunction, unspecified: Secondary | ICD-10-CM

## 2023-07-05 DIAGNOSIS — N3946 Mixed incontinence: Secondary | ICD-10-CM

## 2023-07-05 MED ORDER — AMBULATORY NON FORMULARY MEDICATION: 0 refills | Status: AC

## 2023-07-05 NOTE — Patient Instructions (Addendum)
 Your Interstim is currently set to Program 2, Intensity 2.8. Next Wednesday, you may increase the Intensity as desired until you feel a light fluttering sensation. This sensation should go away within 10-15 minutes. In 2 weeks, if your leakage is no better, change to Program 3 and increase the Intensity until you feel a light flutter sensation, then repeat this 2-week process as you move through the programs. You may stop when you find a program that works well for you.  I'm getting you set up for another Trimix appointment with Cathleen Coach. DO NOT PICK UP YOUR MEDICINE EARLIER THAN 1 WEEK PRIOR TO YOUR APPOINTMENT. BRING YOUR MEDICINE WITH YOU TO CLINIC FOR YOUR APPOINTMENT.

## 2023-07-05 NOTE — Progress Notes (Signed)
 Patient presents to clinic today for InterStim management. He reports no improvement in his leakage since I last saw him. He has remained on P1 but increased the intensity to 2.8 about 2 weeks ago. Sensation remained in the perineum, though he cannot feel it today.  Setting on arrival: P1, 2.8  At the conclusion of today's visit, his device was set to P2, setting 1.6. He felt a light flutter in the perineum. We reviewed instructions including possibly increasing the intensity after 1 week, and after 2 weeks if no better moving on to P3. We discussed testing each program in ascending order P1-P7 in 2-week intervals, stopping when he finds a program that works well for him.  Additionally, he would like to schedule a Trimix re-titration with Cathleen Coach. He previously failed 0.6mL of Trimix; will order Super Trimix and bring him back in for an appointment.  Phaedra Colgate, PA-C 07/05/23 10:27 AM

## 2023-07-07 ENCOUNTER — Other Ambulatory Visit: Payer: Self-pay | Admitting: Hematology and Oncology

## 2023-07-08 ENCOUNTER — Other Ambulatory Visit: Payer: Self-pay | Admitting: Hematology and Oncology

## 2023-07-09 NOTE — Progress Notes (Unsigned)
 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 Gleason 4+4 adenocarcinoma 05/2019: TURP with Jonathan Austin 10/22/2019: completed IMRT 05/10/2022: CT abdomen/pelvis w/ contrast showed stable exam. No evidence of metastatic disease within the abdomen or pelvis. 05/19/2022: NM PET (PSMA) showed focus of radiotracer accumulation in posterior prostatic apex, consistent with recurrent prostate carcinoma as well as sub-centimeter abdominal retroperitoneal lymph nodes show radiotracer accumulation, consistent with lymph node metastases 08/01/2022: establish care with Jonathan Austin.  Started Zytiga  1000 mg p.o. daily with prednisone  5 mg p.o. daily  Interval History:  Jonathan Austin 58 y.o. male with medical history significant for metastatic castrate sensitive prostate cancer who presents for a follow up visit. The patient's last visit was on 04/18/2023. In the interim since the last visit he has had no major changes in his health and continues on his Zytiga  pills.  On exam today Jonathan Austin reports he has been well overall in the interim since her last visit.  He reports he is "still working on his bladder".  He is still trying to get his urinary issues under control.  He reports he continues to take his Zytiga  1000 mg p.o. daily with prednisone  at 5 mg.  He reports he very rarely misses a dose.  He notes he normally takes a dose faithfully on time but occasionally he does not take it precisely when he should.  He reports that he has good energy and appetite.  He reports he is tolerating his Lupron  shots well with no side effects such as hot flashes or sweats.  Overall he feels well and is willing and able to  continue on prostate cancer therapy at this time.  A full 10 point ROS is otherwise negative.  MEDICAL HISTORY:  Past Medical History:  Diagnosis Date   Alcohol use disorder, severe, dependence (HCC)    05-24-2019 recent [facility] admission for suicide ideation and alcohol / cocaine detox 05-04-2019 in epic,  pt stated has been taking naltrexone  daily as prescribed since and no alcohol since   Cocaine abuse (HCC)    05-24-2019  pt had recent [facility] admission, pt stated last used  "crack" 05-03-2019   Cocaine abuse with cocaine-induced mood disorder (HCC)    Depression    Eustachian tube dysfunction, bilateral    GERD (gastroesophageal reflux disease)    History of suicidal ideation    multiple [facility] admission's;  last one admitted 05-04-2019   Hyperlipidemia    Hyperplasia of prostate with lower urinary tract symptoms (LUTS)    Hypertension    followed by pcp   Mixed stress and urge urinary incontinence    OSA on CPAP    uses intermittently   Phimosis    Prostate cancer [facility]) urologist-- dr winter   dx 02/ 2021;  localized advanced, gleason 4+4, PSA 317   Renal insufficiency    Sinus tachycardia    Thoracic aortic atherosclerosis (HCC)    Type 2 diabetes mellitus treated with insulin  (HCC)    followed by pcp   (05-24-2019  per stated checks blood sugar twice daily, fasting sugar-- 180-200)   Wears glasses     SURGICAL HISTORY: Past Surgical History:  Procedure Laterality Date   CIRCUMCISION N/A 05/29/2019   Procedure:  CIRCUMCISION ADULT;  Surgeon: Jonathan Homans, MD;  [facility]: [facility];  Service: Urology;  Laterality: N/A;   CYSTOSCOPY WITH URETHRAL DILATATION N/A 06/08/2021   Procedure: CYSTOSCOPY WITH URETHRAL DILATATION;  Surgeon: Jonathan Knapp, MD;  Location: [facility];  Service: Urology;  Laterality: N/A;   INTERSTIM IMPLANT PLACEMENT N/A 03/06/2023   Procedure: Percutaneous Temmporary Placement for Peripheral Nerve Evaluation;  Surgeon: Jonathan Hayward, MD;  Location: [facility];  Service: Urology;  Laterality: N/A;   INTERSTIM IMPLANT PLACEMENT N/A 04/10/2023   Procedure: Jonathan Austin IMPLANT FIRST STAGE WITH IMPEDENCE CHECK;  Surgeon: Jonathan Hayward, MD;  Location: [facility];  Service: Urology;  Laterality: N/A;   INTERSTIM IMPLANT PLACEMENT N/A 04/10/2023   Procedure: INSERTION, SACRAL NERVE STIMULATOR, INTERSTIM, STAGE 2;  Surgeon: Jonathan Hayward, MD;  Location: [facility];  Service: Urology;  Laterality: N/A;   PROSTATE BIOPSY     TONSILLECTOMY  child   TRANSURETHRAL RESECTION OF PROSTATE N/A 05/29/2019   Procedure: TRANSURETHRAL RESECTION OF THE PROSTATE (TURP)/ CYSTOSCOPY;  Surgeon: Jonathan Homans, MD;  Location: [facility];  Service: Urology;  Laterality: N/A;    SOCIAL HISTORY: Social History   Socioeconomic History   Marital status: Married    Spouse name: Jonathan Austin   Number of children: 1   Years of education: 12   Highest education level: GED or equivalent  Occupational History   Not on file  Tobacco Use   Smoking status: Never    Passive exposure: Never   Smokeless tobacco: Never  Vaping Use   Vaping status: Never Used  Substance and Sexual Activity   Alcohol use: Not Currently    Alcohol/week: 2.0 standard drinks of alcohol    Types: 2 Cans of beer per week   Drug use: Not Currently    Types: Cocaine, Marijuana    Comment: smokes crack cocaine 2023   Sexual activity: Not Currently  Other Topics Concern   Not on file  Social History Narrative   Only child is deceased   Social Drivers of Corporate investment banker Strain: Not on file  Food Insecurity: No Food Insecurity (04/11/2022)   Hunger Vital Sign    Worried About Running Out of Food in the Last Year: Never true    Ran Out of Food in the Last Year: Never true  Transportation Needs: No Transportation Needs (04/11/2022)   PRAPARE - Administrator, Civil Service (Medical): No    Lack of Transportation (Non-Medical): No   Physical Activity: Not on file  Stress: Not on file  Social Connections: Not on file  Intimate Partner Violence: Not At Risk (04/11/2022)   Humiliation, Afraid, Rape, and Kick questionnaire    Fear of Current or Ex-Partner: No    Emotionally Abused: No    Physically Abused: No    Sexually Abused: No    FAMILY HISTORY: Family History  Problem Relation Age of Onset   Hypertension Mother    Hypertension Father    Diabetes Father    Prostate cancer Maternal Grandfather 49   Breast cancer Neg Hx    Colon cancer Neg Hx    Pancreatic cancer Neg Hx     ALLERGIES:  has no known allergies.  MEDICATIONS:  Current Outpatient Medications  Medication Sig Dispense Refill   abiraterone  acetate (ZYTIGA ) 250 MG tablet Take 4 tablets (1,000 mg total) by mouth daily. Take on an empty stomach 1 hour before or 2 hours after a meal 120 tablet  0   ACCU-CHEK GUIDE test strip USE UP TO 4 TIMES A DAY AS DIRECTED     Accu-Chek Softclix Lancets lancets SMARTSIG:Topical 1-4 Times Daily     acetaminophen  (TYLENOL ) 500 MG tablet Take 500 mg by mouth every 6 (six) hours as needed (pain.).     AMBULATORY NON FORMULARY MEDICATION Super Trimix (30/1/50)-(Pap/Phent/PGE)  Test Dose  1ml vial   Qty #3 Refills 0  [facility] [phone] Fax [phone] 3 vial 0   amLODipine  (NORVASC ) 5 MG tablet Take 1 tablet (5 mg total) by mouth daily. 90 tablet 1   blood glucose meter kit and supplies Dispense based on patient and insurance preference. Use up to four times daily as directed. (FOR ICD-10 E10.9, E11.9). 1 each 0   Continuous Glucose Sensor (DEXCOM G7 SENSOR) MISC See admin instructions.     HUMALOG  MIX 75/25 KWIKPEN (75-25) 100 UNIT/ML KwikPen Inject 25 Units into the skin in the morning and at bedtime.     Insulin  Pen Needle 31G X 5 MM MISC 1 Needle by Does not apply route 2 (two) times daily. 100 each 0   oxyCODONE -acetaminophen  (PERCOCET) 5-325 MG tablet Take 1-2 tablets by mouth every 8 (eight)  hours as needed for severe pain (pain score 7-10). 10 tablet 0   OZEMPIC, 0.25 OR 0.5 MG/DOSE, 2 MG/3ML SOPN Inject 0.25 mg as directed every Wednesday.     prednisoLONE 5 MG TABS tablet Take by mouth.     predniSONE  (DELTASONE ) 5 MG tablet Take 1 tablet (5 mg total) by mouth daily with breakfast. 90 tablet 1   rosuvastatin  (CRESTOR ) 20 MG tablet Take 20 mg by mouth in the morning.     No current facility-administered medications for this visit.    REVIEW OF SYSTEMS:   Constitutional: ( - ) fevers, ( - )  chills , ( - ) night sweats Eyes: ( - ) blurriness of vision, ( - ) double vision, ( - ) watery eyes Ears, nose, mouth, throat, and face: ( - ) mucositis, ( - ) sore throat Respiratory: ( - ) cough, ( - ) dyspnea, ( - ) wheezes Cardiovascular: ( - ) palpitation, ( - ) chest discomfort, ( - ) lower extremity swelling Gastrointestinal:  ( - ) nausea, ( - ) heartburn, ( - ) change in bowel habits Skin: ( - ) abnormal skin rashes Lymphatics: ( - ) new lymphadenopathy, ( - ) easy bruising Neurological: ( - ) numbness, ( - ) tingling, ( - ) new weaknesses Behavioral/Psych: ( - ) mood change, ( - ) new changes  All other systems were reviewed with the patient and are negative.  PHYSICAL EXAMINATION:  Vitals:   07/10/23 1149  BP: (!) 145/93  Pulse: 85  Resp: 14  Temp: 97.9 F (36.6 C)  SpO2: 96%   Filed Weights   07/10/23 1149  Weight: 233 lb 12.8 oz (106.1 kg)    GENERAL: Well-appearing middle-aged African-American male, alert, no distress and comfortable SKIN: skin color, texture, turgor are normal, no rashes or significant lesions EYES: conjunctiva are pink and non-injected, sclera clear LUNGS: clear to auscultation and percussion with normal breathing effort HEART: regular rate & rhythm and no murmurs and no lower extremity edema Musculoskeletal: no cyanosis of digits and no clubbing  PSYCH: alert & oriented x 3, fluent speech NEURO: no focal motor/sensory  deficits  LABORATORY DATA:  I have reviewed the data as listed    Latest Ref Rng & Units 07/10/2023  11:18 AM 04/18/2023    9:58 AM 01/23/2023   10:45 AM  CBC  WBC 4.0 - 10.5 K/uL 4.4  4.2  4.5   Hemoglobin 13.0 - 17.0 g/dL 14.7  82.9  56.2   Hematocrit 39.0 - 52.0 % 33.7  34.5  36.7   Platelets 150 - 400 K/uL 308  303  326        Latest Ref Rng & Units 07/10/2023   11:18 AM 04/18/2023    9:58 AM 01/23/2023   10:45 AM  CMP  Glucose 70 - 99 mg/dL 73  130  79   BUN 6 - 20 mg/dL 15  15  11    Creatinine 0.61 - 1.24 mg/dL 8.65  7.84  6.96   Sodium 135 - 145 mmol/L 140  134  139   Potassium 3.5 - 5.1 mmol/L 3.8  4.2  3.9   Chloride 98 - 111 mmol/L 106  102  107   CO2 22 - 32 mmol/L 26  28  29    Calcium  8.9 - 10.3 mg/dL 9.4  8.7  9.5   Total Protein 6.5 - 8.1 g/dL 7.3  6.8  7.1   Total Bilirubin 0.0 - 1.2 mg/dL 0.4  0.4  0.3   Alkaline Phos 38 - 126 U/L 72  73  61   AST 15 - 41 U/L 21  13  24    ALT 0 - 44 U/L 22  18  22     RADIOGRAPHIC STUDIES: No results found.   ASSESSMENT & PLAN Jonathan Austin 58 y.o. male with medical history significant for metastatic castrate sensitive prostate cancer who presents for a follow up visit.  After review of the labs, review of the records, and discussion with the patient the patients findings are most consistent with castrate sensitive prostate cancer with metastatic spread to the lymph nodes.   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 First dose on 08/08/2022. Dose to be administered today. Next due Sept 2025 -- labs today show WBC 4.4, Hgb 11.7, MCV 85.1, Plt 308  --Last PSA in March 2025 was less than 0.1.  Repeated today. --RTC q 12-week visits to coincide with his Lupron  shots.   #Polysubstance Abuse -- patient drinks 6  pack of beer daily  -- patient smokes 1 g of crack cocaine daily -- discussed cutting back/cessation of substances today     No orders of the defined types were placed in this encounter.   All questions were answered. The patient knows to call the clinic with any problems, questions or concerns.  A total of more than 30 minutes were spent on this encounter with face-to-face time and non-face-to-face time, including preparing to see the patient, ordering tests and/or medications, counseling the patient and coordination of care as outlined above.   Rogerio Clay, MD Department of Hematology/Oncology [facility] Phone: [phone] Pager: [phone] Email: [email or web address]  07/10/2023 4:20 PM

## 2023-07-10 ENCOUNTER — Inpatient Hospital Stay: Payer: MEDICAID | Attending: Hematology and Oncology

## 2023-07-10 ENCOUNTER — Inpatient Hospital Stay (HOSPITAL_BASED_OUTPATIENT_CLINIC_OR_DEPARTMENT_OTHER): Payer: MEDICAID | Admitting: Hematology and Oncology

## 2023-07-10 ENCOUNTER — Inpatient Hospital Stay: Payer: MEDICAID

## 2023-07-10 VITALS — BP 145/93 | HR 85 | Temp 97.9°F | Resp 14 | Wt 233.8 lb

## 2023-07-10 VITALS — BP 135/82 | HR 87 | Temp 98.2°F | Resp 20

## 2023-07-10 DIAGNOSIS — C61 Malignant neoplasm of prostate: Secondary | ICD-10-CM | POA: Insufficient documentation

## 2023-07-10 DIAGNOSIS — Z79899 Other long term (current) drug therapy: Secondary | ICD-10-CM | POA: Insufficient documentation

## 2023-07-10 DIAGNOSIS — F1410 Cocaine abuse, uncomplicated: Secondary | ICD-10-CM | POA: Insufficient documentation

## 2023-07-10 DIAGNOSIS — Z8042 Family history of malignant neoplasm of prostate: Secondary | ICD-10-CM | POA: Diagnosis not present

## 2023-07-10 DIAGNOSIS — C772 Secondary and unspecified malignant neoplasm of intra-abdominal lymph nodes: Secondary | ICD-10-CM | POA: Diagnosis present

## 2023-07-10 DIAGNOSIS — Z7952 Long term (current) use of systemic steroids: Secondary | ICD-10-CM | POA: Diagnosis not present

## 2023-07-10 DIAGNOSIS — F1010 Alcohol abuse, uncomplicated: Secondary | ICD-10-CM | POA: Diagnosis not present

## 2023-07-10 LAB — CBC WITH DIFFERENTIAL (CANCER CENTER ONLY)
Abs Immature Granulocytes: 0.02 10*3/uL (ref 0.00–0.07)
Basophils Absolute: 0 10*3/uL (ref 0.0–0.1)
Basophils Relative: 1 %
Eosinophils Absolute: 0.2 10*3/uL (ref 0.0–0.5)
Eosinophils Relative: 4 %
HCT: 33.7 % — ABNORMAL LOW (ref 39.0–52.0)
Hemoglobin: 11.7 g/dL — ABNORMAL LOW (ref 13.0–17.0)
Immature Granulocytes: 1 %
Lymphocytes Relative: 38 %
Lymphs Abs: 1.7 10*3/uL (ref 0.7–4.0)
MCH: 29.5 pg (ref 26.0–34.0)
MCHC: 34.7 g/dL (ref 30.0–36.0)
MCV: 85.1 fL (ref 80.0–100.0)
Monocytes Absolute: 0.6 10*3/uL (ref 0.1–1.0)
Monocytes Relative: 13 %
Neutro Abs: 2 10*3/uL (ref 1.7–7.7)
Neutrophils Relative %: 43 %
Platelet Count: 308 10*3/uL (ref 150–400)
RBC: 3.96 MIL/uL — ABNORMAL LOW (ref 4.22–5.81)
RDW: 13.2 % (ref 11.5–15.5)
WBC Count: 4.4 10*3/uL (ref 4.0–10.5)
nRBC: 0 % (ref 0.0–0.2)

## 2023-07-10 LAB — CMP (CANCER CENTER ONLY)
ALT: 22 U/L (ref 0–44)
AST: 21 U/L (ref 15–41)
Albumin: 4.2 g/dL (ref 3.5–5.0)
Alkaline Phosphatase: 72 U/L (ref 38–126)
Anion gap: 8 (ref 5–15)
BUN: 15 mg/dL (ref 6–20)
CO2: 26 mmol/L (ref 22–32)
Calcium: 9.4 mg/dL (ref 8.9–10.3)
Chloride: 106 mmol/L (ref 98–111)
Creatinine: 0.91 mg/dL (ref 0.61–1.24)
GFR, Estimated: >60 mL/min (ref >60)
Glucose, Bld: 73 mg/dL (ref 70–99)
Potassium: 3.8 mmol/L (ref 3.5–5.1)
Sodium: 140 mmol/L (ref 135–145)
Total Bilirubin: 0.4 mg/dL (ref 0.0–1.2)
Total Protein: 7.3 g/dL (ref 6.5–8.1)

## 2023-07-10 MED ORDER — LEUPROLIDE ACETATE (3 MONTH) 22.5 MG IM KIT
22.5000 mg | PACK | Freq: Once | INTRAMUSCULAR | Status: AC
  Administered 2023-07-10: 22.5 mg via INTRAMUSCULAR
  Filled 2023-07-10: qty 22.5

## 2023-07-12 LAB — PROSTATE-SPECIFIC AG, SERUM (LABCORP): Prostate Specific Ag, Serum: <0.1 ng/mL (ref 0.0–4.0)

## 2023-07-12 LAB — TESTOSTERONE: Testosterone: <3 ng/dL — ABNORMAL LOW (ref 264–916)

## 2023-07-13 ENCOUNTER — Other Ambulatory Visit: Payer: Self-pay

## 2023-07-13 ENCOUNTER — Other Ambulatory Visit: Payer: Self-pay | Admitting: Hematology and Oncology

## 2023-07-13 NOTE — Progress Notes (Signed)
 Specialty Pharmacy Refill Coordination Note  Jonathan Austin is a 58 y.o. male contacted today regarding refills of specialty medication(s) Abiraterone  Acetate (ZYTIGA )   Patient requested Delivery   Delivery date: 07/17/23   Verified address: [address]   [place] [address]   Medication will be filled on 07/14/23. This fill date is pending response to refill request from provider. Patient is aware and if they have not received fill by intended date they must follow up with pharmacy.

## 2023-07-14 ENCOUNTER — Other Ambulatory Visit: Payer: Self-pay

## 2023-07-14 MED ORDER — ABIRATERONE ACETATE 250 MG PO TABS
1000.0000 mg | ORAL_TABLET | Freq: Every day | ORAL | 0 refills | Status: DC
  Filled 2023-07-14: qty 120, 30d supply, fill #0

## 2023-07-14 NOTE — Progress Notes (Signed)
 Called & spoke with patient about new delivery date 6/10 due to Rx came over at 3:19pm

## 2023-07-17 ENCOUNTER — Other Ambulatory Visit: Payer: Self-pay

## 2023-07-18 ENCOUNTER — Other Ambulatory Visit (HOSPITAL_COMMUNITY): Payer: Self-pay

## 2023-07-19 ENCOUNTER — Ambulatory Visit: Payer: Self-pay | Admitting: *Deleted

## 2023-07-19 ENCOUNTER — Other Ambulatory Visit: Payer: Self-pay | Admitting: Hematology and Oncology

## 2023-07-19 NOTE — Telephone Encounter (Signed)
-----   Message from Rogerio Clay IV sent at 07/19/2023  8:37 AM EDT ----- Please let Mr. Fok know that his testosterone  and PSA remain undetectable.  His treatment is working excellent.  Will plan to see him back in 3 months as scheduled. ----- Message ----- From: Dannis Dy, Lab [facility] [place] Sent: 07/10/2023  11:28 AM EDT To: Ander Bame, MD

## 2023-07-19 NOTE — Telephone Encounter (Signed)
 TCT patient regarding recent lab results.  Spoke with him. Advised that his testosterone  and PSA remain undetectable. His treatment is working excellent. Will plan to see him back in 3 months as scheduled. Pt pleased with results. He is aware of his appts in August 2025

## 2023-08-01 ENCOUNTER — Other Ambulatory Visit: Payer: Self-pay

## 2023-08-04 ENCOUNTER — Other Ambulatory Visit: Payer: Self-pay | Admitting: Hematology and Oncology

## 2023-08-04 ENCOUNTER — Other Ambulatory Visit (HOSPITAL_COMMUNITY): Payer: Self-pay

## 2023-08-07 ENCOUNTER — Other Ambulatory Visit: Payer: Self-pay

## 2023-08-07 ENCOUNTER — Other Ambulatory Visit (HOSPITAL_COMMUNITY): Payer: Self-pay

## 2023-08-07 ENCOUNTER — Other Ambulatory Visit: Payer: Self-pay | Admitting: Hematology and Oncology

## 2023-08-07 MED ORDER — ABIRATERONE ACETATE 250 MG PO TABS
1000.0000 mg | ORAL_TABLET | Freq: Every day | ORAL | 0 refills | Status: DC
  Filled 2023-08-07 – 2023-08-08 (×2): qty 120, 30d supply, fill #0

## 2023-08-07 NOTE — Progress Notes (Signed)
 A user error has taken place: encounter opened in error, closed for administrative reasons.

## 2023-08-08 ENCOUNTER — Other Ambulatory Visit: Payer: Self-pay

## 2023-08-08 NOTE — Progress Notes (Signed)
 Specialty Pharmacy Refill Coordination Note  Jonathan Austin is a 58 y.o. male contacted today regarding refills of specialty medication(s) Abiraterone  Acetate (ZYTIGA )   Patient requested Delivery   Delivery date: 08/09/23   Verified address: [address]   [place] [address]   Medication will be filled on 08/08/23.

## 2023-08-14 ENCOUNTER — Ambulatory Visit (INDEPENDENT_AMBULATORY_CARE_PROVIDER_SITE_OTHER): Payer: MEDICAID | Admitting: Urology

## 2023-08-14 VITALS — BP 157/93 | HR 90

## 2023-08-14 DIAGNOSIS — N3946 Mixed incontinence: Secondary | ICD-10-CM

## 2023-08-14 DIAGNOSIS — N3941 Urge incontinence: Secondary | ICD-10-CM

## 2023-08-14 NOTE — Progress Notes (Signed)
 08/14/2023 9:00 AM   Jonathan Austin [birth year] [id number]  Referring provider: Leonel Cole, MD [address] [place],  [place] [address]  Chief Complaint  Patient presents with   Follow-up    HPI: Patient had surgery April 10, 2023 with placement of InterStim.  Surgery went very well.  He has complex incontinence and a done beautifully with a peripheral nerve evaluation.  He has had good days and bad days in the first week but does not know how to utilize the device.   Incisions look excellent.  Patient will see our extender in the next 2 weeks for training on amplitude and programming I will reassess in 4 months.  I am very hopeful that this device will help him   Today Patient seen our extender twice for programming.  Trimix prescribed for rectal dysfunction Patient is significantly better.  He is very pleased.  Much less incontinence.  Frequency improved.  No infections.  No side effects programmed the device    PMH: Past Medical History:  Diagnosis Date   Alcohol use disorder, severe, dependence (HCC)    05-24-2019 recent [facility] admission for suicide ideation and alcohol / cocaine detox 05-04-2019 in epic,  pt stated has been taking naltrexone  daily as prescribed since and no alcohol since   Cocaine abuse (HCC)    05-24-2019  pt had recent [facility] admission, pt stated last used  crack 05-03-2019   Cocaine abuse with cocaine-induced mood disorder (HCC)    Depression    Eustachian tube dysfunction, bilateral    GERD (gastroesophageal reflux disease)    History of suicidal ideation    multiple [facility] admission's;  last one admitted 05-04-2019   Hyperlipidemia    Hyperplasia of prostate with lower urinary tract symptoms (LUTS)    Hypertension    followed by pcp   Mixed stress and urge urinary incontinence    OSA on CPAP    uses intermittently   Phimosis    Prostate cancer [facility]) urologist-- dr winter   dx 02/ 2021;  localized advanced, gleason 4+4, PSA 317    Renal insufficiency    Sinus tachycardia    Thoracic aortic atherosclerosis (HCC)    Type 2 diabetes mellitus treated with insulin  (HCC)    followed by pcp   (05-24-2019  per stated checks blood sugar twice daily, fasting sugar-- 180-200)   Wears glasses     Surgical History: Past Surgical History:  Procedure Laterality Date   CIRCUMCISION N/A 05/29/2019   Procedure: CIRCUMCISION ADULT;  Surgeon: Devere Lonni Righter, MD;  Location: [facility];  Service: Urology;  Laterality: N/A;   CYSTOSCOPY WITH URETHRAL DILATATION N/A 06/08/2021   Procedure: CYSTOSCOPY WITH URETHRAL DILATATION;  Surgeon: Twylla Glendia BROCKS, MD;  Location: [facility];  Service: Urology;  Laterality: N/A;   INTERSTIM IMPLANT PLACEMENT N/A 03/06/2023   Procedure: Percutaneous Temmporary Placement for Peripheral Nerve Evaluation;  Surgeon: Gaston Glendia, MD;  Location: [facility];  Service: Urology;  Laterality: N/A;   INTERSTIM IMPLANT PLACEMENT N/A 04/10/2023   Procedure: RENNA IMPLANT FIRST STAGE WITH IMPEDENCE CHECK;  Surgeon: Gaston Glendia, MD;  Location: [facility];  Service: Urology;  Laterality: N/A;   INTERSTIM IMPLANT PLACEMENT N/A 04/10/2023   Procedure: INSERTION, SACRAL NERVE STIMULATOR, INTERSTIM, STAGE 2;  Surgeon: Gaston Glendia, MD;  Location: [facility];  Service: Urology;  Laterality: N/A;   PROSTATE BIOPSY     TONSILLECTOMY  child   TRANSURETHRAL RESECTION OF PROSTATE N/A 05/29/2019  Procedure: TRANSURETHRAL RESECTION OF THE PROSTATE (TURP)/ CYSTOSCOPY;  Surgeon: Devere Lonni Righter, MD;  Location: [facility];  Service: Urology;  Laterality: N/A;    Home Medications:  Allergies as of 08/14/2023   No Known Allergies      Medication List        Accurate as of August 14, 2023  9:00 AM. If you have any questions, ask your nurse or doctor.          abiraterone  acetate 250 MG tablet Commonly known as: ZYTIGA  Take 4 tablets (1,000 mg total) by mouth daily.  Take on an empty stomach 1 hour before or 2 hours after a meal   Accu-Chek Guide test strip Generic drug: glucose blood USE UP TO 4 TIMES A DAY AS DIRECTED   Accu-Chek Softclix Lancets lancets SMARTSIG:Topical 1-4 Times Daily   acetaminophen  500 MG tablet Commonly known as: TYLENOL  Take 500 mg by mouth every 6 (six) hours as needed (pain.).   AMBULATORY NON FORMULARY MEDICATION Super Trimix (30/1/50)-(Pap/Phent/PGE)  Test Dose  1ml vial   Qty #3 Refills 0  [facility] [phone] Fax [phone]   amLODipine  5 MG tablet Commonly known as: NORVASC  TAKE 1 TABLET (5 MG TOTAL) BY MOUTH DAILY.   blood glucose meter kit and supplies Dispense based on patient and insurance preference. Use up to four times daily as directed. (FOR ICD-10 E10.9, E11.9).   Dexcom G7 Sensor Misc See admin instructions.   HumaLOG  Mix 75/25 KwikPen (75-25) 100 UNIT/ML KwikPen Generic drug: Insulin  Lispro Prot & Lispro Inject 25 Units into the skin in the morning and at bedtime.   Insulin  Pen Needle 31G X 5 MM Misc 1 Needle by Does not apply route 2 (two) times daily.   oxyCODONE -acetaminophen  5-325 MG tablet Commonly known as: Percocet Take 1-2 tablets by mouth every 8 (eight) hours as needed for severe pain (pain score 7-10).   Ozempic (0.25 or 0.5 MG/DOSE) 2 MG/3ML Sopn Generic drug: Semaglutide(0.25 or 0.5MG /DOS) Inject 0.25 mg as directed every Wednesday.   prednisoLONE 5 MG Tabs tablet Take by mouth.   predniSONE  5 MG tablet Commonly known as: DELTASONE  Take 1 tablet (5 mg total) by mouth daily with breakfast.   rosuvastatin  20 MG tablet Commonly known as: CRESTOR  Take 20 mg by mouth in the morning.        Allergies: No Known Allergies  Family History: Family History  Problem Relation Age of Onset   Hypertension Mother    Hypertension Father    Diabetes Father    Prostate cancer Maternal Grandfather 14   Breast cancer Neg Hx    Colon cancer Neg Hx     Pancreatic cancer Neg Hx     Social History:  reports that he has never smoked. He has never been exposed to tobacco smoke. He has never used smokeless tobacco. He reports that he does not currently use alcohol after a past usage of about 2.0 standard drinks of alcohol per week. He reports that he does not currently use drugs after having used the following drugs: Cocaine and Marijuana.  ROS:                                        Physical Exam: There were no vitals taken for this visit.  Constitutional:  Alert and oriented, No acute distress. HEENT: [place] AT, moist mucus membranes.  Trachea midline, no  masses.  Laboratory Data: Lab Results  Component Value Date   WBC 4.4 07/10/2023   HGB 11.7 (L) 07/10/2023   HCT 33.7 (L) 07/10/2023   MCV 85.1 07/10/2023   PLT 308 07/10/2023    Lab Results  Component Value Date   CREATININE 0.91 07/10/2023    No results found for: PSA  Lab Results  Component Value Date   TESTOSTERONE  <3 (L) 07/10/2023    Lab Results  Component Value Date   HGBA1C 11.8 (H) 04/11/2022    Urinalysis    Component Value Date/Time   COLORURINE YELLOW 09/16/2022 1702   APPEARANCEUR Clear 04/17/2023 0850   LABSPEC 1.025 09/16/2022 1702   PHURINE 6.0 09/16/2022 1702   GLUCOSEU Negative 04/17/2023 0850   HGBUR SMALL (A) 09/16/2022 1702   BILIRUBINUR Negative 04/17/2023 0850   KETONESUR NEGATIVE 09/16/2022 1702   PROTEINUR Negative 04/17/2023 0850   PROTEINUR NEGATIVE 09/16/2022 1702   UROBILINOGEN 0.2 12/17/2014 1649   NITRITE Negative 04/17/2023 0850   NITRITE NEGATIVE 09/16/2022 1702   LEUKOCYTESUR Negative 04/17/2023 0850   LEUKOCYTESUR TRACE (A) 09/16/2022 1702    Pertinent Imaging:   Assessment & Plan: Reassess 1 year  1. Mixed stress and urge urinary incontinence (Primary)  - Urinalysis, Complete   No follow-ups on file.  Glendia DELENA Elizabeth, MD  [facility] [address] [place], [place] [address] [phone]

## 2023-08-16 ENCOUNTER — Ambulatory Visit: Payer: MEDICAID | Admitting: Urology

## 2023-08-30 ENCOUNTER — Ambulatory Visit (INDEPENDENT_AMBULATORY_CARE_PROVIDER_SITE_OTHER): Payer: MEDICAID | Admitting: Podiatry

## 2023-08-30 ENCOUNTER — Encounter: Payer: Self-pay | Admitting: Podiatry

## 2023-08-30 DIAGNOSIS — M79675 Pain in left toe(s): Secondary | ICD-10-CM

## 2023-08-30 DIAGNOSIS — E1142 Type 2 diabetes mellitus with diabetic polyneuropathy: Secondary | ICD-10-CM

## 2023-08-30 DIAGNOSIS — B351 Tinea unguium: Secondary | ICD-10-CM

## 2023-08-30 DIAGNOSIS — M79674 Pain in right toe(s): Secondary | ICD-10-CM

## 2023-08-30 NOTE — Progress Notes (Signed)
This patient returns to my office for at risk foot care.  This patient requires this care by a professional since this patient will be at risk due to having diabetes.  This patient is unable to cut nails himself since the patient cannot reach his nails.These nails are painful walking and wearing shoes.  He presents to the office with male caregiver.This patient presents for at risk foot care today.  General Appearance  Alert, conversant and in no acute stress.  Vascular  Dorsalis pedis and posterior tibial  pulses are palpable  bilaterally.  Capillary return is within normal limits  bilaterally. Temperature is within normal limits  bilaterally.  Neurologic  Senn-Weinstein monofilament wire test diminished  bilaterally. Muscle power within normal limits bilaterally.  Nails Thick disfigured discolored nails with subungual debris  from hallux to fifth toes bilaterally. No evidence of bacterial infection or drainage bilaterally.  Orthopedic  No limitations of motion  feet .  No crepitus or effusions noted.  No bony pathology or digital deformities noted.  Skin  normotropic skin with no porokeratosis noted bilaterally.  No signs of infections or ulcers noted.     Onychomycosis  Pain in right toes  Pain in left toes  Consent was obtained for treatment procedures.   Mechanical debridement of nails 1-5  bilaterally performed with a nail nipper.  Filed with dremel without incident.    Return office visit     3 months                Told patient to return for periodic foot care and evaluation due to potential at risk complications.   Helane Gunther DPM

## 2023-09-01 ENCOUNTER — Other Ambulatory Visit (HOSPITAL_COMMUNITY): Payer: Self-pay

## 2023-09-05 ENCOUNTER — Other Ambulatory Visit (HOSPITAL_COMMUNITY): Payer: Self-pay

## 2023-09-05 ENCOUNTER — Other Ambulatory Visit: Payer: Self-pay | Admitting: Hematology and Oncology

## 2023-09-05 ENCOUNTER — Other Ambulatory Visit: Payer: Self-pay

## 2023-09-05 MED ORDER — ABIRATERONE ACETATE 250 MG PO TABS
1000.0000 mg | ORAL_TABLET | Freq: Every day | ORAL | 0 refills | Status: DC
  Filled 2023-09-05 – 2023-09-07 (×4): qty 120, 30d supply, fill #0

## 2023-09-07 ENCOUNTER — Other Ambulatory Visit: Payer: Self-pay | Admitting: Pharmacy Technician

## 2023-09-07 ENCOUNTER — Other Ambulatory Visit: Payer: Self-pay

## 2023-09-07 NOTE — Progress Notes (Signed)
 Specialty Pharmacy Refill Coordination Note  Jonathan Austin is a 58 y.o. male contacted today regarding refills of specialty medication(s) Abiraterone  Acetate (ZYTIGA )   Patient requested Delivery   Delivery date: 09/08/23   Verified address: [address]   [place] [address]   Medication will be filled on 09/07/23. Spoke to patient's spouse Donia.   Per note pls [facility] acct.

## 2023-09-17 NOTE — Progress Notes (Deleted)
 09/17/2023 8:18 PM  Jonathan Austin [birth year] [id number]   Referring provider: Leonel Cole, MD [address] [place],  [place] [address]  Urological history: 1. Ed - failed PDE5i's - failed Trimix (30/1/10)   2. High risk prostate cancer - PSA (04/2023) <0.1 - Zytiga  1000 mg  - followed by Dr. Federico  3. Urinary incontinence - InterStim (04/2023)   4. Bulbar/prostatic urethral stricture - dialation (2023)    No chief complaint on file.   HPI: Jonathan Austin is a 58 y.o. male who presents today for Super Trimix titration.    Previous records reviewed.     He has been experiencing issues with ED for ***.   He is having difficulty with achieving and maintaining erections.  ***  He is no longer having nocturnal tumescence or having morning erections.  *** He reports persistent ED despite the use of PDE5i's.  ***  No history of priapism, Peyronie's disease or penile trauma.  ***   Physical Exam:  There were no vitals taken for this visit.  Constitutional:  Well nourished. Alert and oriented, No acute distress. GU: No CVA tenderness.  No bladder fullness or masses.  Patient with circumcised/uncircumcised phallus. ***Foreskin easily retracted***  Urethral meatus is patent.  No penile discharge. No penile lesions or rashes.  Psychiatric: Normal mood and affect.   Procedure *** Patient's left corpus cavernosum is identified.  An area near the base of the penis is cleansed with rubbing alcohol.  Careful to avoid the dorsal vein, 2 mcg of Trimix (papaverine 30 mg, phentolamine 1 mg and prostaglandin E1 10 mcg, Lot # ***@*** exp # *** is injected at a 90 degree angle into the left *** corpus cavernosum near the base of the penis.  Patient experienced a very firm erection in 15 minutes.    Patient's right corpus cavernosum is identified.  An area near the base of the penis is cleansed with rubbing alcohol.  Careful to avoid the dorsal vein,  2 mcg of Trimix  (papaverine 30 mg, phentolamine 1 mg and prostaglandin E1 10 mcg, Lot # ***@*** exp # *** is injected at a 90 degree angle into the right corpus cavernosum near the base of the penis.  Patient experienced a very firm erection in 15 minutes.    Patient's left corpus cavernosum is identified.  An area near the base of the penis is cleansed with rubbing alcohol.  Careful to avoid the dorsal vein, 2 mcg of Trimix (papaverine 30 mg, phentolamine 1 mg and prostaglandin E1 10 mcg, Lot # ***@*** exp # *** is injected at a 90 degree angle into the left *** corpus cavernosum near the base of the penis.  Patient experienced a very firm erection in 15 minutes.    Patient's right corpus cavernosum is identified.  An area near the base of the penis is cleansed with rubbing alcohol.  Careful to avoid the dorsal vein, 2 mcg of Trimix (papaverine 30 mg, phentolamine 1 mg and prostaglandin E1 10 mcg, Lot # ***@*** exp # *** is injected at a 90 degree angle into the right corpus cavernosum near the base of the penis.  Patient experienced a very firm erection in 15 minutes.     Assessment & Plan:    1.  Erectile dysfunction - taught proper injection technique, including sterile handling, correct anatomical location and dosing - advised to use no more than once in 48-72 hours; instructed to seek care if erection persists beyond  4 hours   2. Prostate cancer - encourage continued follow up with Dr. Federico  3. Incontinence - encouraged continued follow up with Sam and Dr. MacDiarmid     No follow-ups on file.  Clotilda Cornwall, PA-C   [facility] [address] [place], [place] [address] [phone]  Time Spent: Total time spent on the day of the encounter to include pre-visit record review, face-to-face time with the patient, and post-visit ordering of tests.  *** minutes.

## 2023-09-19 ENCOUNTER — Ambulatory Visit: Payer: MEDICAID | Admitting: Urology

## 2023-09-19 DIAGNOSIS — N529 Male erectile dysfunction, unspecified: Secondary | ICD-10-CM

## 2023-09-19 DIAGNOSIS — C61 Malignant neoplasm of prostate: Secondary | ICD-10-CM

## 2023-09-19 DIAGNOSIS — N3941 Urge incontinence: Secondary | ICD-10-CM

## 2023-09-29 ENCOUNTER — Other Ambulatory Visit (HOSPITAL_COMMUNITY): Payer: Self-pay

## 2023-10-02 ENCOUNTER — Other Ambulatory Visit: Payer: Self-pay | Admitting: Hematology and Oncology

## 2023-10-02 ENCOUNTER — Other Ambulatory Visit: Payer: Self-pay

## 2023-10-02 MED ORDER — ABIRATERONE ACETATE 250 MG PO TABS
1000.0000 mg | ORAL_TABLET | Freq: Every day | ORAL | 0 refills | Status: DC
  Filled 2023-10-02: qty 120, 30d supply, fill #0

## 2023-10-02 NOTE — Progress Notes (Signed)
 Specialty Pharmacy Refill Coordination Note  Jonathan Austin is a 58 y.o. male contacted today regarding refills of specialty medication(s) Abiraterone  Acetate (ZYTIGA )  Spoke with patient's wife.  Patient requested Delivery   Delivery date: 10/10/23   Verified address: [address]   [place] [address]   Medication will be filled on 10/06/23.   This fill date is pending response to refill request from provider. Patient is aware and if they have not received fill by intended date they must follow up with pharmacy.

## 2023-10-03 ENCOUNTER — Other Ambulatory Visit: Payer: Self-pay

## 2023-10-03 ENCOUNTER — Inpatient Hospital Stay: Payer: MEDICAID | Attending: Hematology and Oncology

## 2023-10-03 ENCOUNTER — Inpatient Hospital Stay (HOSPITAL_BASED_OUTPATIENT_CLINIC_OR_DEPARTMENT_OTHER): Payer: MEDICAID | Admitting: Hematology and Oncology

## 2023-10-03 ENCOUNTER — Inpatient Hospital Stay: Payer: MEDICAID

## 2023-10-03 VITALS — BP 142/82 | HR 72 | Temp 97.8°F | Resp 13 | Wt 227.2 lb

## 2023-10-03 DIAGNOSIS — C61 Malignant neoplasm of prostate: Secondary | ICD-10-CM | POA: Diagnosis not present

## 2023-10-03 DIAGNOSIS — Z8042 Family history of malignant neoplasm of prostate: Secondary | ICD-10-CM | POA: Diagnosis not present

## 2023-10-03 DIAGNOSIS — R232 Flushing: Secondary | ICD-10-CM | POA: Insufficient documentation

## 2023-10-03 DIAGNOSIS — F1410 Cocaine abuse, uncomplicated: Secondary | ICD-10-CM | POA: Insufficient documentation

## 2023-10-03 DIAGNOSIS — F1010 Alcohol abuse, uncomplicated: Secondary | ICD-10-CM | POA: Diagnosis not present

## 2023-10-03 DIAGNOSIS — Z7952 Long term (current) use of systemic steroids: Secondary | ICD-10-CM | POA: Diagnosis not present

## 2023-10-03 DIAGNOSIS — Z79899 Other long term (current) drug therapy: Secondary | ICD-10-CM | POA: Diagnosis not present

## 2023-10-03 DIAGNOSIS — C772 Secondary and unspecified malignant neoplasm of intra-abdominal lymph nodes: Secondary | ICD-10-CM | POA: Insufficient documentation

## 2023-10-03 LAB — CMP (CANCER CENTER ONLY)
ALT: 20 U/L (ref 0–44)
AST: 19 U/L (ref 15–41)
Albumin: 4.2 g/dL (ref 3.5–5.0)
Alkaline Phosphatase: 61 U/L (ref 38–126)
Anion gap: 7 (ref 5–15)
BUN: 17 mg/dL (ref 6–20)
CO2: 26 mmol/L (ref 22–32)
Calcium: 9.4 mg/dL (ref 8.9–10.3)
Chloride: 106 mmol/L (ref 98–111)
Creatinine: 0.96 mg/dL (ref 0.61–1.24)
GFR, Estimated: >60 mL/min (ref >60)
Glucose, Bld: 115 mg/dL — ABNORMAL HIGH (ref 70–99)
Potassium: 4.6 mmol/L (ref 3.5–5.1)
Sodium: 139 mmol/L (ref 135–145)
Total Bilirubin: 0.5 mg/dL (ref 0.0–1.2)
Total Protein: 7.3 g/dL (ref 6.5–8.1)

## 2023-10-03 LAB — CBC WITH DIFFERENTIAL (CANCER CENTER ONLY)
Abs Immature Granulocytes: 0.02 K/uL (ref 0.00–0.07)
Basophils Absolute: 0 K/uL (ref 0.0–0.1)
Basophils Relative: 1 %
Eosinophils Absolute: 0 K/uL (ref 0.0–0.5)
Eosinophils Relative: 1 %
HCT: 34.2 % — ABNORMAL LOW (ref 39.0–52.0)
Hemoglobin: 11.5 g/dL — ABNORMAL LOW (ref 13.0–17.0)
Immature Granulocytes: 1 %
Lymphocytes Relative: 23 %
Lymphs Abs: 0.9 K/uL (ref 0.7–4.0)
MCH: 28.8 pg (ref 26.0–34.0)
MCHC: 33.6 g/dL (ref 30.0–36.0)
MCV: 85.5 fL (ref 80.0–100.0)
Monocytes Absolute: 0.2 K/uL (ref 0.1–1.0)
Monocytes Relative: 6 %
Neutro Abs: 2.8 K/uL (ref 1.7–7.7)
Neutrophils Relative %: 68 %
Platelet Count: 311 K/uL (ref 150–400)
RBC: 4 MIL/uL — ABNORMAL LOW (ref 4.22–5.81)
RDW: 13.4 % (ref 11.5–15.5)
WBC Count: 4 K/uL (ref 4.0–10.5)
nRBC: 0 % (ref 0.0–0.2)

## 2023-10-03 MED ORDER — LEUPROLIDE ACETATE (3 MONTH) 22.5 MG IM KIT
22.5000 mg | PACK | Freq: Once | INTRAMUSCULAR | Status: AC
  Administered 2023-10-03: 22.5 mg via INTRAMUSCULAR
  Filled 2023-10-03: qty 22.5

## 2023-10-03 NOTE — Progress Notes (Signed)
 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 Gleason 4+4 adenocarcinoma 05/2019: TURP with Jonathan Austin 10/22/2019: completed IMRT 05/10/2022: CT abdomen/pelvis w/ contrast showed stable exam. No evidence of metastatic disease within the abdomen or pelvis. 05/19/2022: NM PET (PSMA) showed focus of radiotracer accumulation in posterior prostatic apex, consistent with recurrent prostate carcinoma as well as sub-centimeter abdominal retroperitoneal lymph nodes show radiotracer accumulation, consistent with lymph node metastases 08/01/2022: establish care with Dr. Federico.  Started Zytiga  1000 mg p.o. daily with prednisone  5 mg p.o. daily  Interval History:  Jonathan Austin 58 y.o. male with medical history significant for metastatic castrate sensitive prostate cancer who presents for a follow up visit. The patient's last visit was on 07/10/2023. In the interim since the last visit he has had no major changes in his health and continues on his Zytiga  pills.  On exam today Jonathan Austin reports he has been having a good summer.  He has been staying local but is a trip planned for [place]  in October.  He reports overall has been feeling well with strong energy levels.  He reports he has lost 11 pounds on his current diet and reports he is cut back on sweets and meats.  He reports he does not have a particular weight goal in mind but is trying to get healthy.  He said no issues with runny nose, sore throat, cough.  He reports his appetite is strong and he is not having any major side effects as result of his Lupron  shots.  He reports he is taking his Zytiga  pills as prescribed 4 pills a day with the  steroids.  He notes that he will be needing refills soon.  He notes that also he has been off alcohol and illicit substances for over 1 year at this point.  He quit cold [place].  Otherwise he does have some occasional hot flashes and sweats but tolerating treatment well.  He is willing and able to continue treatment at this time.  Full 10 point ROS is otherwise negative.  MEDICAL HISTORY:  Past Medical History:  Diagnosis Date   Alcohol use disorder, severe, dependence (HCC)    05-24-2019 recent [facility] admission for suicide ideation and alcohol / cocaine detox 05-04-2019 in epic,  pt stated has been taking naltrexone  daily as prescribed since and no alcohol since   Cocaine abuse (HCC)    05-24-2019  pt had recent [facility] admission, pt stated last used  crack 05-03-2019   Cocaine abuse with cocaine-induced mood disorder (HCC)    Depression    Eustachian tube dysfunction, bilateral    GERD (gastroesophageal reflux disease)    History of suicidal ideation    multiple [facility] admission's;  last one admitted 05-04-2019   Hyperlipidemia    Hyperplasia of prostate with lower urinary tract symptoms (LUTS)    Hypertension    followed by pcp   Mixed stress and urge urinary incontinence    OSA on CPAP    uses intermittently   Phimosis    Prostate cancer [facility]) urologist-- dr Austin   dx 02/ 2021;  localized advanced, gleason 4+4, PSA 317   Renal insufficiency    Sinus tachycardia    Thoracic aortic atherosclerosis (HCC)  Type 2 diabetes mellitus treated with insulin  (HCC)    followed by pcp   (05-24-2019  per stated checks blood sugar twice daily, fasting sugar-- 180-200)   Wears glasses     SURGICAL HISTORY: Past Surgical History:  Procedure Laterality Date   CIRCUMCISION N/A 05/29/2019   Procedure: CIRCUMCISION ADULT;  Surgeon: Jonathan Lonni Righter, MD;  [facility];  Service: Urology;  Laterality: N/A;   CYSTOSCOPY WITH URETHRAL DILATATION N/A 06/08/2021   Procedure:  CYSTOSCOPY WITH URETHRAL DILATATION;  Surgeon: Jonathan Austin BROCKS, MD;  Location: [facility];  Service: Urology;  Laterality: N/A;   INTERSTIM IMPLANT PLACEMENT N/A 03/06/2023   Procedure: Percutaneous Temmporary Placement for Peripheral Nerve Evaluation;  Surgeon: Jonathan Glendia, MD;  Location: [facility];  Service: Urology;  Laterality: N/A;   INTERSTIM IMPLANT PLACEMENT N/A 04/10/2023   Procedure: RENNA IMPLANT FIRST STAGE WITH IMPEDENCE CHECK;  Surgeon: Jonathan Glendia, MD;  Location: [facility];  Service: Urology;  Laterality: N/A;   INTERSTIM IMPLANT PLACEMENT N/A 04/10/2023   Procedure: INSERTION, SACRAL NERVE STIMULATOR, INTERSTIM, STAGE 2;  Surgeon: Jonathan Glendia, MD;  Location: [facility];  Service: Urology;  Laterality: N/A;   PROSTATE BIOPSY     TONSILLECTOMY  child   TRANSURETHRAL RESECTION OF PROSTATE N/A 05/29/2019   Procedure: TRANSURETHRAL RESECTION OF THE PROSTATE (TURP)/ CYSTOSCOPY;  Surgeon: Jonathan Lonni Righter, MD;  Location: [facility];  Service: Urology;  Laterality: N/A;    SOCIAL HISTORY: Social History   Socioeconomic History   Marital status: Married    Spouse name: Jonathan Austin   Number of children: 1   Years of education: 12   Highest education level: GED or equivalent  Occupational History   Not on file  Tobacco Use   Smoking status: Never    Passive exposure: Never   Smokeless tobacco: Never  Vaping Use   Vaping status: Never Used  Substance and Sexual Activity   Alcohol use: Not Currently    Alcohol/week: 2.0 standard drinks of alcohol    Types: 2 Cans of beer per week   Drug use: Not Currently    Types: Cocaine, Marijuana    Comment: smokes crack cocaine 2023   Sexual activity: Not Currently  Other Topics Concern   Not on file  Social History Narrative   Only child is deceased   Social Drivers of Corporate investment banker Strain: Not on file  Food Insecurity: No Food Insecurity (04/11/2022)   Hunger Vital Sign    Worried  About Running Out of Food in the Last Year: Never true    Ran Out of Food in the Last Year: Never true  Transportation Needs: No Transportation Needs (04/11/2022)   PRAPARE - Administrator, Civil Service (Medical): No    Lack of Transportation (Non-Medical): No  Physical Activity: Not on file  Stress: Not on file  Social Connections: Not on file  Intimate Partner Violence: Not At Risk (04/11/2022)   Humiliation, Afraid, Rape, and Kick questionnaire    Fear of Current or Ex-Partner: No    Emotionally Abused: No    Physically Abused: No    Sexually Abused: No    FAMILY HISTORY: Family History  Problem Relation Age of Onset   Hypertension Mother    Hypertension Father    Diabetes Father    Prostate cancer Maternal Grandfather 29   Breast cancer Neg Hx    Colon cancer Neg Hx    Pancreatic cancer Neg Hx  ALLERGIES:  has no known allergies.  MEDICATIONS:  Current Outpatient Medications  Medication Sig Dispense Refill   abiraterone  acetate (ZYTIGA ) 250 MG tablet Take 4 tablets (1,000 mg total) by mouth daily. Take on an empty stomach 1 hour before or 2 hours after a meal 120 tablet 0   ACCU-CHEK GUIDE test strip USE UP TO 4 TIMES A DAY AS DIRECTED     Accu-Chek Softclix Lancets lancets SMARTSIG:Topical 1-4 Times Daily     acetaminophen  (TYLENOL ) 500 MG tablet Take 500 mg by mouth every 6 (six) hours as needed (pain.).     AMBULATORY NON FORMULARY MEDICATION Super Trimix (30/1/50)-(Pap/Phent/PGE)  Test Dose  1ml vial   Qty #3 Refills 0  [facility] [phone] Fax [phone] 3 vial 0   amLODipine  (NORVASC ) 5 MG tablet TAKE 1 TABLET (5 MG TOTAL) BY MOUTH DAILY. 90 tablet 1   blood glucose meter kit and supplies Dispense based on patient and insurance preference. Use up to four times daily as directed. (FOR ICD-10 E10.9, E11.9). 1 each 0   Continuous Glucose Sensor (DEXCOM G7 SENSOR) MISC See admin instructions.     HUMALOG  MIX 75/25 KWIKPEN (75-25) 100  UNIT/ML KwikPen Inject 25 Units into the skin in the morning and at bedtime.     Insulin  Pen Needle 31G X 5 MM MISC 1 Needle by Does not apply route 2 (two) times daily. 100 each 0   oxyCODONE -acetaminophen  (PERCOCET) 5-325 MG tablet Take 1-2 tablets by mouth every 8 (eight) hours as needed for severe pain (pain score 7-10). 10 tablet 0   OZEMPIC, 0.25 OR 0.5 MG/DOSE, 2 MG/3ML SOPN Inject 0.25 mg as directed every Wednesday.     prednisoLONE 5 MG TABS tablet Take by mouth.     predniSONE  (DELTASONE ) 5 MG tablet Take 1 tablet (5 mg total) by mouth daily with breakfast. 90 tablet 1   rosuvastatin  (CRESTOR ) 20 MG tablet Take 20 mg by mouth in the morning.     No current facility-administered medications for this visit.    REVIEW OF SYSTEMS:   Constitutional: ( - ) fevers, ( - )  chills , ( - ) night sweats Eyes: ( - ) blurriness of vision, ( - ) double vision, ( - ) watery eyes Ears, nose, mouth, throat, and face: ( - ) mucositis, ( - ) sore throat Respiratory: ( - ) cough, ( - ) dyspnea, ( - ) wheezes Cardiovascular: ( - ) palpitation, ( - ) chest discomfort, ( - ) lower extremity swelling Gastrointestinal:  ( - ) nausea, ( - ) heartburn, ( - ) change in bowel habits Skin: ( - ) abnormal skin rashes Lymphatics: ( - ) new lymphadenopathy, ( - ) easy bruising Neurological: ( - ) numbness, ( - ) tingling, ( - ) new weaknesses Behavioral/Psych: ( - ) mood change, ( - ) new changes  All other systems were reviewed with the patient and are negative.  PHYSICAL EXAMINATION:  Vitals:   10/03/23 1119  BP: (!) 142/82  Pulse: 72  Resp: 13  Temp: 97.8 F (36.6 C)  SpO2: 99%    Filed Weights   10/03/23 1119  Weight: 227 lb 3.2 oz (103.1 kg)     GENERAL: Well-appearing middle-aged African-American male, alert, no distress and comfortable SKIN: skin color, texture, turgor are normal, no rashes or significant lesions EYES: conjunctiva are pink and non-injected, sclera clear LUNGS: clear to  auscultation and percussion with normal breathing effort HEART: regular rate &  rhythm and no murmurs and no lower extremity edema Musculoskeletal: no cyanosis of digits and no clubbing  PSYCH: alert & oriented x 3, fluent speech NEURO: no focal motor/sensory deficits  LABORATORY DATA:  I have reviewed the data as listed    Latest Ref Rng & Units 10/03/2023   10:50 AM 07/10/2023   11:18 AM 04/18/2023    9:58 AM  CBC  WBC 4.0 - 10.5 K/uL 4.0  4.4  4.2   Hemoglobin 13.0 - 17.0 g/dL 88.4  88.2  88.1   Hematocrit 39.0 - 52.0 % 34.2  33.7  34.5   Platelets 150 - 400 K/uL 311  308  303        Latest Ref Rng & Units 10/03/2023   10:50 AM 07/10/2023   11:18 AM 04/18/2023    9:58 AM  CMP  Glucose 70 - 99 mg/dL 884  73  765   BUN 6 - 20 mg/dL 17  15  15    Creatinine 0.61 - 1.24 mg/dL 9.03  9.08  8.89   Sodium 135 - 145 mmol/L 139  140  134   Potassium 3.5 - 5.1 mmol/L 4.6  3.8  4.2   Chloride 98 - 111 mmol/L 106  106  102   CO2 22 - 32 mmol/L 26  26  28    Calcium  8.9 - 10.3 mg/dL 9.4  9.4  8.7   Total Protein 6.5 - 8.1 g/dL 7.3  7.3  6.8   Total Bilirubin 0.0 - 1.2 mg/dL 0.5  0.4  0.4   Alkaline Phos 38 - 126 U/L 61  72  73   AST 15 - 41 U/L 19  21  13    ALT 0 - 44 U/L 20  22  18     RADIOGRAPHIC STUDIES: No results found.   ASSESSMENT & PLAN Jonathan Austin 58 y.o. male with medical history significant for metastatic castrate sensitive prostate cancer who presents for a follow up visit.  After review of the labs, review of the records, and discussion with the patient the patients findings are most consistent with castrate sensitive prostate cancer with metastatic spread to the lymph nodes.   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 First dose on 08/08/2022. Dose to be  administered today. Next due Sept 2025 (today)  -- labs today show WBC 4.0, Hgb 11.5, MCV 85.5, Plt 311  --Last PSA in June 2025 was less than 0.1.  Repeated today. --RTC q 12-week visits to coincide with his Lupron  shots.   #History for Polysubstance Abuse-- Currently not using -- patient used to drink daily and smoke crack cocaine  -- He has been clean for over 1 year at this point.    No orders of the defined types were placed in this encounter.   All questions were answered. The patient knows to call the clinic with any problems, questions or concerns.  A total of more than 30 minutes were spent on this encounter with face-to-face time and non-face-to-face time, including preparing to see the patient, ordering tests and/or medications, counseling the patient and coordination of care as outlined above.   Norleen IVAR Kidney, MD Department of Hematology/Oncology [facility] Phone: [phone] Pager: [phone] Email: [email or web address]  10/03/2023 1:06 PM

## 2023-10-04 LAB — PROSTATE-SPECIFIC AG, SERUM (LABCORP): Prostate Specific Ag, Serum: <0.1 ng/mL (ref 0.0–4.0)

## 2023-10-04 LAB — TESTOSTERONE: Testosterone: <3 ng/dL — ABNORMAL LOW (ref 264–916)

## 2023-10-05 ENCOUNTER — Other Ambulatory Visit: Payer: Self-pay

## 2023-10-06 ENCOUNTER — Ambulatory Visit: Payer: Self-pay | Admitting: *Deleted

## 2023-10-06 NOTE — Telephone Encounter (Signed)
-----   Message from Norleen ONEIDA Kidney IV sent at 10/05/2023  9:04 AM EDT ----- Please let Mr. Bordelon know that his PSA and testosterone  levels remain undetectable.  He is doing excellent on his treatment.  We will plan to see him back as scheduled in Nov 2025 and continue on  his current treatment. ----- Message ----- From: Rebecka, Lab [facility] [place] Sent: 10/03/2023  11:06 AM EDT To: Norleen ONEIDA Kidney [place], MD

## 2023-10-06 NOTE — Telephone Encounter (Signed)
 TCT patient regarding recent lab results. Spoke with him. Advised  that his PSA and testosterone  levels remain undetectable.  He is doing excellent on his treatment.  We will plan to see him back as scheduled in Nov 2025 and continue on  his current treatment. Pt voiced understanding.  He is aware of his future appts.

## 2023-10-27 ENCOUNTER — Other Ambulatory Visit: Payer: Self-pay

## 2023-10-27 ENCOUNTER — Other Ambulatory Visit: Payer: Self-pay | Admitting: Pharmacy Technician

## 2023-10-27 ENCOUNTER — Other Ambulatory Visit: Payer: Self-pay | Admitting: Hematology and Oncology

## 2023-10-27 MED ORDER — ABIRATERONE ACETATE 250 MG PO TABS
1000.0000 mg | ORAL_TABLET | Freq: Every day | ORAL | 0 refills | Status: DC
  Filled 2023-10-27: qty 120, 30d supply, fill #0

## 2023-10-27 MED ORDER — PREDNISONE 5 MG PO TABS
5.0000 mg | ORAL_TABLET | Freq: Every day | ORAL | 1 refills | Status: AC
  Filled 2023-10-27: qty 30, 30d supply, fill #0
  Filled 2023-11-23 – 2023-12-22 (×2): qty 30, 30d supply, fill #1
  Filled 2024-01-23: qty 30, 30d supply, fill #2
  Filled 2024-02-29: qty 30, 30d supply, fill #3

## 2023-10-27 NOTE — Progress Notes (Signed)
 Specialty Pharmacy Refill Coordination Note  LANSING SIGMON is a 58 y.o. male contacted today regarding refills of specialty medication(s) Abiraterone  Acetate (ZYTIGA )  Spoke with Wife  Patient requested Delivery   Delivery date: 11/01/23   Verified address: [address]  [place]   Medication will be filled on 10/31/23.   This fill date is pending response to refill request from provider. Patient is aware and if they have not received fill by intended date they must follow up with pharmacy.

## 2023-10-31 ENCOUNTER — Other Ambulatory Visit: Payer: Self-pay

## 2023-11-08 ENCOUNTER — Other Ambulatory Visit: Payer: Self-pay

## 2023-11-08 NOTE — Progress Notes (Signed)
 Specialty Pharmacy Ongoing Clinical Assessment Note  Jonathan Austin is a 58 y.o. male who is being followed by the specialty pharmacy service for RxSp Oncology   Patient's specialty medication(s) reviewed today: Abiraterone  Acetate (ZYTIGA )   Missed doses in the last 4 weeks: 0   Patient/Caregiver did not have any additional questions or concerns.   Therapeutic benefit summary: Patient is achieving benefit   Adverse events/side effects summary: No adverse events/side effects   Patient's therapy is appropriate to: Continue    Goals Addressed             This Visit's Progress    Slow Disease Progression   On track    Patient is on track. Patient will maintain adherence.  The most recent PSA was <0.1.          Follow up: 6 months  [facility]

## 2023-11-22 ENCOUNTER — Telehealth: Payer: Self-pay | Admitting: Pharmacist

## 2023-11-22 NOTE — Progress Notes (Signed)
   11/22/2023  Patient ID: Jonathan Austin Breeding, male   DOB: 1965/11/07, 58 y.o.   MRN: [id number]  Received message from Dr. Leonel regarding trying to get Ozempic coverage. Appeal was denied.   Tried submitting for Mounjaro and denied coverage as well.  Called patient and explained the above. Scheduled office visit with the patient for Monday 10/20 at 9AM to plug-in Dexcom reader and adjust insulin  as needed.     Aloysius Iori, PharmD [facility]  Phone Number: [phone]

## 2023-11-23 ENCOUNTER — Other Ambulatory Visit: Payer: Self-pay | Admitting: Hematology and Oncology

## 2023-11-23 ENCOUNTER — Other Ambulatory Visit: Payer: Self-pay

## 2023-11-23 MED ORDER — ABIRATERONE ACETATE 250 MG PO TABS
1000.0000 mg | ORAL_TABLET | Freq: Every day | ORAL | 0 refills | Status: DC
  Filled 2023-11-23 – 2023-11-27 (×2): qty 120, 30d supply, fill #0

## 2023-11-27 ENCOUNTER — Other Ambulatory Visit: Payer: Self-pay

## 2023-11-27 NOTE — Progress Notes (Signed)
 Specialty Pharmacy Refill Coordination Note  Jonathan Austin is a 58 y.o. male contacted today regarding refills of specialty medication(s) Abiraterone  Acetate (ZYTIGA )   Patient requested Delivery   Delivery date: 12/01/23   Verified address: [address]  [place]   Medication will be filled on 11/30/23.

## 2023-11-29 ENCOUNTER — Other Ambulatory Visit: Payer: Self-pay

## 2023-12-01 ENCOUNTER — Ambulatory Visit (INDEPENDENT_AMBULATORY_CARE_PROVIDER_SITE_OTHER): Payer: MEDICAID | Admitting: Podiatry

## 2023-12-01 ENCOUNTER — Encounter: Payer: Self-pay | Admitting: Podiatry

## 2023-12-01 DIAGNOSIS — M79674 Pain in right toe(s): Secondary | ICD-10-CM | POA: Diagnosis not present

## 2023-12-01 DIAGNOSIS — M79675 Pain in left toe(s): Secondary | ICD-10-CM | POA: Diagnosis not present

## 2023-12-01 DIAGNOSIS — E1142 Type 2 diabetes mellitus with diabetic polyneuropathy: Secondary | ICD-10-CM

## 2023-12-01 DIAGNOSIS — B351 Tinea unguium: Secondary | ICD-10-CM | POA: Diagnosis not present

## 2023-12-01 NOTE — Progress Notes (Signed)
This patient returns to my office for at risk foot care.  This patient requires this care by a professional since this patient will be at risk due to having diabetes.  This patient is unable to cut nails himself since the patient cannot reach his nails.These nails are painful walking and wearing shoes.  He presents to the office with male caregiver.This patient presents for at risk foot care today.  General Appearance  Alert, conversant and in no acute stress.  Vascular  Dorsalis pedis and posterior tibial  pulses are palpable  bilaterally.  Capillary return is within normal limits  bilaterally. Temperature is within normal limits  bilaterally.  Neurologic  Senn-Weinstein monofilament wire test diminished  bilaterally. Muscle power within normal limits bilaterally.  Nails Thick disfigured discolored nails with subungual debris  from hallux to fifth toes bilaterally. No evidence of bacterial infection or drainage bilaterally.  Orthopedic  No limitations of motion  feet .  No crepitus or effusions noted.  No bony pathology or digital deformities noted.  Skin  normotropic skin with no porokeratosis noted bilaterally.  No signs of infections or ulcers noted.     Onychomycosis  Pain in right toes  Pain in left toes  Consent was obtained for treatment procedures.   Mechanical debridement of nails 1-5  bilaterally performed with a nail nipper.  Filed with dremel without incident.    Return office visit     3 months                Told patient to return for periodic foot care and evaluation due to potential at risk complications.   Helane Gunther DPM

## 2023-12-04 ENCOUNTER — Other Ambulatory Visit: Payer: Self-pay

## 2023-12-04 ENCOUNTER — Ambulatory Visit
Admission: EM | Admit: 2023-12-04 | Discharge: 2023-12-04 | Disposition: A | Discharge status: Home / Self Care | Payer: MEDICAID | Attending: Student | Admitting: Student

## 2023-12-04 ENCOUNTER — Encounter: Payer: Self-pay | Admitting: *Deleted

## 2023-12-04 DIAGNOSIS — H66002 Acute suppurative otitis media without spontaneous rupture of ear drum, left ear: Secondary | ICD-10-CM

## 2023-12-04 DIAGNOSIS — H6121 Impacted cerumen, right ear: Secondary | ICD-10-CM

## 2023-12-04 MED ORDER — AMOXICILLIN-POT CLAVULANATE 875-125 MG PO TABS: 1.0000 | ORAL_TABLET | Freq: Two times a day (BID) | ORAL | 0 refills | Status: AC

## 2023-12-04 NOTE — ED Provider Notes (Signed)
 [facility]    CSN: [id number] Arrival date & time: 12/04/23  0946      History   Chief Complaint Chief Complaint  Patient presents with   Ear Fullness    HPI Jonathan Austin is a 58 y.o. male presenting with L decreased hearing and pain x3 days. H/o tympanostomy tubes as an adult. Denies allergic rhinitis or recent URI. Followed by [facility]  HPI  Past Medical History:  Diagnosis Date   Alcohol use disorder, severe, dependence (HCC)    05-24-2019 recent [facility] admission for suicide ideation and alcohol / cocaine detox 05-04-2019 in epic,  pt stated has been taking naltrexone  daily as prescribed since and no alcohol since   Cocaine abuse (HCC)    05-24-2019  pt had recent [facility] admission, pt stated last used  crack 05-03-2019   Cocaine abuse with cocaine-induced mood disorder (HCC)    Depression    Eustachian tube dysfunction, bilateral    GERD (gastroesophageal reflux disease)    History of suicidal ideation    multiple [facility] admission's;  last one admitted 05-04-2019   Hyperlipidemia    Hyperplasia of prostate with lower urinary tract symptoms (LUTS)    Hypertension    followed by pcp   Mixed stress and urge urinary incontinence    OSA on CPAP    uses intermittently   Phimosis    Prostate cancer [facility]) urologist-- dr winter   dx 02/ 2021;  localized advanced, gleason 4+4, PSA 317   Renal insufficiency    Sinus tachycardia    Thoracic aortic atherosclerosis    Type 2 diabetes mellitus treated with insulin  (HCC)    followed by pcp   (05-24-2019  per stated checks blood sugar twice daily, fasting sugar-- 180-200)   Wears glasses     Patient Active Problem List   Diagnosis Date Noted   MDD (major depressive disorder) 04/11/2022   Depression 09/24/2019   Malignant neoplasm of prostate (HCC) 04/30/2019   Polysubstance dependence including opioid drug with daily use (HCC) 03/16/2017   Cocaine use disorder, severe, dependence (HCC) 07/19/2016   Alcohol  use disorder, severe, dependence (HCC) 07/16/2016   Thoracic aortic atherosclerosis 05/09/2016   Major depressive disorder, recurrent severe without psychotic features (HCC) 02/21/2016   Renal insufficiency 01/20/2016   HTN (hypertension) 09/20/2015   HLD (hyperlipidemia) 09/20/2015   Sinus tachycardia 02/12/2014   Marijuana abuse 06/28/2013   Type 2 diabetes mellitus with polyneuropathy (HCC) 12/09/2012    Past Surgical History:  Procedure Laterality Date   CIRCUMCISION N/A 05/29/2019   Procedure: CIRCUMCISION ADULT;  Surgeon: Devere Lonni Righter, MD;  Location: [facility];  Service: Urology;  Laterality: N/A;   CYSTOSCOPY WITH URETHRAL DILATATION N/A 06/08/2021   Procedure: CYSTOSCOPY WITH URETHRAL DILATATION;  Surgeon: Twylla Glendia BROCKS, MD;  Location: [facility];  Service: Urology;  Laterality: N/A;   INTERSTIM IMPLANT PLACEMENT N/A 03/06/2023   Procedure: Percutaneous Temmporary Placement for Peripheral Nerve Evaluation;  Surgeon: Gaston Glendia, MD;  Location: [facility];  Service: Urology;  Laterality: N/A;   INTERSTIM IMPLANT PLACEMENT N/A 04/10/2023   Procedure: RENNA IMPLANT FIRST STAGE WITH IMPEDENCE CHECK;  Surgeon: Gaston Glendia, MD;  Location: [facility];  Service: Urology;  Laterality: N/A;   INTERSTIM IMPLANT PLACEMENT N/A 04/10/2023   Procedure: INSERTION, SACRAL NERVE STIMULATOR, INTERSTIM, STAGE 2;  Surgeon: Gaston Glendia, MD;  Location: [facility];  Service: Urology;  Laterality: N/A;   PROSTATE BIOPSY     TONSILLECTOMY  child  TRANSURETHRAL RESECTION OF PROSTATE N/A 05/29/2019   Procedure: TRANSURETHRAL RESECTION OF THE PROSTATE (TURP)/ CYSTOSCOPY;  Surgeon: Devere Lonni Righter, MD;  Location: [facility];  Service: Urology;  Laterality: N/A;       Home Medications    Prior to Admission medications   Medication Sig Start Date End Date Taking? Authorizing Provider  abiraterone  acetate (ZYTIGA ) 250 MG tablet Take 4  tablets (1,000 mg total) by mouth daily. Take on an empty stomach 1 hour before or 2 hours after a meal 11/23/23  Yes Federico Norleen DASEN IV, MD  amLODipine  (NORVASC ) 5 MG tablet TAKE 1 TABLET (5 MG TOTAL) BY MOUTH DAILY. 07/19/23  Yes Federico Norleen DASEN MADISON, MD  amoxicillin -clavulanate (AUGMENTIN ) 875-125 MG tablet Take 1 tablet by mouth every 12 (twelve) hours. 12/04/23  Yes Arlyss Leita BRAVO, PA-C  Continuous Glucose Sensor (DEXCOM G7 SENSOR) MISC See admin instructions. 02/09/23  Yes [provider]  HUMALOG  MIX 75/25 KWIKPEN (75-25) 100 UNIT/ML KwikPen Inject 25 Units into the skin in the morning and at bedtime. 02/06/23  Yes [provider]  OZEMPIC, 0.25 OR 0.5 MG/DOSE, 2 MG/3ML SOPN Inject 0.25 mg as directed every Wednesday.   Yes [provider]  predniSONE  (DELTASONE ) 5 MG tablet Take 1 tablet (5 mg total) by mouth daily with breakfast. 10/27/23  Yes Federico Norleen DASEN MADISON, MD  rosuvastatin  (CRESTOR ) 20 MG tablet Take 20 mg by mouth in the morning. 11/08/22  Yes [provider]  ACCU-CHEK GUIDE test strip USE UP TO 4 TIMES A DAY AS DIRECTED 05/05/22   [provider]  Accu-Chek Softclix Lancets lancets SMARTSIG:Topical 1-4 Times Daily 05/05/22   [provider]  acetaminophen  (TYLENOL ) 500 MG tablet Take 500 mg by mouth every 6 (six) hours as needed (pain.).    [provider]  AMBULATORY NON FORMULARY MEDICATION Super Trimix (30/1/50)-(Pap/Phent/PGE)  Test Dose  1ml vial   Qty #3 Refills 0  [facility] [phone] Fax [phone] Patient not taking: Reported on 12/04/2023 07/05/23   Maurine Lukes, PA-C  blood glucose meter kit and supplies Dispense based on patient and insurance preference. Use up to four times daily as directed. (FOR ICD-10 E10.9, E11.9). 03/03/22   Patsey Lot, MD  Insulin  Pen Needle 31G X 5 MM MISC 1 Needle by Does not apply route 2 (two) times daily. 03/03/22   Patsey Lot, MD   oxyCODONE -acetaminophen  (PERCOCET) 5-325 MG tablet Take 1-2 tablets by mouth every 8 (eight) hours as needed for severe pain (pain score 7-10). Patient not taking: Reported on 12/04/2023 04/10/23 04/09/24  MacDiarmid, Scott, MD  prednisoLONE 5 MG TABS tablet Take by mouth. Patient not taking: Reported on 12/04/2023    [provider]    Family History Family History  Problem Relation Age of Onset   Hypertension Mother    Hypertension Father    Diabetes Father    Prostate cancer Maternal Grandfather 18   Breast cancer Neg Hx    Colon cancer Neg Hx    Pancreatic cancer Neg Hx     Social History Social History   Tobacco Use   Smoking status: Never    Passive exposure: Never   Smokeless tobacco: Never  Vaping Use   Vaping status: Never Used  Substance Use Topics   Alcohol use: Not Currently    Alcohol/week: 2.0 standard drinks of alcohol    Types: 2 Cans of beer per week   Drug use: Not Currently    Types: Cocaine,  Marijuana    Comment: smokes crack cocaine 2023     Allergies   Patient has no known allergies.   Review of Systems Review of Systems  HENT:  Positive for ear pain and hearing loss.      Physical Exam Triage Vital Signs ED Triage Vitals  Encounter Vitals Group     BP 12/04/23 1125 (!) 147/89     Girls Systolic BP Percentile --      Girls Diastolic BP Percentile --      Boys Systolic BP Percentile --      Boys Diastolic BP Percentile --      Pulse Rate 12/04/23 1125 79     Resp 12/04/23 1125 18     Temp 12/04/23 1125 98.3 F (36.8 C)     Temp src --      SpO2 12/04/23 1125 100 %     Weight --      Height --      Head Circumference --      Peak Flow --      Pain Score 12/04/23 1121 5     Pain Loc --      Pain Education --      Exclude from Growth Chart --    No data found.  Updated Vital Signs BP (!) 147/89 (BP Location: Left Arm)   Pulse 79   Temp 98.3 F (36.8 C)   Resp 18   SpO2 100%   Visual Acuity Right Eye Distance:    Left Eye Distance:   Bilateral Distance:    Right Eye Near:   Left Eye Near:    Bilateral Near:     Physical Exam Vitals reviewed.  Constitutional:      Appearance: Normal appearance. He is not ill-appearing.  HENT:     Head: Normocephalic and atraumatic.     Right Ear: Hearing, tympanic membrane, ear canal and external ear normal. No swelling or tenderness. No middle ear effusion. There is impacted cerumen. No mastoid tenderness. Tympanic membrane is not injected, scarred, perforated, erythematous, retracted or bulging.     Left Ear: Hearing, ear canal and external ear normal. No swelling or tenderness.  No middle ear effusion. There is no impacted cerumen. No mastoid tenderness. Tympanic membrane is erythematous and bulging. Tympanic membrane is not injected, scarred, perforated or retracted.     Ears:     Comments: R TM initially fully occluded by cerumen.Following lavage, tympanostomy tube is visible. TM is otherwise healthy and intact. Canal is without erythema or exudate.   L TM is erythematous and bulging.    Mouth/Throat:     Pharynx: Oropharynx is clear. No oropharyngeal exudate or posterior oropharyngeal erythema.  Cardiovascular:     Rate and Rhythm: Normal rate and regular rhythm.     Heart sounds: Normal heart sounds.  Pulmonary:     Effort: Pulmonary effort is normal.     Breath sounds: Normal breath sounds.  Lymphadenopathy:     Cervical: No cervical adenopathy.  Neurological:     General: No focal deficit present.     Mental Status: He is alert and oriented to person, place, and time.  Psychiatric:        Mood and Affect: Mood normal.        Behavior: Behavior normal.        Thought Content: Thought content normal.        Judgment: Judgment normal.      UC Treatments / Results  Labs (all  labs ordered are listed, but only abnormal results are displayed) Labs Reviewed - No data to display  EKG   Radiology No results found.  Procedures Procedures  (including critical care time)  Medications Ordered in UC Medications - No data to display  Initial Impression / Assessment and Plan / UC Course  I have reviewed the triage vital signs and the nursing notes.  Pertinent labs & imaging results that were available during my care of the patient were reviewed by me and considered in my medical decision making (see chart for details).     Patient is a 58 year old male presenting with right ear cerumen impaction, and left ear otitis media.  Afebrile and nontachycardic.  Lavage performed for right ear.  Augmentin  sent for left ear.  Final Clinical Impressions(s) / UC Diagnoses   Final diagnoses:  Non-recurrent acute suppurative otitis media of left ear without spontaneous rupture of tympanic membrane  Impacted cerumen of right ear     Discharge Instructions      -Start the antibiotic-Augmentin  (amoxicillin -clavulanate), 1 pill every 12 hours for 7 days.  You can take this with food like with breakfast and dinner.    ED Prescriptions     Medication Sig Dispense Auth. Provider   amoxicillin -clavulanate (AUGMENTIN ) 875-125 MG tablet Take 1 tablet by mouth every 12 (twelve) hours. 14 tablet Pasqualina Colasurdo E, PA-C      PDMP not reviewed this encounter.   Arlyss Leita BRAVO, PA-C 12/04/23 1241

## 2023-12-04 NOTE — Discharge Instructions (Signed)
-  Start the antibiotic-Augmentin (amoxicillin-clavulanate), 1 pill every 12 hours for 7 days.  You can take this with food like with breakfast and dinner.  

## 2023-12-04 NOTE — ED Triage Notes (Addendum)
 Pt reports left ear pain and fullness for a few days. Thinks he may need to have his ear flushed. States he tried some OTC ear drops this morning

## 2023-12-22 ENCOUNTER — Other Ambulatory Visit: Payer: Self-pay

## 2023-12-22 ENCOUNTER — Other Ambulatory Visit: Payer: Self-pay | Admitting: Pharmacy Technician

## 2023-12-22 ENCOUNTER — Other Ambulatory Visit: Payer: Self-pay | Admitting: Hematology and Oncology

## 2023-12-22 MED ORDER — ABIRATERONE ACETATE 250 MG PO TABS
1000.0000 mg | ORAL_TABLET | Freq: Every day | ORAL | 0 refills | Status: DC
  Filled 2023-12-22: qty 120, 30d supply, fill #0

## 2023-12-22 NOTE — Progress Notes (Signed)
 Specialty Pharmacy Refill Coordination Note  Jonathan Austin is a 58 y.o. male contacted today regarding refills of specialty medication(s) Abiraterone  Acetate (ZYTIGA )  Spoke with Wife  Patient requested Delivery   Delivery date: 12/28/23   Verified address: [address]  [place]    Medication will be filled on: 12/27/23

## 2023-12-25 ENCOUNTER — Inpatient Hospital Stay: Payer: MEDICAID | Attending: Hematology and Oncology

## 2023-12-25 ENCOUNTER — Inpatient Hospital Stay: Payer: MEDICAID

## 2023-12-25 ENCOUNTER — Inpatient Hospital Stay: Payer: MEDICAID | Admitting: Hematology and Oncology

## 2023-12-25 DIAGNOSIS — Z79899 Other long term (current) drug therapy: Secondary | ICD-10-CM | POA: Insufficient documentation

## 2023-12-25 DIAGNOSIS — R232 Flushing: Secondary | ICD-10-CM | POA: Insufficient documentation

## 2023-12-25 DIAGNOSIS — F1410 Cocaine abuse, uncomplicated: Secondary | ICD-10-CM | POA: Insufficient documentation

## 2023-12-25 DIAGNOSIS — Z7952 Long term (current) use of systemic steroids: Secondary | ICD-10-CM | POA: Insufficient documentation

## 2023-12-25 DIAGNOSIS — Z8042 Family history of malignant neoplasm of prostate: Secondary | ICD-10-CM | POA: Insufficient documentation

## 2023-12-25 DIAGNOSIS — C772 Secondary and unspecified malignant neoplasm of intra-abdominal lymph nodes: Secondary | ICD-10-CM | POA: Insufficient documentation

## 2023-12-25 DIAGNOSIS — F1010 Alcohol abuse, uncomplicated: Secondary | ICD-10-CM | POA: Insufficient documentation

## 2023-12-25 DIAGNOSIS — C61 Malignant neoplasm of prostate: Secondary | ICD-10-CM | POA: Insufficient documentation

## 2023-12-25 NOTE — Progress Notes (Signed)
 No show-- injection and labs rescheduled.

## 2023-12-26 ENCOUNTER — Inpatient Hospital Stay: Payer: MEDICAID

## 2023-12-26 DIAGNOSIS — F1410 Cocaine abuse, uncomplicated: Secondary | ICD-10-CM | POA: Diagnosis not present

## 2023-12-26 DIAGNOSIS — Z8042 Family history of malignant neoplasm of prostate: Secondary | ICD-10-CM | POA: Diagnosis not present

## 2023-12-26 DIAGNOSIS — C772 Secondary and unspecified malignant neoplasm of intra-abdominal lymph nodes: Secondary | ICD-10-CM | POA: Diagnosis present

## 2023-12-26 DIAGNOSIS — C61 Malignant neoplasm of prostate: Secondary | ICD-10-CM

## 2023-12-26 DIAGNOSIS — Z79899 Other long term (current) drug therapy: Secondary | ICD-10-CM | POA: Diagnosis not present

## 2023-12-26 DIAGNOSIS — R232 Flushing: Secondary | ICD-10-CM | POA: Diagnosis not present

## 2023-12-26 DIAGNOSIS — Z7952 Long term (current) use of systemic steroids: Secondary | ICD-10-CM | POA: Diagnosis not present

## 2023-12-26 DIAGNOSIS — F1010 Alcohol abuse, uncomplicated: Secondary | ICD-10-CM | POA: Diagnosis not present

## 2023-12-26 LAB — CMP (CANCER CENTER ONLY)
ALT: 16 U/L (ref 0–44)
AST: 20 U/L (ref 15–41)
Albumin: 4.3 g/dL (ref 3.5–5.0)
Alkaline Phosphatase: 82 U/L (ref 38–126)
Anion gap: 11 (ref 5–15)
BUN: 13 mg/dL (ref 6–20)
CO2: 27 mmol/L (ref 22–32)
Calcium: 9.9 mg/dL (ref 8.9–10.3)
Chloride: 101 mmol/L (ref 98–111)
Creatinine: 1.08 mg/dL (ref 0.61–1.24)
GFR, Estimated: >60 mL/min (ref >60)
Glucose, Bld: 96 mg/dL (ref 70–99)
Potassium: 4.6 mmol/L (ref 3.5–5.1)
Sodium: 138 mmol/L (ref 135–145)
Total Bilirubin: 0.5 mg/dL (ref 0.0–1.2)
Total Protein: 7.6 g/dL (ref 6.5–8.1)

## 2023-12-26 LAB — CBC WITH DIFFERENTIAL (CANCER CENTER ONLY)
Abs Immature Granulocytes: 0.03 K/uL (ref 0.00–0.07)
Basophils Absolute: 0 K/uL (ref 0.0–0.1)
Basophils Relative: 1 %
Eosinophils Absolute: 0.1 K/uL (ref 0.0–0.5)
Eosinophils Relative: 1 %
HCT: 36.1 % — ABNORMAL LOW (ref 39.0–52.0)
Hemoglobin: 12.3 g/dL — ABNORMAL LOW (ref 13.0–17.0)
Immature Granulocytes: 0 %
Lymphocytes Relative: 23 %
Lymphs Abs: 1.9 K/uL (ref 0.7–4.0)
MCH: 28.9 pg (ref 26.0–34.0)
MCHC: 34.1 g/dL (ref 30.0–36.0)
MCV: 84.7 fL (ref 80.0–100.0)
Monocytes Absolute: 0.9 K/uL (ref 0.1–1.0)
Monocytes Relative: 11 %
Neutro Abs: 5.4 K/uL (ref 1.7–7.7)
Neutrophils Relative %: 64 %
Platelet Count: 316 K/uL (ref 150–400)
RBC: 4.26 MIL/uL (ref 4.22–5.81)
RDW: 13.7 % (ref 11.5–15.5)
WBC Count: 8.4 K/uL (ref 4.0–10.5)
nRBC: 0 % (ref 0.0–0.2)

## 2023-12-26 LAB — PSA: Prostatic Specific Antigen: <0.02 ng/mL (ref 0.00–4.00)

## 2023-12-26 MED ORDER — LEUPROLIDE ACETATE (3 MONTH) 22.5 MG IM KIT
22.5000 mg | PACK | Freq: Once | INTRAMUSCULAR | Status: AC
  Administered 2023-12-26: 22.5 mg via INTRAMUSCULAR
  Filled 2023-12-26: qty 22.5

## 2023-12-27 ENCOUNTER — Other Ambulatory Visit: Payer: Self-pay

## 2023-12-27 LAB — TESTOSTERONE: Testosterone: <3 ng/dL — ABNORMAL LOW (ref 264–916)

## 2024-01-02 ENCOUNTER — Encounter: Payer: Self-pay | Admitting: Hematology and Oncology

## 2024-01-08 ENCOUNTER — Telehealth: Payer: Self-pay | Admitting: Pharmacist

## 2024-01-08 NOTE — Progress Notes (Signed)
   01/08/2024  Patient ID: Jonathan Austin, male   DOB: [birth year], 58 y.o.   MRN: [id number]  Called and spoke with the patient on the phone briefly today. Reports he should still have 1 dose of Ozempic left at this time. Therefore, we still do NOT need to titrate the rest of his insulin  quite yet.  Reminded him of upcoming appointment on 12/17. Confirmed understanding. Will follow-up at the new year in 1 month to see how his readings have changed with lack of Ozempic.    Aloysius Gautham, PharmD, [facility] Phone Number: [phone]

## 2024-01-15 ENCOUNTER — Other Ambulatory Visit: Payer: Self-pay | Admitting: Hematology and Oncology

## 2024-01-22 ENCOUNTER — Other Ambulatory Visit: Payer: Self-pay

## 2024-01-22 ENCOUNTER — Other Ambulatory Visit: Payer: Self-pay | Admitting: Hematology and Oncology

## 2024-01-22 MED ORDER — ABIRATERONE ACETATE 250 MG PO TABS
1000.0000 mg | ORAL_TABLET | Freq: Every day | ORAL | 0 refills | Status: DC
  Filled 2024-01-22 – 2024-01-23 (×2): qty 120, 30d supply, fill #0

## 2024-01-23 ENCOUNTER — Other Ambulatory Visit (HOSPITAL_COMMUNITY): Payer: Self-pay

## 2024-01-23 ENCOUNTER — Other Ambulatory Visit: Payer: Self-pay

## 2024-01-23 NOTE — Progress Notes (Signed)
 Specialty Pharmacy Refill Coordination Note  Spoke with Deerman,Doris (Wife)  QUINTON VOTH is a 58 y.o. male contacted today regarding refills of specialty medication(s) Abiraterone  Acetate (ZYTIGA )  Doses on hand: 16 days  Patient requested: Delivery   Delivery date: 01/30/24   Verified address: [address] [place] [address]  Medication will be filled on 01/29/24  Also fill Prednisone 

## 2024-02-28 ENCOUNTER — Other Ambulatory Visit: Payer: Self-pay | Admitting: Hematology and Oncology

## 2024-02-28 ENCOUNTER — Other Ambulatory Visit: Payer: Self-pay

## 2024-02-28 MED ORDER — ABIRATERONE ACETATE 250 MG PO TABS
1000.0000 mg | ORAL_TABLET | Freq: Every day | ORAL | 0 refills | Status: AC
  Filled 2024-02-29 (×2): qty 120, 30d supply, fill #0

## 2024-02-29 ENCOUNTER — Other Ambulatory Visit: Payer: Self-pay | Admitting: Pharmacy Technician

## 2024-02-29 ENCOUNTER — Other Ambulatory Visit: Payer: Self-pay

## 2024-02-29 NOTE — Progress Notes (Signed)
 Specialty Pharmacy Refill Coordination Note  Jonathan Austin is a 59 y.o. male contacted today regarding refills of specialty medication(s) Abiraterone  Acetate (ZYTIGA )  Spoke with Wife  Patient requested Delivery   Delivery date: 03/07/24   Verified address: [address]  [place] [address]   Medication will be filled on: 03/06/24

## 2024-03-01 ENCOUNTER — Encounter: Payer: Self-pay | Admitting: Podiatry

## 2024-03-01 ENCOUNTER — Ambulatory Visit: Payer: MEDICAID | Admitting: Podiatry

## 2024-03-01 DIAGNOSIS — M79675 Pain in left toe(s): Secondary | ICD-10-CM

## 2024-03-01 DIAGNOSIS — B351 Tinea unguium: Secondary | ICD-10-CM | POA: Diagnosis not present

## 2024-03-01 DIAGNOSIS — E1142 Type 2 diabetes mellitus with diabetic polyneuropathy: Secondary | ICD-10-CM

## 2024-03-01 DIAGNOSIS — M79674 Pain in right toe(s): Secondary | ICD-10-CM

## 2024-03-01 NOTE — Progress Notes (Addendum)
This patient returns to my office for at risk foot care.  This patient requires this care by a professional since this patient will be at risk due to having diabetes.  This patient is unable to cut nails himself since the patient cannot reach his nails.These nails are painful walking and wearing shoes.  This patient presents for at risk foot care today.  General Appearance  Alert, conversant and in no acute stress.  Vascular  Dorsalis pedis and posterior tibial  pulses are palpable  bilaterally.  Capillary return is within normal limits  bilaterally. Temperature is within normal limits  bilaterally.  Neurologic  Senn-Weinstein monofilament wire test diminished  bilaterally. Muscle power within normal limits bilaterally.  Nails Thick disfigured discolored nails with subungual debris  from hallux to fifth toes bilaterally. No evidence of bacterial infection or drainage bilaterally.  Orthopedic  No limitations of motion  feet .  No crepitus or effusions noted.  No bony pathology or digital deformities noted.  Skin  normotropic skin with no porokeratosis noted bilaterally.  No signs of infections or ulcers noted.     Onychomycosis  Pain in right toes  Pain in left toes  Consent was obtained for treatment procedures.   Mechanical debridement of nails 1-5  bilaterally performed with a nail nipper.  Filed with dremel without incident.    Return office visit   3 months                   Told patient to return for periodic foot care and evaluation due to potential at risk complications.   Jonathan Austin DPM  

## 2024-03-06 ENCOUNTER — Other Ambulatory Visit: Payer: Self-pay

## 2024-03-18 ENCOUNTER — Inpatient Hospital Stay: Payer: MEDICAID | Admitting: Hematology and Oncology

## 2024-03-18 ENCOUNTER — Inpatient Hospital Stay: Payer: MEDICAID | Attending: Hematology and Oncology

## 2024-03-18 ENCOUNTER — Inpatient Hospital Stay: Payer: MEDICAID

## 2024-05-31 ENCOUNTER — Ambulatory Visit: Payer: MEDICAID | Admitting: Podiatry

## 2024-08-12 ENCOUNTER — Ambulatory Visit: Payer: MEDICAID | Admitting: Urology
